# Patient Record
Sex: Male | Born: 1950
Health system: Southern US, Community
[De-identification: ages and names within clinical notes are randomized; demographics above are authoritative.]

## PROBLEM LIST (undated history)

## (undated) DIAGNOSIS — C801 Malignant (primary) neoplasm, unspecified: Secondary | ICD-10-CM

## (undated) DIAGNOSIS — R3915 Urgency of urination: Secondary | ICD-10-CM

## (undated) DIAGNOSIS — Z9189 Other specified personal risk factors, not elsewhere classified: Secondary | ICD-10-CM

## (undated) DIAGNOSIS — Z85528 Personal history of other malignant neoplasm of kidney: Secondary | ICD-10-CM

## (undated) DIAGNOSIS — N329 Bladder disorder, unspecified: Secondary | ICD-10-CM

## (undated) DIAGNOSIS — Z789 Other specified health status: Secondary | ICD-10-CM

## (undated) DIAGNOSIS — R319 Hematuria, unspecified: Secondary | ICD-10-CM

## (undated) HISTORY — PX: TIBIA FRACTURE SURGERY: SHX806

## (undated) MED FILL — Dexamethasone Sodium Phosphate Inj 100 MG/10ML: INTRAMUSCULAR | Qty: 1 | Status: AC

## (undated) MED FILL — Pegfilgrastim-pbbk Soln Prefilled Syringe 6 MG/0.6ML: SUBCUTANEOUS | Qty: 0.6 | Status: AC

---

## 1999-03-29 ENCOUNTER — Encounter: Payer: Self-pay | Admitting: Orthopedic Surgery

## 1999-03-29 ENCOUNTER — Inpatient Hospital Stay (HOSPITAL_COMMUNITY): Admission: EM | Admit: 1999-03-29 | Discharge: 1999-03-31 | Payer: Self-pay

## 1999-03-29 ENCOUNTER — Encounter: Payer: Self-pay | Admitting: Emergency Medicine

## 1999-06-21 ENCOUNTER — Encounter: Payer: Self-pay | Admitting: Nephrology

## 1999-06-21 ENCOUNTER — Encounter: Admission: RE | Admit: 1999-06-21 | Discharge: 1999-06-21 | Payer: Self-pay | Admitting: Nephrology

## 1999-09-06 ENCOUNTER — Inpatient Hospital Stay (HOSPITAL_COMMUNITY): Admission: RE | Admit: 1999-09-06 | Discharge: 1999-09-09 | Payer: Self-pay | Admitting: Urology

## 1999-12-28 ENCOUNTER — Encounter: Payer: Self-pay | Admitting: Urology

## 1999-12-28 ENCOUNTER — Encounter: Admission: RE | Admit: 1999-12-28 | Discharge: 1999-12-28 | Payer: Self-pay | Admitting: Urology

## 2001-07-28 ENCOUNTER — Encounter: Payer: Self-pay | Admitting: Urology

## 2001-07-28 ENCOUNTER — Encounter: Admission: RE | Admit: 2001-07-28 | Discharge: 2001-07-28 | Payer: Self-pay | Admitting: Urology

## 2001-09-02 HISTORY — PX: PARTIAL NEPHRECTOMY: SHX414

## 2002-07-26 ENCOUNTER — Encounter: Payer: Self-pay | Admitting: Urology

## 2002-07-26 ENCOUNTER — Encounter: Admission: RE | Admit: 2002-07-26 | Discharge: 2002-07-26 | Payer: Self-pay | Admitting: Urology

## 2003-05-16 ENCOUNTER — Encounter: Payer: Self-pay | Admitting: General Surgery

## 2003-05-16 ENCOUNTER — Encounter: Admission: RE | Admit: 2003-05-16 | Discharge: 2003-05-16 | Payer: Self-pay | Admitting: General Surgery

## 2012-06-23 ENCOUNTER — Encounter (INDEPENDENT_AMBULATORY_CARE_PROVIDER_SITE_OTHER): Payer: Self-pay | Admitting: Surgery

## 2012-06-23 ENCOUNTER — Encounter (INDEPENDENT_AMBULATORY_CARE_PROVIDER_SITE_OTHER): Payer: Self-pay

## 2012-06-23 ENCOUNTER — Ambulatory Visit (INDEPENDENT_AMBULATORY_CARE_PROVIDER_SITE_OTHER): Payer: 59 | Admitting: Surgery

## 2012-06-23 VITALS — BP 124/80 | HR 78 | Temp 98.0°F | Resp 18 | Ht 72.0 in | Wt 213.2 lb

## 2012-06-23 DIAGNOSIS — K602 Anal fissure, unspecified: Secondary | ICD-10-CM

## 2012-06-23 DIAGNOSIS — K649 Unspecified hemorrhoids: Secondary | ICD-10-CM

## 2012-06-23 DIAGNOSIS — K6289 Other specified diseases of anus and rectum: Secondary | ICD-10-CM

## 2012-06-23 DIAGNOSIS — G8929 Other chronic pain: Secondary | ICD-10-CM

## 2012-06-23 MED ORDER — POLYETHYLENE GLYCOL 3350 17 GM/SCOOP PO POWD: 17.0000 g | Freq: Every day | ORAL | Status: DC

## 2012-06-23 MED ORDER — OXYCODONE-ACETAMINOPHEN 5-325 MG PO TABS: 1.0000 | ORAL_TABLET | ORAL | Status: DC | PRN

## 2012-06-23 NOTE — Progress Notes (Signed)
Patient ID: Danny Wood, male   DOB: 04/25/1951, 60 y.o.   MRN: [id number]  Chief Complaint  Patient presents with  . Follow-up    hems    HPI Danny Wood is a 60 y.o. male.  Patient sent at the request of Dr.Swain do to severe perianal pain for 5 months. Over last 2 weeks the pain in his anal canal is increased and is worse after bowel movements with bleeding after bowel movements. He has had external hemorrhoids in the past and this was thought to be the cause. He had pain starting 5 months ago made worse with bowel movements with associated bleeding which at times is dark and "fills the commode" he has had a colonoscopy in the last 5 years which he states was normal. He has severe pain with wiping. He states the area was black and blue over the weekend. No fever chills or foul-smelling drainage. HPI  No past medical history on file.  Past Surgical History  Procedure Date  . Kidney surgery 2003    benign tumor removed    No family history on file.  Social History History  Substance Use Topics  . Smoking status: Never Smoker   . Smokeless tobacco: Not on file  . Alcohol Use: Yes    No Known Allergies  Current Outpatient Prescriptions  Medication Sig Dispense Refill  . minocycline (MINOCIN,DYNACIN) 100 MG capsule Take 100 mg by mouth 2 (two) times daily.      . oxyCODONE-acetaminophen (ROXICET) 5-325 MG per tablet Take 1 tablet by mouth every 4 (four) hours as needed for pain.  30 tablet  0  . polyethylene glycol powder (GLYCOLAX) powder Take 17 g by mouth daily.  255 g  0    Review of Systems Review of Systems  Constitutional: Negative.   HENT: Negative.   Eyes: Negative.   Respiratory: Negative.   Gastrointestinal: Positive for constipation, blood in stool and anal bleeding.  Genitourinary: Negative.   Musculoskeletal: Negative.   Neurological: Negative.   Hematological: Negative.   Psychiatric/Behavioral: Negative.     Blood pressure 124/80,  pulse 78, temperature 98 F (36.7 C), temperature source Oral, resp. rate 18, height 6' (1.829 m), weight 213 lb 3.2 oz (96.707 kg).  Physical Exam Physical Exam  Constitutional: He is oriented to person, place, and time. He appears well-developed and well-nourished.  HENT:  Head: Normocephalic and atraumatic.  Eyes: EOM are normal. Pupils are equal, round, and reactive to light.  Neck: Normal range of motion. Neck supple.  Cardiovascular: Normal rate and regular rhythm.   Pulmonary/Chest: Effort normal and breath sounds normal.  Abdominal: Soft. Bowel sounds are normal.  Genitourinary:     Neurological: He is alert and oriented to person, place, and time.  Skin: Skin is warm and dry.  Psychiatric: His behavior is normal. Judgment and thought content normal. His mood appears anxious.      Assessment    Anal pain nonthrombosed external hemorrhoids Anal fissure    Plan    EUA Possible hemorrhoidectomy Possible Lateral internal sphincterotomy The procedure has been discussed with the patient.  Alternative therapies have been discussed with the patient.  Operative risks include bleeding,  Infection,  Organ injury,  Nerve injury,  Blood vessel injury,  DVT,  Pulmonary embolism,  Death,  And possible reoperation.  Medical management risks include worsening of present situation.  The success of the procedure is 50 -90 % at treating patients symptoms.  Possible fecal incontinence.  The   patient understands and agrees to proceed.       Liberato Stansbery A. 06/23/2012, 3:29 PM    

## 2012-06-23 NOTE — Patient Instructions (Addendum)
Apply gel as directed.  Soak in Epsom salts and warm water 2 - 3 times a day.  Miralax daily .       Anal Fissure, Adult An anal fissure is a small tear or crack in the skin around the anus. Bleeding from a fissure usually stops on its own within a few minutes. However, bleeding will often reoccur with each bowel movement until the crack heals.  CAUSES   Passing large, hard stools.  Frequent diarrheal stools.  Constipation.  Inflammatory bowel disease (Crohn's disease or ulcerative colitis).  Infections.  Anal sex. SYMPTOMS   Small amounts of blood seen on your stools, on toilet paper, or in the toilet after a bowel movement.  Rectal bleeding.  Painful bowel movements.  Itching or irritation around the anus. DIAGNOSIS Your caregiver will examine the anal area. An anal fissure can usually be seen with careful inspection. A rectal exam may be performed and a short tube (anoscope) may be used to examine the anal canal. TREATMENT   You may be instructed to take fiber supplements. These supplements can soften your stool to help make bowel movements easier.  Sitz baths may be recommended to help heal the tear. Do not use soap in the sitz baths.  A medicated cream or ointment may be prescribed to lessen discomfort. HOME CARE INSTRUCTIONS   Maintain a diet high in fruits, whole grains, and vegetables. Avoid constipating foods like bananas and dairy products.  Take sitz baths as directed by your caregiver.  Drink enough fluids to keep your urine clear or pale yellow.  Only take over-the-counter or prescription medicines for pain, discomfort, or fever as directed by your caregiver. Do not take aspirin as this may increase bleeding.  Do not use ointments containing numbing medications (anesthetics) or hydrocortisone. They could slow healing. SEEK MEDICAL CARE IF:   Your fissure is not completely healed within 3 days.  You have further bleeding.  You have a fever.  You  have diarrhea mixed with blood.  You have pain.  Your problem is getting worse rather than better. MAKE SURE YOU:   Understand these instructions.  Will watch your condition.  Will get help right away if you are not doing well or get worse. Document Released: 08/19/2005 Document Revised: 11/11/2011 Document Reviewed: 02/03/2011 [facility] Patient Information 2013 [place], [place].

## 2012-06-26 ENCOUNTER — Encounter (HOSPITAL_COMMUNITY): Payer: Self-pay | Admitting: Pharmacy Technician

## 2012-07-03 ENCOUNTER — Ambulatory Visit (HOSPITAL_COMMUNITY)
Admission: RE | Admit: 2012-07-03 | Discharge: 2012-07-03 | Disposition: A | Discharge status: Home / Self Care | Payer: 59 | Source: Ambulatory Visit | Attending: Surgery | Admitting: Surgery

## 2012-07-03 ENCOUNTER — Encounter (HOSPITAL_COMMUNITY): Payer: Self-pay

## 2012-07-03 ENCOUNTER — Encounter (HOSPITAL_COMMUNITY)
Admission: RE | Admit: 2012-07-03 | Discharge: 2012-07-03 | Disposition: A | Discharge status: Home / Self Care | Payer: 59 | Source: Ambulatory Visit | Attending: Surgery | Admitting: Surgery

## 2012-07-03 DIAGNOSIS — Z01818 Encounter for other preprocedural examination: Secondary | ICD-10-CM | POA: Insufficient documentation

## 2012-07-03 LAB — CBC WITH DIFFERENTIAL/PLATELET
Basophils Absolute: 0 10*3/uL (ref 0.0–0.1)
Basophils Relative: 0 % (ref 0–1)
Eosinophils Absolute: 0.3 10*3/uL (ref 0.0–0.7)
Eosinophils Relative: 3 % (ref 0–5)
HCT: 44.4 % (ref 39.0–52.0)
Hemoglobin: 16 g/dL (ref 13.0–17.0)
Lymphocytes Relative: 38 % (ref 12–46)
Lymphs Abs: 2.9 10*3/uL (ref 0.7–4.0)
MCH: 30.3 pg (ref 26.0–34.0)
MCHC: 36 g/dL (ref 30.0–36.0)
MCV: 84.1 fL (ref 78.0–100.0)
Monocytes Absolute: 0.7 10*3/uL (ref 0.1–1.0)
Monocytes Relative: 9 % (ref 3–12)
Neutro Abs: 3.7 10*3/uL (ref 1.7–7.7)
Neutrophils Relative %: 49 % (ref 43–77)
Platelets: 186 10*3/uL (ref 150–400)
RBC: 5.28 MIL/uL (ref 4.22–5.81)
RDW: 12.7 % (ref 11.5–15.5)
WBC: 7.5 10*3/uL (ref 4.0–10.5)

## 2012-07-03 LAB — COMPREHENSIVE METABOLIC PANEL
ALT: 45 U/L (ref 0–53)
AST: 35 U/L (ref 0–37)
Albumin: 3.8 g/dL (ref 3.5–5.2)
Alkaline Phosphatase: 86 U/L (ref 39–117)
BUN: 21 mg/dL (ref 6–23)
CO2: 29 mEq/L (ref 19–32)
Calcium: 9.1 mg/dL (ref 8.4–10.5)
Chloride: 102 mEq/L (ref 96–112)
Creatinine, Ser: 0.95 mg/dL (ref 0.50–1.35)
GFR calc Af Amer: >90 mL/min (ref >90)
GFR calc non Af Amer: 89 mL/min — ABNORMAL LOW (ref >90)
Glucose, Bld: 101 mg/dL — ABNORMAL HIGH (ref 70–99)
Potassium: 4.2 mEq/L (ref 3.5–5.1)
Sodium: 138 mEq/L (ref 135–145)
Total Bilirubin: 0.4 mg/dL (ref 0.3–1.2)
Total Protein: 7 g/dL (ref 6.0–8.3)

## 2012-07-03 LAB — SURGICAL PCR SCREEN
MRSA, PCR: NEGATIVE
Staphylococcus aureus: NEGATIVE

## 2012-07-03 NOTE — Pre-Procedure Instructions (Signed)
20 Danny Wood   07/03/2012   Your procedure is scheduled on:  Tuesday, November 5th   Report to [facility] at 2:00 PM  Call this number if you have problems the morning of surgery: [phone]   Remember:   Do not eat food or drink ANY liquids:After Midnight Monday.  Take these medicines the morning of surgery with A SIP OF WATER:  Roxicet   Do not wear jewelry.  Do not wear lotions, powders, or colognes . You may NOT wear deodorant.   Men may shave face and neck.   Do not bring valuables to the hospital.  Contacts, dentures or bridgework may not be worn into surgery.   Leave suitcase in the car. After surgery it may be brought to your room.  For patients admitted to the hospital, checkout time is 11:00 AM the day of discharge.   Patients discharged the day of surgery will not be allowed to drive home.   Name and phone number of your driver:    Special Instructions: Shower using CHG 2 nights before surgery and the night before surgery.  If you shower the day of surgery use CHG.  Use special wash - you have one bottle of CHG for all showers.  You should use approximately 1/3 of the bottle for each shower.   Please read over the following fact sheets that you were given: Pain Booklet, Coughing and Deep Breathing, MRSA Information and Surgical Site Infection Prevention

## 2012-07-06 MED ORDER — DEXTROSE 5 % IV SOLN
3.0000 g | INTRAVENOUS | Status: AC
  Administered 2012-07-07: 3 g via INTRAVENOUS
  Filled 2012-07-06: qty 3000

## 2012-07-07 ENCOUNTER — Encounter (HOSPITAL_COMMUNITY): Payer: Self-pay | Admitting: Anesthesiology

## 2012-07-07 ENCOUNTER — Encounter (HOSPITAL_COMMUNITY): Payer: Self-pay | Admitting: *Deleted

## 2012-07-07 ENCOUNTER — Encounter (HOSPITAL_COMMUNITY)
Admission: RE | Disposition: A | Discharge status: Home / Self Care | Payer: Self-pay | Source: Ambulatory Visit | Attending: Surgery

## 2012-07-07 ENCOUNTER — Ambulatory Visit (HOSPITAL_COMMUNITY): Payer: 59 | Admitting: Anesthesiology

## 2012-07-07 ENCOUNTER — Ambulatory Visit (HOSPITAL_COMMUNITY)
Admission: RE | Admit: 2012-07-07 | Discharge: 2012-07-07 | Disposition: A | Discharge status: Home / Self Care | Payer: 59 | Source: Ambulatory Visit | Attending: Surgery | Admitting: Surgery

## 2012-07-07 DIAGNOSIS — K5900 Constipation, unspecified: Secondary | ICD-10-CM | POA: Insufficient documentation

## 2012-07-07 DIAGNOSIS — Z9889 Other specified postprocedural states: Secondary | ICD-10-CM

## 2012-07-07 DIAGNOSIS — K644 Residual hemorrhoidal skin tags: Secondary | ICD-10-CM

## 2012-07-07 DIAGNOSIS — K648 Other hemorrhoids: Secondary | ICD-10-CM

## 2012-07-07 DIAGNOSIS — K602 Anal fissure, unspecified: Secondary | ICD-10-CM | POA: Insufficient documentation

## 2012-07-07 HISTORY — PX: EVALUATION UNDER ANESTHESIA WITH HEMORRHOIDECTOMY: SHX5624

## 2012-07-07 SURGERY — EXAM UNDER ANESTHESIA WITH HEMORRHOIDECTOMY
Anesthesia: General | Site: Rectum | Wound class: Clean Contaminated

## 2012-07-07 MED ORDER — HEMOSTATIC AGENTS (NO CHARGE) OPTIME
TOPICAL | Status: DC | PRN
  Administered 2012-07-07: 1 via TOPICAL

## 2012-07-07 MED ORDER — OXYCODONE HCL 5 MG PO TABS: 5.0000 mg | ORAL_TABLET | Freq: Once | ORAL | Status: DC | PRN

## 2012-07-07 MED ORDER — ACETAMINOPHEN 650 MG RE SUPP: 650.0000 mg | RECTAL | Status: DC | PRN

## 2012-07-07 MED ORDER — ONDANSETRON HCL 4 MG/2ML IJ SOLN
INTRAMUSCULAR | Status: DC | PRN
  Administered 2012-07-07: 4 mg via INTRAVENOUS

## 2012-07-07 MED ORDER — OXYCODONE-ACETAMINOPHEN 5-325 MG PO TABS: 2.0000 | ORAL_TABLET | ORAL | Status: DC | PRN

## 2012-07-07 MED ORDER — SODIUM CHLORIDE 0.9 % IV SOLN: 250.0000 mL | INTRAVENOUS | Status: DC | PRN

## 2012-07-07 MED ORDER — FENTANYL CITRATE 0.05 MG/ML IJ SOLN
INTRAMUSCULAR | Status: DC | PRN
  Administered 2012-07-07 (×5): 50 ug via INTRAVENOUS

## 2012-07-07 MED ORDER — PROMETHAZINE HCL 25 MG/ML IJ SOLN: 6.2500 mg | INTRAMUSCULAR | Status: DC | PRN

## 2012-07-07 MED ORDER — MIDAZOLAM HCL 5 MG/5ML IJ SOLN
INTRAMUSCULAR | Status: DC | PRN
  Administered 2012-07-07: 2 mg via INTRAVENOUS

## 2012-07-07 MED ORDER — LIDOCAINE HCL 2 % EX GEL
CUTANEOUS | Status: DC | PRN
  Administered 2012-07-07: 1 via TOPICAL

## 2012-07-07 MED ORDER — LIDOCAINE HCL 2 % EX GEL: CUTANEOUS | Status: DC | PRN

## 2012-07-07 MED ORDER — LIDOCAINE HCL 2 % EX GEL
CUTANEOUS | Status: AC
  Filled 2012-07-07: qty 20

## 2012-07-07 MED ORDER — LIDOCAINE HCL (CARDIAC) 20 MG/ML IV SOLN
INTRAVENOUS | Status: DC | PRN
  Administered 2012-07-07: 20 mg via INTRAVENOUS

## 2012-07-07 MED ORDER — SODIUM CHLORIDE 0.9 % IJ SOLN
INTRAMUSCULAR | Status: DC | PRN
  Administered 2012-07-07 (×2): 10 mL

## 2012-07-07 MED ORDER — SODIUM CHLORIDE 0.9 % IJ SOLN: 3.0000 mL | Freq: Two times a day (BID) | INTRAMUSCULAR | Status: DC

## 2012-07-07 MED ORDER — SODIUM CHLORIDE 0.9 % IJ SOLN: 3.0000 mL | INTRAMUSCULAR | Status: DC | PRN

## 2012-07-07 MED ORDER — BUPIVACAINE LIPOSOME 1.3 % IJ SUSP
20.0000 mL | Freq: Once | INTRAMUSCULAR | Status: AC
  Administered 2012-07-07: 40 mL
  Filled 2012-07-07: qty 20

## 2012-07-07 MED ORDER — MIDAZOLAM HCL 2 MG/2ML IJ SOLN: 0.5000 mg | Freq: Once | INTRAMUSCULAR | Status: DC | PRN

## 2012-07-07 MED ORDER — HYDROMORPHONE HCL PF 1 MG/ML IJ SOLN: 0.2500 mg | INTRAMUSCULAR | Status: DC | PRN

## 2012-07-07 MED ORDER — ACETAMINOPHEN 325 MG PO TABS: 650.0000 mg | ORAL_TABLET | ORAL | Status: DC | PRN

## 2012-07-07 MED ORDER — LACTATED RINGERS IV SOLN
INTRAVENOUS | Status: DC
  Administered 2012-07-07: 13:00:00 via INTRAVENOUS

## 2012-07-07 MED ORDER — CHLORHEXIDINE GLUCONATE 4 % EX LIQD: 1.0000 "application " | Freq: Once | CUTANEOUS | Status: DC

## 2012-07-07 MED ORDER — MEPERIDINE HCL 25 MG/ML IJ SOLN: 6.2500 mg | INTRAMUSCULAR | Status: DC | PRN

## 2012-07-07 MED ORDER — ONDANSETRON HCL 4 MG/2ML IJ SOLN: 4.0000 mg | Freq: Four times a day (QID) | INTRAMUSCULAR | Status: DC | PRN

## 2012-07-07 MED ORDER — POLYETHYLENE GLYCOL 3350 17 GM/SCOOP PO POWD: 17.0000 g | Freq: Every day | ORAL | Status: DC

## 2012-07-07 MED ORDER — OXYCODONE HCL 5 MG/5ML PO SOLN: 5.0000 mg | Freq: Once | ORAL | Status: DC | PRN

## 2012-07-07 MED ORDER — 0.9 % SODIUM CHLORIDE (POUR BTL) OPTIME
TOPICAL | Status: DC | PRN
  Administered 2012-07-07: 1000 mL

## 2012-07-07 MED ORDER — PROPOFOL 10 MG/ML IV BOLUS
INTRAVENOUS | Status: DC | PRN
  Administered 2012-07-07: 200 mg via INTRAVENOUS

## 2012-07-07 MED ORDER — ACETAMINOPHEN 10 MG/ML IV SOLN: 1000.0000 mg | Freq: Four times a day (QID) | INTRAVENOUS | Status: DC

## 2012-07-07 MED ORDER — MORPHINE SULFATE 2 MG/ML IJ SOLN: 2.0000 mg | INTRAMUSCULAR | Status: DC | PRN

## 2012-07-07 MED ORDER — OXYCODONE HCL 5 MG PO TABS: 5.0000 mg | ORAL_TABLET | ORAL | Status: DC | PRN

## 2012-07-07 MED ORDER — KETOROLAC TROMETHAMINE 30 MG/ML IJ SOLN: 30.0000 mg | Freq: Four times a day (QID) | INTRAMUSCULAR | Status: DC

## 2012-07-07 MED ORDER — LACTATED RINGERS IV SOLN
INTRAVENOUS | Status: DC | PRN
  Administered 2012-07-07 (×2): via INTRAVENOUS

## 2012-07-07 SURGICAL SUPPLY — 41 items
BLADE SURG 11 STRL SS (BLADE) ×2 IMPLANT
BLADE SURG 15 STRL LF DISP TIS (BLADE) ×1 IMPLANT
BLADE SURG 15 STRL SS (BLADE) ×2
CANISTER SUCTION 2500CC (MISCELLANEOUS) ×2 IMPLANT
CLOTH BEACON ORANGE TIMEOUT ST (SAFETY) ×2 IMPLANT
CONT SPEC 4OZ CLIKSEAL STRL BL (MISCELLANEOUS) ×2 IMPLANT
COVER SURGICAL LIGHT HANDLE (MISCELLANEOUS) ×2 IMPLANT
DRAPE PROXIMA HALF (DRAPES) ×2 IMPLANT
DRAPE UTILITY 15X26 W/TAPE STR (DRAPE) ×6 IMPLANT
DRSG PAD ABDOMINAL 8X10 ST (GAUZE/BANDAGES/DRESSINGS) ×4 IMPLANT
ELECT CAUTERY BLADE 6.4 (BLADE) ×2 IMPLANT
ELECT REM PT RETURN 9FT ADLT (ELECTROSURGICAL) ×2
ELECTRODE REM PT RTRN 9FT ADLT (ELECTROSURGICAL) ×1 IMPLANT
GAUZE SPONGE 4X4 16PLY XRAY LF (GAUZE/BANDAGES/DRESSINGS) ×2 IMPLANT
GLOVE BIO SURGEON STRL SZ8 (GLOVE) ×2 IMPLANT
GLOVE BIOGEL PI IND STRL 8 (GLOVE) ×1 IMPLANT
GLOVE BIOGEL PI INDICATOR 8 (GLOVE) ×1
GOWN STRL NON-REIN LRG LVL3 (GOWN DISPOSABLE) ×4 IMPLANT
KIT BASIN OR (CUSTOM PROCEDURE TRAY) ×2 IMPLANT
KIT ROOM TURNOVER OR (KITS) ×2 IMPLANT
NEEDLE HYPO 25GX1X1/2 BEV (NEEDLE) ×2 IMPLANT
NS IRRIG 1000ML POUR BTL (IV SOLUTION) ×2 IMPLANT
PACK LITHOTOMY IV (CUSTOM PROCEDURE TRAY) ×2 IMPLANT
PAD ARMBOARD 7.5X6 YLW CONV (MISCELLANEOUS) ×2 IMPLANT
PENCIL BUTTON HOLSTER BLD 10FT (ELECTRODE) ×2 IMPLANT
SHEARS HARMONIC 9CM CVD (BLADE) ×2 IMPLANT
SPONGE GAUZE 4X4 12PLY (GAUZE/BANDAGES/DRESSINGS) ×4 IMPLANT
SPONGE HEMORRHOID 8X3CM (HEMOSTASIS) ×2 IMPLANT
SPONGE SURGIFOAM ABS GEL 12-7 (HEMOSTASIS) IMPLANT
SURGILUBE 2OZ TUBE FLIPTOP (MISCELLANEOUS) ×2 IMPLANT
SUT MON AB 3-0 SH 27 (SUTURE) ×1
SUT MON AB 3-0 SH27 (SUTURE) ×1 IMPLANT
SYR BULB 3OZ (MISCELLANEOUS) ×2 IMPLANT
SYR CONTROL 10ML LL (SYRINGE) ×2 IMPLANT
TAPE PAPER 2X10 WHT MICROPORE (GAUZE/BANDAGES/DRESSINGS) ×2 IMPLANT
TOWEL OR 17X24 6PK STRL BLUE (TOWEL DISPOSABLE) ×2 IMPLANT
TOWEL OR 17X26 10 PK STRL BLUE (TOWEL DISPOSABLE) ×2 IMPLANT
TUBE CONNECTING 12X1/4 (SUCTIONS) ×2 IMPLANT
UNDERPAD 30X30 INCONTINENT (UNDERPADS AND DIAPERS) ×2 IMPLANT
WATER STERILE IRR 1000ML POUR (IV SOLUTION) ×2 IMPLANT
YANKAUER SUCT BULB TIP NO VENT (SUCTIONS) ×2 IMPLANT

## 2012-07-07 NOTE — Anesthesia Procedure Notes (Signed)
Procedure Name: LMA Insertion Date/Time: 07/07/2012 1:19 PM Performed by: Romie Minus K Pre-anesthesia Checklist: Patient identified, Emergency Drugs available, Suction available, Patient being monitored and Timeout performed Patient Re-evaluated:Patient Re-evaluated prior to inductionOxygen Delivery Method: Circle system utilized Preoxygenation: Pre-oxygenation with 100% oxygen Intubation Type: IV induction Ventilation: Mask ventilation without difficulty LMA: LMA inserted LMA Size: 5.0 Number of attempts: 1 Placement Confirmation: positive ETCO2 and breath sounds checked- equal and bilateral Tube secured with: Tape Dental Injury: Teeth and Oropharynx as per pre-operative assessment

## 2012-07-07 NOTE — H&P (View-Only) (Signed)
Patient ID: Danny Wood, male   DOB: 12/23/1950, 61 y.o.   MRN: [id number]  Chief Complaint  Patient presents with  . Follow-up    hems    HPI Danny Wood is a 61 y.o. male.  Patient sent at the request of Dr.Swain do to severe perianal pain for 5 months. Over last 2 weeks the pain in his anal canal is increased and is worse after bowel movements with bleeding after bowel movements. He has had external hemorrhoids in the past and this was thought to be the cause. He had pain starting 5 months ago made worse with bowel movements with associated bleeding which at times is dark and "fills the commode" he has had a colonoscopy in the last 5 years which he states was normal. He has severe pain with wiping. He states the area was black and blue over the weekend. No fever chills or foul-smelling drainage. HPI  No past medical history on file.  Past Surgical History  Procedure Date  . Kidney surgery 2003    benign tumor removed    No family history on file.  Social History History  Substance Use Topics  . Smoking status: Never Smoker   . Smokeless tobacco: Not on file  . Alcohol Use: Yes    No Known Allergies  Current Outpatient Prescriptions  Medication Sig Dispense Refill  . minocycline (MINOCIN,DYNACIN) 100 MG capsule Take 100 mg by mouth 2 (two) times daily.      Marland Kitchen oxyCODONE-acetaminophen (ROXICET) 5-325 MG per tablet Take 1 tablet by mouth every 4 (four) hours as needed for pain.  30 tablet  0  . polyethylene glycol powder (GLYCOLAX) powder Take 17 g by mouth daily.  255 g  0    Review of Systems Review of Systems  Constitutional: Negative.   HENT: Negative.   Eyes: Negative.   Respiratory: Negative.   Gastrointestinal: Positive for constipation, blood in stool and anal bleeding.  Genitourinary: Negative.   Musculoskeletal: Negative.   Neurological: Negative.   Hematological: Negative.   Psychiatric/Behavioral: Negative.     Blood pressure 124/80,  pulse 78, temperature 98 F (36.7 C), temperature source Oral, resp. rate 18, height 6' (1.829 m), weight 213 lb 3.2 oz (96.707 kg).  Physical Exam Physical Exam  Constitutional: He is oriented to person, place, and time. He appears well-developed and well-nourished.  HENT:  Head: Normocephalic and atraumatic.  Eyes: EOM are normal. Pupils are equal, round, and reactive to light.  Neck: Normal range of motion. Neck supple.  Cardiovascular: Normal rate and regular rhythm.   Pulmonary/Chest: Effort normal and breath sounds normal.  Abdominal: Soft. Bowel sounds are normal.  Genitourinary:     Neurological: He is alert and oriented to person, place, and time.  Skin: Skin is warm and dry.  Psychiatric: His behavior is normal. Judgment and thought content normal. His mood appears anxious.      Assessment    Anal pain nonthrombosed external hemorrhoids Anal fissure    Plan    EUA Possible hemorrhoidectomy Possible Lateral internal sphincterotomy The procedure has been discussed with the patient.  Alternative therapies have been discussed with the patient.  Operative risks include bleeding,  Infection,  Organ injury,  Nerve injury,  Blood vessel injury,  DVT,  Pulmonary embolism,  Death,  And possible reoperation.  Medical management risks include worsening of present situation.  The success of the procedure is 50 -90 % at treating patients symptoms.  Possible fecal incontinence.  The  patient understands and agrees to proceed.       Danny Wood A. 06/23/2012, 3:29 PM

## 2012-07-07 NOTE — Anesthesia Preprocedure Evaluation (Addendum)
Anesthesia Evaluation  Patient identified by MRN, date of birth, ID band Patient awake    Reviewed: Allergy & Precautions, H&P , NPO status , Patient's Chart, lab work & pertinent test results  History of Anesthesia Complications Negative for: history of anesthetic complications  Airway Mallampati: II TM Distance: >3 FB Neck ROM: Full    Dental No notable dental hx. (+) Teeth Intact and Dental Advisory Given   Pulmonary neg pulmonary ROS, former smoker,  breath sounds clear to auscultation  Pulmonary exam normal       Cardiovascular negative cardio ROS  Rhythm:Regular Rate:Normal     Neuro/Psych negative neurological ROS  negative psych ROS   GI/Hepatic negative GI ROS, Neg liver ROS,   Endo/Other  negative endocrine ROS  Renal/GU negative Renal ROSCyst on right - removed 2003     Musculoskeletal   Abdominal   Peds  Hematology   Anesthesia Other Findings   Reproductive/Obstetrics                          Anesthesia Physical Anesthesia Plan  ASA: I  Anesthesia Plan: General   Post-op Pain Management:    Induction: Intravenous  Airway Management Planned: LMA  Additional Equipment:   Intra-op Plan:   Post-operative Plan:   Informed Consent: I have reviewed the patients History and Physical, chart, labs and discussed the procedure including the risks, benefits and alternatives for the proposed anesthesia with the patient or authorized representative who has indicated his/her understanding and acceptance.   Dental advisory given  Plan Discussed with: CRNA and Surgeon  Anesthesia Plan Comments: (Plan routine monitors, GA- LMA OK)        Anesthesia Quick Evaluation

## 2012-07-07 NOTE — Preoperative (Signed)
Beta Blockers   Reason not to administer Beta Blockers:Not Applicable 

## 2012-07-07 NOTE — Anesthesia Postprocedure Evaluation (Signed)
  Anesthesia Post-op Note  Patient: Danny Wood  Procedure(s) Performed: Procedure(s) (LRB) with comments: EXAM UNDER ANESTHESIA WITH HEMORRHOIDECTOMY (N/A)  Patient Location: PACU  Anesthesia Type:General  Level of Consciousness: awake, alert , oriented and patient cooperative  Airway and Oxygen Therapy: Patient Spontanous Breathing  Post-op Pain: mild  Post-op Assessment: Post-op Vital signs reviewed, Patient's Cardiovascular Status Stable, Respiratory Function Stable, Patent Airway, No signs of Nausea or vomiting and Pain level controlled  Post-op Vital Signs: Reviewed and stable  Complications: No apparent anesthesia complications

## 2012-07-07 NOTE — Interval H&P Note (Signed)
History and Physical Interval Note:  07/07/2012 12:31 PM  Danny Wood  has presented today for surgery, with the diagnosis of hemorrhoids  The various methods of treatment have been discussed with the patient and family. After consideration of risks, benefits and other options for treatment, the patient has consented to  Procedure(s) (LRB) with comments: EXAM UNDER ANESTHESIA WITH HEMORRHOIDECTOMY (N/A) - Exam Under Anesthesia, possible hemorrhoidectomy, possible sphincterotomy as a surgical intervention .  The patient's history has been reviewed, patient examined, no change in status, stable for surgery.  I have reviewed the patient's chart and labs.  Questions were answered to the patient's satisfaction.     Louann Hopson A.

## 2012-07-07 NOTE — Op Note (Signed)
Preoperative diagnosis: Grade 3 prolapsed internal hemorrhoids 2 columns left lateral and right posterior  Postoperative diagnosis: Same  Procedure: 2 column internal and external hemorrhoidectomy  Surgeon: Harriette Bouillon M.D.  Anesthesia: LMA with local anesthesia Exparel  EBL: Less than 50 cc  Specimens: Hemorrhoid tissue  Drains: None  IV fluids:  Indications for procedure: The patient presents with symptomatic hemorrhoid disease. Options of treatment including medical management, office-based procedures and surgical modalities. All have been discussed with the patient. The patient is failed medical management. They wish to proceed with formal hemorrhoid ectomy. Risks, benefits and all alternative therapies with complications of long term expectations discussed. They agree to proceed with surgery.  Description of procedure: The patient was met in the holding area and questions are answered. The patient was taken back to the operating room and placed upon the operating room table. After induction of LMA anesthesia, the patient was placed lithotomy and appropriate the padded. The anal canal and perineum were prepped and draped in a sterile fashion. Timeout was done and the patient received preoperative. Harmonic scalpel was used after digital examination to excise left lateral and right posterior  hemorrhoid tissue. Internal sphincter was preserved. The posterior reapproximated with 3-0 Monocryl. All 2 columns were treated in a similar fashion. Hemostasis achieved. Irrigation used. No significant narrowing of the anal canal noted.  Anal block with Exparel performed. Surgicel wrapped around Gelfoam used packing. Local anesthesia infiltrated for perianal block. All final counts are found to be correct. The patient was taken to lithotomy, extubated and transported to recovery in satisfactory condition.

## 2012-07-07 NOTE — Transfer of Care (Signed)
Immediate Anesthesia Transfer of Care Note  Patient: Danny Wood  Procedure(s) Performed: Procedure(s) (LRB) with comments: EXAM UNDER ANESTHESIA WITH HEMORRHOIDECTOMY (N/A)  Patient Location: PACU  Anesthesia Type:General  Level of Consciousness: awake, alert  and oriented  Airway & Oxygen Therapy: Patient Spontanous Breathing and Patient connected to nasal cannula oxygen  Post-op Assessment: Report given to PACU RN and Post -op Vital signs reviewed and stable  Post vital signs: Reviewed  Complications: No apparent anesthesia complications

## 2012-07-08 ENCOUNTER — Encounter (HOSPITAL_COMMUNITY): Payer: Self-pay | Admitting: Surgery

## 2012-07-27 ENCOUNTER — Ambulatory Visit (INDEPENDENT_AMBULATORY_CARE_PROVIDER_SITE_OTHER): Payer: 59 | Admitting: Surgery

## 2012-07-27 ENCOUNTER — Encounter (INDEPENDENT_AMBULATORY_CARE_PROVIDER_SITE_OTHER): Payer: Self-pay | Admitting: Surgery

## 2012-07-27 VITALS — BP 138/82 | HR 76 | Temp 98.0°F | Resp 18 | Ht 72.0 in | Wt 212.4 lb

## 2012-07-27 DIAGNOSIS — Z9889 Other specified postprocedural states: Secondary | ICD-10-CM

## 2012-07-27 MED ORDER — OXYCODONE-ACETAMINOPHEN 5-325 MG PO TABS: 2.0000 | ORAL_TABLET | ORAL | Status: DC | PRN

## 2012-07-27 NOTE — Progress Notes (Signed)
Patient returns with hemorrhoidectomy and sphincterotomy. He does have some issues with drainage and moisture. He is quite sore but seems to be getting better slowly.  Exam: Anal canal shows some moisture and irritation. No signs of infection.  Impression: Status post hemorrhoidectomy and lateral internal sphincterotomy  Plan: Recommend adding powder to the area to help reduce moisture. Return to clinic 3-4 weeks.

## 2012-07-27 NOTE — Patient Instructions (Signed)
Try powder for moisture.  Avoid prolonged sitting on commode.

## 2012-08-21 ENCOUNTER — Encounter (INDEPENDENT_AMBULATORY_CARE_PROVIDER_SITE_OTHER): Payer: Self-pay | Admitting: Surgery

## 2012-08-21 ENCOUNTER — Ambulatory Visit (INDEPENDENT_AMBULATORY_CARE_PROVIDER_SITE_OTHER): Payer: 59 | Admitting: Surgery

## 2012-08-21 VITALS — BP 132/76 | HR 74 | Temp 98.3°F | Resp 16 | Ht 72.0 in | Wt 214.2 lb

## 2012-08-21 DIAGNOSIS — Z9889 Other specified postprocedural states: Secondary | ICD-10-CM

## 2012-08-21 NOTE — Progress Notes (Signed)
Patient returns with hemorrhoidectomy and sphincterotomy. He does have some issues with drainage and moisture. He is quite sore but seems to be getting better slowly. Right shoulder pain.    Exam: Anal canal shows some moisture and irritation. No signs of infection. Right shoulder crepitence and pain.  Impression: Status post hemorrhoidectomy and lateral internal sphincterotomy  Plan: anorectal issues resolved.  Recommend ortho consult for shoulder.  High fiber diet.  Return prn

## 2012-08-21 NOTE — Patient Instructions (Signed)
Talk to orthopedist about shoulder.  Return as needed.  High fiber diet.     High-Fiber Diet Fiber is found in fruits, vegetables, and grains. A high-fiber diet encourages the addition of more whole grains, legumes, fruits, and vegetables in your diet. The recommended amount of fiber for adult males is 38 g per day. For adult females, it is 25 g per day. Pregnant and lactating women should get 28 g of fiber per day. If you have a digestive or bowel problem, ask your caregiver for advice before adding high-fiber foods to your diet. Eat a variety of high-fiber foods instead of only a select few type of foods.  PURPOSE  To increase stool bulk.  To make bowel movements more regular to prevent constipation.  To lower cholesterol.  To prevent overeating. WHEN IS THIS DIET USED?  It may be used if you have constipation and hemorrhoids.  It may be used if you have uncomplicated diverticulosis (intestine condition) and irritable bowel syndrome.  It may be used if you need help with weight management.  It may be used if you want to add it to your diet as a protective measure against atherosclerosis, diabetes, and cancer. SOURCES OF FIBER  Whole-grain breads and cereals.  Fruits, such as apples, oranges, bananas, berries, prunes, and pears.  Vegetables, such as green peas, carrots, sweet potatoes, beets, broccoli, cabbage, spinach, and artichokes.  Legumes, such split peas, soy, lentils.  Almonds. FIBER CONTENT IN FOODS Starches and Grains / Dietary Fiber (g)  Cheerios, 1 cup / 3 g  Corn Flakes cereal, 1 cup / 0.7 g  Rice crispy treat cereal, 1 cup / 0.3 g  Instant oatmeal (cooked),  cup / 2 g  Frosted wheat cereal, 1 cup / 5.1 g  Brown, long-grain rice (cooked), 1 cup / 3.5 g  White, long-grain rice (cooked), 1 cup / 0.6 g  Enriched macaroni (cooked), 1 cup / 2.5 g Legumes / Dietary Fiber (g)  Baked beans (canned, plain, or vegetarian),  cup / 5.2 g  Kidney beans  (canned),  cup / 6.8 g  Pinto beans (cooked),  cup / 5.5 g Breads and Crackers / Dietary Fiber (g)  Plain or honey graham crackers, 2 squares / 0.7 g  Saltine crackers, 3 squares / 0.3 g  Plain, salted pretzels, 10 pieces / 1.8 g  Whole-wheat bread, 1 slice / 1.9 g  White bread, 1 slice / 0.7 g  Raisin bread, 1 slice / 1.2 g  Plain bagel, 3 oz / 2 g  Flour tortilla, 1 oz / 0.9 g  Corn tortilla, 1 small / 1.5 g  Hamburger or hotdog bun, 1 small / 0.9 g Fruits / Dietary Fiber (g)  Apple with skin, 1 medium / 4.4 g  Sweetened applesauce,  cup / 1.5 g  Banana,  medium / 1.5 g  Grapes, 10 grapes / 0.4 g  Orange, 1 small / 2.3 g  Raisin, 1.5 oz / 1.6 g  Melon, 1 cup / 1.4 g Vegetables / Dietary Fiber (g)  Green beans (canned),  cup / 1.3 g  Carrots (cooked),  cup / 2.3 g  Broccoli (cooked),  cup / 2.8 g  Peas (cooked),  cup / 4.4 g  Mashed potatoes,  cup / 1.6 g  Lettuce, 1 cup / 0.5 g  Corn (canned),  cup / 1.6 g  Tomato,  cup / 1.1 g Document Released: 08/19/2005 Document Revised: 02/18/2012 Document Reviewed: 11/21/2011 [employer] Patient Information 2013 [employer]  

## 2014-05-05 ENCOUNTER — Other Ambulatory Visit: Payer: Self-pay | Admitting: Urology

## 2014-05-11 ENCOUNTER — Encounter (HOSPITAL_BASED_OUTPATIENT_CLINIC_OR_DEPARTMENT_OTHER): Payer: Self-pay | Admitting: *Deleted

## 2014-05-11 HISTORY — PX: LUMBAR MICRODISCECTOMY: SHX99

## 2014-05-16 ENCOUNTER — Encounter (HOSPITAL_BASED_OUTPATIENT_CLINIC_OR_DEPARTMENT_OTHER): Payer: Self-pay | Admitting: Anesthesiology

## 2014-05-16 NOTE — H&P (Signed)
Active Problems Problems  1. Bladder disorder (596.9) 2. 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 Erectile dysfunction due to arterial insufficiency (607.84) 4. Gross hematuria (599.71) 5. H/O renal cell carcinoma (V10.52) 6. Lumbago (724.2) 7. Prostate cancer screening (V76.44)  History of Present Illness Danny Wood returns today in f/u for recent hematuria. He had a 2-3 day history of passage of flecks of clot and terminal hematuria a few weeks ago.  He had dysuria at the time but none since.  He has 11-20 RBC's on UA today. He has increased irritative voiding symptoms over the past year.  He is going to have back surgery next week. His CT showed bladder wall thickening that is asymmetric.   Past Medical History Problems  1. History of Disc degeneration, lumbar (722.52) 2. H/O renal cell carcinoma (V10.52)  Surgical History Problems  1. History of Kidney Surgery  Current Meds 1. Percocet TABS;  Therapy: (Recorded:08May2013) to Recorded  Allergies Medication  1. No Known Drug Allergies  Family History Problems  1. Family history of Family Health Status Number Of Children : Father   1 son, 1 daughter 2. Family history of congestive heart failure (V17.49) : Mother   died age 95 3. Family history of Malignant Lymphoma (V16.7) : Father   died age 61  Social History Problems  1. Alcohol Use   2 beers/month 2. Caffeine Use   coffee 1-2/day 3. Former smoker (V15.82) 4. Marital History - Currently Married 5. Occupation:   Optometrist 6. History of Smoking Cigarettes For ____ Pack-years (V15.82)   smoked 8-10 cig/day for 25 years.  quit 1 year ago  Past and social history reviewed and updated.   Review of Systems  Genitourinary: urinary frequency, feelings of urinary urgency, dysuria and nocturia (4-5 chronic for at least a year).  Gastrointestinal: no abdominal pain.  Constitutional: no recent weight loss.    Vitals Vital Signs [Data Includes: Last 1 Day]   Recorded: 03Sep2015 01:49PM  Blood Pressure: 138 / 76 Temperature: 98.1 F Heart Rate: 55  Physical Exam Constitutional: Well nourished and well developed . No acute distress.  Pulmonary: No respiratory distress and normal respiratory rhythm and effort.  Cardiovascular: Heart rate and rhythm are normal . No peripheral edema.    Results/Data Urine [Data Includes: Last 1 Day]   03Sep2015  COLOR YELLOW   APPEARANCE CLEAR   SPECIFIC GRAVITY 1.025   pH 5.5   GLUCOSE NEG mg/dL  BILIRUBIN NEG   KETONE NEG mg/dL  BLOOD LARGE   PROTEIN TRACE mg/dL  UROBILINOGEN 0.2 mg/dL  NITRITE NEG   LEUKOCYTE ESTERASE NEG   SQUAMOUS EPITHELIAL/HPF FEW   WBC NONE SEEN WBC/hpf  RBC 11-20 RBC/hpf  BACTERIA NONE SEEN   CRYSTALS NONE SEEN   CASTS NONE SEEN   Selected Results  AU CT-HEMATURIA PROTOCOL [id number] 12:00AM Jimmey Ralph   Test Name Result Flag Reference  AU CT-HEMATURIA PROTOCOL (Report)    ** RADIOLOGY REPORT BY [facility] **   CLINICAL DATA: Gross hematuria 10 days ago, lasting for 2 days. History of renal cell carcinoma with right partial nephrectomy in 2001.  EXAM: CT ABDOMEN AND PELVIS WITHOUT AND WITH CONTRAST  TECHNIQUE: Multidetector CT imaging of the abdomen and pelvis was performed following the standard protocol before and following the bolus administration of intravenous contrast.  CONTRAST: 125 cc Isovue  COMPARISON: Report from 07/26/2002  FINDINGS: No renal or ureteral calculus identified. No bladder calculus noted.  3.3 by 2.2 cm medial left mid kidney Bosniak  category 1 cyst.  Right mid kidney scarring may relate to prior operative intervention, with associated calices old diverticulum. No recurrent mass is identified. No abnormal renal enhancement on either side. The renal veins appear unremarkable and there is no pathologic retroperitoneal adenopathy.  Portal venous phase enhancement in the collecting systems and ureters appears  normal. There is mild abnormal wall thickening in the urinary bladder, some of which may be due to nondistention, with a suggestion of potential mild eccentricity of the left wall thickening and possibly slight irregularity along the inferior bladder wall.  Small bilateral inguinal hernias contain adipose tissue. The liver, spleen, pancreas, and adrenal glands appear unremarkable. No specific gallbladder or biliary abnormality identified. Appendix normal. Aortoiliac atherosclerotic vascular disease.  No pathologic pelvic adenopathy is observed.  IMPRESSION: 1. Mild bladder wall thickening and irregularity may partially be due to nondistention, but cystitis is not excluded. There is some minimal asymmetry in wall thickening to the left. If hematuria does not clear, cystoscopy may be warranted. A gross mass is not well seen. 2. No separate cause for hematuria identified. 3. Postoperative findings in the right mid kidney. 4. Small bilateral inguinal hernias contain adipose tissue.   Electronically Signed  By: Sherryl Barters M.D.  On: 04/18/2014 15:45   PSA [id number] 08:46AM Jimmey Ralph  SPECIMEN TYPE: BLOOD   Test Name Result Flag Reference  PSA 0.97 ng/mL  <=4.00  TEST METHODOLOGY: ECLIA PSA (ELECTROCHEMILUMINESCENCE IMMUNOASSAY)   BUN & CREATININE [id number] 08:46AM Jimmey Ralph  SPECIMEN TYPE: BLOOD   Test Name Result Flag Reference  CREATININE 0.91 mg/dL  0.50-1.50  BUN 14 mg/dL  6-23  Est GFR, African American >89 mL/min    Est GFR, NonAfrican American >89 mL/min    THE ESTIMATED GFR IS A CALCULATION VALID FOR ADULTS (>=33 YEARS OLD) THAT USES THE CKD-EPI ALGORITHM TO ADJUST FOR AGE AND SEX. IT IS   NOT TO BE USED FOR CHILDREN, PREGNANT WOMEN, HOSPITALIZED PATIENTS,    PATIENTS ON DIALYSIS, OR WITH RAPIDLY CHANGING KIDNEY FUNCTION. ACCORDING TO THE NKDEP, EGFR >89 IS NORMAL, 60-89 SHOWS MILD IMPAIRMENT, 30-59 SHOWS MODERATE IMPAIRMENT, 15-29 SHOWS  SEVERE IMPAIRMENT AND <15 IS ESRD.   Assessment Assessed  1. Bladder disorder (596.9) 2. Gross hematuria (599.71)  He has persistent hematuria and irritative voiding symptoms with asymmetric bladder thickening on CT.  He has no evidence of recurrent renal cell.  He has a left renal cyst.   Plan  Gross hematuria  1. Follow-up Schedule Surgery Office  Follow-up  Status: Complete  Done: [id number] Health Maintenance  2. UA With REFLEX; [Do Not Release]; Status:Resulted - Requires Verification;   Done:  [id number] 01:13PM  Urine cytology today.  He needs cystoscopy with possible biopsy but doesn't want to do it in the office so I will get it set up as an outpatient.   I have reviewed the risks of bleeding, infection, bladder injury, thrombotic events and anesthetic complications.  I will get this set up a couple of weeks after his back surgery that is scheduled for next week. s     URINE CYTOLOGY; Status:In Progress - Specimen/Data Collected;  Done: [id number]  Perform:[facility]; Due:08Sep2015; Marked Important; Last Updated ZG:YFVCBS, Santiago Glad; 05/05/2014 2:13:47 PM;Ordered; Today;   WHQ:PRFFM hematuria; Ordered BW:GYKZL, Akyla Vavrek;   Discussion/Summary CC: Dr. Sherley Bounds.

## 2014-05-16 NOTE — Progress Notes (Signed)
05/16/14 1114  OBSTRUCTIVE SLEEP APNEA  Have you ever been diagnosed with sleep apnea through a sleep study? No  Do you snore loudly (loud enough to be heard through closed doors)?  1  Do you often feel tired, fatigued, or sleepy during the daytime? 0  Has anyone observed you stop breathing during your sleep? 0  Do you have, or are you being treated for high blood pressure? 0  BMI more than 35 kg/m2? 0  Age over 63 years old? 1  Neck circumference greater than 40 cm/16 inches? 1  Gender: 1  Obstructive Sleep Apnea Score 4  Score 4 or greater  Results sent to PCP

## 2014-05-16 NOTE — Anesthesia Preprocedure Evaluation (Addendum)
Anesthesia Evaluation  Patient identified by MRN, date of birth, ID band Patient awake    Reviewed: Allergy & Precautions, H&P , NPO status , Patient's Chart, lab work & pertinent test results  Airway Mallampati: II TM Distance: >3 FB Neck ROM: Full    Dental no notable dental hx.    Pulmonary former smoker,  breath sounds clear to auscultation  Pulmonary exam normal       Cardiovascular Exercise Tolerance: Good negative cardio ROS  Rhythm:Regular Rate:Normal     Neuro/Psych negative neurological ROS  negative psych ROS   GI/Hepatic negative GI ROS, Neg liver ROS,   Endo/Other  negative endocrine ROS  Renal/GU negative Renal ROS  negative genitourinary   Musculoskeletal negative musculoskeletal ROS (+)   Abdominal   Peds negative pediatric ROS (+)  Hematology negative hematology ROS (+)   Anesthesia Other Findings   Reproductive/Obstetrics negative OB ROS                          Anesthesia Physical Anesthesia Plan  ASA: I  Anesthesia Plan: General   Post-op Pain Management:    Induction: Intravenous  Airway Management Planned: LMA  Additional Equipment:   Intra-op Plan:   Post-operative Plan: Extubation in OR  Informed Consent: I have reviewed the patients History and Physical, chart, labs and discussed the procedure including the risks, benefits and alternatives for the proposed anesthesia with the patient or authorized representative who has indicated his/her understanding and acceptance.   Dental advisory given  Plan Discussed with: CRNA  Anesthesia Plan Comments: (LMA 5 in 2013)       Anesthesia Quick Evaluation

## 2014-05-16 NOTE — Progress Notes (Signed)
NPO AFTER MN. ARRIVE AT 0700. NEEDS HG. MAY TAKE PERCOCET IF NEEDED AM DOS W/ SIPS OF WATER.

## 2014-05-17 ENCOUNTER — Encounter (HOSPITAL_BASED_OUTPATIENT_CLINIC_OR_DEPARTMENT_OTHER): Payer: BC Managed Care – PPO | Admitting: Anesthesiology

## 2014-05-17 ENCOUNTER — Encounter (HOSPITAL_BASED_OUTPATIENT_CLINIC_OR_DEPARTMENT_OTHER): Payer: Self-pay | Admitting: *Deleted

## 2014-05-17 ENCOUNTER — Encounter (HOSPITAL_BASED_OUTPATIENT_CLINIC_OR_DEPARTMENT_OTHER)
Admission: RE | Disposition: A | Discharge status: Home / Self Care | Payer: Self-pay | Source: Ambulatory Visit | Attending: Urology

## 2014-05-17 ENCOUNTER — Ambulatory Visit (HOSPITAL_BASED_OUTPATIENT_CLINIC_OR_DEPARTMENT_OTHER)
Admission: RE | Admit: 2014-05-17 | Discharge: 2014-05-17 | Disposition: A | Discharge status: Home / Self Care | Payer: BC Managed Care – PPO | Source: Ambulatory Visit | Attending: Urology | Admitting: Urology

## 2014-05-17 ENCOUNTER — Ambulatory Visit (HOSPITAL_BASED_OUTPATIENT_CLINIC_OR_DEPARTMENT_OTHER): Payer: BC Managed Care – PPO | Admitting: Anesthesiology

## 2014-05-17 DIAGNOSIS — M5137 Other intervertebral disc degeneration, lumbosacral region: Secondary | ICD-10-CM | POA: Insufficient documentation

## 2014-05-17 DIAGNOSIS — Z87891 Personal history of nicotine dependence: Secondary | ICD-10-CM | POA: Diagnosis not present

## 2014-05-17 DIAGNOSIS — C675 Malignant neoplasm of bladder neck: Secondary | ICD-10-CM | POA: Insufficient documentation

## 2014-05-17 DIAGNOSIS — N342 Other urethritis: Secondary | ICD-10-CM | POA: Insufficient documentation

## 2014-05-17 DIAGNOSIS — Z85528 Personal history of other malignant neoplasm of kidney: Secondary | ICD-10-CM | POA: Diagnosis not present

## 2014-05-17 DIAGNOSIS — R319 Hematuria, unspecified: Secondary | ICD-10-CM | POA: Diagnosis present

## 2014-05-17 DIAGNOSIS — N529 Male erectile dysfunction, unspecified: Secondary | ICD-10-CM | POA: Diagnosis not present

## 2014-05-17 DIAGNOSIS — M51379 Other intervertebral disc degeneration, lumbosacral region without mention of lumbar back pain or lower extremity pain: Secondary | ICD-10-CM | POA: Insufficient documentation

## 2014-05-17 DIAGNOSIS — C670 Malignant neoplasm of trigone of bladder: Secondary | ICD-10-CM | POA: Insufficient documentation

## 2014-05-17 DIAGNOSIS — N329 Bladder disorder, unspecified: Secondary | ICD-10-CM | POA: Diagnosis present

## 2014-05-17 DIAGNOSIS — Q6101 Congenital single renal cyst: Secondary | ICD-10-CM | POA: Insufficient documentation

## 2014-05-17 HISTORY — DX: Hematuria, unspecified: R31.9

## 2014-05-17 HISTORY — DX: Urgency of urination: R39.15

## 2014-05-17 HISTORY — PX: CYSTOSCOPY WITH BIOPSY: SHX5122

## 2014-05-17 HISTORY — DX: Personal history of other malignant neoplasm of kidney: Z85.528

## 2014-05-17 HISTORY — DX: Bladder disorder, unspecified: N32.9

## 2014-05-17 HISTORY — DX: Other specified health status: Z78.9

## 2014-05-17 HISTORY — DX: Other specified personal risk factors, not elsewhere classified: Z91.89

## 2014-05-17 LAB — POCT HEMOGLOBIN-HEMACUE: Hemoglobin: 15.3 g/dL (ref 13.0–17.0)

## 2014-05-17 SURGERY — CYSTOSCOPY, WITH BIOPSY
Anesthesia: General

## 2014-05-17 MED ORDER — MIDAZOLAM HCL 5 MG/5ML IJ SOLN
INTRAMUSCULAR | Status: DC | PRN
  Administered 2014-05-17 (×2): 1 mg via INTRAVENOUS

## 2014-05-17 MED ORDER — SODIUM CHLORIDE 0.9 % IV SOLN
250.0000 mL | INTRAVENOUS | Status: DC | PRN
Start: 2014-05-17 — End: 2014-05-17
  Filled 2014-05-17: qty 250

## 2014-05-17 MED ORDER — PROPOFOL 10 MG/ML IV BOLUS
INTRAVENOUS | Status: DC | PRN
  Administered 2014-05-17: 250 mg via INTRAVENOUS

## 2014-05-17 MED ORDER — CIPROFLOXACIN IN D5W 400 MG/200ML IV SOLN
INTRAVENOUS | Status: AC
  Filled 2014-05-17: qty 200

## 2014-05-17 MED ORDER — OXYCODONE-ACETAMINOPHEN 5-325 MG PO TABS: 1.0000 | ORAL_TABLET | ORAL | Status: DC | PRN

## 2014-05-17 MED ORDER — ONDANSETRON HCL 4 MG/2ML IJ SOLN
INTRAMUSCULAR | Status: DC | PRN
  Administered 2014-05-17: 4 mg via INTRAVENOUS

## 2014-05-17 MED ORDER — DEXAMETHASONE SODIUM PHOSPHATE 4 MG/ML IJ SOLN
INTRAMUSCULAR | Status: DC | PRN
  Administered 2014-05-17: 10 mg via INTRAVENOUS

## 2014-05-17 MED ORDER — FENTANYL CITRATE 0.05 MG/ML IJ SOLN
INTRAMUSCULAR | Status: DC | PRN
  Administered 2014-05-17: 25 ug via INTRAVENOUS
  Administered 2014-05-17: 50 ug via INTRAVENOUS
  Administered 2014-05-17 (×3): 25 ug via INTRAVENOUS
  Administered 2014-05-17: 50 ug via INTRAVENOUS

## 2014-05-17 MED ORDER — MIDAZOLAM HCL 2 MG/2ML IJ SOLN
INTRAMUSCULAR | Status: AC
  Filled 2014-05-17: qty 2

## 2014-05-17 MED ORDER — ACETAMINOPHEN 325 MG PO TABS
650.0000 mg | ORAL_TABLET | ORAL | Status: DC | PRN
  Filled 2014-05-17: qty 2

## 2014-05-17 MED ORDER — OXYCODONE HCL 5 MG PO TABS
5.0000 mg | ORAL_TABLET | ORAL | Status: DC | PRN
  Administered 2014-05-17: 5 mg via ORAL
  Filled 2014-05-17: qty 2

## 2014-05-17 MED ORDER — ACETAMINOPHEN 10 MG/ML IV SOLN
INTRAVENOUS | Status: DC | PRN
  Administered 2014-05-17: 1000 mg via INTRAVENOUS

## 2014-05-17 MED ORDER — SODIUM CHLORIDE 0.9 % IJ SOLN
3.0000 mL | Freq: Two times a day (BID) | INTRAMUSCULAR | Status: DC
  Filled 2014-05-17: qty 3

## 2014-05-17 MED ORDER — PHENAZOPYRIDINE HCL 200 MG PO TABS: 200.0000 mg | ORAL_TABLET | Freq: Three times a day (TID) | ORAL | Status: DC | PRN

## 2014-05-17 MED ORDER — ACETAMINOPHEN 650 MG RE SUPP
650.0000 mg | RECTAL | Status: DC | PRN
  Filled 2014-05-17: qty 1

## 2014-05-17 MED ORDER — FENTANYL CITRATE 0.05 MG/ML IJ SOLN
25.0000 ug | INTRAMUSCULAR | Status: DC | PRN
  Filled 2014-05-17: qty 1

## 2014-05-17 MED ORDER — FENTANYL CITRATE 0.05 MG/ML IJ SOLN
INTRAMUSCULAR | Status: AC
  Filled 2014-05-17: qty 6

## 2014-05-17 MED ORDER — CIPROFLOXACIN IN D5W 400 MG/200ML IV SOLN
400.0000 mg | INTRAVENOUS | Status: AC
  Administered 2014-05-17: 400 mg via INTRAVENOUS
  Filled 2014-05-17: qty 200

## 2014-05-17 MED ORDER — PROMETHAZINE HCL 25 MG/ML IJ SOLN
6.2500 mg | INTRAMUSCULAR | Status: DC | PRN
  Filled 2014-05-17: qty 1

## 2014-05-17 MED ORDER — LACTATED RINGERS IV SOLN
INTRAVENOUS | Status: DC
  Administered 2014-05-17: 08:00:00 via INTRAVENOUS
  Filled 2014-05-17: qty 1000

## 2014-05-17 MED ORDER — FENTANYL CITRATE 0.05 MG/ML IJ SOLN
INTRAMUSCULAR | Status: AC
  Filled 2014-05-17: qty 2

## 2014-05-17 MED ORDER — BELLADONNA ALKALOIDS-OPIUM 16.2-60 MG RE SUPP
RECTAL | Status: DC | PRN
  Administered 2014-05-17: 1 via RECTAL

## 2014-05-17 MED ORDER — SODIUM CHLORIDE 0.9 % IJ SOLN
3.0000 mL | INTRAMUSCULAR | Status: DC | PRN
  Filled 2014-05-17: qty 3

## 2014-05-17 MED ORDER — FENTANYL CITRATE 0.05 MG/ML IJ SOLN
25.0000 ug | INTRAMUSCULAR | Status: DC | PRN
  Administered 2014-05-17 (×2): 25 ug via INTRAVENOUS
  Filled 2014-05-17: qty 1

## 2014-05-17 MED ORDER — OXYCODONE HCL 5 MG PO TABS
ORAL_TABLET | ORAL | Status: AC
  Filled 2014-05-17: qty 1

## 2014-05-17 MED ORDER — BELLADONNA ALKALOIDS-OPIUM 16.2-60 MG RE SUPP
RECTAL | Status: AC
  Filled 2014-05-17: qty 1

## 2014-05-17 MED ORDER — LIDOCAINE HCL (CARDIAC) 20 MG/ML IV SOLN
INTRAVENOUS | Status: DC | PRN
  Administered 2014-05-17: 60 mg via INTRAVENOUS

## 2014-05-17 MED ORDER — LACTATED RINGERS IV SOLN
INTRAVENOUS | Status: DC | PRN
  Administered 2014-05-17: 08:00:00 via INTRAVENOUS

## 2014-05-17 MED ORDER — STERILE WATER FOR IRRIGATION IR SOLN
Status: DC | PRN
  Administered 2014-05-17: 6000 mL

## 2014-05-17 SURGICAL SUPPLY — 22 items
BAG DRAIN URO-CYSTO SKYTR STRL (DRAIN) ×2 IMPLANT
CANISTER SUCT LVC 12 LTR MEDI- (MISCELLANEOUS) ×2 IMPLANT
CATH FOLEY 2WAY SLVR  5CC 16FR (CATHETERS)
CATH FOLEY 2WAY SLVR 5CC 16FR (CATHETERS) IMPLANT
CLOTH BEACON ORANGE TIMEOUT ST (SAFETY) ×2 IMPLANT
DRAPE CAMERA CLOSED 9X96 (DRAPES) ×2 IMPLANT
ELECT REM PT RETURN 9FT ADLT (ELECTROSURGICAL) ×2
ELECTRODE REM PT RTRN 9FT ADLT (ELECTROSURGICAL) ×1 IMPLANT
GLOVE BIO SURGEON STRL SZ 6.5 (GLOVE) ×2 IMPLANT
GLOVE BIOGEL PI IND STRL 6.5 (GLOVE) ×1 IMPLANT
GLOVE BIOGEL PI INDICATOR 6.5 (GLOVE) ×1
GLOVE SURG SS PI 8.0 STRL IVOR (GLOVE) ×2 IMPLANT
GOWN PREVENTION PLUS LG XLONG (DISPOSABLE) ×2 IMPLANT
GOWN STRL REIN XL XLG (GOWN DISPOSABLE) ×2 IMPLANT
GOWN STRL REUS W/TWL LRG LVL3 (GOWN DISPOSABLE) ×2 IMPLANT
NDL SAFETY ECLIPSE 18X1.5 (NEEDLE) IMPLANT
NEEDLE HYPO 18GX1.5 SHARP (NEEDLE)
NEEDLE HYPO 22GX1.5 SAFETY (NEEDLE) IMPLANT
NS IRRIG 500ML POUR BTL (IV SOLUTION) IMPLANT
PACK CYSTOSCOPY (CUSTOM PROCEDURE TRAY) ×2 IMPLANT
SYR 20CC LL (SYRINGE) IMPLANT
WATER STERILE IRR 3000ML UROMA (IV SOLUTION) ×2 IMPLANT

## 2014-05-17 NOTE — Anesthesia Postprocedure Evaluation (Signed)
  Anesthesia Post-op Note  Patient: Danny Wood  Procedure(s) Performed: Procedure(s) (LRB): CYSTO WITH BLADDER BIOPSY (N/A)  Patient Location: PACU  Anesthesia Type: General  Level of Consciousness: awake and alert   Airway and Oxygen Therapy: Patient Spontanous Breathing  Post-op Pain: mild  Post-op Assessment: Post-op Vital signs reviewed, Patient's Cardiovascular Status Stable, Respiratory Function Stable, Patent Airway and No signs of Nausea or vomiting  Last Vitals:  Filed Vitals:   05/17/14 0945  BP: 171/83  Pulse: 78  Temp:   Resp: 16    Post-op Vital Signs: stable   Complications: No apparent anesthesia complications

## 2014-05-17 NOTE — Brief Op Note (Signed)
05/17/2014  9:18 AM  PATIENT:  Danny Wood  63 y.o. male  PRE-OPERATIVE DIAGNOSIS:  HEMATURIA/BLADDER DISORDER  POST-OPERATIVE DIAGNOSIS:  hematuria carcinoma insitu  PROCEDURE:  Procedure(s): CYSTO WITH BLADDER BIOPSY (N/A)  PROSTATIC URETHRAL AND URETHRAL BIOPSIES.  SURGEON:  Surgeon(s) and Role:    * Malka So, MD - Primary  PHYSICIAN ASSISTANT:   ASSISTANTS: none   ANESTHESIA:   general  EBL:  Total I/O In: 200 [I.V.:200] Out: -   BLOOD ADMINISTERED:none  DRAINS: Urinary Catheter (Foley)   LOCAL MEDICATIONS USED:  NONE  SPECIMEN:  Source of Specimen:  bladder biopsies from the trigone, left bladder neck, left bladder wall, dome, prostatic urethra and urethra.   DISPOSITION OF SPECIMEN:  PATHOLOGY  COUNTS:  YES  TOURNIQUET:  * No tourniquets in log *  DICTATION: .Other Dictation: Dictation Number [id number]  PLAN OF CARE: Discharge to home after PACU  PATIENT DISPOSITION:  PACU - hemodynamically stable.   Delay start of Pharmacological VTE agent (>24hrs) due to surgical blood loss or risk of bleeding: not applicable

## 2014-05-17 NOTE — Transfer of Care (Signed)
Immediate Anesthesia Transfer of Care Note  Patient: Danny Wood  Procedure(s) Performed: Procedure(s) (LRB): CYSTO WITH BLADDER BIOPSY (N/A)  Patient Location: PACU  Anesthesia Type: General  Level of Consciousness: awake, sedated, patient cooperative and responds to stimulation  Airway & Oxygen Therapy: Patient Spontanous Breathing and Patient connected to face mask oxygen  Post-op Assessment: Report given to PACU RN, Post -op Vital signs reviewed and stable and Patient moving all extremities  Post vital signs: Reviewed and stable  Complications: No apparent anesthesia complications

## 2014-05-17 NOTE — Discharge Instructions (Addendum)
Foley Catheter Care A Foley catheter is a soft, flexible tube. This tube is placed into your bladder to drain pee (urine). If you go home with this catheter in place, follow the instructions below. TAKING CARE OF THE CATHETER 1. Wash your hands with soap and water. 2. Put soap and water on a clean washcloth.  Clean the skin where the tube goes into your body.  Clean away from the tube site.  Never wipe toward the tube.  Clean the area using a circular motion.  Remove all the soap. Pat the area dry with a clean towel. For males, reposition the skin that covers the end of the penis (foreskin). 3. Attach the tube to your leg with tape or a leg strap. Do not stretch the tube tight. If you are using tape, remove any stickiness left behind by past tape you used. 4. Keep the drainage bag below your hips. Keep it off the floor. 5. Check your tube during the day. Make sure it is working and draining. Make sure the tube does not curl, twist, or bend. 6. Do not pull on the tube or try to take it out. TAKING CARE OF THE DRAINAGE BAGS You will have a large overnight drainage bag and a small leg bag. You may wear the overnight bag any time. Never wear the small bag at night. Follow the directions below. Emptying the Drainage Bag Empty your drainage bag when it is  - full or at least 2-3 times a day. 1. Wash your hands with soap and water. 2. Keep the drainage bag below your hips. 3. Hold the dirty bag over the toilet or clean container. 4. Open the pour spout at the bottom of the bag. Empty the pee into the toilet or container. Do not let the pour spout touch anything. 5. Clean the pour spout with a gauze pad or cotton ball that has rubbing alcohol on it. 6. Close the pour spout. 7. Attach the bag to your leg with tape or a leg strap. 8. Wash your hands well. Changing the Drainage Bag Change your bag once a month or sooner if it starts to smell or look dirty.  1. Wash your hands with soap and  water. 2. Pinch the rubber tube so that pee does not spill out. 3. Disconnect the catheter tube from the drainage tube at the connection valve. Do not let the tubes touch anything. 4. Clean the end of the catheter tube with an alcohol wipe. Clean the end of a the drainage tube with a different alcohol wipe. 5. Connect the catheter tube to the drainage tube of the clean drainage bag. 6. Attach the new bag to the leg with tape or a leg strap. Avoid attaching the new bag too tightly. 7. Wash your hands well. Cleaning the Drainage Bag 1. Wash your hands with soap and water. 2. Wash the bag in warm, soapy water. 3. Rinse the bag with warm water. 4. Fill the bag with a mixture of white vinegar and water (1 cup vinegar to 1 quart warm water [.2 liter vinegar to 1 liter warm water]). Close the bag and soak it for 30 minutes in the solution. 5. Rinse the bag with warm water. 6. Hang the bag to dry with the pour spout open and hanging downward. 7. Store the clean bag (once it is dry) in a clean plastic bag. 8. Wash your hands well. PREVENT INFECTION  Wash your hands before and after touching your tube.  Take showers every day. Wash the skin where the tube enters your body. Do not take baths. Replace wet leg straps with dry ones, if this applies.  Do not use powders, sprays, or lotions on the genital area. Only use creams, lotions, or ointments as told by your doctor.  For females, wipe from front to back after going to the bathroom.  Drink enough fluids to keep your pee clear or pale yellow unless you are told not to have too much fluid (fluid restriction).  Do not let the drainage bag or tubing touch or lie on the floor.  Wear cotton underwear to keep the area dry. GET HELP IF:  Your pee is cloudy or smells unusually bad.  Your tube becomes clogged.  You are not draining pee into the bag or your bladder feels full.  Your tube starts to leak. GET HELP RIGHT AWAY IF:  You have pain,  puffiness (swelling), redness, or yellowish-white fluid (pus) where the tube enters the body.  You have pain in the belly (abdomen), legs, lower back, or bladder.  You have a fever.  You see blood fill the tube, or your pee is pink or red.  You feel sick to your stomach (nauseous), throw up (vomit), or have chills.  Your tube gets pulled out. MAKE SURE YOU:   Understand these instructions.  Will watch your condition.  Will get help right away if you are not doing well or get worse. Document Released: 12/14/2012 Document Revised: 01/03/2014 Document Reviewed: 12/14/2012 [facility] Patient Information 2015 [place], [place]. This information is not intended to replace advice given to you by your health care provider. Make sure you discuss any questions you have with your health care provider. Cystoscopy, Care After Refer to this sheet in the next few weeks. These instructions provide you with information on caring for yourself after your procedure. Your caregiver may also give you more specific instructions. Your treatment has been planned according to current medical practices, but problems sometimes occur. Call your caregiver if you have any problems or questions after your procedure. HOME CARE INSTRUCTIONS  Things you can do to ease any discomfort after your procedure include:  Drinking enough water and fluids to keep your urine clear or pale yellow.  Taking a warm bath to relieve any burning feelings. SEEK IMMEDIATE MEDICAL CARE IF:   You have an increase in blood in your urine.  You notice blood clots in your urine.  You have difficulty passing urine.  You have the chills.  You have abdominal pain.  You have a fever or persistent symptoms for more than 2-3 days.  You have a fever and your symptoms suddenly get worse. MAKE SURE YOU:   Understand these instructions.  Will watch your condition.  Will get help right away if you are not doing well or get worse. Document  Released: 03/08/2005 Document Revised: 04/21/2013 Document Reviewed: 02/10/2012 [facility] Patient Information 2015 [place], [place]. This information is not intended to replace advice given to you by your health care provider. Make sure you discuss any questions you have with your health care provider.   You may remove the catheter at home in the morning but if you don't feel comfortable with that, please call the office to come in to have it done.   Post Anesthesia Home Care Instructions  Activity: Get plenty of rest for the remainder of the day. A responsible adult should stay with you for 24 hours following the procedure.  For the next  24 hours, DO NOT: -Drive a car -Paediatric nurse -Drink alcoholic beverages -Take any medication unless instructed by your physician -Make any legal decisions or sign important papers.  Meals: Start with liquid foods such as gelatin or soup. Progress to regular foods as tolerated. Avoid greasy, spicy, heavy foods. If nausea and/or vomiting occur, drink only clear liquids until the nausea and/or vomiting subsides. Call your physician if vomiting continues.  Special Instructions/Symptoms: Your throat may feel dry or sore from the anesthesia or the breathing tube placed in your throat during surgery. If this causes discomfort, gargle with warm salt water. The discomfort should disappear within 24 hours.

## 2014-05-17 NOTE — Interval H&P Note (Signed)
History and Physical Interval Note:  05/17/2014 7:24 AM  Danny Wood  has presented today for surgery, with the diagnosis of HEMATURIA/BLADDER DISORDER  The various methods of treatment have been discussed with the patient and family. After consideration of risks, benefits and other options for treatment, the patient has consented to  Procedure(s): CYSTO WITH POSSIBLE BLADDER BIOPSY (N/A) as a surgical intervention .  The patient's history has been reviewed, patient examined, no change in status, stable for surgery.  I have reviewed the patient's chart and labs.  Questions were answered to the patient's satisfaction.     Ravinder Hofland J

## 2014-05-17 NOTE — Anesthesia Procedure Notes (Signed)
Procedure Name: Intubation Date/Time: 05/17/2014 8:48 AM Performed by: Justice Rocher Pre-anesthesia Checklist: Patient identified, Emergency Drugs available, Suction available and Patient being monitored Patient Re-evaluated:Patient Re-evaluated prior to inductionOxygen Delivery Method: Circle System Utilized Preoxygenation: Pre-oxygenation with 100% oxygen Intubation Type: IV induction Ventilation: Mask ventilation without difficulty LMA: LMA inserted LMA Size: 5.0 Number of attempts: 1 Placement Confirmation: positive ETCO2 and breath sounds checked- equal and bilateral Tube secured with: Tape Dental Injury: Teeth and Oropharynx as per pre-operative assessment

## 2014-05-17 NOTE — Progress Notes (Signed)
Had to wait for wife to arrive to go over instruction.  Dr.Wrenn in to speak with wife and patient reguarding surgery.

## 2014-05-18 ENCOUNTER — Encounter (HOSPITAL_BASED_OUTPATIENT_CLINIC_OR_DEPARTMENT_OTHER): Payer: Self-pay | Admitting: Urology

## 2014-05-18 NOTE — Op Note (Signed)
NAME:  NORM, WRAY NO.:  [id number]  MEDICAL RECORD NO.:  [id number]  LOCATION:                                 FACILITY:  PHYSICIAN:  Marshall Cork. Jeffie Pollock, M.D.    DATE OF BIRTH:  Mar 17, 1951  DATE OF PROCEDURE:  05/17/2014 DATE OF DISCHARGE:  05/17/2014                              OPERATIVE REPORT   PROCEDURES:  Cystoscopy with biopsy and fulguration of 6 sites.  PREOPERATIVE DIAGNOSIS:  Hematuria with irritative voiding symptoms and asymmetric bladder wall thickening.  POSTOPERATIVE DIAGNOSIS:  Hematuria with irritative voiding symptoms and asymmetric bladder wall thickening with probable carcinoma in situ with the extensive bladder involvement, possible prostatic urethral and urethral involvement.  SURGEON:  Marshall Cork. Jeffie Pollock, M.D.  ANESTHESIA:  General.  SPECIMENS:  Cup biopsies from the trigone, left bladder neck, left lateral wall, and dome along with the prostatic urethra and the bulbar and pendulous urethra.  BLOOD LOSS:  Minimal.  DRAINS:  A 20-French Foley catheter.  COMPLICATIONS:  None.  INDICATIONS:  Danny Wood is a 63 year old white male who recently presented with passage of gross hematuria and irritative voiding symptoms.  A CT hematuria study demonstrated asymmetric bladder wall thickening.  He declined office cystoscopy, but it was felt that cystoscopy in the OR with probable biopsy was indicated.  FINDINGS OF PROCEDURE:  He was given Cipro.  He was taken to the operating room where general anesthetic was induced.  He was placed in the lithotomy position and was fitted with PAS hose.  His perineum and genitalia were prepped with Betadine solution,and he was draped in usual sterile fashion.  Cystoscopy was performed using the 22-French scope and the 12 and 70 degree lenses.  On initial passage, the urethra was unremarkable.  The prostatic urethra was approximately 3 cm in length with bilobar hyperplasia and slight elevation of  bladder neck with some obstruction. There were some erythematous, somewhat cobblestoning changes of the prostatic urethra.  They were worrisome for possible carcinoma in situ. Examination of the bladder demonstrated ureteral orifices in the normal anatomic position.  He had moderate trabeculation with several cellules and small diverticular particularly on the posterior wall.  There was 1 papillary tumor approximately 5 mm in size and a small diverticulum on the posterior wall.  There were extensive areas erythematous heaped up mucosa that scar consistent with carcinoma in situ.  Once the initial cystoscopy had been performed, cup biopsies were obtained from the suspicious areas in the trigone, the left bladder neck, the left lateral wall, and dome as well as the prostatic urethra proximally.  I then used the Bugbee electrode to fulgurate all the biopsy sites as well as the small tumor that was in the diverticulum.  I did not biopsy the tumor because it was in a diverticulum and I was concerned about the thinness of the underlying bladder wall.  Once all the biopsy sites had been fulgurated and hemostasis was achieved, I backed the scope into the urethra where the mucosa had a little bit more of a shaggy appearance than I realized on my initial passage.  Because of this and concern for extensive carcinoma in situ, I obtained 2 cup biopsies 1 from the  right bulbar urethra very superficially and then 1 from the left pendulous urethra also very superficially.  These biopsies were sent together.  These biopsy sites were then gently fulgurated. Reinspection of the bladder revealed a little oozing at the bladder neck from the prostatic biopsies, so this was Re fulgurated and it was felt that a Foley catheter was indicated.  I did not attempt to fulgurate all of the abnormal mucosa in the bladder because it probably involved half to 2/3 of the bladder mucosa and I did not feel that would be  productive.  A 20-French Foley catheter was inserted.  The balloon was filled with 10 mL of sterile fluid.  The catheter was irrigated with very light pink return and placed to straight drainage.  A B and O suppository was placed.  The patient was taken Wood from lithotomy position.  His anesthetic was reversed.  He was moved to recovery room in stable condition.  There were no complications.     Marshall Cork. Jeffie Pollock, M.D.     JJW/MEDQ  D:  05/17/2014  T:  05/17/2014  Job:  [id number]

## 2014-05-18 NOTE — Op Note (Signed)
NAME:  MARQUICE, UDDIN NO.:  [id number]  MEDICAL RECORD NO.:  [id number]  LOCATION:                                 FACILITY:  PHYSICIAN:  Marshall Cork. Jeffie Pollock, M.D.    DATE OF BIRTH:  [birth year]  DATE OF PROCEDURE:  05/17/2014 DATE OF DISCHARGE:  05/17/2014                              OPERATIVE REPORT   PROCEDURE:  Cystoscopy with biopsy and fulguration from the bladder, prostate, prostatic urethra and urethra.  PREOPERATIVE DIAGNOSIS:  History of hematuria with irritative voiding symptoms and asymmetric bladder wall thickening on CT.  POSTOPERATIVE DIAGNOSIS:  Extensive mucosal changes consistent with carcinoma in situ with worrisome lesions on the bladder wall extensively, the prostatic urethra  Dictation ended at this point.     Marshall Cork. Jeffie Pollock, M.D.     JJW/MEDQ  D:  05/17/2014  T:  05/17/2014  Job:  [id number]

## 2014-05-24 ENCOUNTER — Telehealth: Payer: Self-pay | Admitting: Oncology

## 2014-05-24 NOTE — Telephone Encounter (Signed)
Left pt vm to return call in ref to np appt.

## 2014-05-25 ENCOUNTER — Telehealth: Payer: Self-pay | Admitting: Oncology

## 2014-05-25 NOTE — Telephone Encounter (Signed)
C/D 05/25/14 for appt. 06/01/14

## 2014-05-25 NOTE — Telephone Encounter (Signed)
S/W PT IN REF TO NP APPT. 06/01/14@10 :30 REFERRING-DR [facility] DX-NEW STAGE 2 BLADDER CANCER

## 2014-06-01 ENCOUNTER — Telehealth: Payer: Self-pay | Admitting: Oncology

## 2014-06-01 ENCOUNTER — Encounter: Payer: Self-pay | Admitting: Oncology

## 2014-06-01 ENCOUNTER — Encounter: Payer: Self-pay | Admitting: *Deleted

## 2014-06-01 ENCOUNTER — Telehealth: Payer: Self-pay | Admitting: *Deleted

## 2014-06-01 ENCOUNTER — Ambulatory Visit: Payer: BC Managed Care – PPO

## 2014-06-01 ENCOUNTER — Ambulatory Visit (HOSPITAL_BASED_OUTPATIENT_CLINIC_OR_DEPARTMENT_OTHER): Payer: BC Managed Care – PPO | Admitting: Oncology

## 2014-06-01 ENCOUNTER — Other Ambulatory Visit: Payer: BC Managed Care – PPO

## 2014-06-01 VITALS — BP 152/75 | HR 67 | Temp 98.4°F | Resp 18 | Ht 72.0 in | Wt 214.0 lb

## 2014-06-01 DIAGNOSIS — R319 Hematuria, unspecified: Secondary | ICD-10-CM

## 2014-06-01 DIAGNOSIS — C670 Malignant neoplasm of trigone of bladder: Secondary | ICD-10-CM | POA: Insufficient documentation

## 2014-06-01 DIAGNOSIS — C679 Malignant neoplasm of bladder, unspecified: Secondary | ICD-10-CM

## 2014-06-01 MED ORDER — ONDANSETRON HCL 8 MG PO TABS
8.0000 mg | ORAL_TABLET | Freq: Three times a day (TID) | ORAL | Status: DC | PRN
Start: 2014-06-01 — End: 2019-10-12

## 2014-06-01 MED ORDER — LIDOCAINE-PRILOCAINE 2.5-2.5 % EX CREA: 1.0000 "application " | TOPICAL_CREAM | CUTANEOUS | Status: DC | PRN

## 2014-06-01 NOTE — Progress Notes (Signed)
Checked in new patient with no financial issues prior to seeing the dr. He has primary/secondary insurance. He has not been out of the country.

## 2014-06-01 NOTE — Telephone Encounter (Signed)
Per staff phone call and POF I have schedueld appts. Scheduler advised of appts.  JMW  

## 2014-06-01 NOTE — Consult Note (Signed)
Reason for Referral: Bladder cancer.   HPI: Danny Wood is a 63 year old gentleman native of [place] but currently lives in this area for the last 18 years. He has a history of a renal cell carcinoma status post nephrectomy in 2003. He also has a bulging disc and status post surgical repair on 05/11/2014. He was in his usual state of health about 6 weeks ago when he started developing painful hematuria. He was evaluated by Dr. Jeffie Wood and underwent a CT scan on 04/18/2014 showed a mild bladder wall thickening and irregularity with minimal asymmetry in wall thickening to the left. No evidence of any disease outside the bladder. He underwent a cystoscopy on 05/17/2014 which showed extensive bladder wall thickening with carcinoma in situ and possible involvement of the prostatic urethra. Biopsy of the bladder trigone showed a high-grade invasive urothelial carcinoma involving superficial muscle layer extending into the deep edge. The bladder neck showed carcinoma in situ without any definitive stromal invasion. The rest of the biopsy showed carcinoma in situ were benign tissue. Patient is currently evaluated for possible cystectomy and is asked to comment about the role of neoadjuvant chemotherapy.  Clinically, he is to have urinary symptoms including dysuria and hematuria. He does not report any abdominal pain or flank pain. He does not report any constitutional symptoms of fevers, chills, weight loss or decline in his energy. He does not report any headaches, blurred vision, syncope or seizures. His report any chest pain, shortness of breath, palpitation, orthopnea, PND. He does not report any cough or wheezing. He has not reported any nausea, vomiting, constipation, diarrhea or change in his bowel habits. He does not report any arthralgias or myalgias. He is now referred any lymphadenopathy or petechiae. He does report chronic back pain and paresthesias in his left leg after his recent operation. He  continues to have excellent performance status and continued to be active without any decline. Rest of his review of systems unremarkable   Past Medical History  Diagnosis Date  . History of renal cell carcinoma     2003  S/P  PARTIAL RIGHT NEPHRECTOMY  . Hematuria   . Bladder disorder   . Urgency of urination   . Presence of surgical incision     lumbar diskectomy 05-11-2014-  bandage present  . At risk for sleep apnea     STOP-BANG= 4       SENT TO PCP 05-16-2014  :  Past Surgical History  Procedure Laterality Date  . Evaluation under anesthesia with hemorrhoidectomy  07/07/2012    Procedure: EXAM UNDER ANESTHESIA WITH HEMORRHOIDECTOMY;  Surgeon: Danny Faster. Cornett, MD;  Location: [place];  Service: General;  Laterality: N/A;  . Partial nephrectomy Right 2003  . Lumbar microdiscectomy  05-11-2014    L4 -- L5 (partial)  . Cystoscopy with biopsy N/A 05/17/2014    Procedure: CYSTO WITH BLADDER BIOPSY;  Surgeon: Danny So, MD;  Location: [facility];  Service: Urology;  Laterality: N/A;  :   Current Outpatient Prescriptions  Medication Sig Dispense Refill  . oxyCODONE-acetaminophen (PERCOCET) 5-325 MG per tablet Take 1 tablet by mouth every 4 (four) hours as needed for severe pain.  30 tablet  0  . phenazopyridine (PYRIDIUM) 200 MG tablet Take 1 tablet (200 mg total) by mouth 3 (three) times daily as needed for pain.  10 tablet  0  . lidocaine-prilocaine (EMLA) cream Apply 1 application topically as needed. Apply to port before chemotherapy.  30 g  0  .  ondansetron (ZOFRAN) 8 MG tablet Take 1 tablet (8 mg total) by mouth every 8 (eight) hours as needed for nausea or vomiting.  20 tablet  0   No current facility-administered medications for this visit.      No Known Allergies:  Family History: His father had lymphoma and his mother had lung cancer  History   Social History  . Marital Status: Married    Spouse Name: N/A    Number of Children: N/A  . Years of  Education: N/A   Occupational History  . Not on file.   Social History Main Topics  . Smoking status: Former Smoker -- 0.50 packs/day for 25 years    Types: Cigarettes    Quit date: 06/02/2004  . Smokeless tobacco: Never Used  . Alcohol Use: 1.0 oz/week    2 drink(s) per week  . Drug Use: No  . Sexual Activity: Not on file   Other Topics Concern  . Not on file   Social History Narrative  . No narrative on file  :  Pertinent items are noted in HPI.  Exam: ECOG 0 Blood pressure 152/75, pulse 67, temperature 98.4 F (36.9 C), temperature source Oral, resp. rate 18, height 6' (1.829 m), weight 214 lb (97.07 kg), SpO2 100.00%. General appearance: alert and cooperative Head: Normocephalic, without obvious abnormality Throat: lips, mucosa, and tongue normal; teeth and gums normal Neck: no adenopathy Back: negative Resp: clear to auscultation bilaterally Chest wall: no tenderness Cardio: regular rate and rhythm, S1, S2 normal, no murmur, click, rub or gallop GI: soft, non-tender; bowel sounds normal; no masses,  no organomegaly Extremities: extremities normal, atraumatic, no cyanosis or edema Pulses: 2+ and symmetric Skin: Skin color, texture, turgor normal. No rashes or lesions Lymph nodes: Cervical, supraclavicular, and axillary nodes normal.    Assessment and Plan:    63 year old man with the following issues:  1. High-grade, muscle invasive transitional cell carcinoma of the bladder diagnosed in September 2015 after presenting with symptomatic hematuria. His clinical stage is T2 N0 with disease into the superficial muscle of the bladder wall. The natural course of this disease was discussed today in detail as well as treatment options. I concur with Dr. Jeffie Wood that a radical cystectomy is the treatment of choice and we'll offer her the best curative option.  Review of systemic chemotherapy was discussed extensively with the patient. I explained to them that the standard  of care at this point is the utilization of neoadjuvant chemotherapy with a cisplatin-based regimen. The rationale as well as the risks and benefits of this approach were discussed extensively. The logistics of administration of cisplatin chemotherapy was discussed. Complications include nausea, vomiting, myelosuppression, neutropenia, neutropenic sepsis, and renal toxicity, electrolyte imbalance as well as infusion related complications were discussed. I also discussed with them the combination with Gemzar chemotherapy and the complications associated with that. I also discussed with him the possible need for growth factors put and rarely sepsis and the need for hospitalization. Benefit remains incremental with somewhere between 5-7% improvement in his outcome.  After discussing the risks and benefits he is willing to proceed after a chemotherapy education class. I plan to start on 06/17/2014 with Gemzar and cisplatin on day 1 and Gemzar alone on day 8 as a part of a 21 day cycle. A total of 3 cycles will be given in the neoadjuvant setting with anticipating treatment and on around the first week of December.  2. IV access: The risks and benefits of a  Port-A-Cath insertion was discussed. Complications include bleeding, thrombosis, infection were discussed and he is ready to proceed and Emla cream Rx was given to him today.  3. Antiemetics: Prescription for Zofran was given to the patient with instructions how to use it.  All his questions were answered today to his satisfaction.

## 2014-06-01 NOTE — Progress Notes (Signed)
Please see consult note.  

## 2014-06-02 ENCOUNTER — Encounter: Payer: Self-pay | Admitting: Medical Oncology

## 2014-06-02 ENCOUNTER — Telehealth: Payer: Self-pay | Admitting: *Deleted

## 2014-06-02 NOTE — Telephone Encounter (Signed)
Per Dr. Alen Blew ok to cancel the port placement.  Rubin Payor, nurse will cancel.

## 2014-06-16 ENCOUNTER — Other Ambulatory Visit (HOSPITAL_COMMUNITY): Payer: BC Managed Care – PPO

## 2014-06-16 ENCOUNTER — Ambulatory Visit (HOSPITAL_COMMUNITY): Payer: BC Managed Care – PPO

## 2014-06-17 ENCOUNTER — Other Ambulatory Visit (HOSPITAL_BASED_OUTPATIENT_CLINIC_OR_DEPARTMENT_OTHER): Payer: BC Managed Care – PPO

## 2014-06-17 ENCOUNTER — Telehealth: Payer: Self-pay | Admitting: Oncology

## 2014-06-17 ENCOUNTER — Other Ambulatory Visit: Payer: Self-pay | Admitting: Oncology

## 2014-06-17 ENCOUNTER — Ambulatory Visit (HOSPITAL_BASED_OUTPATIENT_CLINIC_OR_DEPARTMENT_OTHER): Payer: BC Managed Care – PPO | Admitting: Oncology

## 2014-06-17 ENCOUNTER — Ambulatory Visit (HOSPITAL_BASED_OUTPATIENT_CLINIC_OR_DEPARTMENT_OTHER): Payer: BC Managed Care – PPO

## 2014-06-17 VITALS — BP 140/64 | HR 61 | Temp 97.9°F | Resp 18 | Ht 72.0 in | Wt 215.1 lb

## 2014-06-17 DIAGNOSIS — C679 Malignant neoplasm of bladder, unspecified: Secondary | ICD-10-CM

## 2014-06-17 DIAGNOSIS — Z5111 Encounter for antineoplastic chemotherapy: Secondary | ICD-10-CM

## 2014-06-17 LAB — CBC WITH DIFFERENTIAL/PLATELET
BASO%: 0.9 % (ref 0.0–2.0)
Basophils Absolute: 0.1 10*3/uL (ref 0.0–0.1)
EOS%: 3.7 % (ref 0.0–7.0)
Eosinophils Absolute: 0.2 10*3/uL (ref 0.0–0.5)
HCT: 44.6 % (ref 38.4–49.9)
HGB: 15.3 g/dL (ref 13.0–17.1)
LYMPH%: 33.9 % (ref 14.0–49.0)
MCH: 29.4 pg (ref 27.2–33.4)
MCHC: 34.2 g/dL (ref 32.0–36.0)
MCV: 86.2 fL (ref 79.3–98.0)
MONO#: 0.6 10*3/uL (ref 0.1–0.9)
MONO%: 9.4 % (ref 0.0–14.0)
NEUT#: 3.3 10*3/uL (ref 1.5–6.5)
NEUT%: 52.1 % (ref 39.0–75.0)
Platelets: 212 10*3/uL (ref 140–400)
RBC: 5.18 10*6/uL (ref 4.20–5.82)
RDW: 13.4 % (ref 11.0–14.6)
WBC: 6.3 10*3/uL (ref 4.0–10.3)
lymph#: 2.1 10*3/uL (ref 0.9–3.3)

## 2014-06-17 LAB — COMPREHENSIVE METABOLIC PANEL (CC13)
ALT: 46 U/L (ref 0–55)
AST: 30 U/L (ref 5–34)
Albumin: 3.6 g/dL (ref 3.5–5.0)
Alkaline Phosphatase: 84 U/L (ref 40–150)
Anion Gap: 8 mEq/L (ref 3–11)
BUN: 11.7 mg/dL (ref 7.0–26.0)
CO2: 23 mEq/L (ref 22–29)
Calcium: 9.2 mg/dL (ref 8.4–10.4)
Chloride: 109 mEq/L (ref 98–109)
Creatinine: 0.9 mg/dL (ref 0.7–1.3)
Glucose: 124 mg/dl (ref 70–140)
Potassium: 4.3 mEq/L (ref 3.5–5.1)
Sodium: 140 mEq/L (ref 136–145)
Total Bilirubin: 0.56 mg/dL (ref 0.20–1.20)
Total Protein: 6.6 g/dL (ref 6.4–8.3)

## 2014-06-17 MED ORDER — DEXAMETHASONE SODIUM PHOSPHATE 20 MG/5ML IJ SOLN
12.0000 mg | Freq: Once | INTRAMUSCULAR | Status: AC
  Administered 2014-06-17: 12 mg via INTRAVENOUS

## 2014-06-17 MED ORDER — SODIUM CHLORIDE 0.9 % IV SOLN
Freq: Once | INTRAVENOUS | Status: AC
  Administered 2014-06-17: 10:00:00 via INTRAVENOUS

## 2014-06-17 MED ORDER — SODIUM CHLORIDE 0.9 % IV SOLN
150.0000 mg | Freq: Once | INTRAVENOUS | Status: AC
  Administered 2014-06-17: 150 mg via INTRAVENOUS
  Filled 2014-06-17: qty 5

## 2014-06-17 MED ORDER — DEXAMETHASONE SODIUM PHOSPHATE 20 MG/5ML IJ SOLN
INTRAMUSCULAR | Status: AC
  Filled 2014-06-17: qty 5

## 2014-06-17 MED ORDER — SODIUM CHLORIDE 0.9 % IV SOLN
1000.0000 mg/m2 | Freq: Once | INTRAVENOUS | Status: AC
  Administered 2014-06-17: 2204 mg via INTRAVENOUS
  Filled 2014-06-17: qty 57.97

## 2014-06-17 MED ORDER — SODIUM CHLORIDE 0.9 % IV SOLN
75.0000 mg/m2 | Freq: Once | INTRAVENOUS | Status: AC
  Administered 2014-06-17: 167 mg via INTRAVENOUS
  Filled 2014-06-17: qty 167

## 2014-06-17 MED ORDER — PALONOSETRON HCL INJECTION 0.25 MG/5ML
0.2500 mg | Freq: Once | INTRAVENOUS | Status: AC
  Administered 2014-06-17: 0.25 mg via INTRAVENOUS

## 2014-06-17 MED ORDER — POTASSIUM CHLORIDE 2 MEQ/ML IV SOLN
Freq: Once | INTRAVENOUS | Status: AC
  Administered 2014-06-17: 10:00:00 via INTRAVENOUS
  Filled 2014-06-17: qty 10

## 2014-06-17 MED ORDER — PROCHLORPERAZINE MALEATE 10 MG PO TABS: 10.0000 mg | ORAL_TABLET | Freq: Four times a day (QID) | ORAL | Status: DC | PRN

## 2014-06-17 MED ORDER — PALONOSETRON HCL INJECTION 0.25 MG/5ML
INTRAVENOUS | Status: AC
  Filled 2014-06-17: qty 5

## 2014-06-17 NOTE — Telephone Encounter (Signed)
gv and printed appt sched and avs for pt for OCT and NOV....sed added tx. °

## 2014-06-17 NOTE — Patient Instructions (Addendum)
[facility] Discharge Instructions for Patients Receiving Chemotherapy  Today you received the following chemotherapy agents Cisplatin and Gemzar  To help prevent nausea and vomiting after your treatment, we encourage you to take your nausea medication Compazine 10mg  every 6 hours as needed for nausea. Hold Zofran for 72 after treatment because you have been given both Aloxi and Emend which lasts 3 days. Then you may start Zofran 8mg  every 8 hours as needed for nausea. This can be alternated with Compazine.   If you develop nausea and vomiting that is not controlled by your nausea medication, call the clinic.   BELOW ARE SYMPTOMS THAT SHOULD BE REPORTED IMMEDIATELY:  *FEVER GREATER THAN 100.5 F  *CHILLS WITH OR WITHOUT FEVER  NAUSEA AND VOMITING THAT IS NOT CONTROLLED WITH YOUR NAUSEA MEDICATION  *UNUSUAL SHORTNESS OF BREATH  *UNUSUAL BRUISING OR BLEEDING  TENDERNESS IN MOUTH AND THROAT WITH OR WITHOUT PRESENCE OF ULCERS  *URINARY PROBLEMS  *BOWEL PROBLEMS  UNUSUAL RASH Items with * indicate a potential emergency and should be followed up as soon as possible.  Feel free to call the clinic you have any questions or concerns. The clinic phone number is [phone].

## 2014-06-17 NOTE — Progress Notes (Signed)
Hematology and Oncology Follow Up Visit  Danny Wood [id number] 09/17/1950 63 y.o. 06/17/2014 8:49 AM Danny Wood, MDSwayne, [name], MD   Principle Diagnosis: 63 year old gentleman high-grade, muscle invasive transitional cell carcinoma of the bladder diagnosed in September 2015 after presenting with symptomatic hematuria. His clinical stage is T2 N0.    Prior Therapy: He underwent a cystoscopy on 05/17/2014 which showed extensive bladder wall thickening with carcinoma in situ and possible involvement of the prostatic urethra. Biopsy of the bladder trigone showed a high-grade invasive urothelial carcinoma involving superficial muscle layer extending into the deep edge.   Current therapy: Neoadjuvant systemic chemotherapy in the form of gemcitabine and cisplatin cycle 1 day 1 is on 06/17/2014. Plan for 3 cycles of neoadjuvant chemotherapy total.  Interim History: Danny Wood presents today for a followup visit. Since the last visit, he does not report any new complaints. He attended chemotherapy education class and refused a Port-A-Cath insertion. He is ready to proceed with his first cycle of chemotherapy. He does not report any abdominal pain or flank pain. He does not report any constitutional symptoms of fevers, chills, weight loss or decline in his energy. He does not report any headaches, blurred vision, syncope or seizures. His report any chest pain, shortness of breath, palpitation, orthopnea, PND. He does not report any cough or wheezing. He has not reported any nausea, vomiting, constipation, diarrhea or change in his bowel habits. He does not report any arthralgias or myalgias. He is now referred any lymphadenopathy or petechiae. He does report chronic back pain and paresthesias in his left leg after his recent operation. He continues to have excellent performance status and continued to be active without any decline. Rest of his review of systems unremarkable.     Medications: I have reviewed the patient's current medications.  Current Outpatient Prescriptions  Medication Sig Dispense Refill  . oxyCODONE-acetaminophen (PERCOCET) 5-325 MG per tablet Take 1 tablet by mouth every 4 (four) hours as needed for severe pain.  30 tablet  0  . lidocaine-prilocaine (EMLA) cream Apply 1 application topically as needed. Apply to port before chemotherapy.  30 g  0  . ondansetron (ZOFRAN) 8 MG tablet Take 1 tablet (8 mg total) by mouth every 8 (eight) hours as needed for nausea or vomiting.  20 tablet  0  . phenazopyridine (PYRIDIUM) 200 MG tablet Take 1 tablet (200 mg total) by mouth 3 (three) times daily as needed for pain.  10 tablet  0  . prochlorperazine (COMPAZINE) 10 MG tablet Take 1 tablet (10 mg total) by mouth every 6 (six) hours as needed for nausea or vomiting.  30 tablet  0   No current facility-administered medications for this visit.     Allergies: No Known Allergies  Past Medical History, Surgical history, Social history, and Family History were reviewed and updated.   Physical Exam: Blood pressure 140/64, pulse 61, temperature 97.9 F (36.6 C), temperature source Oral, resp. rate 18, height 6' (1.829 m), weight 215 lb 1.6 oz (97.569 kg). ECOG: 0 General appearance: alert and cooperative Head: Normocephalic, without obvious abnormality Neck: no adenopathy Lymph nodes: Cervical, supraclavicular, and axillary nodes normal. Heart:regular rate and rhythm, S1, S2 normal, no murmur, click, rub or gallop Lung:chest clear, no wheezing, rales, normal symmetric air entry Abdomin: soft, non-tender, without masses or organomegaly EXT:no erythema, induration, or nodules   Lab Results: Lab Results  Component Value Date   WBC 6.3 06/17/2014   HGB 15.3 06/17/2014   HCT 44.6  06/17/2014   MCV 86.2 06/17/2014   PLT 212 06/17/2014     Chemistry      Component Value Date/Time   NA 138 07/03/2012 1606   K 4.2 07/03/2012 1606   CL 102 07/03/2012  1606   CO2 29 07/03/2012 1606   BUN 21 07/03/2012 1606   CREATININE 0.95 07/03/2012 1606      Component Value Date/Time   CALCIUM 9.1 07/03/2012 1606   ALKPHOS 86 07/03/2012 1606   AST 35 07/03/2012 1606   ALT 45 07/03/2012 1606   BILITOT 0.4 07/03/2012 1606       Impression and Plan:  63 year old man with the following issues:  1. High-grade, muscle invasive transitional cell carcinoma of the bladder diagnosed in September 2015 after presenting with symptomatic hematuria. His clinical stage is T2 N0 with disease into the superficial muscle of the bladder wall. The plan is to proceed with neoadjuvant chemotherapy for cycle 11 today on 06/17/2014. Day 8 will be given on 06/24/2014. Cycles will start on 07/08/2014.  2. IV access: She refused a Port-A-Cath insertion and prefers his peripheral veins for the time being.  3. Antiemetics: Prescription for Compazine was given today in addition to Zofran.   4. Followup: Will be in one week for day 8 of cycle 1.   [facility], MD 10/16/20158:49 AM

## 2014-06-24 ENCOUNTER — Ambulatory Visit (HOSPITAL_BASED_OUTPATIENT_CLINIC_OR_DEPARTMENT_OTHER): Payer: BC Managed Care – PPO

## 2014-06-24 ENCOUNTER — Ambulatory Visit (HOSPITAL_BASED_OUTPATIENT_CLINIC_OR_DEPARTMENT_OTHER): Payer: BC Managed Care – PPO | Admitting: Physician Assistant

## 2014-06-24 ENCOUNTER — Other Ambulatory Visit (HOSPITAL_BASED_OUTPATIENT_CLINIC_OR_DEPARTMENT_OTHER): Payer: BC Managed Care – PPO

## 2014-06-24 ENCOUNTER — Encounter: Payer: Self-pay | Admitting: *Deleted

## 2014-06-24 VITALS — BP 149/76 | HR 77 | Temp 98.1°F | Resp 18 | Ht 72.0 in | Wt 208.3 lb

## 2014-06-24 DIAGNOSIS — Z5111 Encounter for antineoplastic chemotherapy: Secondary | ICD-10-CM

## 2014-06-24 DIAGNOSIS — C679 Malignant neoplasm of bladder, unspecified: Secondary | ICD-10-CM

## 2014-06-24 DIAGNOSIS — C670 Malignant neoplasm of trigone of bladder: Secondary | ICD-10-CM

## 2014-06-24 LAB — COMPREHENSIVE METABOLIC PANEL (CC13)
ALT: 130 U/L — ABNORMAL HIGH (ref 0–55)
AST: 45 U/L — ABNORMAL HIGH (ref 5–34)
Albumin: 3.7 g/dL (ref 3.5–5.0)
Alkaline Phosphatase: 94 U/L (ref 40–150)
Anion Gap: 9 mEq/L (ref 3–11)
BUN: 28.3 mg/dL — ABNORMAL HIGH (ref 7.0–26.0)
CO2: 23 mEq/L (ref 22–29)
Calcium: 9.5 mg/dL (ref 8.4–10.4)
Chloride: 102 mEq/L (ref 98–109)
Creatinine: 1.3 mg/dL (ref 0.7–1.3)
Glucose: 221 mg/dl — ABNORMAL HIGH (ref 70–140)
Potassium: 4.4 mEq/L (ref 3.5–5.1)
Sodium: 134 mEq/L — ABNORMAL LOW (ref 136–145)
Total Bilirubin: 0.6 mg/dL (ref 0.20–1.20)
Total Protein: 6.8 g/dL (ref 6.4–8.3)

## 2014-06-24 LAB — CBC WITH DIFFERENTIAL/PLATELET
BASO%: 1.2 % (ref 0.0–2.0)
Basophils Absolute: 0 10*3/uL (ref 0.0–0.1)
EOS%: 1.4 % (ref 0.0–7.0)
Eosinophils Absolute: 0.1 10*3/uL (ref 0.0–0.5)
HCT: 44.1 % (ref 38.4–49.9)
HGB: 15.2 g/dL (ref 13.0–17.1)
LYMPH%: 42.4 % (ref 14.0–49.0)
MCH: 29.3 pg (ref 27.2–33.4)
MCHC: 34.5 g/dL (ref 32.0–36.0)
MCV: 84.9 fL (ref 79.3–98.0)
MONO#: 0.2 10*3/uL (ref 0.1–0.9)
MONO%: 4.8 % (ref 0.0–14.0)
NEUT#: 1.9 10*3/uL (ref 1.5–6.5)
NEUT%: 50.2 % (ref 39.0–75.0)
Platelets: 156 10*3/uL (ref 140–400)
RBC: 5.19 10*6/uL (ref 4.20–5.82)
RDW: 12.7 % (ref 11.0–14.6)
WBC: 3.7 10*3/uL — ABNORMAL LOW (ref 4.0–10.3)
lymph#: 1.6 10*3/uL (ref 0.9–3.3)

## 2014-06-24 MED ORDER — PROCHLORPERAZINE MALEATE 10 MG PO TABS
10.0000 mg | ORAL_TABLET | Freq: Once | ORAL | Status: AC
  Administered 2014-06-24: 10 mg via ORAL

## 2014-06-24 MED ORDER — GEMCITABINE HCL CHEMO INJECTION 1 GM/26.3ML
1000.0000 mg/m2 | Freq: Once | INTRAVENOUS | Status: AC
  Administered 2014-06-24: 2204 mg via INTRAVENOUS
  Filled 2014-06-24: qty 57.97

## 2014-06-24 MED ORDER — PROCHLORPERAZINE MALEATE 10 MG PO TABS
ORAL_TABLET | ORAL | Status: AC
  Filled 2014-06-24: qty 1

## 2014-06-24 MED ORDER — SODIUM CHLORIDE 0.9 % IV SOLN
Freq: Once | INTRAVENOUS | Status: AC
  Administered 2014-06-24: 10:00:00 via INTRAVENOUS

## 2014-06-24 NOTE — Progress Notes (Signed)
[place] Work  Clinical Social Work was referred by nurse for assistance with ADRs.  Clinical Social Worker contacted patient at home and explained the process to him. He is eager to complete ADRs and requests this to be completed at his next chemo appointment on 07/08/14.   Pt denied other needs currently and CSW will see pt on 07/08/14 in the infusion room. Pt very appreciative of the follow up.  Clinical Social Work interventions: ADR education  Loren Racer, [place] Worker [place]  [place] Phone: [phone] Fax: [phone]

## 2014-06-24 NOTE — Patient Instructions (Signed)
[facility] Discharge Instructions for Patients Receiving Chemotherapy  Today you received the following chemotherapy agents Gemzar.  To help prevent nausea and vomiting after your treatment, we encourage you to take your nausea medication as prescribed.   If you develop nausea and vomiting that is not controlled by your nausea medication, call the clinic.   BELOW ARE SYMPTOMS THAT SHOULD BE REPORTED IMMEDIATELY:  *FEVER GREATER THAN 100.5 F  *CHILLS WITH OR WITHOUT FEVER  NAUSEA AND VOMITING THAT IS NOT CONTROLLED WITH YOUR NAUSEA MEDICATION  *UNUSUAL SHORTNESS OF BREATH  *UNUSUAL BRUISING OR BLEEDING  TENDERNESS IN MOUTH AND THROAT WITH OR WITHOUT PRESENCE OF ULCERS  *URINARY PROBLEMS  *BOWEL PROBLEMS  UNUSUAL RASH Items with * indicate a potential emergency and should be followed up as soon as possible.  Feel free to call the clinic you have any questions or concerns. The clinic phone number is [phone].    

## 2014-06-26 NOTE — Patient Instructions (Signed)
Follow up in 2 weeks with start of your next scheduled cycle of chemotherapy

## 2014-06-26 NOTE — Progress Notes (Signed)
Hematology and Oncology Follow Up Visit  Danny Wood [id number] [birth year] 63 y.o. 06/26/2014 9:35 PM SWAYNE,DAVID W, MDSwayne, [name], MD   Principle Diagnosis: 63 year old gentleman high-grade, muscle invasive transitional cell carcinoma of the bladder diagnosed in September 2015 after presenting with symptomatic hematuria. His clinical stage is T2 N0.    Prior Therapy: He underwent a cystoscopy on 05/17/2014 which showed extensive bladder wall thickening with carcinoma in situ and possible involvement of the prostatic urethra. Biopsy of the bladder trigone showed a high-grade invasive urothelial carcinoma involving superficial muscle layer extending into the deep edge.   Current therapy: Neoadjuvant systemic chemotherapy in the form of gemcitabine and cisplatin cycle 1 day 1 is on 06/17/2014. Plan for 3 cycles of neoadjuvant chemotherapy total.  Interim History: Mr. Krempasky presents today for a followup visit. Since the last visit, he does not report any new complaints. He tolerated day 1 of cycle 1 of his systemic chemotherapy with gemcitabine and cisplatin. He did have some constipation but it is currently well managed with over-the-counter stool softeners.  He does not report any abdominal pain or flank pain. He does not report any constitutional symptoms of fevers, chills, weight loss or decline in his energy. He does not report any headaches, blurred vision, syncope or seizures. His report any chest pain, shortness of breath, palpitation, orthopnea, PND. He does not report any cough or wheezing. He has not reported any nausea, vomiting, diarrhea or change in his bowel habits. He does not report any arthralgias or myalgias. He is reporting any lymphadenopathy or petechiae. He does report chronic back pain and paresthesias in his left leg after his recent operation. He continues to have excellent performance status and continued to be active without any decline. Remainder of his  review of systems unremarkable.    Medications: I have reviewed the patient's current medications.  Current Outpatient Prescriptions  Medication Sig Dispense Refill  . oxyCODONE-acetaminophen (PERCOCET) 5-325 MG per tablet Take 1 tablet by mouth every 4 (four) hours as needed for severe pain.  30 tablet  0  . prochlorperazine (COMPAZINE) 10 MG tablet Take 1 tablet (10 mg total) by mouth every 6 (six) hours as needed for nausea or vomiting.  30 tablet  0  . lidocaine-prilocaine (EMLA) cream Apply 1 application topically as needed. Apply to port before chemotherapy.  30 g  0  . ondansetron (ZOFRAN) 8 MG tablet Take 1 tablet (8 mg total) by mouth every 8 (eight) hours as needed for nausea or vomiting.  20 tablet  0  . phenazopyridine (PYRIDIUM) 200 MG tablet Take 1 tablet (200 mg total) by mouth 3 (three) times daily as needed for pain.  10 tablet  0   No current facility-administered medications for this visit.     Allergies: No Known Allergies  Past Medical History, Surgical history, Social history, and Family History were reviewed and updated.   Physical Exam: Blood pressure 149/76, pulse 77, temperature 98.1 F (36.7 C), temperature source Oral, resp. rate 18, height 6' (1.829 m), weight 208 lb 4.8 oz (94.484 kg). ECOG: 0 General appearance: alert and cooperative Head: Normocephalic, without obvious abnormality Neck: no adenopathy Lymph nodes: Cervical, supraclavicular, and axillary nodes normal. Heart:regular rate and rhythm, S1, S2 normal, no murmur, click, rub or gallop Lung:chest clear, no wheezing, rales, normal symmetric air entry Abdomin: soft, non-tender, without masses or organomegaly EXT:no erythema, induration, or nodules   Lab Results: Lab Results  Component Value Date   WBC 3.7* 06/24/2014  HGB 15.2 06/24/2014   HCT 44.1 06/24/2014   MCV 84.9 06/24/2014   PLT 156 06/24/2014     Chemistry      Component Value Date/Time   NA 134* 06/24/2014 0853   NA 138  07/03/2012 1606   K 4.4 06/24/2014 0853   K 4.2 07/03/2012 1606   CL 102 07/03/2012 1606   CO2 23 06/24/2014 0853   CO2 29 07/03/2012 1606   BUN 28.3* 06/24/2014 0853   BUN 21 07/03/2012 1606   CREATININE 1.3 06/24/2014 0853   CREATININE 0.95 07/03/2012 1606      Component Value Date/Time   CALCIUM 9.5 06/24/2014 0853   CALCIUM 9.1 07/03/2012 1606   ALKPHOS 94 06/24/2014 0853   ALKPHOS 86 07/03/2012 1606   AST 45* 06/24/2014 0853   AST 35 07/03/2012 1606   ALT 130* 06/24/2014 0853   ALT 45 07/03/2012 1606   BILITOT 0.60 06/24/2014 0853   BILITOT 0.4 07/03/2012 1606       Impression and Plan:  63 year old man with the following issues:  1. High-grade, muscle invasive transitional cell carcinoma of the bladder diagnosed in September 2015 after presenting with symptomatic hematuria. His clinical stage is T2 N0 with disease into the superficial muscle of the bladder wall. The plan is to proceed with neoadjuvant chemotherapy. He is status post day 1 of cycle #1. He will proceed with day 8 for cycle #1 today as scheduled. Cycle #2 will start on 07/08/2014.  2. IV access: She refused a Port-A-Cath insertion and prefers his peripheral veins for the time being.  3. Antiemetics: Prescription for Compazine was given today in addition to Zofran.   4. Followup: Will be in 2 weeks for day 1 of cycle 2.   Wynetta Emery, Kendan Cornforth E, PA-C  10/25/20159:35 PM

## 2014-06-29 ENCOUNTER — Other Ambulatory Visit: Payer: Self-pay | Admitting: Urology

## 2014-07-08 ENCOUNTER — Ambulatory Visit (HOSPITAL_BASED_OUTPATIENT_CLINIC_OR_DEPARTMENT_OTHER): Payer: BC Managed Care – PPO

## 2014-07-08 ENCOUNTER — Other Ambulatory Visit (HOSPITAL_BASED_OUTPATIENT_CLINIC_OR_DEPARTMENT_OTHER): Payer: BC Managed Care – PPO

## 2014-07-08 ENCOUNTER — Ambulatory Visit (HOSPITAL_BASED_OUTPATIENT_CLINIC_OR_DEPARTMENT_OTHER): Payer: BC Managed Care – PPO | Admitting: Oncology

## 2014-07-08 ENCOUNTER — Telehealth: Payer: Self-pay | Admitting: Oncology

## 2014-07-08 VITALS — BP 139/74 | HR 60 | Temp 97.9°F | Resp 18 | Ht 72.0 in | Wt 215.1 lb

## 2014-07-08 DIAGNOSIS — C679 Malignant neoplasm of bladder, unspecified: Secondary | ICD-10-CM

## 2014-07-08 DIAGNOSIS — Z5111 Encounter for antineoplastic chemotherapy: Secondary | ICD-10-CM

## 2014-07-08 LAB — CBC WITH DIFFERENTIAL/PLATELET
BASO%: 0.2 % (ref 0.0–2.0)
Basophils Absolute: 0 10*3/uL (ref 0.0–0.1)
EOS%: 1.2 % (ref 0.0–7.0)
Eosinophils Absolute: 0.1 10*3/uL (ref 0.0–0.5)
HCT: 36.5 % — ABNORMAL LOW (ref 38.4–49.9)
HGB: 13.2 g/dL (ref 13.0–17.1)
LYMPH%: 43.9 % (ref 14.0–49.0)
MCH: 29.7 pg (ref 27.2–33.4)
MCHC: 36.2 g/dL — ABNORMAL HIGH (ref 32.0–36.0)
MCV: 82 fL (ref 79.3–98.0)
MONO#: 0.8 10*3/uL (ref 0.1–0.9)
MONO%: 18.6 % — ABNORMAL HIGH (ref 0.0–14.0)
NEUT#: 1.5 10*3/uL (ref 1.5–6.5)
NEUT%: 36.1 % — ABNORMAL LOW (ref 39.0–75.0)
Platelets: 442 10*3/uL — ABNORMAL HIGH (ref 140–400)
RBC: 4.45 10*6/uL (ref 4.20–5.82)
RDW: 12.4 % (ref 11.0–14.6)
WBC: 4 10*3/uL (ref 4.0–10.3)
lymph#: 1.8 10*3/uL (ref 0.9–3.3)

## 2014-07-08 LAB — COMPREHENSIVE METABOLIC PANEL (CC13)
ALT: 56 U/L — ABNORMAL HIGH (ref 0–55)
AST: 33 U/L (ref 5–34)
Albumin: 3.7 g/dL (ref 3.5–5.0)
Alkaline Phosphatase: 94 U/L (ref 40–150)
Anion Gap: 8 mEq/L (ref 3–11)
BUN: 11.9 mg/dL (ref 7.0–26.0)
CO2: 24 mEq/L (ref 22–29)
Calcium: 8.7 mg/dL (ref 8.4–10.4)
Chloride: 106 mEq/L (ref 98–109)
Creatinine: 1 mg/dL (ref 0.7–1.3)
Glucose: 117 mg/dl (ref 70–140)
Potassium: 4.5 mEq/L (ref 3.5–5.1)
Sodium: 137 mEq/L (ref 136–145)
Total Bilirubin: 0.34 mg/dL (ref 0.20–1.20)
Total Protein: 6.4 g/dL (ref 6.4–8.3)

## 2014-07-08 MED ORDER — POTASSIUM CHLORIDE 2 MEQ/ML IV SOLN
Freq: Once | INTRAVENOUS | Status: AC
  Administered 2014-07-08: 10:00:00 via INTRAVENOUS
  Filled 2014-07-08: qty 10

## 2014-07-08 MED ORDER — SODIUM CHLORIDE 0.9 % IV SOLN
150.0000 mg | Freq: Once | INTRAVENOUS | Status: AC
  Administered 2014-07-08: 150 mg via INTRAVENOUS
  Filled 2014-07-08: qty 5

## 2014-07-08 MED ORDER — SODIUM CHLORIDE 0.9 % IV SOLN
1000.0000 mg/m2 | Freq: Once | INTRAVENOUS | Status: AC
  Administered 2014-07-08: 2204 mg via INTRAVENOUS
  Filled 2014-07-08: qty 57.97

## 2014-07-08 MED ORDER — SODIUM CHLORIDE 0.9 % IV SOLN
Freq: Once | INTRAVENOUS | Status: AC
  Administered 2014-07-08: 10:00:00 via INTRAVENOUS

## 2014-07-08 MED ORDER — SODIUM CHLORIDE 0.9 % IV SOLN
75.0000 mg/m2 | Freq: Once | INTRAVENOUS | Status: AC
  Administered 2014-07-08: 167 mg via INTRAVENOUS
  Filled 2014-07-08: qty 167

## 2014-07-08 MED ORDER — PALONOSETRON HCL INJECTION 0.25 MG/5ML
0.2500 mg | Freq: Once | INTRAVENOUS | Status: AC
  Administered 2014-07-08: 0.25 mg via INTRAVENOUS

## 2014-07-08 MED ORDER — DEXAMETHASONE SODIUM PHOSPHATE 20 MG/5ML IJ SOLN
INTRAMUSCULAR | Status: AC
  Filled 2014-07-08: qty 5

## 2014-07-08 MED ORDER — PALONOSETRON HCL INJECTION 0.25 MG/5ML
INTRAVENOUS | Status: AC
  Filled 2014-07-08: qty 5

## 2014-07-08 MED ORDER — DEXAMETHASONE SODIUM PHOSPHATE 20 MG/5ML IJ SOLN
12.0000 mg | Freq: Once | INTRAMUSCULAR | Status: AC
  Administered 2014-07-08: 12 mg via INTRAVENOUS

## 2014-07-08 NOTE — Progress Notes (Signed)
Hematology and Oncology Follow Up Visit  Danny Wood [id number] [birth year] 63 y.o. 07/08/2014 9:15 AM Gara Kroner, MDSwayne, [name], MD   Principle Diagnosis: 63 year old gentleman high-grade, muscle invasive transitional cell carcinoma of the bladder diagnosed in September 2015 after presenting with symptomatic hematuria. His clinical stage is T2 N0.    Prior Therapy: He underwent a cystoscopy on 05/17/2014 which showed extensive bladder wall thickening with carcinoma in situ and possible involvement of the prostatic urethra. Biopsy of the bladder trigone showed a high-grade invasive urothelial carcinoma involving superficial muscle layer extending into the deep edge.   Current therapy: Neoadjuvant systemic chemotherapy in the form of gemcitabine and cisplatin cycle 1 day 1 is on 06/17/2014. Plan for 3 cycles of neoadjuvant chemotherapy total. He is here for cycle 2 day 1.  Interim History: [name] presents today for a followup visit. Since the last visit, he continues to tolerate chemotherapy without any complications. He does report grade 1 fatigue and grade 1 nausea. He has not had to take any antiemetics.He is ready to proceed with the next  cycle of chemotherapy. He does not report any abdominal pain or flank pain. He does not report any constitutional symptoms of fevers, chills, weight loss or decline in his energy. He does not report any headaches, blurred vision, syncope or seizures. His report any chest pain, shortness of breath, palpitation, orthopnea, PND. He does not report any cough or wheezing. He has not reported any nausea, vomiting, constipation, diarrhea or change in his bowel habits. He does not report any arthralgias or myalgias. He is now referred any lymphadenopathy or petechiae. He does report chronic back pain and paresthesias in his left leg after his recent operation. He continues to have excellent performance status and continued to be active without  any decline. Rest of his review of systems unremarkable.    Medications: I have reviewed the patient's current medications.  Current Outpatient Prescriptions  Medication Sig Dispense Refill  . ondansetron (ZOFRAN) 8 MG tablet Take 1 tablet (8 mg total) by mouth every 8 (eight) hours as needed for nausea or vomiting. 20 tablet 0  . oxyCODONE-acetaminophen (PERCOCET) 5-325 MG per tablet Take 1 tablet by mouth every 4 (four) hours as needed for severe pain. 30 tablet 0  . phenazopyridine (PYRIDIUM) 200 MG tablet Take 1 tablet (200 mg total) by mouth 3 (three) times daily as needed for pain. 10 tablet 0  . prochlorperazine (COMPAZINE) 10 MG tablet Take 1 tablet (10 mg total) by mouth every 6 (six) hours as needed for nausea or vomiting. 30 tablet 0   No current facility-administered medications for this visit.     Allergies: No Known Allergies  Past Medical History, Surgical history, Social history, and Family History were reviewed and updated.   Physical Exam: Blood pressure 139/74, pulse 60, temperature 97.9 F (36.6 C), temperature source Oral, resp. rate 18, height 6' (1.829 m), weight 215 lb 1.6 oz (97.569 kg), SpO2 98 %. ECOG: 0 General appearance: alert and cooperative Head: Normocephalic, without obvious abnormality Neck: no adenopathy Lymph nodes: Cervical, supraclavicular, and axillary nodes normal. Heart:regular rate and rhythm, S1, S2 normal, no murmur, click, rub or gallop Lung:chest clear, no wheezing, rales, normal symmetric air entry Abdomin: soft, non-tender, without masses or organomegaly EXT:no erythema, induration, or nodules   Lab Results: Lab Results  Component Value Date   WBC 4.0 07/08/2014   HGB 13.2 07/08/2014   HCT 36.5* 07/08/2014   MCV 82.0 07/08/2014   PLT  442* 07/08/2014     Chemistry      Component Value Date/Time   NA 134* 06/24/2014 0853   NA 138 07/03/2012 1606   K 4.4 06/24/2014 0853   K 4.2 07/03/2012 1606   CL 102 07/03/2012 1606    CO2 23 06/24/2014 0853   CO2 29 07/03/2012 1606   BUN 28.3* 06/24/2014 0853   BUN 21 07/03/2012 1606   CREATININE 1.3 06/24/2014 0853   CREATININE 0.95 07/03/2012 1606      Component Value Date/Time   CALCIUM 9.5 06/24/2014 0853   CALCIUM 9.1 07/03/2012 1606   ALKPHOS 94 06/24/2014 0853   ALKPHOS 86 07/03/2012 1606   AST 45* 06/24/2014 0853   AST 35 07/03/2012 1606   ALT 130* 06/24/2014 0853   ALT 45 07/03/2012 1606   BILITOT 0.60 06/24/2014 0853   BILITOT 0.4 07/03/2012 1606       Impression and Plan:  63 year old man with the following issues:  1. High-grade, muscle invasive transitional cell carcinoma of the bladder diagnosed in September 2015 after presenting with symptomatic hematuria. His clinical stage is T2 N0 with disease into the superficial muscle of the bladder wall. His laboratory data were reviewed today and appears adequate. The plan is to proceed with cycle 2 day 1 today without any delay.  2. IV access: She refused a Port-A-Cath insertion and prefers his peripheral veins for the time being.  3. Antiemetics: Prescription for Compazine was given today in addition to Zofran.   4. Followup: Will be in one week for day 8 of cycle 2.   GQQPYP,PJKDT, MD 11/6/20159:15 AM

## 2014-07-08 NOTE — Patient Instructions (Signed)
[facility] Discharge Instructions for Patients Receiving Chemotherapy  Today you received the following chemotherapy agents Cisplatin/Gemzar.   To help prevent nausea and vomiting after your treatment, we encourage you to take your nausea medication as directed.    If you develop nausea and vomiting that is not controlled by your nausea medication, call the clinic.   BELOW ARE SYMPTOMS THAT SHOULD BE REPORTED IMMEDIATELY:  *FEVER GREATER THAN 100.5 F  *CHILLS WITH OR WITHOUT FEVER  NAUSEA AND VOMITING THAT IS NOT CONTROLLED WITH YOUR NAUSEA MEDICATION  *UNUSUAL SHORTNESS OF BREATH  *UNUSUAL BRUISING OR BLEEDING  TENDERNESS IN MOUTH AND THROAT WITH OR WITHOUT PRESENCE OF ULCERS  *URINARY PROBLEMS  *BOWEL PROBLEMS  UNUSUAL RASH Items with * indicate a potential emergency and should be followed up as soon as possible.  Feel free to call the clinic you have any questions or concerns. The clinic phone number is [phone].

## 2014-07-08 NOTE — Telephone Encounter (Signed)
gv adn printed appt sched adn avs for pt for NOV adn DEC...sed added tx.

## 2014-07-15 ENCOUNTER — Other Ambulatory Visit (HOSPITAL_BASED_OUTPATIENT_CLINIC_OR_DEPARTMENT_OTHER): Payer: BC Managed Care – PPO

## 2014-07-15 ENCOUNTER — Ambulatory Visit (HOSPITAL_BASED_OUTPATIENT_CLINIC_OR_DEPARTMENT_OTHER): Payer: BC Managed Care – PPO | Admitting: Physician Assistant

## 2014-07-15 ENCOUNTER — Ambulatory Visit (HOSPITAL_BASED_OUTPATIENT_CLINIC_OR_DEPARTMENT_OTHER): Payer: BC Managed Care – PPO

## 2014-07-15 VITALS — BP 124/75 | HR 81 | Temp 98.4°F | Resp 18 | Ht 72.0 in | Wt 205.2 lb

## 2014-07-15 DIAGNOSIS — C670 Malignant neoplasm of trigone of bladder: Secondary | ICD-10-CM | POA: Diagnosis not present

## 2014-07-15 DIAGNOSIS — Z5111 Encounter for antineoplastic chemotherapy: Secondary | ICD-10-CM

## 2014-07-15 DIAGNOSIS — C679 Malignant neoplasm of bladder, unspecified: Secondary | ICD-10-CM

## 2014-07-15 LAB — COMPREHENSIVE METABOLIC PANEL (CC13)
ALT: 229 U/L — ABNORMAL HIGH (ref 0–55)
AST: 74 U/L — ABNORMAL HIGH (ref 5–34)
Albumin: 3.9 g/dL (ref 3.5–5.0)
Alkaline Phosphatase: 99 U/L (ref 40–150)
Anion Gap: 10 mEq/L (ref 3–11)
BUN: 37.6 mg/dL — ABNORMAL HIGH (ref 7.0–26.0)
CO2: 23 mEq/L (ref 22–29)
Calcium: 9.5 mg/dL (ref 8.4–10.4)
Chloride: 100 mEq/L (ref 98–109)
Creatinine: 1.7 mg/dL — ABNORMAL HIGH (ref 0.7–1.3)
Glucose: 195 mg/dl — ABNORMAL HIGH (ref 70–140)
Potassium: 4.1 mEq/L (ref 3.5–5.1)
Sodium: 134 mEq/L — ABNORMAL LOW (ref 136–145)
Total Bilirubin: 0.58 mg/dL (ref 0.20–1.20)
Total Protein: 6.8 g/dL (ref 6.4–8.3)

## 2014-07-15 LAB — CBC WITH DIFFERENTIAL/PLATELET
BASO%: 1.3 % (ref 0.0–2.0)
Basophils Absolute: 0 10*3/uL (ref 0.0–0.1)
EOS%: 0.5 % (ref 0.0–7.0)
Eosinophils Absolute: 0 10*3/uL (ref 0.0–0.5)
HCT: 38.5 % (ref 38.4–49.9)
HGB: 13.8 g/dL (ref 13.0–17.1)
LYMPH%: 45.8 % (ref 14.0–49.0)
MCH: 29.7 pg (ref 27.2–33.4)
MCHC: 35.8 g/dL (ref 32.0–36.0)
MCV: 82.8 fL (ref 79.3–98.0)
MONO#: 0.3 10*3/uL (ref 0.1–0.9)
MONO%: 7.8 % (ref 0.0–14.0)
NEUT#: 1.5 10*3/uL (ref 1.5–6.5)
NEUT%: 44.6 % (ref 39.0–75.0)
Platelets: 383 10*3/uL (ref 140–400)
RBC: 4.65 10*6/uL (ref 4.20–5.82)
RDW: 12.9 % (ref 11.0–14.6)
WBC: 3.4 10*3/uL — ABNORMAL LOW (ref 4.0–10.3)
lymph#: 1.5 10*3/uL (ref 0.9–3.3)

## 2014-07-15 MED ORDER — SODIUM CHLORIDE 0.9 % IV SOLN
1000.0000 mg/m2 | Freq: Once | INTRAVENOUS | Status: AC
  Administered 2014-07-15: 2204 mg via INTRAVENOUS
  Filled 2014-07-15: qty 57.97

## 2014-07-15 MED ORDER — SODIUM CHLORIDE 0.9 % IV SOLN
Freq: Once | INTRAVENOUS | Status: AC
  Administered 2014-07-15: 11:00:00 via INTRAVENOUS

## 2014-07-15 MED ORDER — PROCHLORPERAZINE MALEATE 10 MG PO TABS
10.0000 mg | ORAL_TABLET | Freq: Once | ORAL | Status: AC
Start: 2014-07-15 — End: 2014-07-15
  Administered 2014-07-15: 10 mg via ORAL

## 2014-07-15 MED ORDER — PROCHLORPERAZINE MALEATE 10 MG PO TABS
ORAL_TABLET | ORAL | Status: AC
  Filled 2014-07-15: qty 1

## 2014-07-15 NOTE — Patient Instructions (Signed)
[facility] Discharge Instructions for Patients Receiving Chemotherapy  Today you received the following chemotherapy agents Gemzar.  To help prevent nausea and vomiting after your treatment, we encourage you to take your nausea medication.   If you develop nausea and vomiting that is not controlled by your nausea medication, call the clinic.   BELOW ARE SYMPTOMS THAT SHOULD BE REPORTED IMMEDIATELY:  *FEVER GREATER THAN 100.5 F  *CHILLS WITH OR WITHOUT FEVER  NAUSEA AND VOMITING THAT IS NOT CONTROLLED WITH YOUR NAUSEA MEDICATION  *UNUSUAL SHORTNESS OF BREATH  *UNUSUAL BRUISING OR BLEEDING  TENDERNESS IN MOUTH AND THROAT WITH OR WITHOUT PRESENCE OF ULCERS  *URINARY PROBLEMS  *BOWEL PROBLEMS  UNUSUAL RASH Items with * indicate a potential emergency and should be followed up as soon as possible.  Feel free to call the clinic you have any questions or concerns. The clinic phone number is [phone].    

## 2014-07-17 NOTE — Progress Notes (Signed)
Hematology and Oncology Follow Up Visit  Danny Wood [id number] [birth year] 63 y.o. 07/17/2014 12:12 PM SWAYNE,DAVID W, MDSwayne, [name], MD   Principle Diagnosis: 63 year old gentleman high-grade, muscle invasive transitional cell carcinoma of the bladder diagnosed in September 2015 after presenting with symptomatic hematuria. His clinical stage is T2 N0.    Prior Therapy: He underwent a cystoscopy on 05/17/2014 which showed extensive bladder wall thickening with carcinoma in situ and possible involvement of the prostatic urethra. Biopsy of the bladder trigone showed a high-grade invasive urothelial carcinoma involving superficial muscle layer extending into the deep edge.   Current therapy: Neoadjuvant systemic chemotherapy in the form of gemcitabine and cisplatin cycle 1 day 1 is on 06/17/2014. Plan for 3 cycles of neoadjuvant chemotherapy total. He is here for cycle 2 day 1.  Interim History: [name] presents today for a followup visit. He states that after day 1 of cycle 2 of chemotherapy he spent 4 days in bed. He had significant malaise and fatigue. He reports ringing/buzzing in his ears with heightened hearing sensitivity particularly to mechanical sounds. He also reports visual sensitivity to light. He's had some stomach soreness without vomiting. He's had some increased gaseousness. He is also experiencing some constipation. His by mouth intake is decreased by about a third particularly the 4 days that he spend that he really didn't eat very much. He's having some issues with constipation. His also having some hoarseness in his voice since starting chemotherapy. He does not report any headaches, blurred vision, syncope or seizures. His report any chest pain, shortness of breath, palpitation, orthopnea, PND. He does not report any cough or wheezing. He has not reported any nausea, vomiting,  diarrhea or change in his bowel habits. He does not report any arthralgias or  myalgias. He has not observed any lymphadenopathy or petechiae. He does report chronic back pain and paresthesias in his left leg after his recent operation. Remainder of his review of systems unremarkable.    Medications: I have reviewed the patient's current medications.  Current Outpatient Prescriptions  Medication Sig Dispense Refill  . oxyCODONE-acetaminophen (PERCOCET) 5-325 MG per tablet Take 1 tablet by mouth every 4 (four) hours as needed for severe pain. 30 tablet 0  . prochlorperazine (COMPAZINE) 10 MG tablet Take 1 tablet (10 mg total) by mouth every 6 (six) hours as needed for nausea or vomiting. 30 tablet 0  . ondansetron (ZOFRAN) 8 MG tablet Take 1 tablet (8 mg total) by mouth every 8 (eight) hours as needed for nausea or vomiting. 20 tablet 0  . phenazopyridine (PYRIDIUM) 200 MG tablet Take 1 tablet (200 mg total) by mouth 3 (three) times daily as needed for pain. 10 tablet 0   No current facility-administered medications for this visit.     Allergies: No Known Allergies  Past Medical History, Surgical history, Social history, and Family History were reviewed and updated.   Physical Exam: Blood pressure 124/75, pulse 81, temperature 98.4 F (36.9 C), temperature source Oral, resp. rate 18, height 6' (1.829 m), weight 205 lb 3.2 oz (93.078 kg), SpO2 100 %.   ECOG: 0  General appearance: alert and cooperative Head: Normocephalic, without obvious abnormality Neck: no adenopathy Lymph nodes: Cervical, supraclavicular, and axillary nodes normal. Heart:regular rate and rhythm, S1, S2 normal, no murmur, click, rub or gallop Lung:chest clear, no wheezing, rales, normal symmetric air entry Abdomin: soft, non-tender, without masses or organomegaly EXT:no erythema, induration, or nodules Gross hearing test: Patient is able to discern a whisper  and a fine rustlel of fingers in both ears.  Lab Results: Lab Results  Component Value Date   WBC 3.4* 07/15/2014   HGB 13.8  07/15/2014   HCT 38.5 07/15/2014   MCV 82.8 07/15/2014   PLT 383 07/15/2014     Chemistry      Component Value Date/Time   NA 134* 07/15/2014 0911   NA 138 07/03/2012 1606   K 4.1 07/15/2014 0911   K 4.2 07/03/2012 1606   CL 102 07/03/2012 1606   CO2 23 07/15/2014 0911   CO2 29 07/03/2012 1606   BUN 37.6* 07/15/2014 0911   BUN 21 07/03/2012 1606   CREATININE 1.7* 07/15/2014 0911   CREATININE 0.95 07/03/2012 1606      Component Value Date/Time   CALCIUM 9.5 07/15/2014 0911   CALCIUM 9.1 07/03/2012 1606   ALKPHOS 99 07/15/2014 0911   ALKPHOS 86 07/03/2012 1606   AST 74* 07/15/2014 0911   AST 35 07/03/2012 1606   ALT 229* 07/15/2014 0911   ALT 45 07/03/2012 1606   BILITOT 0.58 07/15/2014 0911   BILITOT 0.4 07/03/2012 1606       Impression and Plan:  63 year old man with the following issues:  1. High-grade, muscle invasive transitional cell carcinoma of the bladder diagnosed in September 2015 after presenting with symptomatic hematuria. His clinical stage is T2 N0 with disease into the superficial muscle of the bladder wall. His laboratory data were reviewed. The plan is to proceed with cycle 2 day 8 today without any delay.The patient was discussed with and also seen by Dr. Alen Blew. He will likely stop at 2 complete cycles of neoadjuvant chemotherapy with cisplatin and gemcitabine. He's had an increase in his creatinine as well as the hearing changes.  2. IV access: He refused a Port-A-Cath insertion and prefers his peripheral veins for the time being.  3. Antiemetics: Continue Compazine in addition to Zofran.   4. Followup: Will be with Dr. Alen Blew.on 07/29/2014.  Awilda Metro E, PA-C  11/15/201512:12 PM

## 2014-07-17 NOTE — Patient Instructions (Signed)
Follow-up in 2 weeks

## 2014-07-29 ENCOUNTER — Ambulatory Visit: Payer: BC Managed Care – PPO

## 2014-07-29 ENCOUNTER — Other Ambulatory Visit (HOSPITAL_BASED_OUTPATIENT_CLINIC_OR_DEPARTMENT_OTHER): Payer: BC Managed Care – PPO

## 2014-07-29 ENCOUNTER — Ambulatory Visit (HOSPITAL_BASED_OUTPATIENT_CLINIC_OR_DEPARTMENT_OTHER): Payer: BC Managed Care – PPO | Admitting: Oncology

## 2014-07-29 ENCOUNTER — Telehealth: Payer: Self-pay | Admitting: Oncology

## 2014-07-29 VITALS — BP 148/69 | HR 65 | Temp 98.2°F | Resp 18 | Ht 72.0 in | Wt 207.9 lb

## 2014-07-29 DIAGNOSIS — C670 Malignant neoplasm of trigone of bladder: Secondary | ICD-10-CM

## 2014-07-29 DIAGNOSIS — C679 Malignant neoplasm of bladder, unspecified: Secondary | ICD-10-CM

## 2014-07-29 LAB — CBC WITH DIFFERENTIAL/PLATELET
BASO%: 0.4 % (ref 0.0–2.0)
Basophils Absolute: 0 10*3/uL (ref 0.0–0.1)
EOS%: 1 % (ref 0.0–7.0)
Eosinophils Absolute: 0.1 10*3/uL (ref 0.0–0.5)
HCT: 32.9 % — ABNORMAL LOW (ref 38.4–49.9)
HGB: 11.4 g/dL — ABNORMAL LOW (ref 13.0–17.1)
LYMPH%: 30.2 % (ref 14.0–49.0)
MCH: 29.2 pg (ref 27.2–33.4)
MCHC: 34.6 g/dL (ref 32.0–36.0)
MCV: 84.5 fL (ref 79.3–98.0)
MONO#: 0.9 10*3/uL (ref 0.1–0.9)
MONO%: 18 % — ABNORMAL HIGH (ref 0.0–14.0)
NEUT#: 2.6 10*3/uL (ref 1.5–6.5)
NEUT%: 50.4 % (ref 39.0–75.0)
Platelets: 258 10*3/uL (ref 140–400)
RBC: 3.89 10*6/uL — ABNORMAL LOW (ref 4.20–5.82)
RDW: 13 % (ref 11.0–14.6)
WBC: 5.2 10*3/uL (ref 4.0–10.3)
lymph#: 1.6 10*3/uL (ref 0.9–3.3)

## 2014-07-29 LAB — COMPREHENSIVE METABOLIC PANEL (CC13)
ALT: 68 U/L — ABNORMAL HIGH (ref 0–55)
AST: 43 U/L — ABNORMAL HIGH (ref 5–34)
Albumin: 3.7 g/dL (ref 3.5–5.0)
Alkaline Phosphatase: 101 U/L (ref 40–150)
Anion Gap: 9 mEq/L (ref 3–11)
BUN: 22.6 mg/dL (ref 7.0–26.0)
CO2: 24 mEq/L (ref 22–29)
Calcium: 8.9 mg/dL (ref 8.4–10.4)
Chloride: 105 mEq/L (ref 98–109)
Creatinine: 1.2 mg/dL (ref 0.7–1.3)
Glucose: 128 mg/dl (ref 70–140)
Potassium: 4.5 mEq/L (ref 3.5–5.1)
Sodium: 138 mEq/L (ref 136–145)
Total Bilirubin: 0.45 mg/dL (ref 0.20–1.20)
Total Protein: 6.2 g/dL — ABNORMAL LOW (ref 6.4–8.3)

## 2014-07-29 NOTE — Progress Notes (Signed)
Hematology and Oncology Follow Up Visit  Danny Wood [id number] 1951/05/23 63 y.o. 07/29/2014 9:30 AM Danny Wood, MDSwayne, Danny Green, MD   Principle Diagnosis: 63 year old gentleman high-grade, muscle invasive transitional cell carcinoma of the bladder diagnosed in September 2015 after presenting with symptomatic hematuria. His clinical stage is T2 N0.    Prior Therapy: He underwent a cystoscopy on 05/17/2014 which showed extensive bladder wall thickening with carcinoma in situ and possible involvement of the prostatic urethra. Biopsy of the bladder trigone showed a high-grade invasive urothelial carcinoma involving superficial muscle layer extending into the deep edge.   Current therapy: Neoadjuvant systemic chemotherapy in the form of gemcitabine and cisplatin cycle 1 day 1 is on 06/17/2014. Plan for 3 cycles of neoadjuvant chemotherapy total. He is here for cycle 3 day 1.  Interim History: Danny Wood presents today for a followup visit. Since the last visit, he continues to tolerate chemotherapy poorly. He has reported fatigue and tiredness. He has reported anticipatory nausea and anxiety. He he also reported dyspnea on exertion and palpitation. He did report improvement in his symptoms off chemotherapy. He reports ringing/buzzing in his ears with heightened hearing sensitivity particularly to mechanical sounds. He also reports visual sensitivity to light. He also complains of stomach soreness without vomiting. His by mouth intake is decreased but is improving as of late including gaining 5-6 pounds. His also having some hoarseness in his voice since starting chemotherapy. He does not report any headaches, blurred vision, syncope or seizures. His report any chest pain, shortness of breath, palpitation, orthopnea, PND. He does not report any cough or wheezing. He has not reported any nausea, vomiting,  diarrhea or change in his bowel habits. He does not report any arthralgias or  myalgias. He has not observed any lymphadenopathy or petechiae. He does report chronic back pain and paresthesias in his left leg after his recent operation. Remainder of his review of systems unremarkable.    Medications: I have reviewed the patient's current medications.  Current Outpatient Prescriptions  Medication Sig Dispense Refill  . oxyCODONE-acetaminophen (PERCOCET) 5-325 MG per tablet Take 1 tablet by mouth every 4 (four) hours as needed for severe pain. 30 tablet 0  . ondansetron (ZOFRAN) 8 MG tablet Take 1 tablet (8 mg total) by mouth every 8 (eight) hours as needed for nausea or vomiting. (Patient not taking: Reported on 07/29/2014) 20 tablet 0  . phenazopyridine (PYRIDIUM) 200 MG tablet Take 1 tablet (200 mg total) by mouth 3 (three) times daily as needed for pain. (Patient not taking: Reported on 07/29/2014) 10 tablet 0  . prochlorperazine (COMPAZINE) 10 MG tablet Take 1 tablet (10 mg total) by mouth every 6 (six) hours as needed for nausea or vomiting. (Patient not taking: Reported on 07/29/2014) 30 tablet 0   No current facility-administered medications for this visit.     Allergies: No Known Allergies  Past Medical History, Surgical history, Social history, and Family History were reviewed and updated.   Physical Exam: Blood pressure 148/69, pulse 65, temperature 98.2 F (36.8 C), temperature source Oral, resp. rate 18, height 6' (1.829 m), weight 207 lb 14.4 oz (94.303 kg).   ECOG: 0  General appearance: alert and cooperative Head: Normocephalic, without obvious abnormality Neck: no adenopathy Lymph nodes: Cervical, supraclavicular, and axillary nodes normal. Heart:regular rate and rhythm, S1, S2 normal, no murmur, click, rub or gallop Lung:chest clear, no wheezing, rales, normal symmetric air entry Abdomin: soft, non-tender, without masses or organomegaly EXT:no erythema, induration, or nodules Neurological  examination: No deficits.  Lab Results: Lab Results   Component Value Date   WBC 5.2 07/29/2014   HGB 11.4* 07/29/2014   HCT 32.9* 07/29/2014   MCV 84.5 07/29/2014   PLT 258 07/29/2014     Chemistry      Component Value Date/Time   NA 134* 07/15/2014 0911   NA 138 07/03/2012 1606   K 4.1 07/15/2014 0911   K 4.2 07/03/2012 1606   CL 102 07/03/2012 1606   CO2 23 07/15/2014 0911   CO2 29 07/03/2012 1606   BUN 37.6* 07/15/2014 0911   BUN 21 07/03/2012 1606   CREATININE 1.7* 07/15/2014 0911   CREATININE 0.95 07/03/2012 1606      Component Value Date/Time   CALCIUM 9.5 07/15/2014 0911   CALCIUM 9.1 07/03/2012 1606   ALKPHOS 99 07/15/2014 0911   ALKPHOS 86 07/03/2012 1606   AST 74* 07/15/2014 0911   AST 35 07/03/2012 1606   ALT 229* 07/15/2014 0911   ALT 45 07/03/2012 1606   BILITOT 0.58 07/15/2014 0911   BILITOT 0.4 07/03/2012 1606       Impression and Plan:  63 year old man with the following issues:  1. High-grade, muscle invasive transitional cell carcinoma of the bladder diagnosed in September 2015 after presenting with symptomatic hematuria. His clinical stage is T2 N0 with disease into the superficial muscle of the bladder wall. He is status post 2 cycles of neoadjuvant chemotherapy. He tolerated chemotherapy poorly with a creatinine also pumping to 1.7 after the second cycle. Given his symptoms and laboratory data, I will suspend chemotherapy completely at this time. I will repeat imaging studies to ensure he does not have metastatic disease.if he doesn't, he will proceed with cystectomy as planned on 09/07/2014.  2. IV access: He continues to his peripheral veins.  3. Antiemetics: Continue Compazine in addition to Zofran.   4. Followup: Will be 08/12/2014.  [facility], MD 11/27/20159:30 AM

## 2014-07-29 NOTE — Telephone Encounter (Signed)
LM to confirm appt d/t for Dec. Mailed cal.

## 2014-08-05 ENCOUNTER — Other Ambulatory Visit: Payer: Self-pay | Admitting: Oncology

## 2014-08-05 ENCOUNTER — Other Ambulatory Visit (HOSPITAL_BASED_OUTPATIENT_CLINIC_OR_DEPARTMENT_OTHER): Payer: BC Managed Care – PPO

## 2014-08-05 ENCOUNTER — Ambulatory Visit: Payer: BC Managed Care – PPO | Admitting: Physician Assistant

## 2014-08-05 ENCOUNTER — Encounter (HOSPITAL_COMMUNITY): Payer: Self-pay

## 2014-08-05 ENCOUNTER — Telehealth: Payer: Self-pay | Admitting: Medical Oncology

## 2014-08-05 ENCOUNTER — Ambulatory Visit: Payer: BC Managed Care – PPO

## 2014-08-05 ENCOUNTER — Other Ambulatory Visit: Payer: BC Managed Care – PPO

## 2014-08-05 ENCOUNTER — Ambulatory Visit (HOSPITAL_COMMUNITY)
Admission: RE | Admit: 2014-08-05 | Discharge: 2014-08-05 | Disposition: A | Discharge status: Home / Self Care | Payer: BC Managed Care – PPO | Source: Ambulatory Visit | Attending: Oncology | Admitting: Oncology

## 2014-08-05 DIAGNOSIS — R3915 Urgency of urination: Secondary | ICD-10-CM | POA: Insufficient documentation

## 2014-08-05 DIAGNOSIS — C670 Malignant neoplasm of trigone of bladder: Secondary | ICD-10-CM

## 2014-08-05 DIAGNOSIS — C679 Malignant neoplasm of bladder, unspecified: Secondary | ICD-10-CM

## 2014-08-05 DIAGNOSIS — Z8551 Personal history of malignant neoplasm of bladder: Secondary | ICD-10-CM | POA: Insufficient documentation

## 2014-08-05 LAB — CBC WITH DIFFERENTIAL/PLATELET
BASO%: 0.6 % (ref 0.0–2.0)
Basophils Absolute: 0 10*3/uL (ref 0.0–0.1)
EOS%: 1.1 % (ref 0.0–7.0)
Eosinophils Absolute: 0.1 10*3/uL (ref 0.0–0.5)
HCT: 33.1 % — ABNORMAL LOW (ref 38.4–49.9)
HGB: 11.8 g/dL — ABNORMAL LOW (ref 13.0–17.1)
LYMPH%: 30.9 % (ref 14.0–49.0)
MCH: 29.2 pg (ref 27.2–33.4)
MCHC: 35.6 g/dL (ref 32.0–36.0)
MCV: 81.9 fL (ref 79.3–98.0)
MONO#: 1 10*3/uL — ABNORMAL HIGH (ref 0.1–0.9)
MONO%: 15.4 % — ABNORMAL HIGH (ref 0.0–14.0)
NEUT#: 3.2 10*3/uL (ref 1.5–6.5)
NEUT%: 52 % (ref 39.0–75.0)
Platelets: 305 10*3/uL (ref 140–400)
RBC: 4.04 10*6/uL — ABNORMAL LOW (ref 4.20–5.82)
RDW: 13.7 % (ref 11.0–14.6)
WBC: 6.2 10*3/uL (ref 4.0–10.3)
lymph#: 1.9 10*3/uL (ref 0.9–3.3)

## 2014-08-05 LAB — COMPREHENSIVE METABOLIC PANEL (CC13)
ALT: 73 U/L — ABNORMAL HIGH (ref 0–55)
AST: 45 U/L — ABNORMAL HIGH (ref 5–34)
Albumin: 3.9 g/dL (ref 3.5–5.0)
Alkaline Phosphatase: 98 U/L (ref 40–150)
Anion Gap: 10 mEq/L (ref 3–11)
BUN: 18.2 mg/dL (ref 7.0–26.0)
CO2: 26 mEq/L (ref 22–29)
Calcium: 9.5 mg/dL (ref 8.4–10.4)
Chloride: 102 mEq/L (ref 98–109)
Creatinine: 1.1 mg/dL (ref 0.7–1.3)
EGFR: 68 mL/min/{1.73_m2} — ABNORMAL LOW (ref >90)
Glucose: 97 mg/dl (ref 70–140)
Potassium: 4.3 mEq/L (ref 3.5–5.1)
Sodium: 139 mEq/L (ref 136–145)
Total Bilirubin: 0.47 mg/dL (ref 0.20–1.20)
Total Protein: 6.5 g/dL (ref 6.4–8.3)

## 2014-08-05 MED ORDER — IOHEXOL 300 MG/ML  SOLN
125.0000 mL | Freq: Once | INTRAMUSCULAR | Status: AC | PRN
  Administered 2014-08-05: 125 mL via INTRAVENOUS

## 2014-08-05 NOTE — Telephone Encounter (Signed)
Can chest CT be done without contrast ? Per Dr Alen Blew it can be done without contrast and [place] notified.

## 2014-08-08 ENCOUNTER — Inpatient Hospital Stay: Admit: 2014-08-08 | Payer: Self-pay | Admitting: Urology

## 2014-08-08 SURGERY — ROBOTIC ASSISTED LAPAROSCOPIC RADICAL PROSTATECTOMY LEVEL 2
Anesthesia: General

## 2014-08-10 ENCOUNTER — Other Ambulatory Visit: Payer: BC Managed Care – PPO

## 2014-08-12 ENCOUNTER — Ambulatory Visit (HOSPITAL_BASED_OUTPATIENT_CLINIC_OR_DEPARTMENT_OTHER): Payer: BC Managed Care – PPO | Admitting: Oncology

## 2014-08-12 ENCOUNTER — Telehealth: Payer: Self-pay | Admitting: Oncology

## 2014-08-12 VITALS — BP 137/65 | HR 71 | Temp 98.4°F | Resp 19 | Ht 72.0 in | Wt 208.7 lb

## 2014-08-12 DIAGNOSIS — C670 Malignant neoplasm of trigone of bladder: Secondary | ICD-10-CM

## 2014-08-12 DIAGNOSIS — N289 Disorder of kidney and ureter, unspecified: Secondary | ICD-10-CM

## 2014-08-12 DIAGNOSIS — C679 Malignant neoplasm of bladder, unspecified: Secondary | ICD-10-CM

## 2014-08-12 NOTE — Progress Notes (Signed)
Hematology and Oncology Follow Up Visit  Danny Wood [id number] May 14, 1951 63 y.o. 08/12/2014 4:15 PM SWAYNE,DAVID W, MDSwayne, [name], MD   Principle Diagnosis: 63 year old gentleman high-grade, muscle invasive transitional cell carcinoma of the bladder diagnosed in September 2015 after presenting with symptomatic hematuria. His clinical stage is T2 N0.    Prior Therapy: He underwent a cystoscopy on 05/17/2014 which showed extensive bladder wall thickening with carcinoma in situ and possible involvement of the prostatic urethra. Biopsy of the bladder trigone showed a high-grade invasive urothelial carcinoma involving superficial muscle layer extending into the deep edge. Neoadjuvant systemic chemotherapy in the form of gemcitabine and cisplatin cycle 1 day 1 is on 06/17/2014. He is S/P two cycles completed in 07/2014  Current therapy:  Under evaluation for a radical cystectomy.  Interim History: Danny Wood presents today for a followup visit. Since the last visit, he improved dramatically since the conclusion of chemotherapy. He is no longer reporting any nausea, vomiting and fatigue or tiredness. He is no longer reporting any neurological symptoms. He is not reporting any peripheral neuropathy or tinnitus. He does not report any headaches, blurred vision, syncope or seizures. His report any chest pain, shortness of breath, palpitation, orthopnea, PND. He does not report any cough or wheezing. He has not reported any nausea, vomiting,  diarrhea or change in his bowel habits. He does not report any arthralgias or myalgias. He has not observed any lymphadenopathy or petechiae. He does report chronic back pain and paresthesias in his left leg after his recent operation. Remainder of his review of systems unremarkable.    Medications: I have reviewed the patient's current medications.  Current Outpatient Prescriptions  Medication Sig Dispense Refill  . lidocaine-prilocaine (EMLA)  cream   0  . oxyCODONE (OXY IR/ROXICODONE) 5 MG immediate release tablet   0  . oxyCODONE-acetaminophen (PERCOCET) 5-325 MG per tablet Take 1 tablet by mouth every 4 (four) hours as needed for severe pain. 30 tablet 0  . phenazopyridine (PYRIDIUM) 200 MG tablet Take 1 tablet (200 mg total) by mouth 3 (three) times daily as needed for pain. 10 tablet 0  . ondansetron (ZOFRAN) 8 MG tablet Take 1 tablet (8 mg total) by mouth every 8 (eight) hours as needed for nausea or vomiting. (Patient not taking: Reported on 07/29/2014) 20 tablet 0  . prochlorperazine (COMPAZINE) 10 MG tablet Take 1 tablet (10 mg total) by mouth every 6 (six) hours as needed for nausea or vomiting. (Patient not taking: Reported on 07/29/2014) 30 tablet 0   No current facility-administered medications for this visit.     Allergies: No Known Allergies  Past Medical History, Surgical history, Social history, and Family History were reviewed and updated.   Physical Exam: Blood pressure 137/65, pulse 71, temperature 98.4 F (36.9 C), temperature source Oral, resp. rate 19, height 6' (1.829 m), weight 208 lb 11.2 oz (94.666 kg), SpO2 99 %.   ECOG: 0  General appearance: alert and cooperative Head: Normocephalic, without obvious abnormality Neck: no adenopathy Lymph nodes: Cervical, supraclavicular, and axillary nodes normal. Heart:regular rate and rhythm, S1, S2 normal, no murmur, click, rub or gallop Lung:chest clear, no wheezing, rales, normal symmetric air entry Abdomin: soft, non-tender, without masses or organomegaly EXT:no erythema, induration, or nodules Neurological examination: No deficits.  Lab Results: Lab Results  Component Value Date   WBC 6.2 08/05/2014   HGB 11.8* 08/05/2014   HCT 33.1* 08/05/2014   MCV 81.9 08/05/2014   PLT 305 08/05/2014  Chemistry      Component Value Date/Time   NA 139 08/05/2014 1335   NA 138 07/03/2012 1606   K 4.3 08/05/2014 1335   K 4.2 07/03/2012 1606   CL 102  07/03/2012 1606   CO2 26 08/05/2014 1335   CO2 29 07/03/2012 1606   BUN 18.2 08/05/2014 1335   BUN 21 07/03/2012 1606   CREATININE 1.1 08/05/2014 1335   CREATININE 0.95 07/03/2012 1606      Component Value Date/Time   CALCIUM 9.5 08/05/2014 1335   CALCIUM 9.1 07/03/2012 1606   ALKPHOS 98 08/05/2014 1335   ALKPHOS 86 07/03/2012 1606   AST 45* 08/05/2014 1335   AST 35 07/03/2012 1606   ALT 73* 08/05/2014 1335   ALT 45 07/03/2012 1606   BILITOT 0.47 08/05/2014 1335   BILITOT 0.4 07/03/2012 1606     CLINICAL DATA: Subsequent evaluation of bladder cancer, current history of bladder cancer with ongoing chemotherapy, urgency of urination  EXAM: CT ABDOMEN AND PELVIS WITHOUT AND WITH CONTRAST  TECHNIQUE: Multidetector CT imaging of the abdomen and pelvis was performed following the standard protocol before and following the bolus administration of intravenous contrast.  CONTRAST: 170mL OMNIPAQUE IOHEXOL 300 MG/ML SOLN  COMPARISON: 04/18/2014  FINDINGS: Visualized portions of the lung bases are clear.  Liver, spleen, pancreas, adrenal glands normal. 3 cm left renal cyst stable. 5 mm upper pole left renal cyst stable. Postsurgical change right kidney involving the antral lateral aspect of the upper pole with evidence of prior wedge resection. Surgical clips in right renal fossa noted. These findings are stable.  Gallbladder is normal. Stomach, small bowel, appendix, and large bowel are normal.  Mild diffuse bladder wall thickening and irregularity. The degree of thickening and irregularity is likely stable to mildly improved allowing for different degree of distention. A tiny focus of enhancement involving the posterior central bladder wall seen best on image number 84 is identified and is stable.  No acute musculoskeletal findings.  IMPRESSION: Bladder wall thickening stable to mildly improved when compared to prior study.    Impression and  Plan:  63 year old man with the following issues:  1. High-grade, muscle invasive transitional cell carcinoma of the bladder diagnosed in September 2015 after presenting with symptomatic hematuria. His clinical stage is T2 N0 with disease into the superficial muscle of the bladder wall. He is status post 2 cycles of neoadjuvant chemotherapy. He tolerated chemotherapy poorly with a creatinine also pumping to 1.7 after the second cycle. His laboratory data and CT scan were reviewed. I see no evidence to suggest disease outside of the bladder and he should be cleared to proceed with a radical cystectomy which is scheduled for January 2016. I will schedule him a follow-up a month after his operation to discuss his pathology.  2. Renal insufficiency: His creatinine has normalized upon stopping cisplatin.  3. Antiemetics: Nausea has resolved.  4. Followup: Will be 10/2014  [facility] MD 12/11/20154:15 PM

## 2014-08-12 NOTE — Telephone Encounter (Signed)
Gave avs & cal for Feb. °

## 2014-09-08 ENCOUNTER — Encounter (HOSPITAL_COMMUNITY): Payer: Self-pay

## 2014-09-08 ENCOUNTER — Inpatient Hospital Stay (HOSPITAL_COMMUNITY)
Admission: RE | Admit: 2014-09-08 | Discharge: 2014-09-15 | DRG: 654 | Disposition: A | Discharge status: Home Health | Payer: BLUE CROSS/BLUE SHIELD | Source: Ambulatory Visit | Attending: Urology | Admitting: Urology

## 2014-09-08 DIAGNOSIS — R310 Gross hematuria: Secondary | ICD-10-CM | POA: Diagnosis present

## 2014-09-08 DIAGNOSIS — C679 Malignant neoplasm of bladder, unspecified: Secondary | ICD-10-CM | POA: Diagnosis present

## 2014-09-08 DIAGNOSIS — Z87891 Personal history of nicotine dependence: Secondary | ICD-10-CM | POA: Diagnosis not present

## 2014-09-08 DIAGNOSIS — Z905 Acquired absence of kidney: Secondary | ICD-10-CM | POA: Diagnosis present

## 2014-09-08 DIAGNOSIS — Z79899 Other long term (current) drug therapy: Secondary | ICD-10-CM

## 2014-09-08 DIAGNOSIS — K913 Postprocedural intestinal obstruction: Secondary | ICD-10-CM | POA: Diagnosis not present

## 2014-09-08 DIAGNOSIS — G8929 Other chronic pain: Secondary | ICD-10-CM | POA: Diagnosis present

## 2014-09-08 DIAGNOSIS — K4020 Bilateral inguinal hernia, without obstruction or gangrene, not specified as recurrent: Secondary | ICD-10-CM | POA: Diagnosis present

## 2014-09-08 DIAGNOSIS — Z85528 Personal history of other malignant neoplasm of kidney: Secondary | ICD-10-CM

## 2014-09-08 DIAGNOSIS — M549 Dorsalgia, unspecified: Secondary | ICD-10-CM | POA: Diagnosis present

## 2014-09-08 DIAGNOSIS — N281 Cyst of kidney, acquired: Secondary | ICD-10-CM | POA: Diagnosis present

## 2014-09-08 DIAGNOSIS — C670 Malignant neoplasm of trigone of bladder: Secondary | ICD-10-CM | POA: Diagnosis present

## 2014-09-08 HISTORY — DX: Malignant (primary) neoplasm, unspecified: C80.1

## 2014-09-08 LAB — ABO/RH: ABO/RH(D): O POS

## 2014-09-08 LAB — COMPREHENSIVE METABOLIC PANEL WITH GFR
ALT: 56 U/L — ABNORMAL HIGH (ref 0–53)
AST: 38 U/L — ABNORMAL HIGH (ref 0–37)
Albumin: 4 g/dL (ref 3.5–5.2)
Alkaline Phosphatase: 83 U/L (ref 39–117)
Anion gap: 5 (ref 5–15)
BUN: 16 mg/dL (ref 6–23)
CO2: 28 mmol/L (ref 19–32)
Calcium: 9 mg/dL (ref 8.4–10.5)
Chloride: 105 meq/L (ref 96–112)
Creatinine, Ser: 0.99 mg/dL (ref 0.50–1.35)
GFR calc Af Amer: >90 mL/min
GFR calc non Af Amer: 85 mL/min — ABNORMAL LOW
Glucose, Bld: 114 mg/dL — ABNORMAL HIGH (ref 70–99)
Potassium: 4.4 mmol/L (ref 3.5–5.1)
Sodium: 138 mmol/L (ref 135–145)
Total Bilirubin: 0.8 mg/dL (ref 0.3–1.2)
Total Protein: 6.9 g/dL (ref 6.0–8.3)

## 2014-09-08 LAB — CBC
HCT: 36.9 % — ABNORMAL LOW (ref 39.0–52.0)
Hemoglobin: 12.8 g/dL — ABNORMAL LOW (ref 13.0–17.0)
MCH: 30.5 pg (ref 26.0–34.0)
MCHC: 34.7 g/dL (ref 30.0–36.0)
MCV: 87.9 fL (ref 78.0–100.0)
Platelets: 174 K/uL (ref 150–400)
RBC: 4.2 MIL/uL — ABNORMAL LOW (ref 4.22–5.81)
RDW: 14.1 % (ref 11.5–15.5)
WBC: 6.4 K/uL (ref 4.0–10.5)

## 2014-09-08 LAB — PREPARE RBC (CROSSMATCH)

## 2014-09-08 MED ORDER — KCL IN DEXTROSE-NACL 20-5-0.45 MEQ/L-%-% IV SOLN
INTRAVENOUS | Status: DC
  Administered 2014-09-08: 16:00:00 via INTRAVENOUS
  Filled 2014-09-08 (×2): qty 1000

## 2014-09-08 MED ORDER — PEG 3350-KCL-NA BICARB-NACL 420 G PO SOLR
4000.0000 mL | Freq: Once | ORAL | Status: AC
  Administered 2014-09-08: 4000 mL via ORAL

## 2014-09-08 MED ORDER — SODIUM CHLORIDE 0.9 % IV SOLN: Freq: Once | INTRAVENOUS | Status: DC

## 2014-09-08 MED ORDER — PIPERACILLIN-TAZOBACTAM 3.375 G IVPB 30 MIN
3.3750 g | INTRAVENOUS | Status: AC
  Administered 2014-09-09: 3.375 g via INTRAVENOUS
  Filled 2014-09-08: qty 50

## 2014-09-08 NOTE — H&P (Signed)
Danny Wood is an 64 y.o. male.    Chief Complaint: Pre-OP Cystectomy  HPI:   1 - Muscle Invasive Bladder Cancer - T2G3 bladder cancer by TURBT 05/2014 by Dr Jeffie Pollock on eval of bladder thickening and gross hematuria. Upper tract eval negative. CT w/o advanced or distant disease. Also some CIS at bladder neck but urethral biopsies benign. He has been extensively couselled by Dr. Jeffie Pollock, myself, and Dr. Alen Blew and now s/p neo-adjuvant chemo with plan for cystectomy with ileal conduit tomorrow.   2 - Left Simple Renal Cyst - 3cm left simple cyst by CT 05/2014.  3 - Renal Cell Carcinoma - s/p open right partial 2001. No recurrence with most recent surveillance.  4 -  Bilateral Inguinal Hernias - incidental fat-containing hernias. NO h/o strangulation.  PMH sig for Rt open partial nephrectomy. No CV disease. No strong blood thinners. His PCP is [name] MD.   Today " Danny Wood " is seen as preadmission for labs and bowel prep before cystectomy tomorrow.  Past Medical History  Diagnosis Date  . History of renal cell carcinoma     2003  S/P  PARTIAL RIGHT NEPHRECTOMY  . Hematuria   . Bladder disorder   . Urgency of urination   . Presence of surgical incision     lumbar diskectomy 05-11-2014-  bandage present  . At risk for sleep apnea     STOP-BANG= 4       SENT TO PCP 05-16-2014    Past Surgical History  Procedure Laterality Date  . Evaluation under anesthesia with hemorrhoidectomy  07/07/2012    Procedure: EXAM UNDER ANESTHESIA WITH HEMORRHOIDECTOMY;  Surgeon: Joyice Faster. Cornett, MD;  Location: [facility];  Service: General;  Laterality: N/A;  . Partial nephrectomy Right 2003  . Lumbar microdiscectomy  05-11-2014    L4 -- L5 (partial)  . Cystoscopy with biopsy N/A 05/17/2014    Procedure: CYSTO WITH BLADDER BIOPSY;  Surgeon: Malka So, MD;  Location: [facility];  Service: Urology;  Laterality: N/A;    No family history on file. Social History:  reports that he  quit smoking about 10 years ago. His smoking use included Cigarettes. He has a 12.5 pack-year smoking history. He has never used smokeless tobacco. He reports that he drinks about 1.0 oz of alcohol per week. He reports that he does not use illicit drugs.  Allergies: No Known Allergies  Medications Prior to Admission  Medication Sig Dispense Refill  . lidocaine-prilocaine (EMLA) cream   0  . ondansetron (ZOFRAN) 8 MG tablet Take 1 tablet (8 mg total) by mouth every 8 (eight) hours as needed for nausea or vomiting. (Patient not taking: Reported on 07/29/2014) 20 tablet 0  . oxyCODONE (OXY IR/ROXICODONE) 5 MG immediate release tablet   0  . oxyCODONE-acetaminophen (PERCOCET) 5-325 MG per tablet Take 1 tablet by mouth every 4 (four) hours as needed for severe pain. 30 tablet 0  . phenazopyridine (PYRIDIUM) 200 MG tablet Take 1 tablet (200 mg total) by mouth 3 (three) times daily as needed for pain. 10 tablet 0  . prochlorperazine (COMPAZINE) 10 MG tablet Take 1 tablet (10 mg total) by mouth every 6 (six) hours as needed for nausea or vomiting. (Patient not taking: Reported on 07/29/2014) 30 tablet 0    No results found for this or any previous visit (from the past 48 hour(s)). No results found.  Review of Systems  Constitutional: Negative.   HENT: Negative.   Eyes: Negative.  Respiratory: Negative.   Cardiovascular: Negative.   Gastrointestinal: Negative.   Genitourinary: Negative.   Musculoskeletal: Negative.   Skin: Negative.   Neurological: Negative.   Endo/Heme/Allergies: Negative.   Psychiatric/Behavioral: Negative.     There were no vitals taken for this visit. Physical Exam  Constitutional: He appears well-developed.  HENT:  Head: Normocephalic.  Eyes: Pupils are equal, round, and reactive to light.  Neck: Normal range of motion. Neck supple.  Cardiovascular: Normal rate.   Respiratory: Effort normal.  GI: Soft.  Genitourinary: Penis normal.  No CVAT  Musculoskeletal:  Normal range of motion.  Neurological: He is alert.  Skin: Skin is warm.  Psychiatric: He has a normal mood and affect. His behavior is normal. Judgment and thought content normal.     Assessment/Plan   1 - Muscle Invasive Bladder Cancer - now s/p neoadjuvant chemo.  We rediscussed the role of radical cystectomy + lymph node dissection with concomitant prostatectomy in male and hysterectomy / oophorectomy in male and ileal conduit urinary diversion with the overall goal of complete surgical excision (negative margins) and better staging / diagnosis. We specifically rediscussed alternatives including chemo-radiation, palliative therapies, and the role of neoadjuvant chemotherapy. We then rediscussed surgical approaches including robotic and open techniques with robotic associated with a shorter convalescence. I showed the patient on their abdomen the approximately 4-6 incision (trocar) sites as well as presumed extraction sites with robotic approach as well as possible open incision sites. I also showed them potential sites for the ileal conduit and spent significant time explaining the "plumbing" of this with regards to GI and GU tracts and specific risks of diversion including ureteral stricture. We specifically readdressed that there may be need to alter operative plans according to intraopertive findings including conversion to open procedure. We rediscussed specific peri-operative risks including bleeding, infection, deep vein thrombosis, pulmonary embolism, compartment syndrome, nuropathy / neuropraxia, bowel leak, bowel stricture, heart attack, stroke, death, as well as long-term risks such as non-cure / need for additional therapy and need for imaging and lab based post-op surveillance protocols. We rediscussed typical hospital course of approximately 5-7 day hospitalization, need for peri-operative drains / catheters, and typical post-hospital course with return to most non-strenuous activities  by 4 weeks and ability to return to most jobs and more strenuous activity such as exercise by 8 weeks but with complete return to baseline often taking 33mos plus.  After this lengthy and detail discussion, including answering all of the patient's questions to their satisfaction, they have chosen to proceed with robotic cystectomy with ileal conduit tomorrow as planned.  2 - Left Simple Renal Cyst - no indication for further eval, observe. THis will be inherently monitored with future cancer surveillance.   3 - Renal Cell Carcinoma - NED by imaging.   4 - Prostate Screening - Up to date this year.  5 - Bilateral Inguinal Hernias - would likely repair at time of cystectomy to prevent herniation should he proceed.  CMP, CBC, CXR, EKG, Consent, Bowel Prep, Stomal Marking NPO p MN  Danny Wood 09/08/2014, 1:33 PM

## 2014-09-08 NOTE — Consult Note (Signed)
WOC ostomy consult note Preoperative stoma site selection per Dr. Zettie Pho request.  Patient for OR tomorrow and creation of an ileal conduit.  Abdomen viewed in the sitting and standing positions.  Site selected is below the belt line in the RLQ and within the rectus muscle. Site is marked with a surgical skin marking pen and covered with a thin film dressing.  Site is 6cm to the right of the umbilicus and 2cm below. Patient is aware that he will have an ostomy and ear a pouch to contain urine. He understands the role of the [facility] nurse in the post operative phase.  He is alone today, but states that his wife, Mechele Claude will be with him tomorrow. [place] nursing team will follow, and will remain available to this patient, the nursing, surgical and medical teams.   Thanks, Maudie Flakes, MSN, RN, [place], [place], [place] [phone])

## 2014-09-09 ENCOUNTER — Inpatient Hospital Stay (HOSPITAL_COMMUNITY): Payer: BLUE CROSS/BLUE SHIELD | Admitting: Certified Registered Nurse Anesthetist

## 2014-09-09 ENCOUNTER — Encounter (HOSPITAL_COMMUNITY)
Admission: RE | Disposition: A | Discharge status: Home Health | Payer: Self-pay | Source: Ambulatory Visit | Attending: Urology

## 2014-09-09 ENCOUNTER — Encounter (HOSPITAL_COMMUNITY): Payer: Self-pay | Admitting: Certified Registered Nurse Anesthetist

## 2014-09-09 HISTORY — PX: CYSTOSCOPY: SHX5120

## 2014-09-09 HISTORY — PX: ROBOT ASSISTED LAPAROSCOPIC COMPLETE CYSTECT ILEAL CONDUIT: SHX5139

## 2014-09-09 LAB — HEMOGLOBIN AND HEMATOCRIT, BLOOD
HCT: 33.2 % — ABNORMAL LOW (ref 39.0–52.0)
Hemoglobin: 11.8 g/dL — ABNORMAL LOW (ref 13.0–17.0)

## 2014-09-09 LAB — MRSA PCR SCREENING: MRSA by PCR: NEGATIVE

## 2014-09-09 SURGERY — ROBOTIC ASSISTED LAPAROSCOPIC COMPLETE CYSTECT ILEAL CONDUIT
Anesthesia: General

## 2014-09-09 MED ORDER — PROPOFOL 10 MG/ML IV BOLUS
INTRAVENOUS | Status: AC
  Filled 2014-09-09: qty 20

## 2014-09-09 MED ORDER — ONDANSETRON HCL 4 MG/2ML IJ SOLN: 4.0000 mg | INTRAMUSCULAR | Status: DC | PRN

## 2014-09-09 MED ORDER — LABETALOL HCL 5 MG/ML IV SOLN
5.0000 mg | INTRAVENOUS | Status: DC | PRN
  Administered 2014-09-09 (×2): 5 mg via INTRAVENOUS

## 2014-09-09 MED ORDER — LABETALOL HCL 5 MG/ML IV SOLN
INTRAVENOUS | Status: AC
  Filled 2014-09-09: qty 4

## 2014-09-09 MED ORDER — PROPOFOL 10 MG/ML IV BOLUS
INTRAVENOUS | Status: DC | PRN
  Administered 2014-09-09: 200 mg via INTRAVENOUS

## 2014-09-09 MED ORDER — FENTANYL CITRATE 0.05 MG/ML IJ SOLN
INTRAMUSCULAR | Status: DC | PRN
  Administered 2014-09-09: 50 ug via INTRAVENOUS
  Administered 2014-09-09: 100 ug via INTRAVENOUS
  Administered 2014-09-09 (×4): 50 ug via INTRAVENOUS
  Administered 2014-09-09: 100 ug via INTRAVENOUS

## 2014-09-09 MED ORDER — LIDOCAINE HCL (CARDIAC) 20 MG/ML IV SOLN
INTRAVENOUS | Status: DC | PRN
  Administered 2014-09-09: 80 mg via INTRAVENOUS

## 2014-09-09 MED ORDER — PROMETHAZINE HCL 25 MG/ML IJ SOLN: 6.2500 mg | INTRAMUSCULAR | Status: DC | PRN

## 2014-09-09 MED ORDER — HYDROMORPHONE HCL 1 MG/ML IJ SOLN
INTRAMUSCULAR | Status: DC | PRN
  Administered 2014-09-09 (×2): 0.5 mg via INTRAVENOUS
  Administered 2014-09-09: 1 mg via INTRAVENOUS
  Administered 2014-09-09 (×2): 0.5 mg via INTRAVENOUS
  Administered 2014-09-09 (×2): 1 mg via INTRAVENOUS

## 2014-09-09 MED ORDER — HYDROMORPHONE HCL 2 MG/ML IJ SOLN
INTRAMUSCULAR | Status: AC
  Filled 2014-09-09: qty 1

## 2014-09-09 MED ORDER — HYDROMORPHONE HCL 1 MG/ML IJ SOLN
INTRAMUSCULAR | Status: AC
  Filled 2014-09-09: qty 1

## 2014-09-09 MED ORDER — ROCURONIUM BROMIDE 100 MG/10ML IV SOLN
INTRAVENOUS | Status: AC
Start: 2014-09-09 — End: 2014-09-09
  Filled 2014-09-09: qty 1

## 2014-09-09 MED ORDER — LACTATED RINGERS IV SOLN
INTRAVENOUS | Status: DC | PRN
  Administered 2014-09-09: 07:00:00 via INTRAVENOUS

## 2014-09-09 MED ORDER — LABETALOL HCL 5 MG/ML IV SOLN
INTRAVENOUS | Status: DC | PRN
  Administered 2014-09-09 (×2): 5 mg via INTRAVENOUS

## 2014-09-09 MED ORDER — DEXAMETHASONE SODIUM PHOSPHATE 10 MG/ML IJ SOLN
INTRAMUSCULAR | Status: DC | PRN
  Administered 2014-09-09: 10 mg via INTRAVENOUS

## 2014-09-09 MED ORDER — BUPIVACAINE 0.25 % ON-Q PUMP SINGLE CATH 300ML
300.0000 mL | INJECTION | Status: DC
  Administered 2014-09-09: 300 mL
  Filled 2014-09-09: qty 300

## 2014-09-09 MED ORDER — BUPIVACAINE HCL (PF) 0.25 % IJ SOLN
INTRAMUSCULAR | Status: DC | PRN
  Administered 2014-09-09: 20 mL

## 2014-09-09 MED ORDER — PIPERACILLIN-TAZOBACTAM 3.375 G IVPB
3.3750 g | Freq: Three times a day (TID) | INTRAVENOUS | Status: AC
  Administered 2014-09-09 – 2014-09-11 (×6): 3.375 g via INTRAVENOUS
  Filled 2014-09-09 (×6): qty 50

## 2014-09-09 MED ORDER — HYDROMORPHONE HCL 1 MG/ML IJ SOLN
0.2500 mg | INTRAMUSCULAR | Status: DC | PRN
  Administered 2014-09-09: 0.5 mg via INTRAVENOUS
  Administered 2014-09-09 (×2): 0.25 mg via INTRAVENOUS

## 2014-09-09 MED ORDER — STERILE WATER FOR IRRIGATION IR SOLN
Status: DC | PRN
  Administered 2014-09-09: 4000 mL

## 2014-09-09 MED ORDER — MIDAZOLAM HCL 2 MG/2ML IJ SOLN
INTRAMUSCULAR | Status: AC
Start: 2014-09-09 — End: 2014-09-09
  Filled 2014-09-09: qty 2

## 2014-09-09 MED ORDER — GLYCOPYRROLATE 0.2 MG/ML IJ SOLN
INTRAMUSCULAR | Status: DC | PRN
  Administered 2014-09-09: 0.6 mg via INTRAVENOUS

## 2014-09-09 MED ORDER — MEPERIDINE HCL 50 MG/ML IJ SOLN: 6.2500 mg | INTRAMUSCULAR | Status: DC | PRN

## 2014-09-09 MED ORDER — PIPERACILLIN-TAZOBACTAM 3.375 G IVPB
INTRAVENOUS | Status: AC
  Filled 2014-09-09: qty 50

## 2014-09-09 MED ORDER — SODIUM CHLORIDE 0.9 % IJ SOLN: 9.0000 mL | INTRAMUSCULAR | Status: DC | PRN

## 2014-09-09 MED ORDER — SENNA 8.6 MG PO TABS
1.0000 | ORAL_TABLET | Freq: Two times a day (BID) | ORAL | Status: DC
  Administered 2014-09-10 – 2014-09-15 (×11): 8.6 mg via ORAL
  Filled 2014-09-09 (×12): qty 1

## 2014-09-09 MED ORDER — NEOSTIGMINE METHYLSULFATE 10 MG/10ML IV SOLN
INTRAVENOUS | Status: DC | PRN
  Administered 2014-09-09: 4 mg via INTRAVENOUS

## 2014-09-09 MED ORDER — LIDOCAINE HCL (CARDIAC) 20 MG/ML IV SOLN
INTRAVENOUS | Status: AC
  Filled 2014-09-09: qty 5

## 2014-09-09 MED ORDER — ONDANSETRON HCL 4 MG/2ML IJ SOLN
4.0000 mg | Freq: Four times a day (QID) | INTRAMUSCULAR | Status: DC | PRN
  Filled 2014-09-09: qty 2

## 2014-09-09 MED ORDER — DOCUSATE SODIUM 100 MG PO CAPS
100.0000 mg | ORAL_CAPSULE | Freq: Two times a day (BID) | ORAL | Status: DC
  Administered 2014-09-10 – 2014-09-15 (×11): 100 mg via ORAL
  Filled 2014-09-09 (×13): qty 1

## 2014-09-09 MED ORDER — LACTATED RINGERS IR SOLN
Status: DC | PRN
  Administered 2014-09-09: 1000 mL

## 2014-09-09 MED ORDER — ROCURONIUM BROMIDE 100 MG/10ML IV SOLN
INTRAVENOUS | Status: AC
  Filled 2014-09-09: qty 1

## 2014-09-09 MED ORDER — LACTATED RINGERS IV SOLN
INTRAVENOUS | Status: DC | PRN
  Administered 2014-09-09 (×3): via INTRAVENOUS

## 2014-09-09 MED ORDER — ACETAMINOPHEN 10 MG/ML IV SOLN
1000.0000 mg | Freq: Three times a day (TID) | INTRAVENOUS | Status: AC
  Administered 2014-09-09 – 2014-09-10 (×3): 1000 mg via INTRAVENOUS
  Filled 2014-09-09 (×4): qty 100

## 2014-09-09 MED ORDER — NEOSTIGMINE METHYLSULFATE 10 MG/10ML IV SOLN
INTRAVENOUS | Status: AC
  Filled 2014-09-09: qty 1

## 2014-09-09 MED ORDER — ROCURONIUM BROMIDE 100 MG/10ML IV SOLN
INTRAVENOUS | Status: DC | PRN
  Administered 2014-09-09: 10 mg via INTRAVENOUS
  Administered 2014-09-09: 50 mg via INTRAVENOUS
  Administered 2014-09-09: 20 mg via INTRAVENOUS
  Administered 2014-09-09: 10 mg via INTRAVENOUS
  Administered 2014-09-09: 20 mg via INTRAVENOUS
  Administered 2014-09-09: 10 mg via INTRAVENOUS
  Administered 2014-09-09: 20 mg via INTRAVENOUS

## 2014-09-09 MED ORDER — SODIUM CHLORIDE 0.9 % IR SOLN
Status: DC | PRN
  Administered 2014-09-09: 2000 mL

## 2014-09-09 MED ORDER — GLYCOPYRROLATE 0.2 MG/ML IJ SOLN
INTRAMUSCULAR | Status: AC
  Filled 2014-09-09: qty 3

## 2014-09-09 MED ORDER — BUPIVACAINE HCL (PF) 0.25 % IJ SOLN
INTRAMUSCULAR | Status: AC
  Filled 2014-09-09: qty 30

## 2014-09-09 MED ORDER — DIPHENHYDRAMINE HCL 12.5 MG/5ML PO ELIX
12.5000 mg | ORAL_SOLUTION | Freq: Four times a day (QID) | ORAL | Status: DC | PRN
  Filled 2014-09-09: qty 5

## 2014-09-09 MED ORDER — HYDROMORPHONE 0.3 MG/ML IV SOLN
INTRAVENOUS | Status: DC
  Administered 2014-09-09: 17:00:00 via INTRAVENOUS
  Administered 2014-09-10: 2.1 mg via INTRAVENOUS
  Administered 2014-09-10: 3.3 mg via INTRAVENOUS
  Administered 2014-09-10: 2.7 mg via INTRAVENOUS
  Administered 2014-09-10: 4.8 mg via INTRAVENOUS
  Administered 2014-09-10: 13:00:00 via INTRAVENOUS
  Administered 2014-09-10: 1.3 mg via INTRAVENOUS
  Administered 2014-09-11: 2.7 mg via INTRAVENOUS
  Administered 2014-09-11: 2.1 mg via INTRAVENOUS
  Administered 2014-09-11: 21:00:00 via INTRAVENOUS
  Administered 2014-09-11: 1.2 mg via INTRAVENOUS
  Administered 2014-09-11: 1.8 mg via INTRAVENOUS
  Administered 2014-09-11: 0.822 mg via INTRAVENOUS
  Administered 2014-09-12: 1.2 mg via INTRAVENOUS
  Administered 2014-09-12: 1.8 mg via INTRAVENOUS
  Administered 2014-09-12: 0.3 mg via INTRAVENOUS
  Administered 2014-09-12 (×2): 1.5 mg via INTRAVENOUS
  Administered 2014-09-12: 15:00:00 via INTRAVENOUS
  Administered 2014-09-12: 1.2 mg via INTRAVENOUS
  Administered 2014-09-13: 0.6 mg via INTRAVENOUS
  Administered 2014-09-13: 0.3 mg via INTRAVENOUS
  Administered 2014-09-13: 0 mg via INTRAVENOUS
  Administered 2014-09-13 (×2): 0.9 mg via INTRAVENOUS
  Administered 2014-09-13 – 2014-09-14 (×3): 0.3 mg via INTRAVENOUS
  Administered 2014-09-14: 0.6 mg via INTRAVENOUS
  Administered 2014-09-14: 0.239 mg via INTRAVENOUS
  Administered 2014-09-14: 0.9 mg via INTRAVENOUS
  Filled 2014-09-09 (×6): qty 25

## 2014-09-09 MED ORDER — DIPHENHYDRAMINE HCL 50 MG/ML IJ SOLN: 12.5000 mg | Freq: Four times a day (QID) | INTRAMUSCULAR | Status: DC | PRN

## 2014-09-09 MED ORDER — LACTATED RINGERS IV SOLN: INTRAVENOUS | Status: DC

## 2014-09-09 MED ORDER — ONDANSETRON HCL 4 MG/2ML IJ SOLN
INTRAMUSCULAR | Status: DC | PRN
  Administered 2014-09-09: 4 mg via INTRAVENOUS

## 2014-09-09 MED ORDER — SODIUM CHLORIDE 0.9 % IJ SOLN
INTRAMUSCULAR | Status: AC
  Filled 2014-09-09: qty 30

## 2014-09-09 MED ORDER — NALOXONE HCL 0.4 MG/ML IJ SOLN: 0.4000 mg | INTRAMUSCULAR | Status: DC | PRN

## 2014-09-09 MED ORDER — ONDANSETRON HCL 4 MG/2ML IJ SOLN
INTRAMUSCULAR | Status: AC
  Filled 2014-09-09: qty 2

## 2014-09-09 MED ORDER — INDIGOTINDISULFONATE SODIUM 8 MG/ML IJ SOLN
INTRAMUSCULAR | Status: DC | PRN
  Administered 2014-09-09: 3 mL

## 2014-09-09 MED ORDER — MIDAZOLAM HCL 5 MG/5ML IJ SOLN
INTRAMUSCULAR | Status: DC | PRN
  Administered 2014-09-09: 2 mg via INTRAVENOUS

## 2014-09-09 MED ORDER — SUCCINYLCHOLINE CHLORIDE 20 MG/ML IJ SOLN
INTRAMUSCULAR | Status: DC | PRN
  Administered 2014-09-09: 100 mg via INTRAVENOUS

## 2014-09-09 MED ORDER — FENTANYL CITRATE 0.05 MG/ML IJ SOLN
INTRAMUSCULAR | Status: AC
  Filled 2014-09-09: qty 2

## 2014-09-09 MED ORDER — HYDROMORPHONE 0.3 MG/ML IV SOLN
INTRAVENOUS | Status: AC
  Administered 2014-09-10: 2.7 mg via INTRAVENOUS
  Filled 2014-09-09: qty 25

## 2014-09-09 MED ORDER — FENTANYL CITRATE 0.05 MG/ML IJ SOLN
INTRAMUSCULAR | Status: AC
  Filled 2014-09-09: qty 5

## 2014-09-09 MED ORDER — CETYLPYRIDINIUM CHLORIDE 0.05 % MT LIQD
7.0000 mL | Freq: Two times a day (BID) | OROMUCOSAL | Status: DC
  Administered 2014-09-09 – 2014-09-15 (×10): 7 mL via OROMUCOSAL

## 2014-09-09 MED ORDER — KCL IN DEXTROSE-NACL 20-5-0.45 MEQ/L-%-% IV SOLN
INTRAVENOUS | Status: DC
  Administered 2014-09-09 – 2014-09-15 (×9): via INTRAVENOUS
  Filled 2014-09-09 (×13): qty 1000

## 2014-09-09 MED ORDER — ACETAMINOPHEN 500 MG PO TABS
1000.0000 mg | ORAL_TABLET | Freq: Three times a day (TID) | ORAL | Status: AC
  Filled 2014-09-09: qty 2

## 2014-09-09 SURGICAL SUPPLY — 107 items
APL ESCP 34 STRL LF DISP (HEMOSTASIS)
APL SKNCLS STERI-STRIP NONHPOA (GAUZE/BANDAGES/DRESSINGS) ×1
APPLICATOR SURGIFLO ENDO (HEMOSTASIS) IMPLANT
BAG SPEC RTRVL LRG 6X4 10 (ENDOMECHANICALS)
BAG URINE DRAINAGE (UROLOGICAL SUPPLIES) IMPLANT
BAG URO CATCHER STRL LF (DRAPE) ×2 IMPLANT
BALLN NEPHROSTOMY (BALLOONS) ×2
BALLOON NEPHROSTOMY (BALLOONS) ×1 IMPLANT
BENZOIN TINCTURE PRP APPL 2/3 (GAUZE/BANDAGES/DRESSINGS) ×2 IMPLANT
BLADE SURG SZ10 CARB STEEL (BLADE) IMPLANT
CABLE HIGH FREQUENCY MONO STRZ (ELECTRODE) ×2 IMPLANT
CATH FOLEY 2W COUNCIL 5CC 18FR (CATHETERS) ×2 IMPLANT
CATH FOLEY 2WAY SLVR 18FR 30CC (CATHETERS) ×2 IMPLANT
CATH KIT ON-Q SILVERSOAK 7.5IN (CATHETERS) ×2 IMPLANT
CATH ROBINSON RED A/P 16FR (CATHETERS) ×2 IMPLANT
CATH TIEMANN FOLEY 18FR 5CC (CATHETERS) ×2 IMPLANT
CHLORAPREP W/TINT 26ML (MISCELLANEOUS) ×2 IMPLANT
CLIP LIGATING HEM O LOK PURPLE (MISCELLANEOUS) ×10 IMPLANT
CLIP LIGATING HEMO LOK XL GOLD (MISCELLANEOUS) ×4 IMPLANT
CLIP LIGATING HEMO O LOK GREEN (MISCELLANEOUS) ×2 IMPLANT
CLOTH BEACON ORANGE TIMEOUT ST (SAFETY) ×2 IMPLANT
COVER MAYO STAND STRL (DRAPES) ×2 IMPLANT
COVER SURGICAL LIGHT HANDLE (MISCELLANEOUS) ×2 IMPLANT
COVER TIP SHEARS 8 DVNC (MISCELLANEOUS) ×1 IMPLANT
COVER TIP SHEARS 8MM DA VINCI (MISCELLANEOUS) ×1
DECANTER SPIKE VIAL GLASS SM (MISCELLANEOUS) ×2 IMPLANT
DERMABOND ADVANCED (GAUZE/BANDAGES/DRESSINGS) ×1
DERMABOND ADVANCED .7 DNX12 (GAUZE/BANDAGES/DRESSINGS) ×1 IMPLANT
DRAPE ARM DVNC X/XI (DISPOSABLE) IMPLANT
DRAPE CAMERA CLOSED 9X96 (DRAPES) ×2 IMPLANT
DRAPE COLUMN DVNC XI (DISPOSABLE) IMPLANT
DRAPE DA VINCI XI ARM (DISPOSABLE)
DRAPE DA VINCI XI COLUMN (DISPOSABLE)
DRAPE WARM FLUID 44X44 (DRAPE) ×2 IMPLANT
DRSG TEGADERM 4X4.75 (GAUZE/BANDAGES/DRESSINGS) ×2 IMPLANT
DRSG TEGADERM 6X8 (GAUZE/BANDAGES/DRESSINGS) ×4 IMPLANT
ELECT CAUTERY BLADE 6.4 (BLADE) ×2 IMPLANT
ELECT REM PT RETURN 9FT ADLT (ELECTROSURGICAL) ×2
ELECTRODE REM PT RTRN 9FT ADLT (ELECTROSURGICAL) ×1 IMPLANT
GAUZE SPONGE 4X4 12PLY STRL (GAUZE/BANDAGES/DRESSINGS) ×2 IMPLANT
GLOVE BIO SURGEON STRL SZ 6.5 (GLOVE) ×4 IMPLANT
GLOVE BIOGEL M STRL SZ7.5 (GLOVE) ×6 IMPLANT
GOWN STRL REUS W/TWL LRG LVL3 (GOWN DISPOSABLE) ×10 IMPLANT
GUIDEWIRE ANG ZIPWIRE 038X150 (WIRE) ×2 IMPLANT
GUIDEWIRE STR DUAL SENSOR (WIRE) ×2 IMPLANT
KIT ACCESSORY DA VINCI DISP (KITS) ×1
KIT ACCESSORY DVNC DISP (KITS) ×1 IMPLANT
KIT PROCEDURE DA VINCI SI (MISCELLANEOUS) ×1
KIT PROCEDURE DVNC SI (MISCELLANEOUS) ×1 IMPLANT
LABEL STERILE EO BLANK 1X3 WHT (LABEL) ×2 IMPLANT
LIQUID BAND (GAUZE/BANDAGES/DRESSINGS) ×4 IMPLANT
LOOP MINI RED (MISCELLANEOUS) ×2 IMPLANT
LOOP VESSEL MAXI BLUE (MISCELLANEOUS) ×2 IMPLANT
MANIFOLD NEPTUNE II (INSTRUMENTS) ×2 IMPLANT
NEEDLE INSUFFLATION 14GA 120MM (NEEDLE) ×2 IMPLANT
PACK CYSTO (CUSTOM PROCEDURE TRAY) ×2 IMPLANT
PACK ROBOT UROLOGY CUSTOM (CUSTOM PROCEDURE TRAY) ×2 IMPLANT
PEN SKIN MARKING BROAD (MISCELLANEOUS) ×2 IMPLANT
POSITIONER SURGICAL ARM (MISCELLANEOUS) ×4 IMPLANT
POUCH ENDO CATCH II 15MM (MISCELLANEOUS) ×2 IMPLANT
POUCH SPECIMEN RETRIEVAL 10MM (ENDOMECHANICALS) IMPLANT
PUMP PAIN ON-Q (MISCELLANEOUS) ×2 IMPLANT
RELOAD STAPLER GOLD 60MM (STAPLE) ×1 IMPLANT
RELOAD STAPLER GREEN 60MM (STAPLE) ×5 IMPLANT
RELOAD STAPLER WHITE 60MM (STAPLE) ×8 IMPLANT
RELOAD WHITE ECR60W (STAPLE) ×2 IMPLANT
SEAL CANN UNIV 5-8 DVNC XI (MISCELLANEOUS) IMPLANT
SEAL XI 5MM-8MM UNIVERSAL (MISCELLANEOUS)
SET IRRIG Y TYPE TUR BLADDER L (SET/KITS/TRAYS/PACK) ×2 IMPLANT
SET TUBE IRRIG SUCTION NO TIP (IRRIGATION / IRRIGATOR) ×2 IMPLANT
SOLUTION ELECTROLUBE (MISCELLANEOUS) ×2 IMPLANT
SPONGE LAP 18X18 X RAY DECT (DISPOSABLE) ×6 IMPLANT
SPONGE LAP 4X18 X RAY DECT (DISPOSABLE) ×2 IMPLANT
STAPLE ECHEON FLEX 60 POW ENDO (STAPLE) ×2 IMPLANT
STAPLER RELOAD GOLD 60MM (STAPLE) ×2
STAPLER RELOAD GREEN 60MM (STAPLE) ×10
STAPLER RELOAD WHITE 60MM (STAPLE) ×16
STENT SET URETHERAL LEFT 7FR (STENTS) ×2 IMPLANT
STENT SET URETHERAL RIGHT 7FR (STENTS) ×2 IMPLANT
SURGIFLO W/THROMBIN 8M KIT (HEMOSTASIS) IMPLANT
SUT CHROMIC 4 0 RB 1X27 (SUTURE) ×2 IMPLANT
SUT ETHILON 3 0 PS 1 (SUTURE) ×2 IMPLANT
SUT MNCRL AB 4-0 PS2 18 (SUTURE) ×4 IMPLANT
SUT PDS AB 0 CTX 36 PDP370T (SUTURE) ×8 IMPLANT
SUT SILK 3 0 SH 30 (SUTURE) IMPLANT
SUT SILK 3 0 SH CR/8 (SUTURE) ×2 IMPLANT
SUT VIC AB 2-0 SH 27 (SUTURE) ×2
SUT VIC AB 2-0 SH 27X BRD (SUTURE) ×1 IMPLANT
SUT VIC AB 2-0 UR5 27 (SUTURE) ×8 IMPLANT
SUT VIC AB 3-0 SH 27 (SUTURE) ×5
SUT VIC AB 3-0 SH 27X BRD (SUTURE) ×1 IMPLANT
SUT VIC AB 3-0 SH 27XBRD (SUTURE) ×3 IMPLANT
SUT VIC AB 4-0 PS2 18 (SUTURE) ×4 IMPLANT
SUT VIC AB 4-0 RB1 27 (SUTURE) ×5
SUT VIC AB 4-0 RB1 27XBRD (SUTURE) ×5 IMPLANT
SUT VLOC BARB 180 ABS3/0GR12 (SUTURE) ×2
SUTURE VLOC BRB 180 ABS3/0GR12 (SUTURE) ×1 IMPLANT
SYSTEM UROSTOMY GENTLE TOUCH (WOUND CARE) ×2 IMPLANT
TAPE STRIPS DRAPE STRL (GAUZE/BANDAGES/DRESSINGS) ×2 IMPLANT
TOWEL OR NON WOVEN STRL DISP B (DISPOSABLE) ×2 IMPLANT
TROCAR 12M 150ML BLUNT (TROCAR) ×2 IMPLANT
TROCAR BLADELESS 15MM (ENDOMECHANICALS) ×2 IMPLANT
TUBING CONNECTING 10 (TUBING) ×2 IMPLANT
URINEMETER 200ML W/220 (MISCELLANEOUS) ×2 IMPLANT
WATER STERILE IRR 1000ML UROMA (IV SOLUTION) ×2 IMPLANT
WATER STERILE IRR 1500ML POUR (IV SOLUTION) ×4 IMPLANT
YANKAUER SUCT BULB TIP 10FT TU (MISCELLANEOUS) IMPLANT

## 2014-09-09 NOTE — Anesthesia Postprocedure Evaluation (Signed)
  Anesthesia Post-op Note  Patient: Danny Wood  Procedure(s) Performed: Procedure(s) (LRB): ROBOTIC ASSISTED LAPAROSCOPIC COMPLETE CYSTOPROSTATECTOMY,  ILEAL CONDUIT (N/A) CYSTOSCOPY WITH INDOCYANINE GREEN DYE INJECTION AND URETHRAL DILATION (N/A)  Patient Location: PACU  Anesthesia Type: General  Level of Consciousness: awake and alert   Airway and Oxygen Therapy: Patient Spontanous Breathing  Post-op Pain: mild  Post-op Assessment: Post-op Vital signs reviewed, Patient's Cardiovascular Status Stable, Respiratory Function Stable, Patent Airway and No signs of Nausea or vomiting  Last Vitals:  Filed Vitals:   09/09/14 1733  BP: 165/78  Pulse: 95  Temp: 37.2 C  Resp: 17    Post-op Vital Signs: stable   Complications: No apparent anesthesia complications

## 2014-09-09 NOTE — Anesthesia Preprocedure Evaluation (Addendum)
Anesthesia Evaluation  Patient identified by MRN, date of birth, ID band Patient awake    Reviewed: Allergy & Precautions, NPO status , Patient's Chart, lab work & pertinent test results  Airway Mallampati: II  TM Distance: >3 FB Neck ROM: Full    Dental no notable dental hx.    Pulmonary former smoker,  breath sounds clear to auscultation  Pulmonary exam normal       Cardiovascular negative cardio ROS  Rhythm:Regular Rate:Normal     Neuro/Psych negative neurological ROS  negative psych ROS   GI/Hepatic negative GI ROS, Neg liver ROS,   Endo/Other  negative endocrine ROS  Renal/GU negative Renal ROS  negative genitourinary   Musculoskeletal negative musculoskeletal ROS (+)   Abdominal   Peds negative pediatric ROS (+)  Hematology negative hematology ROS (+)   Anesthesia Other Findings   Reproductive/Obstetrics negative OB ROS                            Anesthesia Physical Anesthesia Plan  ASA: II  Anesthesia Plan: General   Post-op Pain Management:    Induction: Intravenous  Airway Management Planned:   Additional Equipment:   Intra-op Plan:   Post-operative Plan: Extubation in OR  Informed Consent: I have reviewed the patients History and Physical, chart, labs and discussed the procedure including the risks, benefits and alternatives for the proposed anesthesia with the patient or authorized representative who has indicated his/her understanding and acceptance.   Dental advisory given  Plan Discussed with: CRNA  Anesthesia Plan Comments: (No tylenol. LFT's evelated)        Anesthesia Quick Evaluation

## 2014-09-09 NOTE — Progress Notes (Signed)
eLink Physician-Brief Progress Note Patient Name: Danny Wood DOB: Nov 20, 1950 MRN: [id number]   Date of Service  09/09/2014  HPI/Events of Note  64 yo with bladder cancer s/p ROBOTIC ASSISTED LAPAROSCOPIC COMPLETE CYSTOPROSTATECTOMY, ILEAL CONDUIT (N/A) CYSTOSCOPY WITH INDOCYANINE GREEN DYE INJECTION AND URETHRAL DILATION (N/A).  Oxygenation, hemodynamics stable.  eICU Interventions  No elink interventions at this time.        Raimundo Corbit 09/09/2014, 5:54 PM

## 2014-09-09 NOTE — Progress Notes (Signed)
Day of Surgery  Subjective:  1 - Muscle Invasive Bladder Cancer - T2G3 bladder cancer by TURBT 05/2014 by Dr Jeffie Pollock on eval of bladder thickening and gross hematuria. Upper tract eval negative. CT w/o advanced or distant disease. Also some CIS at bladder neck but urethral biopsies benign. He has been extensively couselled by Dr. Jeffie Pollock, myself, and Dr. Alen Blew and now s/p neo-adjuvant chemo with plan for cystectomy with ileal conduit today. Pre op labs acceptable, complete bowel prep.   2 - Left Simple Renal Cyst - 3cm left simple cyst by CT 05/2014.  3 - Renal Cell Carcinoma - s/p open right partial 2001. No recurrence with most recent surveillance.  4 - Bilateral Inguinal Hernias - incidental fat-containing hernias. NO h/o strangulation.  Today " Danny Wood " is seen to proceed with cystectomy.  Objective: Vital signs in last 24 hours: Temp:  [97.7 F (36.5 C)-98 F (36.7 C)] 97.8 F (36.6 C) (01/08 0449) Pulse Rate:  [58-70] 70 (01/08 0449) Resp:  [16-18] 18 (01/08 0449) BP: (132-139)/(67-74) 132/67 mmHg (01/08 0449) SpO2:  [98 %-100 %] 98 % (01/07 2144) Weight:  [93.441 kg (206 lb)] 93.441 kg (206 lb) (01/07 1333) Last BM Date: 09/08/14  Intake/Output from previous day: 01/07 0701 - 01/08 0700 In: 142.5 [I.V.:142.5] Out: -  Intake/Output this shift:    General appearance: alert, cooperative and appears stated age Head: Normocephalic, without obvious abnormality, atraumatic Nose: Nares normal. Septum midline. Mucosa normal. No drainage or sinus tenderness. Throat: lips, mucosa, and tongue normal; teeth and gums normal Neck: supple, symmetrical, trachea midline Back: symmetric, no curvature. ROM normal. No CVA tenderness. Resp: non-labored on room air Cardio: Nl rate GI: soft, non-tender; bowel sounds normal; no masses,  no organomegaly and old rt flank incision Extremities: extremities normal, atraumatic, no cyanosis or edema Pulses: 2+ and symmetric Skin: Skin color, texture,  turgor normal. No rashes or lesions Lymph nodes: Cervical, supraclavicular, and axillary nodes normal. Neurologic: Grossly normal  Lab Results:   Recent Labs  09/08/14 1416  WBC 6.4  HGB 12.8*  HCT 36.9*  PLT 174   BMET  Recent Labs  09/08/14 1416  NA 138  K 4.4  CL 105  CO2 28  GLUCOSE 114*  BUN 16  CREATININE 0.99  CALCIUM 9.0   PT/INR No results for input(s): LABPROT, INR in the last 72 hours. ABG No results for input(s): PHART, HCO3 in the last 72 hours.  Invalid input(s): PCO2, PO2  Studies/Results: No results found.  Anti-infectives: Anti-infectives    Start     Dose/Rate Route Frequency Ordered Stop   09/09/14 0600  piperacillin-tazobactam (ZOSYN) IVPB 3.375 g     3.375 g100 mL/hr over 30 Minutes Intravenous On call to O.R. 09/08/14 1329 09/09/14 0630      Assessment/Plan:  1 - Muscle Invasive Bladder Cancer - Pt has very good understanidng of risks / benefits, proceed as planned.   2 - Left Simple Renal Cyst - simple, observe.  3 - Renal Cell Carcinoma - will be observed on continued surveillance.  4 - Bilateral Inguinal Hernias - May close internally pending anatomy.    LOS: 1 day    [facility], Justiss Gerbino 09/09/2014

## 2014-09-09 NOTE — Transfer of Care (Signed)
Immediate Anesthesia Transfer of Care Note  Patient: Danny Wood  Procedure(s) Performed: Procedure(s): ROBOTIC ASSISTED LAPAROSCOPIC COMPLETE CYSTOPROSTATECTOMY,  ILEAL CONDUIT (N/A) CYSTOSCOPY WITH INDOCYANINE GREEN DYE INJECTION AND URETHRAL DILATION (N/A)  Patient Location: PACU  Anesthesia Type:MAC  Level of Consciousness: awake, alert  and oriented  Airway & Oxygen Therapy: Patient Spontanous Breathing and Patient connected to face mask oxygen  Post-op Assessment: Report given to PACU RN and Post -op Vital signs reviewed and stable  Post vital signs: Reviewed and stable  Complications: No apparent anesthesia complications

## 2014-09-09 NOTE — Brief Op Note (Signed)
09/08/2014 - 09/09/2014  3:43 PM  PATIENT:  Danny Wood  64 y.o. male  PRE-OPERATIVE DIAGNOSIS:  BLADDER CANCER  POST-OPERATIVE DIAGNOSIS:  BLADDER CANCER  PROCEDURE:  Procedure(s): ROBOTIC ASSISTED LAPAROSCOPIC COMPLETE CYSTOPROSTATECTOMY,  ILEAL CONDUIT (N/A) CYSTOSCOPY WITH INDOCYANINE GREEN DYE INJECTION AND URETHRAL DILATION (N/A)  SURGEON:  Surgeon(s) and Role:    * Alexis Frock, MD - Primary  PHYSICIAN ASSISTANT:   ASSISTANTS: Clemetine Marker PA, Charlton Haws MD   ANESTHESIA:   local and general  EBL:  Total I/O In: 2700 [I.V.:2700] Out: 550 [Urine:300; Blood:250]  BLOOD ADMINISTERED:none  DRAINS: 1 - JP to bulb, 2 - Urostomy to drain with Rt red Lt blue Bander stents in situ.    LOCAL MEDICATIONS USED:  MARCAINE     SPECIMEN:  Source of Specimen:  1 -cystoprostatectomy, 2 - pelvic lymph nodes, 3 ureeral margins, 4 - urethral margin  DISPOSITION OF SPECIMEN:  PATHOLOGY  COUNTS:  YES  TOURNIQUET:  * No tourniquets in log *  DICTATION: .Other Dictation: Dictation Number [id number]  PLAN OF CARE: Admit to inpatient   PATIENT DISPOSITION:  PACU - hemodynamically stable.   Delay start of Pharmacological VTE agent (>24hrs) due to surgical blood loss or risk of bleeding: yes

## 2014-09-09 NOTE — Anesthesia Procedure Notes (Signed)
Procedure Name: Intubation Date/Time: 09/09/2014 7:40 AM Performed by: Dimas Millin, Saidee Geremia F Pre-anesthesia Checklist: Patient identified, Emergency Drugs available, Suction available, Patient being monitored and Timeout performed Patient Re-evaluated:Patient Re-evaluated prior to inductionOxygen Delivery Method: Circle system utilized Preoxygenation: Pre-oxygenation with 100% oxygen Intubation Type: IV induction Ventilation: Mask ventilation without difficulty Laryngoscope Size: Mac and 3 Grade View: Grade II Tube type: Oral Tube size: 7.5 mm Number of attempts: 1 Airway Equipment and Method: Stylet Placement Confirmation: positive ETCO2 and breath sounds checked- equal and bilateral Secured at: 22 cm Dental Injury: Teeth and Oropharynx as per pre-operative assessment

## 2014-09-09 NOTE — Progress Notes (Signed)
Pt did not finish [place], but stools are most clear with no solid stool particles, just a light brown coloring.

## 2014-09-10 LAB — BASIC METABOLIC PANEL
Anion gap: 8 (ref 5–15)
BUN: 17 mg/dL (ref 6–23)
CO2: 25 mmol/L (ref 19–32)
Calcium: 8 mg/dL — ABNORMAL LOW (ref 8.4–10.5)
Chloride: 103 mEq/L (ref 96–112)
Creatinine, Ser: 1.23 mg/dL (ref 0.50–1.35)
GFR calc Af Amer: 70 mL/min — ABNORMAL LOW (ref >90)
GFR calc non Af Amer: 61 mL/min — ABNORMAL LOW (ref >90)
Glucose, Bld: 246 mg/dL — ABNORMAL HIGH (ref 70–99)
Potassium: 4.4 mmol/L (ref 3.5–5.1)
Sodium: 136 mmol/L (ref 135–145)

## 2014-09-10 LAB — CBC
HCT: 30.4 % — ABNORMAL LOW (ref 39.0–52.0)
Hemoglobin: 10.7 g/dL — ABNORMAL LOW (ref 13.0–17.0)
MCH: 30.8 pg (ref 26.0–34.0)
MCHC: 35.2 g/dL (ref 30.0–36.0)
MCV: 87.6 fL (ref 78.0–100.0)
Platelets: 158 10*3/uL (ref 150–400)
RBC: 3.47 MIL/uL — ABNORMAL LOW (ref 4.22–5.81)
RDW: 13.9 % (ref 11.5–15.5)
WBC: 9.4 10*3/uL (ref 4.0–10.5)

## 2014-09-10 MED ORDER — MENTHOL 3 MG MT LOZG
1.0000 | LOZENGE | OROMUCOSAL | Status: DC | PRN
  Filled 2014-09-10: qty 9

## 2014-09-10 NOTE — Op Note (Signed)
NAME:  Danny Wood, Danny Wood NO.:  [id number]  MEDICAL RECORD NO.:  [id number]  LOCATION:  6803                         FACILITY:  [facility]  PHYSICIAN:  Alexis Frock, MD     DATE OF BIRTH:  [birth year]  DATE OF PROCEDURE: 09/09/2014  DATE OF DISCHARGE:                              OPERATIVE REPORT   DIAGNOSIS:  High-grade muscle invasive bladder cancer.  PROCEDURE: 1. Robotic-assisted laparoscopic radical cystoprostatectomy. 2. Cystoscopy with injection of indocyanine green dye. 3. Dilation of urethral stricture with balloon.  ASSISTANTS: 1. Clemetine Marker, PA. 2. Charlton Haws, MD.  ESTIMATED BLOOD LOSS:  300 mL.  DRAINS: 1. Terrial Rhodes drain to bulb suction. 2. Urostomy to bag drainage with bander stents in situ, right is red     and left is blue.  SPECIMENS: 1. Radical cystoprostatectomy. 2. Right distal ureteral margin. 3. Left distal ureteral margin. 4. Distal urethral margin. 5. Right external iliac lymph nodes. 6. Right obturator lymph nodes. 7. Right internal iliac lymph nodes, sentinel. 8. Right common iliac lymph nodes. 9. Left external iliac lymph node, sentinel. 10.Left obturator lymph node, sentinel. 11.Left common iliac lymph nodes.  FINDINGS: 1. Very dense high grade urethral stricture at the level of the     membranous urethra.  This would not accommodate cystoscopy or Foley     catheter placement.  This necessitated balloon dilation with 20-     [place]. 2. Multifocal fluorescent sentinel lymph nodes including nodes in the     right internal iliac group, left external iliac and obturator     groups.  INDICATION:  Danny Wood is a pleasant, 64 year old gentleman, who was found on workup for gross hematuria to have muscle invasive high- grade bladder cancer by my colleague, Dr. Jeffie Pollock.  The volume of disease was not large.  He underwent staging, which revealed no locally advanced or distant disease.  He wished to proceed with  maximally aggressive path with neoadjuvant chemotherapy followed by radical cystoprostatectomy. He underwent neoadjuvant and chemotherapy under the care Dr. Alen Blew and did very well with this.  He had interval CAT scan last month, which revealed no additional new worrisome findings after chemotherapy.  He then wished to proceed with cystectomy of ilial conduit as previously discussed.  Informed consent was obtained and placed on medical record.  PROCEDURE IN DETAIL:  The patient being Danny Wood was confirmed, procedure being robotic cystoprostatectomy with injection of indocyanine green dye was confirmed.  Procedure was carried out.  Time-out was performed.  Intravenous antibiotics administered.  General endotracheal anesthesia was introduced.  The patient initially placed into a low lithotomy position.  Sterile field was created prepping and draping the patient's penis, perineum, proximal thighs using iodine x3.  Next, cystourethroscopy was performed using a 24-French injection set, the scope at 0 degree lens.  Inspection of anterior urethra was unremarkable.  Inspection of posterior urethra revealed an incredibly dense membranous urethral stricture and this would not easily pass the cystoscope whatsoever.  As such, a 16-French flexible cystoscope was used to visualize the area and with this angulation, a very high grade stricture was indeed seen.  A Sensor wire was advanced across this and this was a balloon dilation apparatus, which  was inflated to pressure of 20 atmospheres.  It was held for 30 seconds and then removed.  This then allowed a successful navigation to the level of the urinary bladder. The flexible cystoscope was then exchanged once again with a 24-French. Injection scope was then easily passed to the level of the urinary bladder following dilation and 3 mL of indocyanine green dye was instilled in 0.5 mL aliquots and submucosal bleb surround the area of previous  tumor, which was visualized as a scar in the left trigone bladder wall area and bladder neck.  The patient was then placed into a steep Trendelenburg position over 20 degrees and made supine.  His arms were tucked at his sides.  All bony prominences were properly padded. He was additionally fashioned in the operating table using 3-inch tape over foam padding across his chest and a sterile field was created by first prepping and shaving, then prepping and draping the infraxiphoid abdomen using chlorhexidine gluconate.  Next, penis, perineum, and proximal thighs using iodine x3.  A new 18-French Councill catheter was placed using a 16-French flexible cystoscopic guidance over wire, given his high-grade stricture.  Next, a high-flow low pressure pneumoperitoneum was obtained using Veress technique in the supraumbilical midline having passed the aspiration and drop test.  An 8 mm robotic arm camera port was placed in the same location.  Laparoscopic examination of peritoneal cavity revealed no significant adhesions and no additional visceral injury.  Additional ports were then placed as follows; right paramedian 8-mm robotic port, right far lateral 12-mm assist port, right paramedian 15-mm assist port at the previously noted conduit site, left paramedian 8-mm robotic port, left far lateral 8-mm robotic port.  Robot was docked and passed through electronic checks.  Attention was directed at identification of the ureters, first in the left side.  Incision was made lateral to the descending colon, an area over the presumed iliac vessels and the colon was carefully swept medially.  The ureter was encountered.  Vessel loop was applied, this was dissected distally to the ureterovesical junction, was doubly clipped, the proximal clip having a suture marker.  This was transected coldly.  The distal margin was sent, it was negative for carcinoma on frozen section.  It was dissected proximally,  circumferentially for a distance of approximately 4 cm above the iliac crossing.  Great care was taken to avoid skeletonization of the ureter, this did not occur.  Vas deferens was also purposely ligated and used as bucket handle to provide medial traction on the bladder and dissection proceeded towards the endopelvic fascia on the left side and through the left lateral plane.  Mirror image dissection was performed on the right side.  This was in the right ureter, as it coursed across the common iliac vessels to the ureterovesical junction ligating the ureter twice with cold clips with the proximal being a marked suture that was dyed.  The proximal representative section was sent for frozen section and found to be negative for carcinoma.  This was dissected for a distance of approximately 2 cm above the common iliac vessels.  Vas deferens ligated and used as a bucket handle to provide gentle and medial traction to the bladder, which was then carefully swept away from the sidewall towards the endopelvic fascia on the right side.  Posterior peritoneal flap was then developed by connecting the 2 previous peritoneal incisions and creating the flap and placing the flap posteriorly, thus developing the posterior plane towards the area of the prostate  and then towards the apex of the prostate.  This exposed the bladder pedicles, which were then controlled using vascular load stapler x3 on each side.  It also helped control the proximal portion of the prostatic pedicle.  Given the patient's young age and potency, bilateral nerve sparing was performed by sweeping the presumed neurovascular tissue off the posterolateral aspect of the prostate at base to apex orientation.  Also given potency and little concern for prostate cancer in this case, the bilateral seminal vesicles were not dissected out completely and left in situ. The posterior flap was completely developed towards the area of the apex of  the prostate to avoid rectal injury, which did not occur grossly. Next, anterior dissection was performed by entrapping the anterior portion of the bladder between the medial umbilical ligaments and the anterior apical area, the dorsal venous complex was encountered and controlled with vascular stapler.  This exposed the membranous urethra, which was transected coldly, the distal aspect of which represented a margin, was sent for frozen section and found to be negative for carcinoma.  The previous Foley catheter was purposely clipped and ligated and used as a bucket handle and this completely freed up the radical cystoprostatectomy specimen, which was placed to extra large bag for later retrieval.  The membranous urethral stump was oversewn using 3- 0 V-Loc x2.  Lymphadenectomy was then carefully performed.  The entire pelvis was inspected under near infrared fluorescence and multiple sentinel lymph nodes were seen, mostly in the left hemipelvis and some on the right as well.  A template lymphadenectomy was then performed on the right side, all fiber, fatty tissue confined the right external iliac artery, vein, pelvic sidewall, and iliac bifurcation were carefully mobilized.  Hemostasis achieved with cold clips and set aside for right external iliac lymph nodes. Similarly, the right obturator packet was carefully mobilized and components being the right external iliac, vein, obturator nerve, pelvic sidewall.  This tissue was set aside labelled obturator lymph nodes.  The tissue overlying the internal iliac artery was carefully dissected away with hemostasis again achieved with cold clips.  There were several sentinel lymph nodes in this packet, and was labeled as such.  The right common iliac lymph nodes are also carefully developed and there did appear to be sentinel lymph node within this packet and it was set aside as well.  Similarly, a mirror image template was performed on the left  side, again at the level of the left external iliac, left obturator, internal iliac, and common iliac lymph node groups.  There were multiple sentinel lymph nodes.  These were noted as such with the individual packets on the left.  Great care was taken to avoid injury to the obturator nerves bilaterally and this did not occur grossly.  Next, the left ureter was brought underneath the descending colon by developing the retroperitoneum from the right to the left side and bringing in a retroperitoneal tunnel underneath the descending colon towards the right side.  This brought the right and left ureter into close proximity.  They were both marked with tag sutures for later identification.  A digital rectal exam was performed under robotic vision, no rectal violation was noted.  Robot was then undocked. Specimen was retrieved by extending the previous camera port site inferiorly for a total distance of approximately 7 cm and removing the Cystectomy specimen and setting aside for permanent pathology.  Omnitract  retractor was then used.  The bowel was carefully retracted to allow visualization  of the terminal ileum and cecal area and a segment of the terminal ileum at 15 cm in length, 15 cm proximal to the ileocecal junction was taken out of continuity using bowel load stapler.  The mesentery was developed for additional 4 cm proximally and distally using a vascular load stapler, taking great care to avoid devascularization of the conduit segment and this did not occur.  The conduit segment was distal end and was labeled as such.  It was retro- peritonealized and the bowel was brought back in continuity using a side- to-side staple anastomosis with bowel load stapler x2.  The free end was oversewn with a running silk and with a second imbricating layer of running silk, the mesentery defect was reapproximated using interrupted silk in the acute angle.  The anastomotic staple line was  buttressed using interrupted silk.  The bowel anastomosis was visibly viable and palpably patent.  This was allowed to lie back in anatomic position in the abdominal cavity.  Attention was directed to a ureteroileal anastomosis.  First, the proximal staple line of the proximal end of the conduit was oversewn using Vicryl suture, to avoid encrustation and a small area of proximal conduit was incised and this 4 mm section being removed.  The bowel mucosa was everted x4 and the right ureter was anastomosed to this, first with an anchor stitch of 4-0 Vicryl, a right red colored bander stent placed 22 cm to the anastomosis and then 2 running suture lines of 4-0 Vicryl in the anterior and posterior anastomosis bringing them in tension-free apposition to the bowel.  A mirror image on left renal anastomosis was similarly performed, everting the bowel mucosa and placing a blue-colored bander stent on the left 24 cm to the anastomosis.  Both bander stents were anchored using a single chromic suture within the conduit to prevent early removal.  The previously marked conduit site was then circumferentially excised, the diameter approximately a quarter and a column of fat was dissected towards the area of the fascia and a cruciate incision was made and four 2-0 Vicryl sutures were placed at the apices of these.  The distal conduit segment was brought through this such that it lay approximately 4 cm beyond the skin and the previously applied anchor sutures were tucked into the conduit in interrupted fashion to prevent internal hernias.  A rosebud type maturation of the stoma was then performed using interrupted 2-0 Vicryl x4 with intervening 3-0 sutures at the skin, resulted in excellent and visibly viable conduit stoma with the bander stents exiting with vigorous efflux of urine from these.  The bowel and ureter, needle anastomosis were inspected again at the extraction site, the retractor was  removed.  Close suction drain was brought through the previous left lateral most robotic port site to the peritoneal cavity. The extraction site was closed with fascia in figure-of-eight PDS x6. On-Q type pain catheter was applied above the fascia and Scarpa was reapproximated using running Vicryl.  All skin incisions were infiltrated with dilute Marcaine and closed at level of skin using subcuticular Monocryl followed by Dermabond.  Procedure was then terminated.  The patient tolerated the procedure well.  There were no immediate periprocedural complications.  The patient was taken to postanesthesia care unit in stable condition with plan for step-down admission tonight.          ______________________________ Alexis Frock, MD     TM/MEDQ  D:  09/09/2014  T:  09/10/2014  Job:  [id number]

## 2014-09-10 NOTE — Progress Notes (Signed)
1 Day Post-Op Subjective: Patient reports relatively good pain control. He did not sleep well last night due to alarms. He complains of a sore throat. He is yet to be out of bed in a chair or ambulated. Nursing staff reports no concerns or obvious issues.  Objective: Vital signs in last 24 hours: Temp:  [97.4 F (36.3 C)-98.9 F (37.2 C)] 98.5 F (36.9 C) (01/08 2000) Pulse Rate:  [75-98] 75 (01/09 0500) Resp:  [9-18] 11 (01/09 0500) BP: (116-200)/(58-154) 146/58 mmHg (01/09 0500) SpO2:  [94 %-100 %] 99 % (01/09 0500)  Intake/Output from previous day: 01/08 0701 - 01/09 0700 In: 4452.1 [I.V.:4152.1; IV Piggyback:300] Out: 2220 [Urine:400; Drains:20; ZCHYI:5027; Blood:250] Intake/Output this shift:    Physical Exam:  Constitutional: Vital signs reviewed. WD WN in NAD   Eyes: PERRL, No scleral icterus.   Cardiovascular: RRR Pulmonary/Chest: Normal effort Abdominal: Soft. Slight distention. Stoma pink with stents that appear to be well positioned. Incisions all without active oozing Drain site okay Genitourinary: Mild edema Extremities: No cyanosis or edema   Lab Results:  Recent Labs  09/08/14 1416 09/09/14 1625 09/10/14 0350  HGB 12.8* 11.8* 10.7*  HCT 36.9* 33.2* 30.4*   BMET  Recent Labs  09/08/14 1416 09/10/14 0350  NA 138 136  K 4.4 4.4  CL 105 103  CO2 28 25  GLUCOSE 114* 246*  BUN 16 17  CREATININE 0.99 1.23  CALCIUM 9.0 8.0*   No results for input(s): LABPT, INR in the last 72 hours. No results for input(s): LABURIN in the last 72 hours. Results for orders placed or performed during the hospital encounter of 09/08/14  MRSA PCR Screening     Status: None   Collection Time: 09/08/14 10:08 PM  Result Value Ref Range Status   MRSA by PCR NEGATIVE NEGATIVE Final    Comment:        The GeneXpert MRSA Assay (FDA approved for NASAL specimens only), is one component of a comprehensive MRSA colonization surveillance program. It is not intended to  diagnose MRSA infection nor to guide or monitor treatment for MRSA infections.     Studies/Results: No results found.  Assessment/Plan:   Doing well postop day #1 status post cystectomy. We'll ask nursing service to get out of bed and sitting in a chair this morning. Patient will transfer to the floor. We'll continue nothing by mouth status for now.   LOS: 2 days   [place] S 09/10/2014, 8:12 AM

## 2014-09-10 NOTE — Evaluation (Signed)
Physical Therapy Evaluation Patient Details Name: Danny Wood MRN: [id number] DOB: 25-May-1951 Today's Date: 09/10/2014   History of Present Illness  64 yo male adm 09/08/14 and is s/p ROBOTIC ASSISTED LAPAROSCOPIC COMPLETE CYSTOPROSTATECTOMY,  ILEAL CONDUIT; PMHx:  partial nephrectomy, lumbar discectomy  Clinical Impression  Pt will benefit from PT to address deficits below; he is very guarded, with unsteady gait at time of PT eval but was able to amb a good distance, he is painful with transitional movements, will follow    Follow Up Recommendations No PT follow up    Equipment Recommendations  None recommended by PT    Recommendations for Other Services       Precautions / Restrictions Precautions Precautions: Fall      Mobility  Bed Mobility Overal bed mobility: Needs Assistance Bed Mobility: Supine to Sit;Sidelying to Sit;Rolling;Sit to Sidelying Rolling: Min assist Sidelying to sit: Min assist;HOB elevated     Sit to sidelying: Mod assist General bed mobility comments: assist with trunk to come to sit and LEs onto bed to return to supine, incr time d/t pain  Transfers Overall transfer level: Needs assistance Equipment used: None Transfers: Sit to/from Stand Sit to Stand: Min assist         General transfer comment: incr time adn cues to control descent  Ambulation/Gait Ambulation/Gait assistance: Min guard;Supervision Ambulation Distance (Feet): 350 Feet Assistive device: None Gait Pattern/deviations: Decreased stride length;Wide base of support     General Gait Details: decr joint excursion throughout gait cycle, decr trunk rotation and absent arm swing; pt with guarded gait d/t surgical pain  Stairs            Wheelchair Mobility    Modified Rankin (Stroke Patients Only)       Balance Overall balance assessment: No apparent balance deficits (not formally assessed)                                            Pertinent Vitals/Pain Pain Assessment: 0-10 Pain Score: 5  Pain Descriptors / Indicators: Burning Pain Intervention(s): Limited activity within patient's tolerance;Monitored during session;PCA encouraged    Home Living Family/patient expects to be discharged to:: Private residence Living Arrangements: Spouse/significant other   Type of Home: House Home Access: Stairs to enter   Technical brewer of Steps: 3 Home Layout: One level Home Equipment: None      Prior Function Level of Independence: Independent         Comments: 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, travels     Journalist, newspaper        Extremity/Trunk Assessment   Upper Extremity Assessment: Defer to OT evaluation           Lower Extremity Assessment: Overall WFL for tasks assessed         Communication   Communication: No difficulties  Cognition Arousal/Alertness: Awake/alert Behavior During Therapy: Anxious Overall Cognitive Status: Within Functional Limits for tasks assessed                      General Comments      Exercises        Assessment/Plan    PT Assessment Patient needs continued PT services  PT Diagnosis Difficulty walking   PT Problem List Decreased activity tolerance;Decreased mobility;Pain  PT Treatment Interventions DME instruction;Gait training;Functional mobility training;Therapeutic exercise;Therapeutic activities;Patient/family education;Stair training  PT Goals (Current goals can be found in the Care Plan section) Acute Rehab PT Goals Patient Stated Goal: be well PT Goal Formulation: With patient Time For Goal Achievement: 09/17/14 Potential to Achieve Goals: Good    Frequency Min 3X/week   Barriers to discharge        Co-evaluation               End of Session   Activity Tolerance: Patient tolerated treatment well Patient left: in bed;with call bell/phone within reach Nurse Communication: Mobility status          Time: 8338-2505 PT Time Calculation (min) (ACUTE ONLY): 25 min   Charges:   PT Evaluation $Initial PT Evaluation Tier I: 1 Procedure PT Treatments $Gait Training: 23-37 mins   PT G Codes:        Derreck Wiltsey Oct 08, 2014, 6:03 PM

## 2014-09-11 LAB — CBC
HCT: 29 % — ABNORMAL LOW (ref 39.0–52.0)
Hemoglobin: 10.2 g/dL — ABNORMAL LOW (ref 13.0–17.0)
MCH: 30.9 pg (ref 26.0–34.0)
MCHC: 35.2 g/dL (ref 30.0–36.0)
MCV: 87.9 fL (ref 78.0–100.0)
Platelets: 139 10*3/uL — ABNORMAL LOW (ref 150–400)
RBC: 3.3 MIL/uL — ABNORMAL LOW (ref 4.22–5.81)
RDW: 13.7 % (ref 11.5–15.5)
WBC: 8.4 10*3/uL (ref 4.0–10.5)

## 2014-09-11 LAB — BASIC METABOLIC PANEL
Anion gap: 4 — ABNORMAL LOW (ref 5–15)
BUN: 13 mg/dL (ref 6–23)
CO2: 28 mmol/L (ref 19–32)
Calcium: 8.1 mg/dL — ABNORMAL LOW (ref 8.4–10.5)
Chloride: 101 mEq/L (ref 96–112)
Creatinine, Ser: 0.98 mg/dL (ref 0.50–1.35)
GFR calc Af Amer: >90 mL/min (ref >90)
GFR calc non Af Amer: 86 mL/min — ABNORMAL LOW (ref >90)
Glucose, Bld: 168 mg/dL — ABNORMAL HIGH (ref 70–99)
Potassium: 4.1 mmol/L (ref 3.5–5.1)
Sodium: 133 mmol/L — ABNORMAL LOW (ref 135–145)

## 2014-09-11 NOTE — Progress Notes (Signed)
2 Days Post-Op Subjective: Patient reports a relatively good pain control. He has no new complaints or concerns. He continues to have excellent urinary output via his ileal loop. No flatus/bowel movement. No severe nausea or emesis. He has ambulated in the hall. He has not slept well due to alarms going off in his room.  Objective: Vital signs in last 24 hours: Temp:  [97 F (36.1 C)-98.6 F (37 C)] 98.1 F (36.7 C) (01/10 0426) Pulse Rate:  [64-98] 98 (01/10 0426) Resp:  [9-20] 19 (01/10 0426) BP: (114-141)/(44-91) 141/64 mmHg (01/10 0426) SpO2:  [89 %-100 %] 99 % (01/10 0426)  Intake/Output from previous day: 01/09 0701 - 01/10 0700 In: 3012.5 [I.V.:3000; IV Piggyback:12.5] Out: 1370 [Drains:20; LNZVJ:2820] Intake/Output this shift:    Physical Exam:  Constitutional: Vital signs reviewed. WD WN in NAD   Eyes: PERRL, No scleral icterus.   Cardiovascular: RRR Pulmonary/Chest: Normal effort Abdominal: Soft. Mild diffuse tenderness. Slight distention. Stoma pink. Stents well-positioned. Genitourinary: No change Extremities: No cyanosis or edema   Lab Results:  Recent Labs  09/09/14 1625 09/10/14 0350 09/11/14 0525  HGB 11.8* 10.7* 10.2*  HCT 33.2* 30.4* 29.0*   BMET  Recent Labs  09/10/14 0350 09/11/14 0525  NA 136 133*  K 4.4 4.1  CL 103 101  CO2 25 28  GLUCOSE 246* 168*  BUN 17 13  CREATININE 1.23 0.98  CALCIUM 8.0* 8.1*   No results for input(s): LABPT, INR in the last 72 hours. No results for input(s): LABURIN in the last 72 hours. Results for orders placed or performed during the hospital encounter of 09/08/14  MRSA PCR Screening     Status: None   Collection Time: 09/08/14 10:08 PM  Result Value Ref Range Status   MRSA by PCR NEGATIVE NEGATIVE Final    Comment:        The GeneXpert MRSA Assay (FDA approved for NASAL specimens only), is one component of a comprehensive MRSA colonization surveillance program. It is not intended to diagnose  MRSA infection nor to guide or monitor treatment for MRSA infections.     Studies/Results: No results found.  Assessment/Plan:   Doing well postoperative day #2  Will continue with increased ambulation. We'll start clear liquids.  Routine postoperative care.   LOS: 3 days   Kerrick Miler S 09/11/2014, 7:55 AM

## 2014-09-12 ENCOUNTER — Encounter (HOSPITAL_COMMUNITY): Payer: Self-pay | Admitting: Urology

## 2014-09-12 LAB — TYPE AND SCREEN
ABO/RH(D): O POS
Antibody Screen: NEGATIVE
Unit division: 0
Unit division: 0

## 2014-09-12 LAB — CBC
HCT: 28 % — ABNORMAL LOW (ref 39.0–52.0)
Hemoglobin: 9.9 g/dL — ABNORMAL LOW (ref 13.0–17.0)
MCH: 30.7 pg (ref 26.0–34.0)
MCHC: 35.4 g/dL (ref 30.0–36.0)
MCV: 87 fL (ref 78.0–100.0)
Platelets: 147 10*3/uL — ABNORMAL LOW (ref 150–400)
RBC: 3.22 MIL/uL — ABNORMAL LOW (ref 4.22–5.81)
RDW: 13.1 % (ref 11.5–15.5)
WBC: 6.3 10*3/uL (ref 4.0–10.5)

## 2014-09-12 LAB — BASIC METABOLIC PANEL
Anion gap: 7 (ref 5–15)
BUN: 10 mg/dL (ref 6–23)
CO2: 28 mmol/L (ref 19–32)
Calcium: 8.3 mg/dL — ABNORMAL LOW (ref 8.4–10.5)
Chloride: 100 mEq/L (ref 96–112)
Creatinine, Ser: 0.83 mg/dL (ref 0.50–1.35)
GFR calc Af Amer: >90 mL/min (ref >90)
GFR calc non Af Amer: >90 mL/min (ref >90)
Glucose, Bld: 159 mg/dL — ABNORMAL HIGH (ref 70–99)
Potassium: 3.9 mmol/L (ref 3.5–5.1)
Sodium: 135 mmol/L (ref 135–145)

## 2014-09-12 MED ORDER — BISACODYL 10 MG RE SUPP
10.0000 mg | Freq: Every day | RECTAL | Status: DC
  Administered 2014-09-12 – 2014-09-14 (×3): 10 mg via RECTAL
  Filled 2014-09-12 (×3): qty 1

## 2014-09-12 MED ORDER — BOOST / RESOURCE BREEZE PO LIQD
1.0000 | Freq: Three times a day (TID) | ORAL | Status: DC
  Administered 2014-09-12 – 2014-09-14 (×2): 1 via ORAL

## 2014-09-12 MED ORDER — HEPARIN SODIUM (PORCINE) 5000 UNIT/ML IJ SOLN
5000.0000 [IU] | Freq: Three times a day (TID) | INTRAMUSCULAR | Status: DC
  Administered 2014-09-12 – 2014-09-15 (×11): 5000 [IU] via SUBCUTANEOUS
  Filled 2014-09-12 (×13): qty 1

## 2014-09-12 NOTE — Progress Notes (Signed)
3 Days Post-Op  Subjective:  1 - Muscle Invasive Bladder Cancer -  S/p robotic cystoprostatecotmy with ICG dye and bilateral template + sentinal lymphadenectomy and ileal conduit urinary diversion 09/09/2014. Path pending. Transferred to floor 09/10/14. On-Q pain cathter removed 09/12/14.   2 - Post-op Ileus- s/p bowel anastamosis as per above. Clears started 09/11/14.  3 - Disposition / Rehab - PT eval likely home w/o needs. Wound-osomty helping with new urostomy teaching.   Today " Mounir " is stable. Having quite a bit of skin sensitivity / pain, but little deep abdominal / MSK pain. NO emesis. NO flatus / BM.   Objective: Vital signs in last 24 hours: Temp:  [98.2 F (36.8 C)-99.1 F (37.3 C)] 98.2 F (36.8 C) (01/11 0541) Pulse Rate:  [85-110] 85 (01/11 0541) Resp:  [14-19] 18 (01/11 0541) BP: (143-155)/(59-77) 143/66 mmHg (01/11 0541) SpO2:  [97 %-100 %] 99 % (01/11 0541) Last BM Date: 09/08/14  Intake/Output from previous day: 01/10 0701 - 01/11 0700 In: 1722.1 [P.O.:120; I.V.:1602.1] Out: 4310 [Drains:10; EHMCN:4709] Intake/Output this shift:    General appearance: alert, cooperative and appears stated age Eyes: negative Nose: Nares normal. Septum midline. Mucosa normal. No drainage or sinus tenderness., On [place] O2 per PCA req. Throat: lips, mucosa, and tongue normal; teeth and gums normal Neck: supple, symmetrical, trachea midline Back: symmetric, no curvature. ROM normal. No CVA tenderness. Resp: non-labored Cardio: Nl rate GI: soft, non-tender; bowel sounds normal; no masses,  no organomegaly Male genitalia: normal Extremities: extremities normal, atraumatic, no cyanosis or edema Pulses: 2+ and symmetric Skin: Skin color, texture, turgor normal. No rashes or lesions Lymph nodes: Cervical, supraclavicular, and axillary nodes normal. Neurologic: Grossly normal Incision/Wound: RLQ Urostomy pink / patent with bander stents in situ. Left JP with stable serosanguinous flud  that is simple. All incision sites c/d/i. On - Q removed.   Lab Results:   Recent Labs  09/11/14 0525 09/12/14 0428  WBC 8.4 6.3  HGB 10.2* 9.9*  HCT 29.0* 28.0*  PLT 139* 147*   BMET  Recent Labs  09/11/14 0525 09/12/14 0428  NA 133* 135  K 4.1 3.9  CL 101 100  CO2 28 28  GLUCOSE 168* 159*  BUN 13 10  CREATININE 0.98 0.83  CALCIUM 8.1* 8.3*   PT/INR No results for input(s): LABPROT, INR in the last 72 hours. ABG No results for input(s): PHART, HCO3 in the last 72 hours.  Invalid input(s): PCO2, PO2  Studies/Results: No results found.  Anti-infectives: Anti-infectives    Start     Dose/Rate Route Frequency Ordered Stop   09/09/14 1800  piperacillin-tazobactam (ZOSYN) IVPB 3.375 g     3.375 g12.5 mL/hr over 240 Minutes Intravenous Every 8 hours 09/09/14 1748 09/11/14 1410   09/09/14 0600  piperacillin-tazobactam (ZOSYN) IVPB 3.375 g     3.375 g100 mL/hr over 30 Minutes Intravenous On call to O.R. 09/08/14 1329 09/09/14 0630      Assessment/Plan:  1 - Muscle Invasive Bladder Cancer -  Doing well POD 3. Path pending. Strongly encouraged ambulation. Remain PCA until tollerating reg diet.   2 - Post-op Ileus - remain on clears until flatus / BM. Encouraged ambulation, being upright, chewing gum. Daily ducolax SPP until BM.   3 - Disposition / Rehab - appreciate PT and ostomy RN help, final dispo pending. Remain in house.   [facility], Yasseen Salls 09/12/2014

## 2014-09-12 NOTE — Progress Notes (Signed)
INITIAL NUTRITION ASSESSMENT  DOCUMENTATION CODES Per approved criteria  -Not Applicable   INTERVENTION: Resource Breeze po TID, each supplement provides 250 kcal and 9 grams of protein  NUTRITION DIAGNOSIS: Inadequate oral intake related to inability to eat as evidenced by NPO and clear liquid diets.   Goal: Pt to meet >/= 90% of their estimated nutrition needs   Monitor:  Weight trend, po intake, acceptance of supplements, diet advancement, labs  Reason for Assessment: MST  64 y.o. male  Admitting Dx: <principal problem not specified>  ASSESSMENT: Pt with history of bladder cancer.   1/08: S/P ROBOTIC ASSISTED LAPAROSCOPIC COMPLETE CYSTOPROSTATECTOMY, ILEAL CONDUIT (N/A) CYSTOSCOPY WITH INDOCYANINE GREEN DYE INJECTION AND URETHRAL DILATION (N/A)  - Pt's current diet is clear liquid. He said that the cranberry juice is causing him to have reflux. Otherwise, pt is tolerating clears. He says that some of the clear liquids are a little tart for his taste. Agreed to try [employer]. Pt with no significant weight loss recently. He doesn't feel hungry at this time due to pain from surgery. Pt's not will not be upgraded until he has flatus or BM.   Labs: Na and K WNL BUN WNL  Height: Ht Readings from Last 1 Encounters:  09/10/14 6' (1.829 m)    Weight: Wt Readings from Last 1 Encounters:  09/08/14 206 lb (93.441 kg)    Ideal Body Weight: 77.6 kg  % Ideal Body Weight: 120%  Wt Readings from Last 10 Encounters:  09/08/14 206 lb (93.441 kg)  08/12/14 208 lb 11.2 oz (94.666 kg)  07/29/14 207 lb 14.4 oz (94.303 kg)  07/15/14 205 lb 3.2 oz (93.078 kg)  07/08/14 215 lb 1.6 oz (97.569 kg)  06/24/14 208 lb 4.8 oz (94.484 kg)  06/17/14 215 lb 1.6 oz (97.569 kg)  06/01/14 214 lb (97.07 kg)  05/17/14 214 lb (97.07 kg)  08/21/12 214 lb 4 oz (97.183 kg)    Usual Body Weight: 208 lbs  % Usual Body Weight: 99%  BMI:  Body mass index is 27.93 kg/(m^2).  Estimated  Nutritional Needs: Kcal: 2000-2200 Protein: 150-160 g Fluid: 2.0-2.2 L/day  Skin: closed incisions on perineum and abdomen  Diet Order: Diet clear liquid  EDUCATION NEEDS: -Education needs addressed   Intake/Output Summary (Last 24 hours) at 09/12/14 1727 Last data filed at 09/12/14 1501  Gross per 24 hour  Intake 2242.08 ml  Output   4170 ml  Net -1927.92 ml    Last BM: prior to admission   Labs:   Recent Labs Lab 09/10/14 0350 09/11/14 0525 09/12/14 0428  NA 136 133* 135  K 4.4 4.1 3.9  CL 103 101 100  CO2 25 28 28   BUN 17 13 10   CREATININE 1.23 0.98 0.83  CALCIUM 8.0* 8.1* 8.3*  GLUCOSE 246* 168* 159*    CBG (last 3)  No results for input(s): GLUCAP in the last 72 hours.  Scheduled Meds: . [MAR Hold] sodium chloride   Intravenous Once  . antiseptic oral rinse  7 mL Mouth Rinse BID  . bisacodyl  10 mg Rectal Daily  . docusate sodium  100 mg Oral BID  . heparin subcutaneous  5,000 Units Subcutaneous 3 times per day  . HYDROmorphone PCA 0.3 mg/mL   Intravenous 6 times per day  . senna  1 tablet Oral BID    Continuous Infusions: . dextrose 5 % and 0.45 % NaCl with KCl 20 mEq/L 50 mL/hr at 09/12/14 1218    Past Medical  History  Diagnosis Date  . History of renal cell carcinoma     2003  S/P  PARTIAL RIGHT NEPHRECTOMY  . Hematuria   . Bladder disorder   . Urgency of urination   . Presence of surgical incision     lumbar diskectomy 05-11-2014-  bandage present  . At risk for sleep apnea     STOP-BANG= 4       SENT TO PCP 05-16-2014  . Cancer     Bladder    Past Surgical History  Procedure Laterality Date  . Evaluation under anesthesia with hemorrhoidectomy  07/07/2012    Procedure: EXAM UNDER ANESTHESIA WITH HEMORRHOIDECTOMY;  Surgeon: Joyice Faster. Cornett, MD;  Location: [facility];  Service: General;  Laterality: N/A;  . Partial nephrectomy Right 2003  . Lumbar microdiscectomy  05-11-2014    L4 -- L5 (partial)  . Cystoscopy with biopsy N/A  05/17/2014    Procedure: CYSTO WITH BLADDER BIOPSY;  Surgeon: Malka So, MD;  Location: [facility];  Service: Urology;  Laterality: N/A;  . Robot assisted laparoscopic complete cystect ileal conduit N/A 09/09/2014    Procedure: ROBOTIC ASSISTED LAPAROSCOPIC COMPLETE CYSTOPROSTATECTOMY,  ILEAL CONDUIT;  Surgeon: Alexis Frock, MD;  Location: [facility];  Service: Urology;  Laterality: N/A;  . Cystoscopy N/A 09/09/2014    Procedure: CYSTOSCOPY WITH INDOCYANINE GREEN DYE INJECTION AND URETHRAL DILATION;  Surgeon: Alexis Frock, MD;  Location: [facility];  Service: Urology;  Laterality: N/A;    Laurette Schimke MS, RD, LDN

## 2014-09-12 NOTE — Progress Notes (Signed)
3 Days Post-Op Subjective: Patient reports a relatively good pain control. Says the stool softeners taste "nasty".  He continues to have excellent urinary output via his conduit. No flatus/bowel movement. No severe nausea or emesis. Drinking water. Burping some.  He has ambulated in the hall. He has not slept well due to alarms going off in his room. The RNs report that the 900 charted under JP was actually conduit output. The stool output, is actually conduit output.  Objective: Vital signs in last 24 hours: Temp:  [98.2 F (36.8 C)-99.1 F (37.3 C)] 98.2 F (36.8 C) (01/11 0541) Pulse Rate:  [85-110] 85 (01/11 0541) Resp:  [14-19] 18 (01/11 0541) BP: (143-155)/(59-77) 143/66 mmHg (01/11 0541) SpO2:  [97 %-100 %] 99 % (01/11 0541)  Intake/Output from previous day: 01/10 0701 - 01/11 0700 In: 1722.1 [P.O.:120; I.V.:1602.1] Out: 4310 [Drains:910; BDZHG:9924] Intake/Output this shift:    Physical Exam:  Constitutional: Vital signs reviewed. WD WN in NAD   Eyes: PERRL, No scleral icterus.   Cardiovascular: RRR Pulmonary/Chest: Normal effort Abdominal: Soft. Mild diffuse tenderness. Slight distention. Stoma pink. Stents well-positioned. Genitourinary: No change Extremities: No cyanosis or edema   Lab Results:  Recent Labs  09/10/14 0350 09/11/14 0525 09/12/14 0428  HGB 10.7* 10.2* 9.9*  HCT 30.4* 29.0* 28.0*   BMET  Recent Labs  09/11/14 0525 09/12/14 0428  NA 133* 135  K 4.1 3.9  CL 101 100  CO2 28 28  GLUCOSE 168* 159*  BUN 13 10  CREATININE 0.98 0.83  CALCIUM 8.1* 8.3*   No results for input(s): LABPT, INR in the last 72 hours. No results for input(s): LABURIN in the last 72 hours. Results for orders placed or performed during the hospital encounter of 09/08/14  MRSA PCR Screening     Status: None   Collection Time: 09/08/14 10:08 PM  Result Value Ref Range Status   MRSA by PCR NEGATIVE NEGATIVE Final    Comment:        The GeneXpert MRSA Assay  (FDA approved for NASAL specimens only), is one component of a comprehensive MRSA colonization surveillance program. It is not intended to diagnose MRSA infection nor to guide or monitor treatment for MRSA infections.     Studies/Results: No results found.  Assessment/Plan:   Doing well postoperative day #3  Will continue with increased ambulation. Continue clear liquids and dilaudid PCA until ROBF.  Start heparin prophylaxis.  Routine postoperative care.   LOS: 4 days   Margo Aye 09/12/2014, 7:16 AM

## 2014-09-12 NOTE — Progress Notes (Signed)
Physical Therapy Treatment Patient Details Name: Danny Wood MRN: [id number] DOB: 1951-05-28 Today's Date: Oct 07, 2014    History of Present Illness 64 yo male adm 09/08/14 and is s/p ROBOTIC ASSISTED LAPAROSCOPIC COMPLETE CYSTOPROSTATECTOMY,  ILEAL CONDUIT; PMHx:  partial nephrectomy, lumbar discectomy    PT Comments    Pt ambulated in hallway with IV pole and increased flexion posture due to pain however also since after back surgery 3 months ago.  Follow Up Recommendations  No PT follow up     Equipment Recommendations  None recommended by PT    Recommendations for Other Services       Precautions / Restrictions Precautions Precautions: Fall    Mobility  Bed Mobility Overal bed mobility: Needs Assistance Bed Mobility: Supine to Sit Rolling: Supervision Sidelying to sit: Supervision;HOB elevated       General bed mobility comments: increased time due to pain however able to self assist, used rail  Transfers Overall transfer level: Needs assistance Equipment used: None Transfers: Sit to/from Stand Sit to Stand: Min guard         General transfer comment: verbal cues for technique  Ambulation/Gait Ambulation/Gait assistance: Min guard Ambulation Distance (Feet): 400 Feet Assistive device:  (pushed IV pole) Gait Pattern/deviations: Step-through pattern;Wide base of support;Decreased stride length     General Gait Details: increased trunk flexion due to pain as well as pt reports back surgery 3 months ago, encouraged posture as tolerated with 4-5 rest breaks for posture and trunk extension to midline posture   Stairs            Wheelchair Mobility    Modified Rankin (Stroke Patients Only)       Balance                                    Cognition Arousal/Alertness: Awake/alert Behavior During Therapy: WFL for tasks assessed/performed Overall Cognitive Status: Within Functional Limits for tasks assessed                      Exercises      General Comments        Pertinent Vitals/Pain Pain Assessment: 0-10 Pain Score: 5  Pain Location: operative site Pain Descriptors / Indicators: Burning Pain Intervention(s): Repositioned;Limited activity within patient's tolerance;Monitored during session;PCA encouraged    Home Living                      Prior Function            PT Goals (current goals can now be found in the care plan section) Progress towards PT goals: Progressing toward goals    Frequency  Min 3X/week    PT Plan Current plan remains appropriate    Co-evaluation             End of Session   Activity Tolerance: Patient tolerated treatment well Patient left: in bed;with call bell/phone within reach (sitting EOB per pt request)     Time: 1053-1106 PT Time Calculation (min) (ACUTE ONLY): 13 min  Charges:  $Gait Training: 8-22 mins                    G Codes:      Junious Ragone,KATHrine E 2014/10/07, 1:16 PM Carmelia Bake, PT, DPT October 07, 2014 Pager: [phone]

## 2014-09-13 LAB — CBC
HCT: 29.6 % — ABNORMAL LOW (ref 39.0–52.0)
Hemoglobin: 10.7 g/dL — ABNORMAL LOW (ref 13.0–17.0)
MCH: 30.9 pg (ref 26.0–34.0)
MCHC: 36.1 g/dL — ABNORMAL HIGH (ref 30.0–36.0)
MCV: 85.5 fL (ref 78.0–100.0)
Platelets: 183 10*3/uL (ref 150–400)
RBC: 3.46 MIL/uL — ABNORMAL LOW (ref 4.22–5.81)
RDW: 12.9 % (ref 11.5–15.5)
WBC: 6.4 10*3/uL (ref 4.0–10.5)

## 2014-09-13 LAB — BASIC METABOLIC PANEL
Anion gap: 8 (ref 5–15)
BUN: 11 mg/dL (ref 6–23)
CO2: 29 mmol/L (ref 19–32)
Calcium: 8.8 mg/dL (ref 8.4–10.5)
Chloride: 99 mEq/L (ref 96–112)
Creatinine, Ser: 0.81 mg/dL (ref 0.50–1.35)
GFR calc Af Amer: >90 mL/min (ref >90)
GFR calc non Af Amer: >90 mL/min (ref >90)
Glucose, Bld: 160 mg/dL — ABNORMAL HIGH (ref 70–99)
Potassium: 3.9 mmol/L (ref 3.5–5.1)
Sodium: 136 mmol/L (ref 135–145)

## 2014-09-13 MED ORDER — ACETAMINOPHEN 500 MG PO TABS
1000.0000 mg | ORAL_TABLET | Freq: Three times a day (TID) | ORAL | Status: DC
  Administered 2014-09-13 – 2014-09-15 (×7): 1000 mg via ORAL
  Filled 2014-09-13 (×10): qty 2

## 2014-09-13 NOTE — Consult Note (Addendum)
WOC ostomy consult note Stoma type/location: RLQ urostomy (ileal conduit) Stomal assessment/size: 1 and 1/4 inches round, edematous, moist; os at center.  Two stents intact, Red = Right ureter, Blue = Left Peristomal assessment: intact, clear Treatment options for stomal/peristomal skin: none indicated Output tea colored urine Ostomy pouching: 1pc. Convex pouching system, Lawson # [phone]  Education provided: Extended session for initial pouch change and instruction with patient.  He is uncomfortable with pouch removal and reluctant to view stoma.  He has not walked today, but is planning to when wife arrives after work at 5:30pm. Taught basic A&P of GI and GU tract, stoma and pouch characteristics.  Demonstrated pouch removal, stoma sizing, pouch application and provided an educational booklet for his review prior to my next visit tomorrow. Patient states he will not be reading as his pain has increased and his eyes do not yet feel quite right. Purpose of stents reviewed and an explanation that they will fall out on their own and what they will look like provided.  Demonstration of attachment to and disconnection from bedside drainage bag provided.  I will try to find a time that is mutually convenient for his wife to be present for a session prior to discharge. I have also introduced the notion of support from a [facility] for a couple of weeks post surgery for reinforcement of ostomy teaching and support while patient is mastering self care.  He will consider this. Next pouch change will be Thursday, January 14.  Supplies at bedside.   Patient registered with [facility]: Yes [facility] nursing team will follow, and will remain available to this patient, the nursing, surgical and medical teams.  . Thanks, Maudie Flakes, MSN, RN, [place], [place], [place] [phone])

## 2014-09-13 NOTE — Progress Notes (Signed)
4 Days Post-Op  Subjective:  1 - Muscle Invasive Bladder Cancer -  S/p robotic cystoprostatecotmy with ICG dye and bilateral template + sentinal lymphadenectomy and ileal conduit urinary diversion 09/09/2014. Path pending. Transferred to floor 09/10/14. On-Q pain cathter removed 09/12/14.   2 - Post-op Ileus- s/p bowel anastamosis as per above. Clears started 09/11/14.  3 - Disposition / Rehab - PT eval likely home w/o needs. Wound-osomty helping with new urostomy teaching.   Today " Danny Wood " is stable. Still having quite a bit of skin sensitivity / pain, but little deep abdominal / MSK pain. Not hitting his PCA out of fear of ileus. NO emesis despite some clear liquids. NO flatus / BM. Ambulating a few times. He reports that his back pain from his L5 fusion a few months ago is flaring up. He reports that the only thing that helped him at home was lying flat on the cold floor a few mins each day.  Objective: Vital signs in last 24 hours: Temp:  [97.8 F (36.6 C)-98.2 F (36.8 C)] 97.8 F (36.6 C) (01/12 0647) Pulse Rate:  [88-98] 94 (01/12 0647) Resp:  [10-16] 16 (01/12 0647) BP: (117-148)/(69-77) 117/77 mmHg (01/12 0647) SpO2:  [99 %-100 %] 99 % (01/12 0647) Last BM Date: 09/08/14  Intake/Output from previous day: 01/11 0701 - 01/12 0700 In: 1325 [P.O.:240; I.V.:1085] Out: 2900 [Drains:125; Stool:2775] Intake/Output this shift:    General appearance: alert, cooperative and appears stated age Eyes: negative Nose: Nares normal. Septum midline. Mucosa normal. No drainage or sinus tenderness., On [place] O2 per PCA req. Throat: lips, mucosa, and tongue normal; teeth and gums normal Neck: supple, symmetrical, trachea midline Back: symmetric, no curvature. ROM normal. No CVA tenderness. Resp: non-labored Cardio: Nl rate GI: soft, non-tender; bowel sounds normal; no masses,  no organomegaly Male genitalia: normal Extremities: extremities normal, atraumatic, no cyanosis or edema Pulses: 2+  and symmetric Skin: Skin color, texture, turgor normal. No rashes or lesions Lymph nodes: Cervical, supraclavicular, and axillary nodes normal. Neurologic: Grossly normal Incision/Wound: RLQ Urostomy pink / patent with bander stents in situ. Left JP with stable serosanguinous flud that is simple. All incision sites c/d/i.  Lab Results:   Recent Labs  09/12/14 0428 09/13/14 0518  WBC 6.3 6.4  HGB 9.9* 10.7*  HCT 28.0* 29.6*  PLT 147* 183   BMET  Recent Labs  09/12/14 0428 09/13/14 0518  NA 135 136  K 3.9 3.9  CL 100 99  CO2 28 29  GLUCOSE 159* 160*  BUN 10 11  CREATININE 0.83 0.81  CALCIUM 8.3* 8.8   PT/INR No results for input(s): LABPROT, INR in the last 72 hours. ABG No results for input(s): PHART, HCO3 in the last 72 hours.  Invalid input(s): PCO2, PO2  Studies/Results: No results found.  Anti-infectives: Anti-infectives    Start     Dose/Rate Route Frequency Ordered Stop   09/09/14 1800  piperacillin-tazobactam (ZOSYN) IVPB 3.375 g     3.375 g12.5 mL/hr over 240 Minutes Intravenous Every 8 hours 09/09/14 1748 09/11/14 1410   09/09/14 0600  piperacillin-tazobactam (ZOSYN) IVPB 3.375 g     3.375 g100 mL/hr over 30 Minutes Intravenous On call to O.R. 09/08/14 1329 09/09/14 0630      Assessment/Plan:  1 - Muscle Invasive Bladder Cancer -  Doing well POD 4. Path pending. Strongly encouraged ambulation. Remain PCA until tollerating reg diet. Encouraged use.  2 - Post-op Ileus - remain on clears until flatus / BM. Encouraged ambulation,  being upright, chewing gum. Daily ducolax SPP until BM.   3 - Disposition / Rehab - appreciate PT and ostomy RN help, final dispo pending. Remain in house.   Margo Aye 09/13/2014

## 2014-09-13 NOTE — Progress Notes (Signed)
PT Cancellation Note  Patient Details Name: Danny Wood MRN: [id number] DOB: [birth year]   Cancelled Treatment:     C/M ostomy change earlier then when checked back pt was resting and requested he walk later when his wife gets here after work.  Will check back as schedule permits.   Rica Koyanagi  PTA [facility]  Acute  Rehab Pager      [phone]

## 2014-09-13 NOTE — Progress Notes (Signed)
4 Days Post-Op  Subjective:  1 - Muscle Invasive Bladder Cancer -  S/p robotic cystoprostatecotmy with ICG dye and bilateral template + sentinal lymphadenectomy and ileal conduit urinary diversion 09/09/2014. Path pending. Transferred to floor 09/10/14. On-Q pain cathter removed 09/12/14.   2 - Post-op Ileus- s/p bowel anastamosis as per above. Clears started 09/11/14.  3 - Disposition / Rehab - PT eval likely home w/o needs. Wound-osomty helping with new urostomy teaching.   Today " Danny Wood " is stable. Still with skin sensitivity / pain and chronic back pain as primary bother. NO emesis. NO flatus / BM. Ambulating at least TID and tollerating clears.   Objective: Vital signs in last 24 hours: Temp:  [97.8 F (36.6 C)-98.2 F (36.8 C)] 97.8 F (36.6 C) (01/12 0647) Pulse Rate:  [88-98] 94 (01/12 0647) Resp:  [10-16] 16 (01/12 0647) BP: (117-148)/(69-77) 117/77 mmHg (01/12 0647) SpO2:  [99 %-100 %] 99 % (01/12 0647) Last BM Date: 09/08/14  Intake/Output from previous day: 01/11 0701 - 01/12 0700 In: 1325 [P.O.:240; I.V.:1085] Out: 2900 [Drains:125; Stool:2775] Intake/Output this shift:    General appearance: alert, cooperative and appears stated age Eyes: negative Nose: Nares normal. Septum midline. Mucosa normal. No drainage or sinus tenderness. Throat: lips, mucosa, and tongue normal; teeth and gums normal Neck: supple, symmetrical, trachea midline Back: symmetric, no curvature. ROM normal. No CVA tenderness. Resp: Non -labored on room air. [place] O2 / CO2 canula in place per PCA. Cardio: Nl rate GI: soft, non-tender; bowel sounds normal; no masses,  no organomegaly Male genitalia: normal Extremities: extremities normal, atraumatic, no cyanosis or edema Pulses: 2+ and symmetric Skin: Skin color, texture, turgor normal. No rashes or lesions Lymph nodes: Cervical, supraclavicular, and axillary nodes normal. Neurologic: Grossly normal Incision/Wound: Recent incision sites c/d/i. JP  with thin serosanguinous efflux. Urostomy pink / patent with very light pink urine with bilat Bander stents in situ.   Lab Results:   Recent Labs  09/12/14 0428 09/13/14 0518  WBC 6.3 6.4  HGB 9.9* 10.7*  HCT 28.0* 29.6*  PLT 147* 183   BMET  Recent Labs  09/12/14 0428 09/13/14 0518  NA 135 136  K 3.9 3.9  CL 100 99  CO2 28 29  GLUCOSE 159* 160*  BUN 10 11  CREATININE 0.83 0.81  CALCIUM 8.3* 8.8   PT/INR No results for input(s): LABPROT, INR in the last 72 hours. ABG No results for input(s): PHART, HCO3 in the last 72 hours.  Invalid input(s): PCO2, PO2  Studies/Results: No results found.  Anti-infectives: Anti-infectives    Start     Dose/Rate Route Frequency Ordered Stop   09/09/14 1800  piperacillin-tazobactam (ZOSYN) IVPB 3.375 g     3.375 g12.5 mL/hr over 240 Minutes Intravenous Every 8 hours 09/09/14 1748 09/11/14 1410   09/09/14 0600  piperacillin-tazobactam (ZOSYN) IVPB 3.375 g     3.375 g100 mL/hr over 30 Minutes Intravenous On call to O.R. 09/08/14 1329 09/09/14 0630      Assessment/Plan:  1 - Muscle Invasive Bladder Cancer -  Doing well POD 4. Path pending. Continue ambulation. Remain PCA until tollerating reg diet, restart PO tylenol scheduled as adjunct.   2 - Post-op Ileus - remain on clears until flatus / BM. Encouraged ambulation, being upright, chewing gum. Daily ducolax SPP until BM.   3 - Disposition / Rehab - appreciate PT and ostomy RN help, final dispo pending. Remain in house.   [facility], Danny Wood 09/13/2014

## 2014-09-14 LAB — CBC
HCT: 32.2 % — ABNORMAL LOW (ref 39.0–52.0)
Hemoglobin: 11.5 g/dL — ABNORMAL LOW (ref 13.0–17.0)
MCH: 30.7 pg (ref 26.0–34.0)
MCHC: 35.7 g/dL (ref 30.0–36.0)
MCV: 85.9 fL (ref 78.0–100.0)
Platelets: 232 10*3/uL (ref 150–400)
RBC: 3.75 MIL/uL — ABNORMAL LOW (ref 4.22–5.81)
RDW: 12.8 % (ref 11.5–15.5)
WBC: 6.9 10*3/uL (ref 4.0–10.5)

## 2014-09-14 LAB — BASIC METABOLIC PANEL
Anion gap: 10 (ref 5–15)
BUN: 18 mg/dL (ref 6–23)
CO2: 27 mmol/L (ref 19–32)
Calcium: 8.7 mg/dL (ref 8.4–10.5)
Chloride: 98 mEq/L (ref 96–112)
Creatinine, Ser: 0.93 mg/dL (ref 0.50–1.35)
GFR calc Af Amer: >90 mL/min (ref >90)
GFR calc non Af Amer: 88 mL/min — ABNORMAL LOW (ref >90)
Glucose, Bld: 165 mg/dL — ABNORMAL HIGH (ref 70–99)
Potassium: 3.9 mmol/L (ref 3.5–5.1)
Sodium: 135 mmol/L (ref 135–145)

## 2014-09-14 MED ORDER — HYDROMORPHONE HCL 1 MG/ML IJ SOLN
1.0000 mg | INTRAMUSCULAR | Status: DC | PRN
  Administered 2014-09-14 (×2): 1 mg via INTRAVENOUS
  Filled 2014-09-14 (×2): qty 1

## 2014-09-14 MED ORDER — OXYCODONE HCL 5 MG PO TABS
5.0000 mg | ORAL_TABLET | ORAL | Status: DC | PRN
  Administered 2014-09-14 – 2014-09-15 (×3): 5 mg via ORAL
  Filled 2014-09-14 (×3): qty 1

## 2014-09-14 NOTE — Progress Notes (Signed)
PT Cancellation Note  Patient Details Name: Danny Wood MRN: [id number] DOB: 1950-09-24   Cancelled Treatment:    Reason Eval/Treat Not Completed: Other (comment) (pt reports he just ambulated with NT, would like PT to check back again this week) Will check back as schedule permits.   Abie Cheek,KATHrine E 09/14/2014, 1:11 PM Carmelia Bake, PT, DPT 09/14/2014 Pager: [phone]

## 2014-09-14 NOTE — Consult Note (Signed)
WOC ostomy follow up Stoma type/location: RLQ urostomy Stomal assessment/size:  1 and 1/4 inches round, moist.  [place]ou[place] system applied yesterday is intact. Nursing staff instructed to disconnect patient from bedside urinary pouch this morning so that patient can begin to identify the pouch filling sensation.  He is to ambulate a few times today in the hallways and I think being disconnected from the bedside drainage bag will help in his mental recovery and move him closer to acceptance of the pouch.  I will see him again later today as at this time he is asking not to be disturbed until 11am. Enrolled patient in [place] [facility] Discharge program: [facility] nursing team will follow, and will remain available to this patient, the nursing and medical team.  Thanks, Maudie Flakes, MSN, RN, [place], [place], [place] [phone])

## 2014-09-14 NOTE — Progress Notes (Signed)
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 Transferred to floor 09/10/14. On-Q pain cathter removed 09/12/14.   2 - Post-op Ileus- s/p bowel anastamosis as per above. Clears started 09/11/14.  3 - Disposition / Rehab - PT eval likely home w/o needs. Wound-osomty helping with new urostomy teaching.   Today " Danny Wood " is having some abdominal rumbling but still no flatus, one episode thin non-bilious emesis yesterday.   Objective: Vital signs in last 24 hours: Temp:  [98 F (36.7 C)-98.4 F (36.9 C)] 98.4 F (36.9 C) (01/13 0636) Pulse Rate:  [91-101] 98 (01/13 0636) Resp:  [18-20] 20 (01/13 0636) BP: (125-154)/(77-84) 154/84 mmHg (01/13 0636) SpO2:  [96 %-100 %] 97 % (01/13 0636) Last BM Date: 09/08/14  Intake/Output from previous day: 01/12 0701 - 01/13 0700 In: 1794.2 [P.O.:480; I.V.:1314.2] Out: 870 [Drains:70; Stool:800] Intake/Output this shift:    General appearance: alert, cooperative and appears stated age Head: Normocephalic, without obvious abnormality, atraumatic Nose: Nares normal. Septum midline. Mucosa normal. No drainage or sinus tenderness. Throat: lips, mucosa, and tongue normal; teeth and gums normal Neck: supple, symmetrical, trachea midline Back: symmetric, no curvature. ROM normal. No CVA tenderness. Resp: clear to auscultation bilaterally and On nasal O2 monitor as on PCA Cardio: regular rate and rhythm, S1, S2 normal, no murmur, click, rub or gallop GI: soft, non-tender; bowel sounds normal; no masses,  no organomegaly Male genitalia: normal Extremities: extremities normal, atraumatic, no cyanosis or edema Pulses: 2+ and symmetric Skin: Skin color, texture, turgor normal. No rashes or lesions Lymph nodes: Cervical, supraclavicular, and  axillary nodes normal. Neurologic: Grossly normal  RLQ Urostomy pink / patent with bander stents in situ with clear urine. Incision sites c/d/i. No hernias / drainage / erythema / distension.  Lab Results:   Recent Labs  09/13/14 0518 09/14/14 0408  WBC 6.4 6.9  HGB 10.7* 11.5*  HCT 29.6* 32.2*  PLT 183 232   BMET  Recent Labs  09/13/14 0518 09/14/14 0408  NA 136 135  K 3.9 3.9  CL 99 98  CO2 29 27  GLUCOSE 160* 165*  BUN 11 18  CREATININE 0.81 0.93  CALCIUM 8.8 8.7   PT/INR No results for input(s): LABPROT, INR in the last 72 hours. ABG No results for input(s): PHART, HCO3 in the last 72 hours.  Invalid input(s): PCO2, PO2  Studies/Results: No results found.  Anti-infectives: Anti-infectives    Start     Dose/Rate Route Frequency Ordered Stop   09/09/14 1800  piperacillin-tazobactam (ZOSYN) IVPB 3.375 g     3.375 g12.5 mL/hr over 240 Minutes Intravenous Every 8 hours 09/09/14 1748 09/11/14 1410   09/09/14 0600  piperacillin-tazobactam (ZOSYN) IVPB 3.375 g     3.375 g100 mL/hr over 30 Minutes Intravenous On call to O.R. 09/08/14 1329 09/09/14 0630      1 - Muscle Invasive Bladder Cancer -  Doing well POD 5, path discussed in detail including overall good prognosis with margin negative node negative disease. Remain PCA until tollerating reg diet with PO tylenol scheduled as adjunct.   2 - Post-op Ileus - remain on clears with supplement shakes until flatus / BM. Encouraged ambulation, being upright, chewing gum. Daily ducolax SPP until BM. No rush for IV nutrition / PICC as I feel his liq PO intake is safer  approach for now as he is keeping most of it down.   3 - Disposition / Rehab - appreciate PT and ostomy RN help, final dispo pending. Remain in house.   Danny Wood, Danny Wood 09/13/2014   Danny Wood, Danny Wood 09/14/2014

## 2014-09-15 ENCOUNTER — Encounter (HOSPITAL_COMMUNITY): Payer: Self-pay | Admitting: Urology

## 2014-09-15 MED ORDER — OXYCODONE-ACETAMINOPHEN 5-325 MG PO TABS: 1.0000 | ORAL_TABLET | Freq: Four times a day (QID) | ORAL | Status: DC | PRN

## 2014-09-15 NOTE — Discharge Summary (Signed)
Physician Discharge Summary  Patient ID: Danny Wood MRN: [id number] DOB/AGE: 1951/07/31 64 y.o.  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 Transferred to floor 09/10/14. On-Q pain cathter removed 09/12/14.  2 - Post-op Ileus- s/p bowel anastamosis as per above. Clears started 09/11/14. Complete return of bowel function 1/13. By time of discharge tollerating regular diet w/o IV supplementation x 24 hours.   3 - Disposition / Rehab - PT helped with retrun to ambulation in house. Initial wound ostomy teaching in house that will be continued as outpatient with [facility] (arragements pending)   Consults: PT, ostomy RN  Significant Diagnostic Studies: labs: as per abveo.   Treatments: surgery: robotic cystoprostatecotmy with ICG dye and bilateral template + sentinal lymphadenectomy and ileal conduit urinary diversion 09/09/2014   Discharge Exam: Blood pressure 150/74, pulse 82, temperature 98.3 F (36.8 C), temperature source Oral, resp. rate 18, height 6' (1.829 m), weight 93.441 kg (206 lb), SpO2 99 %. General appearance: alert, cooperative, appears stated age and wife at bedside Head: Normocephalic, without obvious abnormality, atraumatic Nose: Nares normal. Septum midline. Mucosa normal. No drainage or sinus tenderness. Throat: lips, mucosa, and tongue normal; teeth and gums normal Neck: supple, symmetrical, trachea midline Back: symmetric, no curvature. ROM normal. No CVA tenderness. Resp: non-labored on room air.  Cardio: Nl rate GI: soft, non-tender; bowel sounds normal; no masses,  no  organomegaly Male genitalia: normal Extremities: extremities normal, atraumatic, no cyanosis or edema Pulses: 2+ and symmetric Skin: Skin color, texture, turgor normal. No rashes or lesions Lymph nodes: Cervical, supraclavicular, and axillary nodes normal. Neurologic: Grossly normal Incision/Wound: Recent incision / port sites c/d/i. New RLQ Urostomy c/d/i with Bander stents in situ.   Disposition: 01-Home or Self Care     Medication List    ASK your doctor about these medications        ondansetron 8 MG tablet  Commonly known as:  ZOFRAN  Take 1 tablet (8 mg total) by mouth every 8 (eight) hours as needed for nausea or vomiting.     oxyCODONE 5 MG immediate release tablet  Commonly known as:  Oxy IR/ROXICODONE  Take 5 mg by mouth 4 (four) times daily as needed for severe pain (pt prefers to just take one a day unless needs more for pain).     oxyCODONE-acetaminophen 5-325 MG per tablet  Commonly known as:  PERCOCET  Take 1 tablet by mouth every 4 (four) hours as needed for severe pain.     phenazopyridine 200 MG tablet  Commonly known as:  PYRIDIUM  Take 1 tablet (200 mg total) by mouth 3 (three) times daily as needed for pain.     prochlorperazine 10 MG tablet  Commonly known as:  COMPAZINE  Take 1 tablet (10 mg total) by mouth every 6 (six) hours as needed for nausea or vomiting.         SignedAlexis Frock 09/15/2014, 4:34 PM

## 2014-09-15 NOTE — Progress Notes (Signed)
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 Transferred to floor 09/10/14. On-Q pain cathter removed 09/12/14.   2 - Post-op Ileus- s/p bowel anastamosis as per above. Clears started 09/11/14. Full liquid 1/12. +BM/flatus on 1/13.  3 - Disposition / Rehab - PT eval likely home w/o needs. Wound-osomty helping with new urostomy teaching.   Today " Stellan " had a BM last night. His pain is well controlled with 2 doses of oxycodone. He tolerated jello and pudding yesterday without nausea. He is ambulating. He is comfortable with his urostomy bag changes.  Objective: Vital signs in last 24 hours: Temp:  [98 F (36.7 C)-98.4 F (36.9 C)] 98.1 F (36.7 C) (01/13 2149) Pulse Rate:  [87-98] 95 (01/13 2149) Resp:  [16-20] 18 (01/13 2149) BP: (136-154)/(78-84) 136/78 mmHg (01/13 2149) SpO2:  [95 %-100 %] 97 % (01/13 2149) Last BM Date: 09/14/14  Intake/Output from previous day: 01/13 0701 - 01/14 0700 In: 120 [P.O.:120] Out: 630 [Drains:130; Stool:500] Intake/Output this shift: Total I/O In: 120 [P.O.:120] Out: 30 [Drains:30]  General appearance: alert, cooperative and appears stated age Head: Normocephalic, without obvious abnormality, atraumatic Nose: Nares normal. Septum midline. Mucosa normal. No drainage or sinus tenderness. Throat: lips, mucosa, and tongue normal; teeth and gums normal Neck: supple, symmetrical, trachea midline Back: symmetric, no curvature. ROM normal. No CVA tenderness. Resp: clear to auscultation bilaterally and On nasal O2 monitor as on PCA Cardio: regular rate and rhythm, S1, S2 normal, no murmur, click, rub or gallop GI: soft, non-tender; bowel sounds normal; no masses,  no organomegaly Male genitalia: normal Extremities: extremities normal,  atraumatic, no cyanosis or edema Pulses: 2+ and symmetric Skin: Skin color, texture, turgor normal. No rashes or lesions Lymph nodes: Cervical, supraclavicular, and axillary nodes normal. Neurologic: Grossly normal  RLQ Urostomy pink / patent with bander stents in situ with clear urine. Incision sites c/d/i. No hernias / drainage / erythema / distension.  Lab Results:   Recent Labs  09/13/14 0518 09/14/14 0408  WBC 6.4 6.9  HGB 10.7* 11.5*  HCT 29.6* 32.2*  PLT 183 232   BMET  Recent Labs  09/13/14 0518 09/14/14 0408  NA 136 135  K 3.9 3.9  CL 99 98  CO2 29 27  GLUCOSE 160* 165*  BUN 11 18  CREATININE 0.81 0.93  CALCIUM 8.8 8.7   PT/INR No results for input(s): LABPROT, INR in the last 72 hours. ABG No results for input(s): PHART, HCO3 in the last 72 hours.  Invalid input(s): PCO2, PO2  Studies/Results: No results found.  Anti-infectives: Anti-infectives    Start     Dose/Rate Route Frequency Ordered Stop   09/09/14 1800  piperacillin-tazobactam (ZOSYN) IVPB 3.375 g     3.375 g12.5 mL/hr over 240 Minutes Intravenous Every 8 hours 09/09/14 1748 09/11/14 1410   09/09/14 0600  piperacillin-tazobactam (ZOSYN) IVPB 3.375 g     3.375 g100 mL/hr over 30 Minutes Intravenous On call to O.R. 09/08/14 1329 09/09/14 0630      1 - Muscle Invasive Bladder Cancer -  Doing well POD 5, path discussed in detail including overall good prognosis with margin negative node negative disease. PO pain meds.  2 - Post-op Ileus - resolved. Reg diet today. Encouraged ambulation, being upright, chewing gum.   3 - Disposition / Rehab -  appreciate PT and ostomy RN help, final dispo pending. Remain in house. If does well if breakfast/lunch, patient desires discharge later today.   Margo Aye 09/15/2014

## 2014-09-15 NOTE — Consult Note (Signed)
WOC ostomy follow up Stoma type/location: RLQ urostomy Stomal assessment/size: 1 and 1/4 inches round, red, edematous, moist with 2 stents intact. Peristomal assessment: intact, clear Treatment options for stomal/peristomal skin: none indicated Output tea colored urine Ostomy pouching: 1pc.convex pouching system  Education provided: Patient is not willing to look at stoma, winces when I remove pouch and also when I touch stoma.  Is very anxious of urostomy and states that wife "will care for it" at home.  I note that I have had no teaching sessions with his wife and discuss home health services as an option as he is learning self care.  Wife enters the room at exactly that moment and patient expresses great relief.  Patient's wife shown how stoma is sized, how pouch is prepared (tracing pattern and cutting out skin barrier), and applied.  Patient is independent in ostomy pouch emptying.  Instructed on pouch wear time and it is recommended that they change pouch twice weekly in the early phases at home until they determine his usual wear time. Wife states she will purchase underpads for their bed in the event that patient leaks and "gets everything wet". While patient is medically stable, I do have concerns that he is not adjusting well to the ostomy and that his wife has underestimated the importance of ostomy instruction.  Dr. Tresa Moore in at the end of our visit and agrees with the recommendation for home care nurses to reinforce instruction.  The patient and his wife select [facility] and the Case Manager has made a referral.  Patient is anxious to return to work and states that he would like to be working again in the next week.  I encourage him to discuss that with Dr. Tresa Moore in his first follow up appointment and his agrees to do that.  Patient states that he did not wish to read the patient education booklet and that he sent it home with his wife the other day.   Enrolled patient in [employer]  Discharge program: Yes Patient is discharging this evening. Thanks, Maudie Flakes, MSN, RN, [place], [place], [place] [phone])

## 2014-09-15 NOTE — Discharge Instructions (Signed)

## 2014-09-15 NOTE — Progress Notes (Signed)
Patient want encouraged to ambulate; patient has not ambulated today

## 2014-10-06 ENCOUNTER — Other Ambulatory Visit: Payer: BC Managed Care – PPO

## 2014-10-06 ENCOUNTER — Ambulatory Visit: Payer: BC Managed Care – PPO | Admitting: Oncology

## 2015-05-12 ENCOUNTER — Ambulatory Visit (HOSPITAL_COMMUNITY)
Admission: RE | Admit: 2015-05-12 | Discharge: 2015-05-12 | Disposition: A | Discharge status: Home / Self Care | Payer: Commercial Managed Care - HMO | Source: Ambulatory Visit | Attending: Urology | Admitting: Urology

## 2015-05-12 ENCOUNTER — Other Ambulatory Visit: Payer: Self-pay | Admitting: Urology

## 2015-05-12 DIAGNOSIS — Z87891 Personal history of nicotine dependence: Secondary | ICD-10-CM | POA: Diagnosis not present

## 2015-05-12 DIAGNOSIS — C679 Malignant neoplasm of bladder, unspecified: Secondary | ICD-10-CM | POA: Insufficient documentation

## 2015-11-09 ENCOUNTER — Other Ambulatory Visit: Payer: Self-pay | Admitting: Pain Medicine

## 2015-11-09 DIAGNOSIS — M545 Low back pain: Secondary | ICD-10-CM

## 2015-11-27 ENCOUNTER — Other Ambulatory Visit: Payer: Self-pay | Admitting: Urology

## 2015-11-27 ENCOUNTER — Ambulatory Visit (HOSPITAL_COMMUNITY)
Admission: RE | Admit: 2015-11-27 | Discharge: 2015-11-27 | Disposition: A | Discharge status: Home / Self Care | Payer: Commercial Managed Care - HMO | Source: Ambulatory Visit | Attending: Urology | Admitting: Urology

## 2015-11-27 DIAGNOSIS — C679 Malignant neoplasm of bladder, unspecified: Secondary | ICD-10-CM | POA: Diagnosis not present

## 2015-12-01 ENCOUNTER — Ambulatory Visit
Admission: RE | Admit: 2015-12-01 | Discharge: 2015-12-01 | Disposition: A | Discharge status: Home / Self Care | Payer: Commercial Managed Care - HMO | Source: Ambulatory Visit | Attending: Pain Medicine | Admitting: Pain Medicine

## 2015-12-01 DIAGNOSIS — M545 Low back pain: Secondary | ICD-10-CM

## 2016-09-21 ENCOUNTER — Other Ambulatory Visit: Payer: Self-pay | Admitting: Nurse Practitioner

## 2017-10-01 DIAGNOSIS — R05 Cough: Secondary | ICD-10-CM | POA: Diagnosis not present

## 2017-10-01 DIAGNOSIS — J029 Acute pharyngitis, unspecified: Secondary | ICD-10-CM | POA: Diagnosis not present

## 2017-12-15 DIAGNOSIS — B356 Tinea cruris: Secondary | ICD-10-CM | POA: Diagnosis not present

## 2017-12-30 DIAGNOSIS — C678 Malignant neoplasm of overlapping sites of bladder: Secondary | ICD-10-CM | POA: Diagnosis not present

## 2018-01-01 ENCOUNTER — Ambulatory Visit (HOSPITAL_COMMUNITY)
Admission: RE | Admit: 2018-01-01 | Discharge: 2018-01-01 | Disposition: A | Discharge status: Home / Self Care | Payer: 59 | Source: Ambulatory Visit | Attending: Urology | Admitting: Urology

## 2018-01-01 ENCOUNTER — Other Ambulatory Visit: Payer: Self-pay | Admitting: Urology

## 2018-01-01 DIAGNOSIS — C679 Malignant neoplasm of bladder, unspecified: Secondary | ICD-10-CM | POA: Diagnosis not present

## 2018-01-01 DIAGNOSIS — C801 Malignant (primary) neoplasm, unspecified: Secondary | ICD-10-CM

## 2018-01-01 DIAGNOSIS — C678 Malignant neoplasm of overlapping sites of bladder: Secondary | ICD-10-CM | POA: Insufficient documentation

## 2018-01-01 DIAGNOSIS — Z85528 Personal history of other malignant neoplasm of kidney: Secondary | ICD-10-CM | POA: Insufficient documentation

## 2018-01-05 ENCOUNTER — Other Ambulatory Visit: Payer: Self-pay | Admitting: Urology

## 2018-01-05 DIAGNOSIS — R591 Generalized enlarged lymph nodes: Secondary | ICD-10-CM

## 2018-01-12 ENCOUNTER — Other Ambulatory Visit: Payer: Self-pay | Admitting: Radiology

## 2018-01-14 ENCOUNTER — Ambulatory Visit (HOSPITAL_COMMUNITY)
Admission: RE | Admit: 2018-01-14 | Discharge: 2018-01-14 | Disposition: A | Discharge status: Home / Self Care | Payer: 59 | Source: Ambulatory Visit | Attending: Urology | Admitting: Urology

## 2018-01-14 ENCOUNTER — Other Ambulatory Visit: Payer: Self-pay | Admitting: Urology

## 2018-01-14 ENCOUNTER — Encounter (HOSPITAL_COMMUNITY): Payer: Self-pay

## 2018-01-14 DIAGNOSIS — C7989 Secondary malignant neoplasm of other specified sites: Secondary | ICD-10-CM | POA: Insufficient documentation

## 2018-01-14 DIAGNOSIS — C649 Malignant neoplasm of unspecified kidney, except renal pelvis: Secondary | ICD-10-CM | POA: Insufficient documentation

## 2018-01-14 DIAGNOSIS — C689 Malignant neoplasm of urinary organ, unspecified: Secondary | ICD-10-CM | POA: Diagnosis not present

## 2018-01-14 DIAGNOSIS — C679 Malignant neoplasm of bladder, unspecified: Secondary | ICD-10-CM | POA: Diagnosis not present

## 2018-01-14 DIAGNOSIS — C774 Secondary and unspecified malignant neoplasm of inguinal and lower limb lymph nodes: Secondary | ICD-10-CM | POA: Diagnosis not present

## 2018-01-14 DIAGNOSIS — R591 Generalized enlarged lymph nodes: Secondary | ICD-10-CM | POA: Diagnosis present

## 2018-01-14 DIAGNOSIS — R590 Localized enlarged lymph nodes: Secondary | ICD-10-CM | POA: Diagnosis not present

## 2018-01-14 LAB — CBC WITH DIFFERENTIAL/PLATELET
Basophils Absolute: 0 10*3/uL (ref 0.0–0.1)
Basophils Relative: 0 %
Eosinophils Absolute: 0.1 10*3/uL (ref 0.0–0.7)
Eosinophils Relative: 1 %
HCT: 44.3 % (ref 39.0–52.0)
Hemoglobin: 15.9 g/dL (ref 13.0–17.0)
Lymphocytes Relative: 24 %
Lymphs Abs: 1.9 10*3/uL (ref 0.7–4.0)
MCH: 29.9 pg (ref 26.0–34.0)
MCHC: 35.9 g/dL (ref 30.0–36.0)
MCV: 83.4 fL (ref 78.0–100.0)
Monocytes Absolute: 0.9 10*3/uL (ref 0.1–1.0)
Monocytes Relative: 11 %
Neutro Abs: 4.9 10*3/uL (ref 1.7–7.7)
Neutrophils Relative %: 64 %
Platelets: 226 10*3/uL (ref 150–400)
RBC: 5.31 MIL/uL (ref 4.22–5.81)
RDW: 12.3 % (ref 11.5–15.5)
WBC: 7.7 10*3/uL (ref 4.0–10.5)

## 2018-01-14 LAB — BASIC METABOLIC PANEL
Anion gap: 11 (ref 5–15)
BUN: 15 mg/dL (ref 6–20)
CO2: 24 mmol/L (ref 22–32)
Calcium: 9.1 mg/dL (ref 8.9–10.3)
Chloride: 104 mmol/L (ref 101–111)
Creatinine, Ser: 1.03 mg/dL (ref 0.61–1.24)
GFR calc Af Amer: >60 mL/min (ref >60)
GFR calc non Af Amer: >60 mL/min (ref >60)
Glucose, Bld: 144 mg/dL — ABNORMAL HIGH (ref 65–99)
Potassium: 4.1 mmol/L (ref 3.5–5.1)
Sodium: 139 mmol/L (ref 135–145)

## 2018-01-14 LAB — PROTIME-INR
INR: 0.95
Prothrombin Time: 12.5 seconds (ref 11.4–15.2)

## 2018-01-14 MED ORDER — MIDAZOLAM HCL 2 MG/2ML IJ SOLN
INTRAMUSCULAR | Status: AC
  Filled 2018-01-14: qty 4

## 2018-01-14 MED ORDER — LIDOCAINE HCL 1 % IJ SOLN
INTRAMUSCULAR | Status: AC
  Filled 2018-01-14: qty 20

## 2018-01-14 MED ORDER — FENTANYL CITRATE (PF) 100 MCG/2ML IJ SOLN
INTRAMUSCULAR | Status: AC | PRN
  Administered 2018-01-14 (×2): 50 ug via INTRAVENOUS

## 2018-01-14 MED ORDER — FENTANYL CITRATE (PF) 100 MCG/2ML IJ SOLN
INTRAMUSCULAR | Status: AC
  Filled 2018-01-14: qty 2

## 2018-01-14 MED ORDER — SODIUM CHLORIDE 0.9 % IV SOLN
INTRAVENOUS | Status: DC
  Administered 2018-01-14: 11:00:00 via INTRAVENOUS

## 2018-01-14 MED ORDER — MIDAZOLAM HCL 2 MG/2ML IJ SOLN
INTRAMUSCULAR | Status: AC | PRN
  Administered 2018-01-14: 2 mg via INTRAVENOUS
  Administered 2018-01-14 (×2): 1 mg via INTRAVENOUS

## 2018-01-14 MED ORDER — LIDOCAINE HCL (PF) 1 % IJ SOLN
INTRAMUSCULAR | Status: AC | PRN
  Administered 2018-01-14: 5 mL

## 2018-01-14 MED ORDER — HYDROCODONE-ACETAMINOPHEN 5-325 MG PO TABS: 1.0000 | ORAL_TABLET | ORAL | Status: DC | PRN

## 2018-01-14 NOTE — H&P (Signed)
Chief Complaint: Patient was seen in consultation today for lymphadenopathy.   Referring Physician(s): Manny,Theodore  Supervising Physician: Arne Cleveland  Patient Status: [facility] - Out-pt  History of Present Illness: Danny Wood is a 67 y.o. male with past medical history of bladder cancer s/p resection and right RCC s/p partial resection who presents with lymphadenopathy.   CT Abdomen Pelvis 01/01/18 showed: Stable post op changes from cystoprostatectomy and partial right nephrectomy, without evidence of recurrent carcinoma these sites.  New mild left paraaortic, bilateral external iliac, and right inguinal lymphadenopathy, consistent with lymph node metastasis. No radiographic evidence of visceral or bone metastasis.  IR consulted for lymph node biopsy at the request of Dr. Tresa Moore.  Case reviewed and approved by Dr. Vernard Gambles.   Patient has been NPO.  He does not take blood thinners.   Past Medical History:  Diagnosis Date  . At risk for sleep apnea    STOP-BANG= 4       SENT TO PCP 05-16-2014  . Bladder disorder   . Cancer [facility])    Bladder  . Hematuria   . History of renal cell carcinoma    2003  S/P  PARTIAL RIGHT NEPHRECTOMY  . Presence of surgical incision    lumbar diskectomy 05-11-2014-  bandage present  . Urgency of urination     Past Surgical History:  Procedure Laterality Date  . CYSTOSCOPY N/A 09/09/2014   Procedure: CYSTOSCOPY WITH INDOCYANINE GREEN DYE INJECTION AND URETHRAL DILATION;  Surgeon: Alexis Frock, MD;  Location: [facility];  Service: Urology;  Laterality: N/A;  . CYSTOSCOPY WITH BIOPSY N/A 05/17/2014   Procedure: CYSTO WITH BLADDER BIOPSY;  Surgeon: Malka So, MD;  Location: [facility]  Service: Urology;  Laterality: N/A;  . EVALUATION UNDER ANESTHESIA WITH HEMORRHOIDECTOMY  07/07/2012   Procedure: EXAM UNDER ANESTHESIA WITH HEMORRHOIDECTOMY;  Surgeon: Joyice Faster. Cornett, MD;  Location: [facility];  Service: General;   Laterality: N/A;  . LUMBAR MICRODISCECTOMY  05-11-2014   L4 -- L5 (partial)  . PARTIAL NEPHRECTOMY Right 2003  . ROBOT ASSISTED LAPAROSCOPIC COMPLETE CYSTECT ILEAL CONDUIT N/A 09/09/2014   Procedure: ROBOTIC ASSISTED LAPAROSCOPIC COMPLETE CYSTOPROSTATECTOMY,  ILEAL CONDUIT;  Surgeon: Alexis Frock, MD;  Location: [facility];  Service: Urology;  Laterality: N/A;    Allergies: Patient has no known allergies.  Medications: Prior to Admission medications   Medication Sig Start Date End Date Taking? Authorizing Provider  oxyCODONE-acetaminophen (PERCOCET) 5-325 MG per tablet Take 1-2 tablets by mouth every 6 (six) hours as needed for moderate pain or severe pain. Post-operatively 09/15/14  Yes Alexis Frock, MD  ondansetron (ZOFRAN) 8 MG tablet Take 1 tablet (8 mg total) by mouth every 8 (eight) hours as needed for nausea or vomiting. Patient not taking: Reported on 07/29/2014 06/01/14   Wyatt Portela, MD  prochlorperazine (COMPAZINE) 10 MG tablet Take 1 tablet (10 mg total) by mouth every 6 (six) hours as needed for nausea or vomiting. Patient not taking: Reported on 07/29/2014 06/17/14   Wyatt Portela, MD     History reviewed. No pertinent family history.  Social History   Socioeconomic History  . Marital status: Married    Spouse name: Not on file  . Number of children: Not on file  . Years of education: Not on file  . Highest education level: Not on file  Occupational History  . Not on file  Social Needs  . Financial resource strain: Not on file  . Food insecurity:  Worry: Not on file    Inability: Not on file  . Transportation needs:    Medical: Not on file    Non-medical: Not on file  Tobacco Use  . Smoking status: Former Smoker    Packs/day: 0.50    Years: 25.00    Pack years: 12.50    Types: Cigarettes    Last attempt to quit: 06/02/2004    Years since quitting: 13.6  . Smokeless tobacco: Never Used  Substance and Sexual Activity  . Alcohol use: Yes     Alcohol/week: 1.0 oz    Types: 2 Standard drinks or equivalent per week  . Drug use: No  . Sexual activity: Not on file  Lifestyle  . Physical activity:    Days per week: Not on file    Minutes per session: Not on file  . Stress: Not on file  Relationships  . Social connections:    Talks on phone: Not on file    Gets together: Not on file    Attends religious service: Not on file    Active member of club or organization: Not on file    Attends meetings of clubs or organizations: Not on file    Relationship status: Not on file  Other Topics Concern  . Not on file  Social History Narrative  . Not on file     Review of Systems: A 12 point ROS discussed and pertinent positives are indicated in the HPI above.  All other systems are negative.  Review of Systems  Constitutional: Negative for fatigue and fever.  Respiratory: Negative for cough and shortness of breath.   Cardiovascular: Negative for chest pain.  Gastrointestinal: Negative for abdominal pain.  Musculoskeletal: Negative for back pain.  Psychiatric/Behavioral: Negative for behavioral problems and confusion.    Vital Signs: BP (!) 162/80 (BP Location: Right Arm)   Pulse 66   Temp 98.2 F (36.8 C) (Oral)   Resp 16   Ht 6' (1.829 m)   Wt 219 lb (99.3 kg)   SpO2 98%   BMI 29.70 kg/m   Physical Exam  Constitutional: He is oriented to person, place, and time. He appears well-developed. No distress.  Cardiovascular: Normal rate, regular rhythm and normal heart sounds.  Pulmonary/Chest: Effort normal and breath sounds normal.  Abdominal: Soft. Bowel sounds are normal. He exhibits no distension.  Neurological: He is alert and oriented to person, place, and time.  Skin: Skin is warm and dry.  Palpable, enlarged inguinal lymph node chain.   Psychiatric: He has a normal mood and affect. His behavior is normal. Judgment and thought content normal.  Nursing note and vitals reviewed.    MD Evaluation Airway:  WNL Heart: WNL Abdomen: WNL Chest/ Lungs: WNL ASA  Classification: 3 Mallampati/Airway Score: One   Imaging: Dg Chest 2 View  Result Date: 01/02/2018 CLINICAL DATA:  Follow-up bladder cancer. EXAM: CHEST - 2 VIEW COMPARISON:  Radiographs of November 27, 2015. FINDINGS: The heart size and mediastinal contours are within normal limits. Both lungs are clear. No pneumothorax or pleural effusion is noted. The visualized skeletal structures are unremarkable. IMPRESSION: No active cardiopulmonary disease. Electronically Signed   By: Marijo Conception, M.D.   On: 01/02/2018 08:45    Labs:  CBC: Recent Labs    01/14/18 1117  WBC 7.7  HGB 15.9  HCT 44.3  PLT 226    COAGS: Recent Labs    01/14/18 1117  INR 0.95    BMP: Recent Labs  01/14/18 1117  NA 139  K 4.1  CL 104  CO2 24  GLUCOSE 144*  BUN 15  CALCIUM 9.1  CREATININE 1.03  GFRNONAA >60  GFRAA >60    LIVER FUNCTION TESTS: No results for input(s): BILITOT, AST, ALT, ALKPHOS, PROT, ALBUMIN in the last 8760 hours.  TUMOR MARKERS: No results for input(s): AFPTM, CEA, CA199, CHROMGRNA in the last 8760 hours.  Assessment and Plan: Patient with past medical history of bladder and renal cell carcinoma presents with complaint of inguinal lymphadenopathy.  IR consulted for lymph node biopsy at the request of Dr. Tresa Moore. Case reviewed by Dr. Vernard Gambles who approves patient for procedure.  Patient presents today in their usual state of health.  He has been NPO and is not currently on blood thinners.   Risks and benefits discussed with the patient including, but not limited to bleeding, infection, damage to adjacent structures or low yield requiring additional tests.  All of the patient's questions were answered, patient is agreeable to proceed. Consent signed and in chart.  Thank you for this interesting consult.  I greatly enjoyed meeting Danny Wood and look forward to participating in their care.  A copy of this  report was sent to the requesting provider on this date.  Electronically Signed: Docia Barrier, PA 01/14/2018, 1:07 PM   I spent a total of  30 Minutes   in face to face in clinical consultation, greater than 50% of which was counseling/coordinating care for lymphadenopathy.

## 2018-01-14 NOTE — Procedures (Signed)
  Procedure: [place] core R inguinal adenopathy 18g x4 EBL:   minimal Complications:  none immediate  See full dictation in [facility].  Dillard Cannon MD Main # [phone] Pager  [phone]

## 2018-01-14 NOTE — Discharge Instructions (Signed)

## 2018-01-20 DIAGNOSIS — Z932 Ileostomy status: Secondary | ICD-10-CM | POA: Diagnosis not present

## 2018-01-20 DIAGNOSIS — R591 Generalized enlarged lymph nodes: Secondary | ICD-10-CM | POA: Diagnosis not present

## 2018-01-20 DIAGNOSIS — C678 Malignant neoplasm of overlapping sites of bladder: Secondary | ICD-10-CM | POA: Diagnosis not present

## 2018-01-22 ENCOUNTER — Telehealth: Payer: Self-pay | Admitting: Oncology

## 2018-01-22 NOTE — Telephone Encounter (Signed)
Scheduled appt per 5/23 sch msg - left vm for pt re appts.

## 2018-01-28 ENCOUNTER — Telehealth: Payer: Self-pay | Admitting: Oncology

## 2018-01-28 ENCOUNTER — Inpatient Hospital Stay: Payer: 59 | Attending: Oncology | Admitting: Oncology

## 2018-01-28 DIAGNOSIS — Z9221 Personal history of antineoplastic chemotherapy: Secondary | ICD-10-CM | POA: Diagnosis not present

## 2018-01-28 DIAGNOSIS — C679 Malignant neoplasm of bladder, unspecified: Secondary | ICD-10-CM | POA: Insufficient documentation

## 2018-01-28 DIAGNOSIS — Z906 Acquired absence of other parts of urinary tract: Secondary | ICD-10-CM | POA: Diagnosis not present

## 2018-01-28 DIAGNOSIS — N289 Disorder of kidney and ureter, unspecified: Secondary | ICD-10-CM | POA: Diagnosis not present

## 2018-01-28 DIAGNOSIS — M25519 Pain in unspecified shoulder: Secondary | ICD-10-CM

## 2018-01-28 DIAGNOSIS — C775 Secondary and unspecified malignant neoplasm of intrapelvic lymph nodes: Secondary | ICD-10-CM | POA: Diagnosis not present

## 2018-01-28 DIAGNOSIS — Z7189 Other specified counseling: Secondary | ICD-10-CM

## 2018-01-28 MED ORDER — OXYCODONE-ACETAMINOPHEN 5-325 MG PO TABS: 1.0000 | ORAL_TABLET | Freq: Four times a day (QID) | ORAL | 0 refills | Status: DC | PRN

## 2018-01-28 MED ORDER — LIDOCAINE-PRILOCAINE 2.5-2.5 % EX CREA: 1.0000 "application " | TOPICAL_CREAM | CUTANEOUS | 0 refills | Status: DC | PRN

## 2018-01-28 MED ORDER — PROCHLORPERAZINE MALEATE 10 MG PO TABS
10.0000 mg | ORAL_TABLET | Freq: Four times a day (QID) | ORAL | 0 refills | Status: DC | PRN
Start: 2018-01-28 — End: 2018-07-22

## 2018-01-28 NOTE — Progress Notes (Signed)
START ON PATHWAY REGIMEN - Bladder     A cycle is every 21 days:     Carboplatin      Gemcitabine   **Always confirm dose/schedule in your pharmacy ordering system**    Patient Characteristics: Metastatic Disease, First Line, Prior Neoadjuvant/Adjuvant Therapy, Relapse > 12 Months Since Prior Therapy or Not a Candidate for PD-1/PD-L1 Inhibitor AJCC M Category: M1b AJCC N Category: NX AJCC T Category: TX Current evidence of distant metastases<= Yes AJCC 8 Stage Grouping: IVB Line of Therapy: First Line Prior Neoadjuvant/Adjuvant Therapy<= Yes Time to Relapse/Platinum Status: Relapse > 12 Months Since Prior Therapy or Not a Candidate for PD-1/PD-L1 Inhibitor Intent of Therapy: Non-Curative / Palliative Intent, Discussed with Patient

## 2018-01-28 NOTE — Telephone Encounter (Signed)
Appointments scheduled AVS/Calendar printed per 5/29 los °

## 2018-01-28 NOTE — Progress Notes (Signed)
Hematology and Oncology Follow Up Visit  Danny Wood [id number] 1950-12-02 67 y.o. 01/28/2018 10:13 AM Danny Wood, Danny Brow, MD   Principle Diagnosis: 67 year old man with T2N0 high-grade, muscle invasive transitional cell carcinoma of the bladder diagnosed in September 2015.  He developed metastatic disease in 2019.    Prior Therapy:  He underwent a cystoscopy and a TURBT on 05/17/2014.   Neoadjuvant systemic chemotherapy in the form of gemcitabine and cisplatin cycle 1 day 1 is on 06/17/2014. He is S/P two cycles completed in 07/2014.  Therapy tolerated poorly with symptoms of nausea and vomiting and worsening renal function.  He is status post robotic cystoprostatectomy and bilateral lymphadenectomy completed on September 09, 2014.  The final pathology revealed T2N0 without any lymph node involvement.  He developed pelvic adenopathy that was biopsy-proven on Jan 14, 2018 to be metastatic urothelial carcinoma.  Current therapy: Under evaluation for salvage systemic therapy.  Interim History: Danny Wood is here for a follow-up visit.  He is a gentleman I saw in consultation back in 2015 and received neoadjuvant chemotherapy for a T2N0 bladder cancer.  He has been on active surveillance since his surgical therapy under the care of Dr. Tammi Klippel.  He developed local recurrence with a pelvic adenopathy based on CT scan which was biopsied on Jan 14, 2018.  The biopsy confirmed the presence of metastatic transitional cell carcinoma.  Clinically, he reports few complaints including sternal pain especially with deep inspiration and palpation.  He still able to eat and maintain his weight.  He is able to drive and attends to activities of daily living.  He denies any flank pain or hematuria.  His performance status remain excellent.   He does not report any headaches, blurred vision, syncope or seizures.  He denies any fevers or chills or sweats.  His report any chest pain,  shortness of breath, palpitation, orthopnea, PND. He does not report any cough or wheezing. He has not reported any nausea, vomiting,  diarrhea or change in his bowel habits. He does not report any arthralgias or myalgias. He has not observed any lymphadenopathy or petechiae.  He denies any skin rashes or lesions.  He denies any anxiety or depression.  Remainder of his review of systems is negative.   Medications: I have reviewed the patient's current medications.  Current Outpatient Medications  Medication Sig Dispense Refill  . ondansetron (ZOFRAN) 8 MG tablet Take 1 tablet (8 mg total) by mouth every 8 (eight) hours as needed for nausea or vomiting. (Patient not taking: Reported on 07/29/2014) 20 tablet 0  . oxyCODONE-acetaminophen (PERCOCET) 5-325 MG per tablet Take 1-2 tablets by mouth every 6 (six) hours as needed for moderate pain or severe pain. Post-operatively 40 tablet 0  . prochlorperazine (COMPAZINE) 10 MG tablet Take 1 tablet (10 mg total) by mouth every 6 (six) hours as needed for nausea or vomiting. (Patient not taking: Reported on 07/29/2014) 30 tablet 0   No current facility-administered medications for this visit.      Allergies: No Known Allergies  Past Medical History, Surgical history, Social history, and Family History were reviewed and updated.   Physical Exam: Blood pressure (!) 143/80, pulse 76, temperature 98.3 F (36.8 C), temperature source Oral, resp. rate 18, height 6' (1.829 m), weight 209 lb 4.8 oz (94.9 kg), SpO2 98 %.   ECOG: 0  General appearance: Alert, awake gentleman without distress. Head: Atraumatic without abnormalities. Oropharynx: Without any thrush or ulcers. Eyes: No scleral icterus. Lymph nodes:  No lymphadenopathy noted in the cervical, supraclavicular, and axillary nodes  Heart:regular rate and rhythm, no murmurs or gallops. Lung:clear, no wheezing, rales, normal symmetric air entry Abdomin: Soft, nontender without any rebound or  guarding. Skeletal skeletal: No joint deformity or effusion.  He has tenderness noted on palpation of his sternum. Neurological examination: No motor or sensory deficits. Skin: No rashes or lesions.  Lab Results: Lab Results  Component Value Date   WBC 7.7 01/14/2018   HGB 15.9 01/14/2018   HCT 44.3 01/14/2018   MCV 83.4 01/14/2018   PLT 226 01/14/2018     Chemistry      Component Value Date/Time   NA 139 01/14/2018 1117   NA 139 08/05/2014 1335   K 4.1 01/14/2018 1117   K 4.3 08/05/2014 1335   CL 104 01/14/2018 1117   CO2 24 01/14/2018 1117   CO2 26 08/05/2014 1335   BUN 15 01/14/2018 1117   BUN 18.2 08/05/2014 1335   CREATININE 1.03 01/14/2018 1117   CREATININE 1.1 08/05/2014 1335      Component Value Date/Time   CALCIUM 9.1 01/14/2018 1117   CALCIUM 9.5 08/05/2014 1335   ALKPHOS 83 09/08/2014 1416   ALKPHOS 98 08/05/2014 1335   AST 38 (H) 09/08/2014 1416   AST 45 (H) 08/05/2014 1335   ALT 56 (H) 09/08/2014 1416   ALT 73 (H) 08/05/2014 1335   BILITOT 0.8 09/08/2014 1416   BILITOT 0.47 08/05/2014 1335         Impression and Plan:  67 year old man wit:     1. High-grade, transitional cell carcinoma of the bladder diagnosed in September 2015.  He had a T2N0 disease and received neoadjuvant chemotherapy with 2 cycles of cisplatin and gemcitabine were tolerated poorly.  He received a radical cystectomy on January 2016.  He developed recurrent disease in May 2019 with pelvic adenopathy.  He has has been biopsy-proven to show metastatic urothelial carcinoma.  The natural course of this disease was discussed today with the patient in detail today.  The first step is to complete staging work-up including a PET scan to determine the extent of his disease and to fully and accurately given the appropriate prognosis.  It appears that his disease is incurable however palliative chemotherapy will offer him disease control long-term.  Options of therapy would include  retreatment with platinum-based regimen versus non-platinum based combination.  Given the fact that his exposure to platinum was over 2-1/2 years ago, retreatment with platinum based chemotherapy would be adequate.  He had poor tolerance to cisplatin and will proceed with carboplatin and gemcitabine instead.  Complication associated with this therapy was reviewed today which include nausea, fatigue, myelosuppression, neutropenia, neutropenic sepsis, bleeding, renal insufficiency among others.  We will obtain PET scan in the immediate future and start chemotherapy as well.  2. Renal insufficiency: His creatinine is normal at baseline.  3. Antiemetics: Compazine was reordered for him at this time.  4.  IV access: Risks and benefits of Port-A-Cath insertion was discussed today.  These complications will include bleeding, infection among others.  He is agreeable to have a Port-A-Cath inserted.  EMLA cream will be called in for him.  5.  Pain: Prescription for Percocet was given to him today.  His pain appears to be in the sternum and highly suspicious for metastatic disease.  6.  Prognosis: His disease is incurable but his performance status is excellent and aggressive therapy is warranted.  7. Followup: Will be in the next few  weeks this to start chemotherapy.  25  minutes was spent with the patient face-to-face today.  More than 50% of time was dedicated to patient counseling, education and coordination of his multifaceted care.   [name], MD 5/29/201910:13 AM

## 2018-02-09 ENCOUNTER — Other Ambulatory Visit: Payer: Self-pay | Admitting: Oncology

## 2018-02-09 DIAGNOSIS — D494 Neoplasm of unspecified behavior of bladder: Secondary | ICD-10-CM

## 2018-02-10 ENCOUNTER — Other Ambulatory Visit: Payer: Self-pay | Admitting: *Deleted

## 2018-02-10 DIAGNOSIS — C670 Malignant neoplasm of trigone of bladder: Secondary | ICD-10-CM

## 2018-02-10 MED ORDER — OXYCODONE-ACETAMINOPHEN 5-325 MG PO TABS: 1.0000 | ORAL_TABLET | Freq: Four times a day (QID) | ORAL | 0 refills | Status: DC | PRN

## 2018-02-11 ENCOUNTER — Encounter (HOSPITAL_COMMUNITY): Payer: 59

## 2018-02-12 ENCOUNTER — Other Ambulatory Visit: Payer: Self-pay | Admitting: Oncology

## 2018-02-12 ENCOUNTER — Other Ambulatory Visit: Payer: Self-pay | Admitting: *Deleted

## 2018-02-12 DIAGNOSIS — C679 Malignant neoplasm of bladder, unspecified: Secondary | ICD-10-CM

## 2018-02-12 DIAGNOSIS — C670 Malignant neoplasm of trigone of bladder: Secondary | ICD-10-CM

## 2018-02-12 MED ORDER — OXYCODONE-ACETAMINOPHEN 5-325 MG PO TABS: 1.0000 | ORAL_TABLET | Freq: Four times a day (QID) | ORAL | 0 refills | Status: DC | PRN

## 2018-02-16 ENCOUNTER — Other Ambulatory Visit: Payer: 59

## 2018-02-16 ENCOUNTER — Ambulatory Visit (HOSPITAL_COMMUNITY): Payer: 59

## 2018-02-17 ENCOUNTER — Other Ambulatory Visit: Payer: 59

## 2018-02-17 ENCOUNTER — Telehealth: Payer: Self-pay | Admitting: Oncology

## 2018-02-17 ENCOUNTER — Inpatient Hospital Stay: Payer: 59

## 2018-02-17 ENCOUNTER — Inpatient Hospital Stay: Payer: 59 | Attending: Oncology

## 2018-02-17 VITALS — BP 150/81 | HR 82 | Temp 97.7°F | Resp 18

## 2018-02-17 DIAGNOSIS — R072 Precordial pain: Secondary | ICD-10-CM | POA: Diagnosis not present

## 2018-02-17 DIAGNOSIS — N289 Disorder of kidney and ureter, unspecified: Secondary | ICD-10-CM | POA: Insufficient documentation

## 2018-02-17 DIAGNOSIS — C670 Malignant neoplasm of trigone of bladder: Secondary | ICD-10-CM | POA: Insufficient documentation

## 2018-02-17 DIAGNOSIS — Z5111 Encounter for antineoplastic chemotherapy: Secondary | ICD-10-CM | POA: Diagnosis not present

## 2018-02-17 DIAGNOSIS — Z79899 Other long term (current) drug therapy: Secondary | ICD-10-CM | POA: Insufficient documentation

## 2018-02-17 DIAGNOSIS — C775 Secondary and unspecified malignant neoplasm of intrapelvic lymph nodes: Secondary | ICD-10-CM | POA: Insufficient documentation

## 2018-02-17 DIAGNOSIS — R1310 Dysphagia, unspecified: Secondary | ICD-10-CM | POA: Diagnosis not present

## 2018-02-17 DIAGNOSIS — C679 Malignant neoplasm of bladder, unspecified: Secondary | ICD-10-CM

## 2018-02-17 LAB — CBC WITH DIFFERENTIAL (CANCER CENTER ONLY)
Basophils Absolute: 0 10*3/uL (ref 0.0–0.1)
Basophils Relative: 0 %
Eosinophils Absolute: 0.1 10*3/uL (ref 0.0–0.5)
Eosinophils Relative: 2 %
HCT: 47.3 % (ref 38.4–49.9)
Hemoglobin: 16 g/dL (ref 13.0–17.1)
Lymphocytes Relative: 28 %
Lymphs Abs: 1.7 10*3/uL (ref 0.9–3.3)
MCH: 29.1 pg (ref 27.2–33.4)
MCHC: 33.9 g/dL (ref 32.0–36.0)
MCV: 85.8 fL (ref 79.3–98.0)
Monocytes Absolute: 0.6 10*3/uL (ref 0.1–0.9)
Monocytes Relative: 10 %
Neutro Abs: 3.6 10*3/uL (ref 1.5–6.5)
Neutrophils Relative %: 60 %
Platelet Count: 214 10*3/uL (ref 140–400)
RBC: 5.51 MIL/uL (ref 4.20–5.82)
RDW: 13.1 % (ref 11.0–14.6)
WBC Count: 6 10*3/uL (ref 4.0–10.3)

## 2018-02-17 LAB — CMP (CANCER CENTER ONLY)
ALT: 41 U/L (ref 0–55)
AST: 31 U/L (ref 5–34)
Albumin: 3.9 g/dL (ref 3.5–5.0)
Alkaline Phosphatase: 118 U/L (ref 40–150)
Anion gap: 10 (ref 3–11)
BUN: 17 mg/dL (ref 7–26)
CO2: 25 mmol/L (ref 22–29)
Calcium: 9.4 mg/dL (ref 8.4–10.4)
Chloride: 103 mmol/L (ref 98–109)
Creatinine: 1.28 mg/dL (ref 0.70–1.30)
GFR, Est AFR Am: >60 mL/min (ref >60)
GFR, Estimated: 57 mL/min — ABNORMAL LOW (ref >60)
Glucose, Bld: 143 mg/dL — ABNORMAL HIGH (ref 70–140)
Potassium: 3.7 mmol/L (ref 3.5–5.1)
Sodium: 138 mmol/L (ref 136–145)
Total Bilirubin: 0.6 mg/dL (ref 0.2–1.2)
Total Protein: 7.9 g/dL (ref 6.4–8.3)

## 2018-02-17 MED ORDER — PALONOSETRON HCL INJECTION 0.25 MG/5ML
INTRAVENOUS | Status: AC
  Filled 2018-02-17: qty 5

## 2018-02-17 MED ORDER — PALONOSETRON HCL INJECTION 0.25 MG/5ML
0.2500 mg | Freq: Once | INTRAVENOUS | Status: AC
  Administered 2018-02-17: 0.25 mg via INTRAVENOUS

## 2018-02-17 MED ORDER — SODIUM CHLORIDE 0.9 % IV SOLN
1000.0000 mg/m2 | Freq: Once | INTRAVENOUS | Status: AC
  Administered 2018-02-17: 2204 mg via INTRAVENOUS
  Filled 2018-02-17: qty 57.97

## 2018-02-17 MED ORDER — DEXAMETHASONE SODIUM PHOSPHATE 10 MG/ML IJ SOLN
10.0000 mg | Freq: Once | INTRAMUSCULAR | Status: AC
  Administered 2018-02-17: 10 mg via INTRAVENOUS

## 2018-02-17 MED ORDER — DEXAMETHASONE SODIUM PHOSPHATE 10 MG/ML IJ SOLN
INTRAMUSCULAR | Status: AC
  Filled 2018-02-17: qty 1

## 2018-02-17 MED ORDER — CARBOPLATIN CHEMO INJECTION 600 MG/60ML
506.0000 mg | Freq: Once | INTRAVENOUS | Status: AC
  Administered 2018-02-17: 510 mg via INTRAVENOUS
  Filled 2018-02-17: qty 51

## 2018-02-17 MED ORDER — SODIUM CHLORIDE 0.9 % IV SOLN
Freq: Once | INTRAVENOUS | Status: AC
  Administered 2018-02-17: 16:00:00 via INTRAVENOUS

## 2018-02-17 NOTE — Patient Instructions (Addendum)
[facility] Discharge Instructions for Patients Receiving Chemotherapy  Today you received the following chemotherapy agents gemcitabine (Gemzar) and carboplatin (Paraplatin)  To help prevent nausea and vomiting after your treatment, we encourage you to take your nausea medication as directed by your doctor.   If you develop nausea and vomiting that is not controlled by your nausea medication, call the clinic.   BELOW ARE SYMPTOMS THAT SHOULD BE REPORTED IMMEDIATELY:  *FEVER GREATER THAN 100.5 F  *CHILLS WITH OR WITHOUT FEVER  NAUSEA AND VOMITING THAT IS NOT CONTROLLED WITH YOUR NAUSEA MEDICATION  *UNUSUAL SHORTNESS OF BREATH  *UNUSUAL BRUISING OR BLEEDING  TENDERNESS IN MOUTH AND THROAT WITH OR WITHOUT PRESENCE OF ULCERS  *URINARY PROBLEMS  *BOWEL PROBLEMS  UNUSUAL RASH Items with * indicate a potential emergency and should be followed up as soon as possible.  Feel free to call the clinic should you have any questions or concerns. The clinic phone number is [phone].  Please show the CHEMO ALERT CARD at check-in to the Emergency Department and triage nurse.  Gemcitabine injection What is this medicine? GEMCITABINE (jem SIT a been) is a chemotherapy drug. This medicine is used to treat many types of cancer like breast cancer, lung cancer, pancreatic cancer, and ovarian cancer. This medicine may be used for other purposes; ask your health care provider or pharmacist if you have questions. COMMON BRAND NAME(S): Gemzar What should I tell my health care provider before I take this medicine? They need to know if you have any of these conditions: -blood disorders -infection -kidney disease -liver disease -recent or ongoing radiation therapy -an unusual or allergic reaction to gemcitabine, other chemotherapy, other medicines, foods, dyes, or preservatives -pregnant or trying to get pregnant -breast-feeding How should I use this medicine? This drug is  given as an infusion into a vein. It is administered in a hospital or clinic by a specially trained health care professional. Talk to your pediatrician regarding the use of this medicine in children. Special care may be needed. Overdosage: If you think you have taken too much of this medicine contact a poison control center or emergency room at once. NOTE: This medicine is only for you. Do not share this medicine with others. What if I miss a dose? It is important not to miss your dose. Call your doctor or health care professional if you are unable to keep an appointment. What may interact with this medicine? -medicines to increase blood counts like filgrastim, pegfilgrastim, sargramostim -some other chemotherapy drugs like cisplatin -vaccines Talk to your doctor or health care professional before taking any of these medicines: -acetaminophen -aspirin -ibuprofen -ketoprofen -naproxen This list may not describe all possible interactions. Give your health care provider a list of all the medicines, herbs, non-prescription drugs, or dietary supplements you use. Also tell them if you smoke, drink alcohol, or use illegal drugs. Some items may interact with your medicine. What should I watch for while using this medicine? Visit your doctor for checks on your progress. This drug may make you feel generally unwell. This is not uncommon, as chemotherapy can affect healthy cells as well as cancer cells. Report any side effects. Continue your course of treatment even though you feel ill unless your doctor tells you to stop. In some cases, you may be given additional medicines to help with side effects. Follow all directions for their use. Call your doctor or health care professional for advice if you get a fever, chills or sore throat,   or other symptoms of a cold or flu. Do not treat yourself. This drug decreases your body's ability to fight infections. Try to avoid being around people who are sick. This  medicine may increase your risk to bruise or bleed. Call your doctor or health care professional if you notice any unusual bleeding. Be careful brushing and flossing your teeth or using a toothpick because you may get an infection or bleed more easily. If you have any dental work done, tell your dentist you are receiving this medicine. Avoid taking products that contain aspirin, acetaminophen, ibuprofen, naproxen, or ketoprofen unless instructed by your doctor. These medicines may hide a fever. Women should inform their doctor if they wish to become pregnant or think they might be pregnant. There is a potential for serious side effects to an unborn child. Talk to your health care professional or pharmacist for more information. Do not breast-feed an infant while taking this medicine. What side effects may I notice from receiving this medicine? Side effects that you should report to your doctor or health care professional as soon as possible: -allergic reactions like skin rash, itching or hives, swelling of the face, lips, or tongue -low blood counts - this medicine may decrease the number of white blood cells, red blood cells and platelets. You may be at increased risk for infections and bleeding. -signs of infection - fever or chills, cough, sore throat, pain or difficulty passing urine -signs of decreased platelets or bleeding - bruising, pinpoint red spots on the skin, black, tarry stools, blood in the urine -signs of decreased red blood cells - unusually weak or tired, fainting spells, lightheadedness -breathing problems -chest pain -mouth sores -nausea and vomiting -pain, swelling, redness at site where injected -pain, tingling, numbness in the hands or feet -stomach pain -swelling of ankles, feet, hands -unusual bleeding Side effects that usually do not require medical attention (report to your doctor or health care professional if they continue or are  bothersome): -constipation -diarrhea -hair loss -loss of appetite -stomach upset This list may not describe all possible side effects. Call your doctor for medical advice about side effects. You may report side effects to FDA at 1-800-FDA-1088. Where should I keep my medicine? This drug is given in a hospital or clinic and will not be stored at home. NOTE: This sheet is a summary. It may not cover all possible information. If you have questions about this medicine, talk to your doctor, pharmacist, or health care provider.  2018 Elsevier/Gold Standard (2007-12-29 18:45:54)  Carboplatin injection What is this medicine? CARBOPLATIN (KAR boe pla tin) is a chemotherapy drug. It targets fast dividing cells, like cancer cells, and causes these cells to die. This medicine is used to treat ovarian cancer and many other cancers. This medicine may be used for other purposes; ask your health care provider or pharmacist if you have questions. COMMON BRAND NAME(S): Paraplatin What should I tell my health care provider before I take this medicine? They need to know if you have any of these conditions: -blood disorders -hearing problems -kidney disease -recent or ongoing radiation therapy -an unusual or allergic reaction to carboplatin, cisplatin, other chemotherapy, other medicines, foods, dyes, or preservatives -pregnant or trying to get pregnant -breast-feeding How should I use this medicine? This drug is usually given as an infusion into a vein. It is administered in a hospital or clinic by a specially trained health care professional. Talk to your pediatrician regarding the use of this medicine in children. Special   care may be needed. Overdosage: If you think you have taken too much of this medicine contact a poison control center or emergency room at once. NOTE: This medicine is only for you. Do not share this medicine with others. What if I miss a dose? It is important not to miss a dose.  Call your doctor or health care professional if you are unable to keep an appointment. What may interact with this medicine? -medicines for seizures -medicines to increase blood counts like filgrastim, pegfilgrastim, sargramostim -some antibiotics like amikacin, gentamicin, neomycin, streptomycin, tobramycin -vaccines Talk to your doctor or health care professional before taking any of these medicines: -acetaminophen -aspirin -ibuprofen -ketoprofen -naproxen This list may not describe all possible interactions. Give your health care provider a list of all the medicines, herbs, non-prescription drugs, or dietary supplements you use. Also tell them if you smoke, drink alcohol, or use illegal drugs. Some items may interact with your medicine. What should I watch for while using this medicine? Your condition will be monitored carefully while you are receiving this medicine. You will need important blood work done while you are taking this medicine. This drug may make you feel generally unwell. This is not uncommon, as chemotherapy can affect healthy cells as well as cancer cells. Report any side effects. Continue your course of treatment even though you feel ill unless your doctor tells you to stop. In some cases, you may be given additional medicines to help with side effects. Follow all directions for their use. Call your doctor or health care professional for advice if you get a fever, chills or sore throat, or other symptoms of a cold or flu. Do not treat yourself. This drug decreases your body's ability to fight infections. Try to avoid being around people who are sick. This medicine may increase your risk to bruise or bleed. Call your doctor or health care professional if you notice any unusual bleeding. Be careful brushing and flossing your teeth or using a toothpick because you may get an infection or bleed more easily. If you have any dental work done, tell your dentist you are receiving this  medicine. Avoid taking products that contain aspirin, acetaminophen, ibuprofen, naproxen, or ketoprofen unless instructed by your doctor. These medicines may hide a fever. Do not become pregnant while taking this medicine. Women should inform their doctor if they wish to become pregnant or think they might be pregnant. There is a potential for serious side effects to an unborn child. Talk to your health care professional or pharmacist for more information. Do not breast-feed an infant while taking this medicine. What side effects may I notice from receiving this medicine? Side effects that you should report to your doctor or health care professional as soon as possible: -allergic reactions like skin rash, itching or hives, swelling of the face, lips, or tongue -signs of infection - fever or chills, cough, sore throat, pain or difficulty passing urine -signs of decreased platelets or bleeding - bruising, pinpoint red spots on the skin, black, tarry stools, nosebleeds -signs of decreased red blood cells - unusually weak or tired, fainting spells, lightheadedness -breathing problems -changes in hearing -changes in vision -chest pain -high blood pressure -low blood counts - This drug may decrease the number of white blood cells, red blood cells and platelets. You may be at increased risk for infections and bleeding. -nausea and vomiting -pain, swelling, redness or irritation at the injection site -pain, tingling, numbness in the hands or feet -problems with   balance, talking, walking -trouble passing urine or change in the amount of urine Side effects that usually do not require medical attention (report to your doctor or health care professional if they continue or are bothersome): -hair loss -loss of appetite -metallic taste in the mouth or changes in taste This list may not describe all possible side effects. Call your doctor for medical advice about side effects. You may report side effects to  FDA at 1-800-FDA-1088. Where should I keep my medicine? This drug is given in a hospital or clinic and will not be stored at home. NOTE: This sheet is a summary. It may not cover all possible information. If you have questions about this medicine, talk to your doctor, pharmacist, or health care provider.  2018 Elsevier/Gold Standard (2007-11-24 14:38:05)    

## 2018-02-17 NOTE — Telephone Encounter (Signed)
Called pt re adding lab appt - left vm for pt re appts.

## 2018-02-18 ENCOUNTER — Telehealth: Payer: Self-pay | Admitting: *Deleted

## 2018-02-18 NOTE — Telephone Encounter (Signed)
Spoke with patient, states he is doing well, eating and hydrating well. No c/o. Encouraged to call desk nusre with any problems

## 2018-02-18 NOTE — Telephone Encounter (Signed)
-----   Message from [place], RN sent at 02/17/2018  6:13 PM EDT ----- Regarding: Dr. Alen Blew chemo follow up call First time [place] and gemzar infusion. Pt tolerated well the infusion.

## 2018-02-19 ENCOUNTER — Other Ambulatory Visit: Payer: Self-pay | Admitting: Radiology

## 2018-02-20 ENCOUNTER — Other Ambulatory Visit: Payer: Self-pay | Admitting: Oncology

## 2018-02-20 ENCOUNTER — Ambulatory Visit (HOSPITAL_COMMUNITY)
Admission: RE | Admit: 2018-02-20 | Discharge: 2018-02-20 | Disposition: A | Discharge status: Home / Self Care | Payer: 59 | Source: Ambulatory Visit | Attending: Oncology | Admitting: Oncology

## 2018-02-20 ENCOUNTER — Encounter (HOSPITAL_COMMUNITY): Payer: Self-pay | Admitting: Interventional Radiology

## 2018-02-20 DIAGNOSIS — R3915 Urgency of urination: Secondary | ICD-10-CM | POA: Insufficient documentation

## 2018-02-20 DIAGNOSIS — Z905 Acquired absence of kidney: Secondary | ICD-10-CM | POA: Insufficient documentation

## 2018-02-20 DIAGNOSIS — Z5111 Encounter for antineoplastic chemotherapy: Secondary | ICD-10-CM | POA: Diagnosis not present

## 2018-02-20 DIAGNOSIS — Z85528 Personal history of other malignant neoplasm of kidney: Secondary | ICD-10-CM | POA: Diagnosis not present

## 2018-02-20 DIAGNOSIS — Z79899 Other long term (current) drug therapy: Secondary | ICD-10-CM | POA: Insufficient documentation

## 2018-02-20 DIAGNOSIS — C679 Malignant neoplasm of bladder, unspecified: Secondary | ICD-10-CM | POA: Insufficient documentation

## 2018-02-20 DIAGNOSIS — Z9889 Other specified postprocedural states: Secondary | ICD-10-CM | POA: Diagnosis not present

## 2018-02-20 HISTORY — PX: IR IMAGING GUIDED PORT INSERTION: IMG5740

## 2018-02-20 LAB — PROTIME-INR
INR: 0.98
Prothrombin Time: 12.9 seconds (ref 11.4–15.2)

## 2018-02-20 LAB — CBC WITH DIFFERENTIAL/PLATELET
Basophils Absolute: 0 10*3/uL (ref 0.0–0.1)
Basophils Relative: 0 %
Eosinophils Absolute: 0.2 10*3/uL (ref 0.0–0.7)
Eosinophils Relative: 3 %
HCT: 45.6 % (ref 39.0–52.0)
Hemoglobin: 16.1 g/dL (ref 13.0–17.0)
Lymphocytes Relative: 22 %
Lymphs Abs: 1.5 10*3/uL (ref 0.7–4.0)
MCH: 29.9 pg (ref 26.0–34.0)
MCHC: 35.3 g/dL (ref 30.0–36.0)
MCV: 84.6 fL (ref 78.0–100.0)
Monocytes Absolute: 0.1 10*3/uL (ref 0.1–1.0)
Monocytes Relative: 2 %
Neutro Abs: 5.2 10*3/uL (ref 1.7–7.7)
Neutrophils Relative %: 73 %
Platelets: 215 10*3/uL (ref 150–400)
RBC: 5.39 MIL/uL (ref 4.22–5.81)
RDW: 12.5 % (ref 11.5–15.5)
WBC: 7.1 10*3/uL (ref 4.0–10.5)

## 2018-02-20 MED ORDER — LIDOCAINE HCL (PF) 1 % IJ SOLN
INTRAMUSCULAR | Status: AC | PRN
  Administered 2018-02-20: 10 mL

## 2018-02-20 MED ORDER — FENTANYL CITRATE (PF) 100 MCG/2ML IJ SOLN
INTRAMUSCULAR | Status: AC
  Filled 2018-02-20: qty 2

## 2018-02-20 MED ORDER — CEFAZOLIN SODIUM-DEXTROSE 2-4 GM/100ML-% IV SOLN
2.0000 g | INTRAVENOUS | Status: AC
  Administered 2018-02-20: 2 g via INTRAVENOUS

## 2018-02-20 MED ORDER — HEPARIN SOD (PORK) LOCK FLUSH 100 UNIT/ML IV SOLN
INTRAVENOUS | Status: AC | PRN
  Administered 2018-02-20: 500 [IU] via INTRAVENOUS

## 2018-02-20 MED ORDER — LIDOCAINE HCL (PF) 1 % IJ SOLN
INTRAMUSCULAR | Status: AC
  Filled 2018-02-20: qty 30

## 2018-02-20 MED ORDER — SODIUM CHLORIDE 0.9 % IV SOLN
INTRAVENOUS | Status: DC
  Administered 2018-02-20: 11:00:00 via INTRAVENOUS

## 2018-02-20 MED ORDER — CEFAZOLIN SODIUM-DEXTROSE 2-4 GM/100ML-% IV SOLN
INTRAVENOUS | Status: AC
  Administered 2018-02-20: 2 g via INTRAVENOUS
  Filled 2018-02-20: qty 100

## 2018-02-20 MED ORDER — FENTANYL CITRATE (PF) 100 MCG/2ML IJ SOLN
INTRAMUSCULAR | Status: AC | PRN
  Administered 2018-02-20 (×2): 50 ug via INTRAVENOUS

## 2018-02-20 MED ORDER — LIDOCAINE-EPINEPHRINE (PF) 2 %-1:200000 IJ SOLN
INTRAMUSCULAR | Status: AC
  Filled 2018-02-20: qty 20

## 2018-02-20 MED ORDER — MIDAZOLAM HCL 2 MG/2ML IJ SOLN
INTRAMUSCULAR | Status: AC
  Filled 2018-02-20: qty 4

## 2018-02-20 MED ORDER — MIDAZOLAM HCL 2 MG/2ML IJ SOLN
INTRAMUSCULAR | Status: AC | PRN
  Administered 2018-02-20 (×3): 1 mg via INTRAVENOUS

## 2018-02-20 MED ORDER — HEPARIN SOD (PORK) LOCK FLUSH 100 UNIT/ML IV SOLN
INTRAVENOUS | Status: AC
  Filled 2018-02-20: qty 5

## 2018-02-20 MED ORDER — LIDOCAINE-EPINEPHRINE (PF) 2 %-1:200000 IJ SOLN
INTRAMUSCULAR | Status: AC | PRN
  Administered 2018-02-20: 10 mL

## 2018-02-20 NOTE — Procedures (Signed)
  Procedure: R IJ Port placement   EBL:   minimal Complications:  none immediate  See full dictation in [facility] P[facility].  D. Monterrio Gerst MD Main # [phone] Pager  [phone]    

## 2018-02-20 NOTE — Discharge Instructions (Signed)
Moderate Conscious Sedation, Adult, Care After °These instructions provide you with information about caring for yourself after your procedure. Your health care provider may also give you more specific instructions. Your treatment has been planned according to current medical practices, but problems sometimes occur. Call your health care provider if you have any problems or questions after your procedure. °What can I expect after the procedure? °After your procedure, it is common: °· To feel sleepy for several hours. °· To feel clumsy and have poor balance for several hours. °· To have poor judgment for several hours. °· To vomit if you eat too soon. ° °Follow these instructions at home: °For at least 24 hours after the procedure: ° °· Do not: °? Participate in activities where you could fall or become injured. °? Drive. °? Use heavy machinery. °? Drink alcohol. °? Take sleeping pills or medicines that cause drowsiness. °? Make important decisions or sign legal documents. °? Take care of children on your own. °· Rest. °Eating and drinking °· Follow the diet recommended by your health care provider. °· If you vomit: °? Drink water, juice, or soup when you can drink without vomiting. °? Make sure you have little or no nausea before eating solid foods. °General instructions °· Have a responsible adult stay with you until you are awake and alert. °· Take over-the-counter and prescription medicines only as told by your health care provider. °· If you smoke, do not smoke without supervision. °· Keep all follow-up visits as told by your health care provider. This is important. °Contact a health care provider if: °· You keep feeling nauseous or you keep vomiting. °· You feel light-headed. °· You develop a rash. °· You have a fever. °Get help right away if: °· You have trouble breathing. °This information is not intended to replace advice given to you by your health care provider. Make sure you discuss any questions you have  with your health care provider. °Document Released: 06/09/2013 Document Revised: 01/22/2016 Document Reviewed: 12/09/2015 °Elsevier Interactive Patient Education © 2018 Elsevier Inc[employer] ° ° °Implanted Port Insertion, Care After °This sheet gives you information about how to care for yourself after your procedure. Your health care provider may also give you more specific instructions. If you have problems or questions, contact your health care provider. °What can I expect after the procedure? °After your procedure, it is common to have: °· Discomfort at the port insertion site. °· Bruising on the skin over the port. This should improve over 3-4 days. ° °Follow these instructions at home: °Port care °· After your port is placed, you will get a manufacturer's information card. The card has information about your port. Keep this card with you at all times. °· Take care of the port as told by your health care provider. Ask your health care provider if you or a family member can get training for taking care of the port at home. A home health care nurse may also take care of the port. °· Make sure to remember what type of port you have. °Incision care °· Follow instructions from your health care provider about how to take care of your port insertion site. Make sure you: °? Wash your hands with soap and water before you change your bandage (dressing). If soap and water are not available, use hand sanitizer. °? Change your dressing as told by your health care provider.  You may remove your dressing tomorrow. °? Leave stitches skin glue in place. These skin   closures may need to stay in place for 2 weeks or longer. If adhesive strip edges start to loosen and curl up, you may trim the loose edges. Do not remove adhesive strips completely unless your health care provider tells you to do that.  DO NOT use EMLA cream for 2 weeks after port placement as this cream will remove surgical glue on your incision. °· Check your port  insertion site every day for signs of infection. Check for: °? More redness, swelling, or pain. °? More fluid or blood. °? Warmth. °? Pus or a bad smell. °General instructions  °· Do not take baths, swim, or use a hot tub until your health care provider approves.  You may shower tomorrow. °· Do not lift anything that is heavier than 10 lb (4.5 kg) for a week, or as told by your health care provider. °· Ask your health care provider when it is okay to: °? Return to work or school. °? Resume usual physical activities or sports. °· Do not drive for 24 hours if you were given a medicine to help you relax (sedative). °· Take over-the-counter and prescription medicines only as told by your health care provider. °· Wear a medical alert bracelet in case of an emergency. This will tell any health care providers that you have a port. °· Keep all follow-up visits as told by your health care provider. This is important. °Contact a health care provider if: °· You have a fever or chills. °· You have more redness, swelling, or pain around your port insertion site. °· You have more fluid or blood coming from your port insertion site. °· Your port insertion site feels warm to the touch. °· You have pus or a bad smell coming from the port insertion site. °Get help right away if: °· You have chest pain or shortness of breath. °· You have bleeding from your port that you cannot control. °Summary °· Take care of the port as told by your health care provider. °· Change your dressing as told by your health care provider. °· Keep all follow-up visits as told by your health care provider. °This information is not intended to replace advice given to you by your health care provider. Make sure you discuss any questions you have with your health care provider. °Document Released: 06/09/2013 Document Revised: 07/10/2016 Document Reviewed: 07/10/2016 °Elsevier Interactive Patient Education © 2017 Elsevier Inc. ° °

## 2018-02-20 NOTE — H&P (Signed)
Referring Physician(s): Wyatt Portela  Supervising Physician: Arne Cleveland  Patient Status:  [facility]  Chief Complaint:  "I'm getting a Port-A-Cath"  Subjective: Patient familiar to IR service from prior right inguinal lymph node biopsy on 01/14/2018.  He has a history of bladder carcinoma initially diagnosed in 2015 with prior TURBT and chemotherapy as well as cystoprostatectomy with bilateral lymphadenectomy in 2016.  He now has evidence of metastatic disease and presents today for Port-A-Cath placement for additional treatment.  Currently denies fever, headache, dyspnea, cough, abdominal pain, nausea, vomiting or bleeding.  He does have intermittent mid chest discomfort and back pain.    Past Medical History:  Diagnosis Date  . At risk for sleep apnea    STOP-BANG= 4       SENT TO PCP 05-16-2014  . Bladder disorder   . Cancer [facility])    Bladder  . Hematuria   . History of renal cell carcinoma    2003  S/P  PARTIAL RIGHT NEPHRECTOMY  . Presence of surgical incision    lumbar diskectomy 05-11-2014-  bandage present  . Urgency of urination    Past Surgical History:  Procedure Laterality Date  . CYSTOSCOPY N/A 09/09/2014   Procedure: CYSTOSCOPY WITH INDOCYANINE GREEN DYE INJECTION AND URETHRAL DILATION;  Surgeon: Alexis Frock, MD;  Location: [facility];  Service: Urology;  Laterality: N/A;  . CYSTOSCOPY WITH BIOPSY N/A 05/17/2014   Procedure: CYSTO WITH BLADDER BIOPSY;  Surgeon: Malka So, MD;  Location: [facility];  Service: Urology;  Laterality: N/A;  . EVALUATION UNDER ANESTHESIA WITH HEMORRHOIDECTOMY  07/07/2012   Procedure: EXAM UNDER ANESTHESIA WITH HEMORRHOIDECTOMY;  Surgeon: Joyice Faster. Cornett, MD;  Location: [place];  Service: General;  Laterality: N/A;  . LUMBAR MICRODISCECTOMY  05-11-2014   L4 -- L5 (partial)  . PARTIAL NEPHRECTOMY Right 2003  . ROBOT ASSISTED LAPAROSCOPIC COMPLETE CYSTECT ILEAL CONDUIT N/A 09/09/2014   Procedure: ROBOTIC ASSISTED  LAPAROSCOPIC COMPLETE CYSTOPROSTATECTOMY,  ILEAL CONDUIT;  Surgeon: Alexis Frock, MD;  Location: [facility];  Service: Urology;  Laterality: N/A;    Allergies: Patient has no known allergies.  Medications: Prior to Admission medications   Medication Sig Start Date End Date Taking? Authorizing Provider  lidocaine-prilocaine (EMLA) cream Apply 1 application topically as needed. 01/28/18   Wyatt Portela, MD  ondansetron (ZOFRAN) 8 MG tablet Take 1 tablet (8 mg total) by mouth every 8 (eight) hours as needed for nausea or vomiting. Patient not taking: Reported on 07/29/2014 06/01/14   Wyatt Portela, MD  oxyCODONE-acetaminophen (PERCOCET) 5-325 MG tablet Take 1-2 tablets by mouth every 6 (six) hours as needed for moderate pain or severe pain. 02/12/18   Wyatt Portela, MD  prochlorperazine (COMPAZINE) 10 MG tablet Take 1 tablet (10 mg total) by mouth every 6 (six) hours as needed for nausea or vomiting. 01/28/18   Wyatt Portela, MD     Vital Signs: Blood pressure 132/80, heart rate 67, temp 98.2, respirations 18, O2 sat 100% room air   Physical Exam awake, alert.  Chest clear to auscultation bilaterally.  Heart with regular rate and rhythm.  Abdomen soft, positive bowel sounds, intact urostomy.  No lower extremity edema.  Imaging: No results found.  Labs:  CBC: Recent Labs    01/14/18 1117 02/17/18 1500  WBC 7.7 6.0  HGB 15.9 16.0  HCT 44.3 47.3  PLT 226 214    COAGS: Recent Labs    01/14/18 1117  INR 0.95  BMP: Recent Labs    01/14/18 1117 02/17/18 1500  NA 139 138  K 4.1 3.7  CL 104 103  CO2 24 25  GLUCOSE 144* 143*  BUN 15 17  CALCIUM 9.1 9.4  CREATININE 1.03 1.28  GFRNONAA >60 57*  GFRAA >60 >60    LIVER FUNCTION TESTS: Recent Labs    02/17/18 1500  BILITOT 0.6  AST 31  ALT 41  ALKPHOS 118  PROT 7.9  ALBUMIN 3.9    Assessment and Plan: Pt with history of bladder carcinoma initially diagnosed in 2015 with prior TURBT and chemotherapy as well  as cystoprostatectomy with bilateral lymphadenectomy in 2016.  He now has evidence of metastatic disease and presents today for Port-A-Cath placement for additional treatment. Risks and benefits of image guided port-a-catheter placement was discussed with the patient including, but not limited to bleeding, infection, pneumothorax, or fibrin sheath development and need for additional procedures.  All of the patient's questions were answered, patient is agreeable to proceed. Consent signed and in chart.  LABS PENDING   Electronically Signed: D. Rowe Robert, PA-C 02/20/2018, 10:02 AM   I spent a total of 25 minutes at the the patient's bedside AND on the patient's hospital floor or unit, greater than 50% of which was counseling/coordinating care for Port-A-Cath placement

## 2018-02-24 ENCOUNTER — Telehealth: Payer: Self-pay

## 2018-02-24 ENCOUNTER — Inpatient Hospital Stay: Payer: 59

## 2018-02-24 ENCOUNTER — Inpatient Hospital Stay (HOSPITAL_BASED_OUTPATIENT_CLINIC_OR_DEPARTMENT_OTHER): Payer: 59 | Admitting: Oncology

## 2018-02-24 VITALS — BP 153/87 | HR 77 | Temp 98.4°F | Resp 17 | Ht 72.0 in | Wt 201.6 lb

## 2018-02-24 DIAGNOSIS — Z79899 Other long term (current) drug therapy: Secondary | ICD-10-CM

## 2018-02-24 DIAGNOSIS — N289 Disorder of kidney and ureter, unspecified: Secondary | ICD-10-CM | POA: Diagnosis not present

## 2018-02-24 DIAGNOSIS — C679 Malignant neoplasm of bladder, unspecified: Secondary | ICD-10-CM

## 2018-02-24 DIAGNOSIS — R1310 Dysphagia, unspecified: Secondary | ICD-10-CM

## 2018-02-24 DIAGNOSIS — C775 Secondary and unspecified malignant neoplasm of intrapelvic lymph nodes: Secondary | ICD-10-CM | POA: Diagnosis not present

## 2018-02-24 DIAGNOSIS — R071 Chest pain on breathing: Secondary | ICD-10-CM

## 2018-02-24 DIAGNOSIS — C670 Malignant neoplasm of trigone of bladder: Secondary | ICD-10-CM

## 2018-02-24 DIAGNOSIS — R072 Precordial pain: Secondary | ICD-10-CM | POA: Diagnosis not present

## 2018-02-24 DIAGNOSIS — Z95828 Presence of other vascular implants and grafts: Secondary | ICD-10-CM | POA: Insufficient documentation

## 2018-02-24 DIAGNOSIS — Z5111 Encounter for antineoplastic chemotherapy: Secondary | ICD-10-CM | POA: Diagnosis not present

## 2018-02-24 LAB — CMP (CANCER CENTER ONLY)
ALT: 90 U/L — ABNORMAL HIGH (ref 0–44)
AST: 50 U/L — ABNORMAL HIGH (ref 15–41)
Albumin: 4 g/dL (ref 3.5–5.0)
Alkaline Phosphatase: 102 U/L (ref 38–126)
Anion gap: 10 (ref 5–15)
BUN: 26 mg/dL — ABNORMAL HIGH (ref 8–23)
CO2: 23 mmol/L (ref 22–32)
Calcium: 9.5 mg/dL (ref 8.9–10.3)
Chloride: 100 mmol/L (ref 98–111)
Creatinine: 1.28 mg/dL — ABNORMAL HIGH (ref 0.61–1.24)
GFR, Est AFR Am: >60 mL/min (ref >60)
GFR, Estimated: 57 mL/min — ABNORMAL LOW (ref >60)
Glucose, Bld: 209 mg/dL — ABNORMAL HIGH (ref 70–99)
Potassium: 4.4 mmol/L (ref 3.5–5.1)
Sodium: 133 mmol/L — ABNORMAL LOW (ref 135–145)
Total Bilirubin: 0.8 mg/dL (ref 0.3–1.2)
Total Protein: 7.7 g/dL (ref 6.5–8.1)

## 2018-02-24 MED ORDER — SODIUM CHLORIDE 0.9% FLUSH
10.0000 mL | Freq: Once | INTRAVENOUS | Status: DC
  Filled 2018-02-24: qty 10

## 2018-02-24 MED ORDER — OXYCODONE-ACETAMINOPHEN 5-325 MG PO TABS: 1.0000 | ORAL_TABLET | Freq: Four times a day (QID) | ORAL | 0 refills | Status: DC | PRN

## 2018-02-24 MED ORDER — PROCHLORPERAZINE MALEATE 10 MG PO TABS
ORAL_TABLET | ORAL | Status: AC
  Filled 2018-02-24: qty 1

## 2018-02-24 MED ORDER — SODIUM CHLORIDE 0.9 % IV SOLN
Freq: Once | INTRAVENOUS | Status: AC
  Administered 2018-02-24: 10:00:00 via INTRAVENOUS

## 2018-02-24 MED ORDER — GEMCITABINE HCL CHEMO INJECTION 1 GM/26.3ML
1000.0000 mg/m2 | Freq: Once | INTRAVENOUS | Status: AC
  Administered 2018-02-24: 2204 mg via INTRAVENOUS
  Filled 2018-02-24: qty 57.97

## 2018-02-24 MED ORDER — PROCHLORPERAZINE MALEATE 10 MG PO TABS
10.0000 mg | ORAL_TABLET | Freq: Once | ORAL | Status: AC
  Administered 2018-02-24: 10 mg via ORAL

## 2018-02-24 NOTE — Telephone Encounter (Signed)
Printed avs and calender of upcoming appointment. Was unable to line up appointment times based on the 9:15 time Shadad asked to schedule patient. due to availibilty  infusion and flush slot would have had patient waiting for hours in between

## 2018-02-24 NOTE — Patient Instructions (Signed)
[facility] Discharge Instructions for Patients Receiving Chemotherapy  Today you received the following chemotherapy agents gemcitabine (Gemzar)  To help prevent nausea and vomiting after your treatment, we encourage you to take your nausea medication as directed by your doctor.   If you develop nausea and vomiting that is not controlled by your nausea medication, call the clinic.   BELOW ARE SYMPTOMS THAT SHOULD BE REPORTED IMMEDIATELY:  *FEVER GREATER THAN 100.5 F  *CHILLS WITH OR WITHOUT FEVER  NAUSEA AND VOMITING THAT IS NOT CONTROLLED WITH YOUR NAUSEA MEDICATION  *UNUSUAL SHORTNESS OF BREATH  *UNUSUAL BRUISING OR BLEEDING  TENDERNESS IN MOUTH AND THROAT WITH OR WITHOUT PRESENCE OF ULCERS  *URINARY PROBLEMS  *BOWEL PROBLEMS  UNUSUAL RASH Items with * indicate a potential emergency and should be followed up as soon as possible.  Feel free to call the clinic should you have any questions or concerns. The clinic phone number is [phone].  Please show the CHEMO ALERT CARD at check-in to the Emergency Department and triage nurse.   

## 2018-02-24 NOTE — Progress Notes (Signed)
Hematology and Oncology Follow Up Visit  Danny Wood [id number] [birth year] 67 y.o. 02/24/2018 8:33 AM [name], [name], MD   Principle Diagnosis: 67 year old man with stage IV bladder cancer documented in May 2019 with pelvic adenopathy.  He was initially diagnosed with T2N0 high-grade, muscle invasive transitional cell carcinoma of the bladder diagnosed in September 2015.    Prior Therapy:  He underwent a cystoscopy and a TURBT on 05/17/2014.   Neoadjuvant systemic chemotherapy in the form of gemcitabine and cisplatin cycle 1 day 1 is on 06/17/2014. He is S/P two cycles completed in 07/2014.  Therapy tolerated poorly with symptoms of nausea and vomiting and worsening renal function.  He is status post robotic cystoprostatectomy and bilateral lymphadenectomy completed on September 09, 2014.  The final pathology revealed T2N0 without any lymph node involvement.  He developed pelvic adenopathy that was biopsy-proven on Jan 14, 2018 to be metastatic urothelial carcinoma.  Current therapy: Carboplatin and gemcitabine cycle 1 started on 02/17/2018.  Is here for day 8 of cycle 1.  Interim History: Danny Wood presents today for a follow-up.  Since the last visit, he had a Port-A-Cath inserted without any complications.  He received day 1 of cycle 1 of chemotherapy utilizing gemcitabine and carboplatin.  He tolerated chemotherapy reasonably well without any complications.  He denies any nausea, vomiting or infusion related complications.  He denies any worsening diarrhea.  Continues to have issues with dysphasia as well as chest wall pain.  He has reported complaints of pain with deep inspiration.  He of lost 20 pounds in the last few months.  He still able to eat soft diet but has issues with more solid food.  He does not report any headaches, blurred vision, syncope or seizures.  He denies any alteration in mental status or confusion.  He denies any fevers or chills or  sweats.    His report any chest pain, shortness of breath, palpitation, orthopnea.  He does not report any cough or wheezing or hemoptysis. He has not reported any nausea, vomiting,  diarrhea or change in his bowel habits.  He denies any hematochezia or melena.  He does not report any arthralgias or myalgias. He has not observed any lymphadenopathy or petechiae.  He denies any skin rashes or lesions.  He denies any changes in his mood.  He denies any bleeding or clotting tendencies.  Remainder of his review of systems is negative.   Medications: I have reviewed the patient's current medications.  Current Outpatient Medications  Medication Sig Dispense Refill  . lidocaine-prilocaine (EMLA) cream Apply 1 application topically as needed. 30 g 0  . ondansetron (ZOFRAN) 8 MG tablet Take 1 tablet (8 mg total) by mouth every 8 (eight) hours as needed for nausea or vomiting. (Patient not taking: Reported on 07/29/2014) 20 tablet 0  . oxyCODONE-acetaminophen (PERCOCET) 5-325 MG tablet Take 1-2 tablets by mouth every 6 (six) hours as needed for moderate pain or severe pain. 30 tablet 0  . prochlorperazine (COMPAZINE) 10 MG tablet Take 1 tablet (10 mg total) by mouth every 6 (six) hours as needed for nausea or vomiting. 30 tablet 0   No current facility-administered medications for this visit.    Facility-Administered Medications Ordered in Other Visits  Medication Dose Route Frequency Provider Last Rate Last Dose  . sodium chloride flush (NS) 0.9 % injection 10 mL  10 mL Intracatheter Once Wyatt Portela, MD         Allergies: No Known Allergies  Past Medical History, Surgical history, Social history, and Family History were reviewed and updated.   Physical Exam: Blood pressure (!) 153/87, pulse 77, temperature 98.4 F (36.9 C), temperature source Oral, resp. rate 17, height 6' (1.829 m), weight 201 lb 9.6 oz (91.4 kg), SpO2 100 %.    ECOG: 0  General appearance: Well-appearing gentleman  appeared comfortable. Head: Normocephalic without any abnormalities. Oropharynx: No oral thrush or ulcers. Eyes: Pupils are equal and round reactive to light. Lymph nodes: No cervical, supraclavicular, inguinal or axillary lymphadenopathy. Heart:regular rate without any murmurs or gallops.  No leg edema. Lung:clear in all lung fields without any dullness to percussion or.  No wheezing or rhonchi. Abdomin: Soft, nondistended with good bowel sounds.  Nontender without any fitting dullness or ascites. Skeletal skeletal: Full range of motion noted all joints. Neurological: No motor or sensory deficits.  Intact deep tendon reflexes. Skin: No ecchymosis or petechiae.  Skin appeared moist. Psychiatric: Appropriate mood and affect.  Lab Results: Lab Results  Component Value Date   WBC 7.1 02/20/2018   HGB 16.1 02/20/2018   HCT 45.6 02/20/2018   MCV 84.6 02/20/2018   PLT 215 02/20/2018     Chemistry      Component Value Date/Time   NA 138 02/17/2018 1500   NA 139 08/05/2014 1335   K 3.7 02/17/2018 1500   K 4.3 08/05/2014 1335   CL 103 02/17/2018 1500   CO2 25 02/17/2018 1500   CO2 26 08/05/2014 1335   BUN 17 02/17/2018 1500   BUN 18.2 08/05/2014 1335   CREATININE 1.28 02/17/2018 1500   CREATININE 1.1 08/05/2014 1335      Component Value Date/Time   CALCIUM 9.4 02/17/2018 1500   CALCIUM 9.5 08/05/2014 1335   ALKPHOS 118 02/17/2018 1500   ALKPHOS 98 08/05/2014 1335   AST 31 02/17/2018 1500   AST 45 (H) 08/05/2014 1335   ALT 41 02/17/2018 1500   ALT 73 (H) 08/05/2014 1335   BILITOT 0.6 02/17/2018 1500   BILITOT 0.47 08/05/2014 1335         Impression and Plan:  67 year old man with:    1.  Stage IV bladder cancer documented in May 2019.  He initially was found to have high-grade, transitional cell carcinoma of the bladder diagnosed in September 2015.    He is currently receiving palliative chemotherapy utilizing carboplatin and gemcitabine and was started on  02/17/2018.  The natural course of this disease was reviewed today with the patient and his wife that was present on the phone.  Imaging studies of his abdomen and pelvis were reviewed with the patient including review of the images.  The role of systemic chemotherapy as well as immunotherapy was reviewed today in detail.  2. Renal insufficiency: His creatinine remains stable at this time and will continue to monitor as he is receiving platinum therapy.  3. Antiemetics: Antiemetics is available to him at this time.  No nausea or vomiting reported at this time.  4.  IV access: Port-A-Cath inserted without any complications.  5.  Pain: Percocet was refilled to him and to use as needed.  6.  Dysphagia and pain in his sternum: Unclear etiology which is likely related to malignancy.  I will obtain imaging studies of the chest to rule out pulmonary metastasis in the immediate future.  He might require an endoscopy if the symptoms continue.  7.  Prognosis: The goal of therapy remains palliative.  His performance status is excellent and aggressive  therapy is warranted.  8. Followup: Will be in 2 weeks for day 1 of cycle 2 of chemotherapy.  25  minutes was spent with the patient face-to-face today.  More than 50% of time was dedicated to patient counseling, education and discussing the natural course of this disease and future plan of care.   [name], MD 6/25/20198:33 AM

## 2018-02-25 ENCOUNTER — Telehealth: Payer: Self-pay | Admitting: Oncology

## 2018-02-25 NOTE — Telephone Encounter (Signed)
Faxed medical records to [facility] @ [phone]. Release ID# [id number]

## 2018-03-10 ENCOUNTER — Inpatient Hospital Stay (HOSPITAL_BASED_OUTPATIENT_CLINIC_OR_DEPARTMENT_OTHER): Payer: 59 | Admitting: Nurse Practitioner

## 2018-03-10 ENCOUNTER — Telehealth: Payer: Self-pay

## 2018-03-10 ENCOUNTER — Encounter: Payer: Self-pay | Admitting: Nurse Practitioner

## 2018-03-10 ENCOUNTER — Inpatient Hospital Stay: Payer: 59 | Attending: Oncology

## 2018-03-10 ENCOUNTER — Inpatient Hospital Stay: Payer: 59

## 2018-03-10 VITALS — BP 125/63 | HR 68 | Temp 98.2°F | Resp 18 | Ht 72.0 in | Wt 202.8 lb

## 2018-03-10 DIAGNOSIS — Z9221 Personal history of antineoplastic chemotherapy: Secondary | ICD-10-CM | POA: Insufficient documentation

## 2018-03-10 DIAGNOSIS — C775 Secondary and unspecified malignant neoplasm of intrapelvic lymph nodes: Secondary | ICD-10-CM | POA: Insufficient documentation

## 2018-03-10 DIAGNOSIS — Z452 Encounter for adjustment and management of vascular access device: Secondary | ICD-10-CM | POA: Diagnosis present

## 2018-03-10 DIAGNOSIS — Z8551 Personal history of malignant neoplasm of bladder: Secondary | ICD-10-CM | POA: Diagnosis not present

## 2018-03-10 DIAGNOSIS — R740 Nonspecific elevation of levels of transaminase and lactic acid dehydrogenase [LDH]: Secondary | ICD-10-CM | POA: Diagnosis not present

## 2018-03-10 DIAGNOSIS — G893 Neoplasm related pain (acute) (chronic): Secondary | ICD-10-CM | POA: Insufficient documentation

## 2018-03-10 DIAGNOSIS — C679 Malignant neoplasm of bladder, unspecified: Secondary | ICD-10-CM | POA: Insufficient documentation

## 2018-03-10 DIAGNOSIS — Z5111 Encounter for antineoplastic chemotherapy: Secondary | ICD-10-CM | POA: Insufficient documentation

## 2018-03-10 DIAGNOSIS — R1310 Dysphagia, unspecified: Secondary | ICD-10-CM | POA: Insufficient documentation

## 2018-03-10 DIAGNOSIS — R072 Precordial pain: Secondary | ICD-10-CM | POA: Diagnosis not present

## 2018-03-10 DIAGNOSIS — Z79899 Other long term (current) drug therapy: Secondary | ICD-10-CM | POA: Diagnosis not present

## 2018-03-10 DIAGNOSIS — N289 Disorder of kidney and ureter, unspecified: Secondary | ICD-10-CM | POA: Diagnosis not present

## 2018-03-10 DIAGNOSIS — Z95828 Presence of other vascular implants and grafts: Secondary | ICD-10-CM

## 2018-03-10 LAB — CMP (CANCER CENTER ONLY)
ALT: 137 U/L — ABNORMAL HIGH (ref 0–44)
AST: 96 U/L — ABNORMAL HIGH (ref 15–41)
Albumin: 3.8 g/dL (ref 3.5–5.0)
Alkaline Phosphatase: 101 U/L (ref 38–126)
Anion gap: 5 (ref 5–15)
BUN: 15 mg/dL (ref 8–23)
CO2: 28 mmol/L (ref 22–32)
Calcium: 8.8 mg/dL — ABNORMAL LOW (ref 8.9–10.3)
Chloride: 104 mmol/L (ref 98–111)
Creatinine: 1.25 mg/dL — ABNORMAL HIGH (ref 0.61–1.24)
GFR, Est AFR Am: >60 mL/min (ref >60)
GFR, Estimated: 58 mL/min — ABNORMAL LOW (ref >60)
Glucose, Bld: 183 mg/dL — ABNORMAL HIGH (ref 70–99)
Potassium: 4.1 mmol/L (ref 3.5–5.1)
Sodium: 137 mmol/L (ref 135–145)
Total Bilirubin: 0.5 mg/dL (ref 0.3–1.2)
Total Protein: 6.9 g/dL (ref 6.5–8.1)

## 2018-03-10 LAB — CBC WITH DIFFERENTIAL (CANCER CENTER ONLY)
Basophils Absolute: 0 10*3/uL (ref 0.0–0.1)
Basophils Relative: 0 %
Eosinophils Absolute: 0.1 10*3/uL (ref 0.0–0.5)
Eosinophils Relative: 2 %
HCT: 36.6 % — ABNORMAL LOW (ref 38.4–49.9)
Hemoglobin: 13.2 g/dL (ref 13.0–17.1)
Lymphocytes Relative: 44 %
Lymphs Abs: 1.5 10*3/uL (ref 0.9–3.3)
MCH: 29.5 pg (ref 27.2–33.4)
MCHC: 36.1 g/dL — ABNORMAL HIGH (ref 32.0–36.0)
MCV: 81.7 fL (ref 79.3–98.0)
Monocytes Absolute: 0.5 10*3/uL (ref 0.1–0.9)
Monocytes Relative: 15 %
Neutro Abs: 1.4 10*3/uL — ABNORMAL LOW (ref 1.5–6.5)
Neutrophils Relative %: 39 %
Platelet Count: 381 10*3/uL (ref 140–400)
RBC: 4.48 MIL/uL (ref 4.20–5.82)
RDW: 12.9 % (ref 11.0–14.6)
WBC Count: 3.5 10*3/uL — ABNORMAL LOW (ref 4.0–10.3)

## 2018-03-10 MED ORDER — PALONOSETRON HCL INJECTION 0.25 MG/5ML
INTRAVENOUS | Status: AC
  Filled 2018-03-10: qty 5

## 2018-03-10 MED ORDER — DEXAMETHASONE SODIUM PHOSPHATE 10 MG/ML IJ SOLN
10.0000 mg | Freq: Once | INTRAMUSCULAR | Status: AC
  Administered 2018-03-10: 10 mg via INTRAVENOUS

## 2018-03-10 MED ORDER — SODIUM CHLORIDE 0.9% FLUSH
10.0000 mL | INTRAVENOUS | Status: DC | PRN
  Administered 2018-03-10: 10 mL
  Filled 2018-03-10: qty 10

## 2018-03-10 MED ORDER — HEPARIN SOD (PORK) LOCK FLUSH 100 UNIT/ML IV SOLN
500.0000 [IU] | Freq: Once | INTRAVENOUS | Status: AC | PRN
  Administered 2018-03-10: 500 [IU]
  Filled 2018-03-10: qty 5

## 2018-03-10 MED ORDER — PALONOSETRON HCL INJECTION 0.25 MG/5ML
0.2500 mg | Freq: Once | INTRAVENOUS | Status: AC
  Administered 2018-03-10: 0.25 mg via INTRAVENOUS

## 2018-03-10 MED ORDER — DEXAMETHASONE SODIUM PHOSPHATE 10 MG/ML IJ SOLN
INTRAMUSCULAR | Status: AC
  Filled 2018-03-10: qty 1

## 2018-03-10 MED ORDER — SODIUM CHLORIDE 0.9 % IV SOLN
506.0000 mg | Freq: Once | INTRAVENOUS | Status: AC
  Administered 2018-03-10: 510 mg via INTRAVENOUS
  Filled 2018-03-10: qty 51

## 2018-03-10 MED ORDER — SODIUM CHLORIDE 0.9 % IV SOLN
Freq: Once | INTRAVENOUS | Status: AC
  Administered 2018-03-10: 16:00:00 via INTRAVENOUS

## 2018-03-10 MED ORDER — SODIUM CHLORIDE 0.9% FLUSH
10.0000 mL | Freq: Once | INTRAVENOUS | Status: AC
  Administered 2018-03-10: 10 mL
  Filled 2018-03-10: qty 10

## 2018-03-10 MED ORDER — SODIUM CHLORIDE 0.9 % IV SOLN
1000.0000 mg/m2 | Freq: Once | INTRAVENOUS | Status: AC
  Administered 2018-03-10: 2204 mg via INTRAVENOUS
  Filled 2018-03-10: qty 57.97

## 2018-03-10 NOTE — Progress Notes (Signed)
Per Santiago Glad, RN for Lattie Haw NP, okay to treat pt with abnormal labs.

## 2018-03-10 NOTE — Telephone Encounter (Signed)
Pt CT scan not yet authorized per Anderson Malta with [facility]. Scan rescheduled for 03/18/18 at 1330. Pt notified of change and to remain NPO 4 hours prior. Per MD, no chest xray at this time. Would like to continue persuing insurance authorization for the CT scan. Left VM with Anderson Malta at [facility] [phone] [phone] to call back and notify of any reason why the scan has not or will not be authorized [phone].

## 2018-03-10 NOTE — Progress Notes (Signed)
[place] OFFICE PROGRESS NOTE   Principle Diagnosis: 67 year old man with stage IV bladder cancer documented in May 2019 with pelvic adenopathy.  He was initially diagnosed with T2N0 high-grade, muscle invasive transitional cell carcinoma of the bladder in September 2015.    Prior Therapy:  He underwent a cystoscopy and a TURBT on 05/17/2014.   Neoadjuvant systemic chemotherapy in the form of gemcitabine and cisplatin cycle 1 day 1 is on 06/17/2014. He is S/P two cycles completed in 07/2014.  Therapy tolerated poorly with symptoms of nausea and vomiting and worsening renal function.  He is status post robotic cystoprostatectomy and bilateral lymphadenectomy completed on September 09, 2014.  The final pathology revealed T2N0 without any lymph node involvement.  He developed pelvic adenopathy that was biopsy-proven on Jan 14, 2018 to be metastatic urothelial carcinoma.  Current therapy: Carboplatin and gemcitabine cycle 1 started on 02/17/2018.      INTERVAL HISTORY:   Mr. Myrie returns as scheduled.  He completed cycle 1 day 1 carboplatin/gemcitabine 02/17/2018.  He completed day 8 gemcitabine 02/24/2018.  He denies significant nausea.  No mouth sores.  He had a single episode of loose stool.  He thinks this was diet related.  No rash.  No fever.  He continues to have pain at the low mid chest/upper abdomen.  He takes Percocet as needed with partial relief.  Appetite diminished.  Objective:  Vital signs in last 24 hours:  Blood pressure 125/63, pulse 68, temperature 98.2 F (36.8 C), temperature source Oral, resp. rate 18, height 6' (1.829 m), weight 202 lb 12.8 oz (92 kg), SpO2 99 %.    HEENT: No thrush or ulcers. Resp: Lungs clear bilaterally. Cardio: Regular rate and rhythm. GI: Abdomen is soft.  Tendern over the mid upper abdomen.  No hepatomegaly. Vascular: No leg edema. Neuro: Alert and oriented. Skin: No rash. Musculoskeletal: Tender over the low  sternum. Port-A-Cath without erythema.   Lab Results:  Lab Results  Component Value Date   WBC 7.1 02/20/2018   HGB 16.1 02/20/2018   HCT 45.6 02/20/2018   MCV 84.6 02/20/2018   PLT 215 02/20/2018   NEUTROABS 5.2 02/20/2018    Imaging:  No results found.  Medications: I have reviewed the patient's current medications.  Assessment/Plan: 1. Stage IV bladder cancer currently on active treatment with carboplatin/gemcitabine status post 1 cycle. 2. Renal insufficiency.   3. IV access.  He has a Port-A-Cath. 4. Pain.  He is taking Percocet as needed for pain at the low sternum/upper abdomen.  He has been referred for a chest CT.  Disposition: Mr. Oberman appears stable.  He has completed 1 cycle of carboplatin/gemcitabine.  Overall he seems to be tolerating well.  Plan to proceed with cycle 2-day 1 today as scheduled.  I reviewed today's labs with Dr. Alen Blew.  The [place] is mildly decreased.  Mr. Kirksey understands to contact the office with fever, chills, other signs of infection.  Transaminases are elevated.  This may be related to the gemcitabine.  Plan to continue to monitor.  He continues to have pain at the low sternum/upper abdomen.  He has been referred for a chest CT.  He will continue Percocet as needed.  He will return in 1 week for the day 8 gemcitabine.  He will return for lab, follow-up and cycle 3 carboplatin/gemcitabine in 3 weeks.  He will contact the office in the interim with any problems.   Ned Card ANP/GNP-BC   03/10/2018  3:00 PM

## 2018-03-10 NOTE — Patient Instructions (Signed)
[facility] Discharge Instructions for Patients Receiving Chemotherapy  Today you received the following chemotherapy agents Gemzar and Carboplain  To help prevent nausea and vomiting after your treatment, we encourage you to take your nausea medication as directed   If you develop nausea and vomiting that is not controlled by your nausea medication, call the clinic.   BELOW ARE SYMPTOMS THAT SHOULD BE REPORTED IMMEDIATELY:  *FEVER GREATER THAN 100.5 F  *CHILLS WITH OR WITHOUT FEVER  NAUSEA AND VOMITING THAT IS NOT CONTROLLED WITH YOUR NAUSEA MEDICATION  *UNUSUAL SHORTNESS OF BREATH  *UNUSUAL BRUISING OR BLEEDING  TENDERNESS IN MOUTH AND THROAT WITH OR WITHOUT PRESENCE OF ULCERS  *URINARY PROBLEMS  *BOWEL PROBLEMS  UNUSUAL RASH Items with * indicate a potential emergency and should be followed up as soon as possible.  Feel free to call the clinic should you have any questions or concerns. The clinic phone number is [phone].  Please show the [place] at check-in to the Emergency Department and triage nurse.

## 2018-03-11 ENCOUNTER — Ambulatory Visit (HOSPITAL_COMMUNITY): Admission: RE | Admit: 2018-03-11 | Payer: 59 | Source: Ambulatory Visit

## 2018-03-17 ENCOUNTER — Other Ambulatory Visit: Payer: Self-pay | Admitting: *Deleted

## 2018-03-17 ENCOUNTER — Other Ambulatory Visit: Payer: Self-pay | Admitting: Oncology

## 2018-03-17 ENCOUNTER — Inpatient Hospital Stay: Payer: 59

## 2018-03-17 VITALS — BP 142/77 | HR 70 | Temp 97.5°F | Resp 18

## 2018-03-17 DIAGNOSIS — C679 Malignant neoplasm of bladder, unspecified: Secondary | ICD-10-CM

## 2018-03-17 DIAGNOSIS — Z95828 Presence of other vascular implants and grafts: Secondary | ICD-10-CM

## 2018-03-17 DIAGNOSIS — Z5111 Encounter for antineoplastic chemotherapy: Secondary | ICD-10-CM | POA: Diagnosis not present

## 2018-03-17 LAB — CBC WITH DIFFERENTIAL (CANCER CENTER ONLY)
Basophils Absolute: 0 10*3/uL (ref 0.0–0.1)
Basophils Relative: 1 %
Eosinophils Absolute: 0 10*3/uL (ref 0.0–0.5)
Eosinophils Relative: 2 %
HCT: 18.3 % — ABNORMAL LOW (ref 38.4–49.9)
Hemoglobin: 6.4 g/dL — CL (ref 13.0–17.1)
Lymphocytes Relative: 63 %
Lymphs Abs: 0.7 10*3/uL — ABNORMAL LOW (ref 0.9–3.3)
MCH: 28.6 pg (ref 27.2–33.4)
MCHC: 35 g/dL (ref 32.0–36.0)
MCV: 81.7 fL (ref 79.3–98.0)
Monocytes Absolute: 0.1 10*3/uL (ref 0.1–0.9)
Monocytes Relative: 8 %
Neutro Abs: 0.3 10*3/uL — CL (ref 1.5–6.5)
Neutrophils Relative %: 26 %
Platelet Count: 107 10*3/uL — ABNORMAL LOW (ref 140–400)
RBC: 2.24 MIL/uL — ABNORMAL LOW (ref 4.20–5.82)
RDW: 12.5 % (ref 11.0–14.6)
WBC Count: 1.1 10*3/uL — ABNORMAL LOW (ref 4.0–10.3)

## 2018-03-17 LAB — CMP (CANCER CENTER ONLY)
ALT: 217 U/L — ABNORMAL HIGH (ref 0–44)
AST: 76 U/L — ABNORMAL HIGH (ref 15–41)
Albumin: 3.9 g/dL (ref 3.5–5.0)
Alkaline Phosphatase: 106 U/L (ref 38–126)
Anion gap: 6 (ref 5–15)
BUN: 28 mg/dL — ABNORMAL HIGH (ref 8–23)
CO2: 28 mmol/L (ref 22–32)
Calcium: 9.3 mg/dL (ref 8.9–10.3)
Chloride: 100 mmol/L (ref 98–111)
Creatinine: 1.36 mg/dL — ABNORMAL HIGH (ref 0.61–1.24)
GFR, Est AFR Am: >60 mL/min (ref >60)
GFR, Estimated: 53 mL/min — ABNORMAL LOW (ref >60)
Glucose, Bld: 228 mg/dL — ABNORMAL HIGH (ref 70–99)
Potassium: 4.2 mmol/L (ref 3.5–5.1)
Sodium: 134 mmol/L — ABNORMAL LOW (ref 135–145)
Total Bilirubin: 0.4 mg/dL (ref 0.3–1.2)
Total Protein: 7.1 g/dL (ref 6.5–8.1)

## 2018-03-17 LAB — ABO/RH: ABO/RH(D): O POS

## 2018-03-17 LAB — PREPARE RBC (CROSSMATCH)

## 2018-03-17 MED ORDER — PROCHLORPERAZINE MALEATE 10 MG PO TABS
ORAL_TABLET | ORAL | Status: AC
  Filled 2018-03-17: qty 1

## 2018-03-17 MED ORDER — HEPARIN SOD (PORK) LOCK FLUSH 100 UNIT/ML IV SOLN
500.0000 [IU] | Freq: Once | INTRAVENOUS | Status: AC
  Administered 2018-03-17: 500 [IU]
  Filled 2018-03-17: qty 5

## 2018-03-17 MED ORDER — SODIUM CHLORIDE 0.9% FLUSH
10.0000 mL | Freq: Once | INTRAVENOUS | Status: AC
  Administered 2018-03-17: 10 mL
  Filled 2018-03-17: qty 10

## 2018-03-17 NOTE — Patient Instructions (Signed)
[facility] Discharge Instructions for Patients Receiving Chemotherapy  Today you received the following chemotherapy agents gemcitabine (Gemzar)  To help prevent nausea and vomiting after your treatment, we encourage you to take your nausea medication as directed   If you develop nausea and vomiting that is not controlled by your nausea medication, call the clinic.   BELOW ARE SYMPTOMS THAT SHOULD BE REPORTED IMMEDIATELY:  *FEVER GREATER THAN 100.5 F  *CHILLS WITH OR WITHOUT FEVER  NAUSEA AND VOMITING THAT IS NOT CONTROLLED WITH YOUR NAUSEA MEDICATION  *UNUSUAL SHORTNESS OF BREATH  *UNUSUAL BRUISING OR BLEEDING  TENDERNESS IN MOUTH AND THROAT WITH OR WITHOUT PRESENCE OF ULCERS  *URINARY PROBLEMS  *BOWEL PROBLEMS  UNUSUAL RASH Items with * indicate a potential emergency and should be followed up as soon as possible.  Feel free to call the clinic should you have any questions or concerns. The clinic phone number is [phone].  Please show the CHEMO ALERT CARD at check-in to the Emergency Department and triage nurse.   

## 2018-03-17 NOTE — Progress Notes (Signed)
Pt Hgb 6.4. No treatment today per Dr. Alen Blew. Pt to get type and screen. Set up for blood later in the week by desk nurse. Pt asymptomatic upon d/c.

## 2018-03-18 ENCOUNTER — Inpatient Hospital Stay: Payer: 59

## 2018-03-18 ENCOUNTER — Ambulatory Visit (HOSPITAL_COMMUNITY): Payer: 59

## 2018-03-18 DIAGNOSIS — C679 Malignant neoplasm of bladder, unspecified: Secondary | ICD-10-CM

## 2018-03-18 DIAGNOSIS — Z5111 Encounter for antineoplastic chemotherapy: Secondary | ICD-10-CM | POA: Diagnosis not present

## 2018-03-18 MED ORDER — ACETAMINOPHEN 325 MG PO TABS
650.0000 mg | ORAL_TABLET | Freq: Once | ORAL | Status: AC
  Administered 2018-03-18: 650 mg via ORAL

## 2018-03-18 MED ORDER — HEPARIN SOD (PORK) LOCK FLUSH 100 UNIT/ML IV SOLN
500.0000 [IU] | Freq: Every day | INTRAVENOUS | Status: AC | PRN
  Administered 2018-03-18: 500 [IU]
  Filled 2018-03-18: qty 5

## 2018-03-18 MED ORDER — DIPHENHYDRAMINE HCL 25 MG PO CAPS
25.0000 mg | ORAL_CAPSULE | Freq: Once | ORAL | Status: AC
  Administered 2018-03-18: 25 mg via ORAL

## 2018-03-18 MED ORDER — SODIUM CHLORIDE 0.9% FLUSH
10.0000 mL | INTRAVENOUS | Status: AC | PRN
  Administered 2018-03-18: 10 mL
  Filled 2018-03-18: qty 10

## 2018-03-18 MED ORDER — ACETAMINOPHEN 325 MG PO TABS
ORAL_TABLET | ORAL | Status: AC
  Filled 2018-03-18: qty 2

## 2018-03-18 MED ORDER — SODIUM CHLORIDE 0.9 % IV SOLN
250.0000 mL | Freq: Once | INTRAVENOUS | Status: AC
  Administered 2018-03-18: 250 mL via INTRAVENOUS

## 2018-03-18 MED ORDER — DIPHENHYDRAMINE HCL 25 MG PO CAPS
ORAL_CAPSULE | ORAL | Status: AC
  Filled 2018-03-18: qty 1

## 2018-03-18 NOTE — Patient Instructions (Signed)

## 2018-03-18 NOTE — Progress Notes (Signed)
Pt rescheduled for CT scan tomorrow at 1200. Flush appt at [place]. Pt aware of appts and to remain NPO except for clear liquids for 4 hours prior to scan. Power port to be used and noted in appt notes for flush.

## 2018-03-19 ENCOUNTER — Ambulatory Visit (HOSPITAL_COMMUNITY)
Admission: RE | Admit: 2018-03-19 | Discharge: 2018-03-19 | Disposition: A | Discharge status: Home / Self Care | Payer: 59 | Source: Ambulatory Visit | Attending: Oncology | Admitting: Oncology

## 2018-03-19 ENCOUNTER — Encounter (HOSPITAL_COMMUNITY): Payer: Self-pay

## 2018-03-19 DIAGNOSIS — R937 Abnormal findings on diagnostic imaging of other parts of musculoskeletal system: Secondary | ICD-10-CM | POA: Diagnosis not present

## 2018-03-19 DIAGNOSIS — C679 Malignant neoplasm of bladder, unspecified: Secondary | ICD-10-CM | POA: Diagnosis not present

## 2018-03-19 DIAGNOSIS — I700 Atherosclerosis of aorta: Secondary | ICD-10-CM | POA: Diagnosis not present

## 2018-03-19 DIAGNOSIS — R918 Other nonspecific abnormal finding of lung field: Secondary | ICD-10-CM | POA: Diagnosis not present

## 2018-03-19 DIAGNOSIS — R071 Chest pain on breathing: Secondary | ICD-10-CM

## 2018-03-19 LAB — TYPE AND SCREEN
ABO/RH(D): O POS
Antibody Screen: NEGATIVE
Unit division: 0
Unit division: 0

## 2018-03-19 LAB — BPAM RBC
Blood Product Expiration Date: 201908122359
Blood Product Expiration Date: 201908142359
ISSUE DATE / TIME: 201907171109
ISSUE DATE / TIME: 201907171109
Unit Type and Rh: 5100
Unit Type and Rh: 5100

## 2018-03-19 MED ORDER — HEPARIN SOD (PORK) LOCK FLUSH 100 UNIT/ML IV SOLN
500.0000 [IU] | Freq: Once | INTRAVENOUS | Status: AC
  Administered 2018-03-19: 500 [IU] via INTRAVENOUS

## 2018-03-19 MED ORDER — IOHEXOL 300 MG/ML  SOLN
75.0000 mL | Freq: Once | INTRAMUSCULAR | Status: AC | PRN
  Administered 2018-03-19: 75 mL via INTRAVENOUS

## 2018-03-31 ENCOUNTER — Inpatient Hospital Stay: Payer: 59

## 2018-03-31 ENCOUNTER — Other Ambulatory Visit: Payer: Self-pay | Admitting: *Deleted

## 2018-03-31 ENCOUNTER — Other Ambulatory Visit: Payer: 59

## 2018-03-31 ENCOUNTER — Telehealth: Payer: Self-pay | Admitting: Oncology

## 2018-03-31 ENCOUNTER — Inpatient Hospital Stay (HOSPITAL_BASED_OUTPATIENT_CLINIC_OR_DEPARTMENT_OTHER): Payer: 59 | Admitting: Oncology

## 2018-03-31 ENCOUNTER — Ambulatory Visit: Payer: 59 | Admitting: Oncology

## 2018-03-31 ENCOUNTER — Ambulatory Visit: Payer: 59

## 2018-03-31 VITALS — BP 146/87 | HR 62 | Temp 98.1°F | Resp 18 | Ht 72.0 in | Wt 205.6 lb

## 2018-03-31 DIAGNOSIS — Z79899 Other long term (current) drug therapy: Secondary | ICD-10-CM

## 2018-03-31 DIAGNOSIS — Z5111 Encounter for antineoplastic chemotherapy: Secondary | ICD-10-CM | POA: Diagnosis not present

## 2018-03-31 DIAGNOSIS — C775 Secondary and unspecified malignant neoplasm of intrapelvic lymph nodes: Secondary | ICD-10-CM

## 2018-03-31 DIAGNOSIS — N289 Disorder of kidney and ureter, unspecified: Secondary | ICD-10-CM

## 2018-03-31 DIAGNOSIS — C679 Malignant neoplasm of bladder, unspecified: Secondary | ICD-10-CM

## 2018-03-31 DIAGNOSIS — R1310 Dysphagia, unspecified: Secondary | ICD-10-CM

## 2018-03-31 DIAGNOSIS — Z8551 Personal history of malignant neoplasm of bladder: Secondary | ICD-10-CM

## 2018-03-31 DIAGNOSIS — Z95828 Presence of other vascular implants and grafts: Secondary | ICD-10-CM

## 2018-03-31 DIAGNOSIS — R072 Precordial pain: Secondary | ICD-10-CM | POA: Diagnosis not present

## 2018-03-31 DIAGNOSIS — R740 Nonspecific elevation of levels of transaminase and lactic acid dehydrogenase [LDH]: Secondary | ICD-10-CM

## 2018-03-31 DIAGNOSIS — G893 Neoplasm related pain (acute) (chronic): Secondary | ICD-10-CM

## 2018-03-31 DIAGNOSIS — Z9221 Personal history of antineoplastic chemotherapy: Secondary | ICD-10-CM

## 2018-03-31 DIAGNOSIS — C670 Malignant neoplasm of trigone of bladder: Secondary | ICD-10-CM

## 2018-03-31 LAB — CBC WITH DIFFERENTIAL/PLATELET
Basophils Absolute: 0 10*3/uL (ref 0.0–0.1)
Basophils Relative: 0 %
Eosinophils Absolute: 0.1 10*3/uL (ref 0.0–0.5)
Eosinophils Relative: 1 %
HCT: 40.8 % (ref 38.4–49.9)
Hemoglobin: 14.3 g/dL (ref 13.0–17.1)
Lymphocytes Relative: 28 %
Lymphs Abs: 1.3 10*3/uL (ref 0.9–3.3)
MCH: 29.4 pg (ref 27.2–33.4)
MCHC: 35 g/dL (ref 32.0–36.0)
MCV: 84 fL (ref 79.3–98.0)
Monocytes Absolute: 1 10*3/uL — ABNORMAL HIGH (ref 0.1–0.9)
Monocytes Relative: 22 %
Neutro Abs: 2.3 10*3/uL (ref 1.5–6.5)
Neutrophils Relative %: 49 %
Platelets: 176 10*3/uL (ref 140–400)
RBC: 4.86 MIL/uL (ref 4.20–5.82)
RDW: 14.3 % (ref 11.0–14.6)
WBC: 4.7 10*3/uL (ref 4.0–10.3)

## 2018-03-31 LAB — CMP (CANCER CENTER ONLY)
ALT: 75 U/L — ABNORMAL HIGH (ref 0–44)
AST: 46 U/L — ABNORMAL HIGH (ref 15–41)
Albumin: 3.8 g/dL (ref 3.5–5.0)
Alkaline Phosphatase: 113 U/L (ref 38–126)
Anion gap: 7 (ref 5–15)
BUN: 18 mg/dL (ref 8–23)
CO2: 28 mmol/L (ref 22–32)
Calcium: 8.8 mg/dL — ABNORMAL LOW (ref 8.9–10.3)
Chloride: 104 mmol/L (ref 98–111)
Creatinine: 1.05 mg/dL (ref 0.61–1.24)
GFR, Est AFR Am: >60 mL/min (ref >60)
GFR, Estimated: >60 mL/min (ref >60)
Glucose, Bld: 103 mg/dL — ABNORMAL HIGH (ref 70–99)
Potassium: 4.5 mmol/L (ref 3.5–5.1)
Sodium: 139 mmol/L (ref 135–145)
Total Bilirubin: 0.4 mg/dL (ref 0.3–1.2)
Total Protein: 6.9 g/dL (ref 6.5–8.1)

## 2018-03-31 MED ORDER — OXYCODONE-ACETAMINOPHEN 5-325 MG PO TABS: 1.0000 | ORAL_TABLET | Freq: Four times a day (QID) | ORAL | 0 refills | Status: DC | PRN

## 2018-03-31 MED ORDER — LIDOCAINE-PRILOCAINE 2.5-2.5 % EX CREA: 1.0000 "application " | TOPICAL_CREAM | CUTANEOUS | 0 refills | Status: DC | PRN

## 2018-03-31 MED ORDER — SODIUM CHLORIDE 0.9 % IV SOLN
Freq: Once | INTRAVENOUS | Status: AC
  Administered 2018-03-31: 15:00:00 via INTRAVENOUS
  Filled 2018-03-31: qty 250

## 2018-03-31 MED ORDER — PALONOSETRON HCL INJECTION 0.25 MG/5ML
0.2500 mg | Freq: Once | INTRAVENOUS | Status: AC
  Administered 2018-03-31: 0.25 mg via INTRAVENOUS

## 2018-03-31 MED ORDER — DEXAMETHASONE SODIUM PHOSPHATE 10 MG/ML IJ SOLN
10.0000 mg | Freq: Once | INTRAMUSCULAR | Status: AC
  Administered 2018-03-31: 10 mg via INTRAVENOUS

## 2018-03-31 MED ORDER — SODIUM CHLORIDE 0.9 % IV SOLN
1000.0000 mg/m2 | Freq: Once | INTRAVENOUS | Status: AC
  Administered 2018-03-31: 2204 mg via INTRAVENOUS
  Filled 2018-03-31: qty 57.97

## 2018-03-31 MED ORDER — PALONOSETRON HCL INJECTION 0.25 MG/5ML
INTRAVENOUS | Status: AC
  Filled 2018-03-31: qty 5

## 2018-03-31 MED ORDER — SODIUM CHLORIDE 0.9 % IV SOLN
590.0000 mg | Freq: Once | INTRAVENOUS | Status: AC
  Administered 2018-03-31: 590 mg via INTRAVENOUS
  Filled 2018-03-31: qty 59

## 2018-03-31 MED ORDER — DEXAMETHASONE SODIUM PHOSPHATE 10 MG/ML IJ SOLN
INTRAMUSCULAR | Status: AC
  Filled 2018-03-31: qty 1

## 2018-03-31 MED ORDER — HEPARIN SOD (PORK) LOCK FLUSH 100 UNIT/ML IV SOLN
500.0000 [IU] | Freq: Once | INTRAVENOUS | Status: AC | PRN
  Administered 2018-03-31: 500 [IU]
  Filled 2018-03-31: qty 5

## 2018-03-31 MED ORDER — SODIUM CHLORIDE 0.9% FLUSH
10.0000 mL | INTRAVENOUS | Status: DC | PRN
  Administered 2018-03-31: 10 mL
  Filled 2018-03-31: qty 10

## 2018-03-31 MED ORDER — SODIUM CHLORIDE 0.9% FLUSH
10.0000 mL | Freq: Once | INTRAVENOUS | Status: AC
  Administered 2018-03-31: 10 mL
  Filled 2018-03-31: qty 10

## 2018-03-31 NOTE — Progress Notes (Signed)
Hematology and Oncology Follow Up Visit  Danny Wood [id number] October 06, 1950 67 y.o. 03/31/2018 1:26 PM Danny Wood, Danny Brow, MD   Principle Diagnosis: 67 year old man with transitional cell carcinoma of the bladder diagnosed in September 2015 with stage T2N0.  He developed stage IV disease and May 2019 with pelvic adenopathy..    Prior Therapy:  He underwent a cystoscopy and a TURBT on 05/17/2014.   Neoadjuvant systemic chemotherapy in the form of gemcitabine and cisplatin cycle 1 day 1 is on 06/17/2014. He is S/P two cycles completed in 07/2014.  Therapy tolerated poorly with symptoms of nausea and vomiting and worsening renal function.  He is status post robotic cystoprostatectomy and bilateral lymphadenectomy completed on September 09, 2014.  The final pathology revealed T2N0 without any lymph node involvement.  He developed pelvic adenopathy that was biopsy-proven on Jan 14, 2018 to be metastatic urothelial carcinoma.  Current therapy: Carboplatin and gemcitabine cycle 1 started on 02/17/2018.  He completed 2 cycles of therapy and here for cycle 3.  Interim History: [name] is here for a follow-up visit.  Since the last visit, he completed the last cycle of chemotherapy without any major complications.  He did develop pancytopenia that required 2 units of packed red cell transfusion.  Since the transfusion, he has felt very well without any other complaints.  He denies any excessive fatigue, tiredness or shortness of breath.  He continues to have chest wall pain and pain with deep inspiration.  He still able to eat although he does report occasional dysphasia.  His appetite and performance status remains excellent and have gained weight since the last visit.  He does not report any headaches, blurred vision, syncope or seizures.  He denies any dizziness or change in his mood.  He denies any fevers or chills or sweats.  He denies any changes in his appetite and weight.   His report any chest pain, shortness of breath, palpitation, orthopnea.  He does not report any cough or wheezing or hemoptysis. He has not reported any nausea, vomiting.  He denies any early satiety or abdominal distention.  He denies any change in his bowel habits.  He does not report any bone pain or pathological fractures.  He has not observed any lymphadenopathy or petechiae.  He denies any skin irritations or lesions.  He denies any heat or cold intolerance.  Remainder of his review of systems is negative.   Medications: I have reviewed the patient's current medications.  Current Outpatient Medications  Medication Sig Dispense Refill  . lidocaine-prilocaine (EMLA) cream Apply 1 application topically as needed. 30 g 0  . ondansetron (ZOFRAN) 8 MG tablet Take 1 tablet (8 mg total) by mouth every 8 (eight) hours as needed for nausea or vomiting. (Patient not taking: Reported on 03/10/2018) 20 tablet 0  . oxyCODONE-acetaminophen (PERCOCET) 5-325 MG tablet Take 1-2 tablets by mouth every 6 (six) hours as needed for moderate pain or severe pain. 90 tablet 0  . prochlorperazine (COMPAZINE) 10 MG tablet Take 1 tablet (10 mg total) by mouth every 6 (six) hours as needed for nausea or vomiting. (Patient not taking: Reported on 03/10/2018) 30 tablet 0   No current facility-administered medications for this visit.      Allergies: No Known Allergies  Past Medical History, Surgical history, Social history, and Family History were reviewed and updated.   Physical Exam:  Blood pressure (!) 146/87, pulse 62, temperature 98.1 F (36.7 C), temperature source Oral, resp. rate 18, height  6' (1.829 m), weight 205 lb 9.6 oz (93.3 kg), SpO2 98 %.    ECOG: 0  General appearance: Alert, awake gentleman without distress. Head: Normocephalic without normalities. Oropharynx: Mucous membranes are moist and pink. Eyes: Sclera anicteric. Lymph nodes: No lymphadenopathy palpated in the cervical, supraclavicular,  inguinal or axillary regions. Heart:regular rate and rhythm.  S1 and S2 without leg edema. Lung:clear without any rhonchi, wheezes or dullness to percussion.. Abdomin: Soft, without any rebound or guarding.  No shifting dullness or ascites. Skeletal skeletal: No joint deformity or effusion. Neurological: No deficits motor or sensory on exam today. Skin: No skin rashes or lesions. Psychiatric: Changes reported in his mood.  Lab Results: Lab Results  Component Value Date   WBC 1.1 (L) 03/17/2018   HGB 6.4 (LL) 03/17/2018   HCT 18.3 (L) 03/17/2018   MCV 81.7 03/17/2018   PLT 107 (L) 03/17/2018     Chemistry      Component Value Date/Time   NA 134 (L) 03/17/2018 1409   NA 139 08/05/2014 1335   K 4.2 03/17/2018 1409   K 4.3 08/05/2014 1335   CL 100 03/17/2018 1409   CO2 28 03/17/2018 1409   CO2 26 08/05/2014 1335   BUN 28 (H) 03/17/2018 1409   BUN 18.2 08/05/2014 1335   CREATININE 1.36 (H) 03/17/2018 1409   CREATININE 1.1 08/05/2014 1335      Component Value Date/Time   CALCIUM 9.3 03/17/2018 1409   CALCIUM 9.5 08/05/2014 1335   ALKPHOS 106 03/17/2018 1409   ALKPHOS 98 08/05/2014 1335   AST 76 (H) 03/17/2018 1409   AST 45 (H) 08/05/2014 1335   ALT 217 (H) 03/17/2018 1409   ALT 73 (H) 08/05/2014 1335   BILITOT 0.4 03/17/2018 1409   BILITOT 0.47 08/05/2014 1335         Impression and Plan:  67 year old man with:    1.  Bladder cancer diagnosed in September 2015 with early-stage disease and subsequently developed stage IV metastasis in May 2019.  He completed 2 cycles of chemotherapy utilizing carboplatin and gemcitabine with cytopenias that resulted in eliminating of day 8 of cycle 2 of therapy.  CT scan of the chest obtained on 03/19/2018 was personally reviewed and did not show any evidence of obvious thoracic metastasis.  Risks and benefits of continuing chemotherapy was reviewed today and is agreeable to continue.  We will eliminate day 8 of each cycle for  better tolerance and given his recent cytopenias.  After completing 4 cycles of therapy we will reimage his abdomen and pelvis and consider on the duration of therapy at the time.  2. Renal insufficiency: His creatinine slightly elevated I will continue to monitor for future therapy and adjust carboplatin dosing accordingly.  3. Antiemetics: No nausea or vomiting reported.  4.  IV access: Port-A-Cath remains in place without any issues.  5.  Pain: Is manageable with Percocet at this time and this will be refilled.  His pain is predominantly in the pelvic area related to his cancer as well as chest wall pain.  6.  Dysphagia and pain in his sternum: CT scan of the chest obtained on 03/19/2018 showed no acute abnormalities in the chest.  We will consider endoscopy if the symptoms continues to be an issue.  7.  Prognosis: His performance status remains excellent although treatment is palliative in nature.  8. Followup: Will be in 3 weeks for the next cycle of chemotherapy.  25  minutes was spent with the  patient face-to-face today.  More than 50% of time was dedicated to patient education, discussing the natural course of this disease, reviewing imaging studies as well as planning future care plan as well as treatment option discussion.   [name], MD 7/30/20191:26 PM

## 2018-03-31 NOTE — Patient Instructions (Signed)
[facility] Discharge Instructions for Patients Receiving Chemotherapy  Today you received the following chemotherapy agents :  Gemcitabine, Carboplatin.  To help prevent nausea and vomiting after your treatment, we encourage you to take your nausea medication as prescribed.   If you develop nausea and vomiting that is not controlled by your nausea medication, call the clinic.   BELOW ARE SYMPTOMS THAT SHOULD BE REPORTED IMMEDIATELY:  *FEVER GREATER THAN 100.5 F  *CHILLS WITH OR WITHOUT FEVER  NAUSEA AND VOMITING THAT IS NOT CONTROLLED WITH YOUR NAUSEA MEDICATION  *UNUSUAL SHORTNESS OF BREATH  *UNUSUAL BRUISING OR BLEEDING  TENDERNESS IN MOUTH AND THROAT WITH OR WITHOUT PRESENCE OF ULCERS  *URINARY PROBLEMS  *BOWEL PROBLEMS  UNUSUAL RASH Items with * indicate a potential emergency and should be followed up as soon as possible.  Feel free to call the clinic should you have any questions or concerns. The clinic phone number is [phone].  Please show the CHEMO ALERT CARD at check-in to the Emergency Department and triage nurse.   

## 2018-03-31 NOTE — Telephone Encounter (Signed)
Appointments scheduled AVS declined/calendar printed / contrast w/ instructions provided per 7/30 los

## 2018-04-07 ENCOUNTER — Other Ambulatory Visit: Payer: 59

## 2018-04-07 ENCOUNTER — Ambulatory Visit: Payer: 59

## 2018-04-20 ENCOUNTER — Inpatient Hospital Stay: Payer: 59 | Attending: Oncology

## 2018-04-20 ENCOUNTER — Inpatient Hospital Stay (HOSPITAL_BASED_OUTPATIENT_CLINIC_OR_DEPARTMENT_OTHER): Payer: 59 | Admitting: Oncology

## 2018-04-20 ENCOUNTER — Inpatient Hospital Stay: Payer: 59

## 2018-04-20 ENCOUNTER — Other Ambulatory Visit: Payer: Self-pay

## 2018-04-20 ENCOUNTER — Encounter: Payer: Self-pay | Admitting: Oncology

## 2018-04-20 VITALS — BP 130/58 | HR 64 | Temp 98.1°F | Resp 20 | Ht 72.0 in | Wt 202.6 lb

## 2018-04-20 DIAGNOSIS — Z5111 Encounter for antineoplastic chemotherapy: Secondary | ICD-10-CM | POA: Diagnosis not present

## 2018-04-20 DIAGNOSIS — R1310 Dysphagia, unspecified: Secondary | ICD-10-CM | POA: Diagnosis not present

## 2018-04-20 DIAGNOSIS — C775 Secondary and unspecified malignant neoplasm of intrapelvic lymph nodes: Secondary | ICD-10-CM

## 2018-04-20 DIAGNOSIS — Z95828 Presence of other vascular implants and grafts: Secondary | ICD-10-CM

## 2018-04-20 DIAGNOSIS — G893 Neoplasm related pain (acute) (chronic): Secondary | ICD-10-CM

## 2018-04-20 DIAGNOSIS — C679 Malignant neoplasm of bladder, unspecified: Secondary | ICD-10-CM | POA: Diagnosis not present

## 2018-04-20 DIAGNOSIS — R072 Precordial pain: Secondary | ICD-10-CM

## 2018-04-20 LAB — CBC WITH DIFFERENTIAL/PLATELET
Basophils Absolute: 0 10*3/uL (ref 0.0–0.1)
Basophils Relative: 0 %
Eosinophils Absolute: 0.1 10*3/uL (ref 0.0–0.5)
Eosinophils Relative: 2 %
HCT: 35.7 % — ABNORMAL LOW (ref 38.4–49.9)
Hemoglobin: 12.5 g/dL — ABNORMAL LOW (ref 13.0–17.1)
Lymphocytes Relative: 34 %
Lymphs Abs: 1.1 10*3/uL (ref 0.9–3.3)
MCH: 29.8 pg (ref 27.2–33.4)
MCHC: 35 g/dL (ref 32.0–36.0)
MCV: 85.2 fL (ref 79.3–98.0)
Monocytes Absolute: 0.5 10*3/uL (ref 0.1–0.9)
Monocytes Relative: 17 %
Neutro Abs: 1.5 10*3/uL (ref 1.5–6.5)
Neutrophils Relative %: 47 %
Platelets: 196 10*3/uL (ref 140–400)
RBC: 4.19 MIL/uL — ABNORMAL LOW (ref 4.20–5.82)
RDW: 15.4 % — ABNORMAL HIGH (ref 11.0–14.6)
WBC: 3.3 10*3/uL — ABNORMAL LOW (ref 4.0–10.3)

## 2018-04-20 LAB — CMP (CANCER CENTER ONLY)
ALT: 86 U/L — ABNORMAL HIGH (ref 0–44)
AST: 46 U/L — ABNORMAL HIGH (ref 15–41)
Albumin: 3.6 g/dL (ref 3.5–5.0)
Alkaline Phosphatase: 111 U/L (ref 38–126)
Anion gap: 8 (ref 5–15)
BUN: 19 mg/dL (ref 8–23)
CO2: 27 mmol/L (ref 22–32)
Calcium: 8.7 mg/dL — ABNORMAL LOW (ref 8.9–10.3)
Chloride: 103 mmol/L (ref 98–111)
Creatinine: 1.16 mg/dL (ref 0.61–1.24)
GFR, Est AFR Am: >60 mL/min (ref >60)
GFR, Estimated: >60 mL/min (ref >60)
Glucose, Bld: 220 mg/dL — ABNORMAL HIGH (ref 70–99)
Potassium: 4.2 mmol/L (ref 3.5–5.1)
Sodium: 138 mmol/L (ref 135–145)
Total Bilirubin: 0.5 mg/dL (ref 0.3–1.2)
Total Protein: 6.7 g/dL (ref 6.5–8.1)

## 2018-04-20 MED ORDER — SODIUM CHLORIDE 0.9% FLUSH
10.0000 mL | INTRAVENOUS | Status: DC | PRN
  Administered 2018-04-20: 10 mL
  Filled 2018-04-20: qty 10

## 2018-04-20 MED ORDER — SODIUM CHLORIDE 0.9 % IV SOLN
545.5000 mg | Freq: Once | INTRAVENOUS | Status: AC
  Administered 2018-04-20: 550 mg via INTRAVENOUS
  Filled 2018-04-20: qty 55

## 2018-04-20 MED ORDER — HEPARIN SOD (PORK) LOCK FLUSH 100 UNIT/ML IV SOLN
500.0000 [IU] | Freq: Once | INTRAVENOUS | Status: AC | PRN
  Administered 2018-04-20: 500 [IU]
  Filled 2018-04-20: qty 5

## 2018-04-20 MED ORDER — SODIUM CHLORIDE 0.9 % IV SOLN
Freq: Once | INTRAVENOUS | Status: AC
  Administered 2018-04-20: 11:00:00 via INTRAVENOUS
  Filled 2018-04-20: qty 250

## 2018-04-20 MED ORDER — SODIUM CHLORIDE 0.9% FLUSH
10.0000 mL | Freq: Once | INTRAVENOUS | Status: AC
  Administered 2018-04-20: 10 mL
  Filled 2018-04-20: qty 10

## 2018-04-20 MED ORDER — PALONOSETRON HCL INJECTION 0.25 MG/5ML
0.2500 mg | Freq: Once | INTRAVENOUS | Status: AC
  Administered 2018-04-20: 0.25 mg via INTRAVENOUS

## 2018-04-20 MED ORDER — SODIUM CHLORIDE 0.9 % IV SOLN
1000.0000 mg/m2 | Freq: Once | INTRAVENOUS | Status: AC
  Administered 2018-04-20: 2204 mg via INTRAVENOUS
  Filled 2018-04-20: qty 57.97

## 2018-04-20 MED ORDER — DEXAMETHASONE SODIUM PHOSPHATE 10 MG/ML IJ SOLN
10.0000 mg | Freq: Once | INTRAMUSCULAR | Status: AC
  Administered 2018-04-20: 10 mg via INTRAVENOUS

## 2018-04-20 MED ORDER — PALONOSETRON HCL INJECTION 0.25 MG/5ML
INTRAVENOUS | Status: AC
  Filled 2018-04-20: qty 5

## 2018-04-20 MED ORDER — DEXAMETHASONE SODIUM PHOSPHATE 10 MG/ML IJ SOLN
INTRAMUSCULAR | Status: AC
  Filled 2018-04-20: qty 1

## 2018-04-20 NOTE — Patient Instructions (Signed)
[facility] Discharge Instructions for Patients Receiving Chemotherapy  Today you received the following chemotherapy agents Gemzar and Carboplatin   To help prevent nausea and vomiting after your treatment, we encourage you to take your nausea medication as directed.    If you develop nausea and vomiting that is not controlled by your nausea medication, call the clinic.   BELOW ARE SYMPTOMS THAT SHOULD BE REPORTED IMMEDIATELY:  *FEVER GREATER THAN 100.5 F  *CHILLS WITH OR WITHOUT FEVER  NAUSEA AND VOMITING THAT IS NOT CONTROLLED WITH YOUR NAUSEA MEDICATION  *UNUSUAL SHORTNESS OF BREATH  *UNUSUAL BRUISING OR BLEEDING  TENDERNESS IN MOUTH AND THROAT WITH OR WITHOUT PRESENCE OF ULCERS  *URINARY PROBLEMS  *BOWEL PROBLEMS  UNUSUAL RASH Items with * indicate a potential emergency and should be followed up as soon as possible.  Feel free to call the clinic should you have any questions or concerns. The clinic phone number is [phone].  Please show the CHEMO ALERT CARD at check-in to the Emergency Department and triage nurse.   

## 2018-04-20 NOTE — Patient Instructions (Signed)
Implanted Port Home Guide An implanted port is a type of central line that is placed under the skin. Central lines are used to provide IV access when treatment or nutrition needs to be given through a person's veins. Implanted ports are used for long-term IV access. An implanted port may be placed because:  You need IV medicine that would be irritating to the small veins in your hands or arms.  You need long-term IV medicines, such as antibiotics.  You need IV nutrition for a long period.  You need frequent blood draws for lab tests.  You need dialysis.  Implanted ports are usually placed in the chest area, but they can also be placed in the upper arm, the abdomen, or the leg. An implanted port has two main parts:  Reservoir. The reservoir is round and will appear as a small, raised area under your skin. The reservoir is the part where a needle is inserted to give medicines or draw blood.  Catheter. The catheter is a thin, flexible tube that extends from the reservoir. The catheter is placed into a large vein. Medicine that is inserted into the reservoir goes into the catheter and then into the vein.  How will I care for my incision site? Do not get the incision site wet. Bathe or shower as directed by your health care provider. How is my port accessed? Special steps must be taken to access the port:  Before the port is accessed, a numbing cream can be placed on the skin. This helps numb the skin over the port site.  Your health care provider uses a sterile technique to access the port. ? Your health care provider must put on a mask and sterile gloves. ? The skin over your port is cleaned carefully with an antiseptic and allowed to dry. ? The port is gently pinched between sterile gloves, and a needle is inserted into the port.  Only "non-coring" port needles should be used to access the port. Once the port is accessed, a blood return should be checked. This helps ensure that the port  is in the vein and is not clogged.  If your port needs to remain accessed for a constant infusion, a clear (transparent) bandage will be placed over the needle site. The bandage and needle will need to be changed every week, or as directed by your health care provider.  Keep the bandage covering the needle clean and dry. Do not get it wet. Follow your health care provider's instructions on how to take a shower or bath while the port is accessed.  If your port does not need to stay accessed, no bandage is needed over the port.  What is flushing? Flushing helps keep the port from getting clogged. Follow your health care provider's instructions on how and when to flush the port. Ports are usually flushed with saline solution or a medicine called heparin. The need for flushing will depend on how the port is used.  If the port is used for intermittent medicines or blood draws, the port will need to be flushed: ? After medicines have been given. ? After blood has been drawn. ? As part of routine maintenance.  If a constant infusion is running, the port may not need to be flushed.  How long will my port stay implanted? The port can stay in for as long as your health care provider thinks it is needed. When it is time for the port to come out, surgery will be   done to remove it. The procedure is similar to the one performed when the port was put in. When should I seek immediate medical care? When you have an implanted port, you should seek immediate medical care if:  You notice a bad smell coming from the incision site.  You have swelling, redness, or drainage at the incision site.  You have more swelling or pain at the port site or the surrounding area.  You have a fever that is not controlled with medicine.  This information is not intended to replace advice given to you by your health care provider. Make sure you discuss any questions you have with your health care provider. Document  Released: 08/19/2005 Document Revised: 01/25/2016 Document Reviewed: 04/26/2013 Elsevier Interactive Patient Education  2017 [employer] Inc[employer]  

## 2018-04-20 NOTE — Progress Notes (Signed)
Hematology and Oncology Follow Up Visit  Danny Wood [id number] Dec 11, 1950 67 y.o. 04/20/2018 10:52 AM Danny Wood, Danny Brow, MD   Principle Diagnosis: 67 year old man with transitional cell carcinoma of the bladder diagnosed in September 2015 with stage T2N0.  He developed stage IV disease and May 2019 with pelvic adenopathy..   Prior Therapy:  He underwent a cystoscopy and a TURBT on 05/17/2014.   Neoadjuvant systemic chemotherapy in the form of gemcitabine and cisplatin cycle 1 day 1 is on 06/17/2014. He is S/P two cycles completed in 07/2014.  Therapy tolerated poorly with symptoms of nausea and vomiting and worsening renal function.  He is status post robotic cystoprostatectomy and bilateral lymphadenectomy completed on September 09, 2014.  The final pathology revealed T2N0 without any lymph node involvement.  He developed pelvic adenopathy that was biopsy-proven on Jan 14, 2018 to be metastatic urothelial carcinoma.  Current therapy: Carboplatin and gemcitabine cycle 1 started on 02/17/2018.  He completed 3 cycles of therapy and here for cycle 4.  Interim History: [name] is here for a follow-up visit.  Since the last visit, he completed the last cycle of chemotherapy without any major complications. He denies any excessive fatigue, tiredness or shortness of breath.  He continues to have chest wall pain and pain with deep inspiration but reports that it is better now that he takes Percocet.  He is still able to eat although he does report occasional dysphagia.  He is trying to eat smaller portions and control what he eats better.  His appetite and performance status remains excellent and have gained weight since the last visit.  He reported that he had what looked like "black flakes" in his urine for 1 to 2 days after his last cycle of chemotherapy.  This is completely resolved at this time.  He does not report any headaches, blurred vision, syncope or seizures.  He  denies any dizziness or change in his mood.  He denies any fevers or chills or sweats.  He denies any changes in his appetite and weight.  His report any chest pain, shortness of breath, palpitation, orthopnea.  He does not report any cough or wheezing or hemoptysis. He has not reported any nausea, vomiting.  He denies any early satiety or abdominal distention.  He denies any change in his bowel habits.  He does not report any bone pain or pathological fractures.  He has not observed any lymphadenopathy or petechiae.  He denies any skin irritations or lesions.  He denies any heat or cold intolerance.  Remainder of his review of systems is negative.   Medications: I have reviewed the patient's current medications.  Current Outpatient Medications  Medication Sig Dispense Refill  . lidocaine-prilocaine (EMLA) cream Apply 1 application topically as needed. 30 g 0  . oxyCODONE-acetaminophen (PERCOCET) 5-325 MG tablet Take 1-2 tablets by mouth every 6 (six) hours as needed for moderate pain or severe pain. 90 tablet 0  . ondansetron (ZOFRAN) 8 MG tablet Take 1 tablet (8 mg total) by mouth every 8 (eight) hours as needed for nausea or vomiting. (Patient not taking: Reported on 03/31/2018) 20 tablet 0  . prochlorperazine (COMPAZINE) 10 MG tablet Take 1 tablet (10 mg total) by mouth every 6 (six) hours as needed for nausea or vomiting. (Patient not taking: Reported on 03/10/2018) 30 tablet 0   No current facility-administered medications for this visit.      Allergies: No Known Allergies  Past Medical History, Surgical history, Social history,  and Family History were reviewed and updated.   Physical Exam:  Blood pressure (!) 130/58, pulse 64, temperature 98.1 F (36.7 C), temperature source Oral, resp. rate 20, height 6' (1.829 m), weight 202 lb 9.6 oz (91.9 kg), SpO2 98 %.    ECOG: 0  General appearance: Alert, awake gentleman without distress. Head: Normocephalic without normalities. Oropharynx:  Mucous membranes are moist and pink. Eyes: Sclera anicteric. Lymph nodes: No lymphadenopathy palpated in the cervical, supraclavicular, inguinal or axillary regions. Heart:regular rate and rhythm.  S1 and S2 without leg edema. Lung:clear without any rhonchi, wheezes or dullness to percussion.. Abdomen: Soft, without any rebound or guarding.  No shifting dullness or ascites. Skeletal skeletal: No joint deformity or effusion. Neurological: No deficits motor or sensory on exam today. Skin: No skin rashes or lesions. Psychiatric: Changes reported in his mood.  Lab Results: Lab Results  Component Value Date   WBC 3.3 (L) 04/20/2018   HGB 12.5 (L) 04/20/2018   HCT 35.7 (L) 04/20/2018   MCV 85.2 04/20/2018   PLT 196 04/20/2018     Chemistry      Component Value Date/Time   NA 138 04/20/2018 0906   NA 139 08/05/2014 1335   K 4.2 04/20/2018 0906   K 4.3 08/05/2014 1335   CL 103 04/20/2018 0906   CO2 27 04/20/2018 0906   CO2 26 08/05/2014 1335   BUN 19 04/20/2018 0906   BUN 18.2 08/05/2014 1335   CREATININE 1.16 04/20/2018 0906   CREATININE 1.1 08/05/2014 1335      Component Value Date/Time   CALCIUM 8.7 (L) 04/20/2018 0906   CALCIUM 9.5 08/05/2014 1335   ALKPHOS 111 04/20/2018 0906   ALKPHOS 98 08/05/2014 1335   AST 46 (H) 04/20/2018 0906   AST 45 (H) 08/05/2014 1335   ALT 86 (H) 04/20/2018 0906   ALT 73 (H) 08/05/2014 1335   BILITOT 0.5 04/20/2018 0906   BILITOT 0.47 08/05/2014 1335         Impression and Plan:  67 year old man with:    1.  Bladder cancer diagnosed in September 2015 with early-stage disease and subsequently developed stage IV metastasis in May 2019.  He completed 3 cycles of chemotherapy utilizing carboplatin and gemcitabine with cytopenias that resulted in eliminating of day 8 of cycle 2 of therapy.  CT scan of the chest obtained on 03/19/2018 did not show any evidence of obvious thoracic metastasis.  Risks and benefits of continuing chemotherapy  was reviewed today and is agreeable to continue.  We will eliminate day 8 of each cycle for better tolerance and given his recent cytopenias.  After completing 4 cycles of therapy we will reimage his abdomen and pelvis and consider on the duration of therapy at the time.  2. Renal insufficiency: Creatinine is normal today.  We will continue to monitor and adjust carboplatin dosing accordingly.  3. Antiemetics: No nausea or vomiting reported.  4.  IV access: Port-A-Cath remains in place without any issues.  5.  Pain: Is manageable with Percocet at this time.  His pain is predominantly in the pelvic area related to his cancer as well as chest wall pain.  6.  Dysphagia and pain in his sternum: CT scan of the chest obtained on 03/19/2018 showed no acute abnormalities in the chest.  We will consider endoscopy if the symptoms continues to be an issue.  7.  Prognosis: His performance status remains excellent although treatment is palliative in nature.  8. Followup: Will be in  3 weeks for the next cycle of chemotherapy and to review his restaging CT scan results.  Mikey Bussing, DNP, AGPCNP-BC, AOCNP 8/19/201910:52 AM

## 2018-04-23 ENCOUNTER — Other Ambulatory Visit: Payer: Self-pay | Admitting: Medical

## 2018-04-23 ENCOUNTER — Inpatient Hospital Stay: Payer: 59

## 2018-04-23 ENCOUNTER — Other Ambulatory Visit: Payer: Self-pay | Admitting: *Deleted

## 2018-04-23 ENCOUNTER — Telehealth: Payer: Self-pay | Admitting: *Deleted

## 2018-04-23 DIAGNOSIS — C679 Malignant neoplasm of bladder, unspecified: Secondary | ICD-10-CM

## 2018-04-23 DIAGNOSIS — Z5111 Encounter for antineoplastic chemotherapy: Secondary | ICD-10-CM | POA: Diagnosis not present

## 2018-04-23 LAB — URINALYSIS, COMPLETE (UACMP) WITH MICROSCOPIC
Bilirubin Urine: NEGATIVE
Glucose, UA: 50 mg/dL — AB
Ketones, ur: NEGATIVE mg/dL
Nitrite: POSITIVE — AB
Protein, ur: 100 mg/dL — AB
Specific Gravity, Urine: 1.018 (ref 1.005–1.030)
pH: 6 (ref 5.0–8.0)

## 2018-04-23 MED ORDER — SULFAMETHOXAZOLE-TRIMETHOPRIM 800-160 MG PO TABS: 1.0000 | ORAL_TABLET | Freq: Two times a day (BID) | ORAL | 0 refills | Status: DC

## 2018-04-23 NOTE — Telephone Encounter (Signed)
This nurse was called to lobby to see patient as a walk in. Patient stated,"after I have my chemo treatment, I get these huge, black flakes in my urine. I've emptied my urostomy and put it in this [employer] bag. I want it analyzed to see what it is." I went and talked to Alfredia Client, due to Dr. Alen Blew not being available, and he ordered a UA. Appointment made and lab order entered. Informed patient that he should give a fresh, urine sample from the urostomy and not the [employer] bag due to its two hours old and been sitting outside. He does not need to wait for lab results. I will call him with results. He verbalized understanding.

## 2018-05-12 ENCOUNTER — Ambulatory Visit (HOSPITAL_COMMUNITY)
Admission: RE | Admit: 2018-05-12 | Discharge: 2018-05-12 | Disposition: A | Discharge status: Home / Self Care | Payer: 59 | Source: Ambulatory Visit | Attending: Oncology | Admitting: Oncology

## 2018-05-12 DIAGNOSIS — R590 Localized enlarged lymph nodes: Secondary | ICD-10-CM | POA: Diagnosis not present

## 2018-05-12 DIAGNOSIS — Z905 Acquired absence of kidney: Secondary | ICD-10-CM | POA: Diagnosis not present

## 2018-05-12 DIAGNOSIS — C679 Malignant neoplasm of bladder, unspecified: Secondary | ICD-10-CM | POA: Diagnosis not present

## 2018-05-12 MED ORDER — IOHEXOL 300 MG/ML  SOLN
100.0000 mL | Freq: Once | INTRAMUSCULAR | Status: AC | PRN
  Administered 2018-05-12: 100 mL via INTRAVENOUS

## 2018-05-12 MED ORDER — SODIUM CHLORIDE 0.9 % IV SOLN
INTRAVENOUS | Status: AC
  Filled 2018-05-12: qty 250

## 2018-05-12 MED ORDER — HEPARIN SOD (PORK) LOCK FLUSH 100 UNIT/ML IV SOLN
INTRAVENOUS | Status: AC
  Filled 2018-05-12: qty 5

## 2018-05-12 MED ORDER — HEPARIN SOD (PORK) LOCK FLUSH 100 UNIT/ML IV SOLN
500.0000 [IU] | Freq: Once | INTRAVENOUS | Status: AC
  Administered 2018-05-12: 500 [IU] via INTRAVENOUS

## 2018-05-13 ENCOUNTER — Inpatient Hospital Stay: Payer: 59

## 2018-05-13 ENCOUNTER — Other Ambulatory Visit: Payer: Self-pay | Admitting: *Deleted

## 2018-05-13 ENCOUNTER — Inpatient Hospital Stay: Payer: 59 | Attending: Oncology

## 2018-05-13 ENCOUNTER — Inpatient Hospital Stay (HOSPITAL_BASED_OUTPATIENT_CLINIC_OR_DEPARTMENT_OTHER): Payer: 59 | Admitting: Oncology

## 2018-05-13 VITALS — BP 150/81 | HR 87 | Temp 98.2°F | Resp 16 | Ht 72.0 in

## 2018-05-13 DIAGNOSIS — Z9079 Acquired absence of other genital organ(s): Secondary | ICD-10-CM | POA: Diagnosis not present

## 2018-05-13 DIAGNOSIS — C775 Secondary and unspecified malignant neoplasm of intrapelvic lymph nodes: Secondary | ICD-10-CM

## 2018-05-13 DIAGNOSIS — Z5111 Encounter for antineoplastic chemotherapy: Secondary | ICD-10-CM | POA: Diagnosis not present

## 2018-05-13 DIAGNOSIS — R072 Precordial pain: Secondary | ICD-10-CM | POA: Insufficient documentation

## 2018-05-13 DIAGNOSIS — N289 Disorder of kidney and ureter, unspecified: Secondary | ICD-10-CM | POA: Insufficient documentation

## 2018-05-13 DIAGNOSIS — C679 Malignant neoplasm of bladder, unspecified: Secondary | ICD-10-CM

## 2018-05-13 DIAGNOSIS — Z905 Acquired absence of kidney: Secondary | ICD-10-CM | POA: Diagnosis not present

## 2018-05-13 DIAGNOSIS — R1310 Dysphagia, unspecified: Secondary | ICD-10-CM

## 2018-05-13 DIAGNOSIS — Z95828 Presence of other vascular implants and grafts: Secondary | ICD-10-CM

## 2018-05-13 DIAGNOSIS — Z9221 Personal history of antineoplastic chemotherapy: Secondary | ICD-10-CM

## 2018-05-13 DIAGNOSIS — C670 Malignant neoplasm of trigone of bladder: Secondary | ICD-10-CM

## 2018-05-13 LAB — CBC WITH DIFFERENTIAL/PLATELET
Basophils Absolute: 0 10*3/uL (ref 0.0–0.1)
Basophils Relative: 1 %
Eosinophils Absolute: 0 10*3/uL (ref 0.0–0.5)
Eosinophils Relative: 1 %
HCT: 33.6 % — ABNORMAL LOW (ref 38.4–49.9)
Hemoglobin: 12 g/dL — ABNORMAL LOW (ref 13.0–17.1)
Lymphocytes Relative: 37 %
Lymphs Abs: 1.3 10*3/uL (ref 0.9–3.3)
MCH: 31.1 pg (ref 27.2–33.4)
MCHC: 35.7 g/dL (ref 32.0–36.0)
MCV: 87 fL (ref 79.3–98.0)
Monocytes Absolute: 0.7 10*3/uL (ref 0.1–0.9)
Monocytes Relative: 20 %
Neutro Abs: 1.4 10*3/uL — ABNORMAL LOW (ref 1.5–6.5)
Neutrophils Relative %: 41 %
Platelets: 144 10*3/uL (ref 140–400)
RBC: 3.86 MIL/uL — ABNORMAL LOW (ref 4.20–5.82)
RDW: 16.8 % — ABNORMAL HIGH (ref 11.0–14.6)
WBC: 3.4 10*3/uL — ABNORMAL LOW (ref 4.0–10.3)

## 2018-05-13 LAB — CMP (CANCER CENTER ONLY)
ALT: 70 U/L — ABNORMAL HIGH (ref 0–44)
AST: 47 U/L — ABNORMAL HIGH (ref 15–41)
Albumin: 3.6 g/dL (ref 3.5–5.0)
Alkaline Phosphatase: 115 U/L (ref 38–126)
Anion gap: 9 (ref 5–15)
BUN: 19 mg/dL (ref 8–23)
CO2: 25 mmol/L (ref 22–32)
Calcium: 9 mg/dL (ref 8.9–10.3)
Chloride: 104 mmol/L (ref 98–111)
Creatinine: 1.23 mg/dL (ref 0.61–1.24)
GFR, Est AFR Am: >60 mL/min (ref >60)
GFR, Estimated: 59 mL/min — ABNORMAL LOW (ref >60)
Glucose, Bld: 166 mg/dL — ABNORMAL HIGH (ref 70–99)
Potassium: 4.2 mmol/L (ref 3.5–5.1)
Sodium: 138 mmol/L (ref 135–145)
Total Bilirubin: 0.7 mg/dL (ref 0.3–1.2)
Total Protein: 7.1 g/dL (ref 6.5–8.1)

## 2018-05-13 MED ORDER — SODIUM CHLORIDE 0.9 % IV SOLN
Freq: Once | INTRAVENOUS | Status: AC
  Administered 2018-05-13: 15:00:00 via INTRAVENOUS
  Filled 2018-05-13: qty 250

## 2018-05-13 MED ORDER — SODIUM CHLORIDE 0.9% FLUSH
10.0000 mL | INTRAVENOUS | Status: DC | PRN
  Administered 2018-05-13: 10 mL
  Filled 2018-05-13: qty 10

## 2018-05-13 MED ORDER — DEXAMETHASONE SODIUM PHOSPHATE 10 MG/ML IJ SOLN
10.0000 mg | Freq: Once | INTRAMUSCULAR | Status: AC
  Administered 2018-05-13: 10 mg via INTRAVENOUS

## 2018-05-13 MED ORDER — LIDOCAINE-PRILOCAINE 2.5-2.5 % EX CREA: 1.0000 "application " | TOPICAL_CREAM | CUTANEOUS | 0 refills | Status: DC | PRN

## 2018-05-13 MED ORDER — OXYCODONE-ACETAMINOPHEN 5-325 MG PO TABS: 1.0000 | ORAL_TABLET | Freq: Four times a day (QID) | ORAL | 0 refills | Status: DC | PRN

## 2018-05-13 MED ORDER — SODIUM CHLORIDE 0.9 % IV SOLN
550.0000 mg | Freq: Once | INTRAVENOUS | Status: AC
  Administered 2018-05-13: 550 mg via INTRAVENOUS
  Filled 2018-05-13: qty 55

## 2018-05-13 MED ORDER — PALONOSETRON HCL INJECTION 0.25 MG/5ML
0.2500 mg | Freq: Once | INTRAVENOUS | Status: AC
  Administered 2018-05-13: 0.25 mg via INTRAVENOUS

## 2018-05-13 MED ORDER — HEPARIN SOD (PORK) LOCK FLUSH 100 UNIT/ML IV SOLN
500.0000 [IU] | Freq: Once | INTRAVENOUS | Status: AC | PRN
  Administered 2018-05-13: 500 [IU]
  Filled 2018-05-13: qty 5

## 2018-05-13 MED ORDER — SODIUM CHLORIDE 0.9 % IV SOLN
1000.0000 mg/m2 | Freq: Once | INTRAVENOUS | Status: AC
  Administered 2018-05-13: 2204 mg via INTRAVENOUS
  Filled 2018-05-13: qty 57.97

## 2018-05-13 MED ORDER — DEXAMETHASONE SODIUM PHOSPHATE 10 MG/ML IJ SOLN
INTRAMUSCULAR | Status: AC
  Filled 2018-05-13: qty 1

## 2018-05-13 MED ORDER — PALONOSETRON HCL INJECTION 0.25 MG/5ML
INTRAVENOUS | Status: AC
  Filled 2018-05-13: qty 5

## 2018-05-13 MED ORDER — SODIUM CHLORIDE 0.9% FLUSH
10.0000 mL | Freq: Once | INTRAVENOUS | Status: AC
  Administered 2018-05-13: 10 mL
  Filled 2018-05-13: qty 10

## 2018-05-13 NOTE — Progress Notes (Signed)
Hematology and Oncology Follow Up Visit  Danny Wood [id number] 1951-08-15 67 y.o. 05/13/2018 1:02 PM [name], [name], MD   Principle Diagnosis: 67 year old man with stage IVtransitional cell carcinoma of the bladder diagnosed in May 2019 apathy..  He was initially diagnosed in September 2015 with stage T2N0.    Prior Therapy:  He underwent a cystoscopy and a TURBT on 05/17/2014.   Neoadjuvant systemic chemotherapy in the form of gemcitabine and cisplatin cycle 1 day 1 is on 06/17/2014. He is S/P two cycles completed in 07/2014.  Therapy tolerated poorly with symptoms of nausea and vomiting and worsening renal function.  He is status post robotic cystoprostatectomy and bilateral lymphadenectomy completed on September 09, 2014.  The final pathology revealed T2N0 without any lymph node involvement.  He developed pelvic adenopathy that was biopsy-proven on Jan 14, 2018 to be metastatic urothelial carcinoma.  Current therapy: Carboplatin and gemcitabine cycle 1 started on 02/17/2018.  He completed 4 cycles of therapy.  Interim History: [name] presents today for a follow-up.  Since her last visit, he reports no major complications related to chemotherapy.  He denies any nausea, fatigue or infusion related complications.  He continues to have chest wall discomfort and occasional dysphasia.  His dysphagia at times solid and liquid.  He does have the sensation of food and liquid being stuck.  No fevers or chills or any recent infections.  He denies any hospitalizations.  He does not report any headaches, blurred vision, syncope or seizures.  He denies any alteration in mental status or confusion.   He denies any fevers or chills or sweats.  He denies any chest pain, shortness of breath, palpitation, orthopnea.  He does not report any cough or wheezing or hemoptysis. He has not reported any nausea, vomiting.  He denies any constipation or diarrhea.  He denies any  arthralgias or myalgias.  He has not observed any lymphadenopathy or petechiae.  He denies any skin skin rashes or lesions.  He denies any heat or cold intolerance.  He denies any mood changes or depression.  Remainder of his review of systems is negative.   Medications: I have reviewed the patient's current medications.  Current Outpatient Medications  Medication Sig Dispense Refill  . lidocaine-prilocaine (EMLA) cream Apply 1 application topically as needed. 30 g 0  . ondansetron (ZOFRAN) 8 MG tablet Take 1 tablet (8 mg total) by mouth every 8 (eight) hours as needed for nausea or vomiting. (Patient not taking: Reported on 03/31/2018) 20 tablet 0  . oxyCODONE-acetaminophen (PERCOCET) 5-325 MG tablet Take 1-2 tablets by mouth every 6 (six) hours as needed for moderate pain or severe pain. 90 tablet 0  . prochlorperazine (COMPAZINE) 10 MG tablet Take 1 tablet (10 mg total) by mouth every 6 (six) hours as needed for nausea or vomiting. (Patient not taking: Reported on 03/10/2018) 30 tablet 0  . sulfamethoxazole-trimethoprim (BACTRIM DS,SEPTRA DS) 800-160 MG tablet Take 1 tablet by mouth 2 (two) times daily. 14 tablet 0   No current facility-administered medications for this visit.      Allergies: No Known Allergies  Past Medical History, Surgical history, Social history, and Family History were reviewed and updated.   Physical Exam:   ECOG: 0   General appearance: Comfortable appearing without any discomfort Head: Normocephalic without any trauma Oropharynx: Mucous membranes are moist and pink without any thrush or ulcers. Eyes: Pupils are equal and round reactive to light. Lymph nodes: No cervical, supraclavicular, inguinal or axillary lymphadenopathy.  Heart:regular rate and rhythm.  S1 and S2 without leg edema. Lung: Clear without any rhonchi or wheezes.  No dullness to percussion. Abdomin: Soft, nontender, nondistended with good bowel sounds.  No  hepatosplenomegaly. Musculoskeletal: No joint deformity or effusion.  Full range of motion noted. Neurological: No deficits noted on motor, sensory and deep tendon reflex exam. Skin: No petechial rash or dryness.  Appeared moist.    Lab Results: Lab Results  Component Value Date   WBC 3.3 (L) 04/20/2018   HGB 12.5 (L) 04/20/2018   HCT 35.7 (L) 04/20/2018   MCV 85.2 04/20/2018   PLT 196 04/20/2018     Chemistry      Component Value Date/Time   NA 138 04/20/2018 0906   NA 139 08/05/2014 1335   K 4.2 04/20/2018 0906   K 4.3 08/05/2014 1335   CL 103 04/20/2018 0906   CO2 27 04/20/2018 0906   CO2 26 08/05/2014 1335   BUN 19 04/20/2018 0906   BUN 18.2 08/05/2014 1335   CREATININE 1.16 04/20/2018 0906   CREATININE 1.1 08/05/2014 1335      Component Value Date/Time   CALCIUM 8.7 (L) 04/20/2018 0906   CALCIUM 9.5 08/05/2014 1335   ALKPHOS 111 04/20/2018 0906   ALKPHOS 98 08/05/2014 1335   AST 46 (H) 04/20/2018 0906   AST 45 (H) 08/05/2014 1335   ALT 86 (H) 04/20/2018 0906   ALT 73 (H) 08/05/2014 1335   BILITOT 0.5 04/20/2018 0906   BILITOT 0.47 08/05/2014 1335      EXAM: CT ABDOMEN AND PELVIS WITH CONTRAST  TECHNIQUE: Multidetector CT imaging of the abdomen and pelvis was performed using the standard protocol following bolus administration of intravenous contrast.  CONTRAST:  168mL OMNIPAQUE IOHEXOL 300 MG/ML  SOLN  COMPARISON:  01/01/2018  FINDINGS: Lower chest: Lung bases are clear.  Hepatobiliary: Liver is within normal limits.  Gallbladder is unremarkable. No intrahepatic or extrahepatic duct dilatation.  Pancreas: Within normal limits.  Spleen: Within normal limits.  Adrenals/Urinary Tract: Adrenal glands within normal limits.  3.8 cm cyst in the medial interpolar left kidney (series 2/image 35). No enhancing renal lesions. Mild right renal cortical scarring related to partial right nephrectomy.  On delayed imaging, there are no  filling defects in the right renal collecting systems or ureters.  Status post cystectomy with right mid abdominal ileal conduit.  Stomach/Bowel: Stomach is within normal limits.  No evidence of bowel obstruction.  Normal appendix (series 2/image 53).  Vascular/Lymphatic: No evidence of abdominal aortic aneurysm.  Atherosclerotic calcifications of the abdominal aorta and branch vessels.  Improving retroperitoneal/pelvic lymphadenopathy, including:  --13 mm short axis left para-aortic node (series 2/image 42), previously 18 mm  --12 mm short axis left external iliac node (series 2/image 75), previously 22 mm  --13 mm short axis right inguinal node (series 2/image 83), previously 20 mm  Reproductive: Status post prostatectomy.  Other: No abdominopelvic ascites.  Fat within the bilateral inguinal canals.  Musculoskeletal: Mild degenerative changes of the visualized thoracolumbar spine.  IMPRESSION: Status post cystoprostatectomy with right mid abdominal ileal conduit.  Status post partial right nephrectomy.  Improving retroperitoneal/pelvic lymphadenopathy.    Impression and Plan:  67 year old man with:    1.  Stage IV urothelial carcinoma of the bladder documented in May 2019.  His initial cancer diagnosed in September 2015.    He is currently receiving carboplatin and gemcitabine chemotherapy with excellent tolerance and response.  CT scan obtained on 05/12/2018 was personally reviewed and showed  regression of his lymphadenopathy.  Risks and benefits of continuing this therapy was reviewed today and long-term complications were discussed.  After discussion today, the plan is to proceed with at least 6 cycles of therapy and potentially total of 8 depending on his tolerance.  2. Renal insufficiency: His creatinine stabilized at this time with normal baseline function.  3. Antiemetics: Mattix is available to him at this time.  4.  IV access:  Port-A-Cath is in use without any complications.  5.  Pain: Manageable with Percocet and topical creams.  His pain is predominantly in the chest wall.  6.  Dysphagia and pain in his sternum: I will refer him to gastroenterology for evaluation regarding this finding.  He might need an endoscopy to evaluate he is solid and liquid dysphasia.  7.  Prognosis: Treatment is palliative at this time but his disease is very limited and he had an excellent response at this time.  Aggressive therapy is warranted.  8. Followup: Will be in 3 weeks for the next cycle of chemotherapy.  25  minutes was spent with the patient face-to-face today.  More than 50% of time was dedicated to reviewing the natural course of this disease, reviewing imaging studies and coordinating plan of care.   [name], MD 9/11/20191:02 PM

## 2018-05-13 NOTE — Patient Instructions (Signed)
[facility] Discharge Instructions for Patients Receiving Chemotherapy  Today you received the following chemotherapy agents Gemzar and Carboplatin   To help prevent nausea and vomiting after your treatment, we encourage you to take your nausea medication as directed.    If you develop nausea and vomiting that is not controlled by your nausea medication, call the clinic.   BELOW ARE SYMPTOMS THAT SHOULD BE REPORTED IMMEDIATELY:  *FEVER GREATER THAN 100.5 F  *CHILLS WITH OR WITHOUT FEVER  NAUSEA AND VOMITING THAT IS NOT CONTROLLED WITH YOUR NAUSEA MEDICATION  *UNUSUAL SHORTNESS OF BREATH  *UNUSUAL BRUISING OR BLEEDING  TENDERNESS IN MOUTH AND THROAT WITH OR WITHOUT PRESENCE OF ULCERS  *URINARY PROBLEMS  *BOWEL PROBLEMS  UNUSUAL RASH Items with * indicate a potential emergency and should be followed up as soon as possible.  Feel free to call the clinic should you have any questions or concerns. The clinic phone number is [phone].  Please show the CHEMO ALERT CARD at check-in to the Emergency Department and triage nurse.   

## 2018-05-13 NOTE — Progress Notes (Signed)
Ok to treat with abnormal labs from today per MD [facility]

## 2018-06-03 ENCOUNTER — Inpatient Hospital Stay: Payer: 59

## 2018-06-03 ENCOUNTER — Inpatient Hospital Stay (HOSPITAL_BASED_OUTPATIENT_CLINIC_OR_DEPARTMENT_OTHER): Payer: 59 | Admitting: Oncology

## 2018-06-03 ENCOUNTER — Inpatient Hospital Stay: Payer: 59 | Attending: Oncology

## 2018-06-03 ENCOUNTER — Telehealth: Payer: Self-pay

## 2018-06-03 VITALS — BP 141/67 | HR 85 | Temp 98.3°F | Resp 18 | Ht 72.0 in | Wt 200.5 lb

## 2018-06-03 DIAGNOSIS — N289 Disorder of kidney and ureter, unspecified: Secondary | ICD-10-CM | POA: Diagnosis not present

## 2018-06-03 DIAGNOSIS — R072 Precordial pain: Secondary | ICD-10-CM | POA: Insufficient documentation

## 2018-06-03 DIAGNOSIS — Z5111 Encounter for antineoplastic chemotherapy: Secondary | ICD-10-CM | POA: Insufficient documentation

## 2018-06-03 DIAGNOSIS — C679 Malignant neoplasm of bladder, unspecified: Secondary | ICD-10-CM

## 2018-06-03 DIAGNOSIS — Z5189 Encounter for other specified aftercare: Secondary | ICD-10-CM | POA: Diagnosis not present

## 2018-06-03 DIAGNOSIS — C775 Secondary and unspecified malignant neoplasm of intrapelvic lymph nodes: Secondary | ICD-10-CM | POA: Insufficient documentation

## 2018-06-03 DIAGNOSIS — Z95828 Presence of other vascular implants and grafts: Secondary | ICD-10-CM

## 2018-06-03 DIAGNOSIS — R1310 Dysphagia, unspecified: Secondary | ICD-10-CM | POA: Diagnosis not present

## 2018-06-03 DIAGNOSIS — Z79899 Other long term (current) drug therapy: Secondary | ICD-10-CM

## 2018-06-03 LAB — CMP (CANCER CENTER ONLY)
ALT: 78 U/L — ABNORMAL HIGH (ref 0–44)
AST: 52 U/L — ABNORMAL HIGH (ref 15–41)
Albumin: 3.3 g/dL — ABNORMAL LOW (ref 3.5–5.0)
Alkaline Phosphatase: 111 U/L (ref 38–126)
Anion gap: 9 (ref 5–15)
BUN: 16 mg/dL (ref 8–23)
CO2: 27 mmol/L (ref 22–32)
Calcium: 8.6 mg/dL — ABNORMAL LOW (ref 8.9–10.3)
Chloride: 102 mmol/L (ref 98–111)
Creatinine: 1.03 mg/dL (ref 0.61–1.24)
GFR, Est AFR Am: >60 mL/min (ref >60)
GFR, Estimated: >60 mL/min (ref >60)
Glucose, Bld: 226 mg/dL — ABNORMAL HIGH (ref 70–99)
Potassium: 4.2 mmol/L (ref 3.5–5.1)
Sodium: 138 mmol/L (ref 135–145)
Total Bilirubin: 0.6 mg/dL (ref 0.3–1.2)
Total Protein: 6.6 g/dL (ref 6.5–8.1)

## 2018-06-03 LAB — CBC WITH DIFFERENTIAL/PLATELET
Basophils Absolute: 0 10*3/uL (ref 0.0–0.1)
Basophils Relative: 0 %
Eosinophils Absolute: 0 10*3/uL (ref 0.0–0.5)
Eosinophils Relative: 1 %
HCT: 30.8 % — ABNORMAL LOW (ref 38.4–49.9)
Hemoglobin: 10.9 g/dL — ABNORMAL LOW (ref 13.0–17.1)
Lymphocytes Relative: 22 %
Lymphs Abs: 0.8 10*3/uL — ABNORMAL LOW (ref 0.9–3.3)
MCH: 33.2 pg (ref 27.2–33.4)
MCHC: 35.5 g/dL (ref 32.0–36.0)
MCV: 93.5 fL (ref 79.3–98.0)
Monocytes Absolute: 0.8 10*3/uL (ref 0.1–0.9)
Monocytes Relative: 22 %
Neutro Abs: 1.9 10*3/uL (ref 1.5–6.5)
Neutrophils Relative %: 55 %
Platelets: 132 10*3/uL — ABNORMAL LOW (ref 140–400)
RBC: 3.29 MIL/uL — ABNORMAL LOW (ref 4.20–5.82)
RDW: 18.5 % — ABNORMAL HIGH (ref 11.0–14.6)
WBC: 3.5 10*3/uL — ABNORMAL LOW (ref 4.0–10.3)

## 2018-06-03 MED ORDER — SODIUM CHLORIDE 0.9 % IV SOLN
550.0000 mg | Freq: Once | INTRAVENOUS | Status: AC
  Administered 2018-06-03: 550 mg via INTRAVENOUS
  Filled 2018-06-03: qty 55

## 2018-06-03 MED ORDER — PALONOSETRON HCL INJECTION 0.25 MG/5ML
INTRAVENOUS | Status: AC
  Filled 2018-06-03: qty 5

## 2018-06-03 MED ORDER — PALONOSETRON HCL INJECTION 0.25 MG/5ML
0.2500 mg | Freq: Once | INTRAVENOUS | Status: AC
  Administered 2018-06-03: 0.25 mg via INTRAVENOUS

## 2018-06-03 MED ORDER — DEXAMETHASONE SODIUM PHOSPHATE 10 MG/ML IJ SOLN
10.0000 mg | Freq: Once | INTRAMUSCULAR | Status: AC
  Administered 2018-06-03: 10 mg via INTRAVENOUS

## 2018-06-03 MED ORDER — DEXAMETHASONE SODIUM PHOSPHATE 10 MG/ML IJ SOLN
INTRAMUSCULAR | Status: AC
  Filled 2018-06-03: qty 1

## 2018-06-03 MED ORDER — SODIUM CHLORIDE 0.9% FLUSH
10.0000 mL | Freq: Once | INTRAVENOUS | Status: AC
  Administered 2018-06-03: 10 mL
  Filled 2018-06-03: qty 10

## 2018-06-03 MED ORDER — SODIUM CHLORIDE 0.9 % IV SOLN
Freq: Once | INTRAVENOUS | Status: AC
  Administered 2018-06-03: 10:00:00 via INTRAVENOUS
  Filled 2018-06-03: qty 250

## 2018-06-03 MED ORDER — SODIUM CHLORIDE 0.9 % IV SOLN
1000.0000 mg/m2 | Freq: Once | INTRAVENOUS | Status: AC
  Administered 2018-06-03: 2204 mg via INTRAVENOUS
  Filled 2018-06-03: qty 58

## 2018-06-03 MED ORDER — SODIUM CHLORIDE 0.9% FLUSH
10.0000 mL | INTRAVENOUS | Status: DC | PRN
  Administered 2018-06-03: 10 mL
  Filled 2018-06-03: qty 10

## 2018-06-03 MED ORDER — HEPARIN SOD (PORK) LOCK FLUSH 100 UNIT/ML IV SOLN
500.0000 [IU] | Freq: Once | INTRAVENOUS | Status: AC | PRN
  Administered 2018-06-03: 500 [IU]
  Filled 2018-06-03: qty 5

## 2018-06-03 NOTE — Patient Instructions (Signed)
[facility] Discharge Instructions for Patients Receiving Chemotherapy  Today you received the following chemotherapy agents gemcitabine (Gemzar) and carboplatin (Paraplatin).  To help prevent nausea and vomiting after your treatment, we encourage you to take your nausea medication as directed.   If you develop nausea and vomiting that is not controlled by your nausea medication, call the clinic.   BELOW ARE SYMPTOMS THAT SHOULD BE REPORTED IMMEDIATELY:  *FEVER GREATER THAN 100.5 F  *CHILLS WITH OR WITHOUT FEVER  NAUSEA AND VOMITING THAT IS NOT CONTROLLED WITH YOUR NAUSEA MEDICATION  *UNUSUAL SHORTNESS OF BREATH  *UNUSUAL BRUISING OR BLEEDING  TENDERNESS IN MOUTH AND THROAT WITH OR WITHOUT PRESENCE OF ULCERS  *URINARY PROBLEMS  *BOWEL PROBLEMS  UNUSUAL RASH Items with * indicate a potential emergency and should be followed up as soon as possible.  Feel free to call the clinic should you have any questions or concerns. The clinic phone number is [phone].  Please show the [place] at check-in to the Emergency Department and triage nurse.

## 2018-06-03 NOTE — Telephone Encounter (Signed)
Per patient request to change date, because he had made pre plans to be out of town that week. Shadad aproved for his return visit to be delayed by 1 week. Per 10/2 verbal respond to patient request based on the los. Printed avs and calender of the upcoming appointment.

## 2018-06-03 NOTE — Progress Notes (Signed)
Hematology and Oncology Follow Up Visit  Danny Wood [id number] 19-Feb-1951 67 y.o. 06/03/2018 9:03 AM Danny Wood, Danny Brow, MD   Principle Diagnosis: 67 year old man with T2N0 transitional cell carcinoma of the bladder diagnosed in May 2015. He developed stage IV disease and May 2019 with pelvic adenopathy.  Prior Therapy:  He underwent a cystoscopy and a TURBT on 05/17/2014.   Neoadjuvant systemic chemotherapy in the form of gemcitabine and cisplatin cycle 1 day 1 is on 06/17/2014. He is S/P two cycles completed in 07/2014.  Therapy tolerated poorly with symptoms of nausea and vomiting and worsening renal function.  He is status post robotic cystoprostatectomy and bilateral lymphadenectomy completed on September 09, 2014.  The final pathology revealed T2N0 without any lymph node involvement.  He developed pelvic adenopathy that was biopsy-proven on Jan 14, 2018 to be metastatic urothelial carcinoma.  Current therapy: Carboplatin and gemcitabine cycle 1 started on 02/17/2018.  He is here for cycle 6 of therapy.  Interim History: Danny Wood is here for a follow-up.  Since last visit, he completed the last cycle of chemotherapy without any complications.  He denies any nausea, fatigue or weight loss.  He denies any infusion related complications.  His performance status and activity level remain unchanged.  His quality of life remain excellent.  He continues to have some occasional dysphasia and chest wall discomfort which has been stable overall.  He does not report any headaches, blurred vision, syncope or seizures.  He denies any dizziness or lethargy.   He denies any fevers or chills or sweats.  He denies any chest pain, shortness of breath, palpitation, orthopnea.  He does not report any cough or wheezing or hemoptysis. He has not reported any nausea, vomiting.  He denies any changes in bowel habits.  He denies any joint pain or deformity..  He has not observed any  lymphadenopathy or petechiae.  He denies any skin skin rashes or lesions.  He denies any anxiety or depression.  Denies any bleeding or clotting tendency.  Remainder of his review of systems is negative.   Medications: I have reviewed the patient's current medications.  Current Outpatient Medications  Medication Sig Dispense Refill  . lidocaine-prilocaine (EMLA) cream Apply 1 application topically as needed. 30 g 0  . ondansetron (ZOFRAN) 8 MG tablet Take 1 tablet (8 mg total) by mouth every 8 (eight) hours as needed for nausea or vomiting. 20 tablet 0  . oxyCODONE-acetaminophen (PERCOCET) 5-325 MG tablet Take 1-2 tablets by mouth every 6 (six) hours as needed for moderate pain or severe pain. 90 tablet 0  . prochlorperazine (COMPAZINE) 10 MG tablet Take 1 tablet (10 mg total) by mouth every 6 (six) hours as needed for nausea or vomiting. 30 tablet 0  . sulfamethoxazole-trimethoprim (BACTRIM DS,SEPTRA DS) 800-160 MG tablet Take 1 tablet by mouth 2 (two) times daily. 14 tablet 0   No current facility-administered medications for this visit.      Allergies: No Known Allergies  Past Medical History, Surgical history, Social history, and Family History were reviewed and updated.   Physical Exam:  Blood pressure (!) 141/67, pulse 85, temperature 98.3 F (36.8 C), temperature source Oral, resp. rate 18, height 6' (1.829 m), weight 200 lb 8 oz (90.9 kg), SpO2 100 %.   ECOG: 0   General appearance: Alert, awake without any distress. Head: Atraumatic without abnormalities Oropharynx: Without any thrush or ulcers. Eyes: No scleral icterus. Lymph nodes: No lymphadenopathy noted in the cervical, supraclavicular, or  axillary nodes Heart:regular rate and rhythm, without any murmurs or gallops.   Lung: Clear to auscultation without any rhonchi, wheezes or dullness to percussion. Chest wall palpation: No masses or protrusions noted. Abdomin: Soft, nontender without any shifting dullness or  ascites. Musculoskeletal: No clubbing or cyanosis. Neurological: No motor or sensory deficits. Skin: No rashes or lesions. Psychiatric: Mood and affect appeared normal.     Lab Results: Lab Results  Component Value Date   WBC 3.4 (L) 05/13/2018   HGB 12.0 (L) 05/13/2018   HCT 33.6 (L) 05/13/2018   MCV 87.0 05/13/2018   PLT 144 05/13/2018     Chemistry      Component Value Date/Time   NA 138 05/13/2018 1237   NA 139 08/05/2014 1335   K 4.2 05/13/2018 1237   K 4.3 08/05/2014 1335   CL 104 05/13/2018 1237   CO2 25 05/13/2018 1237   CO2 26 08/05/2014 1335   BUN 19 05/13/2018 1237   BUN 18.2 08/05/2014 1335   CREATININE 1.23 05/13/2018 1237   CREATININE 1.1 08/05/2014 1335      Component Value Date/Time   CALCIUM 9.0 05/13/2018 1237   CALCIUM 9.5 08/05/2014 1335   ALKPHOS 115 05/13/2018 1237   ALKPHOS 98 08/05/2014 1335   AST 47 (H) 05/13/2018 1237   AST 45 (H) 08/05/2014 1335   ALT 70 (H) 05/13/2018 1237   ALT 73 (H) 08/05/2014 1335   BILITOT 0.7 05/13/2018 1237   BILITOT 0.47 08/05/2014 1335       Impression and Plan:  67 year old man with:    1.  High-grade urothelial carcinoma of the bladder metastatic disease involving pelvic adenopathy.   He continues to tolerate chemotherapy very well utilizing carboplatin and gemcitabine.  Risks and benefits of continuing therapy was reviewed today.  Long-term complication associated with this therapy was reviewed.  The plan is to tentatively complete 8 cycles of therapy and repeat imaging studies at that time.  He has no objections with this plan at this time.  2. Renal insufficiency: His kidney function remains at baseline.  3. Antiemetics: No issues reported with nausea and vomiting at this time.  Compazine is available to him.  4.  IV access: Port-A-Cath continues to be in use and without any complication.  5.  Pain: His pain is predominantly in the chest wall with unclear etiology.  He does take Percocet  periodically.  6.  Dysphagia and pain in his sternum: Work-up by GI remains pending at this time.  7.  Prognosis: Treatment goal is palliative although his performance status is excellent and aggressive therapy is warranted.  8. Followup: Will be in 3 weeks for the next cycle of chemotherapy.  25  minutes was spent with the patient face-to-face today.  More than 50% of time was dedicated to discussing his disease status, treatment options and dealing with complications related to therapy.   [name], MD 10/2/20199:03 AM

## 2018-06-24 ENCOUNTER — Inpatient Hospital Stay: Payer: 59

## 2018-06-24 ENCOUNTER — Inpatient Hospital Stay (HOSPITAL_BASED_OUTPATIENT_CLINIC_OR_DEPARTMENT_OTHER): Payer: 59 | Admitting: Oncology

## 2018-06-24 ENCOUNTER — Telehealth: Payer: Self-pay

## 2018-06-24 VITALS — BP 139/72 | HR 62 | Temp 97.8°F | Resp 17 | Ht 72.0 in | Wt 204.0 lb

## 2018-06-24 DIAGNOSIS — R072 Precordial pain: Secondary | ICD-10-CM

## 2018-06-24 DIAGNOSIS — R1310 Dysphagia, unspecified: Secondary | ICD-10-CM | POA: Diagnosis not present

## 2018-06-24 DIAGNOSIS — C679 Malignant neoplasm of bladder, unspecified: Secondary | ICD-10-CM

## 2018-06-24 DIAGNOSIS — Z79899 Other long term (current) drug therapy: Secondary | ICD-10-CM

## 2018-06-24 DIAGNOSIS — N289 Disorder of kidney and ureter, unspecified: Secondary | ICD-10-CM

## 2018-06-24 DIAGNOSIS — C775 Secondary and unspecified malignant neoplasm of intrapelvic lymph nodes: Secondary | ICD-10-CM | POA: Diagnosis not present

## 2018-06-24 DIAGNOSIS — Z5189 Encounter for other specified aftercare: Secondary | ICD-10-CM | POA: Diagnosis not present

## 2018-06-24 LAB — CBC WITH DIFFERENTIAL/PLATELET
Abs Immature Granulocytes: 0 10*3/uL (ref 0.00–0.07)
Basophils Absolute: 0 10*3/uL (ref 0.0–0.1)
Basophils Relative: 0 %
Eosinophils Absolute: 0 10*3/uL (ref 0.0–0.5)
Eosinophils Relative: 1 %
HCT: 29 % — ABNORMAL LOW (ref 39.0–52.0)
Hemoglobin: 10.4 g/dL — ABNORMAL LOW (ref 13.0–17.0)
Immature Granulocytes: 0 %
Lymphocytes Relative: 35 %
Lymphs Abs: 0.8 10*3/uL (ref 0.7–4.0)
MCH: 33.9 pg (ref 26.0–34.0)
MCHC: 35.9 g/dL (ref 30.0–36.0)
MCV: 94.5 fL (ref 80.0–100.0)
Monocytes Absolute: 0.6 10*3/uL (ref 0.1–1.0)
Monocytes Relative: 24 %
Neutro Abs: 0.9 10*3/uL — ABNORMAL LOW (ref 1.7–7.7)
Neutrophils Relative %: 40 %
Platelets: 132 10*3/uL — ABNORMAL LOW (ref 150–400)
RBC: 3.07 MIL/uL — ABNORMAL LOW (ref 4.22–5.81)
RDW: 15.7 % — ABNORMAL HIGH (ref 11.5–15.5)
WBC: 2.3 10*3/uL — ABNORMAL LOW (ref 4.0–10.5)
nRBC: 0 % (ref 0.0–0.2)

## 2018-06-24 LAB — CMP (CANCER CENTER ONLY)
ALT: 106 U/L — ABNORMAL HIGH (ref 0–44)
AST: 58 U/L — ABNORMAL HIGH (ref 15–41)
Albumin: 3.3 g/dL — ABNORMAL LOW (ref 3.5–5.0)
Alkaline Phosphatase: 103 U/L (ref 38–126)
Anion gap: 9 (ref 5–15)
BUN: 12 mg/dL (ref 8–23)
CO2: 26 mmol/L (ref 22–32)
Calcium: 8.6 mg/dL — ABNORMAL LOW (ref 8.9–10.3)
Chloride: 104 mmol/L (ref 98–111)
Creatinine: 0.98 mg/dL (ref 0.61–1.24)
GFR, Est AFR Am: >60 mL/min (ref >60)
GFR, Estimated: >60 mL/min (ref >60)
Glucose, Bld: 159 mg/dL — ABNORMAL HIGH (ref 70–99)
Potassium: 4.2 mmol/L (ref 3.5–5.1)
Sodium: 139 mmol/L (ref 135–145)
Total Bilirubin: 0.6 mg/dL (ref 0.3–1.2)
Total Protein: 6.7 g/dL (ref 6.5–8.1)

## 2018-06-24 MED ORDER — DIPHENHYDRAMINE HCL 25 MG PO CAPS
ORAL_CAPSULE | ORAL | Status: AC
  Filled 2018-06-24: qty 1

## 2018-06-24 MED ORDER — SODIUM CHLORIDE 0.9% FLUSH
10.0000 mL | INTRAVENOUS | Status: DC | PRN
  Administered 2018-06-24: 10 mL
  Filled 2018-06-24: qty 10

## 2018-06-24 MED ORDER — DEXAMETHASONE SODIUM PHOSPHATE 10 MG/ML IJ SOLN
10.0000 mg | Freq: Once | INTRAMUSCULAR | Status: AC
  Administered 2018-06-24: 10 mg via INTRAVENOUS

## 2018-06-24 MED ORDER — DIPHENHYDRAMINE HCL 25 MG PO TABS
25.0000 mg | ORAL_TABLET | Freq: Once | ORAL | Status: AC
  Administered 2018-06-24: 25 mg via ORAL
  Filled 2018-06-24: qty 1

## 2018-06-24 MED ORDER — FAMOTIDINE IN NACL 20-0.9 MG/50ML-% IV SOLN
20.0000 mg | Freq: Once | INTRAVENOUS | Status: AC
  Administered 2018-06-24: 20 mg via INTRAVENOUS

## 2018-06-24 MED ORDER — DEXAMETHASONE SODIUM PHOSPHATE 10 MG/ML IJ SOLN
INTRAMUSCULAR | Status: AC
  Filled 2018-06-24: qty 1

## 2018-06-24 MED ORDER — FAMOTIDINE IN NACL 20-0.9 MG/50ML-% IV SOLN
INTRAVENOUS | Status: AC
  Filled 2018-06-24: qty 50

## 2018-06-24 MED ORDER — HEPARIN SOD (PORK) LOCK FLUSH 100 UNIT/ML IV SOLN
500.0000 [IU] | Freq: Once | INTRAVENOUS | Status: AC | PRN
  Administered 2018-06-24: 500 [IU]
  Filled 2018-06-24: qty 5

## 2018-06-24 MED ORDER — PALONOSETRON HCL INJECTION 0.25 MG/5ML
0.2500 mg | Freq: Once | INTRAVENOUS | Status: AC
  Administered 2018-06-24: 0.25 mg via INTRAVENOUS

## 2018-06-24 MED ORDER — PALONOSETRON HCL INJECTION 0.25 MG/5ML
INTRAVENOUS | Status: AC
  Filled 2018-06-24: qty 5

## 2018-06-24 MED ORDER — SODIUM CHLORIDE 0.9 % IV SOLN
550.0000 mg | Freq: Once | INTRAVENOUS | Status: AC
  Administered 2018-06-24: 550 mg via INTRAVENOUS
  Filled 2018-06-24: qty 55

## 2018-06-24 MED ORDER — SODIUM CHLORIDE 0.9 % IV SOLN
1000.0000 mg/m2 | Freq: Once | INTRAVENOUS | Status: AC
  Administered 2018-06-24: 2204 mg via INTRAVENOUS
  Filled 2018-06-24: qty 57.97

## 2018-06-24 MED ORDER — SODIUM CHLORIDE 0.9 % IV SOLN
Freq: Once | INTRAVENOUS | Status: AC
  Administered 2018-06-24: 10:00:00 via INTRAVENOUS
  Filled 2018-06-24: qty 250

## 2018-06-24 NOTE — Patient Instructions (Signed)
[facility] Discharge Instructions for Patients Receiving Chemotherapy  Today you received the following chemotherapy agents Gemzar and Carboplatin   To help prevent nausea and vomiting after your treatment, we encourage you to take your nausea medication as directed.    If you develop nausea and vomiting that is not controlled by your nausea medication, call the clinic.   BELOW ARE SYMPTOMS THAT SHOULD BE REPORTED IMMEDIATELY:  *FEVER GREATER THAN 100.5 F  *CHILLS WITH OR WITHOUT FEVER  NAUSEA AND VOMITING THAT IS NOT CONTROLLED WITH YOUR NAUSEA MEDICATION  *UNUSUAL SHORTNESS OF BREATH  *UNUSUAL BRUISING OR BLEEDING  TENDERNESS IN MOUTH AND THROAT WITH OR WITHOUT PRESENCE OF ULCERS  *URINARY PROBLEMS  *BOWEL PROBLEMS  UNUSUAL RASH Items with * indicate a potential emergency and should be followed up as soon as possible.  Feel free to call the clinic should you have any questions or concerns. The clinic phone number is [phone].  Please show the CHEMO ALERT CARD at check-in to the Emergency Department and triage nurse.   

## 2018-06-24 NOTE — Progress Notes (Signed)
Dr. Alen Blew aware of [place] and ALT. Advised ok to treat with no new orders. Patient declined influenza vaccine. Patient educated on the importance of being vaccinated and the heightened risks while immunocompromised. Patient verbalizes understanding.

## 2018-06-24 NOTE — Progress Notes (Signed)
Hematology and Oncology Follow Up Visit  Danny Wood [id number] [birth year] 67 y.o. 06/24/2018 8:53 AM [name] [name], [name], MD   Principle Diagnosis: 67 year old man with stage IV bladder cancer confirmed in 2019 with pelvic adenopathy.  He initially presented with T2N0 transitional cell carcinoma of the bladder diagnosed in May 2015.   Prior Therapy:  He underwent a cystoscopy and a TURBT on 05/17/2014.   Neoadjuvant systemic chemotherapy in the form of gemcitabine and cisplatin cycle 1 day 1 is on 06/17/2014. He is S/P two cycles completed in 07/2014.  Therapy tolerated poorly with symptoms of nausea and vomiting and worsening renal function.  He is status post robotic cystoprostatectomy and bilateral lymphadenectomy completed on September 09, 2014.  The final pathology revealed T2N0 without any lymph node involvement.  He developed pelvic adenopathy that was biopsy-proven on Jan 14, 2018 to be metastatic urothelial carcinoma.  Current therapy: Carboplatin and gemcitabine cycle 1 started on 02/17/2018.  He is here for cycle 7 of therapy.  Interim History: Danny Wood returns today for repeat evaluation.  Since her last visit, he reports no major changes in his health.  He continues to tolerate chemotherapy without any major complaints.  He denies any nausea, vomiting or infusion related complications.  He denies any peripheral neuropathy or weakness.  His appetite remain excellent and his quality of life is unchanged.  He does report some occasional sternal pain that is manageable with the current medication.  He does not report any headaches, blurred vision, syncope or seizures.  He denies any alteration in mental status or confusion.   He denies any fevers or chills or sweats.  He denies any chest pain, shortness of breath, palpitation, orthopnea.  He does not report any cough or wheezing or hemoptysis. He has not reported any nausea, vomiting.  He denies any  constipation or diarrhea. He denies any arthralgias or myalgias.  He has not reported any bleeding or clotting tendency.  He denies any skin skin rashes or lesions.  He denies any mood changes.  Denies any anxiety or depression.  Remainder of his review of systems is negative.   Medications: I have reviewed the patient's current medications.  Current Outpatient Medications  Medication Sig Dispense Refill  . lidocaine-prilocaine (EMLA) cream Apply 1 application topically as needed. 30 g 0  . ondansetron (ZOFRAN) 8 MG tablet Take 1 tablet (8 mg total) by mouth every 8 (eight) hours as needed for nausea or vomiting. 20 tablet 0  . oxyCODONE-acetaminophen (PERCOCET) 5-325 MG tablet Take 1-2 tablets by mouth every 6 (six) hours as needed for moderate pain or severe pain. 90 tablet 0  . prochlorperazine (COMPAZINE) 10 MG tablet Take 1 tablet (10 mg total) by mouth every 6 (six) hours as needed for nausea or vomiting. 30 tablet 0  . sulfamethoxazole-trimethoprim (BACTRIM DS,SEPTRA DS) 800-160 MG tablet Take 1 tablet by mouth 2 (two) times daily. 14 tablet 0   No current facility-administered medications for this visit.      Allergies: No Known Allergies  Past Medical History, Surgical history, Social history, and Family History were reviewed and updated.   Physical Exam:  Blood pressure 139/72, pulse 62, temperature 97.8 F (36.6 C), temperature source Oral, resp. rate 17, height 6' (1.829 m), weight 204 lb (92.5 kg), SpO2 100 %.    ECOG: 0   General appearance: Comfortable appearing without any discomfort Head: Normocephalic without any trauma Oropharynx: Mucous membranes are moist and pink without any thrush or ulcers.  Eyes: Pupils are equal and round reactive to light. Lymph nodes: No cervical, supraclavicular, inguinal or axillary lymphadenopathy.   Heart:regular rate and rhythm.  S1 and S2 without leg edema. Lung: Clear without any rhonchi or wheezes.  No dullness to  percussion. Abdomin: Soft, nontender, nondistended with good bowel sounds.  No hepatosplenomegaly. Musculoskeletal: No joint deformity or effusion.  Full range of motion noted. Neurological: No deficits noted on motor, sensory and deep tendon reflex exam. Skin: No petechial rash or dryness.  Appeared moist.       Lab Results: Lab Results  Component Value Date   WBC 3.5 (L) 06/03/2018   HGB 10.9 (L) 06/03/2018   HCT 30.8 (L) 06/03/2018   MCV 93.5 06/03/2018   PLT 132 (L) 06/03/2018     Chemistry      Component Value Date/Time   NA 138 06/03/2018 0847   NA 139 08/05/2014 1335   K 4.2 06/03/2018 0847   K 4.3 08/05/2014 1335   CL 102 06/03/2018 0847   CO2 27 06/03/2018 0847   CO2 26 08/05/2014 1335   BUN 16 06/03/2018 0847   BUN 18.2 08/05/2014 1335   CREATININE 1.03 06/03/2018 0847   CREATININE 1.1 08/05/2014 1335      Component Value Date/Time   CALCIUM 8.6 (L) 06/03/2018 0847   CALCIUM 9.5 08/05/2014 1335   ALKPHOS 111 06/03/2018 0847   ALKPHOS 98 08/05/2014 1335   AST 52 (H) 06/03/2018 0847   AST 45 (H) 08/05/2014 1335   ALT 78 (H) 06/03/2018 0847   ALT 73 (H) 08/05/2014 1335   BILITOT 0.6 06/03/2018 0847   BILITOT 0.47 08/05/2014 1335       Impression and Plan:  67 year old man with:    1.  Stage IV bladder cancer diagnosed in May 2019.  He was found to have high-grade urothelial carcinoma with pelvic adenopathy.   He is currently on chemotherapy utilizing carboplatin and gemcitabine with excellent tolerance.  Imaging studies in September 2019 showed excellent response to therapy.  Risks and benefits of continuing this treatment was reviewed today.  Long-term complication associated with that include bleeding, infections and renal toxicity among others were reviewed.  He is agreeable to continue the plan is to complete 8 cycles of therapy and repeat imaging studies after that.  He is agreeable with this plan.  2. Renal insufficiency: His kidney function  has normalized at this time with normal creatinine clearance.  3. Antiemetics: Compazine is available to him no issues with nausea and vomiting.  4.  IV access: Port-A-Cath remains in place and in use without any issues.  5.  Pain: Manageable at this time with Percocet and topical lidocaine.  6.  Dysphagia: Improved at this time and is able to maintain his nutrition.  His weight remains stable.  7.  Prognosis: Goal of treatment is palliative but his performance status is excellent and aggressive therapy is warranted.  8. Followup: Will be in 3 weeks for the next cycle of chemotherapy.  25  minutes was spent with the patient face-to-face today.  More than 50% of time was dedicated to discussing the natural course of his disease, treatment options and coordinating plan of care.  [name], MD 10/23/20198:53 AM

## 2018-06-24 NOTE — Telephone Encounter (Signed)
Printed avs and calender of upcoming appointment. Per 10/23 los 

## 2018-06-26 ENCOUNTER — Inpatient Hospital Stay: Payer: 59

## 2018-06-26 DIAGNOSIS — C679 Malignant neoplasm of bladder, unspecified: Secondary | ICD-10-CM

## 2018-06-26 DIAGNOSIS — Z5189 Encounter for other specified aftercare: Secondary | ICD-10-CM | POA: Diagnosis not present

## 2018-06-26 MED ORDER — PEGFILGRASTIM-CBQV 6 MG/0.6ML ~~LOC~~ SOSY
PREFILLED_SYRINGE | SUBCUTANEOUS | Status: AC
  Filled 2018-06-26: qty 0.6

## 2018-06-26 MED ORDER — PEGFILGRASTIM-CBQV 6 MG/0.6ML ~~LOC~~ SOSY
6.0000 mg | PREFILLED_SYRINGE | Freq: Once | SUBCUTANEOUS | Status: AC
  Administered 2018-06-26: 6 mg via SUBCUTANEOUS

## 2018-06-26 NOTE — Patient Instructions (Signed)
Pegfilgrastim injection What is this medicine? PEGFILGRASTIM (PEG fil gra stim) is a long-acting granulocyte colony-stimulating factor that stimulates the growth of neutrophils, a type of white blood cell important in the body's fight against infection. It is used to reduce the incidence of fever and infection in patients with certain types of cancer who are receiving chemotherapy that affects the bone marrow, and to increase survival after being exposed to high doses of radiation. This medicine may be used for other purposes; ask your health care provider or pharmacist if you have questions. COMMON BRAND NAME(S): Neulasta What should I tell my health care provider before I take this medicine? They need to know if you have any of these conditions: -kidney disease -latex allergy -ongoing radiation therapy -sickle cell disease -skin reactions to acrylic adhesives (On-Body Injector only) -an unusual or allergic reaction to pegfilgrastim, filgrastim, other medicines, foods, dyes, or preservatives -pregnant or trying to get pregnant -breast-feeding How should I use this medicine? This medicine is for injection under the skin. If you get this medicine at home, you will be taught how to prepare and give the pre-filled syringe or how to use the On-body Injector. Refer to the patient Instructions for Use for detailed instructions. Use exactly as directed. Tell your healthcare provider immediately if you suspect that the On-body Injector may not have performed as intended or if you suspect the use of the On-body Injector resulted in a missed or partial dose. It is important that you put your used needles and syringes in a special sharps container. Do not put them in a trash can. If you do not have a sharps container, call your pharmacist or healthcare provider to get one. Talk to your pediatrician regarding the use of this medicine in children. While this drug may be prescribed for selected conditions,  precautions do apply. Overdosage: If you think you have taken too much of this medicine contact a poison control center or emergency room at once. NOTE: This medicine is only for you. Do not share this medicine with others. What if I miss a dose? It is important not to miss your dose. Call your doctor or health care professional if you miss your dose. If you miss a dose due to an On-body Injector failure or leakage, a new dose should be administered as soon as possible using a single prefilled syringe for manual use. What may interact with this medicine? Interactions have not been studied. Give your health care provider a list of all the medicines, herbs, non-prescription drugs, or dietary supplements you use. Also tell them if you smoke, drink alcohol, or use illegal drugs. Some items may interact with your medicine. This list may not describe all possible interactions. Give your health care provider a list of all the medicines, herbs, non-prescription drugs, or dietary supplements you use. Also tell them if you smoke, drink alcohol, or use illegal drugs. Some items may interact with your medicine. What should I watch for while using this medicine? You may need blood work done while you are taking this medicine. If you are going to need a MRI, CT scan, or other procedure, tell your doctor that you are using this medicine (On-Body Injector only). What side effects may I notice from receiving this medicine? Side effects that you should report to your doctor or health care professional as soon as possible: -allergic reactions like skin rash, itching or hives, swelling of the face, lips, or tongue -dizziness -fever -pain, redness, or irritation at site   where injected -pinpoint red spots on the skin -red or dark-brown urine -shortness of breath or breathing problems -stomach or side pain, or pain at the shoulder -swelling -tiredness -trouble passing urine or change in the amount of urine Side  effects that usually do not require medical attention (report to your doctor or health care professional if they continue or are bothersome): -bone pain -muscle pain This list may not describe all possible side effects. Call your doctor for medical advice about side effects. You may report side effects to FDA at 1-800-FDA-1088. Where should I keep my medicine? Keep out of the reach of children. Store pre-filled syringes in a refrigerator between 2 and 8 degrees C (36 and 46 degrees F). Do not freeze. Keep in carton to protect from light. Throw away this medicine if it is left out of the refrigerator for more than 48 hours. Throw away any unused medicine after the expiration date. NOTE: This sheet is a summary. It may not cover all possible information. If you have questions about this medicine, talk to your doctor, pharmacist, or health care provider.  2018 Elsevier/Gold Standard (2016-08-15 12:58:03)  

## 2018-06-29 ENCOUNTER — Other Ambulatory Visit: Payer: Self-pay | Admitting: Oncology

## 2018-07-01 ENCOUNTER — Encounter: Payer: Self-pay | Admitting: Family Medicine

## 2018-07-22 ENCOUNTER — Inpatient Hospital Stay (HOSPITAL_BASED_OUTPATIENT_CLINIC_OR_DEPARTMENT_OTHER): Payer: 59 | Admitting: Oncology

## 2018-07-22 ENCOUNTER — Telehealth: Payer: Self-pay

## 2018-07-22 ENCOUNTER — Inpatient Hospital Stay: Payer: 59 | Attending: Oncology

## 2018-07-22 ENCOUNTER — Inpatient Hospital Stay: Payer: 59

## 2018-07-22 VITALS — BP 146/75 | HR 79 | Temp 98.2°F | Resp 17 | Ht 72.0 in | Wt 203.4 lb

## 2018-07-22 DIAGNOSIS — C679 Malignant neoplasm of bladder, unspecified: Secondary | ICD-10-CM | POA: Insufficient documentation

## 2018-07-22 DIAGNOSIS — N289 Disorder of kidney and ureter, unspecified: Secondary | ICD-10-CM | POA: Diagnosis not present

## 2018-07-22 DIAGNOSIS — Z79899 Other long term (current) drug therapy: Secondary | ICD-10-CM | POA: Diagnosis not present

## 2018-07-22 DIAGNOSIS — R1310 Dysphagia, unspecified: Secondary | ICD-10-CM | POA: Insufficient documentation

## 2018-07-22 DIAGNOSIS — Z5111 Encounter for antineoplastic chemotherapy: Secondary | ICD-10-CM | POA: Diagnosis not present

## 2018-07-22 DIAGNOSIS — R530 Neoplastic (malignant) related fatigue: Secondary | ICD-10-CM | POA: Insufficient documentation

## 2018-07-22 DIAGNOSIS — R0789 Other chest pain: Secondary | ICD-10-CM

## 2018-07-22 DIAGNOSIS — Z95828 Presence of other vascular implants and grafts: Secondary | ICD-10-CM

## 2018-07-22 DIAGNOSIS — Z5189 Encounter for other specified aftercare: Secondary | ICD-10-CM | POA: Insufficient documentation

## 2018-07-22 LAB — CMP (CANCER CENTER ONLY)
ALT: 116 U/L — ABNORMAL HIGH (ref 0–44)
AST: 69 U/L — ABNORMAL HIGH (ref 15–41)
Albumin: 3.3 g/dL — ABNORMAL LOW (ref 3.5–5.0)
Alkaline Phosphatase: 147 U/L — ABNORMAL HIGH (ref 38–126)
Anion gap: 8 (ref 5–15)
BUN: 12 mg/dL (ref 8–23)
CO2: 28 mmol/L (ref 22–32)
Calcium: 8.6 mg/dL — ABNORMAL LOW (ref 8.9–10.3)
Chloride: 100 mmol/L (ref 98–111)
Creatinine: 1.08 mg/dL (ref 0.61–1.24)
GFR, Est AFR Am: >60 mL/min (ref >60)
GFR, Estimated: >60 mL/min (ref >60)
Glucose, Bld: 246 mg/dL — ABNORMAL HIGH (ref 70–99)
Potassium: 4.1 mmol/L (ref 3.5–5.1)
Sodium: 136 mmol/L (ref 135–145)
Total Bilirubin: 0.4 mg/dL (ref 0.3–1.2)
Total Protein: 6.7 g/dL (ref 6.5–8.1)

## 2018-07-22 LAB — CBC WITH DIFFERENTIAL/PLATELET
Abs Immature Granulocytes: 0.02 10*3/uL (ref 0.00–0.07)
Basophils Absolute: 0 10*3/uL (ref 0.0–0.1)
Basophils Relative: 0 %
Eosinophils Absolute: 0 10*3/uL (ref 0.0–0.5)
Eosinophils Relative: 1 %
HCT: 31.8 % — ABNORMAL LOW (ref 39.0–52.0)
Hemoglobin: 11.2 g/dL — ABNORMAL LOW (ref 13.0–17.0)
Immature Granulocytes: 0 %
Lymphocytes Relative: 20 %
Lymphs Abs: 1.3 10*3/uL (ref 0.7–4.0)
MCH: 33.5 pg (ref 26.0–34.0)
MCHC: 35.2 g/dL (ref 30.0–36.0)
MCV: 95.2 fL (ref 80.0–100.0)
Monocytes Absolute: 0.9 10*3/uL (ref 0.1–1.0)
Monocytes Relative: 14 %
Neutro Abs: 4.1 10*3/uL (ref 1.7–7.7)
Neutrophils Relative %: 65 %
Platelets: 168 10*3/uL (ref 150–400)
RBC: 3.34 MIL/uL — ABNORMAL LOW (ref 4.22–5.81)
RDW: 14.7 % (ref 11.5–15.5)
WBC: 6.3 10*3/uL (ref 4.0–10.5)
nRBC: 0 % (ref 0.0–0.2)

## 2018-07-22 MED ORDER — SODIUM CHLORIDE 0.9 % IV SOLN
Freq: Once | INTRAVENOUS | Status: AC
  Administered 2018-07-22: 14:00:00 via INTRAVENOUS
  Filled 2018-07-22: qty 250

## 2018-07-22 MED ORDER — DEXAMETHASONE SODIUM PHOSPHATE 10 MG/ML IJ SOLN
10.0000 mg | Freq: Once | INTRAMUSCULAR | Status: AC
  Administered 2018-07-22: 10 mg via INTRAVENOUS

## 2018-07-22 MED ORDER — DEXAMETHASONE SODIUM PHOSPHATE 10 MG/ML IJ SOLN
INTRAMUSCULAR | Status: AC
  Filled 2018-07-22: qty 1

## 2018-07-22 MED ORDER — SODIUM CHLORIDE 0.9% FLUSH
10.0000 mL | INTRAVENOUS | Status: DC | PRN
  Administered 2018-07-22: 10 mL
  Filled 2018-07-22: qty 10

## 2018-07-22 MED ORDER — SODIUM CHLORIDE 0.9% FLUSH
10.0000 mL | Freq: Once | INTRAVENOUS | Status: AC
  Administered 2018-07-22: 10 mL
  Filled 2018-07-22: qty 10

## 2018-07-22 MED ORDER — PALONOSETRON HCL INJECTION 0.25 MG/5ML
0.2500 mg | Freq: Once | INTRAVENOUS | Status: AC
  Administered 2018-07-22: 0.25 mg via INTRAVENOUS

## 2018-07-22 MED ORDER — PALONOSETRON HCL INJECTION 0.25 MG/5ML
INTRAVENOUS | Status: AC
  Filled 2018-07-22: qty 5

## 2018-07-22 MED ORDER — FAMOTIDINE IN NACL 20-0.9 MG/50ML-% IV SOLN
20.0000 mg | Freq: Once | INTRAVENOUS | Status: AC
  Administered 2018-07-22: 20 mg via INTRAVENOUS

## 2018-07-22 MED ORDER — SODIUM CHLORIDE 0.9 % IV SOLN
1000.0000 mg/m2 | Freq: Once | INTRAVENOUS | Status: AC
  Administered 2018-07-22: 2204 mg via INTRAVENOUS
  Filled 2018-07-22: qty 57.97

## 2018-07-22 MED ORDER — DIPHENHYDRAMINE HCL 25 MG PO TABS
25.0000 mg | ORAL_TABLET | Freq: Once | ORAL | Status: AC
  Administered 2018-07-22: 25 mg via ORAL
  Filled 2018-07-22: qty 1

## 2018-07-22 MED ORDER — HEPARIN SOD (PORK) LOCK FLUSH 100 UNIT/ML IV SOLN
500.0000 [IU] | Freq: Once | INTRAVENOUS | Status: AC | PRN
  Administered 2018-07-22: 500 [IU]
  Filled 2018-07-22: qty 5

## 2018-07-22 MED ORDER — SODIUM CHLORIDE 0.9 % IV SOLN
550.0000 mg | Freq: Once | INTRAVENOUS | Status: AC
  Administered 2018-07-22: 550 mg via INTRAVENOUS
  Filled 2018-07-22: qty 55

## 2018-07-22 MED ORDER — DIPHENHYDRAMINE HCL 25 MG PO CAPS
ORAL_CAPSULE | ORAL | Status: AC
  Filled 2018-07-22: qty 1

## 2018-07-22 MED ORDER — FAMOTIDINE IN NACL 20-0.9 MG/50ML-% IV SOLN
INTRAVENOUS | Status: AC
  Filled 2018-07-22: qty 50

## 2018-07-22 NOTE — Progress Notes (Signed)
Hematology and Oncology Follow Up Visit  Danny Wood [id number] August 04, 1951 67 y.o. 07/22/2018 12:52 PM Danny Wood, [name], MD   Principle Diagnosis: 67 year old man with T2N0 bladder cancer diagnosed in May 2015.  He developed stage IV with a pelvic adenopathy in 2019 with pelvic adenopathy.    Prior Therapy:  He underwent a cystoscopy and a TURBT on 05/17/2014.   Neoadjuvant systemic chemotherapy in the form of gemcitabine and cisplatin cycle 1 day 1 is on 06/17/2014. He is S/P two cycles completed in 07/2014.  Therapy tolerated poorly with symptoms of nausea and vomiting and worsening renal function.  He is status post robotic cystoprostatectomy and bilateral lymphadenectomy completed on September 09, 2014.  The final pathology revealed T2N0 without any lymph node involvement.  He developed pelvic adenopathy that was biopsy-proven on Jan 14, 2018 to be metastatic urothelial carcinoma.  Current therapy: Carboplatin and gemcitabine cycle 1 started on 02/17/2018.  He is here for cycle 8 of therapy.  Interim History: [name] presents today for a follow-up.  Since the last visit, he reports no major changes in his health.  He has tolerated last cycle of chemotherapy without any major complications.  He has reported some mild fatigue and decrease in his exercise tolerance.  He does report dysphagia which has not changed dramatically although he is maintaining his weight eating soft food.  Referral to gastroenterology is currently pending.  His quality of life and performance status remains unchanged.  He does not report any headaches, blurred vision, syncope or seizures.  He denies any dizziness or lethargy.   He denies any fevers or chills or sweats.  He denies any chest pain, shortness of breath, palpitation, orthopnea.  He does not report any cough or wheezing or hemoptysis. He has not reported any nausea, vomiting.  Denies any early satiety or abdominal distention.  He  denies any change in bowel habits. He denies any bone pain or pathological fractures.  He has not reported any ecchymosis or petechiae.  He denies any bleeding or clotting tendency.  He denies any mood changes.  Denies any heat or cold intolerance.  Remainder of his review of systems is negative.   Medications: I have reviewed the patient's current medications.  Current Outpatient Medications  Medication Sig Dispense Refill  . lidocaine-prilocaine (EMLA) cream Apply 1 application topically as needed. 30 g 0  . ondansetron (ZOFRAN) 8 MG tablet Take 1 tablet (8 mg total) by mouth every 8 (eight) hours as needed for nausea or vomiting. 20 tablet 0  . oxyCODONE-acetaminophen (PERCOCET) 5-325 MG tablet Take 1-2 tablets by mouth every 6 (six) hours as needed for moderate pain or severe pain. 90 tablet 0  . prochlorperazine (COMPAZINE) 10 MG tablet Take 1 tablet (10 mg total) by mouth every 6 (six) hours as needed for nausea or vomiting. 30 tablet 0   No current facility-administered medications for this visit.      Allergies: No Known Allergies  Past Medical History, Surgical history, Social history, and Family History were reviewed and updated.   Physical Exam:  Vitals personally reviewed today which showed blood pressure 141/75, pulse 98, respiration 20 with 203.4 pounds       ECOG: 0   General appearance: Alert, awake without any distress. Head: Atraumatic without abnormalities Oropharynx: Without any thrush or ulcers. Eyes: No scleral icterus. Lymph nodes: No lymphadenopathy noted in the cervical, supraclavicular, or axillary nodes Heart:regular rate and rhythm, without any murmurs or gallops.   Lung:  Clear to auscultation without any rhonchi, wheezes or dullness to percussion. Abdomin: Soft, nontender without any shifting dullness or ascites. Musculoskeletal: No clubbing or cyanosis. Neurological: No motor or sensory deficits. Skin: No rashes or lesions. Psychiatric: Mood and  affect appeared normal.        Lab Results: Lab Results  Component Value Date   WBC 2.3 (L) 06/24/2018   HGB 10.4 (L) 06/24/2018   HCT 29.0 (L) 06/24/2018   MCV 94.5 06/24/2018   PLT 132 (L) 06/24/2018     Chemistry      Component Value Date/Time   NA 139 06/24/2018 0838   NA 139 08/05/2014 1335   K 4.2 06/24/2018 0838   K 4.3 08/05/2014 1335   CL 104 06/24/2018 0838   CO2 26 06/24/2018 0838   CO2 26 08/05/2014 1335   BUN 12 06/24/2018 0838   BUN 18.2 08/05/2014 1335   CREATININE 0.98 06/24/2018 0838   CREATININE 1.1 08/05/2014 1335      Component Value Date/Time   CALCIUM 8.6 (L) 06/24/2018 0838   CALCIUM 9.5 08/05/2014 1335   ALKPHOS 103 06/24/2018 0838   ALKPHOS 98 08/05/2014 1335   AST 58 (H) 06/24/2018 0838   AST 45 (H) 08/05/2014 1335   ALT 106 (H) 06/24/2018 0838   ALT 73 (H) 08/05/2014 1335   BILITOT 0.6 06/24/2018 0838   BILITOT 0.47 08/05/2014 1335       Impression and Plan:  67 year old man with:    1.  High-grade urothelial carcinoma of the bladder with metastatic disease with pelvic adenopathy in May 2019.    He continues to tolerate chemotherapy utilizing gemcitabine and carboplatin without any new toxicities.  Risks and benefits of continuing this therapy was discussed today and I recommended completing 8 cycles of therapy and repeat imaging studies in the near future.  He is agreeable to proceed with this plan.  2. Renal insufficiency: No issues reported with his kidney function on carboplatin.  3. Antiemetics: No nausea or vomiting reported at this time Compazine is available to him.  4.  IV access: Port-A-Cath remains in use without any complications.  5.  Pain: Chest wall pain has been manageable with lidocaine and Percocet.  6.  Dysphagia: I recommended follow-up with gastroenterology for an endoscopy in the future.  7.  Prognosis: He continues to have excellent performance status and aggressive therapy is warranted.  Treatment  however remains palliative.  8. Followup: Will be in 4 weeks after repeat imaging studies.  25  minutes was spent with the patient face-to-face today.  More than 50% of time was dedicated to reviewing his disease status, treatment options, long-term complications with therapy and answering questions regarding future plan of care.   [name], MD 11/20/201912:52 PM

## 2018-07-22 NOTE — Telephone Encounter (Signed)
Printed avs and calender of upcoming appointment. Per 11/20 los 

## 2018-07-22 NOTE — Patient Instructions (Signed)
[facility] Discharge Instructions for Patients Receiving Chemotherapy  Today you received the following chemotherapy agents Gemzar and Carboplatin   To help prevent nausea and vomiting after your treatment, we encourage you to take your nausea medication as directed.    If you develop nausea and vomiting that is not controlled by your nausea medication, call the clinic.   BELOW ARE SYMPTOMS THAT SHOULD BE REPORTED IMMEDIATELY:  *FEVER GREATER THAN 100.5 F  *CHILLS WITH OR WITHOUT FEVER  NAUSEA AND VOMITING THAT IS NOT CONTROLLED WITH YOUR NAUSEA MEDICATION  *UNUSUAL SHORTNESS OF BREATH  *UNUSUAL BRUISING OR BLEEDING  TENDERNESS IN MOUTH AND THROAT WITH OR WITHOUT PRESENCE OF ULCERS  *URINARY PROBLEMS  *BOWEL PROBLEMS  UNUSUAL RASH Items with * indicate a potential emergency and should be followed up as soon as possible.  Feel free to call the clinic should you have any questions or concerns. The clinic phone number is [phone].  Please show the CHEMO ALERT CARD at check-in to the Emergency Department and triage nurse.   

## 2018-07-24 ENCOUNTER — Inpatient Hospital Stay: Payer: 59

## 2018-07-24 VITALS — BP 144/78 | HR 80 | Temp 98.1°F | Resp 18

## 2018-07-24 DIAGNOSIS — Z5189 Encounter for other specified aftercare: Secondary | ICD-10-CM | POA: Diagnosis not present

## 2018-07-24 DIAGNOSIS — C679 Malignant neoplasm of bladder, unspecified: Secondary | ICD-10-CM

## 2018-07-24 MED ORDER — PEGFILGRASTIM INJECTION 6 MG/0.6ML ~~LOC~~
6.0000 mg | PREFILLED_SYRINGE | Freq: Once | SUBCUTANEOUS | Status: AC
  Administered 2018-07-24: 6 mg via SUBCUTANEOUS

## 2018-07-24 MED ORDER — PEGFILGRASTIM INJECTION 6 MG/0.6ML ~~LOC~~
PREFILLED_SYRINGE | SUBCUTANEOUS | Status: AC
  Filled 2018-07-24: qty 0.6

## 2018-07-28 DIAGNOSIS — C778 Secondary and unspecified malignant neoplasm of lymph nodes of multiple regions: Secondary | ICD-10-CM | POA: Diagnosis not present

## 2018-07-28 DIAGNOSIS — C678 Malignant neoplasm of overlapping sites of bladder: Secondary | ICD-10-CM | POA: Diagnosis not present

## 2018-08-21 ENCOUNTER — Inpatient Hospital Stay: Payer: 59 | Attending: Oncology

## 2018-08-21 ENCOUNTER — Ambulatory Visit (HOSPITAL_COMMUNITY)
Admission: RE | Admit: 2018-08-21 | Discharge: 2018-08-21 | Disposition: A | Discharge status: Home / Self Care | Payer: 59 | Source: Ambulatory Visit | Attending: Oncology | Admitting: Oncology

## 2018-08-21 ENCOUNTER — Inpatient Hospital Stay: Payer: 59

## 2018-08-21 DIAGNOSIS — Z95828 Presence of other vascular implants and grafts: Secondary | ICD-10-CM

## 2018-08-21 DIAGNOSIS — C679 Malignant neoplasm of bladder, unspecified: Secondary | ICD-10-CM | POA: Insufficient documentation

## 2018-08-21 DIAGNOSIS — D6481 Anemia due to antineoplastic chemotherapy: Secondary | ICD-10-CM | POA: Insufficient documentation

## 2018-08-21 DIAGNOSIS — C7802 Secondary malignant neoplasm of left lung: Secondary | ICD-10-CM | POA: Diagnosis not present

## 2018-08-21 DIAGNOSIS — Z79899 Other long term (current) drug therapy: Secondary | ICD-10-CM | POA: Insufficient documentation

## 2018-08-21 DIAGNOSIS — Z9221 Personal history of antineoplastic chemotherapy: Secondary | ICD-10-CM | POA: Insufficient documentation

## 2018-08-21 DIAGNOSIS — R1310 Dysphagia, unspecified: Secondary | ICD-10-CM | POA: Insufficient documentation

## 2018-08-21 LAB — CMP (CANCER CENTER ONLY)
ALT: 100 U/L — ABNORMAL HIGH (ref 0–44)
AST: 59 U/L — ABNORMAL HIGH (ref 15–41)
Albumin: 2.9 g/dL — ABNORMAL LOW (ref 3.5–5.0)
Alkaline Phosphatase: 166 U/L — ABNORMAL HIGH (ref 38–126)
Anion gap: 9 (ref 5–15)
BUN: 12 mg/dL (ref 8–23)
CO2: 27 mmol/L (ref 22–32)
Calcium: 8.2 mg/dL — ABNORMAL LOW (ref 8.9–10.3)
Chloride: 99 mmol/L (ref 98–111)
Creatinine: 1.02 mg/dL (ref 0.61–1.24)
GFR, Est AFR Am: >60 mL/min (ref >60)
GFR, Estimated: >60 mL/min (ref >60)
Glucose, Bld: 325 mg/dL — ABNORMAL HIGH (ref 70–99)
Potassium: 4 mmol/L (ref 3.5–5.1)
Sodium: 135 mmol/L (ref 135–145)
Total Bilirubin: 0.4 mg/dL (ref 0.3–1.2)
Total Protein: 6.7 g/dL (ref 6.5–8.1)

## 2018-08-21 LAB — CBC WITH DIFFERENTIAL (CANCER CENTER ONLY)
Abs Immature Granulocytes: 0.02 10*3/uL (ref 0.00–0.07)
Basophils Absolute: 0 10*3/uL (ref 0.0–0.1)
Basophils Relative: 0 %
Eosinophils Absolute: 0 10*3/uL (ref 0.0–0.5)
Eosinophils Relative: 1 %
HCT: 29.8 % — ABNORMAL LOW (ref 39.0–52.0)
Hemoglobin: 10.5 g/dL — ABNORMAL LOW (ref 13.0–17.0)
Immature Granulocytes: 0 %
Lymphocytes Relative: 19 %
Lymphs Abs: 1 10*3/uL (ref 0.7–4.0)
MCH: 33.1 pg (ref 26.0–34.0)
MCHC: 35.2 g/dL (ref 30.0–36.0)
MCV: 94 fL (ref 80.0–100.0)
Monocytes Absolute: 0.5 10*3/uL (ref 0.1–1.0)
Monocytes Relative: 10 %
Neutro Abs: 3.6 10*3/uL (ref 1.7–7.7)
Neutrophils Relative %: 70 %
Platelet Count: 163 10*3/uL (ref 150–400)
RBC: 3.17 MIL/uL — ABNORMAL LOW (ref 4.22–5.81)
RDW: 12.6 % (ref 11.5–15.5)
WBC Count: 5.2 10*3/uL (ref 4.0–10.5)
nRBC: 0 % (ref 0.0–0.2)

## 2018-08-21 MED ORDER — SODIUM CHLORIDE 0.9% FLUSH
10.0000 mL | Freq: Once | INTRAVENOUS | Status: AC
  Administered 2018-08-21: 10 mL
  Filled 2018-08-21: qty 10

## 2018-08-21 MED ORDER — HEPARIN SOD (PORK) LOCK FLUSH 100 UNIT/ML IV SOLN
500.0000 [IU] | Freq: Once | INTRAVENOUS | Status: AC
  Administered 2018-08-21: 500 [IU] via INTRAVENOUS

## 2018-08-21 MED ORDER — IOHEXOL 300 MG/ML  SOLN
100.0000 mL | Freq: Once | INTRAMUSCULAR | Status: AC | PRN
  Administered 2018-08-21: 100 mL via INTRAVENOUS

## 2018-08-21 MED ORDER — HEPARIN SOD (PORK) LOCK FLUSH 100 UNIT/ML IV SOLN
500.0000 [IU] | Freq: Once | INTRAVENOUS | Status: AC
  Administered 2018-08-21: 500 [IU]
  Filled 2018-08-21: qty 5

## 2018-08-21 MED ORDER — SODIUM CHLORIDE (PF) 0.9 % IJ SOLN
INTRAMUSCULAR | Status: AC
  Filled 2018-08-21: qty 50

## 2018-08-21 MED ORDER — HEPARIN SOD (PORK) LOCK FLUSH 100 UNIT/ML IV SOLN
INTRAVENOUS | Status: AC
  Filled 2018-08-21: qty 5

## 2018-08-21 NOTE — Patient Instructions (Signed)

## 2018-08-25 ENCOUNTER — Inpatient Hospital Stay (HOSPITAL_BASED_OUTPATIENT_CLINIC_OR_DEPARTMENT_OTHER): Payer: 59 | Admitting: Oncology

## 2018-08-25 ENCOUNTER — Telehealth: Payer: Self-pay | Admitting: Oncology

## 2018-08-25 VITALS — BP 143/78 | HR 93 | Temp 98.2°F | Resp 16 | Ht 73.0 in | Wt 196.8 lb

## 2018-08-25 DIAGNOSIS — Z9221 Personal history of antineoplastic chemotherapy: Secondary | ICD-10-CM

## 2018-08-25 DIAGNOSIS — R1310 Dysphagia, unspecified: Secondary | ICD-10-CM

## 2018-08-25 DIAGNOSIS — C7802 Secondary malignant neoplasm of left lung: Secondary | ICD-10-CM | POA: Diagnosis not present

## 2018-08-25 DIAGNOSIS — D6481 Anemia due to antineoplastic chemotherapy: Secondary | ICD-10-CM

## 2018-08-25 DIAGNOSIS — Z79899 Other long term (current) drug therapy: Secondary | ICD-10-CM

## 2018-08-25 DIAGNOSIS — C679 Malignant neoplasm of bladder, unspecified: Secondary | ICD-10-CM

## 2018-08-25 NOTE — Progress Notes (Signed)
Hematology and Oncology Follow Up Visit  Zyshawn Bohnenkamp Mcelwain [id number] 05/15/1951 67 y.o. 08/25/2018 2:54 PM [name], [name], MD   Principle Diagnosis: 67 year old man with stage IV bladder cancer with pelvic adenopathy documented in 2019.  He initially presented with T2N0 bladder cancer diagnosed in May 2015.  Marland Kitchen    Prior Therapy:  He underwent a cystoscopy and a TURBT on 05/17/2014.   Neoadjuvant systemic chemotherapy in the form of gemcitabine and cisplatin cycle 1 day 1 is on 06/17/2014. He is S/P two cycles completed in 07/2014.  Therapy tolerated poorly with symptoms of nausea and vomiting and worsening renal function.  He is status post robotic cystoprostatectomy and bilateral lymphadenectomy completed on September 09, 2014.  The final pathology revealed T2N0 without any lymph node involvement.  He developed pelvic adenopathy that was biopsy-proven on Jan 14, 2018 to be metastatic urothelial carcinoma.  Carboplatin and gemcitabine cycle 1 started on 02/17/2018.  He completed 8 cycles of therapy in December 2019.   Current therapy: Active surveillance.  Interim History: Mr. Cieslinski returns today for a repeat evaluation.  Since the last visit, he reports no major complaints.  He is recovering well from his last cycle of chemotherapy although his appetite still low.  He has lost few pounds since last visit without any dysphasia or dyspepsia.  He reports his dysphagia has improved at this time.  His performance status and quality of life is improving and his energy is also improving slowly.  He denies any active bleeding including no hematuria or hematochezia.  He does not report any headaches, blurred vision, syncope or seizures.  He denies any alteration of mental status or confusion.   He denies any fevers or chills or sweats.  He denies any chest pain, shortness of breath, palpitation, orthopnea.  He does not report any cough or wheezing or hemoptysis. He has not  reported any nausea, vomiting.  No constipation or diarrhea.  He denies any arthralgias or myalgias.  He has not reported any skin rashes or lesions.  He denies any chemosis or petechiae.  He denies any Zaidi or depression.  Remainder of his review of systems is negative.   Medications: I have reviewed the patient's current medications.  Current Outpatient Medications  Medication Sig Dispense Refill  . lidocaine-prilocaine (EMLA) cream Apply 1 application topically as needed. 30 g 0  . ondansetron (ZOFRAN) 8 MG tablet Take 1 tablet (8 mg total) by mouth every 8 (eight) hours as needed for nausea or vomiting. 20 tablet 0  . oxyCODONE-acetaminophen (PERCOCET) 5-325 MG tablet Take 1-2 tablets by mouth every 6 (six) hours as needed for moderate pain or severe pain. 90 tablet 0   No current facility-administered medications for this visit.      Allergies: No Known Allergies  Past Medical History, Surgical history, Social history, and Family History were reviewed and updated.   Physical Exam:  Blood pressure (!) 143/78, pulse 93, temperature 98.2 F (36.8 C), temperature source Oral, resp. rate 16, height 6\' 1"  (1.854 m), weight 196 lb 12.8 oz (89.3 kg), SpO2 100 %.         ECOG: 0  . General appearance: Comfortable appearing without any discomfort Head: Normocephalic without any trauma Oropharynx: Mucous membranes are moist and pink without any thrush or ulcers. Eyes: Pupils are equal and round reactive to light. Lymph nodes: No cervical, supraclavicular, inguinal or axillary lymphadenopathy.   Heart:regular rate and rhythm.  S1 and S2 without leg edema. Lung: Clear  without any rhonchi or wheezes.  No dullness to percussion. Abdomin: Soft, nontender, nondistended with good bowel sounds.  No hepatosplenomegaly. Musculoskeletal: No joint deformity or effusion.  Full range of motion noted. Neurological: No deficits noted on motor, sensory and deep tendon reflex exam. Skin: No  petechial rash or dryness.  Appeared moist.          Lab Results: Lab Results  Component Value Date   WBC 5.2 08/21/2018   HGB 10.5 (L) 08/21/2018   HCT 29.8 (L) 08/21/2018   MCV 94.0 08/21/2018   PLT 163 08/21/2018     Chemistry      Component Value Date/Time   NA 135 08/21/2018 1313   NA 139 08/05/2014 1335   K 4.0 08/21/2018 1313   K 4.3 08/05/2014 1335   CL 99 08/21/2018 1313   CO2 27 08/21/2018 1313   CO2 26 08/05/2014 1335   BUN 12 08/21/2018 1313   BUN 18.2 08/05/2014 1335   CREATININE 1.02 08/21/2018 1313   CREATININE 1.1 08/05/2014 1335      Component Value Date/Time   CALCIUM 8.2 (L) 08/21/2018 1313   CALCIUM 9.5 08/05/2014 1335   ALKPHOS 166 (H) 08/21/2018 1313   ALKPHOS 98 08/05/2014 1335   AST 59 (H) 08/21/2018 1313   AST 45 (H) 08/05/2014 1335   ALT 100 (H) 08/21/2018 1313   ALT 73 (H) 08/05/2014 1335   BILITOT 0.4 08/21/2018 1313   BILITOT 0.47 08/05/2014 1335     EXAM: CT CHEST, ABDOMEN, AND PELVIS WITH CONTRAST  TECHNIQUE: Multidetector CT imaging of the chest, abdomen and pelvis was performed following the standard protocol during bolus administration of intravenous contrast.  CONTRAST:  162mL OMNIPAQUE IOHEXOL 300 MG/ML  SOLN  COMPARISON:  AP CT on 05/12/2018, and chest CT on 03/09/2018  FINDINGS: CT CHEST FINDINGS  Cardiovascular: No acute findings. Aortic and coronary artery atherosclerosis.  Mediastinum/Lymph Nodes: No masses or pathologically enlarged lymph nodes identified.  Lungs/Pleura: Decreased size of pulmonary nodule in left lung apex, currently measuring 4 mm on image 28/6 compared to 7 mm previously. 3 mm anterior right upper lobe pulmonary nodule on image 67/6 remains stable. No new or enlarging pulmonary nodules or masses identified. No evidence of pulmonary infiltrate or pleural effusion.  Musculoskeletal: Stable small sclerotic bone lesion in left anterior 2nd rib, likely a bone island. No other bone  lesions identified.  CT ABDOMEN AND PELVIS FINDINGS  Hepatobiliary: No masses identified. Gallbladder is unremarkable.  Pancreas:  No mass or inflammatory changes.  Spleen:  Within normal limits in size and appearance.  Adrenals/Urinary tract: Normal adrenal glands. Stable left renal cyst. Several ill-defined areas decreased parenchymal enhancement are seen in the upper and lower poles of the right kidney which are new and suspicious for pyelonephritis. No evidence of renal abscess or perinephric fluid. No evidence of hydronephrosis. Stable postop changes from cystoprostatectomy and ileal conduit.  Stomach/Bowel: No evidence of obstruction, inflammatory process, or abnormal fluid collections. Normal appendix visualized.  Vascular/Lymphatic: Mild retroperitoneal lymphadenopathy in left paraaortic region is stable, with largest lymph node measuring 1.3 cm on image 87/2. No new or increased areas of lymphadenopathy identified. Aortic atherosclerosis. No abdominal aortic aneurysm.  Reproductive: Stable postop changes from cystoprostatectomy. No evidence recurrent mass within the surgical bed.  Other:  None.  Musculoskeletal:   No suspicious bone lesions identified.  IMPRESSION: Stable mild retroperitoneal lymphadenopathy in left paraaortic region. No new or progressive metastatic disease identified.  Mild decrease in size of sub-cm pulmonary  nodule in left lung apex. Other 3 mm left upper lobe pulmonary nodule remains stable. Recommend continued attention on follow-up imaging.  New ill-defined areas of decreased parenchymal enhancement in right kidney, highly suspicious for pyelonephritis. No evidence of hydronephrosis.   Impression and Plan:  67 year old man with:    1.  Stage IV bladder cancer with pelvic adenopathy diagnosed in May 2019.    He completed 8 cycles of chemotherapy utilizing carboplatin and gemcitabine with excellent response to  therapy.  CT scan obtained on 08/21/2018 was personally reviewed today and showed very little adenopathy in the left periaortic region and regression of his pulmonary metastasis.  The natural course of his disease was reviewed today and options of care were discussed.  At this time I recommended active surveillance and restart chemotherapy if he develops progression of disease.  Anticipate Pete imaging studies in 6 months  2.  IV access: Port-A-Cath will be flushed periodically off chemotherapy.  3.  Anemia: Related to chemotherapy and improving at this time.  4.  Dysphagia: Unclear etiology and at this time no evidence of any malignancy.  Endoscopy may be needed in the future if his symptoms recur.   5.  Prognosis: Therapy remains palliative although his performance status is excellent and aggressive therapy is warranted.  6. Followup: Will be in 3 months to follow his progress.  25  minutes was spent with the patient face-to-face today.  More than 50% of time was dedicated to reviewing imaging studies, laboratory data and options of therapy.  Also that time was spent answering questions regarding future plan of care.  [name], MD 12/24/20192:54 PM

## 2018-08-25 NOTE — Telephone Encounter (Signed)
Printed calendar and avs. °

## 2018-10-06 ENCOUNTER — Inpatient Hospital Stay: Payer: 59 | Attending: Oncology

## 2018-10-06 DIAGNOSIS — Z452 Encounter for adjustment and management of vascular access device: Secondary | ICD-10-CM | POA: Diagnosis not present

## 2018-10-06 DIAGNOSIS — C679 Malignant neoplasm of bladder, unspecified: Secondary | ICD-10-CM | POA: Diagnosis not present

## 2018-10-06 DIAGNOSIS — Z95828 Presence of other vascular implants and grafts: Secondary | ICD-10-CM

## 2018-10-06 MED ORDER — HEPARIN SOD (PORK) LOCK FLUSH 100 UNIT/ML IV SOLN
500.0000 [IU] | Freq: Once | INTRAVENOUS | Status: AC
  Administered 2018-10-06: 500 [IU]
  Filled 2018-10-06: qty 5

## 2018-10-06 MED ORDER — SODIUM CHLORIDE 0.9% FLUSH
10.0000 mL | Freq: Once | INTRAVENOUS | Status: AC
  Administered 2018-10-06: 10 mL
  Filled 2018-10-06: qty 10

## 2018-11-27 ENCOUNTER — Other Ambulatory Visit: Payer: Self-pay

## 2018-11-27 ENCOUNTER — Inpatient Hospital Stay: Payer: 59 | Attending: Oncology

## 2018-11-27 ENCOUNTER — Inpatient Hospital Stay (HOSPITAL_BASED_OUTPATIENT_CLINIC_OR_DEPARTMENT_OTHER): Payer: 59 | Admitting: Oncology

## 2018-11-27 ENCOUNTER — Inpatient Hospital Stay: Payer: 59

## 2018-11-27 VITALS — HR 77 | Temp 98.2°F | Resp 16 | Wt 196.6 lb

## 2018-11-27 DIAGNOSIS — C775 Secondary and unspecified malignant neoplasm of intrapelvic lymph nodes: Secondary | ICD-10-CM | POA: Diagnosis not present

## 2018-11-27 DIAGNOSIS — C679 Malignant neoplasm of bladder, unspecified: Secondary | ICD-10-CM | POA: Insufficient documentation

## 2018-11-27 DIAGNOSIS — D649 Anemia, unspecified: Secondary | ICD-10-CM

## 2018-11-27 DIAGNOSIS — Z9221 Personal history of antineoplastic chemotherapy: Secondary | ICD-10-CM | POA: Diagnosis not present

## 2018-11-27 DIAGNOSIS — C7951 Secondary malignant neoplasm of bone: Secondary | ICD-10-CM

## 2018-11-27 LAB — CBC WITH DIFFERENTIAL (CANCER CENTER ONLY)
Abs Immature Granulocytes: 0.02 10*3/uL (ref 0.00–0.07)
Basophils Absolute: 0 10*3/uL (ref 0.0–0.1)
Basophils Relative: 0 %
Eosinophils Absolute: 0.1 10*3/uL (ref 0.0–0.5)
Eosinophils Relative: 1 %
HCT: 39.5 % (ref 39.0–52.0)
Hemoglobin: 13.7 g/dL (ref 13.0–17.0)
Immature Granulocytes: 0 %
Lymphocytes Relative: 32 %
Lymphs Abs: 1.7 10*3/uL (ref 0.7–4.0)
MCH: 29.8 pg (ref 26.0–34.0)
MCHC: 34.7 g/dL (ref 30.0–36.0)
MCV: 86.1 fL (ref 80.0–100.0)
Monocytes Absolute: 0.6 10*3/uL (ref 0.1–1.0)
Monocytes Relative: 12 %
Neutro Abs: 2.9 10*3/uL (ref 1.7–7.7)
Neutrophils Relative %: 55 %
Platelet Count: 174 10*3/uL (ref 150–400)
RBC: 4.59 MIL/uL (ref 4.22–5.81)
RDW: 11.6 % (ref 11.5–15.5)
WBC Count: 5.3 10*3/uL (ref 4.0–10.5)
nRBC: 0 % (ref 0.0–0.2)

## 2018-11-27 LAB — CMP (CANCER CENTER ONLY)
ALT: 78 U/L — ABNORMAL HIGH (ref 0–44)
AST: 44 U/L — ABNORMAL HIGH (ref 15–41)
Albumin: 3.3 g/dL — ABNORMAL LOW (ref 3.5–5.0)
Alkaline Phosphatase: 118 U/L (ref 38–126)
Anion gap: 10 (ref 5–15)
BUN: 19 mg/dL (ref 8–23)
CO2: 26 mmol/L (ref 22–32)
Calcium: 8.7 mg/dL — ABNORMAL LOW (ref 8.9–10.3)
Chloride: 98 mmol/L (ref 98–111)
Creatinine: 1.34 mg/dL — ABNORMAL HIGH (ref 0.61–1.24)
GFR, Est AFR Am: >60 mL/min (ref >60)
GFR, Estimated: 54 mL/min — ABNORMAL LOW (ref >60)
Glucose, Bld: 392 mg/dL — ABNORMAL HIGH (ref 70–99)
Potassium: 4.4 mmol/L (ref 3.5–5.1)
Sodium: 134 mmol/L — ABNORMAL LOW (ref 135–145)
Total Bilirubin: 0.6 mg/dL (ref 0.3–1.2)
Total Protein: 7.2 g/dL (ref 6.5–8.1)
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
## 2018-11-30 ENCOUNTER — Telehealth: Payer: Self-pay | Admitting: Oncology

## 2018-11-30 NOTE — Telephone Encounter (Signed)
Scheduled appointments per 03/27 los Patient does not have vm set up, updated calender will be mailed.

## 2019-01-15 DIAGNOSIS — C679 Malignant neoplasm of bladder, unspecified: Secondary | ICD-10-CM | POA: Diagnosis not present

## 2019-01-15 DIAGNOSIS — R030 Elevated blood-pressure reading, without diagnosis of hypertension: Secondary | ICD-10-CM | POA: Diagnosis not present

## 2019-01-15 DIAGNOSIS — M542 Cervicalgia: Secondary | ICD-10-CM | POA: Diagnosis not present

## 2019-01-19 DIAGNOSIS — M542 Cervicalgia: Secondary | ICD-10-CM | POA: Diagnosis not present

## 2019-01-19 DIAGNOSIS — S134XXA Sprain of ligaments of cervical spine, initial encounter: Secondary | ICD-10-CM | POA: Diagnosis not present

## 2019-02-25 ENCOUNTER — Other Ambulatory Visit: Payer: Self-pay

## 2019-02-25 DIAGNOSIS — C679 Malignant neoplasm of bladder, unspecified: Secondary | ICD-10-CM

## 2019-02-26 ENCOUNTER — Inpatient Hospital Stay: Payer: Managed Care, Other (non HMO) | Attending: Oncology

## 2019-02-26 ENCOUNTER — Ambulatory Visit (HOSPITAL_COMMUNITY): Admission: RE | Admit: 2019-02-26 | Payer: Self-pay | Source: Ambulatory Visit

## 2019-02-26 ENCOUNTER — Inpatient Hospital Stay: Payer: Managed Care, Other (non HMO)

## 2019-03-01 ENCOUNTER — Telehealth: Payer: Self-pay

## 2019-03-01 NOTE — Telephone Encounter (Signed)
Attempted to reach patient about pre-screening questions was unable to leave a voicemail

## 2019-03-02 ENCOUNTER — Telehealth: Payer: Self-pay

## 2019-03-02 ENCOUNTER — Ambulatory Visit: Payer: 59 | Admitting: Oncology

## 2019-03-02 NOTE — Telephone Encounter (Signed)
Called pt regarding today's missed appt. Pt states he was not aware he had appt. Msg sent to schedulers to get him rescheduled.   Contact numbers have been updated per pt request.   Pt encouraged to sign up for MyChart.

## 2019-03-02 NOTE — Telephone Encounter (Signed)
done

## 2019-03-02 NOTE — Telephone Encounter (Signed)
Pt has been scheduled for labs and CT on 7/6. Pt is aware of appt date, time

## 2019-03-02 NOTE — Telephone Encounter (Signed)
He needs CT and labs scheduled before MD as well.

## 2019-03-08 ENCOUNTER — Inpatient Hospital Stay: Payer: Managed Care, Other (non HMO) | Attending: Oncology

## 2019-03-08 ENCOUNTER — Other Ambulatory Visit: Payer: Self-pay

## 2019-03-08 ENCOUNTER — Ambulatory Visit (HOSPITAL_COMMUNITY)
Admission: RE | Admit: 2019-03-08 | Discharge: 2019-03-08 | Disposition: A | Discharge status: Home / Self Care | Payer: Managed Care, Other (non HMO) | Source: Ambulatory Visit | Attending: Oncology | Admitting: Oncology

## 2019-03-08 ENCOUNTER — Inpatient Hospital Stay: Payer: Managed Care, Other (non HMO)

## 2019-03-08 DIAGNOSIS — Z95828 Presence of other vascular implants and grafts: Secondary | ICD-10-CM

## 2019-03-08 DIAGNOSIS — C679 Malignant neoplasm of bladder, unspecified: Secondary | ICD-10-CM

## 2019-03-08 DIAGNOSIS — Z79899 Other long term (current) drug therapy: Secondary | ICD-10-CM | POA: Insufficient documentation

## 2019-03-08 DIAGNOSIS — C670 Malignant neoplasm of trigone of bladder: Secondary | ICD-10-CM | POA: Insufficient documentation

## 2019-03-08 DIAGNOSIS — M25561 Pain in right knee: Secondary | ICD-10-CM | POA: Insufficient documentation

## 2019-03-08 DIAGNOSIS — G8929 Other chronic pain: Secondary | ICD-10-CM | POA: Insufficient documentation

## 2019-03-08 DIAGNOSIS — Z5112 Encounter for antineoplastic immunotherapy: Secondary | ICD-10-CM | POA: Insufficient documentation

## 2019-03-08 DIAGNOSIS — R590 Localized enlarged lymph nodes: Secondary | ICD-10-CM | POA: Insufficient documentation

## 2019-03-08 LAB — CBC WITH DIFFERENTIAL (CANCER CENTER ONLY)
Abs Immature Granulocytes: 0.01 10*3/uL (ref 0.00–0.07)
Basophils Absolute: 0 10*3/uL (ref 0.0–0.1)
Basophils Relative: 0 %
Eosinophils Absolute: 0.1 10*3/uL (ref 0.0–0.5)
Eosinophils Relative: 1 %
HCT: 39.3 % (ref 39.0–52.0)
Hemoglobin: 14 g/dL (ref 13.0–17.0)
Immature Granulocytes: 0 %
Lymphocytes Relative: 29 %
Lymphs Abs: 1.7 10*3/uL (ref 0.7–4.0)
MCH: 30.4 pg (ref 26.0–34.0)
MCHC: 35.6 g/dL (ref 30.0–36.0)
MCV: 85.4 fL (ref 80.0–100.0)
Monocytes Absolute: 0.6 10*3/uL (ref 0.1–1.0)
Monocytes Relative: 11 %
Neutro Abs: 3.4 10*3/uL (ref 1.7–7.7)
Neutrophils Relative %: 59 %
Platelet Count: 184 10*3/uL (ref 150–400)
RBC: 4.6 MIL/uL (ref 4.22–5.81)
RDW: 12.2 % (ref 11.5–15.5)
WBC Count: 5.8 10*3/uL (ref 4.0–10.5)
nRBC: 0 % (ref 0.0–0.2)

## 2019-03-08 LAB — CMP (CANCER CENTER ONLY)
ALT: 53 U/L — ABNORMAL HIGH (ref 0–44)
AST: 44 U/L — ABNORMAL HIGH (ref 15–41)
Albumin: 3.7 g/dL (ref 3.5–5.0)
Alkaline Phosphatase: 86 U/L (ref 38–126)
Anion gap: 9 (ref 5–15)
BUN: 30 mg/dL — ABNORMAL HIGH (ref 8–23)
CO2: 27 mmol/L (ref 22–32)
Calcium: 9 mg/dL (ref 8.9–10.3)
Chloride: 99 mmol/L (ref 98–111)
Creatinine: 1.29 mg/dL — ABNORMAL HIGH (ref 0.61–1.24)
GFR, Est AFR Am: >60 mL/min (ref >60)
GFR, Estimated: 57 mL/min — ABNORMAL LOW (ref >60)
Glucose, Bld: 330 mg/dL — ABNORMAL HIGH (ref 70–99)
Potassium: 4.5 mmol/L (ref 3.5–5.1)
Sodium: 135 mmol/L (ref 135–145)
Total Bilirubin: 0.4 mg/dL (ref 0.3–1.2)
Total Protein: 7.4 g/dL (ref 6.5–8.1)

## 2019-03-08 MED ORDER — SODIUM CHLORIDE 0.9% FLUSH
10.0000 mL | Freq: Once | INTRAVENOUS | Status: AC
  Administered 2019-03-08: 10 mL
  Filled 2019-03-08: qty 10

## 2019-03-08 MED ORDER — SODIUM CHLORIDE (PF) 0.9 % IJ SOLN
INTRAMUSCULAR | Status: AC
  Filled 2019-03-08: qty 50

## 2019-03-08 MED ORDER — IOHEXOL 300 MG/ML  SOLN
100.0000 mL | Freq: Once | INTRAMUSCULAR | Status: AC | PRN
  Administered 2019-03-08: 16:00:00 100 mL via INTRAVENOUS

## 2019-03-08 MED ORDER — SODIUM CHLORIDE (PF) 0.9 % IJ SOLN
INTRAMUSCULAR | Status: AC
  Filled 2019-03-08: qty 150

## 2019-03-08 MED ORDER — HEPARIN SOD (PORK) LOCK FLUSH 100 UNIT/ML IV SOLN
INTRAVENOUS | Status: AC
  Filled 2019-03-08: qty 5

## 2019-03-22 ENCOUNTER — Telehealth: Payer: Self-pay | Admitting: Oncology

## 2019-03-22 ENCOUNTER — Other Ambulatory Visit: Payer: Self-pay

## 2019-03-22 ENCOUNTER — Inpatient Hospital Stay (HOSPITAL_BASED_OUTPATIENT_CLINIC_OR_DEPARTMENT_OTHER): Payer: Managed Care, Other (non HMO) | Admitting: Oncology

## 2019-03-22 VITALS — BP 141/73 | HR 73 | Temp 97.8°F | Resp 16 | Wt 198.1 lb

## 2019-03-22 DIAGNOSIS — G8929 Other chronic pain: Secondary | ICD-10-CM

## 2019-03-22 DIAGNOSIS — Z79899 Other long term (current) drug therapy: Secondary | ICD-10-CM

## 2019-03-22 DIAGNOSIS — M25551 Pain in right hip: Secondary | ICD-10-CM | POA: Diagnosis not present

## 2019-03-22 DIAGNOSIS — C670 Malignant neoplasm of trigone of bladder: Secondary | ICD-10-CM | POA: Diagnosis present

## 2019-03-22 DIAGNOSIS — R590 Localized enlarged lymph nodes: Secondary | ICD-10-CM | POA: Diagnosis not present

## 2019-03-22 DIAGNOSIS — C679 Malignant neoplasm of bladder, unspecified: Secondary | ICD-10-CM

## 2019-03-22 DIAGNOSIS — Z95828 Presence of other vascular implants and grafts: Secondary | ICD-10-CM

## 2019-03-22 DIAGNOSIS — M25561 Pain in right knee: Secondary | ICD-10-CM | POA: Diagnosis not present

## 2019-03-22 DIAGNOSIS — Z5112 Encounter for antineoplastic immunotherapy: Secondary | ICD-10-CM | POA: Diagnosis present

## 2019-03-22 MED ORDER — OXYCODONE-ACETAMINOPHEN 5-325 MG PO TABS: 1.0000 | ORAL_TABLET | Freq: Four times a day (QID) | ORAL | 0 refills | Status: DC | PRN

## 2019-03-22 MED ORDER — LIDOCAINE-PRILOCAINE 2.5-2.5 % EX CREA: 1.0000 "application " | TOPICAL_CREAM | CUTANEOUS | 0 refills | Status: DC | PRN

## 2019-03-22 NOTE — Progress Notes (Signed)
Hematology and Oncology Follow Up Visit  Danny Wood [id number] 1951/03/16 68 y.o. 03/22/2019 11:15 AM Danny Wood, Danny Brow, MD   Principle Diagnosis: 68 year old man with stage IV bladder cancer with pelvic adenopathy cancer documented in 2019 after initial diagnosis in 2015.     Prior Therapy:  He underwent a cystoscopy and a TURBT on 05/17/2014.   Neoadjuvant systemic chemotherapy in the form of gemcitabine and cisplatin cycle 1 day 1 is on 06/17/2014. He is S/P two cycles completed in 07/2014.  Therapy tolerated poorly with symptoms of nausea and vomiting and worsening renal function.  He is status post robotic cystoprostatectomy and bilateral lymphadenectomy completed on September 09, 2014.  The final pathology revealed T2N0 without any lymph node involvement.  He developed pelvic adenopathy that was biopsy-proven on Jan 14, 2018 to be metastatic urothelial carcinoma.  Carboplatin and gemcitabine cycle 1 started on 02/17/2018.  He completed 8 cycles of therapy in December 2019.   Current therapy: And consideration for salvage therapy.  Interim History: Danny Wood presents today for a repeat evaluation.  Since the last visit, he reports increase in his right knee pain associated with erythema and swelling.  He has been using Percocet with improvement in his pain.  He denies any trauma, falls or any specific twisting of his knee.  He denies any recent hospitalization or illnesses.  He is noticing also a left groin lymph node.  His appetite and respiratory status remains the same.  He denies any constitutional symptoms or weakness.  He denies any recent falls.  He denied headaches, blurry vision, syncope or seizures.  Denies any fevers, chills or sweats.  Denied chest pain, palpitation, orthopnea or leg edema.  Denied cough, wheezing or hemoptysis.  Denied nausea, vomiting or abdominal pain.  Denies any constipation or diarrhea.  Denies any frequency urgency or  hesitancy.  Denies any arthralgias or myalgias.  Denies any skin rashes or lesions.  Denies any bleeding or clotting tendency.  Denies any easy bruising.  Denies any hair or nail changes.  Denies any anxiety or depression.  Remaining review of system is negative.         Medications: No changes in medication noted. Current Outpatient Medications  Medication Sig Dispense Refill  . lidocaine-prilocaine (EMLA) cream Apply 1 application topically as needed. 30 g 0  . ondansetron (ZOFRAN) 8 MG tablet Take 1 tablet (8 mg total) by mouth every 8 (eight) hours as needed for nausea or vomiting. 20 tablet 0  . oxyCODONE-acetaminophen (PERCOCET) 5-325 MG tablet Take 1-2 tablets by mouth every 6 (six) hours as needed for moderate pain or severe pain. 90 tablet 0   No current facility-administered medications for this visit.      Allergies: No Known Allergies  Past Medical History, Surgical history, Social history, and Family History remains without change on update.   Physical Exam:    Blood pressure (!) 141/73, pulse 73, temperature 97.8 F (36.6 C), temperature source Tympanic, resp. rate 16, weight 198 lb 1 oz (89.8 kg), SpO2 99 %.       ECOG: 0    General appearance: Comfortable appearing without any discomfort Head: Normocephalic without any trauma Oropharynx: Mucous membranes are moist and pink without any thrush or ulcers. Eyes: Pupils are equal and round reactive to light. Lymph nodes:  Palpable left inguinal lymph node noted. Heart:regular rate and rhythm.  S1 and S2 without leg edema. Lung: Clear without any rhonchi or wheezes.  No dullness to percussion. Abdomin:  Soft, nontender, nondistended with good bowel sounds.  No hepatosplenomegaly. Musculoskeletal: Mild erythema and effusion noted in his right knee.  Tender to touch limited range of motion. Neurological: No deficits noted on motor, sensory and deep tendon reflex exam. Skin: No petechial rash or dryness.   Appeared moist.  Psychiatric: Mood and affect appeared appropriate.   .          Lab Results: Lab Results  Component Value Date   WBC 5.8 03/08/2019   HGB 14.0 03/08/2019   HCT 39.3 03/08/2019   MCV 85.4 03/08/2019   PLT 184 03/08/2019     Chemistry      Component Value Date/Time   NA 135 03/08/2019 1500   NA 139 08/05/2014 1335   K 4.5 03/08/2019 1500   K 4.3 08/05/2014 1335   CL 99 03/08/2019 1500   CO2 27 03/08/2019 1500   CO2 26 08/05/2014 1335   BUN 30 (H) 03/08/2019 1500   BUN 18.2 08/05/2014 1335   CREATININE 1.29 (H) 03/08/2019 1500   CREATININE 1.1 08/05/2014 1335      Component Value Date/Time   CALCIUM 9.0 03/08/2019 1500   CALCIUM 9.5 08/05/2014 1335   ALKPHOS 86 03/08/2019 1500   ALKPHOS 98 08/05/2014 1335   AST 44 (H) 03/08/2019 1500   AST 45 (H) 08/05/2014 1335   ALT 53 (H) 03/08/2019 1500   ALT 73 (H) 08/05/2014 1335   BILITOT 0.4 03/08/2019 1500   BILITOT 0.47 08/05/2014 1335     EXAM: CT CHEST, ABDOMEN, AND PELVIS WITH CONTRAST  TECHNIQUE: Multidetector CT imaging of the chest, abdomen and pelvis was performed following the standard protocol during bolus administration of intravenous contrast.  CONTRAST:  176mL OMNIPAQUE IOHEXOL 300 MG/ML  SOLN  COMPARISON:  08/22/2018  FINDINGS: CT CHEST FINDINGS  Cardiovascular: The heart is normal in size. No pericardial effusion. The aorta is normal in caliber. Stable atherosclerotic calcifications. The branch vessels are patent. Stable age advanced coronary artery calcifications. Stable calcifications at the aortic arch. The Port-A-Cath is stable. Pulmonary arteries appear normal.  Mediastinum/Nodes: No mediastinal or hilar mass or lymphadenopathy. The esophagus is grossly normal.  Lungs/Pleura: New ground-glass opacities in the right upper lobe. The more anterior lesion on image number 27 measures 16 mm and the more posterior lesion on image number 33 measures 15.5 mm. Is also  a small more solid appearing nodule in the right upper lobe on image number 28 which measures 5 mm. These are all new and may be inflammatory. Tiny nodule at the left lung apex is stable. No other significant pulmonary findings. No pleural effusions.  Musculoskeletal: No chest wall mass, supraclavicular or axillary adenopathy. The bony thorax is intact. No worrisome bone lesions.  CT ABDOMEN PELVIS FINDINGS  Hepatobiliary: No focal hepatic lesions or intrahepatic biliary dilatation. The gallbladder is normal. No common bile duct dilatation.  Pancreas: No mass, inflammation or ductal dilatation.  Spleen: Normal size.  No focal lesions.  Adrenals/Urinary Tract: The adrenal glands are unremarkable and stable.  Stable cortical scarring changes involving the right kidney anteriorly. The left kidney demonstrates a stable medial cyst. No worrisome renal lesions or hydronephrosis. The ureters are unremarkable.  The patient has had a cysto rostatectomy and urinary diversion. No complicating features are demonstrated.  Stomach/Bowel: The stomach, duodenum, small bowel and colon are grossly normal. The terminal ileum and appendix are normal. Stable surgical changes from a ileal conduit.  Vascular/Lymphatic: Progressive retroperitoneal, pelvic and inguinal lymphadenopathy.  Retroperitoneal lymph node near  the left renal vein on image number 77 measures 11.5 mm and previously measured 7 mm.  Left-sided retroperitoneal nodal mass on image number 81 measures a total of 24 x 14 mm and previously measured 18.5 x 11 mm.  8.5 mm left retroperitoneal lymph node on image number 73 previously measured 4 mm.  Left external iliac lymph node on image number 120 measures 10.5 mm and previously measured 10 mm.  The largest left inguinal lymph node on image number 134 measures 19.5 mm and previously measured 12.5 mm.  Largest right inguinal node on image number 127 measures  15.5 mm and previously measured 9 mm.  Soft tissue thickening along the right side of the sigmoid colon on image number 120 is new or progressive measures 12 mm.  Reproductive: Status post cysto prostatectomy.  Other: No free pelvic fluid collections or pelvic abscess.  Musculoskeletal: No significant bony findings.  IMPRESSION: 1. Three new lesions in the right apex, likely inflammatory change. Attention on future scans is suggested. Stable small left apical nodule. 2. No mediastinal or hilar mass or adenopathy. 3. Progressive retroperitoneal, pelvic and inguinal lymphadenopathy as detailed above. There also appears to be right-sided pelvic tumor adjacent to the sigmoid colon which is new or progressive. 4. Stable surgical changes from cysto prostatectomy and urinary diversion.   Impression and Plan:  68 year old man with:    1.  Stage IV bladder cancer documented in 2019.   He is status post therapy outlined above currently on treatment break.  CT scan obtained on 03/08/2019 was personally reviewed and discussed with the patient.  He has disease progression noted by pelvic adenopathy and mildly symptomatic with the left inguinal lymph node enlargement.  The natural course of this disease was reviewed at this time.  Treatment options were also outlined.  He would be a reasonable candidate for salvage therapy which is required at this time.  Pembrolizumab is a reasonable option to treat with as a second line therapy.  Complication associated with this therapy include nausea, fatigue, immune mediated complications.  These include pneumonitis, colitis and thyroid disease.  After discussion is agreeable to proceed.  He will receive it once every 3 weeks at 200 mg last dose.  CT scan will be repeated in 2 to 3 months.  2.  IV access: Port-A-Cath is in use and will be accessed periodically.  EMLA cream will be made available to him.  3.  Right knee pain: Could be related to  minor trauma but given the effusion and his risk of cancer, will obtain MRI of the knee to rule out bone metastasis.  4.  Chronic pain: Prescription for Percocet was made available to him.   5.  Prognosis: His disease remains incurable although aggressive measures are warranted given his excellent performance status.  6. Followup: We will be in the immediate future to restart salvage therapy.  25  minutes was spent with the patient face-to-face today.  More than 50% of time was spent on reviewing his disease status, reviewing laboratory data, imaging studies and outlining future plan of care.  [name], MD 7/20/202011:15 AM

## 2019-03-22 NOTE — Progress Notes (Signed)
DISCONTINUE ON PATHWAY REGIMEN - Bladder     A cycle is every 21 days:     Carboplatin      Gemcitabine   **Always confirm dose/schedule in your pharmacy ordering system**  REASON: Disease Progression PRIOR TREATMENT: BLAOS64: Carboplatin AUC=5 D1 + Gemcitabine 1,000 mg/m2 D1, 8 q21 Days for a Maximum of 6 Cycles TREATMENT RESPONSE: Partial Response (PR)  START ON PATHWAY REGIMEN - Bladder     A cycle is 21 days:     Pembrolizumab   **Always confirm dose/schedule in your pharmacy ordering system**  Patient Characteristics: Metastatic Disease, Second Line, FGFR2/FGFR3 Mutation Negative or Unknown, Prior Platinum-Based Therapy and No Prior PD-1/PD-L1 Inhibitor Therapeutic Status: Metastatic Disease Line of Therapy: Second Line FGFR2/FGFR3 Mutation Status: Did Not Order Test Intent of Therapy: Non-Curative / Palliative Intent, Discussed with Patient

## 2019-03-22 NOTE — Telephone Encounter (Signed)
Gave avs and calendar ° °

## 2019-03-26 ENCOUNTER — Inpatient Hospital Stay: Payer: Managed Care, Other (non HMO)

## 2019-03-31 ENCOUNTER — Other Ambulatory Visit: Payer: Self-pay

## 2019-03-31 DIAGNOSIS — M25551 Pain in right hip: Secondary | ICD-10-CM

## 2019-04-01 ENCOUNTER — Inpatient Hospital Stay: Payer: Managed Care, Other (non HMO)

## 2019-04-01 ENCOUNTER — Other Ambulatory Visit: Payer: Self-pay

## 2019-04-01 VITALS — BP 129/68 | HR 65 | Temp 97.3°F | Resp 18

## 2019-04-01 DIAGNOSIS — C679 Malignant neoplasm of bladder, unspecified: Secondary | ICD-10-CM

## 2019-04-01 DIAGNOSIS — Z5112 Encounter for antineoplastic immunotherapy: Secondary | ICD-10-CM | POA: Diagnosis not present

## 2019-04-01 DIAGNOSIS — Z95828 Presence of other vascular implants and grafts: Secondary | ICD-10-CM

## 2019-04-01 LAB — CMP (CANCER CENTER ONLY)
ALT: 41 U/L (ref 0–44)
AST: 31 U/L (ref 15–41)
Albumin: 3.2 g/dL — ABNORMAL LOW (ref 3.5–5.0)
Alkaline Phosphatase: 105 U/L (ref 38–126)
Anion gap: 6 (ref 5–15)
BUN: 18 mg/dL (ref 8–23)
CO2: 27 mmol/L (ref 22–32)
Calcium: 8.7 mg/dL — ABNORMAL LOW (ref 8.9–10.3)
Chloride: 100 mmol/L (ref 98–111)
Creatinine: 1.19 mg/dL (ref 0.61–1.24)
GFR, Est AFR Am: >60 mL/min (ref >60)
GFR, Estimated: >60 mL/min (ref >60)
Glucose, Bld: 323 mg/dL — ABNORMAL HIGH (ref 70–99)
Potassium: 4.4 mmol/L (ref 3.5–5.1)
Sodium: 133 mmol/L — ABNORMAL LOW (ref 135–145)
Total Bilirubin: 0.4 mg/dL (ref 0.3–1.2)
Total Protein: 6.9 g/dL (ref 6.5–8.1)

## 2019-04-01 LAB — CBC WITH DIFFERENTIAL (CANCER CENTER ONLY)
Abs Immature Granulocytes: 0.01 10*3/uL (ref 0.00–0.07)
Basophils Absolute: 0 10*3/uL (ref 0.0–0.1)
Basophils Relative: 0 %
Eosinophils Absolute: 0.1 10*3/uL (ref 0.0–0.5)
Eosinophils Relative: 1 %
HCT: 38.4 % — ABNORMAL LOW (ref 39.0–52.0)
Hemoglobin: 13.5 g/dL (ref 13.0–17.0)
Immature Granulocytes: 0 %
Lymphocytes Relative: 29 %
Lymphs Abs: 1.6 10*3/uL (ref 0.7–4.0)
MCH: 30 pg (ref 26.0–34.0)
MCHC: 35.2 g/dL (ref 30.0–36.0)
MCV: 85.3 fL (ref 80.0–100.0)
Monocytes Absolute: 0.6 10*3/uL (ref 0.1–1.0)
Monocytes Relative: 11 %
Neutro Abs: 3.2 10*3/uL (ref 1.7–7.7)
Neutrophils Relative %: 59 %
Platelet Count: 174 10*3/uL (ref 150–400)
RBC: 4.5 MIL/uL (ref 4.22–5.81)
RDW: 12.1 % (ref 11.5–15.5)
WBC Count: 5.4 10*3/uL (ref 4.0–10.5)
nRBC: 0 % (ref 0.0–0.2)

## 2019-04-01 LAB — TSH: TSH: 1.401 u[IU]/mL (ref 0.320–4.118)

## 2019-04-01 MED ORDER — SODIUM CHLORIDE 0.9% FLUSH
10.0000 mL | INTRAVENOUS | Status: DC | PRN
  Administered 2019-04-01: 10 mL
  Filled 2019-04-01: qty 10

## 2019-04-01 MED ORDER — HEPARIN SOD (PORK) LOCK FLUSH 100 UNIT/ML IV SOLN
500.0000 [IU] | Freq: Once | INTRAVENOUS | Status: AC | PRN
  Administered 2019-04-01: 16:00:00 500 [IU]
  Filled 2019-04-01: qty 5

## 2019-04-01 MED ORDER — SODIUM CHLORIDE 0.9 % IV SOLN
200.0000 mg | Freq: Once | INTRAVENOUS | Status: AC
  Administered 2019-04-01: 200 mg via INTRAVENOUS
  Filled 2019-04-01: qty 8

## 2019-04-01 MED ORDER — SODIUM CHLORIDE 0.9% FLUSH
10.0000 mL | Freq: Once | INTRAVENOUS | Status: AC
  Administered 2019-04-01: 10 mL
  Filled 2019-04-01: qty 10

## 2019-04-01 MED ORDER — SODIUM CHLORIDE 0.9 % IV SOLN
Freq: Once | INTRAVENOUS | Status: AC
  Administered 2019-04-01: 15:00:00 via INTRAVENOUS
  Filled 2019-04-01: qty 250

## 2019-04-01 NOTE — Patient Instructions (Addendum)
[facility] Discharge Instructions for Patients Receiving Chemotherapy  Today you received the following chemotherapy agents: Keytruda.  To help prevent nausea and vomiting after your treatment, we encourage you to take your nausea medication as directed.   If you develop nausea and vomiting that is not controlled by your nausea medication, call the clinic.   BELOW ARE SYMPTOMS THAT SHOULD BE REPORTED IMMEDIATELY:  *FEVER GREATER THAN 100.5 F  *CHILLS WITH OR WITHOUT FEVER  NAUSEA AND VOMITING THAT IS NOT CONTROLLED WITH YOUR NAUSEA MEDICATION  *UNUSUAL SHORTNESS OF BREATH  *UNUSUAL BRUISING OR BLEEDING  TENDERNESS IN MOUTH AND THROAT WITH OR WITHOUT PRESENCE OF ULCERS  *URINARY PROBLEMS  *BOWEL PROBLEMS  UNUSUAL RASH Items with * indicate a potential emergency and should be followed up as soon as possible.  Feel free to call the clinic should you have any questions or concerns. The clinic phone number is [phone].  Please show the CHEMO ALERT CARD at check-in to the Emergency Department and triage nurse.  Pembrolizumab injection What is this medicine? PEMBROLIZUMAB (pem broe liz ue mab) is a monoclonal antibody. It is used to treat bladder cancer, cervical cancer, endometrial cancer, esophageal cancer, head and neck cancer, hepatocellular cancer, Hodgkin lymphoma, kidney cancer, lymphoma, melanoma, Merkel cell carcinoma, lung cancer, stomach cancer, urothelial cancer, and cancers that have a certain genetic condition. This medicine may be used for other purposes; ask your health care provider or pharmacist if you have questions. COMMON BRAND NAME(S): Keytruda What should I tell my health care provider before I take this medicine? They need to know if you have any of these conditions:  diabetes  immune system problems  inflammatory bowel disease  liver disease  lung or breathing disease  lupus  received or scheduled to receive an organ  transplant or a stem-cell transplant that uses donor stem cells  an unusual or allergic reaction to pembrolizumab, other medicines, foods, dyes, or preservatives  pregnant or trying to get pregnant  breast-feeding How should I use this medicine? This medicine is for infusion into a vein. It is given by a health care professional in a hospital or clinic setting. A special MedGuide will be given to you before each treatment. Be sure to read this information carefully each time. Talk to your pediatrician regarding the use of this medicine in children. While this drug may be prescribed for selected conditions, precautions do apply. Overdosage: If you think you have taken too much of this medicine contact a poison control center or emergency room at once. NOTE: This medicine is only for you. Do not share this medicine with others. What if I miss a dose? It is important not to miss your dose. Call your doctor or health care professional if you are unable to keep an appointment. What may interact with this medicine? Interactions have not been studied. Give your health care provider a list of all the medicines, herbs, non-prescription drugs, or dietary supplements you use. Also tell them if you smoke, drink alcohol, or use illegal drugs. Some items may interact with your medicine. This list may not describe all possible interactions. Give your health care provider a list of all the medicines, herbs, non-prescription drugs, or dietary supplements you use. Also tell them if you smoke, drink alcohol, or use illegal drugs. Some items may interact with your medicine. What should I watch for while using this medicine? Your condition will be monitored carefully while you are receiving this medicine. You may need   blood work done while you are taking this medicine. Do not become pregnant while taking this medicine or for 4 months after stopping it. Women should inform their doctor if they wish to become  pregnant or think they might be pregnant. There is a potential for serious side effects to an unborn child. Talk to your health care professional or pharmacist for more information. Do not breast-feed an infant while taking this medicine or for 4 months after the last dose. What side effects may I notice from receiving this medicine? Side effects that you should report to your doctor or health care professional as soon as possible:  allergic reactions like skin rash, itching or hives, swelling of the face, lips, or tongue  bloody or black, tarry  breathing problems  changes in vision  chest pain  chills  confusion  constipation  cough  diarrhea  dizziness or feeling faint or lightheaded  fast or irregular heartbeat  fever  flushing  hair loss  joint pain  low blood counts - this medicine may decrease the number of white blood cells, red blood cells and platelets. You may be at increased risk for infections and bleeding.  muscle pain  muscle weakness  persistent headache  redness, blistering, peeling or loosening of the skin, including inside the mouth  signs and symptoms of high blood sugar such as dizziness; dry mouth; dry skin; fruity breath; nausea; stomach pain; increased hunger or thirst; increased urination  signs and symptoms of kidney injury like trouble passing urine or change in the amount of urine  signs and symptoms of liver injury like dark urine, light-colored stools, loss of appetite, nausea, right upper belly pain, yellowing of the eyes or skin  sweating  swollen lymph nodes  weight loss Side effects that usually do not require medical attention (report to your doctor or health care professional if they continue or are bothersome):  decreased appetite  muscle pain  tiredness This list may not describe all possible side effects. Call your doctor for medical advice about side effects. You may report side effects to FDA at  1-800-FDA-1088. Where should I keep my medicine? This drug is given in a hospital or clinic and will not be stored at home. NOTE: This sheet is a summary. It may not cover all possible information. If you have questions about this medicine, talk to your doctor, pharmacist, or health care provider.  2020 Elsevier/Gold Standard (2018-09-15 13:46:58)  

## 2019-04-02 ENCOUNTER — Ambulatory Visit (HOSPITAL_COMMUNITY): Admission: RE | Admit: 2019-04-02 | Payer: Managed Care, Other (non HMO) | Source: Ambulatory Visit

## 2019-04-02 ENCOUNTER — Ambulatory Visit (HOSPITAL_COMMUNITY): Payer: Managed Care, Other (non HMO)

## 2019-04-02 ENCOUNTER — Encounter (HOSPITAL_COMMUNITY): Payer: Self-pay

## 2019-04-05 ENCOUNTER — Telehealth: Payer: Self-pay

## 2019-04-05 NOTE — Telephone Encounter (Signed)
Contacted patient and he stated that he has been doing fine since 1st time [place]. No questions or concerns at this time and patient is aware to call back if any arise.

## 2019-04-23 ENCOUNTER — Inpatient Hospital Stay: Payer: Managed Care, Other (non HMO)

## 2019-04-23 ENCOUNTER — Inpatient Hospital Stay: Payer: Managed Care, Other (non HMO) | Attending: Oncology | Admitting: Oncology

## 2019-04-23 ENCOUNTER — Other Ambulatory Visit: Payer: Self-pay

## 2019-04-23 VITALS — BP 146/70 | HR 63 | Temp 97.8°F | Resp 18 | Wt 192.2 lb

## 2019-04-23 DIAGNOSIS — M79604 Pain in right leg: Secondary | ICD-10-CM | POA: Diagnosis not present

## 2019-04-23 DIAGNOSIS — G893 Neoplasm related pain (acute) (chronic): Secondary | ICD-10-CM | POA: Insufficient documentation

## 2019-04-23 DIAGNOSIS — C679 Malignant neoplasm of bladder, unspecified: Secondary | ICD-10-CM

## 2019-04-23 DIAGNOSIS — Z95828 Presence of other vascular implants and grafts: Secondary | ICD-10-CM

## 2019-04-23 DIAGNOSIS — R102 Pelvic and perineal pain: Secondary | ICD-10-CM | POA: Diagnosis not present

## 2019-04-23 DIAGNOSIS — Z79899 Other long term (current) drug therapy: Secondary | ICD-10-CM | POA: Insufficient documentation

## 2019-04-23 DIAGNOSIS — J189 Pneumonia, unspecified organism: Secondary | ICD-10-CM | POA: Diagnosis not present

## 2019-04-23 DIAGNOSIS — R590 Localized enlarged lymph nodes: Secondary | ICD-10-CM | POA: Diagnosis not present

## 2019-04-23 DIAGNOSIS — E079 Disorder of thyroid, unspecified: Secondary | ICD-10-CM | POA: Diagnosis not present

## 2019-04-23 DIAGNOSIS — R1031 Right lower quadrant pain: Secondary | ICD-10-CM | POA: Insufficient documentation

## 2019-04-23 DIAGNOSIS — Z5112 Encounter for antineoplastic immunotherapy: Secondary | ICD-10-CM | POA: Insufficient documentation

## 2019-04-23 DIAGNOSIS — M1990 Unspecified osteoarthritis, unspecified site: Secondary | ICD-10-CM | POA: Insufficient documentation

## 2019-04-23 DIAGNOSIS — K529 Noninfective gastroenteritis and colitis, unspecified: Secondary | ICD-10-CM | POA: Diagnosis not present

## 2019-04-23 LAB — CMP (CANCER CENTER ONLY)
ALT: 46 U/L — ABNORMAL HIGH (ref 0–44)
AST: 35 U/L (ref 15–41)
Albumin: 3.5 g/dL (ref 3.5–5.0)
Alkaline Phosphatase: 91 U/L (ref 38–126)
Anion gap: 8 (ref 5–15)
BUN: 18 mg/dL (ref 8–23)
CO2: 25 mmol/L (ref 22–32)
Calcium: 8.9 mg/dL (ref 8.9–10.3)
Chloride: 102 mmol/L (ref 98–111)
Creatinine: 1.09 mg/dL (ref 0.61–1.24)
GFR, Est AFR Am: >60 mL/min (ref >60)
GFR, Estimated: >60 mL/min (ref >60)
Glucose, Bld: 214 mg/dL — ABNORMAL HIGH (ref 70–99)
Potassium: 4.3 mmol/L (ref 3.5–5.1)
Sodium: 135 mmol/L (ref 135–145)
Total Bilirubin: 0.6 mg/dL (ref 0.3–1.2)
Total Protein: 7.3 g/dL (ref 6.5–8.1)

## 2019-04-23 LAB — CBC WITH DIFFERENTIAL (CANCER CENTER ONLY)
Abs Immature Granulocytes: 0.01 10*3/uL (ref 0.00–0.07)
Basophils Absolute: 0 10*3/uL (ref 0.0–0.1)
Basophils Relative: 0 %
Eosinophils Absolute: 0.1 10*3/uL (ref 0.0–0.5)
Eosinophils Relative: 1 %
HCT: 37.9 % — ABNORMAL LOW (ref 39.0–52.0)
Hemoglobin: 13.4 g/dL (ref 13.0–17.0)
Immature Granulocytes: 0 %
Lymphocytes Relative: 27 %
Lymphs Abs: 1.6 10*3/uL (ref 0.7–4.0)
MCH: 29.9 pg (ref 26.0–34.0)
MCHC: 35.4 g/dL (ref 30.0–36.0)
MCV: 84.6 fL (ref 80.0–100.0)
Monocytes Absolute: 0.6 10*3/uL (ref 0.1–1.0)
Monocytes Relative: 10 %
Neutro Abs: 3.7 10*3/uL (ref 1.7–7.7)
Neutrophils Relative %: 62 %
Platelet Count: 163 10*3/uL (ref 150–400)
RBC: 4.48 MIL/uL (ref 4.22–5.81)
RDW: 12.2 % (ref 11.5–15.5)
WBC Count: 6 10*3/uL (ref 4.0–10.5)
nRBC: 0 % (ref 0.0–0.2)

## 2019-04-23 MED ORDER — SODIUM CHLORIDE 0.9 % IV SOLN
Freq: Once | INTRAVENOUS | Status: AC
  Administered 2019-04-23: 13:00:00 via INTRAVENOUS
  Filled 2019-04-23: qty 250

## 2019-04-23 MED ORDER — HEPARIN SOD (PORK) LOCK FLUSH 100 UNIT/ML IV SOLN
500.0000 [IU] | Freq: Once | INTRAVENOUS | Status: AC | PRN
  Administered 2019-04-23: 14:00:00 500 [IU]
  Filled 2019-04-23: qty 5

## 2019-04-23 MED ORDER — SODIUM CHLORIDE 0.9 % IV SOLN
200.0000 mg | Freq: Once | INTRAVENOUS | Status: AC
  Administered 2019-04-23: 14:00:00 200 mg via INTRAVENOUS
  Filled 2019-04-23: qty 8

## 2019-04-23 MED ORDER — SODIUM CHLORIDE 0.9% FLUSH
10.0000 mL | Freq: Once | INTRAVENOUS | Status: AC
  Administered 2019-04-23: 10 mL
  Filled 2019-04-23: qty 10

## 2019-04-23 MED ORDER — SODIUM CHLORIDE 0.9% FLUSH
10.0000 mL | INTRAVENOUS | Status: DC | PRN
  Administered 2019-04-23: 10 mL
  Filled 2019-04-23: qty 10

## 2019-04-23 NOTE — Patient Instructions (Addendum)

## 2019-04-23 NOTE — Progress Notes (Signed)
Hematology and Oncology Follow Up Visit  Danny Wood Route [id number] 1951/08/09 68 y.o. 04/23/2019 12:27 PM [name], [name], MD   Principle Diagnosis: 68 year old man with bladder cancer diagnosed in 2015.  He developed stage IV disease with adenopathy in 2019.  Prior Therapy:  He underwent a cystoscopy and a TURBT on 05/17/2014.   Neoadjuvant systemic chemotherapy in the form of gemcitabine and cisplatin cycle 1 day 1 is on 06/17/2014. He is S/P two cycles completed in 07/2014.  Therapy tolerated poorly with symptoms of nausea and vomiting and worsening renal function.  He is status post robotic cystoprostatectomy and bilateral lymphadenectomy completed on September 09, 2014.  The final pathology revealed T2N0 without any lymph node involvement.  He developed pelvic adenopathy that was biopsy-proven on Jan 14, 2018 to be metastatic urothelial carcinoma.  Carboplatin and gemcitabine cycle 1 started on 02/17/2018.  He completed 8 cycles of therapy in December 2019.   Current therapy: Pembrolizumab 200 mg every 3 weeks started on April 01, 2019.  Interim History: Danny Wood is here for a follow-up.  Since the last visit, he reports no major changes in his health.  He has tolerated Pembrolizumab without any major complications.  He denies any nausea, fatigue or diarrhea.  He denies any respiratory complaints.  Continues to have pain in his right groin and leg.  His pain has been manageable and able to ambulate without any difficulties.  Patient denied any alteration mental status, neuropathy, confusion or dizziness.  Denies any headaches or lethargy.  Denies any night sweats, weight loss or changes in appetite.  Denied orthopnea, dyspnea on exertion or chest discomfort.  Denies shortness of breath, difficulty breathing hemoptysis or cough.  Denies any abdominal distention, nausea, early satiety or dyspepsia.  Denies any hematuria, frequency, dysuria or nocturia.  Denies any  skin irritation, dryness or rash.  Denies any ecchymosis or petechiae.  Denies any lymphadenopathy or clotting.  Denies any heat or cold intolerance.  Denies any anxiety or depression.  Remaining review of system is negative.              Medications: Reviewed without any changes. Current Outpatient Medications  Medication Sig Dispense Refill  . lidocaine-prilocaine (EMLA) cream Apply 1 application topically as needed. 30 g 0  . ondansetron (ZOFRAN) 8 MG tablet Take 1 tablet (8 mg total) by mouth every 8 (eight) hours as needed for nausea or vomiting. 20 tablet 0  . oxyCODONE-acetaminophen (PERCOCET) 5-325 MG tablet Take 1-2 tablets by mouth every 6 (six) hours as needed for moderate pain or severe pain. 90 tablet 0   No current facility-administered medications for this visit.      Allergies: No Known Allergies  Past Medical History, Surgical history, Social history, and Family History remains without any change on review.   Physical Exam:  Blood pressure (!) 146/70, pulse 63, temperature 97.8 F (36.6 C), temperature source Temporal, resp. rate 18, weight 192 lb 3.2 oz (87.2 kg), SpO2 100 %.    ECOG: 0    General appearance: Alert, awake without any distress. Head: Atraumatic without abnormalities Oropharynx: Without any thrush or ulcers. Eyes: No scleral icterus. Lymph nodes: No lymphadenopathy noted in the cervical, supraclavicular, or axillary nodes Heart:regular rate and rhythm, without any murmurs or gallops.   Lung: Clear to auscultation without any rhonchi, wheezes or dullness to percussion. Abdomin: Soft, nontender without any shifting dullness or ascites. Musculoskeletal: Limited range of motion in his right hip due to pain.  No  erythema or induration noted in any joints. Neurological: No motor or sensory deficits. Skin: No rashes or lesions.     .          Lab Results: Lab Results  Component Value Date   WBC 5.4 04/01/2019   HGB 13.5  04/01/2019   HCT 38.4 (L) 04/01/2019   MCV 85.3 04/01/2019   PLT 174 04/01/2019     Chemistry      Component Value Date/Time   NA 133 (L) 04/01/2019 1322   NA 139 08/05/2014 1335   K 4.4 04/01/2019 1322   K 4.3 08/05/2014 1335   CL 100 04/01/2019 1322   CO2 27 04/01/2019 1322   CO2 26 08/05/2014 1335   BUN 18 04/01/2019 1322   BUN 18.2 08/05/2014 1335   CREATININE 1.19 04/01/2019 1322   CREATININE 1.1 08/05/2014 1335      Component Value Date/Time   CALCIUM 8.7 (L) 04/01/2019 1322   CALCIUM 9.5 08/05/2014 1335   ALKPHOS 105 04/01/2019 1322   ALKPHOS 98 08/05/2014 1335   AST 31 04/01/2019 1322   AST 45 (H) 08/05/2014 1335   ALT 41 04/01/2019 1322   ALT 73 (H) 08/05/2014 1335   BILITOT 0.4 04/01/2019 1322   BILITOT 0.47 08/05/2014 1335      Impression and Plan:  68 year old man with:    1.  Bladder cancer diagnosed in 2015 and subsequently developed stage IV disease with pelvic adenopathy in 2019.   He is currently receiving salvage therapy with Pembrolizumab and received the first cycle without any major complications.  Risks and benefits of continuing this approach versus different salvage options were reviewed at this time.  He is agreeable to proceed at this time.  Plan is to repeat imaging studies in October 2020.  2.  IV access: Port-A-Cath currently in use for his treatment and will continue to do so.  3.  Immune therapy considerations: I continue to educate him about potential complications to immunotherapy at this time.  These include colitis, pneumonitis, arthritis and thyroid disease.  4.  Abdominal and pelvic pain: Related to his malignancy and disease progression.  He is currently using Percocet.  He is experiencing more leg pain I have recommended repeat imaging studies specifically including MRI of the pelvis and knee for full evaluation.  Possible palliative radiation therapy can be also helpful.  At this time he would like to defer that option given  his pain is manageable and would likely not to want to proceed with radiation therapy at this time.  We will continue to monitor him clinically and use Percocet as needed.   5.  Prognosis: Therapy remains palliative although aggressive measures are warranted given his reasonable performance status.   6. Followup: In 3 weeks for repeat evaluation and next cycle of Pembrolizumab.  25  minutes was spent with the patient face-to-face today.  More than 50% of time was dedicated to updating his disease status, treatment options and complications related to therapy.  [name], MD 8/21/202012:27 PM

## 2019-04-27 ENCOUNTER — Telehealth: Payer: Self-pay | Admitting: Oncology

## 2019-04-27 NOTE — Telephone Encounter (Signed)
Called and spoke with patient. Confirmed 10/2 appt

## 2019-05-11 ENCOUNTER — Other Ambulatory Visit: Payer: Self-pay | Admitting: Oncology

## 2019-05-11 DIAGNOSIS — C670 Malignant neoplasm of trigone of bladder: Secondary | ICD-10-CM

## 2019-05-11 MED ORDER — OXYCODONE-ACETAMINOPHEN 5-325 MG PO TABS: 1.0000 | ORAL_TABLET | Freq: Four times a day (QID) | ORAL | 0 refills | Status: DC | PRN

## 2019-05-14 ENCOUNTER — Inpatient Hospital Stay: Payer: Managed Care, Other (non HMO)

## 2019-05-14 ENCOUNTER — Other Ambulatory Visit: Payer: Self-pay

## 2019-05-14 ENCOUNTER — Inpatient Hospital Stay: Payer: Managed Care, Other (non HMO) | Attending: Oncology | Admitting: Oncology

## 2019-05-14 VITALS — BP 128/70 | HR 76 | Temp 98.3°F | Resp 17 | Ht 73.0 in | Wt 192.7 lb

## 2019-05-14 DIAGNOSIS — Z95828 Presence of other vascular implants and grafts: Secondary | ICD-10-CM

## 2019-05-14 DIAGNOSIS — C679 Malignant neoplasm of bladder, unspecified: Secondary | ICD-10-CM | POA: Diagnosis present

## 2019-05-14 DIAGNOSIS — G8929 Other chronic pain: Secondary | ICD-10-CM | POA: Diagnosis not present

## 2019-05-14 DIAGNOSIS — M25561 Pain in right knee: Secondary | ICD-10-CM | POA: Diagnosis not present

## 2019-05-14 DIAGNOSIS — R112 Nausea with vomiting, unspecified: Secondary | ICD-10-CM | POA: Insufficient documentation

## 2019-05-14 DIAGNOSIS — Z5112 Encounter for antineoplastic immunotherapy: Secondary | ICD-10-CM | POA: Insufficient documentation

## 2019-05-14 DIAGNOSIS — R590 Localized enlarged lymph nodes: Secondary | ICD-10-CM | POA: Insufficient documentation

## 2019-05-14 DIAGNOSIS — C670 Malignant neoplasm of trigone of bladder: Secondary | ICD-10-CM

## 2019-05-14 DIAGNOSIS — Z79899 Other long term (current) drug therapy: Secondary | ICD-10-CM | POA: Diagnosis not present

## 2019-05-14 LAB — CMP (CANCER CENTER ONLY)
ALT: 38 U/L (ref 0–44)
AST: 29 U/L (ref 15–41)
Albumin: 3.5 g/dL (ref 3.5–5.0)
Alkaline Phosphatase: 101 U/L (ref 38–126)
Anion gap: 7 (ref 5–15)
BUN: 22 mg/dL (ref 8–23)
CO2: 28 mmol/L (ref 22–32)
Calcium: 9.1 mg/dL (ref 8.9–10.3)
Chloride: 100 mmol/L (ref 98–111)
Creatinine: 1.2 mg/dL (ref 0.61–1.24)
GFR, Est AFR Am: >60 mL/min (ref >60)
GFR, Estimated: >60 mL/min (ref >60)
Glucose, Bld: 289 mg/dL — ABNORMAL HIGH (ref 70–99)
Potassium: 4.2 mmol/L (ref 3.5–5.1)
Sodium: 135 mmol/L (ref 135–145)
Total Bilirubin: 0.4 mg/dL (ref 0.3–1.2)
Total Protein: 7.1 g/dL (ref 6.5–8.1)

## 2019-05-14 LAB — CBC WITH DIFFERENTIAL (CANCER CENTER ONLY)
Abs Immature Granulocytes: 0.01 10*3/uL (ref 0.00–0.07)
Basophils Absolute: 0 10*3/uL (ref 0.0–0.1)
Basophils Relative: 0 %
Eosinophils Absolute: 0.1 10*3/uL (ref 0.0–0.5)
Eosinophils Relative: 1 %
HCT: 37.3 % — ABNORMAL LOW (ref 39.0–52.0)
Hemoglobin: 12.9 g/dL — ABNORMAL LOW (ref 13.0–17.0)
Immature Granulocytes: 0 %
Lymphocytes Relative: 24 %
Lymphs Abs: 1.5 10*3/uL (ref 0.7–4.0)
MCH: 29.8 pg (ref 26.0–34.0)
MCHC: 34.6 g/dL (ref 30.0–36.0)
MCV: 86.1 fL (ref 80.0–100.0)
Monocytes Absolute: 0.6 10*3/uL (ref 0.1–1.0)
Monocytes Relative: 9 %
Neutro Abs: 4.2 10*3/uL (ref 1.7–7.7)
Neutrophils Relative %: 66 %
Platelet Count: 193 10*3/uL (ref 150–400)
RBC: 4.33 MIL/uL (ref 4.22–5.81)
RDW: 12.3 % (ref 11.5–15.5)
WBC Count: 6.4 10*3/uL (ref 4.0–10.5)
nRBC: 0 % (ref 0.0–0.2)

## 2019-05-14 MED ORDER — SODIUM CHLORIDE 0.9 % IV SOLN
Freq: Once | INTRAVENOUS | Status: AC
  Administered 2019-05-14: 13:00:00 via INTRAVENOUS
  Filled 2019-05-14: qty 250

## 2019-05-14 MED ORDER — SODIUM CHLORIDE 0.9% FLUSH
10.0000 mL | Freq: Once | INTRAVENOUS | Status: AC
  Administered 2019-05-14: 10 mL
  Filled 2019-05-14: qty 10

## 2019-05-14 MED ORDER — SODIUM CHLORIDE 0.9 % IV SOLN
200.0000 mg | Freq: Once | INTRAVENOUS | Status: AC
  Administered 2019-05-14: 200 mg via INTRAVENOUS
  Filled 2019-05-14: qty 8

## 2019-05-14 MED ORDER — HEPARIN SOD (PORK) LOCK FLUSH 100 UNIT/ML IV SOLN
500.0000 [IU] | Freq: Once | INTRAVENOUS | Status: AC | PRN
  Administered 2019-05-14: 500 [IU]
  Filled 2019-05-14: qty 5

## 2019-05-14 MED ORDER — SODIUM CHLORIDE 0.9% FLUSH
10.0000 mL | INTRAVENOUS | Status: DC | PRN
  Administered 2019-05-14: 10 mL
  Filled 2019-05-14: qty 10

## 2019-05-14 NOTE — Progress Notes (Signed)
Hematology and Oncology Follow Up Visit  Danny Wood [id number]:[id number] 1951-06-17 68 y.o. 05/14/2019 12:20 PM [name], [name], MD   Principle Diagnosis: 68 year old man with stage IV bladder cancer with pelvic adenopathy diagnosed in 2019 after initial diagnosis of localized bladder cancer in 2015.  Prior Therapy:  He underwent a cystoscopy and a TURBT on 05/17/2014.   Neoadjuvant systemic chemotherapy in the form of gemcitabine and cisplatin cycle 1 day 1 is on 06/17/2014. He is S/P two cycles completed in 07/2014.  Therapy tolerated poorly with symptoms of nausea and vomiting and worsening renal function.  He is status post robotic cystoprostatectomy and bilateral lymphadenectomy completed on September 09, 2014.  The final pathology revealed T2N0 without any lymph node involvement.  He developed pelvic adenopathy that was biopsy-proven on Jan 14, 2018 to be metastatic urothelial carcinoma.  Carboplatin and gemcitabine cycle 1 started on 02/17/2018.  He completed 8 cycles of therapy in December 2019.   Current therapy: Pembrolizumab 200 mg every 3 weeks started on April 01, 2019.  He is here for cycle 3 of therapy.  Interim History: Danny Wood is here for return evaluation.  Since last visit, he reports no major changes in his health.  He continues to tolerate Pembrolizumab without any complaints.  He denies any nausea, fatigue or rash.  He denies any changes in bowel habits or respiratory complaints.  Continues to have chronic knee pain without any recent exacerbation or worsening thigh pain or pelvic pain.  He ambulates without any major difficulties.  He denied headaches, blurry vision, syncope or seizures.  Denies any fevers, chills or sweats.  Denied chest pain, palpitation, orthopnea or leg edema.  Denied cough, wheezing or hemoptysis.  Denied nausea, vomiting or abdominal pain.  Denies any constipation or diarrhea.  Denies any frequency urgency or hesitancy.  Denies  any arthralgias or myalgias.  Denies any skin rashes or lesions.  Denies any bleeding or clotting tendency.  Denies any easy bruising.  Denies any hair or nail changes.  Denies any anxiety or depression.  Remaining review of system is negative.                Medications: Reviewed without changes. Current Outpatient Medications  Medication Sig Dispense Refill  . lidocaine-prilocaine (EMLA) cream Apply 1 application topically as needed. 30 g 0  . ondansetron (ZOFRAN) 8 MG tablet Take 1 tablet (8 mg total) by mouth every 8 (eight) hours as needed for nausea or vomiting. 20 tablet 0  . oxyCODONE-acetaminophen (PERCOCET) 5-325 MG tablet Take 1-2 tablets by mouth every 6 (six) hours as needed for moderate pain or severe pain. 90 tablet 0   No current facility-administered medications for this visit.      Allergies: No Known Allergies  Past Medical History, Surgical history, Social history, and Family History unchanged on review.   Physical Exam:   Blood pressure 128/70, pulse 76, temperature 98.3 F (36.8 C), temperature source Oral, resp. rate 17, height 6\' 1"  (1.854 m), weight 192 lb 11.2 oz (87.4 kg), SpO2 99 %.    ECOG: 0   General appearance: Comfortable appearing without any discomfort Head: Normocephalic without any trauma Oropharynx: Mucous membranes are moist and pink without any thrush or ulcers. Eyes: Pupils are equal and round reactive to light. Lymph nodes: No cervical, supraclavicular, inguinal or axillary lymphadenopathy.   Heart:regular rate and rhythm.  S1 and S2 without leg edema. Lung: Clear without any rhonchi or wheezes.  No dullness to percussion. Abdomin:  Soft, nontender, nondistended with good bowel sounds.  No hepatosplenomegaly. Musculoskeletal: No joint deformity or effusion.  Full range of motion noted. Neurological: No deficits noted on motor, sensory and deep tendon reflex exam. Skin: No petechial rash or dryness.  Appeared moist.       .          Lab Results: Lab Results  Component Value Date   WBC 6.0 04/23/2019   HGB 13.4 04/23/2019   HCT 37.9 (L) 04/23/2019   MCV 84.6 04/23/2019   PLT 163 04/23/2019     Chemistry      Component Value Date/Time   NA 135 04/23/2019 1227   NA 139 08/05/2014 1335   K 4.3 04/23/2019 1227   K 4.3 08/05/2014 1335   CL 102 04/23/2019 1227   CO2 25 04/23/2019 1227   CO2 26 08/05/2014 1335   BUN 18 04/23/2019 1227   BUN 18.2 08/05/2014 1335   CREATININE 1.09 04/23/2019 1227   CREATININE 1.1 08/05/2014 1335      Component Value Date/Time   CALCIUM 8.9 04/23/2019 1227   CALCIUM 9.5 08/05/2014 1335   ALKPHOS 91 04/23/2019 1227   ALKPHOS 98 08/05/2014 1335   AST 35 04/23/2019 1227   AST 45 (H) 08/05/2014 1335   ALT 46 (H) 04/23/2019 1227   ALT 73 (H) 08/05/2014 1335   BILITOT 0.6 04/23/2019 1227   BILITOT 0.47 08/05/2014 1335      Impression and Plan:  68 year old man with:    1.  Stage IV bladder cancer diagnosed in 2019 with pelvic adenopathy.     He has tolerated Pembrolizumab without any major complications.  The natural course of his disease and long-term complication associated with this therapy as well as alternative options were reviewed.  At this time he is agreeable to continue with the current therapy and the plan is to repeat imaging studies after cycle 4.  2.  IV access: Port-A-Cath remains in use without any issues at this time.  3.  Immune therapy considerations: Complication associated with this therapy include pneumonitis, colitis among others were reviewed.  4.  Right knee pain: Manageable at this time with physical therapy and conservative measures.  He has been evaluated by orthopedics at this time.   5.  Prognosis: His disease is incurable and aggressive measures are warranted to palliate his disease given his excellent performance status.   6. Followup: He will follow-up in 3 weeks for the next cycle of therapy.  25   minutes was spent with the patient face-to-face today.  More than 50% of time was spent on updating his disease status, treatment options and long-term complications letter to therapy.  [name], MD 9/11/202012:20 PM

## 2019-05-14 NOTE — Patient Instructions (Signed)
[facility] Discharge Instructions for Patients Receiving Chemotherapy  Today you received the following chemotherapy agents: Keytruda.  To help prevent nausea and vomiting after your treatment, we encourage you to take your nausea medication as directed.   If you develop nausea and vomiting that is not controlled by your nausea medication, call the clinic.   BELOW ARE SYMPTOMS THAT SHOULD BE REPORTED IMMEDIATELY:  *FEVER GREATER THAN 100.5 F  *CHILLS WITH OR WITHOUT FEVER  NAUSEA AND VOMITING THAT IS NOT CONTROLLED WITH YOUR NAUSEA MEDICATION  *UNUSUAL SHORTNESS OF BREATH  *UNUSUAL BRUISING OR BLEEDING  TENDERNESS IN MOUTH AND THROAT WITH OR WITHOUT PRESENCE OF ULCERS  *URINARY PROBLEMS  *BOWEL PROBLEMS  UNUSUAL RASH Items with * indicate a potential emergency and should be followed up as soon as possible.  Feel free to call the clinic should you have any questions or concerns. The clinic phone number is [phone].  Please show the CHEMO ALERT CARD at check-in to the Emergency Department and triage nurse.  Pembrolizumab injection What is this medicine? PEMBROLIZUMAB (pem broe liz ue mab) is a monoclonal antibody. It is used to treat bladder cancer, cervical cancer, endometrial cancer, esophageal cancer, head and neck cancer, hepatocellular cancer, Hodgkin lymphoma, kidney cancer, lymphoma, melanoma, Merkel cell carcinoma, lung cancer, stomach cancer, urothelial cancer, and cancers that have a certain genetic condition. This medicine may be used for other purposes; ask your health care provider or pharmacist if you have questions. COMMON BRAND NAME(S): Keytruda What should I tell my health care provider before I take this medicine? They need to know if you have any of these conditions:  diabetes  immune system problems  inflammatory bowel disease  liver disease  lung or breathing disease  lupus  received or scheduled to receive an organ  transplant or a stem-cell transplant that uses donor stem cells  an unusual or allergic reaction to pembrolizumab, other medicines, foods, dyes, or preservatives  pregnant or trying to get pregnant  breast-feeding How should I use this medicine? This medicine is for infusion into a vein. It is given by a health care professional in a hospital or clinic setting. A special MedGuide will be given to you before each treatment. Be sure to read this information carefully each time. Talk to your pediatrician regarding the use of this medicine in children. While this drug may be prescribed for selected conditions, precautions do apply. Overdosage: If you think you have taken too much of this medicine contact a poison control center or emergency room at once. NOTE: This medicine is only for you. Do not share this medicine with others. What if I miss a dose? It is important not to miss your dose. Call your doctor or health care professional if you are unable to keep an appointment. What may interact with this medicine? Interactions have not been studied. Give your health care provider a list of all the medicines, herbs, non-prescription drugs, or dietary supplements you use. Also tell them if you smoke, drink alcohol, or use illegal drugs. Some items may interact with your medicine. This list may not describe all possible interactions. Give your health care provider a list of all the medicines, herbs, non-prescription drugs, or dietary supplements you use. Also tell them if you smoke, drink alcohol, or use illegal drugs. Some items may interact with your medicine. What should I watch for while using this medicine? Your condition will be monitored carefully while you are receiving this medicine. You may need   blood work done while you are taking this medicine. Do not become pregnant while taking this medicine or for 4 months after stopping it. Women should inform their doctor if they wish to become  pregnant or think they might be pregnant. There is a potential for serious side effects to an unborn child. Talk to your health care professional or pharmacist for more information. Do not breast-feed an infant while taking this medicine or for 4 months after the last dose. What side effects may I notice from receiving this medicine? Side effects that you should report to your doctor or health care professional as soon as possible:  allergic reactions like skin rash, itching or hives, swelling of the face, lips, or tongue  bloody or black, tarry  breathing problems  changes in vision  chest pain  chills  confusion  constipation  cough  diarrhea  dizziness or feeling faint or lightheaded  fast or irregular heartbeat  fever  flushing  hair loss  joint pain  low blood counts - this medicine may decrease the number of white blood cells, red blood cells and platelets. You may be at increased risk for infections and bleeding.  muscle pain  muscle weakness  persistent headache  redness, blistering, peeling or loosening of the skin, including inside the mouth  signs and symptoms of high blood sugar such as dizziness; dry mouth; dry skin; fruity breath; nausea; stomach pain; increased hunger or thirst; increased urination  signs and symptoms of kidney injury like trouble passing urine or change in the amount of urine  signs and symptoms of liver injury like dark urine, light-colored stools, loss of appetite, nausea, right upper belly pain, yellowing of the eyes or skin  sweating  swollen lymph nodes  weight loss Side effects that usually do not require medical attention (report to your doctor or health care professional if they continue or are bothersome):  decreased appetite  muscle pain  tiredness This list may not describe all possible side effects. Call your doctor for medical advice about side effects. You may report side effects to FDA at  1-800-FDA-1088. Where should I keep my medicine? This drug is given in a hospital or clinic and will not be stored at home. NOTE: This sheet is a summary. It may not cover all possible information. If you have questions about this medicine, talk to your doctor, pharmacist, or health care provider.  2020 Elsevier/Gold Standard (2018-09-15 13:46:58)  

## 2019-05-14 NOTE — Patient Instructions (Signed)

## 2019-05-18 ENCOUNTER — Telehealth: Payer: Self-pay | Admitting: Oncology

## 2019-05-18 NOTE — Telephone Encounter (Signed)
Scheduled appt per 9/11 sch message - pt to get an updated schedule next visit.

## 2019-06-04 ENCOUNTER — Inpatient Hospital Stay: Payer: Managed Care, Other (non HMO)

## 2019-06-04 ENCOUNTER — Other Ambulatory Visit: Payer: Self-pay

## 2019-06-04 ENCOUNTER — Inpatient Hospital Stay: Payer: Managed Care, Other (non HMO) | Attending: Oncology

## 2019-06-04 ENCOUNTER — Inpatient Hospital Stay (HOSPITAL_BASED_OUTPATIENT_CLINIC_OR_DEPARTMENT_OTHER): Payer: Managed Care, Other (non HMO) | Admitting: Oncology

## 2019-06-04 VITALS — BP 135/69 | HR 71 | Temp 98.3°F | Resp 17 | Ht 73.0 in | Wt 194.5 lb

## 2019-06-04 DIAGNOSIS — Z79899 Other long term (current) drug therapy: Secondary | ICD-10-CM | POA: Insufficient documentation

## 2019-06-04 DIAGNOSIS — Z5112 Encounter for antineoplastic immunotherapy: Secondary | ICD-10-CM | POA: Insufficient documentation

## 2019-06-04 DIAGNOSIS — R590 Localized enlarged lymph nodes: Secondary | ICD-10-CM | POA: Insufficient documentation

## 2019-06-04 DIAGNOSIS — C787 Secondary malignant neoplasm of liver and intrahepatic bile duct: Secondary | ICD-10-CM | POA: Insufficient documentation

## 2019-06-04 DIAGNOSIS — G8929 Other chronic pain: Secondary | ICD-10-CM | POA: Diagnosis not present

## 2019-06-04 DIAGNOSIS — M79604 Pain in right leg: Secondary | ICD-10-CM | POA: Diagnosis not present

## 2019-06-04 DIAGNOSIS — R262 Difficulty in walking, not elsewhere classified: Secondary | ICD-10-CM | POA: Insufficient documentation

## 2019-06-04 DIAGNOSIS — K5900 Constipation, unspecified: Secondary | ICD-10-CM | POA: Insufficient documentation

## 2019-06-04 DIAGNOSIS — R1030 Lower abdominal pain, unspecified: Secondary | ICD-10-CM | POA: Insufficient documentation

## 2019-06-04 DIAGNOSIS — L309 Dermatitis, unspecified: Secondary | ICD-10-CM | POA: Insufficient documentation

## 2019-06-04 DIAGNOSIS — I700 Atherosclerosis of aorta: Secondary | ICD-10-CM | POA: Diagnosis not present

## 2019-06-04 DIAGNOSIS — C679 Malignant neoplasm of bladder, unspecified: Secondary | ICD-10-CM

## 2019-06-04 DIAGNOSIS — Z95828 Presence of other vascular implants and grafts: Secondary | ICD-10-CM

## 2019-06-04 DIAGNOSIS — M25561 Pain in right knee: Secondary | ICD-10-CM | POA: Diagnosis not present

## 2019-06-04 LAB — CBC WITH DIFFERENTIAL (CANCER CENTER ONLY)
Abs Immature Granulocytes: 0.01 10*3/uL (ref 0.00–0.07)
Basophils Absolute: 0 10*3/uL (ref 0.0–0.1)
Basophils Relative: 1 %
Eosinophils Absolute: 0.1 10*3/uL (ref 0.0–0.5)
Eosinophils Relative: 1 %
HCT: 38.2 % — ABNORMAL LOW (ref 39.0–52.0)
Hemoglobin: 13.3 g/dL (ref 13.0–17.0)
Immature Granulocytes: 0 %
Lymphocytes Relative: 25 %
Lymphs Abs: 1.4 10*3/uL (ref 0.7–4.0)
MCH: 30.1 pg (ref 26.0–34.0)
MCHC: 34.8 g/dL (ref 30.0–36.0)
MCV: 86.4 fL (ref 80.0–100.0)
Monocytes Absolute: 0.5 10*3/uL (ref 0.1–1.0)
Monocytes Relative: 9 %
Neutro Abs: 3.6 10*3/uL (ref 1.7–7.7)
Neutrophils Relative %: 64 %
Platelet Count: 172 10*3/uL (ref 150–400)
RBC: 4.42 MIL/uL (ref 4.22–5.81)
RDW: 12.7 % (ref 11.5–15.5)
WBC Count: 5.7 10*3/uL (ref 4.0–10.5)
nRBC: 0 % (ref 0.0–0.2)

## 2019-06-04 LAB — CMP (CANCER CENTER ONLY)
ALT: 40 U/L (ref 0–44)
AST: 30 U/L (ref 15–41)
Albumin: 3.4 g/dL — ABNORMAL LOW (ref 3.5–5.0)
Alkaline Phosphatase: 100 U/L (ref 38–126)
Anion gap: 8 (ref 5–15)
BUN: 17 mg/dL (ref 8–23)
CO2: 27 mmol/L (ref 22–32)
Calcium: 8.6 mg/dL — ABNORMAL LOW (ref 8.9–10.3)
Chloride: 101 mmol/L (ref 98–111)
Creatinine: 1.09 mg/dL (ref 0.61–1.24)
GFR, Est AFR Am: >60 mL/min (ref >60)
GFR, Estimated: >60 mL/min (ref >60)
Glucose, Bld: 296 mg/dL — ABNORMAL HIGH (ref 70–99)
Potassium: 4.3 mmol/L (ref 3.5–5.1)
Sodium: 136 mmol/L (ref 135–145)
Total Bilirubin: 0.4 mg/dL (ref 0.3–1.2)
Total Protein: 6.8 g/dL (ref 6.5–8.1)

## 2019-06-04 MED ORDER — SODIUM CHLORIDE 0.9 % IV SOLN
Freq: Once | INTRAVENOUS | Status: AC
  Administered 2019-06-04: 13:00:00 via INTRAVENOUS
  Filled 2019-06-04: qty 250

## 2019-06-04 MED ORDER — SODIUM CHLORIDE 0.9 % IV SOLN
200.0000 mg | Freq: Once | INTRAVENOUS | Status: AC
  Administered 2019-06-04: 200 mg via INTRAVENOUS
  Filled 2019-06-04: qty 8

## 2019-06-04 MED ORDER — HEPARIN SOD (PORK) LOCK FLUSH 100 UNIT/ML IV SOLN
500.0000 [IU] | Freq: Once | INTRAVENOUS | Status: AC | PRN
  Administered 2019-06-04: 500 [IU]
  Filled 2019-06-04: qty 5

## 2019-06-04 MED ORDER — SODIUM CHLORIDE 0.9% FLUSH
10.0000 mL | INTRAVENOUS | Status: DC | PRN
  Administered 2019-06-04: 10 mL
  Filled 2019-06-04: qty 10

## 2019-06-04 MED ORDER — SODIUM CHLORIDE 0.9% FLUSH
10.0000 mL | Freq: Once | INTRAVENOUS | Status: AC
  Administered 2019-06-04: 10 mL
  Filled 2019-06-04: qty 10

## 2019-06-04 NOTE — Progress Notes (Signed)
Hematology and Oncology Follow Up Visit  Danny Wood [id number]:[id number] 20-Jun-1951 68 y.o. 06/04/2019 12:20 PM Danny Wood, Danny Brow, MD   Principle Diagnosis: 68 year old man with bladder cancer diagnosed in 2015.  He subsequently developed stage IV disease with adenopathy and hepatic metastasis in 2019.  Prior Therapy:  He underwent a cystoscopy and a TURBT on 05/17/2014.   Neoadjuvant systemic chemotherapy in the form of gemcitabine and cisplatin cycle 1 day 1 is on 06/17/2014. He is S/P two cycles completed in 07/2014.  Therapy tolerated poorly with symptoms of nausea and vomiting and worsening renal function.  He is status post robotic cystoprostatectomy and bilateral lymphadenectomy completed on September 09, 2014.  The final pathology revealed T2N0 without any lymph node involvement.  He developed pelvic adenopathy that was biopsy-proven on Jan 14, 2018 to be metastatic urothelial carcinoma.  Carboplatin and gemcitabine cycle 1 started on 02/17/2018.  He completed 8 cycles of therapy in December 2019.   Current therapy: Pembrolizumab 200 mg every 3 weeks started on April 01, 2019.  He is status post 3 cycles of therapy and currently here for cycle 4.  Interim History: Danny Wood returns today for a follow-up evaluation.  Since the last visit, he completed 3 cycles of Pembrolizumab without any major complications.  He denies any skin rashes or lesions.  He denies any diarrhea or changes in his bowels.  He does report occasional constipation.  He denies any respiratory complaints including shortness of breath or dyspnea on exertion.  His performance status and quality of life remain excellent.  Continues to have chronic knee pain which is unchanged.   Patient denied any alteration mental status, neuropathy, confusion or dizziness.  Denies any headaches or lethargy.  Denies any night sweats, weight loss or changes in appetite.  Denied orthopnea, dyspnea on exertion or chest  discomfort.  Denies shortness of breath, difficulty breathing hemoptysis or cough.  Denies any abdominal distention, nausea, early satiety or dyspepsia.  Denies any hematuria, frequency, dysuria or nocturia.  Denies any skin irritation, dryness or rash.  Denies any ecchymosis or petechiae.  Denies any lymphadenopathy or clotting.  Denies any heat or cold intolerance.  Denies any anxiety or depression.  Remaining review of system is negative.      Medications: Updated on review today Current Outpatient Medications  Medication Sig Dispense Refill  . lidocaine-prilocaine (EMLA) cream Apply 1 application topically as needed. 30 g 0  . ondansetron (ZOFRAN) 8 MG tablet Take 1 tablet (8 mg total) by mouth every 8 (eight) hours as needed for nausea or vomiting. 20 tablet 0  . oxyCODONE-acetaminophen (PERCOCET) 5-325 MG tablet Take 1-2 tablets by mouth every 6 (six) hours as needed for moderate pain or severe pain. 90 tablet 0   No current facility-administered medications for this visit.      Allergies: No Known Allergies  Past Medical History, Surgical history, Social history, and Family History reviewed without any changes.   Physical Exam:   Blood pressure 135/69, pulse 71, temperature 98.3 F (36.8 C), temperature source Oral, resp. rate 17, height 6\' 1"  (1.854 m), weight 194 lb 8 oz (88.2 kg), SpO2 99 %.    ECOG: 0    General appearance: Alert, awake without any distress. Head: Atraumatic without abnormalities Oropharynx: Without any thrush or ulcers. Eyes: No scleral icterus. Lymph nodes: No lymphadenopathy noted in the cervical, supraclavicular, or axillary nodes Heart:regular rate and rhythm, without any murmurs or gallops.   Lung: Clear to auscultation without any  rhonchi, wheezes or dullness to percussion. Abdomin: Soft, nontender without any shifting dullness or ascites. Musculoskeletal: No clubbing or cyanosis. Neurological: No motor or sensory deficits. Skin: No  rashes or lesions.      .          Lab Results: Lab Results  Component Value Date   WBC 5.7 06/04/2019   HGB 13.3 06/04/2019   HCT 38.2 (L) 06/04/2019   MCV 86.4 06/04/2019   PLT 172 06/04/2019     Chemistry      Component Value Date/Time   NA 135 05/14/2019 1218   NA 139 08/05/2014 1335   K 4.2 05/14/2019 1218   K 4.3 08/05/2014 1335   CL 100 05/14/2019 1218   CO2 28 05/14/2019 1218   CO2 26 08/05/2014 1335   BUN 22 05/14/2019 1218   BUN 18.2 08/05/2014 1335   CREATININE 1.20 05/14/2019 1218   CREATININE 1.1 08/05/2014 1335      Component Value Date/Time   CALCIUM 9.1 05/14/2019 1218   CALCIUM 9.5 08/05/2014 1335   ALKPHOS 101 05/14/2019 1218   ALKPHOS 98 08/05/2014 1335   AST 29 05/14/2019 1218   AST 45 (H) 08/05/2014 1335   ALT 38 05/14/2019 1218   ALT 73 (H) 08/05/2014 1335   BILITOT 0.4 05/14/2019 1218   BILITOT 0.47 08/05/2014 1335      Impression and Plan:  68 year old man with:    1.  Bladder cancer diagnosed in 2019 with pelvic adenopathy indicating stage IV at this time.  He continues to tolerate Enbrel without any major complications.  Risks and benefits of proceeding with this therapy to complete 4 cycles today was reviewed.  Potential complications were reiterated and he is agreeable to proceed.  He will have repeat imaging studies before the next cycle.  Alternative options including returning back to systemic chemotherapy or potentially Padcev.   2.  IV access: Port-A-Cath remains in place and accessed without any difficulties.  3.  Immune therapy considerations: Pneumonitis, colitis and thyroid disease were reiterated as a potential complications.  He is not experiencing any.  4.  Right knee pain: Appears to be chronic in nature and unrelated to malignancy.  Manageable at this time.   5.  Prognosis: Therapy remains palliative and his disease is incurable.  Aggressive measures are warranted at this time.   6. Followup: In 3  weeks for the next cycle of therapy after repeat imaging studies.  25  minutes was spent with the patient face-to-face today.  More than 50% of time was dedicated to reviewing his laboratory data, disease status update, treatment options and answering questions regarding future plan of care.  [name], MD 10/2/202012:20 PM

## 2019-06-04 NOTE — Patient Instructions (Signed)
[facility] Discharge Instructions for Patients Receiving Chemotherapy  Today you received the following chemotherapy agents: Keytruda.  To help prevent nausea and vomiting after your treatment, we encourage you to take your nausea medication as directed.   If you develop nausea and vomiting that is not controlled by your nausea medication, call the clinic.   BELOW ARE SYMPTOMS THAT SHOULD BE REPORTED IMMEDIATELY:  *FEVER GREATER THAN 100.5 F  *CHILLS WITH OR WITHOUT FEVER  NAUSEA AND VOMITING THAT IS NOT CONTROLLED WITH YOUR NAUSEA MEDICATION  *UNUSUAL SHORTNESS OF BREATH  *UNUSUAL BRUISING OR BLEEDING  TENDERNESS IN MOUTH AND THROAT WITH OR WITHOUT PRESENCE OF ULCERS  *URINARY PROBLEMS  *BOWEL PROBLEMS  UNUSUAL RASH Items with * indicate a potential emergency and should be followed up as soon as possible.  Feel free to call the clinic should you have any questions or concerns. The clinic phone number is [phone].  Please show the CHEMO ALERT CARD at check-in to the Emergency Department and triage nurse.  Pembrolizumab injection What is this medicine? PEMBROLIZUMAB (pem broe liz ue mab) is a monoclonal antibody. It is used to treat bladder cancer, cervical cancer, endometrial cancer, esophageal cancer, head and neck cancer, hepatocellular cancer, Hodgkin lymphoma, kidney cancer, lymphoma, melanoma, Merkel cell carcinoma, lung cancer, stomach cancer, urothelial cancer, and cancers that have a certain genetic condition. This medicine may be used for other purposes; ask your health care provider or pharmacist if you have questions. COMMON BRAND NAME(S): Keytruda What should I tell my health care provider before I take this medicine? They need to know if you have any of these conditions:  diabetes  immune system problems  inflammatory bowel disease  liver disease  lung or breathing disease  lupus  received or scheduled to receive an organ  transplant or a stem-cell transplant that uses donor stem cells  an unusual or allergic reaction to pembrolizumab, other medicines, foods, dyes, or preservatives  pregnant or trying to get pregnant  breast-feeding How should I use this medicine? This medicine is for infusion into a vein. It is given by a health care professional in a hospital or clinic setting. A special MedGuide will be given to you before each treatment. Be sure to read this information carefully each time. Talk to your pediatrician regarding the use of this medicine in children. While this drug may be prescribed for selected conditions, precautions do apply. Overdosage: If you think you have taken too much of this medicine contact a poison control center or emergency room at once. NOTE: This medicine is only for you. Do not share this medicine with others. What if I miss a dose? It is important not to miss your dose. Call your doctor or health care professional if you are unable to keep an appointment. What may interact with this medicine? Interactions have not been studied. Give your health care provider a list of all the medicines, herbs, non-prescription drugs, or dietary supplements you use. Also tell them if you smoke, drink alcohol, or use illegal drugs. Some items may interact with your medicine. This list may not describe all possible interactions. Give your health care provider a list of all the medicines, herbs, non-prescription drugs, or dietary supplements you use. Also tell them if you smoke, drink alcohol, or use illegal drugs. Some items may interact with your medicine. What should I watch for while using this medicine? Your condition will be monitored carefully while you are receiving this medicine. You may need   blood work done while you are taking this medicine. Do not become pregnant while taking this medicine or for 4 months after stopping it. Women should inform their doctor if they wish to become  pregnant or think they might be pregnant. There is a potential for serious side effects to an unborn child. Talk to your health care professional or pharmacist for more information. Do not breast-feed an infant while taking this medicine or for 4 months after the last dose. What side effects may I notice from receiving this medicine? Side effects that you should report to your doctor or health care professional as soon as possible:  allergic reactions like skin rash, itching or hives, swelling of the face, lips, or tongue  bloody or black, tarry  breathing problems  changes in vision  chest pain  chills  confusion  constipation  cough  diarrhea  dizziness or feeling faint or lightheaded  fast or irregular heartbeat  fever  flushing  hair loss  joint pain  low blood counts - this medicine may decrease the number of white blood cells, red blood cells and platelets. You may be at increased risk for infections and bleeding.  muscle pain  muscle weakness  persistent headache  redness, blistering, peeling or loosening of the skin, including inside the mouth  signs and symptoms of high blood sugar such as dizziness; dry mouth; dry skin; fruity breath; nausea; stomach pain; increased hunger or thirst; increased urination  signs and symptoms of kidney injury like trouble passing urine or change in the amount of urine  signs and symptoms of liver injury like dark urine, light-colored stools, loss of appetite, nausea, right upper belly pain, yellowing of the eyes or skin  sweating  swollen lymph nodes  weight loss Side effects that usually do not require medical attention (report to your doctor or health care professional if they continue or are bothersome):  decreased appetite  muscle pain  tiredness This list may not describe all possible side effects. Call your doctor for medical advice about side effects. You may report side effects to FDA at  1-800-FDA-1088. Where should I keep my medicine? This drug is given in a hospital or clinic and will not be stored at home. NOTE: This sheet is a summary. It may not cover all possible information. If you have questions about this medicine, talk to your doctor, pharmacist, or health care provider.  2020 Elsevier/Gold Standard (2018-09-15 13:46:58)  

## 2019-06-18 ENCOUNTER — Other Ambulatory Visit: Payer: Self-pay

## 2019-06-18 ENCOUNTER — Ambulatory Visit (HOSPITAL_COMMUNITY)
Admission: RE | Admit: 2019-06-18 | Discharge: 2019-06-18 | Disposition: A | Discharge status: Home / Self Care | Payer: Managed Care, Other (non HMO) | Source: Ambulatory Visit | Attending: Oncology | Admitting: Oncology

## 2019-06-18 DIAGNOSIS — C679 Malignant neoplasm of bladder, unspecified: Secondary | ICD-10-CM | POA: Diagnosis present

## 2019-06-18 MED ORDER — HEPARIN SOD (PORK) LOCK FLUSH 100 UNIT/ML IV SOLN
500.0000 [IU] | Freq: Once | INTRAVENOUS | Status: AC
  Administered 2019-06-18: 500 [IU] via INTRAVENOUS

## 2019-06-18 MED ORDER — HEPARIN SOD (PORK) LOCK FLUSH 100 UNIT/ML IV SOLN
INTRAVENOUS | Status: AC
  Filled 2019-06-18: qty 5

## 2019-06-18 MED ORDER — IOHEXOL 300 MG/ML  SOLN
100.0000 mL | Freq: Once | INTRAMUSCULAR | Status: AC | PRN
  Administered 2019-06-18: 100 mL via INTRAVENOUS

## 2019-06-18 MED ORDER — SODIUM CHLORIDE (PF) 0.9 % IJ SOLN
INTRAMUSCULAR | Status: AC
  Filled 2019-06-18: qty 50

## 2019-06-25 ENCOUNTER — Inpatient Hospital Stay: Payer: Managed Care, Other (non HMO) | Admitting: Oncology

## 2019-06-25 ENCOUNTER — Inpatient Hospital Stay: Payer: Managed Care, Other (non HMO)

## 2019-06-25 ENCOUNTER — Other Ambulatory Visit: Payer: Managed Care, Other (non HMO)

## 2019-06-25 ENCOUNTER — Telehealth: Payer: Self-pay | Admitting: Oncology

## 2019-06-25 ENCOUNTER — Inpatient Hospital Stay (HOSPITAL_BASED_OUTPATIENT_CLINIC_OR_DEPARTMENT_OTHER): Payer: Managed Care, Other (non HMO) | Admitting: Oncology

## 2019-06-25 ENCOUNTER — Other Ambulatory Visit: Payer: Self-pay

## 2019-06-25 ENCOUNTER — Telehealth: Payer: Self-pay | Admitting: Emergency Medicine

## 2019-06-25 VITALS — BP 138/73 | HR 82 | Temp 97.8°F | Resp 18 | Ht 73.0 in | Wt 196.7 lb

## 2019-06-25 DIAGNOSIS — C679 Malignant neoplasm of bladder, unspecified: Secondary | ICD-10-CM

## 2019-06-25 DIAGNOSIS — Z5112 Encounter for antineoplastic immunotherapy: Secondary | ICD-10-CM | POA: Diagnosis not present

## 2019-06-25 DIAGNOSIS — Z95828 Presence of other vascular implants and grafts: Secondary | ICD-10-CM

## 2019-06-25 LAB — CBC WITH DIFFERENTIAL (CANCER CENTER ONLY)
Abs Immature Granulocytes: 0.02 10*3/uL (ref 0.00–0.07)
Basophils Absolute: 0 10*3/uL (ref 0.0–0.1)
Basophils Relative: 0 %
Eosinophils Absolute: 0.1 10*3/uL (ref 0.0–0.5)
Eosinophils Relative: 1 %
HCT: 38.6 % — ABNORMAL LOW (ref 39.0–52.0)
Hemoglobin: 13.9 g/dL (ref 13.0–17.0)
Immature Granulocytes: 0 %
Lymphocytes Relative: 28 %
Lymphs Abs: 1.7 10*3/uL (ref 0.7–4.0)
MCH: 30.4 pg (ref 26.0–34.0)
MCHC: 36 g/dL (ref 30.0–36.0)
MCV: 84.5 fL (ref 80.0–100.0)
Monocytes Absolute: 0.5 10*3/uL (ref 0.1–1.0)
Monocytes Relative: 8 %
Neutro Abs: 3.7 10*3/uL (ref 1.7–7.7)
Neutrophils Relative %: 63 %
Platelet Count: 206 10*3/uL (ref 150–400)
RBC: 4.57 MIL/uL (ref 4.22–5.81)
RDW: 12.5 % (ref 11.5–15.5)
WBC Count: 6 10*3/uL (ref 4.0–10.5)
nRBC: 0 % (ref 0.0–0.2)

## 2019-06-25 LAB — CMP (CANCER CENTER ONLY)
ALT: 37 U/L (ref 0–44)
AST: 27 U/L (ref 15–41)
Albumin: 3.4 g/dL — ABNORMAL LOW (ref 3.5–5.0)
Alkaline Phosphatase: 108 U/L (ref 38–126)
Anion gap: 8 (ref 5–15)
BUN: 18 mg/dL (ref 8–23)
CO2: 28 mmol/L (ref 22–32)
Calcium: 9 mg/dL (ref 8.9–10.3)
Chloride: 100 mmol/L (ref 98–111)
Creatinine: 1.15 mg/dL (ref 0.61–1.24)
GFR, Est AFR Am: >60 mL/min (ref >60)
GFR, Estimated: >60 mL/min (ref >60)
Glucose, Bld: 300 mg/dL — ABNORMAL HIGH (ref 70–99)
Potassium: 4 mmol/L (ref 3.5–5.1)
Sodium: 136 mmol/L (ref 135–145)
Total Bilirubin: 0.4 mg/dL (ref 0.3–1.2)
Total Protein: 6.9 g/dL (ref 6.5–8.1)

## 2019-06-25 MED ORDER — SODIUM CHLORIDE 0.9% FLUSH
10.0000 mL | INTRAVENOUS | Status: DC | PRN
  Administered 2019-06-25: 10 mL
  Filled 2019-06-25: qty 10

## 2019-06-25 MED ORDER — SODIUM CHLORIDE 0.9 % IV SOLN
Freq: Once | INTRAVENOUS | Status: AC
  Administered 2019-06-25: 14:00:00 via INTRAVENOUS
  Filled 2019-06-25: qty 250

## 2019-06-25 MED ORDER — SODIUM CHLORIDE 0.9 % IV SOLN
200.0000 mg | Freq: Once | INTRAVENOUS | Status: AC
  Administered 2019-06-25: 200 mg via INTRAVENOUS
  Filled 2019-06-25: qty 8

## 2019-06-25 MED ORDER — HEPARIN SOD (PORK) LOCK FLUSH 100 UNIT/ML IV SOLN
500.0000 [IU] | Freq: Once | INTRAVENOUS | Status: AC | PRN
  Administered 2019-06-25: 500 [IU]
  Filled 2019-06-25: qty 5

## 2019-06-25 MED ORDER — SODIUM CHLORIDE 0.9% FLUSH
10.0000 mL | Freq: Once | INTRAVENOUS | Status: DC
  Filled 2019-06-25: qty 10

## 2019-06-25 NOTE — Progress Notes (Signed)
Hematology and Oncology Follow Up Visit  Danny Wood [id number] Jul 08, 1951 68 y.o. 06/25/2019 1:28 PM [name], [name], MD   Principle Diagnosis: 69 year old man with stage IV bladder cancer with adenopathy and hepatic involvement diagnosed in 2019.  He was found to have localized disease in 2015.    Prior Therapy:  He underwent a cystoscopy and a TURBT on 05/17/2014.   Neoadjuvant systemic chemotherapy in the form of gemcitabine and cisplatin cycle 1 day 1 is on 06/17/2014. He is S/P two cycles completed in 07/2014.  Therapy tolerated poorly with symptoms of nausea and vomiting and worsening renal function.  He is status post robotic cystoprostatectomy and bilateral lymphadenectomy completed on September 09, 2014.  The final pathology revealed T2N0 without any lymph node involvement.  He developed pelvic adenopathy that was biopsy-proven on Jan 14, 2018 to be metastatic urothelial carcinoma.  Carboplatin and gemcitabine cycle 1 started on 02/17/2018.  He completed 8 cycles of therapy in December 2019.   Current therapy: Pembrolizumab 200 mg every 3 weeks started on April 01, 2019.  He is status post 3 cycles of therapy and currently here for cycle 5.  Interim History: [name] is here for a follow-up visit.  Since the last visit, he has tolerated Pembrolizumab without any major complications.  He denies any nausea, vomiting or respiratory complaints.  He denies any cough or shortness of breath.  He denies any diarrhea or abdominal distention.  He continues to have leg pain predominantly on the inner thigh and radiating across his quadriceps muscle on the right side.  He is still ambulating although is unable to walk up stairs.  He denies any recent falls or syncope.  He denied headaches, blurry vision, syncope or seizures.  Denies any fevers, chills or sweats.  Denied chest pain, palpitation, orthopnea or leg edema.  Denied cough, wheezing or hemoptysis.  Denied  nausea, vomiting or abdominal pain.  Denies any constipation or diarrhea.  Denies any frequency urgency or hesitancy.  Denies any arthralgias or myalgias.  Denies any skin rashes or lesions.  Denies any bleeding or clotting tendency.  Denies any easy bruising.  Denies any hair or nail changes.  Denies any anxiety or depression.  Remaining review of system is negative.        Medications: Updated today and discussed without any changes. Current Outpatient Medications  Medication Sig Dispense Refill  . lidocaine-prilocaine (EMLA) cream Apply 1 application topically as needed. 30 g 0  . ondansetron (ZOFRAN) 8 MG tablet Take 1 tablet (8 mg total) by mouth every 8 (eight) hours as needed for nausea or vomiting. 20 tablet 0  . oxyCODONE-acetaminophen (PERCOCET) 5-325 MG tablet Take 1-2 tablets by mouth every 6 (six) hours as needed for moderate pain or severe pain. 90 tablet 0   No current facility-administered medications for this visit.      Allergies: No Known Allergies  Past Medical History, Surgical history, Social history, and Family History unchanged on review.   Physical Exam:   Blood pressure 138/73, pulse 82, temperature 97.8 F (36.6 C), temperature source Oral, resp. rate 18, height 6\' 1"  (1.854 m), weight 196 lb 11.2 oz (89.2 kg), SpO2 99 %.     ECOG: 0   General appearance: Comfortable appearing without any discomfort Head: Normocephalic without any trauma Oropharynx: Mucous membranes are moist and pink without any thrush or ulcers. Eyes: Pupils are equal and round reactive to light. Lymph nodes: No cervical, supraclavicular, inguinal or axillary lymphadenopathy.  Heart:regular rate and rhythm.  S1 and S2 without leg edema. Lung: Clear without any rhonchi or wheezes.  No dullness to percussion. Abdomin: Soft, nontender, nondistended with good bowel sounds.  No hepatosplenomegaly. Musculoskeletal: Painful range of motion in his right hip including internal and  external rotation.  Normal hip flexion and extension.  No effusion noted on his right knee or hip. Neurological: No deficits noted on motor, sensory and deep tendon reflex exam. Skin: No petechial rash or dryness.  Appeared moist.        .   Lab Results: Lab Results  Component Value Date   WBC 5.7 06/04/2019   HGB 13.3 06/04/2019   HCT 38.2 (L) 06/04/2019   MCV 86.4 06/04/2019   PLT 172 06/04/2019     Chemistry      Component Value Date/Time   NA 136 06/04/2019 1202   NA 139 08/05/2014 1335   K 4.3 06/04/2019 1202   K 4.3 08/05/2014 1335   CL 101 06/04/2019 1202   CO2 27 06/04/2019 1202   CO2 26 08/05/2014 1335   BUN 17 06/04/2019 1202   BUN 18.2 08/05/2014 1335   CREATININE 1.09 06/04/2019 1202   CREATININE 1.1 08/05/2014 1335      Component Value Date/Time   CALCIUM 8.6 (L) 06/04/2019 1202   CALCIUM 9.5 08/05/2014 1335   ALKPHOS 100 06/04/2019 1202   ALKPHOS 98 08/05/2014 1335   AST 30 06/04/2019 1202   AST 45 (H) 08/05/2014 1335   ALT 40 06/04/2019 1202   ALT 73 (H) 08/05/2014 1335   BILITOT 0.4 06/04/2019 1202   BILITOT 0.47 08/05/2014 1335      EXAM: CT CHEST, ABDOMEN, AND PELVIS WITH CONTRAST  TECHNIQUE: Multidetector CT imaging of the chest, abdomen and pelvis was performed following the standard protocol during bolus administration of intravenous contrast.  CONTRAST:  141mL OMNIPAQUE IOHEXOL 300 MG/ML  SOLN  COMPARISON:  03/08/2019  FINDINGS: CT CHEST FINDINGS  Cardiovascular: 1 The heart size is normal. No substantial pericardial effusion. Coronary artery calcification is evident. Atherosclerotic calcification is noted in the wall of the thoracic aorta. Right Port-A-Cath tip is positioned at the SVC/RA junction.  Mediastinum/Nodes: No mediastinal lymphadenopathy. There is no hilar lymphadenopathy. The esophagus has normal imaging features. There is no axillary lymphadenopathy.  Lungs/Pleura: Interval development of a 3.0 x 1.9  cm cavitary lesion in the right lung apex (34/4). Other scattered ground-glass opacities are seen in the adjacent lung parenchyma of the right apex. New patchy ground-glass opacities noted in the left lung apex. No pleural effusion.  Musculoskeletal: No worrisome lytic or sclerotic osseous abnormality.  CT ABDOMEN PELVIS FINDINGS  Hepatobiliary: No suspicious focal abnormality within the liver parenchyma. There is no evidence for gallstones, gallbladder wall thickening, or pericholecystic fluid. No intrahepatic or extrahepatic biliary dilation.  Pancreas: No focal mass lesion. No dilatation of the main duct. No intraparenchymal cyst. No peripancreatic edema.  Spleen: No splenomegaly. No focal mass lesion.  Adrenals/Urinary Tract: No adrenal nodule or mass. Cortical scarring noted right kidney. Stable 3.9 cm exophytic cyst interpolar left kidney. No wall thickening or intraluminal soft tissue defect in either intrarenal collecting system or proximal ureter. No hydroureter. Status post cysto prostatectomy with right lower quadrant ileal diversion.  Stomach/Bowel: Stomach is unremarkable. No gastric wall thickening. No evidence of outlet obstruction. Duodenum is normally positioned as is the ligament of Treitz. No small bowel wall thickening. No small bowel dilatation. The terminal ileum is normal. The appendix is normal. No  gross colonic mass. No colonic wall thickening.  Vascular/Lymphatic: There is abdominal aortic atherosclerosis without aneurysm. There is no gastrohepatic or hepatoduodenal ligament lymphadenopathy. The retroperitoneal lymphadenopathy seen previously has resolved.  *Index retroperitoneal node near the left renal vein measured previously at 11.5 mm short axis is now 6 mm short axis (79/2). *The left para-aortic nodal conglomeration measured at 1.5 cm short axis previously is now 0.7 cm. *A dominant 1.5 cm short axis low left para-aortic node  measured previously is now 0.5 cm (86/2). *Previously described left external iliac node measured at 1.2 cm short axis is now 0.5 cm short axis on 122/2. *2.2 cm short axis left groin node measured on the previous study has decreased to 1.4 cm short axis today (136/2). *High left groin node measured previously at 1.9 cm short axis is now 1.1 cm. *1.8 cm short axis right groin node measured previously has decreased to 1.2 cm today (127/2).  Reproductive: Status post cysto prostatectomy.  Other: No substantial intraperitoneal free fluid.  Musculoskeletal: No worrisome lytic or sclerotic osseous abnormality.  IMPRESSION: 1. Interval development of a 3.0 x 1.9 cm cavitary lesion in the right lung apex with nodular ground-glass attenuation in both lung apices. This is likely infectious/inflammatory and drug toxicity is not excluded. Neoplasm could have this appearance. Close attention on follow-up recommended. 2. Marked interval decrease in size of retroperitoneal and bilateral groin lymphadenopathy. The abnormal soft tissue seen in the right pelvis adjacent to the sigmoid colon on the previous study has resolved. 3. Status post cysto prostatectomy with right lower quadrant ileal diversion. 4. Aortic Atherosclerosis (ICD10-I70.0).    Impression and Plan:  68 year old man with:    1.  Stage IV bladder cancer diagnosed in 2019 with hepatic involvement and lymphadenopathy  He has completed 4 cycles of Pembrolizumab without any major complications.  CT scan obtained on 06/18/2019 was personally reviewed and showed positive response to therapy including his retroperitoneal and inguinal adenopathy.  He did have a 3.0 x 1.9 cavitary lesion in the right lung of unknown etiology.  Malignancy versus inflammatory reaction are possibility.  Risks and benefits of continuing Pembrolizumab at this time was reviewed and he is agreeable to continue he started developing pulmonary symptoms  then we will hold his therapy at this time and presume that the lung lesion is a manifestation of pneumonitis.  2.  IV access: Port-A-Cath currently in use and will be accessed and used for treatment today.  3.  Immune therapy considerations: It is unclear if the lung lesion is this manifestation of pneumonitis.  I continue to educate him about developing respiratory symptoms including cough, wheezing or shortness of breath.  Other complications occluding colitis, dermatitis and thyroid disease were also reiterated.  4.  Right leg and groin pain: Unclear etiology.  Imaging studies were personally reviewed and did not show any abnormalities in his right hip.  This could be musculoskeletal in nature including muscle pull or any usual site of metastasis in his thigh muscles or bones.  He has been seen by orthopedics in the past although no clear answer is available at this time.  Recommend continued conservative management at this time and consider imaging studies if it worsens.   5.  Prognosis: His disease is incurable although aggressive measures are warranted given his excellent performance status.   6. Followup: He will return in 3 months for the next cycle of therapy.  25  minutes was spent with the patient face-to-face today.  More than  50% of time was spent on reviewing his disease status, reviewing imaging studies and management options for the future.  [name], MD 10/23/20201:28 PM

## 2019-06-25 NOTE — Progress Notes (Signed)
Okay to treat without CMP today per Dr. Alen Blew.

## 2019-06-25 NOTE — Telephone Encounter (Signed)
Scheduled per los. Called and spoke with patient. Confirmed appts  

## 2019-06-25 NOTE — Patient Instructions (Signed)
Pembrolizumab injection What is this medicine? PEMBROLIZUMAB (pem broe liz ue mab) is a monoclonal antibody. It is used to treat bladder cancer, cervical cancer, endometrial cancer, esophageal cancer, head and neck cancer, hepatocellular cancer, Hodgkin lymphoma, kidney cancer, lymphoma, melanoma, Merkel cell carcinoma, lung cancer, stomach cancer, urothelial cancer, and cancers that have a certain genetic condition. This medicine may be used for other purposes; ask your health care provider or pharmacist if you have questions. COMMON BRAND NAME(S): Keytruda What should I tell my health care provider before I take this medicine? They need to know if you have any of these conditions:  diabetes  immune system problems  inflammatory bowel disease  liver disease  lung or breathing disease  lupus  received or scheduled to receive an organ transplant or a stem-cell transplant that uses donor stem cells  an unusual or allergic reaction to pembrolizumab, other medicines, foods, dyes, or preservatives  pregnant or trying to get pregnant  breast-feeding How should I use this medicine? This medicine is for infusion into a vein. It is given by a health care professional in a hospital or clinic setting. A special MedGuide will be given to you before each treatment. Be sure to read this information carefully each time. Talk to your pediatrician regarding the use of this medicine in children. While this drug may be prescribed for selected conditions, precautions do apply. Overdosage: If you think you have taken too much of this medicine contact a poison control center or emergency room at once. NOTE: This medicine is only for you. Do not share this medicine with others. What if I miss a dose? It is important not to miss your dose. Call your doctor or health care professional if you are unable to keep an appointment. What may interact with this medicine? Interactions have not been studied. Give  your health care provider a list of all the medicines, herbs, non-prescription drugs, or dietary supplements you use. Also tell them if you smoke, drink alcohol, or use illegal drugs. Some items may interact with your medicine. This list may not describe all possible interactions. Give your health care provider a list of all the medicines, herbs, non-prescription drugs, or dietary supplements you use. Also tell them if you smoke, drink alcohol, or use illegal drugs. Some items may interact with your medicine. What should I watch for while using this medicine? Your condition will be monitored carefully while you are receiving this medicine. You may need blood work done while you are taking this medicine. Do not become pregnant while taking this medicine or for 4 months after stopping it. Women should inform their doctor if they wish to become pregnant or think they might be pregnant. There is a potential for serious side effects to an unborn child. Talk to your health care professional or pharmacist for more information. Do not breast-feed an infant while taking this medicine or for 4 months after the last dose. What side effects may I notice from receiving this medicine? Side effects that you should report to your doctor or health care professional as soon as possible:  allergic reactions like skin rash, itching or hives, swelling of the face, lips, or tongue  bloody or black, tarry  breathing problems  changes in vision  chest pain  chills  confusion  constipation  cough  diarrhea  dizziness or feeling faint or lightheaded  fast or irregular heartbeat  fever  flushing  hair loss  joint pain  low blood counts - this   medicine may decrease the number of white blood cells, red blood cells and platelets. You may be at increased risk for infections and bleeding.  muscle pain  muscle weakness  persistent headache  redness, blistering, peeling or loosening of the skin,  including inside the mouth  signs and symptoms of high blood sugar such as dizziness; dry mouth; dry skin; fruity breath; nausea; stomach pain; increased hunger or thirst; increased urination  signs and symptoms of kidney injury like trouble passing urine or change in the amount of urine  signs and symptoms of liver injury like dark urine, light-colored stools, loss of appetite, nausea, right upper belly pain, yellowing of the eyes or skin  sweating  swollen lymph nodes  weight loss Side effects that usually do not require medical attention (report to your doctor or health care professional if they continue or are bothersome):  decreased appetite  muscle pain  tiredness This list may not describe all possible side effects. Call your doctor for medical advice about side effects. You may report side effects to FDA at 1-800-FDA-1088. Where should I keep my medicine? This drug is given in a hospital or clinic and will not be stored at home. NOTE: This sheet is a summary. It may not cover all possible information. If you have questions about this medicine, talk to your doctor, pharmacist, or health care provider.  2020 Elsevier/Gold Standard (2018-09-15 13:46:58)  Coronavirus (COVID-19) Are you at risk?  Are you at risk for the Coronavirus (COVID-19)?  To be considered HIGH RISK for Coronavirus (COVID-19), you have to meet the following criteria:  . Traveled to [place], [place], [place], [place] or [place]; or in the [place] to [place], [place], [place], or [place]; and have fever, cough, and shortness of breath within the last 2 weeks of travel OR . Been in close contact with a person diagnosed with COVID-19 within the last 2 weeks and have fever, cough, and shortness of breath . IF YOU DO NOT MEET THESE CRITERIA, YOU ARE CONSIDERED LOW RISK FOR COVID-19.  What to do if you are HIGH RISK for COVID-19?  . If you are having a medical emergency, call 911. . Seek medical  care right away. Before you go to a doctor's office, urgent care or emergency department, call ahead and tell them about your recent travel, contact with someone diagnosed with COVID-19, and your symptoms. You should receive instructions from your physician's office regarding next steps of care.  . When you arrive at healthcare provider, tell the healthcare staff immediately you have returned from visiting [place], [place], [place], [place] or [place]; or traveled in the [place] to [place], [place], [place], or [place]; in the last two weeks or you have been in close contact with a person diagnosed with COVID-19 in the last 2 weeks.   . Tell the health care staff about your symptoms: fever, cough and shortness of breath. . After you have been seen by a medical provider, you will be either: o Tested for (COVID-19) and discharged home on quarantine except to seek medical care if symptoms worsen, and asked to  - Stay home and avoid contact with others until you get your results (4-5 days)  - Avoid travel on public transportation if possible (such as bus, train, or airplane) or o Sent to the Emergency Department by EMS for evaluation, COVID-19 testing, and possible admission depending on your condition and test results.  What to do if you are   LOW RISK for COVID-19?  Reduce your risk of any infection by using the same precautions used for avoiding the common cold or flu:  . Wash your hands often with soap and warm water for at least 20 seconds.  If soap and water are not readily available, use an alcohol-based hand sanitizer with at least 60% alcohol.  . If coughing or sneezing, cover your mouth and nose by coughing or sneezing into the elbow areas of your shirt or coat, into a tissue or into your sleeve (not your hands). . Avoid shaking hands with others and consider head nods or verbal greetings only. . Avoid touching your eyes, nose, or mouth with unwashed hands.  . Avoid close contact with  people who are sick. . Avoid places or events with large numbers of people in one location, like concerts or sporting events. . Carefully consider travel plans you have or are making. . If you are planning any travel outside or inside the [place], visit the CDC's Travelers' Health webpage for the latest health notices. . If you have some symptoms but not all symptoms, continue to monitor at home and seek medical attention if your symptoms worsen. . If you are having a medical emergency, call 911.   ADDITIONAL HEALTHCARE OPTIONS FOR PATIENTS  [facility] / e-Visit: [email or web address]         [facility]: [phone]  [facility]: [phone]                   [facility]: [phone]   

## 2019-06-25 NOTE — Telephone Encounter (Signed)
Called pt regarding appts for today, no answer.  Alerted Seth Bake RN for MD Alen Blew.

## 2019-07-06 ENCOUNTER — Other Ambulatory Visit: Payer: Self-pay | Admitting: Oncology

## 2019-07-06 DIAGNOSIS — C670 Malignant neoplasm of trigone of bladder: Secondary | ICD-10-CM

## 2019-07-06 MED ORDER — OXYCODONE-ACETAMINOPHEN 5-325 MG PO TABS: 1.0000 | ORAL_TABLET | Freq: Four times a day (QID) | ORAL | 0 refills | Status: DC | PRN

## 2019-07-07 ENCOUNTER — Telehealth: Payer: Self-pay

## 2019-07-07 NOTE — Telephone Encounter (Signed)
Received message for the patient following up on the new prescription for pain medication. Explained that a new prescription was sent to [facility] pharmacy yesterday. Patient verbalized understanding.

## 2019-07-16 ENCOUNTER — Inpatient Hospital Stay: Payer: Managed Care, Other (non HMO)

## 2019-07-16 ENCOUNTER — Other Ambulatory Visit: Payer: Self-pay

## 2019-07-16 ENCOUNTER — Inpatient Hospital Stay: Payer: Managed Care, Other (non HMO) | Attending: Oncology | Admitting: Oncology

## 2019-07-16 VITALS — BP 149/75 | HR 75 | Temp 98.2°F | Resp 17 | Ht 73.0 in | Wt 196.9 lb

## 2019-07-16 DIAGNOSIS — C775 Secondary and unspecified malignant neoplasm of intrapelvic lymph nodes: Secondary | ICD-10-CM | POA: Diagnosis not present

## 2019-07-16 DIAGNOSIS — I700 Atherosclerosis of aorta: Secondary | ICD-10-CM | POA: Diagnosis not present

## 2019-07-16 DIAGNOSIS — C679 Malignant neoplasm of bladder, unspecified: Secondary | ICD-10-CM

## 2019-07-16 DIAGNOSIS — R112 Nausea with vomiting, unspecified: Secondary | ICD-10-CM | POA: Diagnosis not present

## 2019-07-16 DIAGNOSIS — Z79899 Other long term (current) drug therapy: Secondary | ICD-10-CM | POA: Diagnosis not present

## 2019-07-16 DIAGNOSIS — Z5112 Encounter for antineoplastic immunotherapy: Secondary | ICD-10-CM | POA: Insufficient documentation

## 2019-07-16 DIAGNOSIS — Z95828 Presence of other vascular implants and grafts: Secondary | ICD-10-CM

## 2019-07-16 DIAGNOSIS — R1030 Lower abdominal pain, unspecified: Secondary | ICD-10-CM | POA: Insufficient documentation

## 2019-07-16 LAB — CBC WITH DIFFERENTIAL (CANCER CENTER ONLY)
Abs Immature Granulocytes: 0.01 10*3/uL (ref 0.00–0.07)
Basophils Absolute: 0 10*3/uL (ref 0.0–0.1)
Basophils Relative: 0 %
Eosinophils Absolute: 0.1 10*3/uL (ref 0.0–0.5)
Eosinophils Relative: 1 %
HCT: 41.7 % (ref 39.0–52.0)
Hemoglobin: 14.7 g/dL (ref 13.0–17.0)
Immature Granulocytes: 0 %
Lymphocytes Relative: 27 %
Lymphs Abs: 1.6 10*3/uL (ref 0.7–4.0)
MCH: 29.7 pg (ref 26.0–34.0)
MCHC: 35.3 g/dL (ref 30.0–36.0)
MCV: 84.2 fL (ref 80.0–100.0)
Monocytes Absolute: 0.6 10*3/uL (ref 0.1–1.0)
Monocytes Relative: 10 %
Neutro Abs: 3.6 10*3/uL (ref 1.7–7.7)
Neutrophils Relative %: 62 %
Platelet Count: 165 10*3/uL (ref 150–400)
RBC: 4.95 MIL/uL (ref 4.22–5.81)
RDW: 12.5 % (ref 11.5–15.5)
WBC Count: 5.8 10*3/uL (ref 4.0–10.5)
nRBC: 0 % (ref 0.0–0.2)

## 2019-07-16 LAB — CMP (CANCER CENTER ONLY)
ALT: 41 U/L (ref 0–44)
AST: 26 U/L (ref 15–41)
Albumin: 3.7 g/dL (ref 3.5–5.0)
Alkaline Phosphatase: 117 U/L (ref 38–126)
Anion gap: 9 (ref 5–15)
BUN: 14 mg/dL (ref 8–23)
CO2: 28 mmol/L (ref 22–32)
Calcium: 8.9 mg/dL (ref 8.9–10.3)
Chloride: 98 mmol/L (ref 98–111)
Creatinine: 1.24 mg/dL (ref 0.61–1.24)
GFR, Est AFR Am: >60 mL/min (ref >60)
GFR, Estimated: 60 mL/min — ABNORMAL LOW (ref >60)
Glucose, Bld: 449 mg/dL — ABNORMAL HIGH (ref 70–99)
Potassium: 4.5 mmol/L (ref 3.5–5.1)
Sodium: 135 mmol/L (ref 135–145)
Total Bilirubin: 0.3 mg/dL (ref 0.3–1.2)
Total Protein: 7.2 g/dL (ref 6.5–8.1)

## 2019-07-16 MED ORDER — SODIUM CHLORIDE 0.9% FLUSH
10.0000 mL | INTRAVENOUS | Status: DC | PRN
  Administered 2019-07-16: 10 mL
  Filled 2019-07-16: qty 10

## 2019-07-16 MED ORDER — HEPARIN SOD (PORK) LOCK FLUSH 100 UNIT/ML IV SOLN
500.0000 [IU] | Freq: Once | INTRAVENOUS | Status: AC | PRN
  Administered 2019-07-16: 500 [IU]
  Filled 2019-07-16: qty 5

## 2019-07-16 MED ORDER — SODIUM CHLORIDE 0.9 % IV SOLN
Freq: Once | INTRAVENOUS | Status: AC
  Administered 2019-07-16: 14:00:00 via INTRAVENOUS
  Filled 2019-07-16: qty 250

## 2019-07-16 MED ORDER — HEPARIN SOD (PORK) LOCK FLUSH 100 UNIT/ML IV SOLN
500.0000 [IU] | Freq: Once | INTRAVENOUS | Status: DC
  Filled 2019-07-16: qty 5

## 2019-07-16 MED ORDER — SODIUM CHLORIDE 0.9% FLUSH
10.0000 mL | Freq: Once | INTRAVENOUS | Status: AC
  Administered 2019-07-16: 10 mL
  Filled 2019-07-16: qty 10

## 2019-07-16 MED ORDER — SODIUM CHLORIDE 0.9 % IV SOLN
200.0000 mg | Freq: Once | INTRAVENOUS | Status: AC
  Administered 2019-07-16: 200 mg via INTRAVENOUS
  Filled 2019-07-16: qty 8

## 2019-07-16 NOTE — Patient Instructions (Signed)
[facility] Discharge Instructions for Patients Receiving Chemotherapy  Today you received the following chemotherapy agents: Keytruda.  To help prevent nausea and vomiting after your treatment, we encourage you to take your nausea medication as directed.   If you develop nausea and vomiting that is not controlled by your nausea medication, call the clinic.   BELOW ARE SYMPTOMS THAT SHOULD BE REPORTED IMMEDIATELY:  *FEVER GREATER THAN 100.5 F  *CHILLS WITH OR WITHOUT FEVER  NAUSEA AND VOMITING THAT IS NOT CONTROLLED WITH YOUR NAUSEA MEDICATION  *UNUSUAL SHORTNESS OF BREATH  *UNUSUAL BRUISING OR BLEEDING  TENDERNESS IN MOUTH AND THROAT WITH OR WITHOUT PRESENCE OF ULCERS  *URINARY PROBLEMS  *BOWEL PROBLEMS  UNUSUAL RASH Items with * indicate a potential emergency and should be followed up as soon as possible.  Feel free to call the clinic should you have any questions or concerns. The clinic phone number is [phone].  Please show the CHEMO ALERT CARD at check-in to the Emergency Department and triage nurse.  Pembrolizumab injection What is this medicine? PEMBROLIZUMAB (pem broe liz ue mab) is a monoclonal antibody. It is used to treat bladder cancer, cervical cancer, endometrial cancer, esophageal cancer, head and neck cancer, hepatocellular cancer, Hodgkin lymphoma, kidney cancer, lymphoma, melanoma, Merkel cell carcinoma, lung cancer, stomach cancer, urothelial cancer, and cancers that have a certain genetic condition. This medicine may be used for other purposes; ask your health care provider or pharmacist if you have questions. COMMON BRAND NAME(S): Keytruda What should I tell my health care provider before I take this medicine? They need to know if you have any of these conditions:  diabetes  immune system problems  inflammatory bowel disease  liver disease  lung or breathing disease  lupus  received or scheduled to receive an organ  transplant or a stem-cell transplant that uses donor stem cells  an unusual or allergic reaction to pembrolizumab, other medicines, foods, dyes, or preservatives  pregnant or trying to get pregnant  breast-feeding How should I use this medicine? This medicine is for infusion into a vein. It is given by a health care professional in a hospital or clinic setting. A special MedGuide will be given to you before each treatment. Be sure to read this information carefully each time. Talk to your pediatrician regarding the use of this medicine in children. While this drug may be prescribed for selected conditions, precautions do apply. Overdosage: If you think you have taken too much of this medicine contact a poison control center or emergency room at once. NOTE: This medicine is only for you. Do not share this medicine with others. What if I miss a dose? It is important not to miss your dose. Call your doctor or health care professional if you are unable to keep an appointment. What may interact with this medicine? Interactions have not been studied. Give your health care provider a list of all the medicines, herbs, non-prescription drugs, or dietary supplements you use. Also tell them if you smoke, drink alcohol, or use illegal drugs. Some items may interact with your medicine. This list may not describe all possible interactions. Give your health care provider a list of all the medicines, herbs, non-prescription drugs, or dietary supplements you use. Also tell them if you smoke, drink alcohol, or use illegal drugs. Some items may interact with your medicine. What should I watch for while using this medicine? Your condition will be monitored carefully while you are receiving this medicine. You may need   blood work done while you are taking this medicine. Do not become pregnant while taking this medicine or for 4 months after stopping it. Women should inform their doctor if they wish to become  pregnant or think they might be pregnant. There is a potential for serious side effects to an unborn child. Talk to your health care professional or pharmacist for more information. Do not breast-feed an infant while taking this medicine or for 4 months after the last dose. What side effects may I notice from receiving this medicine? Side effects that you should report to your doctor or health care professional as soon as possible:  allergic reactions like skin rash, itching or hives, swelling of the face, lips, or tongue  bloody or black, tarry  breathing problems  changes in vision  chest pain  chills  confusion  constipation  cough  diarrhea  dizziness or feeling faint or lightheaded  fast or irregular heartbeat  fever  flushing  hair loss  joint pain  low blood counts - this medicine may decrease the number of white blood cells, red blood cells and platelets. You may be at increased risk for infections and bleeding.  muscle pain  muscle weakness  persistent headache  redness, blistering, peeling or loosening of the skin, including inside the mouth  signs and symptoms of high blood sugar such as dizziness; dry mouth; dry skin; fruity breath; nausea; stomach pain; increased hunger or thirst; increased urination  signs and symptoms of kidney injury like trouble passing urine or change in the amount of urine  signs and symptoms of liver injury like dark urine, light-colored stools, loss of appetite, nausea, right upper belly pain, yellowing of the eyes or skin  sweating  swollen lymph nodes  weight loss Side effects that usually do not require medical attention (report to your doctor or health care professional if they continue or are bothersome):  decreased appetite  muscle pain  tiredness This list may not describe all possible side effects. Call your doctor for medical advice about side effects. You may report side effects to FDA at  1-800-FDA-1088. Where should I keep my medicine? This drug is given in a hospital or clinic and will not be stored at home. NOTE: This sheet is a summary. It may not cover all possible information. If you have questions about this medicine, talk to your doctor, pharmacist, or health care provider.  2020 Elsevier/Gold Standard (2018-09-15 13:46:58)  

## 2019-07-16 NOTE — Progress Notes (Signed)
Hematology and Oncology Follow Up Visit  Danny Wood [id number]:[id number] 10/13/1950 68 y.o. 07/16/2019 12:42 PM [name], [name], MD   Principle Diagnosis: 68 year old man with bladder cancer diagnosed in 2015.  He subsequently developed stage IV in 2019 with hepatic involvement.  5.    Prior Therapy:  He underwent a cystoscopy and a TURBT on 05/17/2014.   Neoadjuvant systemic chemotherapy in the form of gemcitabine and cisplatin cycle 1 day 1 is on 06/17/2014. He is S/P two cycles completed in 07/2014.  Therapy tolerated poorly with symptoms of nausea and vomiting and worsening renal function.  He is status post robotic cystoprostatectomy and bilateral lymphadenectomy completed on September 09, 2014.  The final pathology revealed T2N0 without any lymph node involvement.  He developed pelvic adenopathy that was biopsy-proven on Jan 14, 2018 to be metastatic urothelial carcinoma.  Carboplatin and gemcitabine cycle 1 started on 02/17/2018.  He completed 8 cycles of therapy in December 2019.   Current therapy: Pembrolizumab 200 mg every 3 weeks started on April 01, 2019.  He is here for cycle 6 of therapy.  Interim History: [name] returns today for a repeat evaluation.  Since the last visit, he reports no major changes in his health.  He continues to be active and attends to activities of daily living.  He denies any recent hospitalization or illnesses.  He denies any complications related to Pembrolizumab including does fatigue or tiredness.  He denies any skin rashes or lesions.  He denies any cough or shortness of breath.  He denies any changes in his bowel habits.   Patient denied any alteration mental status, neuropathy, confusion or dizziness.  Denies any headaches or lethargy.  Denies any night sweats, weight loss or changes in appetite.  Denied orthopnea, dyspnea on exertion or chest discomfort.  Denies shortness of breath, difficulty breathing hemoptysis or cough.   Denies any abdominal distention, nausea, early satiety or dyspepsia.  Denies any hematuria, frequency, dysuria or nocturia.  Denies any skin irritation, dryness or rash.  Denies any ecchymosis or petechiae.  Denies any lymphadenopathy or clotting.  Denies any heat or cold intolerance.  Denies any anxiety or depression.  Remaining review of system is negative.         Medications: Without any changes on review. Current Outpatient Medications  Medication Sig Dispense Refill  . lidocaine-prilocaine (EMLA) cream Apply 1 application topically as needed. 30 g 0  . ondansetron (ZOFRAN) 8 MG tablet Take 1 tablet (8 mg total) by mouth every 8 (eight) hours as needed for nausea or vomiting. 20 tablet 0  . oxyCODONE-acetaminophen (PERCOCET) 5-325 MG tablet Take 1-2 tablets by mouth every 6 (six) hours as needed for moderate pain or severe pain. 90 tablet 0   No current facility-administered medications for this visit.    Facility-Administered Medications Ordered in Other Visits  Medication Dose Route Frequency Provider Last Rate Last Dose  . heparin lock flush 100 unit/mL  500 Units Intracatheter Once Tate Zagal, Mathis Dad, MD         Allergies: No Known Allergies  Past Medical History, Surgical history, Social history, and Family History without any changes on update.Marland Kitchen   Physical Exam:    Blood pressure (!) 149/75, pulse 75, temperature 98.2 F (36.8 C), temperature source Temporal, resp. rate 17, height 6\' 1"  (1.854 m), weight 196 lb 14.4 oz (89.3 kg), SpO2 98 %.     ECOG: 0     General appearance: Alert, awake without any distress. Head:  Atraumatic without abnormalities Oropharynx: Without any thrush or ulcers. Eyes: No scleral icterus. Lymph nodes: No lymphadenopathy noted in the cervical, supraclavicular, or axillary nodes Heart:regular rate and rhythm, without any murmurs or gallops.   Lung: Clear to auscultation without any rhonchi, wheezes or dullness to percussion. Abdomin:  Soft, nontender without any shifting dullness or ascites. Musculoskeletal: No clubbing or cyanosis. Neurological: No motor or sensory deficits. Skin: No rashes or lesions. Psychiatric: Mood and affect appeared normal.       .   Lab Results: Lab Results  Component Value Date   WBC 6.0 06/25/2019   HGB 13.9 06/25/2019   HCT 38.6 (L) 06/25/2019   MCV 84.5 06/25/2019   PLT 206 06/25/2019     Chemistry      Component Value Date/Time   NA 136 06/25/2019 1355   NA 139 08/05/2014 1335   K 4.0 06/25/2019 1355   K 4.3 08/05/2014 1335   CL 100 06/25/2019 1355   CO2 28 06/25/2019 1355   CO2 26 08/05/2014 1335   BUN 18 06/25/2019 1355   BUN 18.2 08/05/2014 1335   CREATININE 1.15 06/25/2019 1355   CREATININE 1.1 08/05/2014 1335      Component Value Date/Time   CALCIUM 9.0 06/25/2019 1355   CALCIUM 9.5 08/05/2014 1335   ALKPHOS 108 06/25/2019 1355   ALKPHOS 98 08/05/2014 1335   AST 27 06/25/2019 1355   AST 45 (H) 08/05/2014 1335   ALT 37 06/25/2019 1355   ALT 73 (H) 08/05/2014 1335   BILITOT 0.4 06/25/2019 1355   BILITOT 0.47 08/05/2014 1335       IMPRESSION: 1. Interval development of a 3.0 x 1.9 cm cavitary lesion in the right lung apex with nodular ground-glass attenuation in both lung apices. This is likely infectious/inflammatory and drug toxicity is not excluded. Neoplasm could have this appearance. Close attention on follow-up recommended. 2. Marked interval decrease in size of retroperitoneal and bilateral groin lymphadenopathy. The abnormal soft tissue seen in the right pelvis adjacent to the sigmoid colon on the previous study has resolved. 3. Status post cysto prostatectomy with right lower quadrant ileal diversion. 4. Aortic Atherosclerosis (ICD10-I70.0).    Impression and Plan:  68 year old man with:    1.  Bladder cancer diagnosed in 2015 and subsequently developed stage IV bladder in 2019.   He continues to tolerate pembrolizumab without  any major complications at this time.  Risks and benefits of continuing this therapy was outlined.  Potential autoimmune complications among others were reiterated.  Alternative treatment options including systemic chemotherapy versus active surveillance given his robust response.  At this time he is agreeable to continue with the same dose and schedule.  2.  IV access: Port-A-Cath has been used for chemotherapy without any complications.  3.  Immune therapy considerations: I continue to educate him about potential complications including pneumonitis, colitis thyroid disease.  4.  Right leg and groin pain: Unchanged and appears to be musculoskeletal in nature.  Metastatic disease to the leg or pelvis are always a possibility and we will continue to monitor closely.   5.  Prognosis: Therapy remains palliative at this time although aggressive measures are warranted given his excellent performance status.   6. Followup: In 3 weeks for the next cycle of therapy.  25  minutes was spent with the patient face-to-face today.  More than 50% of time was dedicated to discussing treatment options, complications related to therapy and addressing future plan of care.  [name], MD  11/13/202012:42 PM

## 2019-07-19 ENCOUNTER — Telehealth: Payer: Self-pay | Admitting: Oncology

## 2019-07-19 NOTE — Telephone Encounter (Signed)
Scheduled appt per 11/13 los.  Spoke with pt and he is aware of his appt dates and time.

## 2019-08-06 ENCOUNTER — Inpatient Hospital Stay: Payer: Managed Care, Other (non HMO)

## 2019-08-06 ENCOUNTER — Inpatient Hospital Stay (HOSPITAL_BASED_OUTPATIENT_CLINIC_OR_DEPARTMENT_OTHER): Payer: Managed Care, Other (non HMO) | Admitting: Oncology

## 2019-08-06 ENCOUNTER — Inpatient Hospital Stay: Payer: Managed Care, Other (non HMO) | Attending: Oncology

## 2019-08-06 ENCOUNTER — Other Ambulatory Visit: Payer: Self-pay

## 2019-08-06 VITALS — BP 144/79 | HR 86 | Temp 98.2°F | Resp 18 | Ht 73.0 in | Wt 199.2 lb

## 2019-08-06 DIAGNOSIS — E1165 Type 2 diabetes mellitus with hyperglycemia: Secondary | ICD-10-CM | POA: Insufficient documentation

## 2019-08-06 DIAGNOSIS — M79604 Pain in right leg: Secondary | ICD-10-CM | POA: Diagnosis not present

## 2019-08-06 DIAGNOSIS — C679 Malignant neoplasm of bladder, unspecified: Secondary | ICD-10-CM

## 2019-08-06 DIAGNOSIS — R591 Generalized enlarged lymph nodes: Secondary | ICD-10-CM | POA: Insufficient documentation

## 2019-08-06 DIAGNOSIS — M79651 Pain in right thigh: Secondary | ICD-10-CM | POA: Insufficient documentation

## 2019-08-06 DIAGNOSIS — M25551 Pain in right hip: Secondary | ICD-10-CM | POA: Diagnosis not present

## 2019-08-06 DIAGNOSIS — Z79899 Other long term (current) drug therapy: Secondary | ICD-10-CM | POA: Diagnosis not present

## 2019-08-06 DIAGNOSIS — R112 Nausea with vomiting, unspecified: Secondary | ICD-10-CM | POA: Diagnosis not present

## 2019-08-06 DIAGNOSIS — R1030 Lower abdominal pain, unspecified: Secondary | ICD-10-CM | POA: Diagnosis not present

## 2019-08-06 DIAGNOSIS — Z794 Long term (current) use of insulin: Secondary | ICD-10-CM | POA: Diagnosis not present

## 2019-08-06 DIAGNOSIS — R202 Paresthesia of skin: Secondary | ICD-10-CM | POA: Diagnosis not present

## 2019-08-06 DIAGNOSIS — Z95828 Presence of other vascular implants and grafts: Secondary | ICD-10-CM

## 2019-08-06 DIAGNOSIS — Z5112 Encounter for antineoplastic immunotherapy: Secondary | ICD-10-CM | POA: Insufficient documentation

## 2019-08-06 LAB — CBC WITH DIFFERENTIAL (CANCER CENTER ONLY)
Abs Immature Granulocytes: 0.01 10*3/uL (ref 0.00–0.07)
Basophils Absolute: 0 10*3/uL (ref 0.0–0.1)
Basophils Relative: 1 %
Eosinophils Absolute: 0.1 10*3/uL (ref 0.0–0.5)
Eosinophils Relative: 1 %
HCT: 42.6 % (ref 39.0–52.0)
Hemoglobin: 14.7 g/dL (ref 13.0–17.0)
Immature Granulocytes: 0 %
Lymphocytes Relative: 27 %
Lymphs Abs: 1.7 10*3/uL (ref 0.7–4.0)
MCH: 29.3 pg (ref 26.0–34.0)
MCHC: 34.5 g/dL (ref 30.0–36.0)
MCV: 85 fL (ref 80.0–100.0)
Monocytes Absolute: 0.5 10*3/uL (ref 0.1–1.0)
Monocytes Relative: 8 %
Neutro Abs: 3.8 10*3/uL (ref 1.7–7.7)
Neutrophils Relative %: 63 %
Platelet Count: 182 10*3/uL (ref 150–400)
RBC: 5.01 MIL/uL (ref 4.22–5.81)
RDW: 12.7 % (ref 11.5–15.5)
WBC Count: 6.1 10*3/uL (ref 4.0–10.5)
nRBC: 0 % (ref 0.0–0.2)

## 2019-08-06 LAB — CMP (CANCER CENTER ONLY)
ALT: 51 U/L — ABNORMAL HIGH (ref 0–44)
AST: 32 U/L (ref 15–41)
Albumin: 3.6 g/dL (ref 3.5–5.0)
Alkaline Phosphatase: 113 U/L (ref 38–126)
Anion gap: 7 (ref 5–15)
BUN: 20 mg/dL (ref 8–23)
CO2: 29 mmol/L (ref 22–32)
Calcium: 9 mg/dL (ref 8.9–10.3)
Chloride: 98 mmol/L (ref 98–111)
Creatinine: 1.36 mg/dL — ABNORMAL HIGH (ref 0.61–1.24)
GFR, Est AFR Am: >60 mL/min (ref >60)
GFR, Estimated: 53 mL/min — ABNORMAL LOW (ref >60)
Glucose, Bld: 473 mg/dL — ABNORMAL HIGH (ref 70–99)
Potassium: 4.3 mmol/L (ref 3.5–5.1)
Sodium: 134 mmol/L — ABNORMAL LOW (ref 135–145)
Total Bilirubin: 0.4 mg/dL (ref 0.3–1.2)
Total Protein: 7 g/dL (ref 6.5–8.1)

## 2019-08-06 MED ORDER — SODIUM CHLORIDE 0.9 % IV SOLN
Freq: Once | INTRAVENOUS | Status: AC
  Administered 2019-08-06: 14:00:00 via INTRAVENOUS
  Filled 2019-08-06: qty 250

## 2019-08-06 MED ORDER — SODIUM CHLORIDE 0.9 % IV SOLN
200.0000 mg | Freq: Once | INTRAVENOUS | Status: AC
  Administered 2019-08-06: 200 mg via INTRAVENOUS
  Filled 2019-08-06: qty 8

## 2019-08-06 MED ORDER — SODIUM CHLORIDE 0.9% FLUSH
10.0000 mL | Freq: Once | INTRAVENOUS | Status: AC
  Administered 2019-08-06: 10 mL
  Filled 2019-08-06: qty 10

## 2019-08-06 MED ORDER — SODIUM CHLORIDE 0.9% FLUSH
10.0000 mL | INTRAVENOUS | Status: DC | PRN
  Administered 2019-08-06: 10 mL
  Filled 2019-08-06: qty 10

## 2019-08-06 MED ORDER — HEPARIN SOD (PORK) LOCK FLUSH 100 UNIT/ML IV SOLN
500.0000 [IU] | Freq: Once | INTRAVENOUS | Status: AC | PRN
  Administered 2019-08-06: 500 [IU]
  Filled 2019-08-06: qty 5

## 2019-08-06 NOTE — Progress Notes (Signed)
Pt glucose is 473 and a symptomatic, per pt "drank a rootbear on the way over here" Dr Alen Blew aware no new orders and ok to tx today   Pt provided with  past several [place] visits lab results and pt is planning on contacting PCP re: blood sugar elevation. Educated pt on s/s of hyperglycemia and pt verbalized understanding. Pt advised to seek help if s/s of hyperglycemia occur, pt verbalized understanding.

## 2019-08-06 NOTE — Progress Notes (Signed)
Hematology and Oncology Follow Up Visit  Danny Wood [id number] [birth year] 68 y.o. 08/06/2019 1:12 PM [name], [name], MD   Principle Diagnosis: 68 year old man with stage IV bladder cancer with lymphadenopathy documented in 2019.  He was initially diagnosed in 2015 with localized disease.  Prior Therapy:  He underwent a cystoscopy and a TURBT on 05/17/2014.   Neoadjuvant systemic chemotherapy in the form of gemcitabine and cisplatin cycle 1 day 1 is on 06/17/2014. He is S/P two cycles completed in 07/2014.  Therapy tolerated poorly with symptoms of nausea and vomiting and worsening renal function.  He is status post robotic cystoprostatectomy and bilateral lymphadenectomy completed on September 09, 2014.  The final pathology revealed T2N0 without any lymph node involvement.  He developed pelvic adenopathy that was biopsy-proven on Jan 14, 2018 to be metastatic urothelial carcinoma.  Carboplatin and gemcitabine cycle 1 started on 02/17/2018.  He completed 8 cycles of therapy in December 2019.   Current therapy: Pembrolizumab 200 mg every 3 weeks started on April 01, 2019.  He is here for cycle 7 of therapy.  Interim History: Mr. Pierpoint presents today for a follow-up.  Since the last visit, he reports no major changes in his health.  He continues to have issues related to his right groin and thigh of unclear etiology.  He does not report any penile discharge or abdominal pain.  He denies any changes in his bowel habits.  He denies any complications related to Pembrolizumab.  His performance status and quality remained unchanged.  He denied headaches, blurry vision, syncope or seizures.  Denies any fevers, chills or sweats.  Denied chest pain, palpitation, orthopnea or leg edema.  Denied cough, wheezing or hemoptysis.  Denied nausea, vomiting or abdominal pain.  Denies any constipation or diarrhea.  Denies any frequency urgency or hesitancy.  Denies any arthralgias or  myalgias.  Denies any skin rashes or lesions.  Denies any bleeding or clotting tendency.  Denies any easy bruising.  Denies any hair or nail changes.  Denies any anxiety or depression.  Remaining review of system is negative.            Medications: Without any changes on review. Current Outpatient Medications  Medication Sig Dispense Refill  . lidocaine-prilocaine (EMLA) cream Apply 1 application topically as needed. 30 g 0  . ondansetron (ZOFRAN) 8 MG tablet Take 1 tablet (8 mg total) by mouth every 8 (eight) hours as needed for nausea or vomiting. 20 tablet 0  . oxyCODONE-acetaminophen (PERCOCET) 5-325 MG tablet Take 1-2 tablets by mouth every 6 (six) hours as needed for moderate pain or severe pain. 90 tablet 0   No current facility-administered medications for this visit.      Allergies: No Known Allergies  Past Medical History, Surgical history, Social history, and Family History unchanged on review.   Physical Exam:    Blood pressure (!) 144/79, pulse 86, temperature 98.2 F (36.8 C), temperature source Temporal, resp. rate 18, height 6\' 1"  (1.854 m), weight 199 lb 3.2 oz (90.4 kg), SpO2 97 %.      ECOG: 0   General appearance: Comfortable appearing without any discomfort Head: Normocephalic without any trauma Oropharynx: Mucous membranes are moist and pink without any thrush or ulcers. Eyes: Pupils are equal and round reactive to light. Lymph nodes: No cervical, supraclavicular, inguinal or axillary lymphadenopathy.   Heart:regular rate and rhythm.  S1 and S2 without leg edema. Lung: Clear without any rhonchi or wheezes.  No dullness to  percussion. Abdomin: Soft, nontender, nondistended with good bowel sounds.  No hepatosplenomegaly. GU exam: No penile lesions.  Normal testicular exam.  Some fullness related in his right groin. Musculoskeletal: Swelling noted in his right thigh tender to touch.  No erythema or induration noted on the skin. Neurological: No  deficits noted on motor, sensory and deep tendon reflex exam. Skin: No petechial rash or dryness.  Appeared moist.         .   Lab Results: Lab Results  Component Value Date   WBC 5.8 07/16/2019   HGB 14.7 07/16/2019   HCT 41.7 07/16/2019   MCV 84.2 07/16/2019   PLT 165 07/16/2019     Chemistry      Component Value Date/Time   NA 135 07/16/2019 1238   NA 139 08/05/2014 1335   K 4.5 07/16/2019 1238   K 4.3 08/05/2014 1335   CL 98 07/16/2019 1238   CO2 28 07/16/2019 1238   CO2 26 08/05/2014 1335   BUN 14 07/16/2019 1238   BUN 18.2 08/05/2014 1335   CREATININE 1.24 07/16/2019 1238   CREATININE 1.1 08/05/2014 1335      Component Value Date/Time   CALCIUM 8.9 07/16/2019 1238   CALCIUM 9.5 08/05/2014 1335   ALKPHOS 117 07/16/2019 1238   ALKPHOS 98 08/05/2014 1335   AST 26 07/16/2019 1238   AST 45 (H) 08/05/2014 1335   ALT 41 07/16/2019 1238   ALT 73 (H) 08/05/2014 1335   BILITOT 0.3 07/16/2019 1238   BILITOT 0.47 08/05/2014 1335       Impression and Plan:  68 year old man with:    1.  Stage IV bladder cancer diagnosed in 2019 with pelvic adenopathy and hepatic metastasis after initially diagnosed with localized disease in 2015.    He has tolerated Pembrolizumab without any major complications with reasonable radiographic response based on imaging studies completed in October 2020.  Risks and benefits of continuing this therapy versus switching to different options were reviewed.  Given his reasonable tolerance plan is to continue with the same dose and schedule and repeat imaging studies in January 2021.   2.  IV access: Infusion via Port-A-Cath continues without any issues.  3.  Immune therapy considerations: He has not reported any specific complications related to that.  Issues such as pneumonitis, colitis, thyroid disease among others were reiterated.  4.  Right leg and groin pain: Remains of unclear etiology.  His CT scan in October did include the  lower pelvic area and no abnormalities noted.  I will obtain imaging studies of his right thigh to fully evaluate the etiology.   5.  Prognosis: His disease remains palliative although aggressive measures are warranted given his reasonable performance status.  His disease remains incurable.   6. Followup: He will return in 3 weeks for the subsequent cycles of therapy.  25  minutes was spent with the patient face-to-face today.  More than 50% of time was spent on updating his disease status, reviewing laboratory data, discussing treatment options and future plan of care.  [name], MD 12/4/20201:12 PM

## 2019-08-06 NOTE — Patient Instructions (Addendum)
[facility] Discharge Instructions for Patients Receiving Chemotherapy  Today you received the following chemotherapy agents Keytruda To help prevent nausea and vomiting after your treatment, we encourage you to take your nausea medication as directed.  If you develop nausea and vomiting that is not controlled by your nausea medication, call the clinic.   BELOW ARE SYMPTOMS THAT SHOULD BE REPORTED IMMEDIATELY:  *FEVER GREATER THAN 100.5 F  *CHILLS WITH OR WITHOUT FEVER  NAUSEA AND VOMITING THAT IS NOT CONTROLLED WITH YOUR NAUSEA MEDICATION  *UNUSUAL SHORTNESS OF BREATH  *UNUSUAL BRUISING OR BLEEDING  TENDERNESS IN MOUTH AND THROAT WITH OR WITHOUT PRESENCE OF ULCERS  *URINARY PROBLEMS  *BOWEL PROBLEMS  UNUSUAL RASH Items with * indicate a potential emergency and should be followed up as soon as possible.  Feel free to call the clinic should you have any questions or concerns. The clinic phone number is [phone].  Please show the [facility] at check-in to the Emergency Department and triage nurse.   Pembrolizumab injection What is this medicine? PEMBROLIZUMAB (pem broe liz ue mab) is a monoclonal antibody. It is used to treat bladder cancer, cervical cancer, endometrial cancer, esophageal cancer, head and neck cancer, hepatocellular cancer, Hodgkin lymphoma, kidney cancer, lymphoma, melanoma, Merkel cell carcinoma, lung cancer, stomach cancer, urothelial cancer, and cancers that have a certain genetic condition. This medicine may be used for other purposes; ask your health care provider or pharmacist if you have questions. COMMON BRAND NAME(S): Keytruda What should I tell my health care provider before I take this medicine? They need to know if you have any of these conditions:  diabetes  immune system problems  inflammatory bowel disease  liver disease  lung or breathing disease  lupus  received or scheduled to receive an organ transplant  or a stem-cell transplant that uses donor stem cells  an unusual or allergic reaction to pembrolizumab, other medicines, foods, dyes, or preservatives  pregnant or trying to get pregnant  breast-feeding How should I use this medicine? This medicine is for infusion into a vein. It is given by a health care professional in a hospital or clinic setting. A special MedGuide will be given to you before each treatment. Be sure to read this information carefully each time. Talk to your pediatrician regarding the use of this medicine in children. While this drug may be prescribed for selected conditions, precautions do apply. Overdosage: If you think you have taken too much of this medicine contact a poison control center or emergency room at once. NOTE: This medicine is only for you. Do not share this medicine with others. What if I miss a dose? It is important not to miss your dose. Call your doctor or health care professional if you are unable to keep an appointment. What may interact with this medicine? Interactions have not been studied. Give your health care provider a list of all the medicines, herbs, non-prescription drugs, or dietary supplements you use. Also tell them if you smoke, drink alcohol, or use illegal drugs. Some items may interact with your medicine. This list may not describe all possible interactions. Give your health care provider a list of all the medicines, herbs, non-prescription drugs, or dietary supplements you use. Also tell them if you smoke, drink alcohol, or use illegal drugs. Some items may interact with your medicine. What should I watch for while using this medicine? Your condition will be monitored carefully while you are receiving this medicine. You may need blood  work done while you are taking this medicine. Do not become pregnant while taking this medicine or for 4 months after stopping it. Women should inform their doctor if they wish to become pregnant or think  they might be pregnant. There is a potential for serious side effects to an unborn child. Talk to your health care professional or pharmacist for more information. Do not breast-feed an infant while taking this medicine or for 4 months after the last dose. What side effects may I notice from receiving this medicine? Side effects that you should report to your doctor or health care professional as soon as possible:  allergic reactions like skin rash, itching or hives, swelling of the face, lips, or tongue  bloody or black, tarry  breathing problems  changes in vision  chest pain  chills  confusion  constipation  cough  diarrhea  dizziness or feeling faint or lightheaded  fast or irregular heartbeat  fever  flushing  hair loss  joint pain  low blood counts - this medicine may decrease the number of white blood cells, red blood cells and platelets. You may be at increased risk for infections and bleeding.  muscle pain  muscle weakness  persistent headache  redness, blistering, peeling or loosening of the skin, including inside the mouth  signs and symptoms of high blood sugar such as dizziness; dry mouth; dry skin; fruity breath; nausea; stomach pain; increased hunger or thirst; increased urination  signs and symptoms of kidney injury like trouble passing urine or change in the amount of urine  signs and symptoms of liver injury like dark urine, light-colored stools, loss of appetite, nausea, right upper belly pain, yellowing of the eyes or skin  sweating  swollen lymph nodes  weight loss Side effects that usually do not require medical attention (report to your doctor or health care professional if they continue or are bothersome):  decreased appetite  muscle pain  tiredness This list may not describe all possible side effects. Call your doctor for medical advice about side effects. You may report side effects to FDA at 1-800-FDA-1088. Where should I  keep my medicine? This drug is given in a hospital or clinic and will not be stored at home. NOTE: This sheet is a summary. It may not cover all possible information. If you have questions about this medicine, talk to your doctor, pharmacist, or health care provider.  2020 Elsevier/Gold Standard (2018-09-15 13:46:58)    Hyperglycemia Hyperglycemia is when the sugar (glucose) level in your blood is too high. It may not cause symptoms. If you do have symptoms, they may include warning signs, such as:  Feeling more thirsty than normal.  Hunger.  Feeling tired.  Needing to pee (urinate) more than normal.  Blurry eyesight (vision). You may get other symptoms as it gets worse, such as:  Dry mouth.  Not being hungry (loss of appetite).  Fruity-smelling breath.  Weakness.  Weight gain or loss that is not planned. Weight loss may be fast.  A tingling or numb feeling in your hands or feet.  Headache.  Skin that does not bounce back quickly when it is lightly pinched and released (poor skin turgor).  Pain in your belly (abdomen).  Cuts or bruises that heal slowly. High blood sugar can happen to people who do or do not have diabetes. High blood sugar can happen slowly or quickly, and it can be an emergency. Follow these instructions at home: General instructions  Take over-the-counter and prescription medicines  only as told by your doctor.  Do not use products that contain nicotine or tobacco, such as cigarettes and e-cigarettes. If you need help quitting, ask your doctor.  Limit alcohol intake to no more than 1 drink per day for nonpregnant women and 2 drinks per day for men. One drink equals 12 oz of beer, 5 oz of wine, or 1 oz of hard liquor.  Manage stress. If you need help with this, ask your doctor.  Keep all follow-up visits as told by your doctor. This is important. Eating and drinking   Stay at a healthy weight.  Exercise regularly, as told by your  doctor.  Drink enough fluid, especially when you: ? Exercise. ? Get sick. ? Are in hot temperatures.  Eat healthy foods, such as: ? Low-fat (lean) proteins. ? Complex carbs (complex carbohydrates), such as whole wheat bread or brown rice. ? Fresh fruits and vegetables. ? Low-fat dairy products. ? Healthy fats.  Drink enough fluid to keep your pee (urine) clear or pale yellow. If you have diabetes:   Make sure you know the symptoms of hyperglycemia.  Follow your diabetes management plan, as told by your doctor. Make sure you: ? Take insulin and medicines as told. ? Follow your exercise plan. ? Follow your meal plan. Eat on time. Do not skip meals. ? Check your blood sugar as often as told. Make sure to check before and after exercise. If you exercise longer or in a different way than you normally do, check your blood sugar more often. ? Follow your sick day plan whenever you cannot eat or drink normally. Make this plan ahead of time with your doctor.  Share your diabetes management plan with people in your workplace, school, and household.  Check your urine for ketones when you are ill and as told by your doctor.  Carry a card or wear jewelry that says that you have diabetes. Contact a doctor if:  Your blood sugar level is higher than 240 mg/dL (13.3 mmol/L) for 2 days in a row.  You have problems keeping your blood sugar in your target range.  High blood sugar happens often for you. Get help right away if:  You have trouble breathing.  You have a change in how you think, feel, or act (mental status).  You feel sick to your stomach (nauseous), and that feeling does not go away.  You cannot stop throwing up (vomiting). These symptoms may be an emergency. Do not wait to see if the symptoms will go away. Get medical help right away. Call your local emergency services (911 in the [place] Do not drive yourself to the hospital. Summary  Hyperglycemia is when the sugar  (glucose) level in your blood is too high.  High blood sugar can happen to people who do or do not have diabetes.  Make sure you drink enough fluids, eat healthy foods, and exercise regularly.  Contact your doctor if you have problems keeping your blood sugar in your target range. This information is not intended to replace advice given to you by your health care provider. Make sure you discuss any questions you have with your health care provider. Document Released: 06/16/2009 Document Revised: 05/06/2016 Document Reviewed: 05/06/2016 Elsevier Patient Education  2020 [employer].

## 2019-08-09 ENCOUNTER — Telehealth: Payer: Self-pay | Admitting: Oncology

## 2019-08-09 LAB — TSH: TSH: 0.967 u[IU]/mL (ref 0.320–4.118)

## 2019-08-09 NOTE — Telephone Encounter (Signed)
Scheduled appt per 12/4 los.

## 2019-08-11 ENCOUNTER — Other Ambulatory Visit: Payer: Self-pay | Admitting: Oncology

## 2019-08-11 DIAGNOSIS — M79604 Pain in right leg: Secondary | ICD-10-CM

## 2019-08-11 DIAGNOSIS — C679 Malignant neoplasm of bladder, unspecified: Secondary | ICD-10-CM

## 2019-08-13 LAB — HEMOGLOBIN A1C: Hemoglobin A1C: 10.8

## 2019-08-16 ENCOUNTER — Telehealth: Payer: Self-pay

## 2019-08-16 ENCOUNTER — Ambulatory Visit (HOSPITAL_COMMUNITY): Admission: RE | Admit: 2019-08-16 | Payer: Managed Care, Other (non HMO) | Source: Ambulatory Visit

## 2019-08-16 NOTE — Telephone Encounter (Signed)
-----   Message from Wyatt Portela, MD sent at 08/16/2019 11:07 AM EST ----- Contact: [phone] He needs to continue to take insulin as prescribed by his primary care physician.  I have no objections of doing so.  I will discuss whether we should continue with Keytruda further in the next visit.  Thanks. ----- Message ----- From: Tami Lin, RN Sent: 08/16/2019  10:37 AM EST To: Wyatt Portela, MD  Patient called and stated he saw Dr. [name] on Friday. Per patient Dr. [name] stated he thinks patient's increased blood sugars are being caused by [facility]. Patient's next visit with you is 08/31/19. Patient stated he has enough insulin prescribed by Dr. [name] to last until 12/29. He is taking 10 units of regular insulin daily and is supposed to increase to 13 units daily starting tonight. Patient wants to know if he should wait to discuss when he follows up with on 12/29 or if you need to call him to discuss. Patient said Dr. [name] told him to reach out to you so he is not sure if Dr. [name] is going to contact you.  Lanelle Bal

## 2019-08-16 NOTE — Telephone Encounter (Signed)
Called patient and made him aware of Dr. Hazeline Junker instructions. Patient verbalized understanding.

## 2019-08-19 ENCOUNTER — Other Ambulatory Visit: Payer: Self-pay | Admitting: Oncology

## 2019-08-19 DIAGNOSIS — C670 Malignant neoplasm of trigone of bladder: Secondary | ICD-10-CM

## 2019-08-19 MED ORDER — OXYCODONE-ACETAMINOPHEN 5-325 MG PO TABS: 1.0000 | ORAL_TABLET | Freq: Four times a day (QID) | ORAL | 0 refills | Status: DC | PRN

## 2019-08-20 ENCOUNTER — Ambulatory Visit (HOSPITAL_COMMUNITY)
Admission: RE | Admit: 2019-08-20 | Discharge: 2019-08-20 | Disposition: A | Discharge status: Home / Self Care | Payer: Managed Care, Other (non HMO) | Source: Ambulatory Visit | Attending: Oncology | Admitting: Oncology

## 2019-08-20 ENCOUNTER — Other Ambulatory Visit (HOSPITAL_COMMUNITY): Payer: Managed Care, Other (non HMO)

## 2019-08-20 ENCOUNTER — Other Ambulatory Visit: Payer: Self-pay

## 2019-08-20 DIAGNOSIS — M25551 Pain in right hip: Secondary | ICD-10-CM

## 2019-08-20 DIAGNOSIS — C679 Malignant neoplasm of bladder, unspecified: Secondary | ICD-10-CM

## 2019-08-20 MED ORDER — IOHEXOL 300 MG/ML  SOLN
100.0000 mL | Freq: Once | INTRAMUSCULAR | Status: AC | PRN
  Administered 2019-08-20: 100 mL via INTRAVENOUS

## 2019-08-20 MED ORDER — SODIUM CHLORIDE (PF) 0.9 % IJ SOLN
INTRAMUSCULAR | Status: AC
  Filled 2019-08-20: qty 50

## 2019-08-20 MED ORDER — HEPARIN SOD (PORK) LOCK FLUSH 100 UNIT/ML IV SOLN: 500.0000 [IU] | Freq: Once | INTRAVENOUS | Status: DC

## 2019-08-20 MED ORDER — HEPARIN SOD (PORK) LOCK FLUSH 100 UNIT/ML IV SOLN
INTRAVENOUS | Status: AC
  Administered 2019-08-20: 500 [IU]
  Filled 2019-08-20: qty 5

## 2019-08-31 ENCOUNTER — Inpatient Hospital Stay: Payer: Managed Care, Other (non HMO)

## 2019-08-31 ENCOUNTER — Other Ambulatory Visit: Payer: Self-pay

## 2019-08-31 ENCOUNTER — Inpatient Hospital Stay (HOSPITAL_BASED_OUTPATIENT_CLINIC_OR_DEPARTMENT_OTHER): Payer: Managed Care, Other (non HMO) | Admitting: Oncology

## 2019-08-31 VITALS — BP 162/82 | HR 70 | Temp 97.8°F | Resp 18 | Ht 73.0 in | Wt 199.4 lb

## 2019-08-31 DIAGNOSIS — Z5112 Encounter for antineoplastic immunotherapy: Secondary | ICD-10-CM | POA: Diagnosis not present

## 2019-08-31 DIAGNOSIS — C679 Malignant neoplasm of bladder, unspecified: Secondary | ICD-10-CM

## 2019-08-31 DIAGNOSIS — Z95828 Presence of other vascular implants and grafts: Secondary | ICD-10-CM | POA: Diagnosis not present

## 2019-08-31 LAB — CBC WITH DIFFERENTIAL (CANCER CENTER ONLY)
Abs Immature Granulocytes: 0.01 10*3/uL (ref 0.00–0.07)
Basophils Absolute: 0 10*3/uL (ref 0.0–0.1)
Basophils Relative: 0 %
Eosinophils Absolute: 0.1 10*3/uL (ref 0.0–0.5)
Eosinophils Relative: 1 %
HCT: 45.4 % (ref 39.0–52.0)
Hemoglobin: 15.9 g/dL (ref 13.0–17.0)
Immature Granulocytes: 0 %
Lymphocytes Relative: 25 %
Lymphs Abs: 1.5 10*3/uL (ref 0.7–4.0)
MCH: 29.9 pg (ref 26.0–34.0)
MCHC: 35 g/dL (ref 30.0–36.0)
MCV: 85.5 fL (ref 80.0–100.0)
Monocytes Absolute: 0.6 10*3/uL (ref 0.1–1.0)
Monocytes Relative: 9 %
Neutro Abs: 3.7 10*3/uL (ref 1.7–7.7)
Neutrophils Relative %: 65 %
Platelet Count: 162 10*3/uL (ref 150–400)
RBC: 5.31 MIL/uL (ref 4.22–5.81)
RDW: 12.6 % (ref 11.5–15.5)
WBC Count: 5.8 10*3/uL (ref 4.0–10.5)
nRBC: 0 % (ref 0.0–0.2)

## 2019-08-31 LAB — CMP (CANCER CENTER ONLY)
ALT: 45 U/L — ABNORMAL HIGH (ref 0–44)
AST: 31 U/L (ref 15–41)
Albumin: 3.8 g/dL (ref 3.5–5.0)
Alkaline Phosphatase: 85 U/L (ref 38–126)
Anion gap: 9 (ref 5–15)
BUN: 24 mg/dL — ABNORMAL HIGH (ref 8–23)
CO2: 26 mmol/L (ref 22–32)
Calcium: 8.6 mg/dL — ABNORMAL LOW (ref 8.9–10.3)
Chloride: 101 mmol/L (ref 98–111)
Creatinine: 1.33 mg/dL — ABNORMAL HIGH (ref 0.61–1.24)
GFR, Est AFR Am: >60 mL/min (ref >60)
GFR, Estimated: 55 mL/min — ABNORMAL LOW (ref >60)
Glucose, Bld: 235 mg/dL — ABNORMAL HIGH (ref 70–99)
Potassium: 4.2 mmol/L (ref 3.5–5.1)
Sodium: 136 mmol/L (ref 135–145)
Total Bilirubin: 0.6 mg/dL (ref 0.3–1.2)
Total Protein: 7.3 g/dL (ref 6.5–8.1)

## 2019-08-31 MED ORDER — SODIUM CHLORIDE 0.9% FLUSH
10.0000 mL | Freq: Once | INTRAVENOUS | Status: AC
  Administered 2019-08-31: 10 mL
  Filled 2019-08-31: qty 10

## 2019-08-31 MED ORDER — HEPARIN SOD (PORK) LOCK FLUSH 100 UNIT/ML IV SOLN
500.0000 [IU] | Freq: Once | INTRAVENOUS | Status: AC
  Administered 2019-08-31: 500 [IU]
  Filled 2019-08-31: qty 5

## 2019-08-31 NOTE — Progress Notes (Signed)
Hematology and Oncology Follow Up Visit  Danny Wood [id number] 12-11-1950 68 y.o. 08/31/2019 9:32 AM Danny Wood Danny Wood, Danny Brow, MD   Principle Diagnosis: 68 year old man with with bladder cancer diagnosed in 2015 with localized disease.  He subsequently developed stage IV with lymphadenopathy.    Prior Therapy:  He underwent a cystoscopy and a TURBT on 05/17/2014.   Neoadjuvant systemic chemotherapy in the form of gemcitabine and cisplatin cycle 1 day 1 is on 06/17/2014. He is S/P two cycles completed in 07/2014.  Therapy tolerated poorly with symptoms of nausea and vomiting and worsening renal function.  He is status post robotic cystoprostatectomy and bilateral lymphadenectomy completed on September 09, 2014.  The final pathology revealed T2N0 without any lymph node involvement.  He developed pelvic adenopathy that was biopsy-proven on Jan 14, 2018 to be metastatic urothelial carcinoma.  Carboplatin and gemcitabine cycle 1 started on 02/17/2018.  He completed 8 cycles of therapy in December 2019.   Current therapy: Pembrolizumab 200 mg every 3 weeks started on April 01, 2019.  He is status post 7 cycles of therapy.  Interim History: Danny Wood returns today for a repeat evaluation.  Since the last visit, he continues to have issues with hyperglycemia and was evaluated by his primary care physician and was started on insulin.  He is taking insulin on a monthly basis with a blood sugar around the 200 range at this time.  He denies any other complications related to Pembrolizumab at this time.  He denies any nausea, vomiting or abdominal pain.  He denies any frequency urgency or hesitancy.  He denies any alteration mental status.  Continues to have issues with right thigh pain.   Patient denied any alteration mental status, neuropathy, confusion or dizziness.  Denies any headaches or lethargy.  Denies any night sweats, weight loss or changes in appetite.  Denied orthopnea,  dyspnea on exertion or chest discomfort.  Denies shortness of breath, difficulty breathing hemoptysis or cough.  Denies any abdominal distention, nausea, early satiety or dyspepsia.  Denies any hematuria, frequency, dysuria or nocturia.  Denies any skin irritation, dryness or rash.  Denies any ecchymosis or petechiae.  Denies any lymphadenopathy or clotting.  Denies any heat or cold intolerance.  Denies any anxiety or depression.  Remaining review of system is negative.                 Medications: Updated on review. Current Outpatient Medications  Medication Sig Dispense Refill  . lidocaine-prilocaine (EMLA) cream Apply 1 application topically as needed. 30 g 0  . ondansetron (ZOFRAN) 8 MG tablet Take 1 tablet (8 mg total) by mouth every 8 (eight) hours as needed for nausea or vomiting. 20 tablet 0  . oxyCODONE-acetaminophen (PERCOCET) 5-325 MG tablet Take 1-2 tablets by mouth every 6 (six) hours as needed for moderate pain or severe pain. 90 tablet 0   No current facility-administered medications for this visit.     Allergies: No Known Allergies  Past Medical History, Surgical history, Social history, and Family History reviewed without any changes.   Physical Exam:    Blood pressure (!) 162/82, pulse 70, temperature 97.8 F (36.6 C), temperature source Temporal, resp. rate 18, height 6\' 1"  (1.854 m), weight 199 lb 6.4 oz (90.4 kg), SpO2 100 %.      ECOG: 0    General appearance: Alert, awake without any distress. Head: Atraumatic without abnormalities Oropharynx: Without any thrush or ulcers. Eyes: No scleral icterus. Lymph nodes: No lymphadenopathy noted  in the cervical, supraclavicular, or axillary nodes Heart:regular rate and rhythm, without any murmurs or gallops.   Lung: Clear to auscultation without any rhonchi, wheezes or dullness to percussion. Abdomin: Soft, nontender without any shifting dullness or ascites. Musculoskeletal: No clubbing or  cyanosis. Neurological: No motor or sensory deficits. Skin: No rashes or lesions.        .   Lab Results: Lab Results  Component Value Date   WBC 5.8 08/31/2019   HGB 15.9 08/31/2019   HCT 45.4 08/31/2019   MCV 85.5 08/31/2019   PLT 162 08/31/2019     Chemistry      Component Value Date/Time   NA 136 08/31/2019 0852   NA 139 08/05/2014 1335   K 4.2 08/31/2019 0852   K 4.3 08/05/2014 1335   CL 101 08/31/2019 0852   CO2 26 08/31/2019 0852   CO2 26 08/05/2014 1335   BUN 24 (H) 08/31/2019 0852   BUN 18.2 08/05/2014 1335   CREATININE 1.33 (H) 08/31/2019 0852   CREATININE 1.1 08/05/2014 1335      Component Value Date/Time   CALCIUM 8.6 (L) 08/31/2019 0852   CALCIUM 9.5 08/05/2014 1335   ALKPHOS 85 08/31/2019 0852   ALKPHOS 98 08/05/2014 1335   AST 31 08/31/2019 0852   AST 45 (H) 08/05/2014 1335   ALT 45 (H) 08/31/2019 0852   ALT 73 (H) 08/05/2014 1335   BILITOT 0.6 08/31/2019 0852   BILITOT 0.47 08/05/2014 1335     Study Result  CLINICAL DATA:  Right inner thigh groin pain for 2-3 months with a constant tingling. No acute injury. History malignant neoplasm of the bladder.  EXAM: CT OF THE LOWER RIGHT EXTREMITY WITH CONTRAST  TECHNIQUE: Multidetector CT imaging of the right thigh was performed according to the standard protocol following intravenous contrast administration.  COMPARISON:  Pelvic CT 06/18/2019.  CONTRAST:  125mL OMNIPAQUE IOHEXOL 300 MG/ML  SOLN  FINDINGS: Bones/Joint/Cartilage  No evidence of acute fracture, dislocation or suspicious osseous lesions. There are stable small bone islands in the right femoral head. There are mild degenerative changes at the right sacroiliac joint and in the medial compartment of the knee. Medial meniscal chondrocalcinosis and a tibial intramedullary nail are noted. There is no significant knee or hip joint effusion.  Ligaments  Suboptimally assessed by CT. The ACL at the knee is not  well visualized.  Muscles and Tendons  The right thigh and visualized pelvic musculature appears normal. The extensor mechanism is intact at the knee.  Soft tissues  Mild subcutaneous edema anteriorly in the lower abdominal wall, similar to previous CT. Mildly prominent right inguinal nodes measuring up to 11 mm short axis on image 77/6 are stable. There are additional small right inguinal and right pelvic lymph nodes. A small right inguinal hernia containing only fat is stable. There is diffuse iliofemoral atherosclerosis without evidence of large vessel occlusion. Grossly stable postsurgical changes in the pelvis post cysto prostatectomy. Mild subcutaneous edema extends inferiorly into the medial aspect of the right thigh, similar to that seen in the visualized opposite thigh. No focal fluid collections or soft tissue masses are identified.  IMPRESSION: 1. No acute findings or explanation for the patient's symptoms. 2. Stable mildly prominent right external iliac and right inguinal lymph nodes. 3. Stable subcutaneous edema in the low anterior abdominal wall, extending into the medial aspect of the right thigh. No focal fluid collection. 4. Diffuse iliofemoral atherosclerosis     Impression and Plan:  68 year old man  with:    1.  Bladder cancer diagnosed in 21.  With documented stage IV disease including lymphadenopathy and hepatic involvement.   He has completed 7 cycles of Pembrolizumab without any major complications and excellent radiographic response to therapy.  He is developing worsening diabetes at this time with hyperglycemia that could be attributed to immunotherapy.  Risks and benefits of continuing this therapy was discussed.  At this time, given his excellent radiographic response I have recommended a treatment break until his blood sugar under reasonable control and then we will consider retreatment in the future.  I will repeat imaging studies in  January.  2.  IV access: Port-A-Cath remains in place without any issues.  3.  Immune therapy considerations: It is possible that he is developing hyperglycemia and diabetes related to immunotherapy.  Therapy will be on hold for the time being.  4.  Right leg and groin pain: CT scan obtained on 08/20/2019 was personally reviewed and discussed with the patient.  No clear-cut etiology noted at this time.  I recommended follow-up with orthopedics regarding this issue.   5.  Prognosis: Therapy remains palliative at this time although aggressive measures are warranted given his excellent performance status.  6.  Diabetes: He has been started on insulin with his primary care physician.  I recommended continue to follow-up with him regarding further management of his diabetes.   7. Followup: He will return in 3 weeks for a follow-up visit.  25  minutes was spent with the patient face-to-face today.  More than 50% of time was dedicated to reviewing his disease status, reviewing imaging studies and managing complications related to his current therapy.   [name], MD 12/29/20209:32 AM

## 2019-09-01 ENCOUNTER — Telehealth: Payer: Self-pay | Admitting: Oncology

## 2019-09-01 NOTE — Telephone Encounter (Signed)
No los per 12/29.

## 2019-09-14 ENCOUNTER — Ambulatory Visit (HOSPITAL_COMMUNITY): Admission: RE | Admit: 2019-09-14 | Payer: Managed Care, Other (non HMO) | Source: Ambulatory Visit

## 2019-09-17 ENCOUNTER — Encounter (HOSPITAL_COMMUNITY): Payer: Self-pay

## 2019-09-17 ENCOUNTER — Other Ambulatory Visit: Payer: Self-pay

## 2019-09-17 ENCOUNTER — Ambulatory Visit (HOSPITAL_COMMUNITY)
Admission: RE | Admit: 2019-09-17 | Discharge: 2019-09-17 | Disposition: A | Discharge status: Home / Self Care | Payer: Managed Care, Other (non HMO) | Source: Ambulatory Visit | Attending: Oncology | Admitting: Oncology

## 2019-09-17 DIAGNOSIS — C679 Malignant neoplasm of bladder, unspecified: Secondary | ICD-10-CM | POA: Diagnosis present

## 2019-09-17 MED ORDER — SODIUM CHLORIDE (PF) 0.9 % IJ SOLN
INTRAMUSCULAR | Status: AC
  Filled 2019-09-17: qty 50

## 2019-09-17 MED ORDER — SODIUM CHLORIDE 0.9 % IV SOLN
INTRAVENOUS | Status: AC
  Filled 2019-09-17: qty 250

## 2019-09-17 MED ORDER — IOHEXOL 300 MG/ML  SOLN
100.0000 mL | Freq: Once | INTRAMUSCULAR | Status: AC | PRN
  Administered 2019-09-17: 13:00:00 100 mL via INTRAVENOUS

## 2019-09-17 MED ORDER — HEPARIN SOD (PORK) LOCK FLUSH 100 UNIT/ML IV SOLN: 500.0000 [IU] | Freq: Once | INTRAVENOUS | Status: AC

## 2019-09-17 MED ORDER — HEPARIN SOD (PORK) LOCK FLUSH 100 UNIT/ML IV SOLN
INTRAVENOUS | Status: AC
  Administered 2019-09-17: 500 [IU] via INTRAVENOUS
  Filled 2019-09-17: qty 5

## 2019-09-21 ENCOUNTER — Telehealth: Payer: Self-pay | Admitting: Oncology

## 2019-09-21 NOTE — Telephone Encounter (Signed)
Returned patient's phone call regarding rescheduling an appointment, left a voicemail. 

## 2019-09-21 NOTE — Telephone Encounter (Signed)
Returned patient's phone call regarding cancelled 01/21 appointments, per patient's request appointment has been cancelled due to patient being out of town.

## 2019-09-23 ENCOUNTER — Inpatient Hospital Stay: Payer: Managed Care, Other (non HMO)

## 2019-09-23 ENCOUNTER — Inpatient Hospital Stay: Payer: Managed Care, Other (non HMO) | Admitting: Oncology

## 2019-10-08 ENCOUNTER — Other Ambulatory Visit: Payer: Self-pay

## 2019-10-12 ENCOUNTER — Other Ambulatory Visit: Payer: Self-pay

## 2019-10-12 ENCOUNTER — Ambulatory Visit: Payer: Managed Care, Other (non HMO) | Admitting: Endocrinology

## 2019-10-12 ENCOUNTER — Encounter: Payer: Self-pay | Admitting: Endocrinology

## 2019-10-12 VITALS — BP 150/80 | HR 83 | Ht 73.0 in | Wt 206.8 lb

## 2019-10-12 DIAGNOSIS — E119 Type 2 diabetes mellitus without complications: Secondary | ICD-10-CM

## 2019-10-12 DIAGNOSIS — E1121 Type 2 diabetes mellitus with diabetic nephropathy: Secondary | ICD-10-CM

## 2019-10-12 DIAGNOSIS — N1831 Chronic kidney disease, stage 3a: Secondary | ICD-10-CM | POA: Diagnosis not present

## 2019-10-12 DIAGNOSIS — E1021 Type 1 diabetes mellitus with diabetic nephropathy: Secondary | ICD-10-CM

## 2019-10-12 DIAGNOSIS — Z794 Long term (current) use of insulin: Secondary | ICD-10-CM

## 2019-10-12 DIAGNOSIS — E1122 Type 2 diabetes mellitus with diabetic chronic kidney disease: Secondary | ICD-10-CM

## 2019-10-12 LAB — POCT GLYCOSYLATED HEMOGLOBIN (HGB A1C): Hemoglobin A1C: 8.9 % — AB (ref 4.0–5.6)

## 2019-10-12 MED ORDER — INSULIN LISPRO (1 UNIT DIAL) 100 UNIT/ML (KWIKPEN): 2.0000 [IU] | PEN_INJECTOR | Freq: Two times a day (BID) | SUBCUTANEOUS | 11 refills | Status: DC

## 2019-10-12 MED ORDER — LANTUS SOLOSTAR 100 UNIT/ML ~~LOC~~ SOPN: 16.0000 [IU] | PEN_INJECTOR | SUBCUTANEOUS | 99 refills | Status: DC

## 2019-10-12 NOTE — Patient Instructions (Addendum)
Your blood pressure is high today.  Please see your primary care provider soon, to have it rechecked good diet and exercise significantly improve the control of your diabetes.  please let me know if you wish to be referred to a dietician.  high blood sugar is very risky to your health.  you should see an eye doctor and dentist every year.  It is very important to get all recommended vaccinations.  Controlling your blood pressure and cholesterol drastically reduces the damage diabetes does to your body.  Those who smoke should quit.  Please discuss these with your doctor.  check your blood sugar twice a day.  vary the time of day when you check, between before the 3 meals, and at bedtime.  also check if you have symptoms of your blood sugar being too high or too low.  please keep a record of the readings and bring it to your next appointment here (or you can bring the meter itself).  You can write it on any piece of paper.  please call us sooner if your blood sugar goes below 70, or if you have a lot of readings over 200.  Tomorrow morning, start Lantus 16 units each morning, and:  Take Humalog, 2 units twice a day (just before breakfast and supper).   Please come back for a follow-up appointment in 2 weeks, to consider an insulin pump.

## 2019-10-12 NOTE — Progress Notes (Signed)
Subjective:    Patient ID: Danny Wood, male    DOB: 10/13/1950, 69 y.o.   MRN: [id number]  HPI pt is referred by Dr [name], for diabetes.  Pt states DM was dx'ed in late 2020;  It is complicated by renal failure; he has been on insulin since dx; pt says his diet and exercise are good; he has never had pancreatic surgery, severe hypoglycemia or DKA.  He was noted at chemo to have glucose over 500.  He has been on [place] x 2 mos.  He now takes 16 units qd. I reviewed continuous glucose monitor data.  Glucose varies from 140-340.  It is in general lowest fasting and in the afternoon, but there is little trend throughout the day. He is out of tresiba (last dose last night), but he has plenty of Lantus Past Medical History:  Diagnosis Date  . At risk for sleep apnea    STOP-BANG= 4       SENT TO PCP 05-16-2014  . Bladder disorder   . Cancer [facility])    Bladder  . Hematuria   . History of renal cell carcinoma    2003  S/P  PARTIAL RIGHT NEPHRECTOMY  . Presence of surgical incision    lumbar diskectomy 05-11-2014-  bandage present  . Urgency of urination     Past Surgical History:  Procedure Laterality Date  . CYSTOSCOPY N/A 09/09/2014   Procedure: CYSTOSCOPY WITH INDOCYANINE GREEN DYE INJECTION AND URETHRAL DILATION;  Surgeon: Alexis Frock, MD;  Location: [facility];  Service: Urology;  Laterality: N/A;  . CYSTOSCOPY WITH BIOPSY N/A 05/17/2014   Procedure: CYSTO WITH BLADDER BIOPSY;  Surgeon: Malka So, MD;  Location: [place];  Service: Urology;  Laterality: N/A;  . EVALUATION UNDER ANESTHESIA WITH HEMORRHOIDECTOMY  07/07/2012   Procedure: EXAM UNDER ANESTHESIA WITH HEMORRHOIDECTOMY;  Surgeon: Joyice Faster. Cornett, MD;  Location: [place];  Service: General;  Laterality: N/A;  . IR IMAGING GUIDED PORT INSERTION  02/20/2018  . LUMBAR MICRODISCECTOMY  05-11-2014   L4 -- L5 (partial)  . PARTIAL NEPHRECTOMY Right 2003  . ROBOT ASSISTED LAPAROSCOPIC COMPLETE CYSTECT ILEAL  CONDUIT N/A 09/09/2014   Procedure: ROBOTIC ASSISTED LAPAROSCOPIC COMPLETE CYSTOPROSTATECTOMY,  ILEAL CONDUIT;  Surgeon: Alexis Frock, MD;  Location: [facility];  Service: Urology;  Laterality: N/A;    Social History   Socioeconomic History  . Marital status: Married    Spouse name: Not on file  . Number of children: Not on file  . Years of education: Not on file  . Highest education level: Not on file  Occupational History  . Not on file  Tobacco Use  . Smoking status: Former Smoker    Packs/day: 0.50    Years: 25.00    Pack years: 12.50    Types: Cigarettes    Quit date: 06/02/2004    Years since quitting: 15.3  . Smokeless tobacco: Never Used  Substance and Sexual Activity  . Alcohol use: Yes    Alcohol/week: 2.0 standard drinks    Types: 2 Standard drinks or equivalent per week  . Drug use: No  . Sexual activity: Not on file  Other Topics Concern  . Not on file  Social History Narrative  . Not on file   Social Determinants of Health   Financial Resource Strain:   . Difficulty of Paying Living Expenses: Not on file  Food Insecurity:   . Worried About Charity fundraiser in the Last Year: Not  on file  . Ran Out of Food in the Last Year: Not on file  Transportation Needs:   . Lack of Transportation (Medical): Not on file  . Lack of Transportation (Non-Medical): Not on file  Physical Activity:   . Days of Exercise per Week: Not on file  . Minutes of Exercise per Session: Not on file  Stress:   . Feeling of Stress : Not on file  Social Connections:   . Frequency of Communication with Friends and Family: Not on file  . Frequency of Social Gatherings with Friends and Family: Not on file  . Attends Religious Services: Not on file  . Active Member of Clubs or Organizations: Not on file  . Attends Archivist Meetings: Not on file  . Marital Status: Not on file  Intimate Partner Violence:   . Fear of Current or Ex-Partner: Not on file  . Emotionally Abused: Not  on file  . Physically Abused: Not on file  . Sexually Abused: Not on file    Current Outpatient Medications on File Prior to Visit  Medication Sig Dispense Refill  . oxyCODONE-acetaminophen (PERCOCET) 5-325 MG tablet Take 1-2 tablets by mouth every 6 (six) hours as needed for moderate pain or severe pain. 90 tablet 0   No current facility-administered medications on file prior to visit.    No Known Allergies  Family History  Problem Relation Age of Onset  . Diabetes Neg Hx     BP (!) 150/80 (BP Location: Left Arm, Patient Position: Sitting, Cuff Size: Normal)   Pulse 83   Ht 6\' 1"  (1.854 m)   Wt 206 lb 12.8 oz (93.8 kg)   SpO2 95%   BMI 27.28 kg/m    Review of Systems denies blurry vision, chest pain, sob, n/v, urinary frequency, excessive diaphoresis, memory loss, depression.  In 2020, he lost 5-10 lbs.  Since on insulin, he has regained that.  He has frequent urination.       Objective:   Physical Exam VS: see vs page GEN: no distress HEAD: head: no deformity eyes: no periorbital swelling, no proptosis external nose and ears are normal NECK: supple, thyroid is not enlarged CHEST WALL: no deformity LUNGS: clear to auscultation CV: reg rate and rhythm, no murmur ABD: urostomy is noted MUSCULOSKELETAL: muscle bulk and strength are grossly normal.  no obvious joint swelling.  gait is normal and steady EXTEMITIES: no deformity.  no ulcer on the feet.  feet are of normal color and temp.  no edema PULSES: dorsalis pedis intact bilat.  no carotid bruit NEURO:  cn 2-12 grossly intact.   readily moves all 4's.  sensation is intact to touch on the feet SKIN:  Normal texture and temperature.  No rash or suspicious lesion is visible.   NODES:  None palpable at the neck PSYCH: alert, well-oriented.  Does not appear anxious nor depressed.  Lab Results  Component Value Date   CREATININE 1.33 (H) 08/31/2019   BUN 24 (H) 08/31/2019   NA 136 08/31/2019   K 4.2 08/31/2019   CL  101 08/31/2019   CO2 26 08/31/2019   Lab Results  Component Value Date   HGBA1C 8.9 (A) 10/12/2019   I have reviewed outside records, and summarized: Pt was noted to have elevated a1c, and referred here.  It was noted that he had stage 4 bladder cancer      Assessment & Plan:  Type 1 DM, with renal failure, new to me Stage 4  bladder cancer: he is not a candidate for aggressive glycemic control HTN: is noted today  Patient Instructions  Your blood pressure is high today.  Please see your primary care provider soon, to have it rechecked good diet and exercise significantly improve the control of your diabetes.  please let me know if you wish to be referred to a dietician.  high blood sugar is very risky to your health.  you should see an eye doctor and dentist every year.  It is very important to get all recommended vaccinations.  Controlling your blood pressure and cholesterol drastically reduces the damage diabetes does to your body.  Those who smoke should quit.  Please discuss these with your doctor.  check your blood sugar twice a day.  vary the time of day when you check, between before the 3 meals, and at bedtime.  also check if you have symptoms of your blood sugar being too high or too low.  please keep a record of the readings and bring it to your next appointment here (or you can bring the meter itself).  You can write it on any piece of paper.  please call us sooner if your blood sugar goes below 70, or if you have a lot of readings over 200.  Tomorrow morning, start Lantus 16 units each morning, and:  Take Humalog, 2 units twice a day (just before breakfast and supper).   Please come back for a follow-up appointment in 2 weeks, to consider an insulin pump.

## 2019-10-13 DIAGNOSIS — E119 Type 2 diabetes mellitus without complications: Secondary | ICD-10-CM | POA: Insufficient documentation

## 2019-10-14 ENCOUNTER — Other Ambulatory Visit: Payer: Self-pay | Admitting: Oncology

## 2019-10-14 DIAGNOSIS — C670 Malignant neoplasm of trigone of bladder: Secondary | ICD-10-CM

## 2019-10-14 MED ORDER — OXYCODONE-ACETAMINOPHEN 5-325 MG PO TABS: 1.0000 | ORAL_TABLET | Freq: Four times a day (QID) | ORAL | 0 refills | Status: DC | PRN

## 2019-10-15 ENCOUNTER — Other Ambulatory Visit: Payer: Self-pay

## 2019-10-15 DIAGNOSIS — N1831 Chronic kidney disease, stage 3a: Secondary | ICD-10-CM

## 2019-10-15 DIAGNOSIS — Z794 Long term (current) use of insulin: Secondary | ICD-10-CM

## 2019-10-15 DIAGNOSIS — E1122 Type 2 diabetes mellitus with diabetic chronic kidney disease: Secondary | ICD-10-CM

## 2019-10-15 MED ORDER — INSULIN LISPRO (1 UNIT DIAL) 100 UNIT/ML (KWIKPEN): 2.0000 [IU] | PEN_INJECTOR | Freq: Two times a day (BID) | SUBCUTANEOUS | 2 refills | Status: DC

## 2019-10-18 ENCOUNTER — Other Ambulatory Visit: Payer: Self-pay

## 2019-10-18 MED ORDER — BASAGLAR KWIKPEN 100 UNIT/ML ~~LOC~~ SOPN: PEN_INJECTOR | SUBCUTANEOUS | 0 refills | Status: DC

## 2019-10-26 ENCOUNTER — Telehealth: Payer: Self-pay | Admitting: Endocrinology

## 2019-10-26 ENCOUNTER — Ambulatory Visit: Payer: Managed Care, Other (non HMO) | Admitting: Endocrinology

## 2019-10-26 ENCOUNTER — Ambulatory Visit: Payer: Managed Care, Other (non HMO) | Admitting: Nutrition

## 2019-10-27 NOTE — Telephone Encounter (Signed)
NA

## 2019-12-21 ENCOUNTER — Telehealth: Payer: Self-pay

## 2019-12-21 ENCOUNTER — Other Ambulatory Visit: Payer: Self-pay | Admitting: Oncology

## 2019-12-21 DIAGNOSIS — C670 Malignant neoplasm of trigone of bladder: Secondary | ICD-10-CM

## 2019-12-21 MED ORDER — OXYCODONE-ACETAMINOPHEN 5-325 MG PO TABS: 1.0000 | ORAL_TABLET | Freq: Four times a day (QID) | ORAL | 0 refills | Status: DC | PRN

## 2019-12-21 NOTE — Telephone Encounter (Signed)
Patient called requesting to reschedule missed appointments from January 2021 and requesting a refill on Oxycodone. Dr. Alen Blew made aware. Refill sent and scheduling message sent to schedule patient on 01/04/20 to see Dr. Alen Blew at 3:00 with lab and flush prior at 2:30. Patient verbalized understanding.

## 2019-12-22 ENCOUNTER — Telehealth: Payer: Self-pay | Admitting: Oncology

## 2019-12-22 NOTE — Telephone Encounter (Signed)
Scheduled appt per 4/20 sch message - pt is aware of appt

## 2020-01-04 ENCOUNTER — Inpatient Hospital Stay: Payer: Managed Care, Other (non HMO)

## 2020-01-04 ENCOUNTER — Other Ambulatory Visit: Payer: Self-pay

## 2020-01-04 ENCOUNTER — Inpatient Hospital Stay: Payer: Managed Care, Other (non HMO) | Attending: Oncology | Admitting: Oncology

## 2020-01-04 VITALS — BP 136/76 | HR 94 | Temp 98.3°F | Resp 17 | Ht 73.0 in | Wt 199.9 lb

## 2020-01-04 DIAGNOSIS — R1030 Lower abdominal pain, unspecified: Secondary | ICD-10-CM | POA: Insufficient documentation

## 2020-01-04 DIAGNOSIS — R112 Nausea with vomiting, unspecified: Secondary | ICD-10-CM | POA: Diagnosis not present

## 2020-01-04 DIAGNOSIS — M7989 Other specified soft tissue disorders: Secondary | ICD-10-CM | POA: Insufficient documentation

## 2020-01-04 DIAGNOSIS — R590 Localized enlarged lymph nodes: Secondary | ICD-10-CM | POA: Insufficient documentation

## 2020-01-04 DIAGNOSIS — C679 Malignant neoplasm of bladder, unspecified: Secondary | ICD-10-CM | POA: Insufficient documentation

## 2020-01-04 DIAGNOSIS — Z79899 Other long term (current) drug therapy: Secondary | ICD-10-CM | POA: Insufficient documentation

## 2020-01-04 DIAGNOSIS — R911 Solitary pulmonary nodule: Secondary | ICD-10-CM | POA: Insufficient documentation

## 2020-01-04 DIAGNOSIS — M79652 Pain in left thigh: Secondary | ICD-10-CM | POA: Diagnosis not present

## 2020-01-04 DIAGNOSIS — Z95828 Presence of other vascular implants and grafts: Secondary | ICD-10-CM

## 2020-01-04 DIAGNOSIS — I700 Atherosclerosis of aorta: Secondary | ICD-10-CM | POA: Diagnosis not present

## 2020-01-04 LAB — CBC WITH DIFFERENTIAL (CANCER CENTER ONLY)
Abs Immature Granulocytes: 0.01 10*3/uL (ref 0.00–0.07)
Basophils Absolute: 0 10*3/uL (ref 0.0–0.1)
Basophils Relative: 0 %
Eosinophils Absolute: 0.1 10*3/uL (ref 0.0–0.5)
Eosinophils Relative: 1 %
HCT: 47 % (ref 39.0–52.0)
Hemoglobin: 16.2 g/dL (ref 13.0–17.0)
Immature Granulocytes: 0 %
Lymphocytes Relative: 25 %
Lymphs Abs: 1.8 10*3/uL (ref 0.7–4.0)
MCH: 29.6 pg (ref 26.0–34.0)
MCHC: 34.5 g/dL (ref 30.0–36.0)
MCV: 85.8 fL (ref 80.0–100.0)
Monocytes Absolute: 0.8 10*3/uL (ref 0.1–1.0)
Monocytes Relative: 11 %
Neutro Abs: 4.3 10*3/uL (ref 1.7–7.7)
Neutrophils Relative %: 63 %
Platelet Count: 196 10*3/uL (ref 150–400)
RBC: 5.48 MIL/uL (ref 4.22–5.81)
RDW: 12.2 % (ref 11.5–15.5)
WBC Count: 7 10*3/uL (ref 4.0–10.5)
nRBC: 0 % (ref 0.0–0.2)

## 2020-01-04 LAB — CMP (CANCER CENTER ONLY)
ALT: 36 U/L (ref 0–44)
AST: 26 U/L (ref 15–41)
Albumin: 3.5 g/dL (ref 3.5–5.0)
Alkaline Phosphatase: 70 U/L (ref 38–126)
Anion gap: 8 (ref 5–15)
BUN: 19 mg/dL (ref 8–23)
CO2: 28 mmol/L (ref 22–32)
Calcium: 9 mg/dL (ref 8.9–10.3)
Chloride: 100 mmol/L (ref 98–111)
Creatinine: 1.3 mg/dL — ABNORMAL HIGH (ref 0.61–1.24)
GFR, Est AFR Am: >60 mL/min (ref >60)
GFR, Estimated: 56 mL/min — ABNORMAL LOW (ref >60)
Glucose, Bld: 262 mg/dL — ABNORMAL HIGH (ref 70–99)
Potassium: 4 mmol/L (ref 3.5–5.1)
Sodium: 136 mmol/L (ref 135–145)
Total Bilirubin: 0.6 mg/dL (ref 0.3–1.2)
Total Protein: 7 g/dL (ref 6.5–8.1)

## 2020-01-04 MED ORDER — HEPARIN SOD (PORK) LOCK FLUSH 100 UNIT/ML IV SOLN
500.0000 [IU] | Freq: Once | INTRAVENOUS | Status: AC
  Administered 2020-01-04: 500 [IU]
  Filled 2020-01-04: qty 5

## 2020-01-04 MED ORDER — SODIUM CHLORIDE 0.9% FLUSH
10.0000 mL | Freq: Once | INTRAVENOUS | Status: AC
  Administered 2020-01-04: 10 mL
  Filled 2020-01-04: qty 10

## 2020-01-04 NOTE — Progress Notes (Signed)
Hematology and Oncology Follow Up Visit  Danny Wood [id number] 10-Oct-1950 69 y.o. 01/04/2020 2:57 PM Danny Wood, Danny Brow, MD   Principle Diagnosis: 69 year old man with stage IV high-grade urothelial carcinoma of the bladder with pelvic adenopathy noted in 2019.  He presented with localized disease in 2015.    Prior Therapy:  He underwent a cystoscopy and a TURBT on 05/17/2014.   Neoadjuvant systemic chemotherapy in the form of gemcitabine and cisplatin cycle 1 day 1 is on 06/17/2014. He is S/P two cycles completed in 07/2014.  Therapy tolerated poorly with symptoms of nausea and vomiting and worsening renal function.  He is status post robotic cystoprostatectomy and bilateral lymphadenectomy completed on September 09, 2014.  The final pathology revealed T2N0 without any lymph node involvement.  He developed pelvic adenopathy that was biopsy-proven on Jan 14, 2018 to be metastatic urothelial carcinoma.  Carboplatin and gemcitabine cycle 1 started on 02/17/2018.  He completed 8 cycles of therapy in December 2019.  Pembrolizumab 200 mg every 3 weeks started on April 01, 2019.  He is status post 7 cycles of therapy in December 2020.  Therapy stopped because of of patient preference, excellent clinical response and treatment holiday.   Current therapy: Active surveillance  Interim History: Danny Wood presents today for a follow-up visit.  Since the last visit, he reports no major changes in his health.  He is no longer taking insulin for the time being with improvement in his blood sugar.  He continues to have issues with his left thigh swelling and pain although not dramatically different from previously.  As performance status and quality of life remains unchanged.                 Medications: Unchanged on review. Current Outpatient Medications  Medication Sig Dispense Refill  . Insulin Glargine (BASAGLAR KWIKPEN) 100 UNIT/ML SOPN Inject 16 Units into  the skin every morning. **Replaces Lantus.** 15 mL 0  . insulin lispro (HUMALOG KWIKPEN) 100 UNIT/ML KwikPen Inject 0.02 mLs (2 Units total) into the skin 2 (two) times daily with a meal. 1.2 mL 2  . oxyCODONE-acetaminophen (PERCOCET) 5-325 MG tablet Take 1-2 tablets by mouth every 6 (six) hours as needed for moderate pain or severe pain. 90 tablet 0   No current facility-administered medications for this visit.     Allergies: No Known Allergies    Physical Exam:     Blood pressure 136/76, pulse 94, temperature 98.3 F (36.8 C), temperature source Temporal, resp. rate 17, height 6\' 1"  (1.854 m), weight 199 lb 14.4 oz (90.7 kg), SpO2 99 %.      ECOG: 0   General appearance: Comfortable appearing without any discomfort Head: Normocephalic without any trauma Oropharynx: Mucous membranes are moist and pink without any thrush or ulcers. Eyes: Pupils are equal and round reactive to light. Lymph nodes: No cervical, supraclavicular, inguinal or axillary lymphadenopathy.   Heart:regular rate and rhythm.  S1 and S2 without leg edema. Lung: Clear without any rhonchi or wheezes.  No dullness to percussion. Abdomin: Soft, nontender, nondistended with good bowel sounds.  No hepatosplenomegaly. Musculoskeletal: Mild edema noted in his upper thigh with full range of motion in his hip flexors. Neurological: No deficits noted on motor, sensory and deep tendon reflex exam. Skin: No petechial rash or dryness.  Appeared moist.          .   Lab Results: Lab Results  Component Value Date   WBC 5.8 08/31/2019   HGB 15.9  08/31/2019   HCT 45.4 08/31/2019   MCV 85.5 08/31/2019   PLT 162 08/31/2019     Chemistry     IMPRESSION: 1. Interval decrease in size of right upper lobe cavitary lung lesion and surrounding ground-glass attenuating nodules. Favor inflammatory or infectious process. 2. New ground-glass nodule within the lateral right upper lobe measures 9 mm. Attention on  follow-up imaging is advised. 3. Status post Cysto prostatectomy with right lower quadrant ileal diversion. No complications identified. 4. Small retroperitoneal lymph nodes are similar to previous exam. No findings to suggest recurrent adenopathy within the abdomen or pelvis. 5. Borderline bilateral inguinal adenopathy is again noted, unchanged.  Aortic Atherosclerosis (ICD10-I70.0).  Impression and Plan:  69 year old man with:    1.  Stage IV bladder cancer with pelvic adenopathy diagnosed in 2019.   He status post therapy outlined above including 7 cycles of Pembrolizumab.  Last treatment given in December 2020 and a follow-up CT scan in January 2021 continues to show positive response to therapy.  Imaging studies were reviewed and discussed with the patient today.  Showed no evidence of malignancy and resolution of his pelvic adenopathy as well as chronic inflammatory nodules in his lung.  Management options moving forward were reviewed at this time.  This would include continued active surveillance versus restarting therapy with immunotherapy or different salvage options.  The plan is to repeat imaging studies in 2 months and repeat evaluation at that time.  2.  IV access: Port-A-Cath will continue to be flushed periodically.  3.  Immune therapy considerations: Delayed complications such as pneumonitis, colitis and thyroid disease were reviewed.  4.  Right leg and groin pain: Unchanged at this time.   5.  Prognosis: His disease is incurable although he is experiencing excellent benefit and aggressive measures are warranted.  6.  Diabetes: Blood sugar improved and is off insulin at this time.   7. Followup: In 2 months for repeat imaging studies.  30  minutes were dedicated to this visit. The time was spent on reviewing laboratory data, imaging studies, discussing treatment options,  and answering questions regarding future plan. Danny Button, MD 5/4/20212:57  PM

## 2020-01-04 NOTE — Addendum Note (Signed)
Addended by: Wyatt Portela on: 01/04/2020 03:41 PM   Modules accepted: Orders

## 2020-01-05 ENCOUNTER — Telehealth: Payer: Self-pay | Admitting: Oncology

## 2020-01-05 NOTE — Telephone Encounter (Signed)
Scheduled appt per 5/4 los.  Left a vm of the appt date and time. 

## 2020-01-24 ENCOUNTER — Telehealth: Payer: Self-pay

## 2020-01-24 NOTE — Telephone Encounter (Signed)
TC from Pt stating he has pain from his chest to his stomach, He stated he can't sleep because of the pain, and is doubling up on his pain medication. Pt asked why he didn't go to ER for chest pain, he stated he thinks its from his tumors. Dr. Alen Blew informed, Per Dr. Alen Blew Pt. scan should be moved up and visit after. Pt. CT scan scheduled for this Friday 01/28/20 and visit with Dr. Alen Blew on same day at 1pm. Pt. Verbalized understanding.

## 2020-01-28 ENCOUNTER — Other Ambulatory Visit: Payer: Self-pay

## 2020-01-28 ENCOUNTER — Encounter: Payer: Self-pay | Admitting: General Practice

## 2020-01-28 ENCOUNTER — Telehealth: Payer: Self-pay

## 2020-01-28 ENCOUNTER — Ambulatory Visit (HOSPITAL_COMMUNITY): Payer: Self-pay

## 2020-01-28 ENCOUNTER — Inpatient Hospital Stay (HOSPITAL_BASED_OUTPATIENT_CLINIC_OR_DEPARTMENT_OTHER): Payer: Managed Care, Other (non HMO) | Admitting: Oncology

## 2020-01-28 DIAGNOSIS — C679 Malignant neoplasm of bladder, unspecified: Secondary | ICD-10-CM | POA: Diagnosis not present

## 2020-01-28 DIAGNOSIS — C670 Malignant neoplasm of trigone of bladder: Secondary | ICD-10-CM

## 2020-01-28 MED ORDER — MORPHINE SULFATE ER 30 MG PO TBCR: 30.0000 mg | EXTENDED_RELEASE_TABLET | Freq: Two times a day (BID) | ORAL | 0 refills | Status: DC

## 2020-01-28 MED ORDER — OXYCODONE-ACETAMINOPHEN 5-325 MG PO TABS: 1.0000 | ORAL_TABLET | Freq: Four times a day (QID) | ORAL | 0 refills | Status: DC | PRN

## 2020-01-28 NOTE — Progress Notes (Signed)
DISCONTINUE ON PATHWAY REGIMEN - Bladder     A cycle is 21 days:     Pembrolizumab   **Always confirm dose/schedule in your pharmacy ordering system**  REASON: Disease Progression PRIOR TREATMENT: [id number]: Pembrolizumab 200 mg q21 Days Until Progression,  Unacceptable Toxicity, or up to 24 Months TREATMENT RESPONSE: Partial Response (PR)  START ON PATHWAY REGIMEN - Bladder     A cycle is every 28 days:     Enfortumab vedotin-ejfv   **Always confirm dose/schedule in your pharmacy ordering system**  Patient Characteristics: Advanced/Metastatic Disease, Third Line and Beyond Therapeutic Status: Advanced/Metastatic Disease Line of Therapy: Third Line and Beyond  Intent of Therapy: Non-Curative / Palliative Intent, Discussed with Patient

## 2020-01-28 NOTE — Progress Notes (Signed)
Hematology and Oncology Follow Up Visit  Danny Wood [id number] May 12, 1951 69 y.o. 01/28/2020 1:01 PM Danny Wood, Danny Brow, MD   Principle Diagnosis: 69 year old man with urothelial carcinoma of the renal pelvis and bladder diagnosed in 2015.  He has stage IV high-grade urothelial carcinoma and pelvic adenopathy.  Prior Therapy:  He underwent a cystoscopy and a TURBT on 05/17/2014.   Neoadjuvant systemic chemotherapy in the form of gemcitabine and cisplatin cycle 1 day 1 is on 06/17/2014. He is S/P two cycles completed in 07/2014.  Therapy tolerated poorly with symptoms of nausea and vomiting and worsening renal function.  He is status post robotic cystoprostatectomy and bilateral lymphadenectomy completed on September 09, 2014.  The final pathology revealed T2N0 without any lymph node involvement.  He developed pelvic adenopathy that was biopsy-proven on Jan 14, 2018 to be metastatic urothelial carcinoma.  Carboplatin and gemcitabine cycle 1 started on 02/17/2018.  He completed 8 cycles of therapy in December 2019.  Pembrolizumab 200 mg every 3 weeks started on April 01, 2019.  He is status post 7 cycles of therapy in December 2020.  Therapy stopped because of of patient preference, excellent clinical response and treatment holiday.   Current therapy: Active surveillance  Interim History: Danny Wood returns today for a repeat evaluation.  Since the last visit, he reports in the last few weeks increase in his chest wall pain and dysphagia.  His pain is around the lower part of his sternum which is similar pain that he had at the time of his cancer diagnosis.  This has resulted in him taking multiple Percocets a day without any relief.  Have also lost close to 10 pounds he is he is not eating as well.  He has not been able to sleep properly.  He denies any shortness of breath or cough.  He denies any fevers or hemoptysis.                 Medications:  Updated on review. Current Outpatient Medications  Medication Sig Dispense Refill  . Insulin Glargine (BASAGLAR KWIKPEN) 100 UNIT/ML SOPN Inject 16 Units into the skin every morning. **Replaces Lantus.** 15 mL 0  . insulin lispro (HUMALOG KWIKPEN) 100 UNIT/ML KwikPen Inject 0.02 mLs (2 Units total) into the skin 2 (two) times daily with a meal. 1.2 mL 2  . oxyCODONE-acetaminophen (PERCOCET) 5-325 MG tablet Take 1-2 tablets by mouth every 6 (six) hours as needed for moderate pain or severe pain. 90 tablet 0   No current facility-administered medications for this visit.     Allergies: No Known Allergies    Physical Exam:          ECOG: 1   General appearance: Alert, awake appeared in mild distress. Head: Atraumatic without abnormalities Oropharynx: Without any thrush or ulcers. Eyes: No scleral icterus. Lymph nodes: No lymphadenopathy noted in the cervical, supraclavicular, or axillary nodes Heart:regular rate and rhythm, without any murmurs or gallops.   Lung: Clear to auscultation without any rhonchi, wheezes or dullness to percussion. Chest wall inspection did not show any masses or lesions.  Tender to touch around the lower third of his sternum. Abdomin: Soft, nontender without any shifting dullness or ascites. Musculoskeletal: No clubbing or cyanosis. Neurological: No motor or sensory deficits. Skin: No rashes or lesions.          .   Lab Results: Lab Results  Component Value Date   WBC 7.0 01/04/2020   HGB 16.2 01/04/2020   HCT 47.0  01/04/2020   MCV 85.8 01/04/2020   PLT 196 01/04/2020     Chemistry       Impression and Plan:  69 year old man with:    1.  Bladder cancer diagnosed in 2015 and subsequently developed stage IV disease and pelvic involvement.   He is currently on a treatment break after achieving excellent response to Pembrolizumab.  Treatment options moving forward were reviewed which include continued active surveillance  versus restarting Pembrolizumab or switching to different salvage therapy such as Padcev.  He has experienced clinical decline that is likely consistent with disease progression.  Based on these findings I recommended proceeding with salvage chemotherapy utilizing Padcev.  Complication associated with the treatments including nausea, vomiting, myelosuppression, neuropathy and hyperglycemia.  2.  IV access: Port-A-Cath remains in place and will be flushed periodically.  3.  Immune therapy considerations: He has not experienced any immune mediated issues at this time.  I continue to educate him about potential complications including pneumonitis, colitis among others.  4.  Pain: Predominantly in his chest wall and presumably related to cancer progression.  I recommended proceeding with long acting pain medication given the severity of his pain and the frequent use of short acting pain medication.  He will start on morphine 30 mg twice a day uses Percocet as needed.   5.  Prognosis: Therapy remains palliative although aggressive measures are warranted despite his incurable disease.  He continues to have reasonable performance status.    6. Followup: Will be in the immediate future to start salvage chemotherapy after repeat imaging studies which is scheduled soon.  30  minutes were spent on this encounter.  The time was dedicated to updating his disease status, addressing complication related to his cancer status therapy and future plan of care discussion.    Danny Button, MD 5/28/20211:01 PM

## 2020-01-28 NOTE — Telephone Encounter (Signed)
Pt came in for visit today with Dr.Shadad during routine questioning Pt. stated that he had thoughts of harming himself. Webb Silversmith form Social work called to come evaluate the pt.

## 2020-01-28 NOTE — Progress Notes (Signed)
DISCONTINUE ON PATHWAY REGIMEN - Bladder     A cycle is every 28 days:     Enfortumab vedotin-ejfv   **Always confirm dose/schedule in your pharmacy ordering system**  REASON: Disease Progression PRIOR TREATMENT: [id number]: Enfortumab vedotin 1.25 mg/kg D1, 8, 15 q28 Days Until Progression or Unacceptable Toxicity TREATMENT RESPONSE: Partial Response (PR)  START ON PATHWAY REGIMEN - Bladder     A cycle is every 28 days:     Enfortumab vedotin-ejfv   **Always confirm dose/schedule in your pharmacy ordering system**  Patient Characteristics: Advanced/Metastatic Disease, Third Line and Beyond Therapeutic Status: Advanced/Metastatic Disease Line of Therapy: Third Line and Beyond  Intent of Therapy: Non-Curative / Palliative Intent, Discussed with Patient

## 2020-01-28 NOTE — Progress Notes (Addendum)
[place] Suicide Risk Assessment Clinical Social Work  Clinical Social Work was referred by Therapist, sports for suicidal ideation. CSW met w patient in exam room after oncologist visit.   Patient confirmed that he expressed passive suicidal ideatio to RNn, feeling that he might shoot himself, per RN patient was tearful when expressing this feeling. Patient now  does not have intention to take his life, nor does he have a plan to do so.  He reports that his verbalization related to current significant increase in pain in past two weeks.   Patient confirms that he did tell RN he had thought about shooting himself due to pain, but would not shoot himself as it would be "messy."   He confirmed that he would not consider shooting himself or taking his life by any means due to the impact on his family.  He stated that he was "joking, made an offhand statement" about this.  Denied any current or recent intent or plan to take his life.  He denies any mental health treatment in the past or prior suicide attempts.  States that he definitely did not want to take his life.  He fears the impact on his children and grandchildren.  He is aware that if he takes his life, this would have a very negative impact on future generations.  He also is aware that if he takes his life, this would have a negative financial impact on his family and providing for them is very important to him.  His daughter and one granddaughter live with him, his son and another granddaughter live 2 doors away.  He spends a lot of time w his children and grandchildren.  Rates his children as primary supports for him.    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  He enjoys playing with them and spends a lot of time w them.  He rates his children as his primary emotional support.  When asked who he could speak to if he did feel  suicidal, he states he would talk w his wife or children - all of whom are readily available to him.  He has recently acquired an RV and is fixing it up, installing a new engine.  Has plans to [place] a [place] and use his metal detector to find objects on the beach.  He also hopes to go to [place] in the RV.    He reports that for the past two weeks, both eating and sleeping have been very difficult due to new and severe pain.  After visiting w his oncologist, he is encouraged by the prescription of a new pain medication that would be more effective in controlling pain.  He would like to be able to sleep throughout the night - currently he is awakened by pain every two hours.  Currently he is unable to eat much other than baby food consistency foods.  Overall, he reports that his physical pain was what he was trying to communicate by voicing passive suicidal ideation.    CSW and patient reviewed what to do if pain of any kind becomes overwhelming.  He discussed getting in the shower, using strong smells/tastes to distract.   He verbalized speaking w his wife or daughter/son.  He also watches YouTube videos of RV conversions.  He was provided with a suicide prevention information  sheet, highlighting the National Suicide Prevention Hotline.  He is also aware that he can drive to the [place] ED for help at any time.  He verbalized agreement with all these preventative options.  However, overall his major concern is physical pain and he is appreciative of the oncologist's attention to pain control and new medication prescribed today.  He is hopeful that this will address his needs.    Clinical Education officer, museum and patient discussed plan and completed Surveyor, mining. The patient identified the following plan of action which patient agrees to utilize should suicidal ideation increase occur: 1) Contact wife/son/daughter or 2) Call 911 or  or 3) Go to [facility] Emergency Department for psychiatric  evaluation or 4) Call National Suicide Prevention Hotline.    MD notified of safety contract.  Safety contract has been sent to medical records department to be scanned in to patients medical record.  Edwyna Shell, LCSW Clinical Social Worker Phone:  [phone]

## 2020-02-01 ENCOUNTER — Telehealth: Payer: Self-pay | Admitting: Oncology

## 2020-02-01 NOTE — Telephone Encounter (Signed)
Scheduled appt per 5/28 los.  Spoke with pt and they are aware of their appt date and time.  

## 2020-02-03 ENCOUNTER — Telehealth: Payer: Self-pay | Admitting: *Deleted

## 2020-02-03 NOTE — Telephone Encounter (Signed)
[place] Work  Clinical Social Work attempted to call patient to follow up from previous medical oncology visit.  CSW left voicemail requesting patient return call.  Gwinda Maine, LCSW  Clinical Social Worker [facility]

## 2020-02-07 ENCOUNTER — Other Ambulatory Visit: Payer: Self-pay

## 2020-02-07 ENCOUNTER — Inpatient Hospital Stay: Payer: Medicare Other | Attending: Oncology

## 2020-02-07 ENCOUNTER — Inpatient Hospital Stay: Payer: Medicare Other

## 2020-02-07 DIAGNOSIS — R112 Nausea with vomiting, unspecified: Secondary | ICD-10-CM | POA: Diagnosis not present

## 2020-02-07 DIAGNOSIS — R188 Other ascites: Secondary | ICD-10-CM | POA: Insufficient documentation

## 2020-02-07 DIAGNOSIS — R1012 Left upper quadrant pain: Secondary | ICD-10-CM | POA: Insufficient documentation

## 2020-02-07 DIAGNOSIS — Z85528 Personal history of other malignant neoplasm of kidney: Secondary | ICD-10-CM | POA: Insufficient documentation

## 2020-02-07 DIAGNOSIS — Z5112 Encounter for antineoplastic immunotherapy: Secondary | ICD-10-CM | POA: Diagnosis present

## 2020-02-07 DIAGNOSIS — G893 Neoplasm related pain (acute) (chronic): Secondary | ICD-10-CM | POA: Insufficient documentation

## 2020-02-07 DIAGNOSIS — Z87891 Personal history of nicotine dependence: Secondary | ICD-10-CM | POA: Diagnosis not present

## 2020-02-07 DIAGNOSIS — Z79899 Other long term (current) drug therapy: Secondary | ICD-10-CM | POA: Insufficient documentation

## 2020-02-07 DIAGNOSIS — E860 Dehydration: Secondary | ICD-10-CM | POA: Diagnosis not present

## 2020-02-07 DIAGNOSIS — M5136 Other intervertebral disc degeneration, lumbar region: Secondary | ICD-10-CM | POA: Insufficient documentation

## 2020-02-07 DIAGNOSIS — R590 Localized enlarged lymph nodes: Secondary | ICD-10-CM | POA: Insufficient documentation

## 2020-02-07 DIAGNOSIS — R000 Tachycardia, unspecified: Secondary | ICD-10-CM | POA: Insufficient documentation

## 2020-02-07 DIAGNOSIS — R634 Abnormal weight loss: Secondary | ICD-10-CM | POA: Insufficient documentation

## 2020-02-07 DIAGNOSIS — R531 Weakness: Secondary | ICD-10-CM | POA: Diagnosis not present

## 2020-02-07 DIAGNOSIS — R1013 Epigastric pain: Secondary | ICD-10-CM | POA: Diagnosis not present

## 2020-02-07 DIAGNOSIS — C679 Malignant neoplasm of bladder, unspecified: Secondary | ICD-10-CM | POA: Diagnosis present

## 2020-02-07 DIAGNOSIS — R509 Fever, unspecified: Secondary | ICD-10-CM | POA: Diagnosis not present

## 2020-02-07 DIAGNOSIS — Z905 Acquired absence of kidney: Secondary | ICD-10-CM | POA: Insufficient documentation

## 2020-02-07 DIAGNOSIS — I700 Atherosclerosis of aorta: Secondary | ICD-10-CM | POA: Diagnosis not present

## 2020-02-07 DIAGNOSIS — R0609 Other forms of dyspnea: Secondary | ICD-10-CM | POA: Diagnosis not present

## 2020-02-07 DIAGNOSIS — Z6836 Body mass index (BMI) 36.0-36.9, adult: Secondary | ICD-10-CM | POA: Insufficient documentation

## 2020-02-07 DIAGNOSIS — R0602 Shortness of breath: Secondary | ICD-10-CM | POA: Insufficient documentation

## 2020-02-07 DIAGNOSIS — C670 Malignant neoplasm of trigone of bladder: Secondary | ICD-10-CM

## 2020-02-07 DIAGNOSIS — M47816 Spondylosis without myelopathy or radiculopathy, lumbar region: Secondary | ICD-10-CM | POA: Diagnosis not present

## 2020-02-07 DIAGNOSIS — R197 Diarrhea, unspecified: Secondary | ICD-10-CM | POA: Insufficient documentation

## 2020-02-07 DIAGNOSIS — Z95828 Presence of other vascular implants and grafts: Secondary | ICD-10-CM

## 2020-02-07 DIAGNOSIS — Z9079 Acquired absence of other genital organ(s): Secondary | ICD-10-CM | POA: Insufficient documentation

## 2020-02-07 DIAGNOSIS — R918 Other nonspecific abnormal finding of lung field: Secondary | ICD-10-CM | POA: Diagnosis not present

## 2020-02-07 LAB — CMP (CANCER CENTER ONLY)
ALT: 33 U/L (ref 0–44)
AST: 23 U/L (ref 15–41)
Albumin: 3.2 g/dL — ABNORMAL LOW (ref 3.5–5.0)
Alkaline Phosphatase: 63 U/L (ref 38–126)
Anion gap: 10 (ref 5–15)
BUN: 15 mg/dL (ref 8–23)
CO2: 26 mmol/L (ref 22–32)
Calcium: 9.6 mg/dL (ref 8.9–10.3)
Chloride: 100 mmol/L (ref 98–111)
Creatinine: 1.12 mg/dL (ref 0.61–1.24)
GFR, Est AFR Am: >60 mL/min (ref >60)
GFR, Estimated: >60 mL/min (ref >60)
Glucose, Bld: 231 mg/dL — ABNORMAL HIGH (ref 70–99)
Potassium: 4.6 mmol/L (ref 3.5–5.1)
Sodium: 136 mmol/L (ref 135–145)
Total Bilirubin: 0.5 mg/dL (ref 0.3–1.2)
Total Protein: 7.2 g/dL (ref 6.5–8.1)

## 2020-02-07 LAB — CBC WITH DIFFERENTIAL (CANCER CENTER ONLY)
Abs Immature Granulocytes: 0.02 10*3/uL (ref 0.00–0.07)
Basophils Absolute: 0 10*3/uL (ref 0.0–0.1)
Basophils Relative: 0 %
Eosinophils Absolute: 0.1 10*3/uL (ref 0.0–0.5)
Eosinophils Relative: 1 %
HCT: 45.7 % (ref 39.0–52.0)
Hemoglobin: 15.7 g/dL (ref 13.0–17.0)
Immature Granulocytes: 0 %
Lymphocytes Relative: 20 %
Lymphs Abs: 1.5 10*3/uL (ref 0.7–4.0)
MCH: 29.2 pg (ref 26.0–34.0)
MCHC: 34.4 g/dL (ref 30.0–36.0)
MCV: 84.9 fL (ref 80.0–100.0)
Monocytes Absolute: 0.6 10*3/uL (ref 0.1–1.0)
Monocytes Relative: 8 %
Neutro Abs: 5.3 10*3/uL (ref 1.7–7.7)
Neutrophils Relative %: 71 %
Platelet Count: 249 10*3/uL (ref 150–400)
RBC: 5.38 MIL/uL (ref 4.22–5.81)
RDW: 11.9 % (ref 11.5–15.5)
WBC Count: 7.5 10*3/uL (ref 4.0–10.5)
nRBC: 0 % (ref 0.0–0.2)

## 2020-02-07 MED ORDER — SODIUM CHLORIDE 0.9% FLUSH
10.0000 mL | Freq: Once | INTRAVENOUS | Status: AC
  Administered 2020-02-07: 10 mL
  Filled 2020-02-07: qty 10

## 2020-02-07 MED ORDER — HEPARIN SOD (PORK) LOCK FLUSH 100 UNIT/ML IV SOLN
500.0000 [IU] | Freq: Once | INTRAVENOUS | Status: AC
  Administered 2020-02-07: 500 [IU]
  Filled 2020-02-07: qty 5

## 2020-02-08 ENCOUNTER — Encounter (HOSPITAL_COMMUNITY): Payer: Self-pay

## 2020-02-08 ENCOUNTER — Ambulatory Visit (HOSPITAL_COMMUNITY)
Admission: RE | Admit: 2020-02-08 | Discharge: 2020-02-08 | Disposition: A | Discharge status: Home / Self Care | Payer: Managed Care, Other (non HMO) | Source: Ambulatory Visit | Attending: Oncology | Admitting: Oncology

## 2020-02-08 ENCOUNTER — Telehealth: Payer: Self-pay

## 2020-02-08 DIAGNOSIS — C679 Malignant neoplasm of bladder, unspecified: Secondary | ICD-10-CM | POA: Diagnosis present

## 2020-02-08 MED ORDER — SODIUM CHLORIDE 0.9 % IV SOLN
INTRAVENOUS | Status: AC
  Filled 2020-02-08: qty 250

## 2020-02-08 MED ORDER — HEPARIN SOD (PORK) LOCK FLUSH 100 UNIT/ML IV SOLN
500.0000 [IU] | Freq: Once | INTRAVENOUS | Status: AC
  Administered 2020-02-08: 500 [IU] via INTRAVENOUS

## 2020-02-08 MED ORDER — SODIUM CHLORIDE (PF) 0.9 % IJ SOLN
INTRAMUSCULAR | Status: AC
  Filled 2020-02-08: qty 50

## 2020-02-08 MED ORDER — HEPARIN SOD (PORK) LOCK FLUSH 100 UNIT/ML IV SOLN
INTRAVENOUS | Status: AC
  Filled 2020-02-08: qty 5

## 2020-02-08 MED ORDER — IOHEXOL 300 MG/ML  SOLN
100.0000 mL | Freq: Once | INTRAMUSCULAR | Status: AC | PRN
  Administered 2020-02-08: 100 mL via INTRAVENOUS

## 2020-02-08 NOTE — Telephone Encounter (Signed)
Received a call from [place] at [facility] Radiology requesting a call back for CT report. Called back and Dr. Alen Blew made aware of report. No further instructions received.

## 2020-02-11 ENCOUNTER — Inpatient Hospital Stay: Payer: Medicare Other

## 2020-02-11 ENCOUNTER — Other Ambulatory Visit: Payer: Self-pay

## 2020-02-11 VITALS — BP 134/76 | HR 98 | Temp 97.6°F | Resp 16 | Ht 73.0 in | Wt 179.2 lb

## 2020-02-11 DIAGNOSIS — Z95828 Presence of other vascular implants and grafts: Secondary | ICD-10-CM

## 2020-02-11 DIAGNOSIS — C679 Malignant neoplasm of bladder, unspecified: Secondary | ICD-10-CM

## 2020-02-11 DIAGNOSIS — Z5112 Encounter for antineoplastic immunotherapy: Secondary | ICD-10-CM | POA: Diagnosis not present

## 2020-02-11 LAB — CBC WITH DIFFERENTIAL (CANCER CENTER ONLY)
Abs Immature Granulocytes: 0.06 10*3/uL (ref 0.00–0.07)
Basophils Absolute: 0 10*3/uL (ref 0.0–0.1)
Basophils Relative: 0 %
Eosinophils Absolute: 0 10*3/uL (ref 0.0–0.5)
Eosinophils Relative: 0 %
HCT: 44.5 % (ref 39.0–52.0)
Hemoglobin: 15.3 g/dL (ref 13.0–17.0)
Immature Granulocytes: 1 %
Lymphocytes Relative: 9 %
Lymphs Abs: 1.1 10*3/uL (ref 0.7–4.0)
MCH: 29.2 pg (ref 26.0–34.0)
MCHC: 34.4 g/dL (ref 30.0–36.0)
MCV: 84.9 fL (ref 80.0–100.0)
Monocytes Absolute: 1.1 10*3/uL — ABNORMAL HIGH (ref 0.1–1.0)
Monocytes Relative: 9 %
Neutro Abs: 9.7 10*3/uL — ABNORMAL HIGH (ref 1.7–7.7)
Neutrophils Relative %: 81 %
Platelet Count: 233 10*3/uL (ref 150–400)
RBC: 5.24 MIL/uL (ref 4.22–5.81)
RDW: 12 % (ref 11.5–15.5)
WBC Count: 12 10*3/uL — ABNORMAL HIGH (ref 4.0–10.5)
nRBC: 0 % (ref 0.0–0.2)

## 2020-02-11 LAB — CMP (CANCER CENTER ONLY)
ALT: 25 U/L (ref 0–44)
AST: 17 U/L (ref 15–41)
Albumin: 3.2 g/dL — ABNORMAL LOW (ref 3.5–5.0)
Alkaline Phosphatase: 62 U/L (ref 38–126)
Anion gap: 9 (ref 5–15)
BUN: 19 mg/dL (ref 8–23)
CO2: 28 mmol/L (ref 22–32)
Calcium: 9.3 mg/dL (ref 8.9–10.3)
Chloride: 97 mmol/L — ABNORMAL LOW (ref 98–111)
Creatinine: 1.17 mg/dL (ref 0.61–1.24)
GFR, Est AFR Am: >60 mL/min (ref >60)
GFR, Estimated: >60 mL/min (ref >60)
Glucose, Bld: 256 mg/dL — ABNORMAL HIGH (ref 70–99)
Potassium: 4.5 mmol/L (ref 3.5–5.1)
Sodium: 134 mmol/L — ABNORMAL LOW (ref 135–145)
Total Bilirubin: 0.9 mg/dL (ref 0.3–1.2)
Total Protein: 7 g/dL (ref 6.5–8.1)

## 2020-02-11 MED ORDER — SODIUM CHLORIDE 0.9 % IV SOLN
10.0000 mg | Freq: Once | INTRAVENOUS | Status: AC
  Administered 2020-02-11: 10 mg via INTRAVENOUS
  Filled 2020-02-11: qty 10

## 2020-02-11 MED ORDER — PALONOSETRON HCL INJECTION 0.25 MG/5ML
0.2500 mg | Freq: Once | INTRAVENOUS | Status: AC
  Administered 2020-02-11: 0.25 mg via INTRAVENOUS

## 2020-02-11 MED ORDER — SODIUM CHLORIDE 0.9 % IV SOLN
150.0000 mg | Freq: Once | INTRAVENOUS | Status: AC
  Administered 2020-02-11: 150 mg via INTRAVENOUS
  Filled 2020-02-11: qty 150

## 2020-02-11 MED ORDER — SODIUM CHLORIDE 0.9 % IV SOLN
Freq: Once | INTRAVENOUS | Status: AC
  Filled 2020-02-11: qty 250

## 2020-02-11 MED ORDER — OXYCODONE HCL 5 MG PO TABS: 5.0000 mg | ORAL_TABLET | Freq: Once | ORAL | Status: DC

## 2020-02-11 MED ORDER — ACETAMINOPHEN 325 MG PO TABS: 325.0000 mg | ORAL_TABLET | Freq: Once | ORAL | Status: DC

## 2020-02-11 MED ORDER — SODIUM CHLORIDE 0.9 % IV SOLN
100.0000 mg | Freq: Once | INTRAVENOUS | Status: AC
  Administered 2020-02-11: 100 mg via INTRAVENOUS
  Filled 2020-02-11: qty 6

## 2020-02-11 MED ORDER — OXYCODONE HCL 5 MG PO TABS
10.0000 mg | ORAL_TABLET | Freq: Once | ORAL | Status: AC
  Administered 2020-02-11: 10 mg via ORAL

## 2020-02-11 MED ORDER — OXYCODONE HCL 5 MG PO TABS
ORAL_TABLET | ORAL | Status: AC
  Filled 2020-02-11: qty 2

## 2020-02-11 MED ORDER — ACETAMINOPHEN 325 MG PO TABS
650.0000 mg | ORAL_TABLET | Freq: Once | ORAL | Status: AC
  Administered 2020-02-11: 650 mg via ORAL

## 2020-02-11 MED ORDER — PALONOSETRON HCL INJECTION 0.25 MG/5ML
INTRAVENOUS | Status: AC
  Filled 2020-02-11: qty 5

## 2020-02-11 MED ORDER — HEPARIN SOD (PORK) LOCK FLUSH 100 UNIT/ML IV SOLN
500.0000 [IU] | Freq: Once | INTRAVENOUS | Status: AC | PRN
  Administered 2020-02-11: 500 [IU]
  Filled 2020-02-11: qty 5

## 2020-02-11 MED ORDER — ACETAMINOPHEN 325 MG PO TABS
ORAL_TABLET | ORAL | Status: AC
  Filled 2020-02-11: qty 2

## 2020-02-11 MED ORDER — PROCHLORPERAZINE MALEATE 10 MG PO TABS
10.0000 mg | ORAL_TABLET | Freq: Four times a day (QID) | ORAL | 0 refills | Status: DC | PRN
Start: 2020-02-11 — End: 2020-02-15

## 2020-02-11 MED ORDER — SODIUM CHLORIDE 0.9% FLUSH
10.0000 mL | INTRAVENOUS | Status: DC | PRN
  Administered 2020-02-11: 10 mL
  Filled 2020-02-11: qty 10

## 2020-02-11 MED ORDER — SODIUM CHLORIDE 0.9% FLUSH
10.0000 mL | Freq: Once | INTRAVENOUS | Status: AC
  Administered 2020-02-11: 10 mL
  Filled 2020-02-11: qty 10

## 2020-02-11 NOTE — Progress Notes (Signed)
02/11/20  Patient arrives with nausea and significant weight loss.  MD contacted and approved to add Emend 150 mg IV to treatment plan.  PA team notified - ok to proceed should have determination by end of day 02/11/20 NCCN category 1   T.Jenetta Downer Dr Creta Levin, PharmD

## 2020-02-11 NOTE — Progress Notes (Signed)
Pt. states he has been having nausea and not eating or drinking well. Per pt. weight has a 11 lb weight loss since 01/28/20. Pt. also states he is dizzy at times. Gait unsteady. Sandi Mealy PA notified and new orders given. Pt. received Normal Saline 1000 ml's per order prior to chemo treatment. Per Dr. Alen Blew, ok for treatment today with Glucose = 256. Pt. complained of chest/tumor pain anterior chest radiates to lower back. Did not bring his pain medication today. Sandi Mealy PA notified and new orders received. Medicated with Oxycodone 10 mg and Tylenol 650 mg po.

## 2020-02-11 NOTE — Patient Instructions (Signed)
[facility] Discharge Instructions for Patients Receiving Chemotherapy  Today you received the following chemotherapy agent: Enfortumab-vedotin-ejfv (Padcev)  To help prevent nausea and vomiting after your treatment, we encourage you to take your nausea medication as directed by your MD.   If you develop nausea and vomiting that is not controlled by your nausea medication, call the clinic.   BELOW ARE SYMPTOMS THAT SHOULD BE REPORTED IMMEDIATELY:  *FEVER GREATER THAN 100.5 F  *CHILLS WITH OR WITHOUT FEVER  NAUSEA AND VOMITING THAT IS NOT CONTROLLED WITH YOUR NAUSEA MEDICATION  *UNUSUAL SHORTNESS OF BREATH  *UNUSUAL BRUISING OR BLEEDING  TENDERNESS IN MOUTH AND THROAT WITH OR WITHOUT PRESENCE OF ULCERS  *URINARY PROBLEMS  *BOWEL PROBLEMS  UNUSUAL RASH Items with * indicate a potential emergency and should be followed up as soon as possible.  Feel free to call the clinic should you have any questions or concerns. The clinic phone number is [phone].  Please show the [place] at check-in to the Emergency Department and triage nurse.

## 2020-02-14 ENCOUNTER — Telehealth: Payer: Self-pay | Admitting: Emergency Medicine

## 2020-02-14 NOTE — Telephone Encounter (Signed)
Chemo f/u call, spoke with wife Mechele Claude (ROI signed).  Per wife pt has been slightly fatigued but is eating/drinking better this weekend than he has over the last few weeks, states he appears to be feeling better overall and improving.  Joanna/pt aware to call CC to f/u as needed with any further questions/concerns.

## 2020-02-14 NOTE — Telephone Encounter (Signed)
-----   Message from Lester Perry, RN sent at 02/11/2020  3:32 PM EDT ----- Regarding: Dr. Alen Blew First Time PADCEV. Pt tolerated well. Having difficulty eating with weight loss 11 lbs. Gave him Normal Saline 1000 ml before treatment.

## 2020-02-15 ENCOUNTER — Other Ambulatory Visit: Payer: Self-pay | Admitting: Oncology

## 2020-02-15 NOTE — Progress Notes (Signed)
02/15/20  Patient insurance would not cover Aloxi or Emend.  Received orders to DC both and alter premedications to current dexamethasone 10 mg IVPB and ADD prochlorperazine 10 mg po x 1 prior to chemotherapy.  Orders have been updated.  T.O. Dr Creta Levin, PharmD

## 2020-02-16 ENCOUNTER — Other Ambulatory Visit: Payer: Self-pay

## 2020-02-16 ENCOUNTER — Inpatient Hospital Stay (HOSPITAL_BASED_OUTPATIENT_CLINIC_OR_DEPARTMENT_OTHER): Payer: Medicare Other | Admitting: Medical

## 2020-02-16 ENCOUNTER — Inpatient Hospital Stay: Payer: Medicare Other

## 2020-02-16 ENCOUNTER — Encounter: Payer: Self-pay | Admitting: *Deleted

## 2020-02-16 ENCOUNTER — Telehealth: Payer: Self-pay

## 2020-02-16 VITALS — BP 136/81 | HR 89 | Temp 98.0°F | Resp 18 | Ht 73.0 in | Wt 175.8 lb

## 2020-02-16 DIAGNOSIS — Z5112 Encounter for antineoplastic immunotherapy: Secondary | ICD-10-CM | POA: Diagnosis not present

## 2020-02-16 DIAGNOSIS — R531 Weakness: Secondary | ICD-10-CM

## 2020-02-16 DIAGNOSIS — G893 Neoplasm related pain (acute) (chronic): Secondary | ICD-10-CM

## 2020-02-16 DIAGNOSIS — C679 Malignant neoplasm of bladder, unspecified: Secondary | ICD-10-CM

## 2020-02-16 DIAGNOSIS — Z95828 Presence of other vascular implants and grafts: Secondary | ICD-10-CM | POA: Diagnosis not present

## 2020-02-16 LAB — CBC (CANCER CENTER ONLY)
HCT: 49.3 % (ref 39.0–52.0)
Hemoglobin: 17.1 g/dL — ABNORMAL HIGH (ref 13.0–17.0)
MCH: 29.3 pg (ref 26.0–34.0)
MCHC: 34.7 g/dL (ref 30.0–36.0)
MCV: 84.4 fL (ref 80.0–100.0)
Platelet Count: 226 10*3/uL (ref 150–400)
RBC: 5.84 MIL/uL — ABNORMAL HIGH (ref 4.22–5.81)
RDW: 12.2 % (ref 11.5–15.5)
WBC Count: 9.9 10*3/uL (ref 4.0–10.5)
nRBC: 0 % (ref 0.0–0.2)

## 2020-02-16 LAB — CMP (CANCER CENTER ONLY)
ALT: 42 U/L (ref 0–44)
AST: 31 U/L (ref 15–41)
Albumin: 3.5 g/dL (ref 3.5–5.0)
Alkaline Phosphatase: 69 U/L (ref 38–126)
Anion gap: 10 (ref 5–15)
BUN: 21 mg/dL (ref 8–23)
CO2: 28 mmol/L (ref 22–32)
Calcium: 9.6 mg/dL (ref 8.9–10.3)
Chloride: 97 mmol/L — ABNORMAL LOW (ref 98–111)
Creatinine: 1.13 mg/dL (ref 0.61–1.24)
GFR, Est AFR Am: >60 mL/min (ref >60)
GFR, Estimated: >60 mL/min (ref >60)
Glucose, Bld: 191 mg/dL — ABNORMAL HIGH (ref 70–99)
Potassium: 4.5 mmol/L (ref 3.5–5.1)
Sodium: 135 mmol/L (ref 135–145)
Total Bilirubin: 1 mg/dL (ref 0.3–1.2)
Total Protein: 7.4 g/dL (ref 6.5–8.1)

## 2020-02-16 LAB — MAGNESIUM: Magnesium: 1.3 mg/dL — CL (ref 1.7–2.4)

## 2020-02-16 MED ORDER — SODIUM CHLORIDE 0.9 % IV SOLN
Freq: Once | INTRAVENOUS | Status: AC
  Filled 2020-02-16: qty 1000

## 2020-02-16 MED ORDER — MORPHINE SULFATE (PF) 2 MG/ML IV SOLN
1.0000 mg | Freq: Once | INTRAVENOUS | Status: AC
  Administered 2020-02-16: 1 mg via INTRAVENOUS

## 2020-02-16 MED ORDER — FENTANYL 25 MCG/HR TD PT72: 1.0000 | MEDICATED_PATCH | TRANSDERMAL | 0 refills | Status: DC

## 2020-02-16 MED ORDER — MORPHINE SULFATE (PF) 2 MG/ML IV SOLN
INTRAVENOUS | Status: AC
  Filled 2020-02-16: qty 1

## 2020-02-16 MED ORDER — MAGNESIUM SULFATE 4 GM/100ML IV SOLN: 4.0000 g | Freq: Once | INTRAVENOUS | Status: DC

## 2020-02-16 MED ORDER — HEPARIN SOD (PORK) LOCK FLUSH 100 UNIT/ML IV SOLN
500.0000 [IU] | Freq: Once | INTRAVENOUS | Status: AC
  Administered 2020-02-16: 500 [IU]
  Filled 2020-02-16: qty 5

## 2020-02-16 MED ORDER — SODIUM CHLORIDE 0.9 % IV SOLN
INTRAVENOUS | Status: DC
  Filled 2020-02-16 (×2): qty 250

## 2020-02-16 MED ORDER — SODIUM CHLORIDE 0.9% FLUSH
10.0000 mL | Freq: Once | INTRAVENOUS | Status: AC
  Administered 2020-02-16: 10 mL
  Filled 2020-02-16: qty 10

## 2020-02-16 MED ORDER — OXYCODONE HCL 5 MG PO TABS: ORAL_TABLET | ORAL | 0 refills | Status: DC

## 2020-02-16 MED ORDER — DULOXETINE HCL 20 MG PO CPEP: 20.0000 mg | ORAL_CAPSULE | Freq: Two times a day (BID) | ORAL | 2 refills | Status: DC

## 2020-02-16 NOTE — Progress Notes (Signed)
Patient completed IVF with magnesium. Patient complained of 8/10 pain in his sternum with radiation to his back. Patient reported this pain earlier in the day with relief from pain medication. Called Sandi Mealy, PA-C regarding onset of pain. Received verbal orders for 1 mg of morphine. Tanner felt comfortable giving smaller dose to patient since patient had driven himself.  Morphine given to patient and fluids completed.  Vitals retaken and were stable.  Patient reported that he did not feel sleepy and was comfortable driving home.

## 2020-02-16 NOTE — Progress Notes (Signed)
CRITICAL VALUE ALERT  Critical Value:  Mag 1.3  Date & Time Notied:  02/16/20 at 1450  Provider Notified: Sandi Mealy, PA  Orders Received/Actions taken: Verbalized understanding.

## 2020-02-16 NOTE — Patient Instructions (Signed)
Hypomagnesemia Hypomagnesemia is a condition in which the level of magnesium in the blood is low. Magnesium is a mineral that is found in many foods. It is used in many different processes in the body. Hypomagnesemia can affect every organ in the body. In severe cases, it can cause life-threatening problems. What are the causes? This condition may be caused by:  Not getting enough magnesium in your diet.  Malnutrition.  Problems with absorbing magnesium from the intestines.  Dehydration.  Alcohol abuse.  Vomiting.  Severe or chronic diarrhea.  Some medicines, including medicines that make you urinate more (diuretics).  Certain diseases, such as kidney disease, diabetes, celiac disease, and overactive thyroid. What are the signs or symptoms? Symptoms of this condition include:  Loss of appetite.  Nausea and vomiting.  Involuntary shaking or trembling of a body part (tremor).  Muscle weakness.  Tingling in the arms and legs.  Sudden tightening of muscles (muscle spasms).  Confusion.  Psychiatric issues, such as depression, irritability, or psychosis.  A feeling of fluttering of the heart.  Seizures. These symptoms are more severe if magnesium levels drop suddenly. How is this diagnosed? This condition may be diagnosed based on:  Your symptoms and medical history.  A physical exam.  Blood and urine tests. How is this treated? Treatment depends on the cause and the severity of the condition. It may be treated with:  A magnesium supplement. This can be taken in pill form. If the condition is severe, magnesium is usually given through an IV.  Changes to your diet. You may be directed to eat foods that have a lot of magnesium, such as green leafy vegetables, peas, beans, and nuts.  Stopping any intake of alcohol. Follow these instructions at home:      Make sure that your diet includes foods with magnesium. Foods that have a lot of magnesium in them  include: ? Green leafy vegetables, such as spinach and broccoli. ? Beans and peas. ? Nuts and seeds, such as almonds and sunflower seeds. ? Whole grains, such as whole grain bread and fortified cereals.  Take magnesium supplements if your health care provider tells you to do that. Take them as directed.  Take over-the-counter and prescription medicines only as told by your health care provider.  Have your magnesium levels monitored as told by your health care provider.  When you are active, drink fluids that contain electrolytes.  Avoid drinking alcohol.  Keep all follow-up visits as told by your health care provider. This is important. Contact a health care provider if:  You get worse instead of better.  Your symptoms return. Get help right away if you:  Develop severe muscle weakness.  Have trouble breathing.  Feel that your heart is racing. Summary  Hypomagnesemia is a condition in which the level of magnesium in the blood is low.  Hypomagnesemia can affect every organ in the body.  Treatment may include eating more foods that contain magnesium, taking magnesium supplements, and not drinking alcohol.  Have your magnesium levels monitored as told by your health care provider. This information is not intended to replace advice given to you by your health care provider. Make sure you discuss any questions you have with your health care provider. Document Revised: 08/01/2017 Document Reviewed: 07/21/2017 Elsevier Patient Education  2020 Elsevier Inc.  

## 2020-02-16 NOTE — Telephone Encounter (Signed)
TC from Pt stating that he feels like he needs to come in today for fluids. Pt. Stated that he is losing weight everyday because  He's not eating. Asked Pt if he was vomiting, Pt. Stated no he's not eating because he is scared to eat because of the pain. Pt. Scheduled to see Lucianne Lei in symptom management today.

## 2020-02-16 NOTE — Progress Notes (Signed)
EKG performed by RN Learta Codding, record transmitted and printed for PA Lucianne Lei to review.  Mag 1.3, critical value received.  PA Lucianne Lei aware.

## 2020-02-17 NOTE — Progress Notes (Signed)
Symptoms Management Clinic Progress Note   Danny Wood [id number] Jan 20, 1951 69 y.o.  Danny Wood is managed by Dr. Alen Blew  Actively treated with chemotherapy/immunotherapy/hormonal therapy: yes  Current therapy: Padcev  Last treated: 02/11/20 (cycle 1, day 1)  Next scheduled appointment with provider: 03/02/2020  Assessment: Plan:    Weakness - Plan: 0.9 %  sodium chloride infusion  Neoplasm related pain - Plan: morphine 2 MG/ML injection 1 mg, DULoxetine (CYMBALTA) 20 MG capsule, morphine 2 MG/ML injection 1 mg  Hypomagnesemia - Plan: sodium chloride 0.9 % 1,000 mL with magnesium sulfate 4 g infusion, DISCONTINUED: magnesium sulfate IVPB 4 g 100 mL  Port-A-Cath in place - Plan: heparin lock flush 100 unit/mL, sodium chloride flush (NS) 0.9 % injection 10 mL  Malignant neoplasm of urinary bladder, unspecified site (HCC) - Plan: heparin lock flush 100 unit/mL, sodium chloride flush (NS) 0.9 % injection 10 mL  Please see After Visit Summary for patient specific instructions.  Future Appointments  Date Time Provider [place]  02/18/2020 11:00 AM [facility]FLUSH [facility] None  02/18/2020 11:15 AM [facility] [place] FLUSH [facility] None  02/18/2020 12:00 PM [facility] INFUSION [facility] None  02/24/2020  1:00 PM [facility] 2 [facility] [place]  02/25/2020  9:30 AM [facility]/FLUSH [facility] None  02/25/2020  9:45 AM [facility] FLUSH [facility] None  02/25/2020 10:15 AM [facility] INFUSION [facility] None  03/02/2020  1:00 PM [facility] LAB 5 [facility] None  03/02/2020  1:30 PM Shadad, Mathis Dad, MD [facility] None  03/10/2020  9:45 AM [facility]/FLUSH [facility] None  03/10/2020 10:00 AM [facility] [place] FLUSH [facility] None  03/10/2020 10:30 AM [facility] INFUSION [facility] None  03/17/2020 11:00 AM [facility]/FLUSH [facility] None  03/17/2020 11:15 AM [facility] [place] FLUSH [facility] None  03/17/2020 12:30 PM [facility] INFUSION [facility] None   03/24/2020 11:15 AM [facility]/FLUSH [facility] None  03/24/2020 11:30 AM [facility] [place] FLUSH [facility] None  03/24/2020 12:30 PM [facility] INFUSION [facility] None    No orders of the defined types were placed in this encounter.      Subjective:   Patient ID:  Danny Wood is a 69 y.o. (DOB 06-Dec-1950) male.  Chief Complaint:  Chief Complaint  Patient presents with  . Abdominal Pain  . Constipation  . Anorexia    HPI Danny Wood is a 69 year old male with a diagnosis of a metastatic bladder cancer who is managed by Dr. Alen Blew is status post cycle 1, day 1 of Padcev which was dosed on 02/11/2020.  He presents to the clinic today stating that he has not eaten in 2 weeks due to abdominal pain.  He continues to have midsternal pain, right upper quadrant, and left upper quadrant pain.  He reports having constipation and has had a recent bowel movement but which was composed of blood and yellow mucus.  He reports fevers and chills at home.  He reports sweats today, nausea, and dyspnea on exertion.  He has been drinking boost and Ensure as a time but has been vomiting.  He has a prescription for MSIR 30 mg twice daily but has not been taking these due to difficulty in swallowing this.  He also has a prescription for Percocet which he has taken intermittently but reports he has difficulty swallowing these.  We discussed with the patient today the possibility of adding fentanyl patches in place of MSIR along with Cymbalta twice daily.  We also discussed discontinuing his Percocet and placing him on oxycodone IR which she is  agreeable to try.  Medications: I have reviewed the patient's current medications.  Allergies: No Known Allergies  Past Medical History:  Diagnosis Date  . At risk for sleep apnea    STOP-BANG= 4       SENT TO PCP 05-16-2014  . bladder ca dx'd 05/17/14   met dz 12/2017  . Bladder disorder   . Hematuria   . History of renal cell carcinoma    2003   S/P  PARTIAL RIGHT NEPHRECTOMY  . Presence of surgical incision    lumbar diskectomy 05-11-2014-  bandage present  . Urgency of urination     Past Surgical History:  Procedure Laterality Date  . CYSTOSCOPY N/A 09/09/2014   Procedure: CYSTOSCOPY WITH INDOCYANINE GREEN DYE INJECTION AND URETHRAL DILATION;  Surgeon: Alexis Frock, MD;  Location[facility];  Service: Urology;  Laterality: N/A;  . CYSTOSCOPY WITH BIOPSY N/A 05/17/2014   Procedure: CYSTO WITH BLADDER BIOPSY;  Surgeon: Malka So, MD;  Location: [facility];  Service: Urology;  Laterality: N/A;  . EVALUATION UNDER ANESTHESIA WITH HEMORRHOIDECTOMY  07/07/2012   Procedure: EXAM UNDER ANESTHESIA WITH HEMORRHOIDECTOMY;  Surgeon: Joyice Faster. Cornett, MD;  Location: [facility];  Service: General;  Laterality: N/A;  . IR IMAGING GUIDED PORT INSERTION  02/20/2018  . LUMBAR MICRODISCECTOMY  05-11-2014   L4 -- L5 (partial)  . PARTIAL NEPHRECTOMY Right 2003  . ROBOT ASSISTED LAPAROSCOPIC COMPLETE CYSTECT ILEAL CONDUIT N/A 09/09/2014   Procedure: ROBOTIC ASSISTED LAPAROSCOPIC COMPLETE CYSTOPROSTATECTOMY,  ILEAL CONDUIT;  Surgeon: Alexis Frock, MD;  Location: [facility];  Service: Urology;  Laterality: N/A;    Family History  Problem Relation Age of Onset  . Diabetes Neg Hx     Social History   Socioeconomic History  . Marital status: Married    Spouse name: Not on file  . Number of children: Not on file  . Years of education: Not on file  . Highest education level: Not on file  Occupational History  . Not on file  Tobacco Use  . Smoking status: Former Smoker    Packs/day: 0.50    Years: 25.00    Pack years: 12.50    Types: Cigarettes    Quit date: 06/02/2004    Years since quitting: 15.7  . Smokeless tobacco: Never Used  Substance and Sexual Activity  . Alcohol use: Yes    Alcohol/week: 2.0 standard drinks    Types: 2 Standard drinks or equivalent per week  . Drug use: No  . Sexual activity: Not on file  Other Topics  Concern  . Not on file  Social History Narrative  . Not on file   Social Determinants of Health   Financial Resource Strain:   . Difficulty of Paying Living Expenses:   Food Insecurity:   . Worried About Charity fundraiser in the Last Year:   . Arboriculturist in the Last Year:   Transportation Needs:   . Film/video editor (Medical):   Marland Kitchen Lack of Transportation (Non-Medical):   Physical Activity:   . Days of Exercise per Week:   . Minutes of Exercise per Session:   Stress:   . Feeling of Stress :   Social Connections:   . Frequency of Communication with Friends and Family:   . Frequency of Social Gatherings with Friends and Family:   . Attends Religious Services:   . Active Member of Clubs or Organizations:   . Attends Archivist Meetings:   .  Marital Status:   Intimate Partner Violence:   . Fear of Current or Ex-Partner:   . Emotionally Abused:   Marland Kitchen Physically Abused:   . Sexually Abused:     Past Medical History, Surgical history, Social history, and Family history were reviewed and updated as appropriate.   Please see review of systems for further details on the patient's review from today.   Review of Systems:  Review of Systems  Constitutional: Positive for appetite change, chills, diaphoresis and fever. Negative for activity change.  HENT: Positive for trouble swallowing.   Respiratory: Positive for shortness of breath. Negative for cough and chest tightness.   Cardiovascular: Negative for chest pain, palpitations and leg swelling.  Gastrointestinal: Positive for abdominal pain. Negative for abdominal distention, constipation, diarrhea, nausea and vomiting.    Objective:   Physical Exam:  BP 136/81 (BP Location: Left Arm, Patient Position: Sitting)   Pulse 89   Temp 98 F (36.7 C) (Oral)   Resp 18   Ht _0  (1.854 m)   Wt 175 lb 12.8 oz (79.7 kg)   SpO2 100%   BMI 23.19 kg/m  ECOG: 1  Physical Exam Constitutional:      General: He  is not in acute distress.    Appearance: He is not diaphoretic.  HENT:     Head: Normocephalic and atraumatic.  Cardiovascular:     Rate and Rhythm: Normal rate and regular rhythm.     Heart sounds: Normal heart sounds. No murmur heard.  No friction rub. No gallop.   Pulmonary:     Effort: Pulmonary effort is normal. No respiratory distress.     Breath sounds: Normal breath sounds. No wheezing or rales.  Abdominal:     General: Bowel sounds are normal. There is no distension.     Palpations: Abdomen is soft. There is no mass.     Tenderness: There is abdominal tenderness in the right upper quadrant, right lower quadrant and epigastric area. There is no guarding or rebound.  Skin:    General: Skin is warm and dry.  Neurological:     Mental Status: He is alert.  Psychiatric:        Mood and Affect: Mood is depressed.     Lab Review:     Component Value Date/Time   NA 135 02/16/2020 1358   NA 139 08/05/2014 1335   K 4.5 02/16/2020 1358   K 4.3 08/05/2014 1335   CL 97 (L) 02/16/2020 1358   CO2 28 02/16/2020 1358   CO2 26 08/05/2014 1335   GLUCOSE 191 (H) 02/16/2020 1358   GLUCOSE 97 08/05/2014 1335   BUN 21 02/16/2020 1358   BUN 18.2 08/05/2014 1335   CREATININE 1.13 02/16/2020 1358   CREATININE 1.1 08/05/2014 1335   CALCIUM 9.6 02/16/2020 1358   CALCIUM 9.5 08/05/2014 1335   PROT 7.4 02/16/2020 1358   PROT 6.5 08/05/2014 1335   ALBUMIN 3.5 02/16/2020 1358   ALBUMIN 3.9 08/05/2014 1335   AST 31 02/16/2020 1358   AST 45 (H) 08/05/2014 1335   ALT 42 02/16/2020 1358   ALT 73 (H) 08/05/2014 1335   ALKPHOS 69 02/16/2020 1358   ALKPHOS 98 08/05/2014 1335   BILITOT 1.0 02/16/2020 1358   BILITOT 0.47 08/05/2014 1335   GFRNONAA >60 02/16/2020 1358   GFRAA >60 02/16/2020 1358       Component Value Date/Time   WBC 9.9 02/16/2020 1358   WBC 6.3 07/22/2018 1256   RBC 5.84 (H) 02/16/2020  1358   HGB 17.1 (H) 02/16/2020 1358   HGB 11.8 (L) 08/05/2014 1335   HCT 49.3  02/16/2020 1358   HCT 33.1 (L) 08/05/2014 1335   PLT 226 02/16/2020 1358   PLT 305 08/05/2014 1335   MCV 84.4 02/16/2020 1358   MCV 81.9 08/05/2014 1335   MCH 29.3 02/16/2020 1358   MCHC 34.7 02/16/2020 1358   RDW 12.2 02/16/2020 1358   RDW 13.7 08/05/2014 1335   LYMPHSABS 1.1 02/11/2020 1109   LYMPHSABS 1.9 08/05/2014 1335   MONOABS 1.1 (H) 02/11/2020 1109   MONOABS 1.0 (H) 08/05/2014 1335   EOSABS 0.0 02/11/2020 1109   EOSABS 0.1 08/05/2014 1335   BASOSABS 0.0 02/11/2020 1109   BASOSABS 0.0 08/05/2014 1335   -------------------------------  Imaging from last 24 hours (if applicable):  Radiology interpretation: CT Abdomen Pelvis W Contrast  Result Date: 02/08/2020 CLINICAL DATA:  Invasive bladder cancer restaging, prior cysto prostatectomy. Ongoing chemotherapy. Umbilical and epigastric pain for 3 weeks with reduced appetite. Weight loss. EXAM: CT ABDOMEN AND PELVIS WITH CONTRAST TECHNIQUE: Multidetector CT imaging of the abdomen and pelvis was performed using the standard protocol following bolus administration of intravenous contrast. CONTRAST:  170m OMNIPAQUE IOHEXOL 300 MG/ML  SOLN COMPARISON:  CT scan 09/17/2019 FINDINGS: Lower chest: Descending thoracic aortic atherosclerotic calcification. Hepatobiliary: Unremarkable Pancreas: Unremarkable Spleen: Unremarkable Adrenals/Urinary Tract: Both adrenal glands appear normal. Scarring in the right mid kidney, stable. Left mid kidney cyst posteriorly, benign appearance. Faint abnormal hypoenhancement in the right kidney lower pole on portal venous and delayed phase images, for example image 46/2, infiltrative process such as pyelonephritis cannot be excluded. Cystoprostatectomy with ileal conduit. No hydronephrosis. No hydroureter. No appreciable filling defect along the urothelium. Stomach/Bowel: Unremarkable Vascular/Lymphatic: Progressive adenopathy compatible with malignancy. In particular, there is indistinctly marginated tumor  extending from the porta hepatis along the celiac trunk and common hepatic artery region, measuring approximately 4.5 by 2.6 by 2.8 cm on image 26/2. Along the distal in the SMA in the root of the mesentery, a 3.3 by 2.4 by 4.0 cm tumor mass has substantially increased from prior. Progressive and pathologic retroperitoneal and pelvic adenopathy noted. Index left periaortic lymph node just below the renal vessels measures 1.4 cm in short axis on image 39/2, previously 0.8 cm by my measurements. Index right inguinal node 1.8 cm in short axis on image 84/2, formerly 1.3 cm. Left inguinal lymph nodes are also prominent. Reproductive: Cystoprostatectomy. Other: Small but increased amount of ascites including along the right paracolic gutter and in the lower anatomic pelvis. Musculoskeletal: Lumbar spondylosis and degenerative disc disease causing generally mild degrees of impingement at L2-3 and L3-4. IMPRESSION: 1. Worsening retroperitoneal and pelvic adenopathy compatible with progressive malignancy. Progressive mesenteric lesions adjacent to the celiac trunk and along the distal SMA compatible with malignancy. 2. Mild but abnormal hypoenhancement in the right kidney lower pole on portal venous and delayed phase images, possibly reflecting pyelonephritis. 3. Small but increased amount of ascites. 4. Other imaging findings of potential clinical significance: Lumbar spondylosis and degenerative disc disease causing generally mild degrees of impingement at L2-3 and L3-4. Aortic atherosclerosis. Aortic Atherosclerosis (ICD10-I70.0). Electronically Signed   By: WVan ClinesM.D.   On: 02/08/2020 13:03

## 2020-02-18 ENCOUNTER — Inpatient Hospital Stay: Payer: Medicare Other

## 2020-02-18 ENCOUNTER — Other Ambulatory Visit: Payer: Self-pay

## 2020-02-18 ENCOUNTER — Other Ambulatory Visit: Payer: Self-pay | Admitting: *Deleted

## 2020-02-18 VITALS — BP 131/84 | HR 93 | Temp 97.5°F | Resp 16 | Wt 176.5 lb

## 2020-02-18 DIAGNOSIS — C679 Malignant neoplasm of bladder, unspecified: Secondary | ICD-10-CM

## 2020-02-18 DIAGNOSIS — Z5112 Encounter for antineoplastic immunotherapy: Secondary | ICD-10-CM | POA: Diagnosis not present

## 2020-02-18 DIAGNOSIS — Z95828 Presence of other vascular implants and grafts: Secondary | ICD-10-CM

## 2020-02-18 LAB — CBC WITH DIFFERENTIAL (CANCER CENTER ONLY)
Abs Immature Granulocytes: 0.04 10*3/uL (ref 0.00–0.07)
Basophils Absolute: 0 10*3/uL (ref 0.0–0.1)
Basophils Relative: 0 %
Eosinophils Absolute: 0.2 10*3/uL (ref 0.0–0.5)
Eosinophils Relative: 2 %
HCT: 46.4 % (ref 39.0–52.0)
Hemoglobin: 16 g/dL (ref 13.0–17.0)
Immature Granulocytes: 0 %
Lymphocytes Relative: 13 %
Lymphs Abs: 1.5 10*3/uL (ref 0.7–4.0)
MCH: 29.5 pg (ref 26.0–34.0)
MCHC: 34.5 g/dL (ref 30.0–36.0)
MCV: 85.5 fL (ref 80.0–100.0)
Monocytes Absolute: 0.9 10*3/uL (ref 0.1–1.0)
Monocytes Relative: 8 %
Neutro Abs: 8.8 10*3/uL — ABNORMAL HIGH (ref 1.7–7.7)
Neutrophils Relative %: 77 %
Platelet Count: 218 10*3/uL (ref 150–400)
RBC: 5.43 MIL/uL (ref 4.22–5.81)
RDW: 12.3 % (ref 11.5–15.5)
WBC Count: 11.4 10*3/uL — ABNORMAL HIGH (ref 4.0–10.5)
nRBC: 0 % (ref 0.0–0.2)

## 2020-02-18 LAB — CMP (CANCER CENTER ONLY)
ALT: 40 U/L (ref 0–44)
AST: 25 U/L (ref 15–41)
Albumin: 3.3 g/dL — ABNORMAL LOW (ref 3.5–5.0)
Alkaline Phosphatase: 73 U/L (ref 38–126)
Anion gap: 8 (ref 5–15)
BUN: 22 mg/dL (ref 8–23)
CO2: 26 mmol/L (ref 22–32)
Calcium: 9 mg/dL (ref 8.9–10.3)
Chloride: 98 mmol/L (ref 98–111)
Creatinine: 1.1 mg/dL (ref 0.61–1.24)
GFR, Est AFR Am: >60 mL/min (ref >60)
GFR, Estimated: >60 mL/min (ref >60)
Glucose, Bld: 266 mg/dL — ABNORMAL HIGH (ref 70–99)
Potassium: 4.6 mmol/L (ref 3.5–5.1)
Sodium: 132 mmol/L — ABNORMAL LOW (ref 135–145)
Total Bilirubin: 1 mg/dL (ref 0.3–1.2)
Total Protein: 6.9 g/dL (ref 6.5–8.1)

## 2020-02-18 MED ORDER — SODIUM CHLORIDE 0.9 % IV SOLN
100.0000 mg | Freq: Once | INTRAVENOUS | Status: AC
  Administered 2020-02-18: 100 mg via INTRAVENOUS
  Filled 2020-02-18: qty 6

## 2020-02-18 MED ORDER — PROCHLORPERAZINE MALEATE 10 MG PO TABS
10.0000 mg | ORAL_TABLET | Freq: Once | ORAL | Status: AC
  Administered 2020-02-18: 10 mg via ORAL

## 2020-02-18 MED ORDER — SODIUM CHLORIDE 0.9% FLUSH
10.0000 mL | Freq: Once | INTRAVENOUS | Status: AC
  Administered 2020-02-18: 10 mL
  Filled 2020-02-18: qty 10

## 2020-02-18 MED ORDER — OXYCODONE HCL 5 MG PO TABS
ORAL_TABLET | ORAL | Status: AC
  Filled 2020-02-18: qty 1

## 2020-02-18 MED ORDER — SODIUM CHLORIDE 0.9 % IV SOLN
Freq: Once | INTRAVENOUS | Status: AC
  Filled 2020-02-18: qty 250

## 2020-02-18 MED ORDER — OXYCODONE HCL 5 MG PO TABS
5.0000 mg | ORAL_TABLET | Freq: Once | ORAL | Status: AC
  Administered 2020-02-18: 5 mg via ORAL

## 2020-02-18 MED ORDER — SODIUM CHLORIDE 0.9 % IV SOLN
10.0000 mg | Freq: Once | INTRAVENOUS | Status: AC
  Administered 2020-02-18: 10 mg via INTRAVENOUS
  Filled 2020-02-18: qty 10

## 2020-02-18 MED ORDER — PROCHLORPERAZINE MALEATE 10 MG PO TABS
ORAL_TABLET | ORAL | Status: AC
  Filled 2020-02-18: qty 1

## 2020-02-18 MED ORDER — HEPARIN SOD (PORK) LOCK FLUSH 100 UNIT/ML IV SOLN
500.0000 [IU] | Freq: Once | INTRAVENOUS | Status: AC | PRN
  Administered 2020-02-18: 500 [IU]
  Filled 2020-02-18: qty 5

## 2020-02-18 MED ORDER — OXYCODONE HCL 5 MG PO TABS: 5.0000 mg | ORAL_TABLET | Freq: Once | ORAL | Status: DC

## 2020-02-18 MED ORDER — SODIUM CHLORIDE 0.9% FLUSH
10.0000 mL | INTRAVENOUS | Status: DC | PRN
  Administered 2020-02-18: 10 mL
  Filled 2020-02-18: qty 10

## 2020-02-18 NOTE — Progress Notes (Signed)
Received OK to treat orders from Dr Alen Blew despite elevated glucose & order for oxycodone for pain.

## 2020-02-18 NOTE — Patient Instructions (Signed)
[facility] Discharge Instructions for Patients Receiving Chemotherapy  Today you received the following chemotherapy agent: Enfortumab-vedotin-ejfv (Padcev)  To help prevent nausea and vomiting after your treatment, we encourage you to take your nausea medication as directed by your MD.   If you develop nausea and vomiting that is not controlled by your nausea medication, call the clinic.   BELOW ARE SYMPTOMS THAT SHOULD BE REPORTED IMMEDIATELY:  *FEVER GREATER THAN 100.5 F  *CHILLS WITH OR WITHOUT FEVER  NAUSEA AND VOMITING THAT IS NOT CONTROLLED WITH YOUR NAUSEA MEDICATION  *UNUSUAL SHORTNESS OF BREATH  *UNUSUAL BRUISING OR BLEEDING  TENDERNESS IN MOUTH AND THROAT WITH OR WITHOUT PRESENCE OF ULCERS  *URINARY PROBLEMS  *BOWEL PROBLEMS  UNUSUAL RASH Items with * indicate a potential emergency and should be followed up as soon as possible.  Feel free to call the clinic should you have any questions or concerns. The clinic phone number is [phone].  Please show the [facility] at check-in to the Emergency Department and triage nurse.

## 2020-02-21 NOTE — Progress Notes (Signed)
Pharmacist Chemotherapy Monitoring - Follow Up Assessment    I verify that I have reviewed each item in the below checklist:  . Regimen for the patient is scheduled for the appropriate day and plan matches scheduled date. Marland Kitchen Appropriate non-routine labs are ordered dependent on drug ordered. . If applicable, additional medications reviewed and ordered per protocol based on lifetime cumulative doses and/or treatment regimen.   Plan for follow-up and/or issues identified: Yes . I-vent associated with next due treatment: Yes . MD and/or nursing notified: No   Kennith Center, Pharm.D., CPP 02/21/2020@3 :37 PM

## 2020-02-22 ENCOUNTER — Other Ambulatory Visit: Payer: Self-pay | Admitting: Oncology

## 2020-02-24 ENCOUNTER — Other Ambulatory Visit: Payer: PRIVATE HEALTH INSURANCE

## 2020-02-24 ENCOUNTER — Telehealth: Payer: Self-pay

## 2020-02-24 ENCOUNTER — Ambulatory Visit (HOSPITAL_COMMUNITY)
Admission: RE | Admit: 2020-02-24 | Discharge: 2020-02-24 | Disposition: A | Discharge status: Home / Self Care | Payer: Medicare Other | Source: Ambulatory Visit | Attending: Oncology | Admitting: Oncology

## 2020-02-24 ENCOUNTER — Other Ambulatory Visit: Payer: Self-pay

## 2020-02-24 DIAGNOSIS — C679 Malignant neoplasm of bladder, unspecified: Secondary | ICD-10-CM | POA: Insufficient documentation

## 2020-02-24 MED ORDER — HEPARIN SOD (PORK) LOCK FLUSH 100 UNIT/ML IV SOLN
500.0000 [IU] | Freq: Once | INTRAVENOUS | Status: AC
  Administered 2020-02-24: 500 [IU] via INTRAVENOUS

## 2020-02-24 MED ORDER — HEPARIN SOD (PORK) LOCK FLUSH 100 UNIT/ML IV SOLN
INTRAVENOUS | Status: AC
  Filled 2020-02-24: qty 5

## 2020-02-24 MED ORDER — IOHEXOL 300 MG/ML  SOLN
75.0000 mL | Freq: Once | INTRAMUSCULAR | Status: AC | PRN
  Administered 2020-02-24: 75 mL via INTRAVENOUS

## 2020-02-24 MED ORDER — SODIUM CHLORIDE (PF) 0.9 % IJ SOLN
INTRAMUSCULAR | Status: AC
  Filled 2020-02-24: qty 50

## 2020-02-24 NOTE — Telephone Encounter (Signed)
-----   Message from Wyatt Portela, MD sent at 02/24/2020  3:35 PM EDT ----- Yes. Thanks ----- Message ----- From: Tami Lin, RN Sent: 02/24/2020   3:26 PM EDT To: Wyatt Portela, MD  Patient's wife called and wanted to make you aware that patient has not eaten in 3 weeks and is experiencing severe diarrhea. She said everything patient eats "goes right through him" so he is afraid to consume anything. Patient is drinking water- about 20 ounces today and tried a protein shake last night but it "came right out". Per wife they have not tried any medication to help with the diarrhea but will try Imodium.  Patient is scheduled for lab, flush, Padcev tomorrow. Do you want me to have Lucianne Lei see him tomorrow? Lanelle Bal

## 2020-02-24 NOTE — Telephone Encounter (Signed)
Called and made patient's wife aware that Dr. Alen Blew has been made aware and patient will be evaluated by Physician Assistant  in Symptom Management tomorrow. She verbalized understanding.

## 2020-02-25 ENCOUNTER — Inpatient Hospital Stay: Payer: Medicare Other

## 2020-02-25 ENCOUNTER — Other Ambulatory Visit: Payer: Self-pay | Admitting: Oncology

## 2020-02-25 ENCOUNTER — Other Ambulatory Visit: Payer: Self-pay

## 2020-02-25 ENCOUNTER — Inpatient Hospital Stay (HOSPITAL_BASED_OUTPATIENT_CLINIC_OR_DEPARTMENT_OTHER): Payer: Medicare Other | Admitting: Medical

## 2020-02-25 VITALS — BP 138/90 | HR 112 | Temp 98.1°F | Resp 18 | Wt 165.0 lb

## 2020-02-25 DIAGNOSIS — Z95828 Presence of other vascular implants and grafts: Secondary | ICD-10-CM

## 2020-02-25 DIAGNOSIS — C679 Malignant neoplasm of bladder, unspecified: Secondary | ICD-10-CM

## 2020-02-25 DIAGNOSIS — Z5112 Encounter for antineoplastic immunotherapy: Secondary | ICD-10-CM | POA: Diagnosis not present

## 2020-02-25 DIAGNOSIS — E860 Dehydration: Secondary | ICD-10-CM | POA: Diagnosis not present

## 2020-02-25 DIAGNOSIS — R197 Diarrhea, unspecified: Secondary | ICD-10-CM

## 2020-02-25 LAB — CBC WITH DIFFERENTIAL (CANCER CENTER ONLY)
Abs Immature Granulocytes: 0 10*3/uL (ref 0.00–0.07)
Band Neutrophils: 3 %
Basophils Absolute: 0 10*3/uL (ref 0.0–0.1)
Basophils Relative: 1 %
Eosinophils Absolute: 0.1 10*3/uL (ref 0.0–0.5)
Eosinophils Relative: 2 %
HCT: 42 % (ref 39.0–52.0)
Hemoglobin: 14.8 g/dL (ref 13.0–17.0)
Lymphocytes Relative: 21 %
Lymphs Abs: 0.5 10*3/uL — ABNORMAL LOW (ref 0.7–4.0)
MCH: 29.4 pg (ref 26.0–34.0)
MCHC: 35.2 g/dL (ref 30.0–36.0)
MCV: 83.3 fL (ref 80.0–100.0)
Monocytes Absolute: 0.5 10*3/uL (ref 0.1–1.0)
Monocytes Relative: 20 %
Neutro Abs: 1.5 10*3/uL — ABNORMAL LOW (ref 1.7–7.7)
Neutrophils Relative %: 53 %
Platelet Count: 254 10*3/uL (ref 150–400)
RBC: 5.04 MIL/uL (ref 4.22–5.81)
RDW: 12 % (ref 11.5–15.5)
WBC Count: 2.6 10*3/uL — ABNORMAL LOW (ref 4.0–10.5)
nRBC: 0 % (ref 0.0–0.2)

## 2020-02-25 LAB — CMP (CANCER CENTER ONLY)
ALT: 47 U/L — ABNORMAL HIGH (ref 0–44)
AST: 26 U/L (ref 15–41)
Albumin: 3.2 g/dL — ABNORMAL LOW (ref 3.5–5.0)
Alkaline Phosphatase: 68 U/L (ref 38–126)
Anion gap: 14 (ref 5–15)
BUN: 39 mg/dL — ABNORMAL HIGH (ref 8–23)
CO2: 24 mmol/L (ref 22–32)
Calcium: 8.8 mg/dL — ABNORMAL LOW (ref 8.9–10.3)
Chloride: 95 mmol/L — ABNORMAL LOW (ref 98–111)
Creatinine: 1.18 mg/dL (ref 0.61–1.24)
GFR, Est AFR Am: >60 mL/min (ref >60)
GFR, Estimated: >60 mL/min (ref >60)
Glucose, Bld: 331 mg/dL — ABNORMAL HIGH (ref 70–99)
Potassium: 4.1 mmol/L (ref 3.5–5.1)
Sodium: 133 mmol/L — ABNORMAL LOW (ref 135–145)
Total Bilirubin: 2.4 mg/dL — ABNORMAL HIGH (ref 0.3–1.2)
Total Protein: 6.6 g/dL (ref 6.5–8.1)

## 2020-02-25 MED ORDER — SODIUM CHLORIDE 0.9 % IV SOLN: 1.2500 mg/kg | Freq: Once | INTRAVENOUS | Status: DC

## 2020-02-25 MED ORDER — DIPHENOXYLATE-ATROPINE 2.5-0.025 MG PO TABS
2.0000 | ORAL_TABLET | Freq: Once | ORAL | Status: DC
  Filled 2020-02-25: qty 2

## 2020-02-25 MED ORDER — SODIUM CHLORIDE 0.9 % IV SOLN
INTRAVENOUS | Status: DC
  Filled 2020-02-25: qty 250

## 2020-02-25 MED ORDER — PROCHLORPERAZINE MALEATE 10 MG PO TABS
ORAL_TABLET | ORAL | Status: AC
  Filled 2020-02-25: qty 1

## 2020-02-25 MED ORDER — SODIUM CHLORIDE 0.9 % IV SOLN
Freq: Once | INTRAVENOUS | Status: DC
  Filled 2020-02-25: qty 250

## 2020-02-25 MED ORDER — SODIUM CHLORIDE 0.9 % IV SOLN
78.0000 mg | Freq: Once | INTRAVENOUS | Status: AC
  Administered 2020-02-25: 78 mg via INTRAVENOUS
  Filled 2020-02-25: qty 7.8

## 2020-02-25 MED ORDER — SODIUM CHLORIDE 0.9 % IV SOLN
10.0000 mg | Freq: Once | INTRAVENOUS | Status: AC
  Administered 2020-02-25: 10 mg via INTRAVENOUS
  Filled 2020-02-25: qty 10

## 2020-02-25 MED ORDER — HEPARIN SOD (PORK) LOCK FLUSH 100 UNIT/ML IV SOLN
500.0000 [IU] | Freq: Once | INTRAVENOUS | Status: AC | PRN
  Administered 2020-02-25: 500 [IU]
  Filled 2020-02-25: qty 5

## 2020-02-25 MED ORDER — DIPHENOXYLATE-ATROPINE 2.5-0.025 MG PO TABS: ORAL_TABLET | ORAL | 2 refills | Status: DC

## 2020-02-25 MED ORDER — SODIUM CHLORIDE 0.9% FLUSH
10.0000 mL | INTRAVENOUS | Status: DC | PRN
  Administered 2020-02-25: 10 mL
  Filled 2020-02-25: qty 10

## 2020-02-25 MED ORDER — SODIUM CHLORIDE 0.9 % IV SOLN
90.0000 mg | Freq: Once | INTRAVENOUS | Status: DC
  Filled 2020-02-25: qty 9

## 2020-02-25 MED ORDER — SODIUM CHLORIDE 0.9 % IV SOLN
Freq: Once | INTRAVENOUS | Status: AC
  Filled 2020-02-25: qty 250

## 2020-02-25 MED ORDER — PROCHLORPERAZINE MALEATE 10 MG PO TABS
10.0000 mg | ORAL_TABLET | Freq: Once | ORAL | Status: AC
  Administered 2020-02-25: 10 mg via ORAL

## 2020-02-25 MED ORDER — DIPHENOXYLATE-ATROPINE 2.5-0.025 MG PO TABS
2.0000 | ORAL_TABLET | Freq: Once | ORAL | Status: AC
  Administered 2020-02-25: 2 via ORAL
  Filled 2020-02-25: qty 2

## 2020-02-25 MED ORDER — SODIUM CHLORIDE 0.9% FLUSH
10.0000 mL | Freq: Once | INTRAVENOUS | Status: AC
  Administered 2020-02-25: 10 mL
  Filled 2020-02-25: qty 10

## 2020-02-25 NOTE — Progress Notes (Signed)
Gluc = 331; likely drug-induced (Padcev) Diarrhea, dehydration. I have d/w Sandi Mealy, PA & Dr. Alen Blew- ok to proceed w/ treatment despite glucose but will reduce Padcev dose to 1 mg/kg.  Kennith Center, Pharm.D., CPP 02/25/2020@11 :40 AM

## 2020-02-25 NOTE — Patient Instructions (Signed)
[place] Discharge Instructions for Patients Receiving Chemotherapy  Today you received the following chemotherapy agent: Enfortumab-vedotin-ejfv (Padcev)  To help prevent nausea and vomiting after your treatment, we encourage you to take your nausea medication as directed by your MD.   If you develop nausea and vomiting that is not controlled by your nausea medication, call the clinic.   BELOW ARE SYMPTOMS THAT SHOULD BE REPORTED IMMEDIATELY:  *FEVER GREATER THAN 100.5 F  *CHILLS WITH OR WITHOUT FEVER  NAUSEA AND VOMITING THAT IS NOT CONTROLLED WITH YOUR NAUSEA MEDICATION  *UNUSUAL SHORTNESS OF BREATH  *UNUSUAL BRUISING OR BLEEDING  TENDERNESS IN MOUTH AND THROAT WITH OR WITHOUT PRESENCE OF ULCERS  *URINARY PROBLEMS  *BOWEL PROBLEMS  UNUSUAL RASH Items with * indicate a potential emergency and should be followed up as soon as possible.  Feel free to call the clinic should you have any questions or concerns. The clinic phone number is [phone].  Please show the [place] [facility] at check-in to the Emergency Department and triage nurse.   Diabetes Mellitus and Nutrition, Adult When you have diabetes (diabetes mellitus), it is very important to have healthy eating habits because your blood sugar (glucose) levels are greatly affected by what you eat and drink. Eating healthy foods in the appropriate amounts, at about the same times every day, can help you:  Control your blood glucose.  Lower your risk of heart disease.  Improve your blood pressure.  Reach or maintain a healthy weight. Every person with diabetes is different, and each person has different needs for a meal plan. Your health care provider may recommend that you work with a diet and nutrition specialist (dietitian) to make a meal plan that is best for you. Your meal plan may vary depending on factors such as:  The calories you need.  The medicines you take.  Your weight.  Your blood  glucose, blood pressure, and cholesterol levels.  Your activity level.  Other health conditions you have, such as heart or kidney disease. How do carbohydrates affect me? Carbohydrates, also called carbs, affect your blood glucose level more than any other type of food. Eating carbs naturally raises the amount of glucose in your blood. Carb counting is a method for keeping track of how many carbs you eat. Counting carbs is important to keep your blood glucose at a healthy level, especially if you use insulin or take certain oral diabetes medicines. It is important to know how many carbs you can safely have in each meal. This is different for every person. Your dietitian can help you calculate how many carbs you should have at each meal and for each snack. Foods that contain carbs include:  Bread, cereal, rice, pasta, and crackers.  Potatoes and corn.  Peas, beans, and lentils.  Milk and yogurt.  Fruit and juice.  Desserts, such as cakes, cookies, ice cream, and candy. How does alcohol affect me? Alcohol can cause a sudden decrease in blood glucose (hypoglycemia), especially if you use insulin or take certain oral diabetes medicines. Hypoglycemia can be a life-threatening condition. Symptoms of hypoglycemia (sleepiness, dizziness, and confusion) are similar to symptoms of having too much alcohol. If your health care provider says that alcohol is safe for you, follow these guidelines:  Limit alcohol intake to no more than 1 drink per day for nonpregnant women and 2 drinks per day for men. One drink equals 12 oz of beer, 5 oz of wine, or 1 oz of hard liquor.  Do not drink on an empty stomach.  Keep yourself hydrated with water, diet soda, or unsweetened iced tea.  Keep in mind that regular soda, juice, and other mixers may contain a lot of sugar and must be counted as carbs. What are tips for following this plan?  Reading food labels  Start by checking the serving size on the  "Nutrition Facts" label of packaged foods and drinks. The amount of calories, carbs, fats, and other nutrients listed on the label is based on one serving of the item. Many items contain more than one serving per package.  Check the total grams (g) of carbs in one serving. You can calculate the number of servings of carbs in one serving by dividing the total carbs by 15. For example, if a food has 30 g of total carbs, it would be equal to 2 servings of carbs.  Check the number of grams (g) of saturated and trans fats in one serving. Choose foods that have low or no amount of these fats.  Check the number of milligrams (mg) of salt (sodium) in one serving. Most people should limit total sodium intake to less than 2,300 mg per day.  Always check the nutrition information of foods labeled as "low-fat" or "nonfat". These foods may be higher in added sugar or refined carbs and should be avoided.  Talk to your dietitian to identify your daily goals for nutrients listed on the label. Shopping  Avoid buying canned, premade, or processed foods. These foods tend to be high in fat, sodium, and added sugar.  Shop around the outside edge of the grocery store. This includes fresh fruits and vegetables, bulk grains, fresh meats, and fresh dairy. Cooking  Use low-heat cooking methods, such as baking, instead of high-heat cooking methods like deep frying.  Cook using healthy oils, such as olive, canola, or sunflower oil.  Avoid cooking with butter, cream, or high-fat meats. Meal planning  Eat meals and snacks regularly, preferably at the same times every day. Avoid going long periods of time without eating.  Eat foods high in fiber, such as fresh fruits, vegetables, beans, and whole grains. Talk to your dietitian about how many servings of carbs you can eat at each meal.  Eat 4-6 ounces (oz) of lean protein each day, such as lean meat, chicken, fish, eggs, or tofu. One oz of lean protein is equal  to: ? 1 oz of meat, chicken, or fish. ? 1 egg. ?  cup of tofu.  Eat some foods each day that contain healthy fats, such as avocado, nuts, seeds, and fish. Lifestyle  Check your blood glucose regularly.  Exercise regularly as told by your health care provider. This may include: ? 150 minutes of moderate-intensity or vigorous-intensity exercise each week. This could be brisk walking, biking, or water aerobics. ? Stretching and doing strength exercises, such as yoga or weightlifting, at least 2 times a week.  Take medicines as told by your health care provider.  Do not use any products that contain nicotine or tobacco, such as cigarettes and e-cigarettes. If you need help quitting, ask your health care provider.  Work with a Social worker or diabetes educator to identify strategies to manage stress and any emotional and social challenges. Questions to ask a health care provider  Do I need to meet with a diabetes educator?  Do I need to meet with a dietitian?  What number can I call if I have questions?  When are the best times to  check my blood glucose? Where to find more information:  American Diabetes Association: diabetes.org  Academy of Nutrition and Dietetics: www.eatright.[employer] of Diabetes and Digestive and Kidney Diseases (NIH): [employer] Summary  A healthy meal plan will help you control your blood glucose and maintain a healthy lifestyle.  Working with a diet and nutrition specialist (dietitian) can help you make a meal plan that is best for you.  Keep in mind that carbohydrates (carbs) and alcohol have immediate effects on your blood glucose levels. It is important to count carbs and to use alcohol carefully. This information is not intended to replace advice given to you by your health care provider. Make sure you discuss any questions you have with your health care provider. Document Revised: 08/01/2017 Document Reviewed: 09/23/2016 Elsevier  Patient Education  [place].   Protein Content in Foods  Generally, most healthy people need around 50 grams of protein each day. Depending on your overall health, you may need more or less protein in your diet. Talk to your health care provider or dietitian about how much protein you need. See the following list for the protein content of some common foods. High-protein foods High-protein foods contain 4 grams (4 g) or more of protein per serving. They include:  Beef, ground sirloin (cooked) -- 3 oz have 24 g of protein.  Cheese (hard) -- 1 oz has 7 g of protein.  Chicken breast, boneless and skinless (cooked) -- 3 oz have 13.4 g of protein.  Cottage cheese -- 1/2 cup has 13.4 g of protein.  Egg -- 1 egg has 6 g of protein.  Fish, filet (cooked) -- 1 oz has 6-7 g of protein.  Garbanzo beans (canned or cooked) -- 1/2 cup has 6-7 g of protein.  Kidney beans (canned or cooked) -- 1/2 cup has 6-7 g of protein.  Lamb (cooked) -- 3 oz has 24 g of protein.  Milk -- 1 cup (8 oz) has 8 g of protein.  Nuts (peanuts, pistachios, almonds) -- 1 oz has 6 g of protein.  Peanut butter -- 1 oz has 7-8 g of protein.  Pork tenderloin (cooked) -- 3 oz has 18.4 g of protein.  Pumpkin seeds -- 1 oz has 8.5 g of protein.  Soybeans (roasted) -- 1 oz has 8 g of protein.  Soybeans (cooked) -- 1/2 cup has 11 g of protein.  Soy milk -- 1 cup (8 oz) has 5-10 g of protein.  Soy or vegetable patty -- 1 patty has 11 g of protein.  Sunflower seeds -- 1 oz has 5.5 g of protein.  Tofu (firm) -- 1/2 cup has 20 g of protein.  Tuna (canned in water) -- 3 oz has 20 g of protein.  Yogurt -- 6 oz has 8 g of protein. Low-protein foods Low-protein foods contain 3 grams (3 g) or less of protein per serving. They include:  Beets (raw or cooked) -- 1/2 cup has 1.5 g of protein.  Bran cereal -- 1/2 cup has 2-3 g of protein.  Bread -- 1 slice has 2.5 g of protein.  Broccoli (raw or cooked)  -- 1/2 cup has 2 g of protein.  Collard greens (raw or cooked) -- 1/2 cup has 2 g of protein.  Corn (fresh or cooked) -- 1/2 cup has 2 g of protein.  Cream cheese -- 1 oz has 2 g of protein.  Creamer (half-and-half) -- 1 oz has 1 g of protein.  Flour tortilla --  1 tortilla has 2.5 g of protein  Frozen yogurt -- 1/2 cup has 3 g of protein.  Fruit or vegetable juice -- 1/2 cup has 1 g of protein.  Green beans (raw or cooked) -- 1/2 cup has 1 g of protein.  Green peas (canned) -- 1/2 cup has 3.5 g of protein.  Muffins -- 1 small muffin (2 oz) has 3 g of protein.  Oatmeal (cooked) -- 1/2 cup has 3 g of protein.  Potato (baked with skin) -- 1 medium potato has 3 g of protein.  Rice (cooked) -- 1/2 cup has 2.5-3.5 g of protein.  Sour cream -- 1/2 cup has 2.5 g of protein.  Spinach (cooked) -- 1/2 cup has 3 g of protein.  Squash (cooked) -- 1/2 cup has 1.5 g of protein. Actual amounts of protein may be different depending on processing. Talk with your health care provider or dietitian about what foods are recommended for you. This information is not intended to replace advice given to you by your health care provider. Make sure you discuss any questions you have with your health care provider. Document Revised: 04/29/2016 Document Reviewed: 04/29/2016 Elsevier Patient Education  2020 [employer].

## 2020-02-25 NOTE — Progress Notes (Signed)
[facility] Progress Note   Danny Wood [id number] October 21, 1950 69 y.o.  Danny Wood is managed by Dr. Alen Blew  Actively treated with chemotherapy/immunotherapy/hormonal therapy: yes  Current therapy: Padcev  Last treated: 02/11/20 (cycle 1, day 1)  Next scheduled appointment with provider: 03/02/2020  Assessment: Plan:    Diarrhea, unspecified type - Plan: diphenoxylate-atropine (LOMOTIL) 2.5-0.025 MG tablet, 0.9 %  sodium chloride infusion, DISCONTINUED: diphenoxylate-atropine (LOMOTIL) 2.5-0.025 MG per tablet 2 tablet  Dehydration - Plan: 0.9 %  sodium chloride infusion  Malignant neoplasm of urinary bladder, unspecified site (HCC)   Diarrhea: The patient was given Lomotil while here and was also given a prescription for Lomotil.  Dehydration: The patient will return on tomorrow and Monday for 1 liter of normal saline.  Metastatic urinary bladder cancer: We will proceed with cycle 1, day 15 of  Padcev today.  He will see Dr. Alen Blew next on 03/02/2020.   Please see After Visit Summary for patient specific instructions.  Future Appointments  Date Time Provider [place]  03/02/2020  1:00 PM [facility] 5 [facility] None  03/02/2020  1:30 PM Wyatt Portela, MD [facility] None  03/10/2020  9:45 AM [facility]FLUSH [facility] None  03/10/2020 10:00 AM [facility] [place] FLUSH [facility] None  03/10/2020 10:30 AM [facility] INFUSION [facility] None  03/10/2020 10:30 AM Neff, Barbara L, RD [facility] None  03/17/2020 11:00 AM [facility]FLUSH [facility] None  03/17/2020 11:15 AM [facility] [place] FLUSH [facility] None  03/17/2020 12:30 PM [facility] INFUSION [facility] None  03/24/2020 11:15 AM [facility]FLUSH [facility] None  03/24/2020 11:30 AM [facility] [place] FLUSH [facility] None  03/24/2020 12:30 PM [facility] INFUSION [facility] None    No orders of the defined types were placed in this encounter.      Subjective:   Patient ID:   Danny Wood is a 69 y.o. (DOB 1951-02-18) male.  Chief Complaint:  No chief complaint on file.   HPI Danny Wood is a 69 year old male with a diagnosis of a metastatic bladder cancer who is managed by Dr. Alen Blew is status post cycle 1, day 1 of Padcev which was dosed on 02/11/2020.  The patient's wife called to report that the patient has not eaten in 3 weeks and is experiencing severe diarrhea. He had not tried anything to control his diarrhea until yesterday when his wife bought Imodium. His pain is well controlled since beginning a Duragesic patch and Cymbalta.  Medications: I have reviewed the patient's current medications.  Allergies: No Known Allergies  Past Medical History:  Diagnosis Date  . At risk for sleep apnea    STOP-BANG= 4       SENT TO PCP 05-16-2014  . bladder ca dx'd 05/17/14   met dz 12/2017  . Bladder disorder   . Hematuria   . History of renal cell carcinoma    2003  S/P  PARTIAL RIGHT NEPHRECTOMY  . Presence of surgical incision    lumbar diskectomy 05-11-2014-  bandage present  . Urgency of urination     Past Surgical History:  Procedure Laterality Date  . CYSTOSCOPY N/A 09/09/2014   Procedure: CYSTOSCOPY WITH INDOCYANINE GREEN DYE INJECTION AND URETHRAL DILATION;  Surgeon: Alexis Frock, MD;  Location: [facility];  Service: Urology;  Laterality: N/A;  . CYSTOSCOPY WITH BIOPSY N/A 05/17/2014   Procedure: CYSTO WITH BLADDER BIOPSY;  Surgeon: Malka So, MD;  Location: [facility]  Service: Urology;  Laterality: N/A;  . EVALUATION UNDER ANESTHESIA WITH  HEMORRHOIDECTOMY  07/07/2012   Procedure: EXAM UNDER ANESTHESIA WITH HEMORRHOIDECTOMY;  Surgeon: Joyice Faster. Cornett, MD;  Location: [facility];  Service: General;  Laterality: N/A;  . IR IMAGING GUIDED PORT INSERTION  02/20/2018  . LUMBAR MICRODISCECTOMY  05-11-2014   L4 -- L5 (partial)  . PARTIAL NEPHRECTOMY Right 2003  . ROBOT ASSISTED LAPAROSCOPIC COMPLETE CYSTECT ILEAL CONDUIT N/A  09/09/2014   Procedure: ROBOTIC ASSISTED LAPAROSCOPIC COMPLETE CYSTOPROSTATECTOMY,  ILEAL CONDUIT;  Surgeon: Alexis Frock, MD;  Location: [facility];  Service: Urology;  Laterality: N/A;    Family History  Problem Relation Age of Onset  . Diabetes Neg Hx     Social History   Socioeconomic History  . Marital status: Married    Spouse name: Not on file  . Number of children: Not on file  . Years of education: Not on file  . Highest education level: Not on file  Occupational History  . Not on file  Tobacco Use  . Smoking status: Former Smoker    Packs/day: 0.50    Years: 25.00    Pack years: 12.50    Types: Cigarettes    Quit date: 06/02/2004    Years since quitting: 15.7  . Smokeless tobacco: Never Used  Substance and Sexual Activity  . Alcohol use: Yes    Alcohol/week: 2.0 standard drinks    Types: 2 Standard drinks or equivalent per week  . Drug use: No  . Sexual activity: Not on file  Other Topics Concern  . Not on file  Social History Narrative  . Not on file   Social Determinants of Health   Financial Resource Strain:   . Difficulty of Paying Living Expenses:   Food Insecurity:   . Worried About Charity fundraiser in the Last Year:   . Arboriculturist in the Last Year:   Transportation Needs:   . Film/video editor (Medical):   Marland Kitchen Lack of Transportation (Non-Medical):   Physical Activity:   . Days of Exercise per Week:   . Minutes of Exercise per Session:   Stress:   . Feeling of Stress :   Social Connections:   . Frequency of Communication with Friends and Family:   . Frequency of Social Gatherings with Friends and Family:   . Attends Religious Services:   . Active Member of Clubs or Organizations:   . Attends Archivist Meetings:   Marland Kitchen Marital Status:   Intimate Partner Violence:   . Fear of Current or Ex-Partner:   . Emotionally Abused:   Marland Kitchen Physically Abused:   . Sexually Abused:     Past Medical History, Surgical history, Social  history, and Family history were reviewed and updated as appropriate.   Please see review of systems for further details on the patient's review from today.   Review of Systems:  Review of Systems  Constitutional: Positive for appetite change. Negative for activity change, chills, diaphoresis and fever.  HENT: Negative for trouble swallowing.   Respiratory: Negative for cough, chest tightness and shortness of breath.   Cardiovascular: Negative for chest pain, palpitations and leg swelling.  Gastrointestinal: Positive for diarrhea. Negative for abdominal distention, abdominal pain, constipation, nausea and vomiting.    Objective:   Physical Exam:  There were no vitals taken for this visit. ECOG: 1  Physical Exam Constitutional:      General: He is not in acute distress.    Appearance: Normal appearance. He is not ill-appearing, toxic-appearing or diaphoretic.  HENT:  Head: Normocephalic and atraumatic.  Eyes:     General: No scleral icterus.       Right eye: No discharge.        Left eye: No discharge.     Conjunctiva/sclera: Conjunctivae normal.  Cardiovascular:     Rate and Rhythm: Normal rate and regular rhythm.     Heart sounds: No murmur heard.  No friction rub. No gallop.   Pulmonary:     Effort: Pulmonary effort is normal. No respiratory distress.     Breath sounds: Normal breath sounds. No wheezing, rhonchi or rales.  Abdominal:     General: Bowel sounds are normal. There is no distension.     Tenderness: There is no abdominal tenderness. There is no guarding.  Neurological:     Mental Status: He is alert.     Lab Review:     Component Value Date/Time   NA 133 (L) 02/25/2020 0932   NA 139 08/05/2014 1335   K 4.1 02/25/2020 0932   K 4.3 08/05/2014 1335   CL 95 (L) 02/25/2020 0932   CO2 24 02/25/2020 0932   CO2 26 08/05/2014 1335   GLUCOSE 331 (H) 02/25/2020 0932   GLUCOSE 97 08/05/2014 1335   BUN 39 (H) 02/25/2020 0932   BUN 18.2 08/05/2014 1335    CREATININE 1.18 02/25/2020 0932   CREATININE 1.1 08/05/2014 1335   CALCIUM 8.8 (L) 02/25/2020 0932   CALCIUM 9.5 08/05/2014 1335   PROT 6.6 02/25/2020 0932   PROT 6.5 08/05/2014 1335   ALBUMIN 3.2 (L) 02/25/2020 0932   ALBUMIN 3.9 08/05/2014 1335   AST 26 02/25/2020 0932   AST 45 (H) 08/05/2014 1335   ALT 47 (H) 02/25/2020 0932   ALT 73 (H) 08/05/2014 1335   ALKPHOS 68 02/25/2020 0932   ALKPHOS 98 08/05/2014 1335   BILITOT 2.4 (H) 02/25/2020 0932   BILITOT 0.47 08/05/2014 1335   GFRNONAA >60 02/25/2020 0932   GFRAA >60 02/25/2020 0932       Component Value Date/Time   WBC 2.6 (L) 02/25/2020 0932   WBC 6.3 07/22/2018 1256   RBC 5.04 02/25/2020 0932   HGB 14.8 02/25/2020 0932   HGB 11.8 (L) 08/05/2014 1335   HCT 42.0 02/25/2020 0932   HCT 33.1 (L) 08/05/2014 1335   PLT 254 02/25/2020 0932   PLT 305 08/05/2014 1335   MCV 83.3 02/25/2020 0932   MCV 81.9 08/05/2014 1335   MCH 29.4 02/25/2020 0932   MCHC 35.2 02/25/2020 0932   RDW 12.0 02/25/2020 0932   RDW 13.7 08/05/2014 1335   LYMPHSABS 0.5 (L) 02/25/2020 0932   LYMPHSABS 1.9 08/05/2014 1335   MONOABS 0.5 02/25/2020 0932   MONOABS 1.0 (H) 08/05/2014 1335   EOSABS 0.1 02/25/2020 0932   EOSABS 0.1 08/05/2014 1335   BASOSABS 0.0 02/25/2020 0932   BASOSABS 0.0 08/05/2014 1335   -------------------------------  Imaging from last 24 hours (if applicable):  Radiology interpretation: CT Chest W Contrast  Result Date: 02/24/2020 CLINICAL DATA:  Follow-up urothelial carcinoma. Status post Cysto prostatectomy and nodal dissection. EXAM: CT CHEST WITH CONTRAST TECHNIQUE: Multidetector CT imaging of the chest was performed during intravenous contrast administration. CONTRAST:  31m OMNIPAQUE IOHEXOL 300 MG/ML  SOLN COMPARISON:  CT AP 02/08/20 CT chest 09/17/2019 FINDINGS: Cardiovascular: The heart size appears normal. Aortic atherosclerosis. Lad and left circumflex coronary artery calcifications. Mediastinum/Nodes: Normal  appearance of the thyroid gland. The trachea appears patent and is midline. Normal appearance of the esophagus. No enlarged axillary, supraclavicular,  mediastinal, or hilar adenopathy. Lungs/Pleura: No pleural effusion. Previously noted cavitating nodule within the right lung apex has decreased in size in the interval. On today's study this measures 1.2 x 0.5 cm, image 26/5. New thin walled cyst within the medial right apex likely reflects a post inflammatory/infectious pneumatocele. Surrounding ground-glass attenuation compatible with residual changes due to inflammation/infection. Tiny nodule within the left apex measures 3 mm and is unchanged, image 20/5. Calcified perifissural nodule is identified in the superior segment of right lower lobe. Tiny nonspecific left upper lobe lung nodule is unchanged measuring 2 mm. Upper Abdomen: No acute findings identified within the upper abdomen. Upper abdominal adenopathy is identified. Again seen is tumor metastasis within the upper abdomen extending from the porta hepatis along the celiac trunk and common hepatic artery origin. Small volume of perihepatic ascites is new from previous exam. Musculoskeletal: No chest wall abnormality. No acute or significant osseous findings. IMPRESSION: 1. Interval decrease in size of right apical cavitary nodule with surrounding inflammatory changes. Favor inflammatory/infectious process 2. No specific findings identified to suggest metastatic disease within the chest. Scattered tiny lung nodules measuring 3 mm or less are unchanged in the interval. 3. Similar appearance of nodal metastasis within the upper abdomen. Electronically Signed   By: Kerby Moors M.D.   On: 02/24/2020 15:06   CT Abdomen Pelvis W Contrast  Result Date: 02/08/2020 CLINICAL DATA:  Invasive bladder cancer restaging, prior cysto prostatectomy. Ongoing chemotherapy. Umbilical and epigastric pain for 3 weeks with reduced appetite. Weight loss. EXAM: CT ABDOMEN AND  PELVIS WITH CONTRAST TECHNIQUE: Multidetector CT imaging of the abdomen and pelvis was performed using the standard protocol following bolus administration of intravenous contrast. CONTRAST:  132m OMNIPAQUE IOHEXOL 300 MG/ML  SOLN COMPARISON:  CT scan 09/17/2019 FINDINGS: Lower chest: Descending thoracic aortic atherosclerotic calcification. Hepatobiliary: Unremarkable Pancreas: Unremarkable Spleen: Unremarkable Adrenals/Urinary Tract: Both adrenal glands appear normal. Scarring in the right mid kidney, stable. Left mid kidney cyst posteriorly, benign appearance. Faint abnormal hypoenhancement in the right kidney lower pole on portal venous and delayed phase images, for example image 46/2, infiltrative process such as pyelonephritis cannot be excluded. Cystoprostatectomy with ileal conduit. No hydronephrosis. No hydroureter. No appreciable filling defect along the urothelium. Stomach/Bowel: Unremarkable Vascular/Lymphatic: Progressive adenopathy compatible with malignancy. In particular, there is indistinctly marginated tumor extending from the porta hepatis along the celiac trunk and common hepatic artery region, measuring approximately 4.5 by 2.6 by 2.8 cm on image 26/2. Along the distal in the SMA in the root of the mesentery, a 3.3 by 2.4 by 4.0 cm tumor mass has substantially increased from prior. Progressive and pathologic retroperitoneal and pelvic adenopathy noted. Index left periaortic lymph node just below the renal vessels measures 1.4 cm in short axis on image 39/2, previously 0.8 cm by my measurements. Index right inguinal node 1.8 cm in short axis on image 84/2, formerly 1.3 cm. Left inguinal lymph nodes are also prominent. Reproductive: Cystoprostatectomy. Other: Small but increased amount of ascites including along the right paracolic gutter and in the lower anatomic pelvis. Musculoskeletal: Lumbar spondylosis and degenerative disc disease causing generally mild degrees of impingement at L2-3 and  L3-4. IMPRESSION: 1. Worsening retroperitoneal and pelvic adenopathy compatible with progressive malignancy. Progressive mesenteric lesions adjacent to the celiac trunk and along the distal SMA compatible with malignancy. 2. Mild but abnormal hypoenhancement in the right kidney lower pole on portal venous and delayed phase images, possibly reflecting pyelonephritis. 3. Small but increased amount of ascites. 4. Other imaging  findings of potential clinical significance: Lumbar spondylosis and degenerative disc disease causing generally mild degrees of impingement at L2-3 and L3-4. Aortic atherosclerosis. Aortic Atherosclerosis (ICD10-I70.0). Electronically Signed   By: Van Clines M.D.   On: 02/08/2020 13:03        This case was discussed Dr. Alen Blew. He expressed agreement with my management of this patient.  Oncology addendum:  Patient was seen and examined personally.  69 year old man with advanced urothelial carcinoma of the bladder presented with worsening disease progression on imaging studies.  He reports show worsening diarrhea and overall fatigue and dehydration.  On exam he was chronically ill-appearing although without distress.  Skin was dry slightly tachycardic.  Laboratory data reviewed which showed increase his BUN and creatinine consistent with prerenal azotemia.  I discussed with the patient importance of continuing his chemotherapy as his only option to decrease the cancer growth and improve his quality of life.  He is agreeable to proceed and will continue with aggressive hydration periodically to support him so the effect of chemotherapy on his cancer is appreciable.  He is agreeable with this plan we will continue to monitor closely.

## 2020-02-25 NOTE — Progress Notes (Signed)
Pt seen by V Tanner. All labs and Vital signs reviewed.OK to treat per Sandi Mealy PA.

## 2020-02-25 NOTE — Progress Notes (Signed)
Dose clarification: Padcev dose today will be 78 mg.  Wt used = 74.8 kg (today's wt). Was not able to round to 80 mg (within 10%). Will monitor wt closely and round to vial size if appropriate w/ subsequent doses.  Kennith Center, Pharm.D., CPP 02/25/2020@1 :08 PM

## 2020-02-26 ENCOUNTER — Other Ambulatory Visit: Payer: Self-pay

## 2020-02-26 ENCOUNTER — Inpatient Hospital Stay: Payer: Medicare Other

## 2020-02-26 VITALS — BP 159/90 | HR 106 | Temp 98.2°F | Resp 20

## 2020-02-26 DIAGNOSIS — C679 Malignant neoplasm of bladder, unspecified: Secondary | ICD-10-CM

## 2020-02-26 DIAGNOSIS — Z95828 Presence of other vascular implants and grafts: Secondary | ICD-10-CM

## 2020-02-26 DIAGNOSIS — Z5112 Encounter for antineoplastic immunotherapy: Secondary | ICD-10-CM | POA: Diagnosis not present

## 2020-02-26 DIAGNOSIS — E860 Dehydration: Secondary | ICD-10-CM

## 2020-02-26 MED ORDER — HEPARIN SOD (PORK) LOCK FLUSH 100 UNIT/ML IV SOLN
500.0000 [IU] | Freq: Once | INTRAVENOUS | Status: AC
  Administered 2020-02-26: 500 [IU]
  Filled 2020-02-26: qty 5

## 2020-02-26 MED ORDER — SODIUM CHLORIDE 0.9 % IV SOLN
Freq: Once | INTRAVENOUS | Status: AC
  Filled 2020-02-26: qty 250

## 2020-02-26 MED ORDER — SODIUM CHLORIDE 0.9% FLUSH
10.0000 mL | Freq: Once | INTRAVENOUS | Status: AC
  Administered 2020-02-26: 10 mL
  Filled 2020-02-26: qty 10

## 2020-02-26 NOTE — Patient Instructions (Signed)

## 2020-02-28 ENCOUNTER — Inpatient Hospital Stay: Payer: Medicare Other

## 2020-02-28 ENCOUNTER — Ambulatory Visit: Payer: PRIVATE HEALTH INSURANCE | Admitting: Nutrition

## 2020-02-28 ENCOUNTER — Other Ambulatory Visit: Payer: Self-pay

## 2020-02-28 VITALS — BP 139/86 | HR 104 | Temp 97.9°F | Resp 18

## 2020-02-28 DIAGNOSIS — Z5112 Encounter for antineoplastic immunotherapy: Secondary | ICD-10-CM | POA: Diagnosis not present

## 2020-02-28 DIAGNOSIS — C679 Malignant neoplasm of bladder, unspecified: Secondary | ICD-10-CM

## 2020-02-28 DIAGNOSIS — E860 Dehydration: Secondary | ICD-10-CM

## 2020-02-28 DIAGNOSIS — Z95828 Presence of other vascular implants and grafts: Secondary | ICD-10-CM

## 2020-02-28 MED ORDER — HEPARIN SOD (PORK) LOCK FLUSH 10 UNIT/ML IV SOLN: 10.0000 [IU] | Freq: Once | INTRAVENOUS | Status: DC

## 2020-02-28 MED ORDER — SODIUM CHLORIDE 0.9% FLUSH
10.0000 mL | INTRAVENOUS | Status: DC | PRN
  Administered 2020-02-28: 10 mL via INTRAVENOUS
  Filled 2020-02-28: qty 10

## 2020-02-28 MED ORDER — SODIUM CHLORIDE 0.9% FLUSH
10.0000 mL | Freq: Once | INTRAVENOUS | Status: AC
  Administered 2020-02-28: 10 mL
  Filled 2020-02-28: qty 10

## 2020-02-28 MED ORDER — HEPARIN SOD (PORK) LOCK FLUSH 100 UNIT/ML IV SOLN
500.0000 [IU] | Freq: Once | INTRAVENOUS | Status: AC
  Administered 2020-02-28: 500 [IU]
  Filled 2020-02-28: qty 5

## 2020-02-28 MED ORDER — ONDANSETRON HCL 4 MG/2ML IJ SOLN
INTRAMUSCULAR | Status: AC
  Filled 2020-02-28: qty 4

## 2020-02-28 MED ORDER — SODIUM CHLORIDE 0.9 % IV SOLN
Freq: Once | INTRAVENOUS | Status: AC
  Filled 2020-02-28: qty 250

## 2020-02-28 MED ORDER — ONDANSETRON HCL 4 MG/2ML IJ SOLN
8.0000 mg | Freq: Once | INTRAMUSCULAR | Status: AC
  Administered 2020-02-28: 8 mg via INTRAVENOUS

## 2020-02-28 NOTE — Patient Instructions (Signed)
Full Liquid Diet A full liquid diet refers to fluids and foods that are liquid or will become liquid at room temperature. This diet should only be used for a short period of time to help you recover from illness or surgery. Your health care provider or dietitian will help you determine when it is safe to eat regular foods. What are tips for following this plan?     Reading food labels  Check food labels of nutrition shakes for the amount of protein. Look for nutrition shakes that have at least 8-10 grams of protein in each serving.  Look for drinks, such as milks and juices, that are "fortified" or "enriched." This means that vitamins and minerals have been added. Shopping  Buy premade nutritional shakes to keep on hand.  To vary your choices, buy different flavors of milks and shakes. Meal planning  Choose flavors and foods that you enjoy.  To make sure you get enough energy from food (calories): ? Eat 3 full liquid meals each day. Have a liquid snack between each meal. ? Drink 6-8 ounces (177-237 ml) of a nutrition supplement shake with meals or as snacks. ? Add protein powder, powdered milk, milk, or yogurt to shakes to increase the amount of protein.  Drink at least one serving a day of citrus fruit juice or fruit juice that has vitamin C added. General guidelines  Before starting the full-liquid diet, check with your health care provider to know what foods you should avoid. These may include full-fat or high-fiber liquids.  You may have any liquid or food that becomes a liquid at room temperature. The food is considered a liquid if it can be poured off a spoon at room temperature.  Do not drink alcohol unless approved by your health care provider.  This diet gives you most of the nutrients that you need for energy, but you may not get enough of certain vitamins, minerals, and fiber. Make sure to talk to your health care provider or dietitian about: ? How many calories you need  to eat get day. ? How much fluid you should have each day. ? Taking a multivitamin or a nutritional supplement. What foods are allowed? The items listed may not be a complete list. Talk with your dietitian about what dietary choices are best for you. Grains Thin hot cereal, such as cream of wheat. Soft-cooked pasta or rice pured in soup. Vegetables Pulp-free tomato or vegetable juice. Vegetables pured in soup. Fruits Fruit juice without pulp. Strained fruit pures (seeds and skins removed). Meats and other protein foods Beef, chicken, and fish broths. Powdered protein supplements. Dairy Milk and milk-based beverages, including milk shakes and instant breakfast mixes. Smooth yogurt. Pured cottage cheese. Beverages Water. Coffee and tea (caffeinated or decaffeinated). Cocoa. Liquid nutritional supplements. Soft drinks. Nondairy milks, such as almond, coconut, rice, or soy milk. Fats and oils Melted margarine and butter. Cream. Canola, almond, avocado, corn, grapeseed, sunflower, and sesame oils. Gravy. Sweets and desserts Custard. Pudding. Flavored gelatin. Smooth ice cream (without nuts or candy pieces). Sherbet. Popsicles. Italian ice. Pudding pops. Seasoning and other foods Salt and pepper. Spices. Cocoa powder. Vinegar. Ketchup. Yellow mustard. Smooth sauces, such as [place]aise, cheese sauce, or white sauce. Soy sauce. Cream soups. Strained soups. Syrup. Honey. Jelly (without fruit pieces). What foods are not allowed? The items listed may not be a complete list. Talk with your dietitian about what dietary choices are best for you. Grains Whole grains. Pasta. Rice. Cold cereal. Bread. Crackers.   Vegetables All whole fresh, frozen, or canned vegetables. Fruits All whole fresh, frozen, or canned fruits. Meats and other protein foods All cuts of meat, poultry, and fish. Eggs. Tofu and soy protein. Nuts and nut butters. Lunch meat. Sausage. Dairy Hard cheese. Yogurt with fruit  chunks. Fats and oils Coconut oil. Palm oil. Lard. Cold butter. Sweets and desserts Ice cream or other frozen desserts that have any solids in them or on top, such as nuts, chocolate chips, and pieces of cookies. Cakes. Cookies. Candy. Seasoning and other foods Stone-ground mustards. Soups with chunks or pieces. Summary  A full liquid diet refers to fluids and foods that are liquid or will become liquid at room temperature.  This diet should only be used for a short period of time to help you recover from illness or surgery. Ask your health care provider or dietitian when it is safe for you to eat regular foods.  To make sure you get enough calories and nutrients, eat 3 meals each day with snacks between. Drink premade nutrition supplement shakes or add protein powder to homemade shakes. Take a vitamin and mineral supplement as told by your health care provider. This information is not intended to replace advice given to you by your health care provider. Make sure you discuss any questions you have with your health care provider. Document Revised: 11/15/2017 Document Reviewed: 10/02/2016 Elsevier Patient Education  [place]. Nausea, Adult Nausea is the feeling that you have an upset stomach or that you are about to vomit. Nausea on its own is not usually a serious concern, but it may be an early sign of a more serious medical problem. As nausea gets worse, it can lead to vomiting. If vomiting develops, or if you are not able to drink enough fluids, you are at risk of becoming dehydrated. Dehydration can make you tired and thirsty, cause you to have a dry mouth, and decrease how often you urinate. Older adults and people with other diseases or a weak disease-fighting system (immune system) are at higher risk for dehydration. The main goals of treating your nausea are:  To relieve your nausea.  To limit repeated nausea episodes.  To prevent vomiting and dehydration. Follow these  instructions at home: Watch your symptoms for any changes. Tell your health care provider about them. Follow these instructions as told by your health care provider. Eating and drinking      Take an oral rehydration solution (ORS). This is a drink that is sold at pharmacies and retail stores.  Drink clear fluids slowly and in small amounts as you are able. Clear fluids include water, ice chips, low-calorie sports drinks, and fruit juice that has water added (diluted fruit juice).  Eat bland, easy-to-digest foods in small amounts as you are able. These foods include bananas, applesauce, rice, lean meats, toast, and crackers.  Avoid drinking fluids that contain a lot of sugar or caffeine, such as energy drinks, sports drinks, and soda.  Avoid alcohol.  Avoid spicy or fatty foods. General instructions  Take over-the-counter and prescription medicines only as told by your health care provider.  Rest at home while you recover.  Drink enough fluid to keep your urine pale yellow.  Breathe slowly and deeply when you feel nauseous.  Avoid smelling things that have strong odors.  Wash your hands often using soap and water. If soap and water are not available, use hand sanitizer.  Make sure that all people in your household wash their hands well  and often.  Keep all follow-up visits as told by your health care provider. This is important. Contact a health care provider if:  Your nausea gets worse.  Your nausea does not go away after two days.  You vomit.  You cannot drink fluids without vomiting.  You have any of the following: ? New symptoms. ? A fever. ? A headache. ? Muscle cramps. ? A rash. ? Pain while urinating.  You feel light-headed or dizzy. Get help right away if:  You have pain in your chest, neck, arm, or jaw.  You feel extremely weak or you faint.  You have vomit that is bright red or looks like coffee grounds.  You have bloody or black stools or stools  that look like tar.  You have a severe headache, a stiff neck, or both.  You have severe pain, cramping, or bloating in your abdomen.  You have difficulty breathing or are breathing very quickly.  Your heart is beating very quickly.  Your skin feels cold and clammy.  You feel confused.  You have signs of dehydration, such as: ? Dark urine, very little urine, or no urine. ? Cracked lips. ? Dry mouth. ? Sunken eyes. ? Sleepiness. ? Weakness. These symptoms may represent a serious problem that is an emergency. Do not wait to see if the symptoms will go away. Get medical help right away. Call your local emergency services (911 in the [place] Do not drive yourself to the hospital. Summary  Nausea is the feeling that you have an upset stomach or that you are about to vomit. Nausea on its own is not usually a serious concern, but it may be an early sign of a more serious medical problem.  If vomiting develops, or if you are not able to drink enough fluids, you are at risk of becoming dehydrated.  Follow recommendations for eating and drinking and take over-the-counter and prescription medicines only as told by your health care provider.  Contact a health care provider right away if your symptoms worsen or you have new symptoms.  Keep all follow-up visits as told by your health care provider. This is important. This information is not intended to replace advice given to you by your health care provider. Make sure you discuss any questions you have with your health care provider. Document Revised: 01/27/2018 Document Reviewed: 01/27/2018 Elsevier Patient Education  [place]. Dehydration, Adult Dehydration is a condition in which there is not enough water or other fluids in the body. This happens when a person loses more fluids than he or she takes in. Important organs, such as the kidneys, brain, and heart, cannot function without a proper amount of fluids. Any loss of fluids  from the body can lead to dehydration. Dehydration can be mild, moderate, or severe. It should be treated right away to prevent it from becoming severe. What are the causes? Dehydration may be caused by:  Conditions that cause loss of water or other fluids, such as diarrhea, vomiting, or sweating or urinating a lot.  Not drinking enough fluids, especially when you are ill or doing activities that require a lot of energy.  Other illnesses and conditions, such as fever or infection.  Certain medicines, such as medicines that remove excess fluid from the body (diuretics).  Lack of safe drinking water.  Not being able to get enough water and food. What increases the risk? The following factors may make you more likely to develop this condition:  Having a  long-term (chronic) illness that has not been treated properly, such as diabetes, heart disease, or kidney disease.  Being 44 years of age or older.  Having a disability.  Living in a place that is high in altitude, where thinner, drier air causes more fluid loss.  Doing exercises that put stress on your body for a long time (endurance sports). What are the signs or symptoms? Symptoms of dehydration depend on how severe it is. Mild or moderate dehydration  Thirst.  Dry lips or dry mouth.  Dizziness or light-headedness, especially when standing up from a seated position.  Muscle cramps.  Dark urine. Urine may be the color of tea.  Less urine or tears produced than usual.  Headache. Severe dehydration  Changes in skin. Your skin may be cold and clammy, blotchy, or pale. Your skin also may not return to normal after being lightly pinched and released.  Little or no tears, urine, or sweat.  Changes in vital signs, such as rapid breathing and low blood pressure. Your pulse may be weak or may be faster than 100 beats a minute when you are sitting still.  Other changes, such as: ? Feeling very thirsty. ? Sunken  eyes. ? Cold hands and feet. ? Confusion. ? Being very tired (lethargic) or having trouble waking from sleep. ? Short-term weight loss. ? Loss of consciousness. How is this diagnosed? This condition is diagnosed based on your symptoms and a physical exam. You may have blood and urine tests to help confirm the diagnosis. How is this treated? Treatment for this condition depends on how severe it is. Treatment should be started right away. Do not wait until dehydration becomes severe. Severe dehydration is an emergency and needs to be treated in a hospital.  Mild or moderate dehydration can be treated at home. You may be asked to: ? Drink more fluids. ? Drink an oral rehydration solution (ORS). This drink helps restore proper amounts of fluids and salts and minerals in the blood (electrolytes).  Severe dehydration can be treated: ? With IV fluids. ? By correcting abnormal levels of electrolytes. This is often done by giving electrolytes through a tube that is passed through your nose and into your stomach (nasogastric tube, or NG tube). ? By treating the underlying cause of dehydration. Follow these instructions at home: Oral rehydration solution If told by your health care provider, drink an ORS:  Make an ORS by following instructions on the package.  Start by drinking small amounts, about  cup (120 mL) every 5-10 minutes.  Slowly increase how much you drink until you have taken the amount recommended by your health care provider. Eating and drinking         Drink enough clear fluid to keep your urine pale yellow. If you were told to drink an ORS, finish the ORS first and then start slowly drinking other clear fluids. Drink fluids such as: ? Water. Do not drink only water. Doing that can lead to hyponatremia, which is having too little salt (sodium) in the body. ? Water from ice chips you suck on. ? Fruit juice that you have added water to (diluted fruit juice). ? Low-calorie  sports drinks.  Eat foods that contain a healthy balance of electrolytes, such as bananas, oranges, potatoes, tomatoes, and spinach.  Do not drink alcohol.  Avoid the following: ? Drinks that contain a lot of sugar. These include high-calorie sports drinks, fruit juice that is not diluted, and soda. ? Caffeine. ? Foods that  are greasy or contain a lot of fat or sugar. General instructions  Take over-the-counter and prescription medicines only as told by your health care provider.  Do not take sodium tablets. Doing that can lead to having too much sodium in the body (hypernatremia).  Return to your normal activities as told by your health care provider. Ask your health care provider what activities are safe for you.  Keep all follow-up visits as told by your health care provider. This is important. Contact a health care provider if:  You have muscle cramps, pain, or discomfort, such as: ? Pain in your abdomen and the pain gets worse or stays in one area (localizes). ? Stiff neck.  You have a rash.  You are more irritable than usual.  You are sleepier or have a harder time waking than usual.  You feel weak or dizzy.  You feel very thirsty. Get help right away if you have:  Any symptoms of severe dehydration.  Symptoms of vomiting, such as: ? You cannot eat or drink without vomiting. ? Vomiting gets worse or does not go away. ? Vomit includes blood or green matter (bile).  Symptoms that get worse with treatment.  A fever.  A severe headache.  Problems with urination or bowel movements, such as: ? Diarrhea that gets worse or does not go away. ? Blood in your stool (feces). This may cause stool to look black and tarry. ? Not urinating, or urinating only a small amount of very dark urine, within 6-8 hours.  Trouble breathing. These symptoms may represent a serious problem that is an emergency. Do not wait to see if the symptoms will go away. Get medical help right  away. Call your local emergency services (911 in the [place] Do not drive yourself to the hospital. Summary  Dehydration is a condition in which there is not enough water or other fluids in the body. This happens when a person loses more fluids than he or she takes in.  Treatment for this condition depends on how severe it is. Treatment should be started right away. Do not wait until dehydration becomes severe.  Drink enough clear fluid to keep your urine pale yellow. If you were told to drink an oral rehydration solution (ORS), finish the ORS first and then start slowly drinking other clear fluids.  Take over-the-counter and prescription medicines only as told by your health care provider.  Get help right away if you have any symptoms of severe dehydration. This information is not intended to replace advice given to you by your health care provider. Make sure you discuss any questions you have with your health care provider. Document Revised: 04/01/2019 Document Reviewed: 04/01/2019 Elsevier Patient Education  [place].

## 2020-02-28 NOTE — Progress Notes (Signed)
RN requested nutrition consult.  Patient is a 69 yo male diagnosed with metastatic Bladder cancer receiving Padcev and followed by Dr. Alen Blew.  PHM is not significant.  Medications include Lomotil, Cymbalta, Oxycontin, and Compazine.  Labs include Na 133, Glucose 331, and BUN 39, Albumin 3.2 on June 25.  Height: 6'1". Weight:165 pounds. UBW: 206 pounds February 2021. BMI: 21.77.  Estimated nutrition needs: 2250-2450 cal, 95-110 g protein, greater than 2.3 L fluid.  Patient reports Diarrhea is now resolved with Immodium and has been normal for 1 week. He has early morning nausea and has difficulty getting his compazine down in the am. Reports this is due to thick saliva.  He complains he is "losing his vision". He has vomiting with some liquids. Patient reports that he eats some yogurt, Jell-O, baby food fruits and vegetables, and some protein shakes. Patient has 20% wt loss over 4 months which is significant.  Nutrition diagnosis: Unintended weight loss related to metastatic bladder cancer and associated treatments as evidenced by 20% weight loss over 4 months. Patient meets criteria for severe malnutrition secondary to 20% weight loss over 4 months and less than 75% energy intake for greater than 1 month.  Patient also noted to have depletion of both fat and muscle mass on physical exam.  Intervention: Educated patient on the importance of rinsing mouth out with baking soda and salt water to thin down secretions. Recommended increased hydration. Encourage patient to take nausea medication as prescribed. RN has informed MD about vision. Reviewed soft, moist, high-protein, high-calorie foods and liquids.  Encouraged him to consume small frequent meals and snacks throughout the day. Provided patient with samples of oral nutrition supplements and encouraged he consume up to 3 daily. Provided patient with fact sheets.  Questions were answered.  Teach back method used.  Contact  information given.  Monitoring, evaluation, goals: Patient will tolerate increased calories and protein to minimize further weight loss.  Next visit: Friday, July 9 during infusion.  **Disclaimer: This note was dictated with voice recognition software. Similar sounding words can inadvertently be transcribed and this note may contain transcription errors which may not have been corrected upon publication of note.**

## 2020-02-28 NOTE — Progress Notes (Signed)
Nutrition came to see patient at bedside. Patient complaining of nausea at this time. Left patient accessed and received orders for Zofran 8 mg IV.  Patient deaccessed and flushed.

## 2020-02-28 NOTE — Addendum Note (Signed)
Addended by: Jolaine Click on: 02/28/2020 11:10 AM   Modules accepted: Orders

## 2020-03-02 ENCOUNTER — Telehealth: Payer: Self-pay | Admitting: Oncology

## 2020-03-02 ENCOUNTER — Inpatient Hospital Stay: Payer: Medicare Other

## 2020-03-02 ENCOUNTER — Inpatient Hospital Stay: Payer: Medicare Other | Attending: Oncology | Admitting: Oncology

## 2020-03-02 DIAGNOSIS — K219 Gastro-esophageal reflux disease without esophagitis: Secondary | ICD-10-CM | POA: Insufficient documentation

## 2020-03-02 DIAGNOSIS — M47816 Spondylosis without myelopathy or radiculopathy, lumbar region: Secondary | ICD-10-CM | POA: Insufficient documentation

## 2020-03-02 DIAGNOSIS — R739 Hyperglycemia, unspecified: Secondary | ICD-10-CM | POA: Insufficient documentation

## 2020-03-02 DIAGNOSIS — F329 Major depressive disorder, single episode, unspecified: Secondary | ICD-10-CM | POA: Insufficient documentation

## 2020-03-02 DIAGNOSIS — R269 Unspecified abnormalities of gait and mobility: Secondary | ICD-10-CM | POA: Insufficient documentation

## 2020-03-02 DIAGNOSIS — R918 Other nonspecific abnormal finding of lung field: Secondary | ICD-10-CM | POA: Insufficient documentation

## 2020-03-02 DIAGNOSIS — G8929 Other chronic pain: Secondary | ICD-10-CM | POA: Insufficient documentation

## 2020-03-02 DIAGNOSIS — Z905 Acquired absence of kidney: Secondary | ICD-10-CM | POA: Insufficient documentation

## 2020-03-02 DIAGNOSIS — R05 Cough: Secondary | ICD-10-CM | POA: Insufficient documentation

## 2020-03-02 DIAGNOSIS — C679 Malignant neoplasm of bladder, unspecified: Secondary | ICD-10-CM | POA: Insufficient documentation

## 2020-03-02 DIAGNOSIS — Z9079 Acquired absence of other genital organ(s): Secondary | ICD-10-CM | POA: Insufficient documentation

## 2020-03-02 DIAGNOSIS — M5136 Other intervertebral disc degeneration, lumbar region: Secondary | ICD-10-CM | POA: Insufficient documentation

## 2020-03-02 DIAGNOSIS — Z79899 Other long term (current) drug therapy: Secondary | ICD-10-CM | POA: Insufficient documentation

## 2020-03-02 DIAGNOSIS — D72819 Decreased white blood cell count, unspecified: Secondary | ICD-10-CM | POA: Insufficient documentation

## 2020-03-02 DIAGNOSIS — R197 Diarrhea, unspecified: Secondary | ICD-10-CM | POA: Insufficient documentation

## 2020-03-02 DIAGNOSIS — Z85528 Personal history of other malignant neoplasm of kidney: Secondary | ICD-10-CM | POA: Insufficient documentation

## 2020-03-02 DIAGNOSIS — R112 Nausea with vomiting, unspecified: Secondary | ICD-10-CM | POA: Insufficient documentation

## 2020-03-02 DIAGNOSIS — E860 Dehydration: Secondary | ICD-10-CM | POA: Insufficient documentation

## 2020-03-02 DIAGNOSIS — Z87891 Personal history of nicotine dependence: Secondary | ICD-10-CM | POA: Insufficient documentation

## 2020-03-02 DIAGNOSIS — N390 Urinary tract infection, site not specified: Secondary | ICD-10-CM | POA: Insufficient documentation

## 2020-03-02 DIAGNOSIS — R1310 Dysphagia, unspecified: Secondary | ICD-10-CM | POA: Insufficient documentation

## 2020-03-02 DIAGNOSIS — I700 Atherosclerosis of aorta: Secondary | ICD-10-CM | POA: Insufficient documentation

## 2020-03-02 DIAGNOSIS — R531 Weakness: Secondary | ICD-10-CM | POA: Insufficient documentation

## 2020-03-02 DIAGNOSIS — R443 Hallucinations, unspecified: Secondary | ICD-10-CM | POA: Insufficient documentation

## 2020-03-02 DIAGNOSIS — E876 Hypokalemia: Secondary | ICD-10-CM | POA: Insufficient documentation

## 2020-03-02 DIAGNOSIS — R590 Localized enlarged lymph nodes: Secondary | ICD-10-CM | POA: Insufficient documentation

## 2020-03-02 DIAGNOSIS — I951 Orthostatic hypotension: Secondary | ICD-10-CM | POA: Insufficient documentation

## 2020-03-02 DIAGNOSIS — R188 Other ascites: Secondary | ICD-10-CM | POA: Insufficient documentation

## 2020-03-02 DIAGNOSIS — Z5112 Encounter for antineoplastic immunotherapy: Secondary | ICD-10-CM | POA: Insufficient documentation

## 2020-03-02 NOTE — Telephone Encounter (Signed)
Scheduled appt per 7/1 sch message - left message for patient with appt date and time/.

## 2020-03-03 ENCOUNTER — Encounter (HOSPITAL_COMMUNITY): Payer: Self-pay

## 2020-03-03 ENCOUNTER — Inpatient Hospital Stay (HOSPITAL_COMMUNITY)
Admission: EM | Admit: 2020-03-03 | Discharge: 2020-03-05 | DRG: 641 | Disposition: A | Discharge status: Home / Self Care | Payer: Medicare Other | Source: Ambulatory Visit | Attending: Family Medicine | Admitting: Family Medicine

## 2020-03-03 ENCOUNTER — Inpatient Hospital Stay (HOSPITAL_BASED_OUTPATIENT_CLINIC_OR_DEPARTMENT_OTHER): Payer: Medicare Other | Admitting: Medical

## 2020-03-03 ENCOUNTER — Other Ambulatory Visit: Payer: Self-pay

## 2020-03-03 DIAGNOSIS — R1310 Dysphagia, unspecified: Secondary | ICD-10-CM | POA: Diagnosis not present

## 2020-03-03 DIAGNOSIS — Z79891 Long term (current) use of opiate analgesic: Secondary | ICD-10-CM | POA: Diagnosis not present

## 2020-03-03 DIAGNOSIS — B370 Candidal stomatitis: Secondary | ICD-10-CM | POA: Diagnosis not present

## 2020-03-03 DIAGNOSIS — R112 Nausea with vomiting, unspecified: Secondary | ICD-10-CM

## 2020-03-03 DIAGNOSIS — Z20822 Contact with and (suspected) exposure to covid-19: Secondary | ICD-10-CM | POA: Diagnosis present

## 2020-03-03 DIAGNOSIS — Z905 Acquired absence of kidney: Secondary | ICD-10-CM

## 2020-03-03 DIAGNOSIS — E876 Hypokalemia: Secondary | ICD-10-CM | POA: Diagnosis not present

## 2020-03-03 DIAGNOSIS — E119 Type 2 diabetes mellitus without complications: Secondary | ICD-10-CM

## 2020-03-03 DIAGNOSIS — C679 Malignant neoplasm of bladder, unspecified: Secondary | ICD-10-CM

## 2020-03-03 DIAGNOSIS — Z79899 Other long term (current) drug therapy: Secondary | ICD-10-CM

## 2020-03-03 DIAGNOSIS — N390 Urinary tract infection, site not specified: Secondary | ICD-10-CM | POA: Diagnosis present

## 2020-03-03 DIAGNOSIS — Z794 Long term (current) use of insulin: Secondary | ICD-10-CM | POA: Diagnosis not present

## 2020-03-03 DIAGNOSIS — E860 Dehydration: Principal | ICD-10-CM | POA: Diagnosis present

## 2020-03-03 DIAGNOSIS — D701 Agranulocytosis secondary to cancer chemotherapy: Secondary | ICD-10-CM | POA: Diagnosis present

## 2020-03-03 DIAGNOSIS — Z681 Body mass index (BMI) 19 or less, adult: Secondary | ICD-10-CM

## 2020-03-03 DIAGNOSIS — K219 Gastro-esophageal reflux disease without esophagitis: Secondary | ICD-10-CM | POA: Diagnosis not present

## 2020-03-03 DIAGNOSIS — C670 Malignant neoplasm of trigone of bladder: Secondary | ICD-10-CM | POA: Diagnosis not present

## 2020-03-03 DIAGNOSIS — K521 Toxic gastroenteritis and colitis: Secondary | ICD-10-CM | POA: Diagnosis present

## 2020-03-03 DIAGNOSIS — E1165 Type 2 diabetes mellitus with hyperglycemia: Secondary | ICD-10-CM | POA: Diagnosis present

## 2020-03-03 DIAGNOSIS — R627 Adult failure to thrive: Secondary | ICD-10-CM | POA: Diagnosis present

## 2020-03-03 DIAGNOSIS — R634 Abnormal weight loss: Secondary | ICD-10-CM | POA: Diagnosis present

## 2020-03-03 DIAGNOSIS — Z85528 Personal history of other malignant neoplasm of kidney: Secondary | ICD-10-CM

## 2020-03-03 DIAGNOSIS — R443 Hallucinations, unspecified: Secondary | ICD-10-CM | POA: Diagnosis not present

## 2020-03-03 DIAGNOSIS — Z87891 Personal history of nicotine dependence: Secondary | ICD-10-CM

## 2020-03-03 DIAGNOSIS — Z906 Acquired absence of other parts of urinary tract: Secondary | ICD-10-CM

## 2020-03-03 DIAGNOSIS — G894 Chronic pain syndrome: Secondary | ICD-10-CM | POA: Diagnosis present

## 2020-03-03 DIAGNOSIS — Z95828 Presence of other vascular implants and grafts: Secondary | ICD-10-CM

## 2020-03-03 DIAGNOSIS — B3781 Candidal esophagitis: Secondary | ICD-10-CM | POA: Diagnosis not present

## 2020-03-03 LAB — COMPREHENSIVE METABOLIC PANEL
ALT: 69 U/L — ABNORMAL HIGH (ref 0–44)
AST: 27 U/L (ref 15–41)
Albumin: 3.1 g/dL — ABNORMAL LOW (ref 3.5–5.0)
Alkaline Phosphatase: 48 U/L (ref 38–126)
Anion gap: 11 (ref 5–15)
BUN: 27 mg/dL — ABNORMAL HIGH (ref 8–23)
CO2: 22 mmol/L (ref 22–32)
Calcium: 8.3 mg/dL — ABNORMAL LOW (ref 8.9–10.3)
Chloride: 100 mmol/L (ref 98–111)
Creatinine, Ser: 1.07 mg/dL (ref 0.61–1.24)
GFR calc Af Amer: >60 mL/min (ref >60)
GFR calc non Af Amer: >60 mL/min (ref >60)
Glucose, Bld: 255 mg/dL — ABNORMAL HIGH (ref 70–99)
Potassium: 3.1 mmol/L — ABNORMAL LOW (ref 3.5–5.1)
Sodium: 133 mmol/L — ABNORMAL LOW (ref 135–145)
Total Bilirubin: 1.7 mg/dL — ABNORMAL HIGH (ref 0.3–1.2)
Total Protein: 6.2 g/dL — ABNORMAL LOW (ref 6.5–8.1)

## 2020-03-03 LAB — CBC WITH DIFFERENTIAL/PLATELET
Abs Immature Granulocytes: 0 10*3/uL (ref 0.00–0.07)
Band Neutrophils: 11 %
Basophils Absolute: 0 10*3/uL (ref 0.0–0.1)
Basophils Relative: 1 %
Blasts: 0 %
Eosinophils Absolute: 0 10*3/uL (ref 0.0–0.5)
Eosinophils Relative: 0 %
HCT: 36.2 % — ABNORMAL LOW (ref 39.0–52.0)
Hemoglobin: 12.7 g/dL — ABNORMAL LOW (ref 13.0–17.0)
Lymphocytes Relative: 24 %
Lymphs Abs: 0.7 10*3/uL (ref 0.7–4.0)
MCH: 28.4 pg (ref 26.0–34.0)
MCHC: 35.1 g/dL (ref 30.0–36.0)
MCV: 81 fL (ref 80.0–100.0)
Metamyelocytes Relative: 0 %
Monocytes Absolute: 0.5 10*3/uL (ref 0.1–1.0)
Monocytes Relative: 15 %
Myelocytes: 0 %
Neutro Abs: 1.8 10*3/uL (ref 1.7–7.7)
Neutrophils Relative %: 49 %
Other: 0 %
Platelets: 328 10*3/uL (ref 150–400)
Promyelocytes Relative: 0 %
RBC: 4.47 MIL/uL (ref 4.22–5.81)
RDW: 12.2 % (ref 11.5–15.5)
WBC: 3 10*3/uL — ABNORMAL LOW (ref 4.0–10.5)
nRBC: 0 % (ref 0.0–0.2)
nRBC: 0 /100 WBC

## 2020-03-03 LAB — HEMOGLOBIN A1C
Hgb A1c MFr Bld: 9.1 % — ABNORMAL HIGH (ref 4.8–5.6)
Mean Plasma Glucose: 214.47 mg/dL

## 2020-03-03 LAB — GLUCOSE, CAPILLARY
Glucose-Capillary: 195 mg/dL — ABNORMAL HIGH (ref 70–99)
Glucose-Capillary: 142 mg/dL — ABNORMAL HIGH (ref 70–99)

## 2020-03-03 LAB — URINALYSIS, ROUTINE W REFLEX MICROSCOPIC
Bilirubin Urine: NEGATIVE
Glucose, UA: 50 mg/dL — AB
Ketones, ur: 5 mg/dL — AB
Nitrite: NEGATIVE
Protein, ur: 100 mg/dL — AB
Specific Gravity, Urine: 1.012 (ref 1.005–1.030)
pH: 8 (ref 5.0–8.0)

## 2020-03-03 LAB — HIV ANTIBODY (ROUTINE TESTING W REFLEX): HIV Screen 4th Generation wRfx: NONREACTIVE

## 2020-03-03 LAB — SARS CORONAVIRUS 2 BY RT PCR (HOSPITAL ORDER, PERFORMED IN ~~LOC~~ HOSPITAL LAB): SARS Coronavirus 2: NEGATIVE

## 2020-03-03 MED ORDER — SENNOSIDES-DOCUSATE SODIUM 8.6-50 MG PO TABS: 1.0000 | ORAL_TABLET | Freq: Every evening | ORAL | Status: DC | PRN

## 2020-03-03 MED ORDER — ENOXAPARIN SODIUM 40 MG/0.4ML ~~LOC~~ SOLN
40.0000 mg | SUBCUTANEOUS | Status: DC
  Administered 2020-03-03 – 2020-03-04 (×2): 40 mg via SUBCUTANEOUS
  Filled 2020-03-03 (×3): qty 0.4

## 2020-03-03 MED ORDER — POTASSIUM CHLORIDE 20 MEQ PO PACK
40.0000 meq | PACK | Freq: Once | ORAL | Status: DC
  Filled 2020-03-03: qty 2

## 2020-03-03 MED ORDER — POTASSIUM CHLORIDE 20 MEQ PO PACK
40.0000 meq | PACK | Freq: Once | ORAL | Status: AC
  Administered 2020-03-03: 40 meq via ORAL
  Filled 2020-03-03: qty 2

## 2020-03-03 MED ORDER — SODIUM CHLORIDE 0.9 % IV SOLN: INTRAVENOUS | Status: DC

## 2020-03-03 MED ORDER — SODIUM CHLORIDE 0.9 % IV SOLN
1.0000 g | Freq: Every day | INTRAVENOUS | Status: DC
  Administered 2020-03-03 – 2020-03-05 (×3): 1 g via INTRAVENOUS
  Filled 2020-03-03: qty 1
  Filled 2020-03-03: qty 10
  Filled 2020-03-03: qty 1

## 2020-03-03 MED ORDER — SODIUM CHLORIDE 0.9 % IV SOLN: Freq: Once | INTRAVENOUS | Status: AC

## 2020-03-03 MED ORDER — DULOXETINE HCL 20 MG PO CPEP
20.0000 mg | ORAL_CAPSULE | Freq: Two times a day (BID) | ORAL | Status: DC
  Filled 2020-03-03 (×5): qty 1

## 2020-03-03 MED ORDER — INSULIN ASPART 100 UNIT/ML ~~LOC~~ SOLN
0.0000 [IU] | Freq: Three times a day (TID) | SUBCUTANEOUS | Status: DC
  Administered 2020-03-03: 2 [IU] via SUBCUTANEOUS
  Administered 2020-03-04: 1 [IU] via SUBCUTANEOUS
  Administered 2020-03-04: 3 [IU] via SUBCUTANEOUS
  Administered 2020-03-04 – 2020-03-05 (×3): 2 [IU] via SUBCUTANEOUS

## 2020-03-03 MED ORDER — ACETAMINOPHEN 650 MG RE SUPP: 650.0000 mg | Freq: Four times a day (QID) | RECTAL | Status: DC | PRN

## 2020-03-03 MED ORDER — POTASSIUM CHLORIDE 10 MEQ/100ML IV SOLN
10.0000 meq | INTRAVENOUS | Status: AC
  Administered 2020-03-03 (×4): 10 meq via INTRAVENOUS
  Filled 2020-03-03 (×4): qty 100

## 2020-03-03 MED ORDER — SODIUM CHLORIDE 0.9 % IV BOLUS
1000.0000 mL | Freq: Once | INTRAVENOUS | Status: AC
  Administered 2020-03-03: 1000 mL via INTRAVENOUS

## 2020-03-03 MED ORDER — PROCHLORPERAZINE MALEATE 10 MG PO TABS
20.0000 mg | ORAL_TABLET | Freq: Every morning | ORAL | Status: DC
  Administered 2020-03-04 – 2020-03-05 (×2): 20 mg via ORAL
  Filled 2020-03-03 (×3): qty 2

## 2020-03-03 MED ORDER — DIPHENOXYLATE-ATROPINE 2.5-0.025 MG PO TABS
1.0000 | ORAL_TABLET | Freq: Four times a day (QID) | ORAL | Status: DC | PRN
  Administered 2020-03-04: 2 via ORAL
  Administered 2020-03-04: 1 via ORAL
  Filled 2020-03-03: qty 2
  Filled 2020-03-03: qty 1

## 2020-03-03 MED ORDER — PROMETHAZINE HCL 25 MG PO TABS: 12.5000 mg | ORAL_TABLET | Freq: Four times a day (QID) | ORAL | Status: DC | PRN

## 2020-03-03 MED ORDER — ACETAMINOPHEN 325 MG PO TABS
650.0000 mg | ORAL_TABLET | Freq: Four times a day (QID) | ORAL | Status: DC | PRN
  Administered 2020-03-05: 650 mg via ORAL
  Filled 2020-03-03: qty 2

## 2020-03-03 MED ORDER — DOCUSATE SODIUM 100 MG PO CAPS
100.0000 mg | ORAL_CAPSULE | Freq: Two times a day (BID) | ORAL | Status: DC
  Filled 2020-03-03 (×2): qty 1

## 2020-03-03 MED ORDER — LIDOCAINE VISCOUS HCL 2 % MT SOLN
15.0000 mL | Freq: Once | OROMUCOSAL | Status: AC
  Administered 2020-03-03: 15 mL via OROMUCOSAL
  Filled 2020-03-03: qty 15

## 2020-03-03 MED ORDER — FENTANYL 25 MCG/HR TD PT72
1.0000 | MEDICATED_PATCH | TRANSDERMAL | Status: DC
  Administered 2020-03-03: 1 via TRANSDERMAL
  Filled 2020-03-03: qty 1

## 2020-03-03 NOTE — ED Provider Notes (Signed)
[facility] DEPT Provider Note   CSN: [id number] Arrival date & time: 03/03/20  1221     History Chief Complaint  Patient presents with  . Weakness  . Nausea    Riki Gehring Tourigny is a 69 y.o. male.  HPI Patient with history of relapsed, metastatic bladder cancer is s/p cystectomy with ileal conduit from 2016 has recently had a relapse and restarted chemo therapy about 3 weeks ago.He has not been doing well with treatments, persistent nausea, difficulty swallowing, general weakness and weight loss. He has had additional IVF at [facility] in the last week but continues to deteriorate at home with weight loss.     Past Medical History:  Diagnosis Date  . At risk for sleep apnea    STOP-BANG= 4       SENT TO PCP 05-16-2014  . bladder ca dx'd 05/17/14   met dz 12/2017  . Bladder disorder   . Hematuria   . History of renal cell carcinoma    2003  S/P  PARTIAL RIGHT NEPHRECTOMY  . Presence of surgical incision    lumbar diskectomy 05-11-2014-  bandage present  . Urgency of urination     Patient Active Problem List   Diagnosis Date Noted  . Dysphagia 03/03/2020  . Diabetes ([place]) 10/13/2019  . Port-A-Cath in place 02/24/2018  . Goals of care, counseling/discussion 01/28/2018  . Bladder cancer ([place]) 06/01/2014  . Hematuria 05/17/2014  . Lesion of bladder 05/17/2014    Past Surgical History:  Procedure Laterality Date  . CYSTOSCOPY N/A 09/09/2014   Procedure: CYSTOSCOPY WITH INDOCYANINE GREEN DYE INJECTION AND URETHRAL DILATION;  Surgeon: Alexis Frock, MD;  Location: [facility];  Service: Urology;  Laterality: N/A;  . CYSTOSCOPY WITH BIOPSY N/A 05/17/2014   Procedure: CYSTO WITH BLADDER BIOPSY;  Surgeon: Malka So, MD;  Location: [facility];  Service: Urology;  Laterality: N/A;  . EVALUATION UNDER ANESTHESIA WITH HEMORRHOIDECTOMY  07/07/2012   Procedure: EXAM UNDER ANESTHESIA WITH HEMORRHOIDECTOMY;  Surgeon: Joyice Faster. Cornett,  MD;  Location: [facility];  Service: General;  Laterality: N/A;  . IR IMAGING GUIDED PORT INSERTION  02/20/2018  . LUMBAR MICRODISCECTOMY  05-11-2014   L4 -- L5 (partial)  . PARTIAL NEPHRECTOMY Right 2003  . ROBOT ASSISTED LAPAROSCOPIC COMPLETE CYSTECT ILEAL CONDUIT N/A 09/09/2014   Procedure: ROBOTIC ASSISTED LAPAROSCOPIC COMPLETE CYSTOPROSTATECTOMY,  ILEAL CONDUIT;  Surgeon: Alexis Frock, MD;  Location: [facility];  Service: Urology;  Laterality: N/A;       Family History  Problem Relation Age of Onset  . Diabetes Neg Hx     Social History   Tobacco Use  . Smoking status: Former Smoker    Packs/day: 0.50    Years: 25.00    Pack years: 12.50    Types: Cigarettes    Quit date: 06/02/2004    Years since quitting: 15.7  . Smokeless tobacco: Never Used  Substance Use Topics  . Alcohol use: Yes    Alcohol/week: 2.0 standard drinks    Types: 2 Standard drinks or equivalent per week  . Drug use: No    Home Medications Prior to Admission medications   Medication Sig Start Date End Date Taking? Authorizing Provider  diphenoxylate-atropine (LOMOTIL) 2.5-0.025 MG tablet 1 to 2 PO QID prn diarrhea Patient taking differently: Take 1-2 tablets by mouth 4 (four) times daily as needed for diarrhea or loose stools.  02/25/20  Yes Tanner, Lyndon Code., PA-C  fentaNYL (DURAGESIC) 25 MCG/HR Place 1  patch onto the skin every 3 (three) days. 02/16/20  Yes Tanner, Lyndon Code., PA-C  prochlorperazine (COMPAZINE) 10 MG tablet TAKE 1 TABLET(10 MG) BY MOUTH EVERY 6 HOURS AS NEEDED FOR NAUSEA OR VOMITING Patient taking differently: Take 20 mg by mouth in the morning.  02/25/20  Yes Shadad, Mathis Dad, MD  DULoxetine (CYMBALTA) 20 MG capsule Take 1 capsule (20 mg total) by mouth 2 (two) times daily. Patient not taking: Reported on 03/03/2020 02/16/20 03/17/20  Sandi Mealy E., PA-C  Insulin Glargine [facility] KWIKPEN) 100 UNIT/ML SOPN Inject 16 Units into the skin every morning. **Replaces Lantus.** Patient not taking: Reported on  01/28/2020 10/18/19   Renato Shin, MD  insulin lispro (HUMALOG KWIKPEN) 100 UNIT/ML KwikPen Inject 0.02 mLs (2 Units total) into the skin 2 (two) times daily with a meal. Patient not taking: Reported on 01/28/2020 10/15/19   Renato Shin, MD  morphine (MS CONTIN) 30 MG 12 hr tablet Take 1 tablet (30 mg total) by mouth every 12 (twelve) hours. Patient not taking: Reported on 03/03/2020 01/28/20   Wyatt Portela, MD  oxyCODONE (OXY IR/ROXICODONE) 5 MG immediate release tablet 1-2 q 4 h prn pain Patient not taking: Reported on 03/03/2020 02/16/20   Harle Stanford., PA-C    Allergies    Patient has no known allergies.  Review of Systems   Review of Systems A comprehensive review of systems was completed and negative except as noted in HPI.   Physical Exam Updated Vital Signs BP 120/75   Pulse (!) 101   Temp 98 F (36.7 C) (Oral)   Resp 17   Ht 6' 2" (1.88 m)   Wt 70.3 kg   SpO2 95%   BMI 19.90 kg/m   Physical Exam Vitals and nursing note reviewed.  Constitutional:      Appearance: Normal appearance.  HENT:     Head: Normocephalic and atraumatic.     Nose: Nose normal.     Mouth/Throat:     Mouth: Mucous membranes are dry.     Comments: Thrush on soft palate and posterior pharynx Eyes:     Extraocular Movements: Extraocular movements intact.     Conjunctiva/sclera: Conjunctivae normal.  Cardiovascular:     Rate and Rhythm: Normal rate.  Pulmonary:     Effort: Pulmonary effort is normal.     Breath sounds: Normal breath sounds.     Comments: Port in R upper chest Abdominal:     General: Abdomen is flat.     Palpations: Abdomen is soft.     Tenderness: There is no abdominal tenderness.  Musculoskeletal:        General: No swelling. Normal range of motion.     Cervical back: Neck supple.  Skin:    General: Skin is warm and dry.  Neurological:     General: No focal deficit present.     Mental Status: He is alert.  Psychiatric:        Mood and Affect: Mood normal.      ED Results / Procedures / Treatments   Labs (all labs ordered are listed, but only abnormal results are displayed) Labs Reviewed  COMPREHENSIVE METABOLIC PANEL - Abnormal; Notable for the following components:      Result Value   Sodium 133 (*)    Potassium 3.1 (*)    Glucose, Bld 255 (*)    BUN 27 (*)    Calcium 8.3 (*)    Total Protein 6.2 (*)    Albumin 3.1 (*)  ALT 69 (*)    Total Bilirubin 1.7 (*)    All other components within normal limits  CBC WITH DIFFERENTIAL/PLATELET - Abnormal; Notable for the following components:   WBC 3.0 (*)    Hemoglobin 12.7 (*)    HCT 36.2 (*)    All other components within normal limits  URINALYSIS, ROUTINE W REFLEX MICROSCOPIC - Abnormal; Notable for the following components:   APPearance CLOUDY (*)    Glucose, UA 50 (*)    Hgb urine dipstick SMALL (*)    Ketones, ur 5 (*)    Protein, ur 100 (*)    Leukocytes,Ua SMALL (*)    Bacteria, UA MANY (*)    All other components within normal limits    EKG None  Radiology No results found.  Procedures Procedures (including critical care time)  Medications Ordered in ED Medications  potassium chloride 10 mEq in 100 mL IVPB (10 mEq Intravenous New Bag/Given 03/03/20 1451)  sodium chloride 0.9 % bolus 1,000 mL (0 mLs Intravenous Stopped 03/03/20 1451)  lidocaine (XYLOCAINE) 2 % viscous mouth solution 15 mL (15 mLs Mouth/Throat Given 03/03/20 1322)  0.9 %  sodium chloride infusion ( Intravenous New Bag/Given 03/03/20 1451)    ED Course  I have reviewed the triage vital signs and the nursing notes.  Pertinent labs & imaging results that were available during my care of the patient were reviewed by me and considered in my medical decision making (see chart for details).  Clinical Course as of Mar 03 1456  Fri Mar 03, 2020  1420 CBC shows mild leukopenia, not significantly changed. CMP shows hyperglycemia and mild hypokalemia. No AKI.    [CS]  2409 Patient reports some improvement in  dysphagia with lidocaine, but tried to drink some water and vomited. Will discuss admission with hospitalists.    [CS]  1451 Spoke with Dr. Madaline Brilliant, Hospitalist who will evaluate for admission.    [CS]    Clinical Course User Index [CS] Truddie Hidden, MD   MDM Rules/Calculators/A&P                          Patient with worsening functional status and hydration at home while getting chemo. He has been getting decadron with his weekly infusions and wife is concerned about developing diabetes. Will check labs, give IVF, viscous lidocaine for thrush discomfort and reassess.  Final Clinical Impression(s) / ED Diagnoses Final diagnoses:  Failure to thrive in adult  Hypokalemia  Dysphagia, unspecified type  Thrush of mouth and esophagus [facility]    Rx / DC Orders ED Discharge Orders    None       Truddie Hidden, MD 03/03/20 1457

## 2020-03-03 NOTE — Progress Notes (Signed)
[facility] Progress Note   Danny Wood [id number] 27-Jun-1951 69 y.o.  Danny Wood is managed by Dr. Alen Blew  Actively treated with chemotherapy/immunotherapy/hormonal therapy: yes  Current therapy: Padcev  Last treated: 02/25/20 (cycle 1, day 15)  Next scheduled appointment with provider: 03/10/2020  Assessment: Plan:    Malignant neoplasm of urinary bladder, unspecified site (HCC)  Hallucinations  Intractable vomiting with nausea, unspecified vomiting type  Gastroesophageal reflux disease, unspecified whether esophagitis present   Metastatic malignant neoplasm of the urinary bladder: The patient continues to be managed by Dr. Alen Blew and is status post cycle 1, day 15 of Padcev which was dosed on 02/25/2020.  He is scheduled to be seen in follow-up on 03/10/2020.  Hallucinations, intractable nausea and vomiting, GERD, changes in gait: The patient was taken to the emergency room for evaluation and management.  Please see After Visit Summary for patient specific instructions.  Future Appointments  Date Time Provider [place]  03/10/2020  8:45 AM [facility]FLUSH [facility] None  03/10/2020  9:00 AM [facility] [place] FLUSH [facility] None  03/10/2020  9:30 AM Heilingoetter, Cassandra L, PA-C [facility] None  03/10/2020 10:30 AM [facility] INFUSION [facility] None  03/10/2020 10:30 AM Neff, Barbara L, RD [facility] None  03/17/2020 11:00 AM [facility]FLUSH [facility] None  03/17/2020 11:15 AM [facility] FLUSH [facility] None  03/17/2020 12:30 PM [facility] INFUSION [facility] None  03/24/2020 11:15 AM [facility]/FLUSH [facility] None  03/24/2020 11:30 AM [facility] [place] FLUSH [facility] None  03/24/2020 12:30 PM [facility] INFUSION [facility] None  03/28/2020  2:45 PM [facility] LAB 2 [facility] None  03/28/2020  3:00 PM [facility] INFUSION [facility] None  03/28/2020  3:30 PM Shadad, Mathis Dad, MD [facility] None    No orders of  the defined types were placed in this encounter.      Subjective:   Patient ID:  Danny Wood is a 69 y.o. (DOB 16-Sep-1950) male.  Chief Complaint:  No chief complaint on file.   HPI Danny Wood is a 69 year old male with a diagnosis of a metastatic malignant neoplasm of the urinary bladder: He continues to be managed by Dr. Alen Blew and is status post cycle 1, day 15 of Padcev which was dosed on 02/25/2020.  He was scheduled to be seen yesterday but he missed his appointment.  He presents as a walk-in patient today with his wife.  He reports that he is not eating, is having hallucinations, is nauseated, has GERD, and has changes in his gait.  He reports that he "needs nutrition desperately."  He request that we put a tube down his throat and feed him either that way or feed him through his Port-A-Cath.  He would like this or would like to have a PEG tube.  He request a bag of nutrients today.  He was told that the fluids that we have been giving him have been a salt water solution.  His wife reports that he can taste the salt after he is given an IV of normal saline.  He reports that he has been coughing up small whitish granules from the back of his throat that he believes are pieces of salt.  Medications: I have reviewed the patient's current medications.  Allergies: No Known Allergies  Past Medical History:  Diagnosis Date  . At risk for sleep apnea    STOP-BANG= 4       SENT TO PCP 05-16-2014  . bladder ca dx'd 05/17/14   met dz 12/2017  .  Bladder disorder   . Hematuria   . History of renal cell carcinoma    2003  S/P  PARTIAL RIGHT NEPHRECTOMY  . Presence of surgical incision    lumbar diskectomy 05-11-2014-  bandage present  . Urgency of urination     Past Surgical History:  Procedure Laterality Date  . CYSTOSCOPY N/A 09/09/2014   Procedure: CYSTOSCOPY WITH INDOCYANINE GREEN DYE INJECTION AND URETHRAL DILATION;  Surgeon: Alexis Frock, MD;  Location: [facility];   Service: Urology;  Laterality: N/A;  . CYSTOSCOPY WITH BIOPSY N/A 05/17/2014   Procedure: CYSTO WITH BLADDER BIOPSY;  Surgeon: Malka So, MD;  Location: [facility];  Service: Urology;  Laterality: N/A;  . EVALUATION UNDER ANESTHESIA WITH HEMORRHOIDECTOMY  07/07/2012   Procedure: EXAM UNDER ANESTHESIA WITH HEMORRHOIDECTOMY;  Surgeon: Joyice Faster. Cornett, MD;  Location: [facility];  Service: General;  Laterality: N/A;  . IR IMAGING GUIDED PORT INSERTION  02/20/2018  . LUMBAR MICRODISCECTOMY  05-11-2014   L4 -- L5 (partial)  . PARTIAL NEPHRECTOMY Right 2003  . ROBOT ASSISTED LAPAROSCOPIC COMPLETE CYSTECT ILEAL CONDUIT N/A 09/09/2014   Procedure: ROBOTIC ASSISTED LAPAROSCOPIC COMPLETE CYSTOPROSTATECTOMY,  ILEAL CONDUIT;  Surgeon: Alexis Frock, MD;  Location: [facility];  Service: Urology;  Laterality: N/A;    Family History  Problem Relation Age of Onset  . Diabetes Neg Hx     Social History   Socioeconomic History  . Marital status: Married    Spouse name: Not on file  . Number of children: Not on file  . Years of education: Not on file  . Highest education level: Not on file  Occupational History  . Not on file  Tobacco Use  . Smoking status: Former Smoker    Packs/day: 0.50    Years: 25.00    Pack years: 12.50    Types: Cigarettes    Quit date: 06/02/2004    Years since quitting: 15.7  . Smokeless tobacco: Never Used  Substance and Sexual Activity  . Alcohol use: Yes    Alcohol/week: 2.0 standard drinks    Types: 2 Standard drinks or equivalent per week  . Drug use: No  . Sexual activity: Not on file  Other Topics Concern  . Not on file  Social History Narrative  . Not on file   Social Determinants of Health   Financial Resource Strain:   . Difficulty of Paying Living Expenses:   Food Insecurity:   . Worried About Charity fundraiser in the Last Year:   . Arboriculturist in the Last Year:   Transportation Needs:   . Film/video editor (Medical):   Marland Kitchen  Lack of Transportation (Non-Medical):   Physical Activity:   . Days of Exercise per Week:   . Minutes of Exercise per Session:   Stress:   . Feeling of Stress :   Social Connections:   . Frequency of Communication with Friends and Family:   . Frequency of Social Gatherings with Friends and Family:   . Attends Religious Services:   . Active Member of Clubs or Organizations:   . Attends Archivist Meetings:   Marland Kitchen Marital Status:   Intimate Partner Violence:   . Fear of Current or Ex-Partner:   . Emotionally Abused:   Marland Kitchen Physically Abused:   . Sexually Abused:     Past Medical History, Surgical history, Social history, and Family history were reviewed and updated as appropriate.   Please see review of systems for  further details on the patient's review from today.   Review of Systems:  Review of Systems  Constitutional: Positive for appetite change. Negative for activity change, chills, diaphoresis and fever.  HENT: Positive for trouble swallowing.   Respiratory: Positive for shortness of breath. Negative for cough and chest tightness.   Cardiovascular: Negative for chest pain, palpitations and leg swelling.  Gastrointestinal: Positive for nausea. Negative for abdominal distention, abdominal pain, constipation, diarrhea and vomiting.  Musculoskeletal: Positive for gait problem.    Objective:   Physical Exam:  There were no vitals taken for this visit. ECOG: 1  Physical Exam Constitutional:      General: He is not in acute distress.    Appearance: He is not diaphoretic.  HENT:     Head: Normocephalic and atraumatic.  Skin:    General: Skin is warm and dry.  Neurological:     Mental Status: He is alert.     Gait: Gait abnormal (The patient is ambulating with the use of a wheelchair.).  Psychiatric:        Mood and Affect: Mood is depressed.     Lab Review:     Component Value Date/Time   NA 133 (L) 03/03/2020 1249   NA 139 08/05/2014 1335   K 3.1 (L)  03/03/2020 1249   K 4.3 08/05/2014 1335   CL 100 03/03/2020 1249   CO2 22 03/03/2020 1249   CO2 26 08/05/2014 1335   GLUCOSE 255 (H) 03/03/2020 1249   GLUCOSE 97 08/05/2014 1335   BUN 27 (H) 03/03/2020 1249   BUN 18.2 08/05/2014 1335   CREATININE 1.07 03/03/2020 1249   CREATININE 1.18 02/25/2020 0932   CREATININE 1.1 08/05/2014 1335   CALCIUM 8.3 (L) 03/03/2020 1249   CALCIUM 9.5 08/05/2014 1335   PROT 6.2 (L) 03/03/2020 1249   PROT 6.5 08/05/2014 1335   ALBUMIN 3.1 (L) 03/03/2020 1249   ALBUMIN 3.9 08/05/2014 1335   AST 27 03/03/2020 1249   AST 26 02/25/2020 0932   AST 45 (H) 08/05/2014 1335   ALT 69 (H) 03/03/2020 1249   ALT 47 (H) 02/25/2020 0932   ALT 73 (H) 08/05/2014 1335   ALKPHOS 48 03/03/2020 1249   ALKPHOS 98 08/05/2014 1335   BILITOT 1.7 (H) 03/03/2020 1249   BILITOT 2.4 (H) 02/25/2020 0932   BILITOT 0.47 08/05/2014 1335   GFRNONAA >60 03/03/2020 1249   GFRNONAA >60 02/25/2020 0932   GFRAA >60 03/03/2020 1249   GFRAA >60 02/25/2020 0932       Component Value Date/Time   WBC 3.0 (L) 03/03/2020 1249   RBC 4.47 03/03/2020 1249   HGB 12.7 (L) 03/03/2020 1249   HGB 14.8 02/25/2020 0932   HGB 11.8 (L) 08/05/2014 1335   HCT 36.2 (L) 03/03/2020 1249   HCT 33.1 (L) 08/05/2014 1335   PLT 328 03/03/2020 1249   PLT 254 02/25/2020 0932   PLT 305 08/05/2014 1335   MCV 81.0 03/03/2020 1249   MCV 81.9 08/05/2014 1335   MCH 28.4 03/03/2020 1249   MCHC 35.1 03/03/2020 1249   RDW 12.2 03/03/2020 1249   RDW 13.7 08/05/2014 1335   LYMPHSABS 0.7 03/03/2020 1249   LYMPHSABS 1.9 08/05/2014 1335   MONOABS 0.5 03/03/2020 1249   MONOABS 1.0 (H) 08/05/2014 1335   EOSABS 0.0 03/03/2020 1249   EOSABS 0.1 08/05/2014 1335   BASOSABS 0.0 03/03/2020 1249   BASOSABS 0.0 08/05/2014 1335   -------------------------------  Imaging from last 24 hours (if applicable):  Radiology interpretation:  CT Chest W Contrast  Result Date: 02/24/2020 CLINICAL DATA:  Follow-up urothelial  carcinoma. Status post Cysto prostatectomy and nodal dissection. EXAM: CT CHEST WITH CONTRAST TECHNIQUE: Multidetector CT imaging of the chest was performed during intravenous contrast administration. CONTRAST:  56m OMNIPAQUE IOHEXOL 300 MG/ML  SOLN COMPARISON:  CT AP 02/08/20 CT chest 09/17/2019 FINDINGS: Cardiovascular: The heart size appears normal. Aortic atherosclerosis. Lad and left circumflex coronary artery calcifications. Mediastinum/Nodes: Normal appearance of the thyroid gland. The trachea appears patent and is midline. Normal appearance of the esophagus. No enlarged axillary, supraclavicular, mediastinal, or hilar adenopathy. Lungs/Pleura: No pleural effusion. Previously noted cavitating nodule within the right lung apex has decreased in size in the interval. On today's study this measures 1.2 x 0.5 cm, image 26/5. New thin walled cyst within the medial right apex likely reflects a post inflammatory/infectious pneumatocele. Surrounding ground-glass attenuation compatible with residual changes due to inflammation/infection. Tiny nodule within the left apex measures 3 mm and is unchanged, image 20/5. Calcified perifissural nodule is identified in the superior segment of right lower lobe. Tiny nonspecific left upper lobe lung nodule is unchanged measuring 2 mm. Upper Abdomen: No acute findings identified within the upper abdomen. Upper abdominal adenopathy is identified. Again seen is tumor metastasis within the upper abdomen extending from the porta hepatis along the celiac trunk and common hepatic artery origin. Small volume of perihepatic ascites is new from previous exam. Musculoskeletal: No chest wall abnormality. No acute or significant osseous findings. IMPRESSION: 1. Interval decrease in size of right apical cavitary nodule with surrounding inflammatory changes. Favor inflammatory/infectious process 2. No specific findings identified to suggest metastatic disease within the chest. Scattered tiny lung  nodules measuring 3 mm or less are unchanged in the interval. 3. Similar appearance of nodal metastasis within the upper abdomen. Electronically Signed   By: TKerby MoorsM.D.   On: 02/24/2020 15:06   CT Abdomen Pelvis W Contrast  Result Date: 02/08/2020 CLINICAL DATA:  Invasive bladder cancer restaging, prior cysto prostatectomy. Ongoing chemotherapy. Umbilical and epigastric pain for 3 weeks with reduced appetite. Weight loss. EXAM: CT ABDOMEN AND PELVIS WITH CONTRAST TECHNIQUE: Multidetector CT imaging of the abdomen and pelvis was performed using the standard protocol following bolus administration of intravenous contrast. CONTRAST:  1039mOMNIPAQUE IOHEXOL 300 MG/ML  SOLN COMPARISON:  CT scan 09/17/2019 FINDINGS: Lower chest: Descending thoracic aortic atherosclerotic calcification. Hepatobiliary: Unremarkable Pancreas: Unremarkable Spleen: Unremarkable Adrenals/Urinary Tract: Both adrenal glands appear normal. Scarring in the right mid kidney, stable. Left mid kidney cyst posteriorly, benign appearance. Faint abnormal hypoenhancement in the right kidney lower pole on portal venous and delayed phase images, for example image 46/2, infiltrative process such as pyelonephritis cannot be excluded. Cystoprostatectomy with ileal conduit. No hydronephrosis. No hydroureter. No appreciable filling defect along the urothelium. Stomach/Bowel: Unremarkable Vascular/Lymphatic: Progressive adenopathy compatible with malignancy. In particular, there is indistinctly marginated tumor extending from the porta hepatis along the celiac trunk and common hepatic artery region, measuring approximately 4.5 by 2.6 by 2.8 cm on image 26/2. Along the distal in the SMA in the root of the mesentery, a 3.3 by 2.4 by 4.0 cm tumor mass has substantially increased from prior. Progressive and pathologic retroperitoneal and pelvic adenopathy noted. Index left periaortic lymph node just below the renal vessels measures 1.4 cm in short axis  on image 39/2, previously 0.8 cm by my measurements. Index right inguinal node 1.8 cm in short axis on image 84/2, formerly 1.3 cm. Left inguinal lymph nodes are also prominent.  Reproductive: Cystoprostatectomy. Other: Small but increased amount of ascites including along the right paracolic gutter and in the lower anatomic pelvis. Musculoskeletal: Lumbar spondylosis and degenerative disc disease causing generally mild degrees of impingement at L2-3 and L3-4. IMPRESSION: 1. Worsening retroperitoneal and pelvic adenopathy compatible with progressive malignancy. Progressive mesenteric lesions adjacent to the celiac trunk and along the distal SMA compatible with malignancy. 2. Mild but abnormal hypoenhancement in the right kidney lower pole on portal venous and delayed phase images, possibly reflecting pyelonephritis. 3. Small but increased amount of ascites. 4. Other imaging findings of potential clinical significance: Lumbar spondylosis and degenerative disc disease causing generally mild degrees of impingement at L2-3 and L3-4. Aortic atherosclerosis. Aortic Atherosclerosis (ICD10-I70.0). Electronically Signed   By: Demetris Meinhardt Clines M.D.   On: 02/08/2020 13:03        This case was discussed Dr. Alen Blew. He expressed agreement with my management of this patient.

## 2020-03-03 NOTE — ED Triage Notes (Signed)
Pt brought over from cancer center. Pt has received 3 chemo treatments. Pt reports hallucinations, abnormal gait, loss of appetite, nausea. Mental status at baseline per PA. Hx of metastatic bladder cancer.

## 2020-03-03 NOTE — Progress Notes (Signed)
Events noted.  Patient known to me with advanced bladder cancer currently receiving palliative chemotherapy in the form of Padcev.  He has completed the first cycle of therapy.  He presented with symptoms of nausea and vomiting and poor p.o. intake.  Laboratory data reviewed which showed mild hyperglycemia and increased BUN although improved compared to last week.  I agree with the current management with continued supportive care occluding antiemetics and diarrhea medication.  I would recommend GI evaluation for his dysphagia with a possible endoscopy.    No additional oncology input at this time.

## 2020-03-03 NOTE — ED Notes (Signed)
Patient's wife reports she is going home and taking all patient belongings with her at this time.

## 2020-03-03 NOTE — H&P (Addendum)
History and Physical    Danny Wood KGU:[id number] DOB: Apr 28, 1951 DOA: 03/03/2020  PCP: [name], MD   Patient coming from: Home  I have personally briefly reviewed patient's old medical records in [place]  Chief Complaint: Nausea and vomiting associated with difficulty swallowing  HPI: Danny Wood is a 69 y.o. male with medical history significant of relapsed, metastatic bladder cancer  s/p cystectomy with ileal conduit from 2016, partial right nephrectomy who recently had a relapse of bladder cancer and restarted chemo therapy about 3 weeks ago. He has not been doing well with treatments, has developed persistent nausea, difficulty swallowing, generalized weakness and weight loss.  Wife was at bedside reports patient has been throwing up, has developed diarrhea after he started chemotherapy.  He has decreased appetite and has lost some weight. He went to see his oncologist today and was sent for evaluation. He regularly follows up with Dr. Osker Mason   ED Course: He was hypertensive and tachycardic, Vitals:  HR 109, RR 21, BP 152/81, O2 sat 94% on RA, Temp 29F Labs: CMP: Sodium 133 potassium 3.1 chloride 100 bicarb 22 glucose 255 BUN 27 creatinine 1.07 calcium 8.3 anion gap 11, alkaline phosphatase 48 AST 27 ALT 69 total protein 6.2 total bilirubin 1.7 WBC 3.0 hemoglobin 12.7, hematocrit 36.2 MCV 81.0 platelet 328,  UA: Cloudy hemoglobin small glucose 50 ketones 5 leukocyte small bacteria many.  Review of Systems: As per HPI otherwise 10 point review of systems negative.  Review of Systems  Constitutional: Positive for weight loss.  HENT: Negative.   Eyes: Negative.   Respiratory: Negative.   Cardiovascular: Negative.   Gastrointestinal: Positive for diarrhea, nausea and vomiting.  Genitourinary: Negative.   Musculoskeletal: Negative.   Skin: Negative.   Neurological: Positive for weakness.  Endo/Heme/Allergies: Negative.   Psychiatric/Behavioral:  Negative.     Past Medical History:  Diagnosis Date  . At risk for sleep apnea    STOP-BANG= 4       SENT TO PCP 05-16-2014  . bladder ca dx'd 05/17/14   met dz 12/2017  . Bladder disorder   . Hematuria   . History of renal cell carcinoma    2003  S/P  PARTIAL RIGHT NEPHRECTOMY  . Presence of surgical incision    lumbar diskectomy 05-11-2014-  bandage present  . Urgency of urination     Past Surgical History:  Procedure Laterality Date  . CYSTOSCOPY N/A 09/09/2014   Procedure: CYSTOSCOPY WITH INDOCYANINE GREEN DYE INJECTION AND URETHRAL DILATION;  Surgeon: Alexis Frock, MD;  Location: [facility];  Service: Urology;  Laterality: N/A;  . CYSTOSCOPY WITH BIOPSY N/A 05/17/2014   Procedure: CYSTO WITH BLADDER BIOPSY;  Surgeon: Malka So, MD;  Location[facility];  Service: Urology;  Laterality: N/A;  . EVALUATION UNDER ANESTHESIA WITH HEMORRHOIDECTOMY  07/07/2012   Procedure: EXAM UNDER ANESTHESIA WITH HEMORRHOIDECTOMY;  Surgeon: Joyice Faster. Cornett, MD;  Location: [place];  Service: General;  Laterality: N/A;  . IR IMAGING GUIDED PORT INSERTION  02/20/2018  . LUMBAR MICRODISCECTOMY  05-11-2014   L4 -- L5 (partial)  . PARTIAL NEPHRECTOMY Right 2003  . ROBOT ASSISTED LAPAROSCOPIC COMPLETE CYSTECT ILEAL CONDUIT N/A 09/09/2014   Procedure: ROBOTIC ASSISTED LAPAROSCOPIC COMPLETE CYSTOPROSTATECTOMY,  ILEAL CONDUIT;  Surgeon: Alexis Frock, MD;  Location: [facility];  Service: Urology;  Laterality: N/A;     reports that he quit smoking about 15 years ago. His smoking use included cigarettes. He has a 12.50 pack-year smoking  history. He has never used smokeless tobacco. He reports current alcohol use of about 2.0 standard drinks of alcohol per week. He reports that he does not use drugs.  No Known Allergies  Family History  Problem Relation Age of Onset  . Diabetes Neg Hx    : Family history reviewed and not pertinent  Prior to Admission medications   Medication Sig Start Date End  Date Taking? Authorizing Provider  diphenoxylate-atropine (LOMOTIL) 2.5-0.025 MG tablet 1 to 2 PO QID prn diarrhea Patient taking differently: Take 1-2 tablets by mouth 4 (four) times daily as needed for diarrhea or loose stools.  02/25/20  Yes Tanner, Lyndon Code., PA-C  fentaNYL (DURAGESIC) 25 MCG/HR Place 1 patch onto the skin every 3 (three) days. 02/16/20  Yes Tanner, Lyndon Code., PA-C  prochlorperazine (COMPAZINE) 10 MG tablet TAKE 1 TABLET(10 MG) BY MOUTH EVERY 6 HOURS AS NEEDED FOR NAUSEA OR VOMITING Patient taking differently: Take 20 mg by mouth in the morning.  02/25/20  Yes Shadad, Mathis Dad, MD  DULoxetine (CYMBALTA) 20 MG capsule Take 1 capsule (20 mg total) by mouth 2 (two) times daily. Patient not taking: Reported on 03/03/2020 02/16/20 03/17/20  Sandi Mealy E., PA-C  Insulin Glargine [facility] KWIKPEN) 100 UNIT/ML SOPN Inject 16 Units into the skin every morning. **Replaces Lantus.** Patient not taking: Reported on 01/28/2020 10/18/19   Renato Shin, MD  insulin lispro (HUMALOG KWIKPEN) 100 UNIT/ML KwikPen Inject 0.02 mLs (2 Units total) into the skin 2 (two) times daily with a meal. Patient not taking: Reported on 01/28/2020 10/15/19   Renato Shin, MD  morphine (MS CONTIN) 30 MG 12 hr tablet Take 1 tablet (30 mg total) by mouth every 12 (twelve) hours. Patient not taking: Reported on 03/03/2020 01/28/20   Wyatt Portela, MD  oxyCODONE (OXY IR/ROXICODONE) 5 MG immediate release tablet 1-2 q 4 h prn pain Patient not taking: Reported on 03/03/2020 02/16/20   Harle Stanford., PA-C    Physical Exam: Vitals:   03/03/20 1226 03/03/20 1228 03/03/20 1415 03/03/20 1500  BP:  (!) 142/85 120/75 (!) 152/81  Pulse:  (!) 103 (!) 101 (!) 109  Resp:  (!) 21 17 (!) 21  Temp:  98 F (36.7 C)    TempSrc:  Oral    SpO2:  96% 95% 94%  Weight: 74.9 kg 70.3 kg    Height: 6' 1" (1.854 m) 6' 2" (1.88 m)      Constitutional: NAD, calm, comfortable Vitals:   03/03/20 1226 03/03/20 1228 03/03/20 1415 03/03/20 1500  BP:   (!) 142/85 120/75 (!) 152/81  Pulse:  (!) 103 (!) 101 (!) 109  Resp:  (!) 21 17 (!) 21  Temp:  98 F (36.7 C)    TempSrc:  Oral    SpO2:  96% 95% 94%  Weight: 74.9 kg 70.3 kg    Height: 6' 1" (1.854 m) 6' 2" (1.88 m)     Eyes: PERRL, lids and conjunctivae normal ENMT: Mucous membranes are moist. Posterior pharynx whitish exudate noted.Normal dentition.  Neck: normal, supple, no masses, no thyromegaly Respiratory: clear to auscultation bilaterally, no wheezing, no crackles. Normal respiratory effort. No accessory muscle use.  Cardiovascular: Regular rate and rhythm, no murmurs / rubs / gallops. No extremity edema. 2+ pedal pulses. No carotid bruits.  Abdomen: no tenderness, no masses palpated. No hepatosplenomegaly. Bowel sounds positive.  Musculoskeletal: no clubbing / cyanosis. No joint deformity upper and lower extremities. Good ROM, no contractures. Normal muscle tone.  Skin:  no rashes, lesions, ulcers. No induration Neurologic: CN 2-12 grossly intact. Sensation intact, DTR normal. Strength 5/5 in all 4.  Psychiatric: Normal judgment and insight. Alert and oriented x 3. Normal mood.     Labs on Admission: I have personally reviewed following labs and imaging studies  CBC: Recent Labs  Lab 03/03/20 1249  WBC 3.0*  NEUTROABS 1.8  HGB 12.7*  HCT 36.2*  MCV 81.0  PLT 242   Basic Metabolic Panel: Recent Labs  Lab 03/03/20 1249  NA 133*  K 3.1*  CL 100  CO2 22  GLUCOSE 255*  BUN 27*  CREATININE 1.07  CALCIUM 8.3*   GFR: Estimated Creatinine Clearance: 65.7 mL/min (by C-G formula based on SCr of 1.07 mg/dL). Liver Function Tests: Recent Labs  Lab 03/03/20 1249  AST 27  ALT 69*  ALKPHOS 48  BILITOT 1.7*  PROT 6.2*  ALBUMIN 3.1*   No results for input(s): LIPASE, AMYLASE in the last 168 hours. No results for input(s): AMMONIA in the last 168 hours. Coagulation Profile: No results for input(s): INR, PROTIME in the last 168 hours. Cardiac Enzymes: No  results for input(s): CKTOTAL, CKMB, CKMBINDEX, TROPONINI in the last 168 hours. BNP (last 3 results) No results for input(s): PROBNP in the last 8760 hours. HbA1C: No results for input(s): HGBA1C in the last 72 hours. CBG: No results for input(s): GLUCAP in the last 168 hours. Lipid Profile: No results for input(s): CHOL, HDL, LDLCALC, TRIG, CHOLHDL, LDLDIRECT in the last 72 hours. Thyroid Function Tests: No results for input(s): TSH, T4TOTAL, FREET4, T3FREE, THYROIDAB in the last 72 hours. Anemia Panel: No results for input(s): VITAMINB12, FOLATE, FERRITIN, TIBC, IRON, RETICCTPCT in the last 72 hours. Urine analysis:    Component Value Date/Time   COLORURINE YELLOW 03/03/2020 1249   APPEARANCEUR CLOUDY (A) 03/03/2020 1249   LABSPEC 1.012 03/03/2020 1249   PHURINE 8.0 03/03/2020 1249   GLUCOSEU 50 (A) 03/03/2020 1249   HGBUR SMALL (A) 03/03/2020 1249   BILIRUBINUR NEGATIVE 03/03/2020 1249   KETONESUR 5 (A) 03/03/2020 1249   PROTEINUR 100 (A) 03/03/2020 1249   NITRITE NEGATIVE 03/03/2020 1249   LEUKOCYTESUR SMALL (A) 03/03/2020 1249    Radiological Exams on Admission: No results found.  EKG: Independently reviewed.   Assessment/Plan Principal Problem:   Dysphagia Active Problems:   Bladder cancer (HCC)   Port-A-Cath in place   Diabetes (HCC)   Nausea and vomiting   Hypokalemia  Nausea and vomiting associated with dysphagia: Patient recently restarted chemotherapy for 3 weeks.for relapsed bladder cancer. Not able to tolerate well and had developed nausea vomiting and diarrhea. Today feeling generally weak,  tired and has difficulty swallowing. IV Zofran and Phenergan for nausea vomiting Gentle hydration, normal saline at 75 cc for now Start clear liquid diet and advance as tolerated Speech and swallow evaluation.  Hypokalemia: This could be secondary to nausea and vomiting leading to dehydration Potassium replacement in progress, recheck labs tomorrow  morning  UTI: POA: Tachycardia, UA +, white cell count 3.0. Start ceftriaxone 1 g every 24 hours Follow-up urine culture,  Dysphagia: This could be secondary to chemotherapy. Speech and swallow evaluation, Possible GI evaluation if dysphagia persist.  Leukopenia: This could be secondary to chemotherapy. Consider heme-onc consult  Hyperglycemia: Wife denies history of diabetes. Fingersticks 250+, start sliding scale Obtain hemoglobin A1c   Relapsed Bladder cancer: Follow-up heme-onc consult.  Chronic pain syndrome: Continue fentanyl for chronic pain. Continue Lomotil for diarrhea.  DVT prophylaxis: Lovenox Code Status: Full  code Family Communication: Discussed with patient and wife at bedside Disposition Plan: Anticipated discharge home in 1 to 2 days Consults called: Heme-onc consult Admission status: Admitted inpatient   Shawna Clamp MD [facility]   If 7PM-7AM, please contact night-coverage [email or web address]   03/03/2020, 3:29 PM

## 2020-03-04 DIAGNOSIS — E876 Hypokalemia: Secondary | ICD-10-CM

## 2020-03-04 DIAGNOSIS — B370 Candidal stomatitis: Secondary | ICD-10-CM

## 2020-03-04 DIAGNOSIS — C670 Malignant neoplasm of trigone of bladder: Secondary | ICD-10-CM

## 2020-03-04 DIAGNOSIS — B3781 Candidal esophagitis: Secondary | ICD-10-CM

## 2020-03-04 DIAGNOSIS — R627 Adult failure to thrive: Secondary | ICD-10-CM

## 2020-03-04 DIAGNOSIS — R1310 Dysphagia, unspecified: Secondary | ICD-10-CM

## 2020-03-04 LAB — CBC
HCT: 34.4 % — ABNORMAL LOW (ref 39.0–52.0)
Hemoglobin: 12.1 g/dL — ABNORMAL LOW (ref 13.0–17.0)
MCH: 29.3 pg (ref 26.0–34.0)
MCHC: 35.2 g/dL (ref 30.0–36.0)
MCV: 83.3 fL (ref 80.0–100.0)
Platelets: 295 10*3/uL (ref 150–400)
RBC: 4.13 MIL/uL — ABNORMAL LOW (ref 4.22–5.81)
RDW: 12.3 % (ref 11.5–15.5)
WBC: 3.5 10*3/uL — ABNORMAL LOW (ref 4.0–10.5)
nRBC: 0 % (ref 0.0–0.2)

## 2020-03-04 LAB — COMPREHENSIVE METABOLIC PANEL
ALT: 62 U/L — ABNORMAL HIGH (ref 0–44)
AST: 27 U/L (ref 15–41)
Albumin: 2.8 g/dL — ABNORMAL LOW (ref 3.5–5.0)
Alkaline Phosphatase: 43 U/L (ref 38–126)
Anion gap: 14 (ref 5–15)
BUN: 22 mg/dL (ref 8–23)
CO2: 19 mmol/L — ABNORMAL LOW (ref 22–32)
Calcium: 7.9 mg/dL — ABNORMAL LOW (ref 8.9–10.3)
Chloride: 103 mmol/L (ref 98–111)
Creatinine, Ser: 0.99 mg/dL (ref 0.61–1.24)
GFR calc Af Amer: >60 mL/min (ref >60)
GFR calc non Af Amer: >60 mL/min (ref >60)
Glucose, Bld: 163 mg/dL — ABNORMAL HIGH (ref 70–99)
Potassium: 3.5 mmol/L (ref 3.5–5.1)
Sodium: 136 mmol/L (ref 135–145)
Total Bilirubin: 1.4 mg/dL — ABNORMAL HIGH (ref 0.3–1.2)
Total Protein: 5.7 g/dL — ABNORMAL LOW (ref 6.5–8.1)

## 2020-03-04 LAB — GLUCOSE, CAPILLARY
Glucose-Capillary: 160 mg/dL — ABNORMAL HIGH (ref 70–99)
Glucose-Capillary: 224 mg/dL — ABNORMAL HIGH (ref 70–99)
Glucose-Capillary: 138 mg/dL — ABNORMAL HIGH (ref 70–99)

## 2020-03-04 MED ORDER — FLUCONAZOLE IN SODIUM CHLORIDE 200-0.9 MG/100ML-% IV SOLN
200.0000 mg | INTRAVENOUS | Status: DC
  Administered 2020-03-04: 200 mg via INTRAVENOUS
  Filled 2020-03-04 (×2): qty 100

## 2020-03-04 MED ORDER — SODIUM CHLORIDE 0.9% FLUSH: 10.0000 mL | INTRAVENOUS | Status: DC | PRN

## 2020-03-04 MED ORDER — CHLORHEXIDINE GLUCONATE CLOTH 2 % EX PADS
6.0000 | MEDICATED_PAD | Freq: Every day | CUTANEOUS | Status: DC
  Administered 2020-03-04 – 2020-03-05 (×2): 6 via TOPICAL

## 2020-03-04 NOTE — Evaluation (Signed)
Physical Therapy Evaluation Patient Details Name: Danny Wood MRN: [id number] DOB: October 19, 1950 Today's Date: 03/04/2020   History of Present Illness  Danny Wood is a 69 y.o. male with medical history significant of relapsed, metastatic bladder cancer  s/p cystectomy with ileal conduit from 2016, partial right nephrectomy who recently had a relapse of bladder cancer and restarted chemo therapy about 3 weeks ago. He has not been doing well with treatments, has developed persistent nausea, difficulty swallowing, generalized weakness and weight loss.    Clinical Impression  Danny Wood is 69 y.o. male admitted with above HPI and diagnosis. Patient is currently limited by functional impairments below (see PT problem list). Patient lives with wife and other family members and is independent at baseline. He currently is limited most by mid balance impairments during gait and functional mobility and requires min guard/supervision at this time. He has good support/assist from family at home. Patient does report several falls in the last 6 months. Patient will benefit from continued skilled PT interventions to address impairments and progress independence with mobility. Recommending OPPT follow up for balance training and general strengthening. Acute PT will follow and progress as able.     Follow Up Recommendations Outpatient PT    Equipment Recommendations  None recommended by PT    Recommendations for Other Services       Precautions / Restrictions Precautions Precautions: Fall Precaution Comments: pt reports several trips, close calls, and a few falls in last 6 months. pt's wife unaware. Restrictions Weight Bearing Restrictions: No      Mobility  Bed Mobility Overal bed mobility: Modified Independent      General bed mobility comments: No assist required, some extra time needed.  Transfers Overall transfer level: Needs assistance Equipment used: Rolling  walker (2 wheeled) Transfers: Sit to/from [facility] Sit to Stand: Min guard;Supervision Stand pivot transfers: Min guard       General transfer comment: Pt able to complete power up without assist, min guard for safety with rise. Pt using RW initially to rise from EOB. transitioned to no device, able to rise from EOB and commode with supervision/min guard.  Ambulation/Gait Ambulation/Gait assistance: Min guard;Supervision Gait Distance (Feet): 200 Feet Assistive device: Rolling walker (2 wheeled);None Gait Pattern/deviations: Step-through pattern;Decreased stride length Gait velocity: fair   General Gait Details: pt steady with RW and mild balance deviations noted with no device however no overt LOB occured. Min guard for safety to navigate hallway obstacles and doorways. recommend pt use IV pole in hospital and [facility] at home.  Stairs         Wheelchair Mobility    Modified Rankin (Stroke Patients Only)       Balance Overall balance assessment: Mild deficits observed, not formally tested   Sitting balance-Leahy Scale: Normal       Standing balance-Leahy Scale: Good   Single Leg Stance - Right Leg: 8 Single Leg Stance - Left Leg: 3 Tandem Stance - Right Leg: 12 Tandem Stance - Left Leg: 5   Rhomberg - Eyes Closed: 9 (pt did not like having eyes closed)              Pertinent Vitals/Pain Pain Assessment: No/denies pain    Home Living Family/patient expects to be discharged to:: Private residence Living Arrangements: Spouse/significant other Available Help at Discharge: Family Type of Home: House Home Access: Stairs to enter Entrance Stairs-Rails: Chemical engineer of Steps: 2+1 small Home Layout: Multi-level Home Equipment: Environmental consultant - 4  wheels;Cane - single point (electric/adjustable bed) Additional Comments: pt sleeps on downstairs level in finished basement. All bathrooms the toilets are in an alcove and can use walls to  rise. wife and daughter home to help.     Prior Function Level of Independence: Independent            Hand Dominance   Dominant Hand: Right    Extremity/Trunk Assessment   Upper Extremity Assessment Upper Extremity Assessment: Defer to OT evaluation    Lower Extremity Assessment Lower Extremity Assessment: Overall WFL for tasks assessed    Cervical / Trunk Assessment Cervical / Trunk Assessment: Normal  Communication   Communication: No difficulties  Cognition Arousal/Alertness: Awake/alert Behavior During Therapy: WFL for tasks assessed/performed Overall Cognitive Status: Impaired/Different from baseline Area of Impairment: Memory        Memory: Decreased short-term memory         General Comments: pt reporting colors seem more vivid and vision more sharp. he reports blurry vision and double vision intermittently. Pt's wife reports his memory has been altered since initiating chemo and he has been hallucinating.      General Comments      Exercises     Assessment/Plan    PT Assessment Patient needs continued PT services  PT Problem List Decreased strength;Decreased range of motion;Decreased activity tolerance;Decreased balance;Decreased mobility;Decreased cognition;Decreased knowledge of use of DME       PT Treatment Interventions DME instruction;Gait training;Stair training;Functional mobility training;Therapeutic activities;Therapeutic exercise;Balance training;Patient/family education    PT Goals (Current goals can be found in the Care Plan section)  Acute Rehab PT Goals Patient Stated Goal: get back to independece PT Goal Formulation: With patient/family Time For Goal Achievement: 03/18/20 Potential to Achieve Goals: Good    Frequency Min 3X/week   Barriers to discharge        Co-evaluation PT/OT/SLP Co-Evaluation/Treatment: Yes Reason for Co-Treatment: To address functional/ADL transfers PT goals addressed during session: Mobility/safety  with mobility;Balance;Proper use of DME OT goals addressed during session: ADL's and self-care       AM-PAC PT "6 Clicks" Mobility  Outcome Measure Help needed turning from your back to your side while in a flat bed without using bedrails?: None Help needed moving from lying on your back to sitting on the side of a flat bed without using bedrails?: None Help needed moving to and from a bed to a chair (including a wheelchair)?: A Little Help needed standing up from a chair using your arms (e.g., wheelchair or bedside chair)?: A Little Help needed to walk in hospital room?: A Little Help needed climbing 3-5 steps with a railing? : A Little 6 Click Score: 20    End of Session Equipment Utilized During Treatment: Gait belt Activity Tolerance: Patient tolerated treatment well Patient left: in chair;with call bell/phone within reach;with chair alarm set;with family/visitor present Nurse Communication: Mobility status PT Visit Diagnosis: Muscle weakness (generalized) (M62.81);Unsteadiness on feet (R26.81)    Time: 7341-9379 PT Time Calculation (min) (ACUTE ONLY): 38 min   Charges:   PT Evaluation $PT Eval Low Complexity: 1 Low PT Treatments $Gait Training: 8-22 mins       Verner Mould, DPT Acute Rehabilitation Services  Office [phone] Pager [phone]  03/04/2020 3:30 PM

## 2020-03-04 NOTE — Progress Notes (Signed)
PROGRESS NOTE    Danny Wood  FWY:[id number] DOB: 1951-02-10 DOA: 03/03/2020   PCP: [name], MD    Brief Narrative:  Danny Wood is a 69 y.o. male with medical history significant of relapsed, metastatic bladder cancer  s/p cystectomy with ileal conduit from 2016, partial right nephrectomy who recently had a relapse of bladder cancer and restarted chemo therapy about 3 weeks ago. He has not been doing well with treatments, has developed persistent nausea, difficulty swallowing, generalized weakness and weight loss.  Wife was at bedside reports patient has been throwing up, has developed diarrhea after he started chemotherapy.    Oncology consulted suggest continue current management, also found to have a UTI ,being treated with antibiotics  Assessment & Plan:   Principal Problem:   Dysphagia Active Problems:   Bladder cancer ([place])   Port-A-Cath in place   Diabetes ([place])   Nausea and vomiting   Hypokalemia  Nausea and vomiting associated with dysphagia: >>> Improving.  Patient recently restarted chemotherapy for 3 weeks for relapsed bladder cancer. Not able to tolerate well and had developed nausea, vomiting and diarrhea. Today feeling generally weak,  tired and has difficulty swallowing. Continue Zofran and Phenergan for nausea vomiting Gentle hydration, normal saline at 75 cc for now tolerated clear liquid diet and advance as tolerated Speech and swallow evaluation.  Hypokalemia: Resolved.  This could be secondary to nausea and vomiting leading to dehydration Potassium replacement in progress, recheck labs tomorrow morning  UTI: POA: Tachycardia, UA +, white cell count 3.0. Start ceftriaxone 1 g every 24 hours Follow-up urine culture,  Dysphagia: This could be secondary to chemotherapy. Speech and swallow evaluation, Possible GI evaluation if dysphagia persist.  Leukopenia: This could be secondary to chemotherapy. Consider heme-onc  consult  Hyperglycemia: Wife denies history of diabetes. Fingersticks 250+, start sliding scale Obtain hemoglobin A1c   Relapsed Bladder cancer: heme-onc consulted.  No need to resume chemotherapy at this point.  Will reassess  Chronic pain syndrome: Continue fentanyl for chronic pain. Continue Lomotil for diarrhea.  DVT prophylaxis: Lovenox Code Status: Full code Family Communication: Discussed with patient and wife at bedside Disposition Plan: Anticipated discharge home in 1 to 2 days Consults called: Heme-onc consult  Disposition The patient is from: Home Anticipated d/c is to:  Home Anticipated d/c date in 1-2 days Patient currently is not medically stable for discharge.   Consultants:   Oncology   Procedures: None.  Antimicrobials:  Anti-infectives (From admission, onward)   Start     Dose/Rate Route Frequency Ordered Stop   03/04/20 0900  fluconazole (DIFLUCAN) IVPB 200 mg     Discontinue     200 mg 100 mL/hr over 60 Minutes Intravenous Every 24 hours 03/04/20 0832 03/11/20 0859   03/03/20 1600  cefTRIAXone (ROCEPHIN) 1 g in sodium chloride 0.9 % 100 mL IVPB     Discontinue     1 g 200 mL/hr over 30 Minutes Intravenous Daily 03/03/20 1513         Subjective: Patient was seen and examined at bedside.  No overnight events.  He reports feeling much better, has participated in physical therapy.  He reports improved swallowing, has tolerated breakfast in the morning.  Objective: Vitals:   03/04/20 0015 03/04/20 0248 03/04/20 0920 03/04/20 1341  BP: (!) 145/76  (!) 167/89 (!) 162/85  Pulse: (!) 101  (!) 102 (!) 104  Resp: 20  18 17   Temp: 98.5 F (36.9 C)  98 F (36.7 C)  99 F (37.2 C)  TempSrc:   Oral Oral  SpO2: 97%  98% 97%  Weight:  74.2 kg    Height:        Intake/Output Summary (Last 24 hours) at 03/04/2020 1405 Last data filed at 03/04/2020 0900 Gross per 24 hour  Intake 1125 ml  Output 1200 ml   Net -75 ml   Filed Weights   03/03/20 1226 03/03/20 1228 03/04/20 0248  Weight: 74.9 kg 70.3 kg 74.2 kg    Examination:  General exam: Appears calm and comfortable  Respiratory system: Clear to auscultation. Respiratory effort normal. Cardiovascular system: S1 & S2 heard, RRR. No JVD, murmurs, rubs, gallops or clicks. No pedal edema. Gastrointestinal system: Abdomen is nondistended, soft and nontender. No organomegaly or masses felt. Normal bowel sounds heard. Central nervous system: Alert and oriented. No focal neurological deficits. Extremities: No edema, no swelling, no cyanosis. Skin: No rashes, lesions or ulcers Psychiatry: Judgement and insight appear normal. Mood & affect appropriate.     Data Reviewed: I have personally reviewed following labs and imaging studies  CBC: Recent Labs  Lab 03/03/20 1249 03/04/20 0350  WBC 3.0* 3.5*  NEUTROABS 1.8  --   HGB 12.7* 12.1*  HCT 36.2* 34.4*  MCV 81.0 83.3  PLT 328 622   Basic Metabolic Panel: Recent Labs  Lab 03/03/20 1249 03/04/20 0350  NA 133* 136  K 3.1* 3.5  CL 100 103  CO2 22 19*  GLUCOSE 255* 163*  BUN 27* 22  CREATININE 1.07 0.99  CALCIUM 8.3* 7.9*   GFR: Estimated Creatinine Clearance: 74.9 mL/min (by C-G formula based on SCr of 0.99 mg/dL). Liver Function Tests: Recent Labs  Lab 03/03/20 1249 03/04/20 0350  AST 27 27  ALT 69* 62*  ALKPHOS 48 43  BILITOT 1.7* 1.4*  PROT 6.2* 5.7*  ALBUMIN 3.1* 2.8*   No results for input(s): LIPASE, AMYLASE in the last 168 hours. No results for input(s): AMMONIA in the last 168 hours. Coagulation Profile: No results for input(s): INR, PROTIME in the last 168 hours. Cardiac Enzymes: No results for input(s): CKTOTAL, CKMB, CKMBINDEX, TROPONINI in the last 168 hours. BNP (last 3 results) No results for input(s): PROBNP in the last 8760 hours. HbA1C: Recent Labs    03/03/20 1249  HGBA1C 9.1*   CBG: Recent Labs  Lab 03/03/20 1652 03/03/20 2104  03/04/20 0726 03/04/20 1136  GLUCAP 195* 142* 160* 224*   Lipid Profile: No results for input(s): CHOL, HDL, LDLCALC, TRIG, CHOLHDL, LDLDIRECT in the last 72 hours. Thyroid Function Tests: No results for input(s): TSH, T4TOTAL, FREET4, T3FREE, THYROIDAB in the last 72 hours. Anemia Panel: No results for input(s): VITAMINB12, FOLATE, FERRITIN, TIBC, IRON, RETICCTPCT in the last 72 hours. Sepsis Labs: No results for input(s): PROCALCITON, LATICACIDVEN in the last 168 hours.  Recent Results (from the past 240 hour(s))  SARS Coronavirus 2 by RT PCR (hospital order, performed in [facility] hospital lab) Nasopharyngeal Nasopharyngeal Swab     Status: None   Collection Time: 03/03/20  3:39 PM   Specimen: Nasopharyngeal Swab  Result Value Ref Range Status   SARS Coronavirus 2 NEGATIVE NEGATIVE Final    Comment: (NOTE) SARS-CoV-2 target nucleic acids are NOT DETECTED.  The SARS-CoV-2 RNA is generally detectable in upper and lower respiratory specimens during the acute phase of infection. The lowest concentration of SARS-CoV-2 viral copies this assay can detect is 250 copies / mL. A negative result does not preclude SARS-CoV-2 infection and should not be  used as the sole basis for treatment or other patient management decisions.  A negative result may occur with improper specimen collection / handling, submission of specimen other than nasopharyngeal swab, presence of viral mutation(s) within the areas targeted by this assay, and inadequate number of viral copies (<250 copies / mL). A negative result must be combined with clinical observations, patient history, and epidemiological information.  Fact Sheet for Patients:   StrictlyIdeas.no  Fact Sheet for Healthcare Providers: BankingDealers.co.za  This test is not yet approved or  cleared by the [place] FDA and has been authorized for detection and/or diagnosis of SARS-CoV-2 by FDA  under an Emergency Use Authorization (EUA).  This EUA will remain in effect (meaning this test can be used) for the duration of the COVID-19 declaration under Section 564(b)(1) of the Act, 21 U.S.C. section 360bbb-3(b)(1), unless the authorization is terminated or revoked sooner.  Performed at [facility], [place] [address] [place], [place] [address]     Radiology Studies: No results found.  Scheduled Meds: . Chlorhexidine Gluconate Cloth  6 each Topical Daily  . docusate sodium  100 mg Oral BID  . DULoxetine  20 mg Oral BID  . enoxaparin (LOVENOX) injection  40 mg Subcutaneous Q24H  . fentaNYL  1 patch Transdermal Q72H  . insulin aspart  0-9 Units Subcutaneous TID WC  . potassium chloride  40 mEq Oral Once  . prochlorperazine  20 mg Oral q AM   Continuous Infusions: . sodium chloride 75 mL/hr at 03/03/20 1726  . cefTRIAXone (ROCEPHIN)  IV 1 g (03/04/20 1020)  . fluconazole (DIFLUCAN) IV 200 mg (03/04/20 0855)     LOS: 1 day    Time spent: 25 mins.   Shawna Clamp, MD [facility]   If 7PM-7AM, please contact night-coverage

## 2020-03-04 NOTE — Evaluation (Signed)
Occupational Therapy Evaluation Patient Details Name: Danny Wood MRN: [id number] DOB: September 11, 1950 Today's Date: 03/04/2020    History of Present Illness Danny Wood is a 69 y.o. male with medical history significant of relapsed, metastatic bladder cancer  s/p cystectomy with ileal conduit from 2016, partial right nephrectomy who recently had a relapse of bladder cancer and restarted chemo therapy about 3 weeks ago. He has not been doing well with treatments, has developed persistent nausea, difficulty swallowing, generalized weakness and weight loss.   Clinical Impression   Danny Wood is a 69 year old man who presents with generalized weakness after 3 weeks of chemo. On evaluation reports feeling weak but demonstrates normal ROM and strength of upper extremities, functional mobility and ability to perform ADLs without assistance. Patient will return home with wife. No OT needs at this time.    Follow Up Recommendations  No OT follow up    Equipment Recommendations  None recommended by OT    Recommendations for Other Services       Precautions / Restrictions Precautions Precautions: Fall Restrictions Weight Bearing Restrictions: No      Mobility Bed Mobility Overal bed mobility: Modified Independent             General bed mobility comments: No physical assistance needed. Patient reports feeling "wobbly" when he hasn't eaten.  Transfers                 General transfer comment: Demonstrates functional mobiilty in hallway. See PT note for further details.    Balance Overall balance assessment: Mild deficits observed, not formally tested                                         ADL either performed or assessed with clinical judgement   ADL Overall ADL's : Independent                                       General ADL Comments: Pat[place] independent with dressing and toileting.     Vision  Patient Visual Report: Blurring of vision (Reports intermittent blurry vision. Reports his vision "is sharp" and colors are vivid. Wife reports he has been having hallucinations and other symptoms due to medication.)       Perception     Praxis      Pertinent Vitals/Pain Pain Assessment: No/denies pain     Hand Dominance Right   Extremity/Trunk Assessment Upper Extremity Assessment Upper Extremity Assessment: Overall WFL for tasks assessed   Lower Extremity Assessment Lower Extremity Assessment: Defer to PT evaluation   Cervical / Trunk Assessment Cervical / Trunk Assessment: Normal   Communication Communication Communication: No difficulties   Cognition Arousal/Alertness: Awake/alert Behavior During Therapy: WFL for tasks assessed/performed Overall Cognitive Status: Impaired/Different from baseline Area of Impairment: Memory                     Memory: Decreased short-term memory         General Comments: pt rporting colors seem more vivid and vision more sharp.    General Comments       Exercises     Shoulder Instructions      Home Living Family/patient expects to be discharged to:: Private residence Living Arrangements: Spouse/significant other Available Help at Discharge: Family  Type of Home: House Home Access: Stairs to enter [employer] of Steps: 2.5   Home Layout: Multi-level;Able to live on main level with bedroom/bathroom Alternate Level Stairs-Number of Steps: 7+landing+7 Alternate Level Stairs-Rails: Right;Left;Can reach both Bathroom Shower/Tub: Occupational psychologist: Standard Bathroom Accessibility: Yes   Home Equipment: Environmental consultant - 4 wheels;Cane - single point (electric bed, )   Additional Comments: toilets in alcove to use walls to rise. wife and daughter home to help.       Prior Functioning/Environment Level of Independence: Independent                 OT Problem List:        OT  Treatment/Interventions:      OT Goals(Current goals can be found in the care plan section) Acute Rehab OT Goals OT Goal Formulation: All assessment and education complete, DC therapy  OT Frequency:     Barriers to D/C:            Co-evaluation PT/OT/SLP Co-Evaluation/Treatment: Yes Reason for Co-Treatment: To address functional/ADL transfers;For patient/therapist safety (co-eval) PT goals addressed during session: Mobility/safety with mobility OT goals addressed during session: ADL's and self-care      AM-PAC OT "6 Clicks" Daily Activity     Outcome Measure Help from another person eating meals?: None Help from another person taking care of personal grooming?: None Help from another person toileting, which includes using toliet, bedpan, or urinal?: None Help from another person bathing (including washing, rinsing, drying)?: None Help from another person to put on and taking off regular upper body clothing?: None Help from another person to put on and taking off regular lower body clothing?: None 6 Click Score: 24   End of Session Equipment Utilized During Treatment: Gait belt Nurse Communication: Mobility status  Activity Tolerance: Patient tolerated treatment well Patient left: with family/visitor present (in bathroom.)  OT Visit Diagnosis: Unsteadiness on feet (R26.81);Muscle weakness (generalized) (M62.81)                Time: 1740-8144 OT Time Calculation (min): 25 min Charges:  OT General Charges $OT Visit: 1 Visit OT Evaluation $OT Eval Low Complexity: 1 Low  Tytionna Cloyd, OTR/L [place]  Office [phone] Pager: [place] 03/04/2020, 1:53 PM

## 2020-03-04 NOTE — Consult Note (Signed)
Referral MD  Reason for Referral: Weakness, dehydration, nausea and vomiting; metastatic bladder cancer.  Chief Complaint  Patient presents with  . Weakness  . Nausea  : I just cannot eat anything.  HPI: [name] is a very nice 69 year old white male.  He is originally from [place].  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  He also was in the [employer].  He was in the [employer].  He has metastatic bladder cancer.  He is followed quite expertly by Dr. Alen Wood.  He has been on different courses of chemotherapy.  He currently is on Padcev.  I think he received a dose a couple weeks ago.  He has been seen in the cancer center yesterday.  He was having problems with dehydration.  He was having nausea and vomiting.  Ultimately, he had to be admitted.  When he came in, his white cell count was 3.  Hemoglobin 12.7.  Platelet count 328,000.  His sodium is 133.  Potassium 3.1.  Glucose 255.  His creatinine 1.07.  Calcium 8.3 with an albumin of 3.1.  He has lost quite a bit of weight.  He says he just is hard time eating.  He is also having some diarrhea.  There is no bleeding.  He is on antibiotics right now.  Cultures are pending.  He has quite a bit of thrush in his oral cavity.  I am sure this is from the hyperglycemia.  Currently, I was his performance status is probably ECOG 2.    Past Medical History:  Diagnosis Date  . At risk for sleep apnea    STOP-BANG= 4       SENT TO PCP 05-16-2014  . bladder ca dx'd 05/17/14   met dz 12/2017  . Bladder disorder   . Hematuria   . History of renal cell carcinoma    2003  S/P  PARTIAL RIGHT NEPHRECTOMY  . Presence of surgical incision    lumbar diskectomy 05-11-2014-  bandage present  . Urgency of urination   :  Past Surgical History:  Procedure Laterality Date  . CYSTOSCOPY N/A 09/09/2014   Procedure: CYSTOSCOPY WITH INDOCYANINE GREEN DYE INJECTION AND URETHRAL DILATION;   Surgeon: Alexis Frock, MD;  Location: [facility];  Service: Urology;  Laterality: N/A;  . CYSTOSCOPY WITH BIOPSY N/A 05/17/2014   Procedure: CYSTO WITH BLADDER BIOPSY;  Surgeon: Malka So, MD;  Location: [facility];  Service: Urology;  Laterality: N/A;  . EVALUATION UNDER ANESTHESIA WITH HEMORRHOIDECTOMY  07/07/2012   Procedure: EXAM UNDER ANESTHESIA WITH HEMORRHOIDECTOMY;  Surgeon: Joyice Faster. Cornett, MD;  Location: [facility];  Service: General;  Laterality: N/A;  . IR IMAGING GUIDED PORT INSERTION  02/20/2018  . LUMBAR MICRODISCECTOMY  05-11-2014   L4 -- L5 (partial)  . PARTIAL NEPHRECTOMY Right 2003  . ROBOT ASSISTED LAPAROSCOPIC COMPLETE CYSTECT ILEAL CONDUIT N/A 09/09/2014   Procedure: ROBOTIC ASSISTED LAPAROSCOPIC COMPLETE CYSTOPROSTATECTOMY,  ILEAL CONDUIT;  Surgeon: Alexis Frock, MD;  Location: [facility];  Service: Urology;  Laterality: N/A;  :   Current Facility-Administered Medications:  .  0.9 %  sodium chloride infusion, , Intravenous, Continuous, Shawna Clamp, MD, Last Rate: 75 mL/hr at 03/03/20 1726, New Bag at 03/03/20 1726 .  acetaminophen (TYLENOL) tablet 650 mg, 650 mg, Oral, Q6H PRN **OR** acetaminophen (TYLENOL) suppository 650 mg, 650 mg, Rectal, Q6H PRN, Shawna Clamp, MD .  cefTRIAXone (ROCEPHIN) 1 g in sodium chloride 0.9 %  100 mL IVPB, 1 g, Intravenous, Daily, Shawna Clamp, MD, Last Rate: 200 mL/hr at 03/03/20 1556, 1 g at 03/03/20 1556 .  Chlorhexidine Gluconate Cloth 2 % PADS 6 each, 6 each, Topical, Daily, Amin, Ankit Chirag, MD .  diphenoxylate-atropine (LOMOTIL) 2.5-0.025 MG per tablet 1-2 tablet, 1-2 tablet, Oral, QID PRN, Shawna Clamp, MD .  docusate sodium (COLACE) capsule 100 mg, 100 mg, Oral, BID, Shawna Clamp, MD .  DULoxetine (CYMBALTA) DR capsule 20 mg, 20 mg, Oral, BID, Shawna Clamp, MD .  enoxaparin (LOVENOX) injection 40 mg, 40 mg, Subcutaneous, Q24H, Shawna Clamp, MD, 40 mg at 03/03/20 2337 .  fentaNYL (DURAGESIC) 25 MCG/HR 1 patch,  1 patch, Transdermal, Q72H, Shawna Clamp, MD, 1 patch at 03/03/20 1719 .  fluconazole (DIFLUCAN) IVPB 200 mg, 200 mg, Intravenous, Q24H, Tekeyah Santiago R, MD .  insulin aspart (novoLOG) injection 0-9 Units, 0-9 Units, Subcutaneous, TID WC, Shawna Clamp, MD, 2 Units at 03/03/20 1720 .  potassium chloride (KLOR-CON) packet 40 mEq, 40 mEq, Oral, Once, Shawna Clamp, MD .  prochlorperazine (COMPAZINE) tablet 20 mg, 20 mg, Oral, q AM, Shawna Clamp, MD .  promethazine (PHENERGAN) tablet 12.5 mg, 12.5 mg, Oral, Q6H PRN, Shawna Clamp, MD .  senna-docusate (Senokot-S) tablet 1 tablet, 1 tablet, Oral, QHS PRN, Shawna Clamp, MD .  sodium chloride flush (NS) 0.9 % injection 10-40 mL, 10-40 mL, Intracatheter, PRN, Shawna Clamp, MD:  . Chlorhexidine Gluconate Cloth  6 each Topical Daily  . docusate sodium  100 mg Oral BID  . DULoxetine  20 mg Oral BID  . enoxaparin (LOVENOX) injection  40 mg Subcutaneous Q24H  . fentaNYL  1 patch Transdermal Q72H  . insulin aspart  0-9 Units Subcutaneous TID WC  . potassium chloride  40 mEq Oral Once  . prochlorperazine  20 mg Oral q AM  :  No Known Allergies:  Family History  Problem Relation Age of Onset  . Diabetes Neg Hx   :  Social History   Socioeconomic History  . Marital status: Married    Spouse name: Not on file  . Number of children: Not on file  . Years of education: Not on file  . Highest education level: Not on file  Occupational History  . Not on file  Tobacco Use  . Smoking status: Former Smoker    Packs/day: 0.50    Years: 25.00    Pack years: 12.50    Types: Cigarettes    Quit date: 06/02/2004    Years since quitting: 15.7  . Smokeless tobacco: Never Used  Substance and Sexual Activity  . Alcohol use: Yes    Alcohol/week: 2.0 standard drinks    Types: 2 Standard drinks or equivalent per week  . Drug use: No  . Sexual activity: Not on file  Other Topics Concern  . Not on file  Social History Narrative  . Not on  file   Social Determinants of Health   Financial Resource Strain:   . Difficulty of Paying Living Expenses:   Food Insecurity:   . Worried About Charity fundraiser in the Last Year:   . Arboriculturist in the Last Year:   Transportation Needs:   . Film/video editor (Medical):   Marland Kitchen Lack of Transportation (Non-Medical):   Physical Activity:   . Days of Exercise per Week:   . Minutes of Exercise per Session:   Stress:   . Feeling of Stress :   Social Connections:   .  Frequency of Communication with Friends and Family:   . Frequency of Social Gatherings with Friends and Family:   . Attends Religious Services:   . Active Member of Clubs or Organizations:   . Attends Archivist Meetings:   Marland Kitchen Marital Status:   Intimate Partner Violence:   . Fear of Current or Ex-Partner:   . Emotionally Abused:   Marland Kitchen Physically Abused:   . Sexually Abused:   :  Review of Systems  Constitutional: Positive for malaise/fatigue and weight loss.  Eyes: Negative.   Respiratory: Negative.   Cardiovascular: Negative.   Gastrointestinal: Positive for diarrhea, nausea and vomiting.  Genitourinary: Negative.   Musculoskeletal: Positive for joint pain and myalgias.  Skin: Negative.   Neurological: Positive for weakness.  Endo/Heme/Allergies: Negative.   Psychiatric/Behavioral: Negative.      Exam:  This is a thin white male in no obvious distress.  His vital signs show temperature of 98.5.  Pulse 101.  Blood pressure 145/76.  His head and neck exam shows oral thrush.  He has no adenopathy in the neck.  Lungs are clear bilaterally.  Cardiac exam tachycardic but regular.  Abdomen is soft.  There is no fluid wave.  There is no palpable mass.  There is no hepatosplenomegaly.  Extremities show some muscle atrophy in upper and lower extremities.  Neurological exam is nonfocal.  Skin exam shows some scattered ecchymoses.   Patient Vitals for the past 24 hrs:  BP Temp Temp src Pulse Resp SpO2  Height Weight  03/04/20 0248 -- -- -- -- -- -- -- 163 lb 9.3 oz (74.2 kg)  03/04/20 0015 (!) 145/76 98.5 F (36.9 C) -- (!) 101 20 97 % -- --  03/03/20 2102 (!) 154/75 98.2 F (36.8 C) -- 100 20 98 % -- --  03/03/20 1701 (!) 143/76 98.1 F (36.7 C) -- (!) 101 16 100 % -- --  03/03/20 1615 (!) 152/89 -- -- (!) 104 20 95 % -- --  03/03/20 1545 (!) 150/82 -- -- (!) 104 (!) 22 95 % -- --  03/03/20 1500 (!) 152/81 -- -- (!) 109 (!) 21 94 % -- --  03/03/20 1415 120/75 -- -- (!) 101 17 95 % -- --  03/03/20 1228 (!) 142/85 98 F (36.7 C) Oral (!) 103 (!) 21 96 % 6' 2"  (1.88 m) 155 lb (70.3 kg)  03/03/20 1226 -- -- -- -- -- -- 6' 1"  (1.854 m) 165 lb 0.2 oz (74.9 kg)     Recent Labs    03/03/20 1249 03/04/20 0350  WBC 3.0* 3.5*  HGB 12.7* 12.1*  HCT 36.2* 34.4*  PLT 328 295   Recent Labs    03/03/20 1249 03/04/20 0350  NA 133* 136  K 3.1* 3.5  CL 100 103  CO2 22 19*  GLUCOSE 255* 163*  BUN 27* 22  CREATININE 1.07 0.99  CALCIUM 8.3* 7.9*    Blood smear review: None  Pathology: None    Assessment and Plan: Danny Wood is a very nice 69 year old white male.  He has metastatic bladder cancer.  He is on systemic therapy with Padcev.  He is in with dehydration.  His oral thrush.  He has diarrhea.  We will start him on some IV Diflucan.  He really cannot take much in the way of p.o. right now.  He is getting IV fluids.  He is on IV antibiotics.  We will will have to see what cultures show.  We will follow  along.  Hopefully with resolution of the thrush, he will be able to eat better and maybe gained a little weight.  He certainly has had a tough time.  The weight loss is quite significant.  I know that he will get fantastic care from all the staff on [address].  Lattie Haw, MD  1 Chronicles 16:11

## 2020-03-04 NOTE — Evaluation (Signed)
Clinical/Bedside Swallow Evaluation Patient Details  Name: Danny Wood MRN: [id number] Date of Birth: February 18, 1951  Today's Date: 03/04/2020 Time: SLP Start Time (ACUTE ONLY): 5631 SLP Stop Time (ACUTE ONLY): 4970 SLP Time Calculation (min) (ACUTE ONLY): 25 min  Past Medical History:  Past Medical History:  Diagnosis Date  . At risk for sleep apnea    STOP-BANG= 4       SENT TO PCP 05-16-2014  . bladder ca dx'd 05/17/14   met dz 12/2017  . Bladder disorder   . Hematuria   . History of renal cell carcinoma    2003  S/P  PARTIAL RIGHT NEPHRECTOMY  . Presence of surgical incision    lumbar diskectomy 05-11-2014-  bandage present  . Urgency of urination    Past Surgical History:  Past Surgical History:  Procedure Laterality Date  . CYSTOSCOPY N/A 09/09/2014   Procedure: CYSTOSCOPY WITH INDOCYANINE GREEN DYE INJECTION AND URETHRAL DILATION;  Surgeon: Alexis Frock, MD;  Location: [facility];  Service: Urology;  Laterality: N/A;  . CYSTOSCOPY WITH BIOPSY N/A 05/17/2014   Procedure: CYSTO WITH BLADDER BIOPSY;  Surgeon: Malka So, MD;  Location: [facility];  Service: Urology;  Laterality: N/A;  . EVALUATION UNDER ANESTHESIA WITH HEMORRHOIDECTOMY  07/07/2012   Procedure: EXAM UNDER ANESTHESIA WITH HEMORRHOIDECTOMY;  Surgeon: Joyice Faster. Cornett, MD;  Location: [place];  Service: General;  Laterality: N/A;  . IR IMAGING GUIDED PORT INSERTION  02/20/2018  . LUMBAR MICRODISCECTOMY  05-11-2014   L4 -- L5 (partial)  . PARTIAL NEPHRECTOMY Right 2003  . ROBOT ASSISTED LAPAROSCOPIC COMPLETE CYSTECT ILEAL CONDUIT N/A 09/09/2014   Procedure: ROBOTIC ASSISTED LAPAROSCOPIC COMPLETE CYSTOPROSTATECTOMY,  ILEAL CONDUIT;  Surgeon: Alexis Frock, MD;  Location: [facility];  Service: Urology;  Laterality: N/A;   HPI:  [name] is a 69 y.o. male with medical history significant of relapsed, metastatic bladder cancer  s/p cystectomy with ileal conduit from 2016, partial right  nephrectomy who recently had a relapse of bladder cancer and restarted chemo therapy about 3 weeks ago. He has not been doing well with treatments, has developed persistent nausea, difficulty swallowing, generalized weakness and weight loss   Assessment / Plan / Recommendation Clinical Impression  Patient presents with a mild-moderate oropharyngeal dysphagia without overt s/s of aspiration or penetration with thin liquids or gelatin/puree. Patient exhibited delayed swallow initation, mild decrease in laryngeal elevation, multiple swallows and some delayed oral transit with gelatin. Patient also with confusion and will decline sips or bites unless encouraged by wife. He endorses feeling of globus sensation and difficulty in swallowing down more solid foods. SLP Visit Diagnosis: Dysphagia, unspecified (R13.10)    Aspiration Risk  Mild aspiration risk;Moderate aspiration risk    Diet Recommendation Thin liquid   Liquid Administration via: Cup;Straw Medication Administration: Crushed with puree Supervision: Full supervision/cueing for compensatory strategies;Staff to assist with self feeding Compensations: Minimize environmental distractions;Slow rate;Small sips/bites Postural Changes: Seated upright at 90 degrees;Remain upright for at least 30 minutes after po intake    Other  Recommendations Oral Care Recommendations: Oral care BID;Staff/trained caregiver to provide oral care   Follow up Recommendations 24 hour supervision/assistance;Other (comment) (TBD pending progress)      Frequency and Duration min 2x/week  1 week       Prognosis Prognosis for Safe Diet Advancement: Good      Swallow Study   General Date of Onset: 03/03/20 HPI: Danny Wood is a 69 y.o. male with medical history significant  of relapsed, metastatic bladder cancer  s/p cystectomy with ileal conduit from 2016, partial right nephrectomy who recently had a relapse of bladder cancer and restarted chemo therapy  about 3 weeks ago. He has not been doing well with treatments, has developed persistent nausea, difficulty swallowing, generalized weakness and weight loss Type of Study: Bedside Swallow Evaluation Previous Swallow Assessment: N/A Diet Prior to this Study: Thin liquids Temperature Spikes Noted: No Respiratory Status: Room air History of Recent Intubation: No Behavior/Cognition: Alert;Distractible;Lethargic/Drowsy;Impulsive;Confused;Requires cueing;Agitated Oral Cavity Assessment: Within Functional Limits Oral Care Completed by SLP: Yes Oral Cavity - Dentition: Adequate natural dentition Vision: Functional for self-feeding Self-Feeding Abilities: Needs set up;Needs assist Patient Positioning: Upright in bed Baseline Vocal Quality: Normal Volitional Cough: Cognitively unable to elicit Volitional Swallow: Unable to elicit    Oral/Motor/Sensory Function Overall Oral Motor/Sensory Function: Within functional limits   Ice Chips     Thin Liquid Thin Liquid: Impaired Presentation: Straw Pharyngeal  Phase Impairments: Multiple swallows;Suspected delayed Swallow;Decreased hyoid-laryngeal movement    Nectar Thick     Honey Thick     Puree Puree: Impaired Oral Phase Functional Implications: Prolonged oral transit Pharyngeal Phase Impairments: Multiple swallows;Suspected delayed Swallow;Decreased hyoid-laryngeal movement   Solid     Solid: Not tested        Sonia Baller, MA, CCC-SLP Speech Therapy [facility]

## 2020-03-05 LAB — COMPREHENSIVE METABOLIC PANEL
ALT: 57 U/L — ABNORMAL HIGH (ref 0–44)
AST: 24 U/L (ref 15–41)
Albumin: 2.6 g/dL — ABNORMAL LOW (ref 3.5–5.0)
Alkaline Phosphatase: 41 U/L (ref 38–126)
Anion gap: 8 (ref 5–15)
BUN: 16 mg/dL (ref 8–23)
CO2: 23 mmol/L (ref 22–32)
Calcium: 7.5 mg/dL — ABNORMAL LOW (ref 8.9–10.3)
Chloride: 107 mmol/L (ref 98–111)
Creatinine, Ser: 0.92 mg/dL (ref 0.61–1.24)
GFR calc Af Amer: >60 mL/min (ref >60)
GFR calc non Af Amer: >60 mL/min (ref >60)
Glucose, Bld: 154 mg/dL — ABNORMAL HIGH (ref 70–99)
Potassium: 3.2 mmol/L — ABNORMAL LOW (ref 3.5–5.1)
Sodium: 138 mmol/L (ref 135–145)
Total Bilirubin: 1.2 mg/dL (ref 0.3–1.2)
Total Protein: 5.3 g/dL — ABNORMAL LOW (ref 6.5–8.1)

## 2020-03-05 LAB — CBC WITH DIFFERENTIAL/PLATELET
Abs Immature Granulocytes: 0.03 10*3/uL (ref 0.00–0.07)
Basophils Absolute: 0 10*3/uL (ref 0.0–0.1)
Basophils Relative: 1 %
Eosinophils Absolute: 0 10*3/uL (ref 0.0–0.5)
Eosinophils Relative: 0 %
HCT: 34 % — ABNORMAL LOW (ref 39.0–52.0)
Hemoglobin: 11.9 g/dL — ABNORMAL LOW (ref 13.0–17.0)
Immature Granulocytes: 1 %
Lymphocytes Relative: 28 %
Lymphs Abs: 1 10*3/uL (ref 0.7–4.0)
MCH: 29 pg (ref 26.0–34.0)
MCHC: 35 g/dL (ref 30.0–36.0)
MCV: 82.7 fL (ref 80.0–100.0)
Monocytes Absolute: 0.5 10*3/uL (ref 0.1–1.0)
Monocytes Relative: 16 %
Neutro Abs: 1.9 10*3/uL (ref 1.7–7.7)
Neutrophils Relative %: 54 %
Platelets: 275 10*3/uL (ref 150–400)
RBC: 4.11 MIL/uL — ABNORMAL LOW (ref 4.22–5.81)
RDW: 12.3 % (ref 11.5–15.5)
WBC: 3.5 10*3/uL — ABNORMAL LOW (ref 4.0–10.5)
nRBC: 0 % (ref 0.0–0.2)

## 2020-03-05 LAB — GLUCOSE, CAPILLARY
Glucose-Capillary: 152 mg/dL — ABNORMAL HIGH (ref 70–99)
Glucose-Capillary: 183 mg/dL — ABNORMAL HIGH (ref 70–99)

## 2020-03-05 LAB — MAGNESIUM: Magnesium: 1.3 mg/dL — ABNORMAL LOW (ref 1.7–2.4)

## 2020-03-05 LAB — PHOSPHORUS: Phosphorus: 2.1 mg/dL — ABNORMAL LOW (ref 2.5–4.6)

## 2020-03-05 MED ORDER — HEPARIN SOD (PORK) LOCK FLUSH 100 UNIT/ML IV SOLN
500.0000 [IU] | INTRAVENOUS | Status: AC | PRN
  Administered 2020-03-05: 500 [IU]
  Filled 2020-03-05: qty 5

## 2020-03-05 MED ORDER — SODIUM CHLORIDE 0.9% FLUSH
10.0000 mL | INTRAVENOUS | Status: DC | PRN
  Administered 2020-03-05: 10 mL

## 2020-03-05 MED ORDER — SODIUM CHLORIDE 0.9% FLUSH
10.0000 mL | Freq: Two times a day (BID) | INTRAVENOUS | Status: DC
  Administered 2020-03-05: 10 mL

## 2020-03-05 MED ORDER — FLUCONAZOLE 100 MG PO TABS
100.0000 mg | ORAL_TABLET | Freq: Every day | ORAL | Status: DC
  Administered 2020-03-05: 100 mg via ORAL
  Filled 2020-03-05: qty 1

## 2020-03-05 MED ORDER — FLUCONAZOLE 100 MG PO TABS: 100.0000 mg | ORAL_TABLET | Freq: Every day | ORAL | 0 refills | Status: DC

## 2020-03-05 MED ORDER — KATE FARMS STANDARD 1.4 PO LIQD
325.0000 mL | Freq: Two times a day (BID) | ORAL | Status: DC
  Filled 2020-03-05: qty 325

## 2020-03-05 MED ORDER — POTASSIUM PHOSPHATES 15 MMOLE/5ML IV SOLN
30.0000 mmol | Freq: Once | INTRAVENOUS | Status: AC
  Administered 2020-03-05: 30 mmol via INTRAVENOUS
  Filled 2020-03-05: qty 10

## 2020-03-05 MED ORDER — ADULT MULTIVITAMIN W/MINERALS CH
1.0000 | ORAL_TABLET | Freq: Every day | ORAL | Status: DC
  Administered 2020-03-05: 1 via ORAL
  Filled 2020-03-05: qty 1

## 2020-03-05 MED ORDER — MAGNESIUM SULFATE 2 GM/50ML IV SOLN
2.0000 g | Freq: Once | INTRAVENOUS | Status: AC
  Administered 2020-03-05: 2 g via INTRAVENOUS
  Filled 2020-03-05: qty 50

## 2020-03-05 MED ORDER — BOOST / RESOURCE BREEZE PO LIQD CUSTOM: 1.0000 | ORAL | Status: DC

## 2020-03-05 MED ORDER — POTASSIUM CHLORIDE 20 MEQ PO PACK
40.0000 meq | PACK | Freq: Once | ORAL | Status: AC
  Administered 2020-03-05: 40 meq via ORAL
  Filled 2020-03-05: qty 2

## 2020-03-05 NOTE — Progress Notes (Signed)
Pt being discharged to home with wife. Discharge instructions and medication education provided to pt and wife at the bedside. RN answered and addressed all of pts and wife's questions and concerns.

## 2020-03-05 NOTE — Progress Notes (Signed)
Danny Wood is doing much better.  The thrush is gone.  He got IV Diflucan.  I would put him on oral Diflucan.  He is eating regular food now.  He is not having any dysphagia.  There is no nausea or vomiting.  He is having no diarrhea.  He really wants to go home.  I really think that he could go home.  His labs show white cell count 3.5.  Hemoglobin 11.9.  Platelet count 275,000.  His creatinine is 0.92.  His phosphorus is 2.1.  Magnesium was 1.3.  I will give him some IV magnesium and IV K-Phos.  He is out of bed.  He is having no problems with fever.  There is no bleeding.  His vital signs are temperature 98.3.  Pulse 106.  Blood pressure 158/85.  Head and neck exam shows no thrush in the oral cavity.  Lungs are clear.  Cardiac exam regular rate and rhythm with no murmurs, rubs or bruits.  Abdomen is soft.  He has decent bowel sounds.  There is no guarding or rebound tenderness.  Extremities shows no clubbing, cyanosis or edema.  Neurological exam is nonfocal.  Again, I would think Danny Wood would be able to go home.  He wants to go home.  He is eating regular food now.  I would just make sure that he gets the IV magnesium and K-Phos.  Also, make sure he is on Diflucan when he goes home.  He has hyperglycemia and this could cause the thrush to flare back up.  I appreciate the great care that he got from all the staff on 5 W.  Lattie Haw, MD  [name] 5:13

## 2020-03-05 NOTE — Discharge Summary (Signed)
Physician Discharge Summary  Ramadan Couey Luedke HBZ:[id number] DOB: Nov 30, 1950 DOA: 03/03/2020  PCP: [name], MD  Admit date: 03/03/2020   Discharge date: 03/05/2020  Admitted From: Home  Disposition:  Home  Recommendations for Outpatient Follow-up:  1. Follow up with PCP in 1-2 weeks 2. Please obtain BMP/CBC and UA in one week 3.   Follow-up with oncology as a scheduled.  Home Health:  No  Equipment/Devices: None  Discharge Condition: Stable CODE STATUS:Full code   Diet recommendation: Heart Healthy / Carb Modified / Regular / Dysphagia   Brief Summary / Hospital course: Danny Salinger Zinsmeisteris a 70 y.o.malewith medical history significant ofrelapsed, metastatic bladder cancer s/p cystectomy with ileal conduit from 2016,partial right nephrectomywhorecently had a relapse of bladder cancerand restarted chemo therapy about 3 weeks ago. He has not been doing well with treatments, has developedpersistent nausea, difficulty swallowing, generalizedweakness and weight loss.Wife was at bedside reports patient has been throwing up,has developed diarrhea after he started chemotherapy.  Patient was admitted for nausea and vomiting leading to dehydration.  He was given IV fluids,  speech and swallow evaluation completed , recommended thin liquids, patient was also found to have oral thrush and was started on Diflucan which has improved the dysphagia.  Nausea ,vomiting and diarrhea has resolved.  Patient has tolerated regular diet.  Patient feels better and wants to be discharged. Oncology consulted suggested Diflucan at discharge. He was  also found to have a UTI ,being treated with antibiotics(ceftriaxone for 3 days).  Patient denies any urinary symptoms at discharge.  He was found to have low magnesium, phosphorus and was given IV replacement before discharge.  Discharge Diagnoses:  Principal Problem:   Dysphagia Active Problems:   Bladder cancer ([place])   Port-A-Cath in  place   Diabetes ([place])   Nausea and vomiting   Hypokalemia  Nausea and vomiting associated with dysphagia: >>>Resolved Patient recently restarted chemotherapy for 3 weeks for relapsed bladder cancer. Not able to tolerate welland haddeveloped nausea, vomiting and diarrhea. Today feeling generally weak,tired and has difficulty swallowing. Continue Zofran and Phenergan for nausea vomiting Gentle hydration,normal saline at 75 cc for now tolerated clear liquid diet and advance as tolerated Speech and swallow evaluation, recommended thin liquids. He was found to have oral thrush was started on Diflucan. Dysphagia has resolved, patient is tolerating regular diet.  Hypokalemia: Resolved.  This could be secondary to nausea and vomiting leading to dehydration Potassium replacement in progress,recheck labs tomorrow morning  UTI: BOF:BPZWCHENIDP,OE+,UMPNT cell count 3.0. Denies any urinary symptoms, completed ceftriaxone for 3 days.  Dysphagia: Resolved This could be secondary to chemotherapy. Speech and swallow evaluation, Possible GI evaluation if dysphagia persist.  Leukopenia: This could be secondary to chemotherapy. Consider heme-onc consult  Hyperglycemia: Wife denies history of diabetes. Fingersticks 250+,start sliding scale Obtain hemoglobin A1c   RelapsedBladder cancer: heme-onc consulted.  No need to resume chemotherapy at this point.  Will reassess  Chronicpain syndrome: Continue fentanyl for chronic pain. Continue Lomotil for diarrhea.  Discharge Instructions  Discharge Instructions    Call MD for:  difficulty breathing, headache or visual disturbances   Complete by: As directed    Call MD for:  persistant dizziness or light-headedness   Complete by: As directed    Call MD for:  persistant nausea and vomiting   Complete by: As directed    Call MD for:  temperature >100.4   Complete by: As directed    Diet - low sodium heart healthy    Complete by:  As directed    Discharge instructions   Complete by: As directed    Advised to follow-up with primary care physician in 1 week. Advised to follow-up with oncologist as scheduled. Patient was found to have UTI has completed ceftriaxone for 3 days.   Increase activity slowly   Complete by: As directed      Allergies as of 03/05/2020   No Known Allergies     Medication List    STOP taking these medications   oxyCODONE 5 MG immediate release tablet Commonly known as: Oxy IR/ROXICODONE     TAKE these medications   Basaglar KwikPen 100 UNIT/ML Inject 16 Units into the skin every morning. **Replaces Lantus.**   diphenoxylate-atropine 2.5-0.025 MG tablet Commonly known as: LOMOTIL 1 to 2 PO QID prn diarrhea What changed:   how much to take  how to take this  when to take this  reasons to take this  additional instructions   DULoxetine 20 MG capsule Commonly known as: CYMBALTA Take 1 capsule (20 mg total) by mouth 2 (two) times daily.   fentaNYL 25 MCG/HR Commonly known as: [place] 1 patch onto the skin every 3 (three) days.   fluconazole 100 MG tablet Commonly known as: DIFLUCAN Take 1 tablet (100 mg total) by mouth daily. Start taking on: March 06, 2020   insulin lispro 100 UNIT/ML KwikPen Commonly known as: HumaLOG KwikPen Inject 0.02 mLs (2 Units total) into the skin 2 (two) times daily with a meal.   morphine 30 MG 12 hr tablet Commonly known as: MS CONTIN Take 1 tablet (30 mg total) by mouth every 12 (twelve) hours.   prochlorperazine 10 MG tablet Commonly known as: COMPAZINE TAKE 1 TABLET(10 MG) BY MOUTH EVERY 6 HOURS AS NEEDED FOR NAUSEA OR VOMITING What changed: See the new instructions.       Follow-up Information    [name], MD Follow up in 1 week(s).   Specialty: Family Medicine Contact information: [address], [place] [address] [phone]        Volanda Napoleon, MD Follow up in 1  week(s).   Specialty: Oncology Contact information: [place] [address] [phone]              No Known Allergies  Consultations:  Oncology   Procedures/Studies: CT Chest W Contrast  Result Date: 02/24/2020 CLINICAL DATA:  Follow-up urothelial carcinoma. Status post Cysto prostatectomy and nodal dissection. EXAM: CT CHEST WITH CONTRAST TECHNIQUE: Multidetector CT imaging of the chest was performed during intravenous contrast administration. CONTRAST:  32mL OMNIPAQUE IOHEXOL 300 MG/ML  SOLN COMPARISON:  CT AP 02/08/20 CT chest 09/17/2019 FINDINGS: Cardiovascular: The heart size appears normal. Aortic atherosclerosis. Lad and left circumflex coronary artery calcifications. Mediastinum/Nodes: Normal appearance of the thyroid gland. The trachea appears patent and is midline. Normal appearance of the esophagus. No enlarged axillary, supraclavicular, mediastinal, or hilar adenopathy. Lungs/Pleura: No pleural effusion. Previously noted cavitating nodule within the right lung apex has decreased in size in the interval. On today's study this measures 1.2 x 0.5 cm, image 26/5. New thin walled cyst within the medial right apex likely reflects a post inflammatory/infectious pneumatocele. Surrounding ground-glass attenuation compatible with residual changes due to inflammation/infection. Tiny nodule within the left apex measures 3 mm and is unchanged, image 20/5. Calcified perifissural nodule is identified in the superior segment of right lower lobe. Tiny nonspecific left upper lobe lung nodule is unchanged measuring 2 mm. Upper  Abdomen: No acute findings identified within the upper abdomen. Upper abdominal adenopathy is identified. Again seen is tumor metastasis within the upper abdomen extending from the porta hepatis along the celiac trunk and common hepatic artery origin. Small volume of perihepatic ascites is new from previous exam. Musculoskeletal: No chest wall  abnormality. No acute or significant osseous findings. IMPRESSION: 1. Interval decrease in size of right apical cavitary nodule with surrounding inflammatory changes. Favor inflammatory/infectious process 2. No specific findings identified to suggest metastatic disease within the chest. Scattered tiny lung nodules measuring 3 mm or less are unchanged in the interval. 3. Similar appearance of nodal metastasis within the upper abdomen. Electronically Signed   By: Kerby Moors M.D.   On: 02/24/2020 15:06   CT Abdomen Pelvis W Contrast  Result Date: 02/08/2020 CLINICAL DATA:  Invasive bladder cancer restaging, prior cysto prostatectomy. Ongoing chemotherapy. Umbilical and epigastric pain for 3 weeks with reduced appetite. Weight loss. EXAM: CT ABDOMEN AND PELVIS WITH CONTRAST TECHNIQUE: Multidetector CT imaging of the abdomen and pelvis was performed using the standard protocol following bolus administration of intravenous contrast. CONTRAST:  169mL OMNIPAQUE IOHEXOL 300 MG/ML  SOLN COMPARISON:  CT scan 09/17/2019 FINDINGS: Lower chest: Descending thoracic aortic atherosclerotic calcification. Hepatobiliary: Unremarkable Pancreas: Unremarkable Spleen: Unremarkable Adrenals/Urinary Tract: Both adrenal glands appear normal. Scarring in the right mid kidney, stable. Left mid kidney cyst posteriorly, benign appearance. Faint abnormal hypoenhancement in the right kidney lower pole on portal venous and delayed phase images, for example image 46/2, infiltrative process such as pyelonephritis cannot be excluded. Cystoprostatectomy with ileal conduit. No hydronephrosis. No hydroureter. No appreciable filling defect along the urothelium. Stomach/Bowel: Unremarkable Vascular/Lymphatic: Progressive adenopathy compatible with malignancy. In particular, there is indistinctly marginated tumor extending from the porta hepatis along the celiac trunk and common hepatic artery region, measuring approximately 4.5 by 2.6 by 2.8 cm on  image 26/2. Along the distal in the SMA in the root of the mesentery, a 3.3 by 2.4 by 4.0 cm tumor mass has substantially increased from prior. Progressive and pathologic retroperitoneal and pelvic adenopathy noted. Index left periaortic lymph node just below the renal vessels measures 1.4 cm in short axis on image 39/2, previously 0.8 cm by my measurements. Index right inguinal node 1.8 cm in short axis on image 84/2, formerly 1.3 cm. Left inguinal lymph nodes are also prominent. Reproductive: Cystoprostatectomy. Other: Small but increased amount of ascites including along the right paracolic gutter and in the lower anatomic pelvis. Musculoskeletal: Lumbar spondylosis and degenerative disc disease causing generally mild degrees of impingement at L2-3 and L3-4. IMPRESSION: 1. Worsening retroperitoneal and pelvic adenopathy compatible with progressive malignancy. Progressive mesenteric lesions adjacent to the celiac trunk and along the distal SMA compatible with malignancy. 2. Mild but abnormal hypoenhancement in the right kidney lower pole on portal venous and delayed phase images, possibly reflecting pyelonephritis. 3. Small but increased amount of ascites. 4. Other imaging findings of potential clinical significance: Lumbar spondylosis and degenerative disc disease causing generally mild degrees of impingement at L2-3 and L3-4. Aortic atherosclerosis. Aortic Atherosclerosis (ICD10-I70.0). Electronically Signed   By: Van Clines M.D.   On: 02/08/2020 13:03       Subjective: Patient was seen and examined at bedside.  No overnight events.  Nausea and vomiting has resolved,  dysphagia has resolved.  Patient has been eating regular diet.  There were some electrolyte changes which has been replaced.  Discharge Exam: Vitals:   03/04/20 2032 03/05/20 0506  BP: (!) 156/85 Marland Kitchen)  158/85  Pulse: (!) 106 (!) 106  Resp: 16 16  Temp: 98 F (36.7 C) 98.3 F (36.8 C)  SpO2: 98% 98%   Vitals:   03/04/20  1341 03/04/20 2032 03/05/20 0500 03/05/20 0506  BP: (!) 162/85 (!) 156/85  (!) 158/85  Pulse: (!) 104 (!) 106  (!) 106  Resp: 17 16  16   Temp: 99 F (37.2 C) 98 F (36.7 C)  98.3 F (36.8 C)  TempSrc: Oral     SpO2: 97% 98%  98%  Weight:   74 kg   Height:        General: Pt is alert, awake, not in acute distress Cardiovascular: RRR, S1/S2 +, no rubs, no gallops Respiratory: CTA bilaterally, no wheezing, no rhonchi Abdominal: Soft, NT, ND, bowel sounds + Extremities: no edema, no cyanosis    The results of significant diagnostics from this hospitalization (including imaging, microbiology, ancillary and laboratory) are listed below for reference.     Microbiology: Recent Results (from the past 240 hour(s))  SARS Coronavirus 2 by RT PCR (hospital order, performed in [facility] hospital lab) Nasopharyngeal Nasopharyngeal Swab     Status: None   Collection Time: 03/03/20  3:39 PM   Specimen: Nasopharyngeal Swab  Result Value Ref Range Status   SARS Coronavirus 2 NEGATIVE NEGATIVE Final    Comment: (NOTE) SARS-CoV-2 target nucleic acids are NOT DETECTED.  The SARS-CoV-2 RNA is generally detectable in upper and lower respiratory specimens during the acute phase of infection. The lowest concentration of SARS-CoV-2 viral copies this assay can detect is 250 copies / mL. A negative result does not preclude SARS-CoV-2 infection and should not be used as the sole basis for treatment or other patient management decisions.  A negative result may occur with improper specimen collection / handling, submission of specimen other than nasopharyngeal swab, presence of viral mutation(s) within the areas targeted by this assay, and inadequate number of viral copies (<250 copies / mL). A negative result must be combined with clinical observations, patient history, and epidemiological information.  Fact Sheet for Patients:   StrictlyIdeas.no  Fact Sheet for  Healthcare Providers: BankingDealers.co.za  This test is not yet approved or  cleared by the [place] FDA and has been authorized for detection and/or diagnosis of SARS-CoV-2 by FDA under an Emergency Use Authorization (EUA).  This EUA will remain in effect (meaning this test can be used) for the duration of the COVID-19 declaration under Section 564(b)(1) of the Act, 21 U.S.C. section 360bbb-3(b)(1), unless the authorization is terminated or revoked sooner.  Performed at [facility], [place] [address], [place], [place] [address]      Labs: BNP (last 3 results) No results for input(s): BNP in the last 8760 hours. Basic Metabolic Panel: Recent Labs  Lab 03/03/20 1249 03/04/20 0350 03/05/20 0500  NA 133* 136 138  K 3.1* 3.5 3.2*  CL 100 103 107  CO2 22 19* 23  GLUCOSE 255* 163* 154*  BUN 27* 22 16  CREATININE 1.07 0.99 0.92  CALCIUM 8.3* 7.9* 7.5*  MG  --   --  1.3*  PHOS  --   --  2.1*   Liver Function Tests: Recent Labs  Lab 03/03/20 1249 03/04/20 0350 03/05/20 0500  AST 27 27 24   ALT 69* 62* 57*  ALKPHOS 48 43 41  BILITOT 1.7* 1.4* 1.2  PROT 6.2* 5.7* 5.3*  ALBUMIN 3.1* 2.8* 2.6*   No results for input(s): LIPASE, AMYLASE in the last  168 hours. No results for input(s): AMMONIA in the last 168 hours. CBC: Recent Labs  Lab 03/03/20 1249 03/04/20 0350 03/05/20 0500  WBC 3.0* 3.5* 3.5*  NEUTROABS 1.8  --  1.9  HGB 12.7* 12.1* 11.9*  HCT 36.2* 34.4* 34.0*  MCV 81.0 83.3 82.7  PLT 328 295 275   Cardiac Enzymes: No results for input(s): CKTOTAL, CKMB, CKMBINDEX, TROPONINI in the last 168 hours. BNP: Invalid input(s): POCBNP CBG: Recent Labs  Lab 03/04/20 0726 03/04/20 1136 03/04/20 1621 03/05/20 0742 03/05/20 1136  GLUCAP 160* 224* 138* 152* 183*   D-Dimer No results for input(s): DDIMER in the last 72 hours. Hgb A1c Recent Labs    03/03/20 1249  HGBA1C 9.1*   Lipid Profile No results for  input(s): CHOL, HDL, LDLCALC, TRIG, CHOLHDL, LDLDIRECT in the last 72 hours. Thyroid function studies No results for input(s): TSH, T4TOTAL, T3FREE, THYROIDAB in the last 72 hours.  Invalid input(s): FREET3 Anemia work up No results for input(s): VITAMINB12, FOLATE, FERRITIN, TIBC, IRON, RETICCTPCT in the last 72 hours. Urinalysis    Component Value Date/Time   COLORURINE YELLOW 03/03/2020 1249   APPEARANCEUR CLOUDY (A) 03/03/2020 1249   LABSPEC 1.012 03/03/2020 1249   PHURINE 8.0 03/03/2020 1249   GLUCOSEU 50 (A) 03/03/2020 1249   HGBUR SMALL (A) 03/03/2020 1249   BILIRUBINUR NEGATIVE 03/03/2020 1249   KETONESUR 5 (A) 03/03/2020 1249   PROTEINUR 100 (A) 03/03/2020 1249   NITRITE NEGATIVE 03/03/2020 1249   LEUKOCYTESUR SMALL (A) 03/03/2020 1249   Sepsis Labs Invalid input(s): PROCALCITONIN,  WBC,  LACTICIDVEN Microbiology Recent Results (from the past 240 hour(s))  SARS Coronavirus 2 by RT PCR (hospital order, performed in [facility] hospital lab) Nasopharyngeal Nasopharyngeal Swab     Status: None   Collection Time: 03/03/20  3:39 PM   Specimen: Nasopharyngeal Swab  Result Value Ref Range Status   SARS Coronavirus 2 NEGATIVE NEGATIVE Final    Comment: (NOTE) SARS-CoV-2 target nucleic acids are NOT DETECTED.  The SARS-CoV-2 RNA is generally detectable in upper and lower respiratory specimens during the acute phase of infection. The lowest concentration of SARS-CoV-2 viral copies this assay can detect is 250 copies / mL. A negative result does not preclude SARS-CoV-2 infection and should not be used as the sole basis for treatment or other patient management decisions.  A negative result may occur with improper specimen collection / handling, submission of specimen other than nasopharyngeal swab, presence of viral mutation(s) within the areas targeted by this assay, and inadequate number of viral copies (<250 copies / mL). A negative result must be combined with  clinical observations, patient history, and epidemiological information.  Fact Sheet for Patients:   StrictlyIdeas.no  Fact Sheet for Healthcare Providers: BankingDealers.co.za  This test is not yet approved or  cleared by the [place] FDA and has been authorized for detection and/or diagnosis of SARS-CoV-2 by FDA under an Emergency Use Authorization (EUA).  This EUA will remain in effect (meaning this test can be used) for the duration of the COVID-19 declaration under Section 564(b)(1) of the Act, 21 U.S.C. section 360bbb-3(b)(1), unless the authorization is terminated or revoked sooner.  Performed at [facility], [place] [address], [place], [place] [address]      Time coordinating discharge: Over 30 minutes  SIGNED:   Shawna Clamp, MD  [facility] 03/05/2020, 1:59 PM Pager   If 7PM-7AM, please contact night-coverage [email or web address]

## 2020-03-05 NOTE — Discharge Instructions (Signed)
Advised to follow-up with primary care physician in 1 week. We will request primary care physician to check his CBC and BMP in 1 week. He has received ceftriaxone for 3 days completed course for UTI.

## 2020-03-05 NOTE — Progress Notes (Signed)
Initial Nutrition Assessment  DOCUMENTATION CODES:    (unable to assess for malnutrition at this time.)  INTERVENTION:  - will order Boost Breeze po TID, each supplement provides 250 kcal and 9 grams of protein - will order [employer] BID, each supplement provides 455 kcal and 20 grams protein. - will order 1 tablet multivitamin with minerals/day. - will complete NFPE at follow-up.  NUTRITION DIAGNOSIS:   Increased nutrient needs related to catabolic illness, chronic illness, cancer and cancer related treatments as evidenced by estimated needs.  GOAL:   Patient will meet greater than or equal to 90% of their needs  MONITOR:   PO intake, Supplement acceptance, Labs, Weight trends  REASON FOR ASSESSMENT:   Malnutrition Screening Tool  ASSESSMENT:   69 y.o. male with medical history of metastatic bladder cancer s/p cystectomy with ileal conduit in 2016, partial R nephrectomy who recently had a relapse of bladder cancer and restarted chemo therapy about 3 weeks ago. He has developed persistent nausea, difficulty swallowing, generalized weakness, and weight loss. His wife reported to ED staff that he has had diarrhea since starting chemo.  Patient ate 75% of breakfast this AM. Weight today is 163 lb and weight on 6/11 was 179 lb. This indicates 16 lb weight loss (8.9% body weight) in the past 1 month; significant for time frame.  Suspect patient meets criteria for malnutrition but unable to determine at this time.   Patient was seen by [place] RD on 6/28. At that time, patient was having normal BMs x1 week with the start of taking imodium. Patient had reported thick saliva. He was mainly eating items that required minimal to no chewing: yogurt, jello, baby food fruits and veggies, and protein shakes. Patient met criteria for severe malnutrition at that time. He was encouraged to consume oral nutrition supplements/protein shakes TID.    Labs reviewed; CBGs: 152 and 183 mg/dl,  K: 3.2 mmol/l, Ca: 7.5 mg/dl, Phos: 2.1 mg/dl, Mg: 1.3 mg/dl. Medications reviewed; 100 mg colace BID, sliding scale novolog, 2 g IV Mg sulfate x1 run 7/4, 40 mEq Klor-Con x1 dose 7/2, 30 mmol IV KPhos x1 run 7/4.  IVF; NS @ 75 ml/hr.    NUTRITION - FOCUSED PHYSICAL EXAM:  unable to complete at this time.   Diet Order:   Diet Order            DIET SOFT Room service appropriate? Yes; Fluid consistency: Thin  Diet effective now                 EDUCATION NEEDS:   No education needs have been identified at this time  Skin:  Skin Assessment: Reviewed RN Assessment  Last BM:  7/3  Height:   Ht Readings from Last 1 Encounters:  03/03/20 _0  (1.88 m)    Weight:   Wt Readings from Last 1 Encounters:  03/05/20 74 kg    Estimated Nutritional Needs:  Kcal:  2220-2450 kcal Protein:  110-125 grams Fluid:  >/= 2.5 L/day     Jarome Matin, MS, RD, LDN, CNSC Inpatient Clinical Dietitian RD pager # available in AMION  After hours/weekend pager # available in [facility]

## 2020-03-10 ENCOUNTER — Inpatient Hospital Stay: Payer: Medicare Other

## 2020-03-10 ENCOUNTER — Other Ambulatory Visit: Payer: PRIVATE HEALTH INSURANCE

## 2020-03-10 ENCOUNTER — Inpatient Hospital Stay: Payer: Medicare Other | Admitting: Nutrition

## 2020-03-10 ENCOUNTER — Inpatient Hospital Stay (HOSPITAL_BASED_OUTPATIENT_CLINIC_OR_DEPARTMENT_OTHER): Payer: Medicare Other | Admitting: Medical

## 2020-03-10 ENCOUNTER — Other Ambulatory Visit: Payer: Self-pay

## 2020-03-10 VITALS — BP 112/72 | HR 109 | Temp 97.7°F | Resp 19 | Ht 74.0 in | Wt 158.9 lb

## 2020-03-10 VITALS — BP 149/75 | HR 92 | Resp 18

## 2020-03-10 DIAGNOSIS — C679 Malignant neoplasm of bladder, unspecified: Secondary | ICD-10-CM

## 2020-03-10 DIAGNOSIS — G8929 Other chronic pain: Secondary | ICD-10-CM | POA: Diagnosis not present

## 2020-03-10 DIAGNOSIS — R918 Other nonspecific abnormal finding of lung field: Secondary | ICD-10-CM | POA: Diagnosis not present

## 2020-03-10 DIAGNOSIS — F3289 Other specified depressive episodes: Secondary | ICD-10-CM | POA: Diagnosis not present

## 2020-03-10 DIAGNOSIS — R531 Weakness: Secondary | ICD-10-CM | POA: Diagnosis not present

## 2020-03-10 DIAGNOSIS — Z95828 Presence of other vascular implants and grafts: Secondary | ICD-10-CM

## 2020-03-10 DIAGNOSIS — I9589 Other hypotension: Secondary | ICD-10-CM | POA: Diagnosis present

## 2020-03-10 DIAGNOSIS — R269 Unspecified abnormalities of gait and mobility: Secondary | ICD-10-CM | POA: Diagnosis not present

## 2020-03-10 DIAGNOSIS — R590 Localized enlarged lymph nodes: Secondary | ICD-10-CM | POA: Diagnosis not present

## 2020-03-10 DIAGNOSIS — I951 Orthostatic hypotension: Secondary | ICD-10-CM | POA: Diagnosis not present

## 2020-03-10 DIAGNOSIS — R443 Hallucinations, unspecified: Secondary | ICD-10-CM | POA: Diagnosis not present

## 2020-03-10 DIAGNOSIS — R188 Other ascites: Secondary | ICD-10-CM | POA: Diagnosis not present

## 2020-03-10 DIAGNOSIS — R739 Hyperglycemia, unspecified: Secondary | ICD-10-CM | POA: Diagnosis not present

## 2020-03-10 DIAGNOSIS — R112 Nausea with vomiting, unspecified: Secondary | ICD-10-CM | POA: Diagnosis not present

## 2020-03-10 DIAGNOSIS — R1310 Dysphagia, unspecified: Secondary | ICD-10-CM | POA: Diagnosis not present

## 2020-03-10 DIAGNOSIS — E860 Dehydration: Secondary | ICD-10-CM | POA: Diagnosis not present

## 2020-03-10 DIAGNOSIS — M47816 Spondylosis without myelopathy or radiculopathy, lumbar region: Secondary | ICD-10-CM | POA: Diagnosis not present

## 2020-03-10 DIAGNOSIS — I700 Atherosclerosis of aorta: Secondary | ICD-10-CM | POA: Diagnosis not present

## 2020-03-10 DIAGNOSIS — N390 Urinary tract infection, site not specified: Secondary | ICD-10-CM | POA: Diagnosis not present

## 2020-03-10 DIAGNOSIS — R05 Cough: Secondary | ICD-10-CM | POA: Diagnosis not present

## 2020-03-10 DIAGNOSIS — E876 Hypokalemia: Secondary | ICD-10-CM | POA: Diagnosis not present

## 2020-03-10 DIAGNOSIS — K219 Gastro-esophageal reflux disease without esophagitis: Secondary | ICD-10-CM | POA: Diagnosis not present

## 2020-03-10 DIAGNOSIS — R197 Diarrhea, unspecified: Secondary | ICD-10-CM | POA: Diagnosis not present

## 2020-03-10 DIAGNOSIS — Z5112 Encounter for antineoplastic immunotherapy: Secondary | ICD-10-CM | POA: Diagnosis not present

## 2020-03-10 DIAGNOSIS — F329 Major depressive disorder, single episode, unspecified: Secondary | ICD-10-CM | POA: Diagnosis not present

## 2020-03-10 DIAGNOSIS — D72819 Decreased white blood cell count, unspecified: Secondary | ICD-10-CM | POA: Diagnosis not present

## 2020-03-10 DIAGNOSIS — M5136 Other intervertebral disc degeneration, lumbar region: Secondary | ICD-10-CM | POA: Diagnosis not present

## 2020-03-10 LAB — CMP (CANCER CENTER ONLY)
ALT: 48 U/L — ABNORMAL HIGH (ref 0–44)
AST: 29 U/L (ref 15–41)
Albumin: 2.9 g/dL — ABNORMAL LOW (ref 3.5–5.0)
Alkaline Phosphatase: 60 U/L (ref 38–126)
Anion gap: 10 (ref 5–15)
BUN: 12 mg/dL (ref 8–23)
CO2: 25 mmol/L (ref 22–32)
Calcium: 8.2 mg/dL — ABNORMAL LOW (ref 8.9–10.3)
Chloride: 100 mmol/L (ref 98–111)
Creatinine: 0.94 mg/dL (ref 0.61–1.24)
GFR, Est AFR Am: >60 mL/min (ref >60)
GFR, Estimated: >60 mL/min (ref >60)
Glucose, Bld: 195 mg/dL — ABNORMAL HIGH (ref 70–99)
Potassium: 3.5 mmol/L (ref 3.5–5.1)
Sodium: 135 mmol/L (ref 135–145)
Total Bilirubin: 0.6 mg/dL (ref 0.3–1.2)
Total Protein: 5.9 g/dL — ABNORMAL LOW (ref 6.5–8.1)

## 2020-03-10 LAB — CBC WITH DIFFERENTIAL (CANCER CENTER ONLY)
Abs Immature Granulocytes: 0.03 10*3/uL (ref 0.00–0.07)
Basophils Absolute: 0 10*3/uL (ref 0.0–0.1)
Basophils Relative: 1 %
Eosinophils Absolute: 0 10*3/uL (ref 0.0–0.5)
Eosinophils Relative: 0 %
HCT: 38.3 % — ABNORMAL LOW (ref 39.0–52.0)
Hemoglobin: 13.6 g/dL (ref 13.0–17.0)
Immature Granulocytes: 1 %
Lymphocytes Relative: 17 %
Lymphs Abs: 1 10*3/uL (ref 0.7–4.0)
MCH: 29.2 pg (ref 26.0–34.0)
MCHC: 35.5 g/dL (ref 30.0–36.0)
MCV: 82.4 fL (ref 80.0–100.0)
Monocytes Absolute: 0.6 10*3/uL (ref 0.1–1.0)
Monocytes Relative: 10 %
Neutro Abs: 4 10*3/uL (ref 1.7–7.7)
Neutrophils Relative %: 71 %
Platelet Count: 233 10*3/uL (ref 150–400)
RBC: 4.65 MIL/uL (ref 4.22–5.81)
RDW: 13.4 % (ref 11.5–15.5)
WBC Count: 5.6 10*3/uL (ref 4.0–10.5)
nRBC: 0 % (ref 0.0–0.2)

## 2020-03-10 MED ORDER — CIPROFLOXACIN HCL 500 MG PO TABS: 500.0000 mg | ORAL_TABLET | Freq: Two times a day (BID) | ORAL | 0 refills | Status: AC

## 2020-03-10 MED ORDER — HEPARIN SOD (PORK) LOCK FLUSH 100 UNIT/ML IV SOLN
500.0000 [IU] | Freq: Once | INTRAVENOUS | Status: AC
  Administered 2020-03-10: 500 [IU]
  Filled 2020-03-10: qty 5

## 2020-03-10 MED ORDER — SODIUM CHLORIDE 0.9 % IV SOLN
Freq: Once | INTRAVENOUS | Status: AC
  Filled 2020-03-10: qty 250

## 2020-03-10 MED ORDER — SODIUM CHLORIDE 0.9% FLUSH
10.0000 mL | Freq: Once | INTRAVENOUS | Status: AC
  Administered 2020-03-10: 10 mL
  Filled 2020-03-10: qty 10

## 2020-03-10 MED ORDER — SERTRALINE HCL 25 MG PO TABS: 25.0000 mg | ORAL_TABLET | Freq: Every day | ORAL | 1 refills | Status: DC

## 2020-03-10 NOTE — Progress Notes (Signed)
[facility] Progress Note   Danny Wood [id number] February 24, 1951 69 y.o.  Danny Wood is managed by Dr. Alen Blew  Actively treated with chemotherapy/immunotherapy/hormonal therapy: yes  Current therapy: Padcev  Last treated: 02/25/20 (cycle 1, day 15)  Next scheduled appointment with provider: 03/10/2020  Assessment: Plan:    Other specified hypotension - Plan: 0.9 %  sodium chloride infusion  Other depression - Plan: sertraline (ZOLOFT) 25 MG tablet  Malignant neoplasm of urinary bladder, unspecified site (HCC)   Orthostatic hypotension.  The patient was given 1 L of normal saline IV today.  He was encouraged to increase his fluid intake by 50% he and his wife expressed understanding and agreement with this plan.  Depression: The patient had previously been on Cymbalta 20 mg twice daily but stopped this medication due to its undesirable aftertaste.  He is agreeable to begin Zoloft 25 mg once daily with plans to titrate until the medication is effective.  Metastatic malignant neoplasm of the urinary bladder: The patient continues to be managed by Dr. Alen Blew and is status post cycle 1, day 15 of Padcev which was dosed on 02/25/2020.  He is status post a recent hospitalization from 03/03/2020 through 03/05/2020.  During his hospitalization he was treated/followed for nausea and vomiting with dysphagia, hypokalemia, a urinary tract infection, leukopenia, thrush, chronic pain and hyperglycemia.  He wishes to hold his treatment today and would like to speak with Dr. Alen Blew next week on his return on 03/17/2020 to discuss other treatment options.  The patient is desiring to discontinue Padcev.  Please see After Visit Summary for patient specific instructions.  Future Appointments  Date Time Provider [place]  03/17/2020 11:00 AM [facility]FLUSH [facility] None  03/17/2020 11:15 AM [facility] FLUSH [facility] None  03/17/2020 12:30 PM  [facility] INFUSION [facility] None  03/24/2020 11:15 AM [facility]FLUSH [facility] None  03/24/2020 11:30 AM [facility] [place] FLUSH [facility] None  03/24/2020 12:30 PM [facility] INFUSION [facility] None  03/28/2020  2:45 PM [facility] LAB 2 [facility] None  03/28/2020  3:00 PM [facility] INFUSION [facility] None  03/28/2020  3:30 PM Shadad, Mathis Dad, MD [facility] None    No orders of the defined types were placed in this encounter.      Subjective:   Patient ID:  Danny Wood is a 69 y.o. (DOB November 11, 1950) male.  Chief Complaint:  Chief Complaint  Patient presents with  . Follow-up    HPI Danny Wood is a 69 year old male with a diagnosis of a metastatic malignant neoplasm of the urinary bladder: He continues to be managed by Dr. Alen Blew and is status post cycle 1, day 15 of Padcev which was dosed on 02/25/2020.  He is status post a recent hospitalization from 07/02 through 03/05/2020.  During his hospitalization he was seen and managed/followed for nausea and vomiting, dysphagia, hypokalemia, UTI, leukopenia, hyperglycemia, chronic pain and thrush.  He presents to the clinic today with his wife.  His extensive discharge summary was reviewed.  He and his wife report that he is eating and drinking better but not to the point where he should be.  He continues to be weak and has positional dizziness.  He would like to speak to Dr. Alen Blew about discontinuing Padcev and transitioning to another therapy as he felt that Padcev was to difficult for him.  His swallowing has improved.  He was started on a liquid diet while he was in the hospital.  This has been progressed to a  regular diet.  Medications: I have reviewed the patient's current medications.  Allergies: No Known Allergies  Past Medical History:  Diagnosis Date  . At risk for sleep apnea    STOP-BANG= 4       SENT TO PCP 05-16-2014  . bladder ca dx'd 05/17/14   met dz 12/2017  . Bladder disorder   .  Hematuria   . History of renal cell carcinoma    2003  S/P  PARTIAL RIGHT NEPHRECTOMY  . Presence of surgical incision    lumbar diskectomy 05-11-2014-  bandage present  . Urgency of urination     Past Surgical History:  Procedure Laterality Date  . CYSTOSCOPY N/A 09/09/2014   Procedure: CYSTOSCOPY WITH INDOCYANINE GREEN DYE INJECTION AND URETHRAL DILATION;  Surgeon: Alexis Frock, MD;  Location: [facility];  Service: Urology;  Laterality: N/A;  . CYSTOSCOPY WITH BIOPSY N/A 05/17/2014   Procedure: CYSTO WITH BLADDER BIOPSY;  Surgeon: Malka So, MD;  Location: [facility];  Service: Urology;  Laterality: N/A;  . EVALUATION UNDER ANESTHESIA WITH HEMORRHOIDECTOMY  07/07/2012   Procedure: EXAM UNDER ANESTHESIA WITH HEMORRHOIDECTOMY;  Surgeon: Joyice Faster. Cornett, MD;  Location: [place];  Service: General;  Laterality: N/A;  . IR IMAGING GUIDED PORT INSERTION  02/20/2018  . LUMBAR MICRODISCECTOMY  05-11-2014   L4 -- L5 (partial)  . PARTIAL NEPHRECTOMY Right 2003  . ROBOT ASSISTED LAPAROSCOPIC COMPLETE CYSTECT ILEAL CONDUIT N/A 09/09/2014   Procedure: ROBOTIC ASSISTED LAPAROSCOPIC COMPLETE CYSTOPROSTATECTOMY,  ILEAL CONDUIT;  Surgeon: Alexis Frock, MD;  Location: [facility];  Service: Urology;  Laterality: N/A;    Family History  Problem Relation Age of Onset  . Diabetes Neg Hx     Social History   Socioeconomic History  . Marital status: Married    Spouse name: Not on file  . Number of children: Not on file  . Years of education: Not on file  . Highest education level: Not on file  Occupational History  . Not on file  Tobacco Use  . Smoking status: Former Smoker    Packs/day: 0.50    Years: 25.00    Pack years: 12.50    Types: Cigarettes    Quit date: 06/02/2004    Years since quitting: 15.7  . Smokeless tobacco: Never Used  Substance and Sexual Activity  . Alcohol use: Yes    Alcohol/week: 2.0 standard drinks    Types: 2 Standard drinks or equivalent per week  . Drug  use: No  . Sexual activity: Not on file  Other Topics Concern  . Not on file  Social History Narrative  . Not on file   Social Determinants of Health   Financial Resource Strain:   . Difficulty of Paying Living Expenses:   Food Insecurity:   . Worried About Charity fundraiser in the Last Year:   . Arboriculturist in the Last Year:   Transportation Needs:   . Film/video editor (Medical):   Marland Kitchen Lack of Transportation (Non-Medical):   Physical Activity:   . Days of Exercise per Week:   . Minutes of Exercise per Session:   Stress:   . Feeling of Stress :   Social Connections:   . Frequency of Communication with Friends and Family:   . Frequency of Social Gatherings with Friends and Family:   . Attends Religious Services:   . Active Member of Clubs or Organizations:   . Attends Archivist Meetings:   .  Marital Status:   Intimate Partner Violence:   . Fear of Current or Ex-Partner:   . Emotionally Abused:   Marland Kitchen Physically Abused:   . Sexually Abused:     Past Medical History, Surgical history, Social history, and Family history were reviewed and updated as appropriate.   Please see review of systems for further details on the patient's review from today.   Review of Systems:  Review of Systems  Constitutional: Positive for appetite change and fatigue. Negative for activity change, chills, diaphoresis and fever.  HENT: Negative for trouble swallowing.   Respiratory: Negative for cough, chest tightness and shortness of breath.   Cardiovascular: Negative for chest pain, palpitations and leg swelling.  Gastrointestinal: Negative for abdominal distention, abdominal pain, constipation, diarrhea, nausea and vomiting.  Musculoskeletal: Negative for gait problem.  Neurological: Positive for dizziness and weakness.    Objective:   Physical Exam:  BP 112/72 (BP Location: Left Arm, Patient Position: Sitting)   Pulse (!) 109 Comment: notified nurse  Temp 97.7 F (36.5  C) (Temporal)   Resp 19   Ht _0  (1.88 m)   Wt 158 lb 14.4 oz (72.1 kg)   SpO2 97%   BMI 20.40 kg/m  ECOG: 1  Orthostatic Blood Pressure: Blood pressure:   sitting 103/82, standing 59/36 Pulse:   sitting 112, standing 119    Physical Exam Constitutional:      General: He is not in acute distress.    Appearance: He is not diaphoretic.  HENT:     Head: Normocephalic and atraumatic.     Mouth/Throat:     Mouth: Mucous membranes are moist.     Pharynx: No oropharyngeal exudate or posterior oropharyngeal erythema.  Eyes:     General: No scleral icterus.       Right eye: No discharge.        Left eye: No discharge.     Conjunctiva/sclera: Conjunctivae normal.  Cardiovascular:     Rate and Rhythm: Regular rhythm. Tachycardia present.  Pulmonary:     Effort: Pulmonary effort is normal. No respiratory distress.     Breath sounds: Normal breath sounds. No wheezing, rhonchi or rales.  Abdominal:     General: Abdomen is flat. Bowel sounds are normal. There is no distension.     Palpations: Abdomen is soft.     Tenderness: There is no abdominal tenderness. There is no guarding.     Comments: A urostomy bag was noted in the right lower quadrant with clear yellow urine.  Musculoskeletal:     Right lower leg: No edema.     Left lower leg: No edema.  Lymphadenopathy:     Cervical: No cervical adenopathy.  Skin:    General: Skin is warm and dry.  Neurological:     Mental Status: He is alert.     Coordination: Coordination normal.     Gait: Gait normal.  Psychiatric:        Mood and Affect: Mood is depressed.     Lab Review:     Component Value Date/Time   NA 135 03/10/2020 0928   NA 139 08/05/2014 1335   K 3.5 03/10/2020 0928   K 4.3 08/05/2014 1335   CL 100 03/10/2020 0928   CO2 25 03/10/2020 0928   CO2 26 08/05/2014 1335   GLUCOSE 195 (H) 03/10/2020 0928   GLUCOSE 97 08/05/2014 1335   BUN 12 03/10/2020 0928   BUN 18.2 08/05/2014 1335   CREATININE 0.94  03/10/2020 4098  CREATININE 1.1 08/05/2014 1335   CALCIUM 8.2 (L) 03/10/2020 0928   CALCIUM 9.5 08/05/2014 1335   PROT 5.9 (L) 03/10/2020 0928   PROT 6.5 08/05/2014 1335   ALBUMIN 2.9 (L) 03/10/2020 0928   ALBUMIN 3.9 08/05/2014 1335   AST 29 03/10/2020 0928   AST 45 (H) 08/05/2014 1335   ALT 48 (H) 03/10/2020 0928   ALT 73 (H) 08/05/2014 1335   ALKPHOS 60 03/10/2020 0928   ALKPHOS 98 08/05/2014 1335   BILITOT 0.6 03/10/2020 0928   BILITOT 0.47 08/05/2014 1335   GFRNONAA >60 03/10/2020 0928   GFRAA >60 03/10/2020 0928       Component Value Date/Time   WBC 5.6 03/10/2020 0928   WBC 3.5 (L) 03/05/2020 0500   RBC 4.65 03/10/2020 0928   HGB 13.6 03/10/2020 0928   HGB 11.8 (L) 08/05/2014 1335   HCT 38.3 (L) 03/10/2020 0928   HCT 33.1 (L) 08/05/2014 1335   PLT 233 03/10/2020 0928   PLT 305 08/05/2014 1335   MCV 82.4 03/10/2020 0928   MCV 81.9 08/05/2014 1335   MCH 29.2 03/10/2020 0928   MCHC 35.5 03/10/2020 0928   RDW 13.4 03/10/2020 0928   RDW 13.7 08/05/2014 1335   LYMPHSABS 1.0 03/10/2020 0928   LYMPHSABS 1.9 08/05/2014 1335   MONOABS 0.6 03/10/2020 0928   MONOABS 1.0 (H) 08/05/2014 1335   EOSABS 0.0 03/10/2020 0928   EOSABS 0.1 08/05/2014 1335   BASOSABS 0.0 03/10/2020 0928   BASOSABS 0.0 08/05/2014 1335   -------------------------------  Imaging from last 24 hours (if applicable):  Radiology interpretation: CT Chest W Contrast  Result Date: 02/24/2020 CLINICAL DATA:  Follow-up urothelial carcinoma. Status post Cysto prostatectomy and nodal dissection. EXAM: CT CHEST WITH CONTRAST TECHNIQUE: Multidetector CT imaging of the chest was performed during intravenous contrast administration. CONTRAST:  67m OMNIPAQUE IOHEXOL 300 MG/ML  SOLN COMPARISON:  CT AP 02/08/20 CT chest 09/17/2019 FINDINGS: Cardiovascular: The heart size appears normal. Aortic atherosclerosis. Lad and left circumflex coronary artery calcifications. Mediastinum/Nodes: Normal appearance of the thyroid  gland. The trachea appears patent and is midline. Normal appearance of the esophagus. No enlarged axillary, supraclavicular, mediastinal, or hilar adenopathy. Lungs/Pleura: No pleural effusion. Previously noted cavitating nodule within the right lung apex has decreased in size in the interval. On today's study this measures 1.2 x 0.5 cm, image 26/5. New thin walled cyst within the medial right apex likely reflects a post inflammatory/infectious pneumatocele. Surrounding ground-glass attenuation compatible with residual changes due to inflammation/infection. Tiny nodule within the left apex measures 3 mm and is unchanged, image 20/5. Calcified perifissural nodule is identified in the superior segment of right lower lobe. Tiny nonspecific left upper lobe lung nodule is unchanged measuring 2 mm. Upper Abdomen: No acute findings identified within the upper abdomen. Upper abdominal adenopathy is identified. Again seen is tumor metastasis within the upper abdomen extending from the porta hepatis along the celiac trunk and common hepatic artery origin. Small volume of perihepatic ascites is new from previous exam. Musculoskeletal: No chest wall abnormality. No acute or significant osseous findings. IMPRESSION: 1. Interval decrease in size of right apical cavitary nodule with surrounding inflammatory changes. Favor inflammatory/infectious process 2. No specific findings identified to suggest metastatic disease within the chest. Scattered tiny lung nodules measuring 3 mm or less are unchanged in the interval. 3. Similar appearance of nodal metastasis within the upper abdomen. Electronically Signed   By: TKerby MoorsM.D.   On: 02/24/2020 15:06  This case was discussed Dr. Alen Blew. He expressed agreement with my management of this patient.

## 2020-03-10 NOTE — Progress Notes (Signed)
Hold chemotherapy today. Patient will receive 1 liter of normal saline only.  Thanks, Lucianne Lei

## 2020-03-10 NOTE — Patient Instructions (Signed)

## 2020-03-10 NOTE — Progress Notes (Signed)
Per Sandi Mealy PA patient will not receive treatment today but will instead get 1L NS over 1 hour.

## 2020-03-10 NOTE — Progress Notes (Signed)
Nutrition follow-up completed with patient during infusion for metastatic bladder cancer. Patient reports he does not feel well today. Weight has decreased and was documented as 163.14 pounds down from 165 pounds. Patient denies constipation or diarrhea. Nausea has improved however he still has occasional nausea.  He is trying to eat. Patient has enjoyed samples of [employer] shakes provided to him at last visit and would like some additional cartons today.  Nutrition diagnosis: Unintended weight loss continues.  Intervention: Provided support and encouragement for patient to continue strategies for adequate calorie and protein intake. Enforced importance of bowel regimen and nausea control. Provided patient with multiple samples of [employer] shakes and other oral nutrition supplements. I also was able to sign patient up for 3 free complementary cases of [employer] which will be shipped to him.  Monitoring, evaluation, goals: Patient will tolerate increased calories and protein to minimize further weight loss.  Next visit: To be scheduled as needed with upcoming treatments.  **Disclaimer: This note was dictated with voice recognition software. Similar sounding words can inadvertently be transcribed and this note may contain transcription errors which may not have been corrected upon publication of note.**

## 2020-03-10 NOTE — Progress Notes (Signed)
Pt seen by PA Van only, no assessment by [facility] RN at this time (time constraints).  PA aware. 

## 2020-03-10 NOTE — Patient Instructions (Signed)

## 2020-03-17 ENCOUNTER — Other Ambulatory Visit: Payer: Self-pay | Admitting: Oncology

## 2020-03-17 ENCOUNTER — Other Ambulatory Visit: Payer: Self-pay

## 2020-03-17 ENCOUNTER — Inpatient Hospital Stay: Payer: Medicare Other

## 2020-03-17 VITALS — BP 95/69 | HR 100 | Temp 98.3°F | Resp 18

## 2020-03-17 DIAGNOSIS — Z95828 Presence of other vascular implants and grafts: Secondary | ICD-10-CM

## 2020-03-17 DIAGNOSIS — C679 Malignant neoplasm of bladder, unspecified: Secondary | ICD-10-CM

## 2020-03-17 DIAGNOSIS — Z5112 Encounter for antineoplastic immunotherapy: Secondary | ICD-10-CM | POA: Diagnosis not present

## 2020-03-17 LAB — CBC WITH DIFFERENTIAL (CANCER CENTER ONLY)
Abs Immature Granulocytes: 0.03 10*3/uL (ref 0.00–0.07)
Basophils Absolute: 0.1 10*3/uL (ref 0.0–0.1)
Basophils Relative: 1 %
Eosinophils Absolute: 0 10*3/uL (ref 0.0–0.5)
Eosinophils Relative: 0 %
HCT: 43.5 % (ref 39.0–52.0)
Hemoglobin: 15.3 g/dL (ref 13.0–17.0)
Immature Granulocytes: 0 %
Lymphocytes Relative: 24 %
Lymphs Abs: 1.9 10*3/uL (ref 0.7–4.0)
MCH: 29.1 pg (ref 26.0–34.0)
MCHC: 35.2 g/dL (ref 30.0–36.0)
MCV: 82.9 fL (ref 80.0–100.0)
Monocytes Absolute: 0.6 10*3/uL (ref 0.1–1.0)
Monocytes Relative: 8 %
Neutro Abs: 5.2 10*3/uL (ref 1.7–7.7)
Neutrophils Relative %: 67 %
Platelet Count: 248 10*3/uL (ref 150–400)
RBC: 5.25 MIL/uL (ref 4.22–5.81)
RDW: 14.4 % (ref 11.5–15.5)
WBC Count: 7.8 10*3/uL (ref 4.0–10.5)
nRBC: 0 % (ref 0.0–0.2)

## 2020-03-17 LAB — CMP (CANCER CENTER ONLY)
ALT: 46 U/L — ABNORMAL HIGH (ref 0–44)
AST: 33 U/L (ref 15–41)
Albumin: 3.2 g/dL — ABNORMAL LOW (ref 3.5–5.0)
Alkaline Phosphatase: 66 U/L (ref 38–126)
Anion gap: 10 (ref 5–15)
BUN: 18 mg/dL (ref 8–23)
CO2: 23 mmol/L (ref 22–32)
Calcium: 8.8 mg/dL — ABNORMAL LOW (ref 8.9–10.3)
Chloride: 98 mmol/L (ref 98–111)
Creatinine: 1.04 mg/dL (ref 0.61–1.24)
GFR, Est AFR Am: >60 mL/min (ref >60)
GFR, Estimated: >60 mL/min (ref >60)
Glucose, Bld: 175 mg/dL — ABNORMAL HIGH (ref 70–99)
Potassium: 4.1 mmol/L (ref 3.5–5.1)
Sodium: 131 mmol/L — ABNORMAL LOW (ref 135–145)
Total Bilirubin: 0.7 mg/dL (ref 0.3–1.2)
Total Protein: 6.6 g/dL (ref 6.5–8.1)

## 2020-03-17 MED ORDER — SODIUM CHLORIDE 0.9% FLUSH
10.0000 mL | Freq: Once | INTRAVENOUS | Status: AC
  Administered 2020-03-17: 10 mL
  Filled 2020-03-17: qty 10

## 2020-03-17 MED ORDER — SODIUM CHLORIDE 0.9 % IV SOLN
Freq: Once | INTRAVENOUS | Status: AC
  Filled 2020-03-17: qty 250

## 2020-03-17 MED ORDER — HEPARIN SOD (PORK) LOCK FLUSH 100 UNIT/ML IV SOLN
500.0000 [IU] | Freq: Once | INTRAVENOUS | Status: AC | PRN
  Administered 2020-03-17: 500 [IU]
  Filled 2020-03-17: qty 5

## 2020-03-17 MED ORDER — SODIUM CHLORIDE 0.9% FLUSH
10.0000 mL | INTRAVENOUS | Status: DC | PRN
  Administered 2020-03-17: 10 mL
  Filled 2020-03-17: qty 10

## 2020-03-17 MED ORDER — SODIUM CHLORIDE 0.9 % IV SOLN
10.0000 mg | Freq: Once | INTRAVENOUS | Status: AC
  Administered 2020-03-17: 10 mg via INTRAVENOUS
  Filled 2020-03-17: qty 1
  Filled 2020-03-17: qty 10

## 2020-03-17 MED ORDER — PROCHLORPERAZINE MALEATE 10 MG PO TABS
10.0000 mg | ORAL_TABLET | Freq: Once | ORAL | Status: AC
  Administered 2020-03-17: 10 mg via ORAL

## 2020-03-17 MED ORDER — SODIUM CHLORIDE 0.9 % IV SOLN
0.7500 mg/kg | Freq: Once | INTRAVENOUS | Status: AC
  Administered 2020-03-17: 56 mg via INTRAVENOUS
  Filled 2020-03-17: qty 5.6

## 2020-03-17 MED ORDER — PROCHLORPERAZINE MALEATE 10 MG PO TABS
ORAL_TABLET | ORAL | Status: AC
  Filled 2020-03-17: qty 1

## 2020-03-17 NOTE — Patient Instructions (Signed)

## 2020-03-17 NOTE — Patient Instructions (Signed)
[place] Discharge Instructions for Patients Receiving Chemotherapy  Today you received the following chemotherapy agent: Enfortumab-vedotin-ejfv (Padcev)  To help prevent nausea and vomiting after your treatment, we encourage you to take your nausea medication as directed by your MD.   If you develop nausea and vomiting that is not controlled by your nausea medication, call the clinic.   BELOW ARE SYMPTOMS THAT SHOULD BE REPORTED IMMEDIATELY:  *FEVER GREATER THAN 100.5 F  *CHILLS WITH OR WITHOUT FEVER  NAUSEA AND VOMITING THAT IS NOT CONTROLLED WITH YOUR NAUSEA MEDICATION  *UNUSUAL SHORTNESS OF BREATH  *UNUSUAL BRUISING OR BLEEDING  TENDERNESS IN MOUTH AND THROAT WITH OR WITHOUT PRESENCE OF ULCERS  *URINARY PROBLEMS  *BOWEL PROBLEMS  UNUSUAL RASH Items with * indicate a potential emergency and should be followed up as soon as possible.  Feel free to call the clinic should you have any questions or concerns. The clinic phone number is [phone].  Please show the [facility] at check-in to the Emergency Department and triage nurse.   Diabetes Mellitus and Nutrition, Adult When you have diabetes (diabetes mellitus), it is very important to have healthy eating habits because your blood sugar (glucose) levels are greatly affected by what you eat and drink. Eating healthy foods in the appropriate amounts, at about the same times every day, can help you:  Control your blood glucose.  Lower your risk of heart disease.  Improve your blood pressure.  Reach or maintain a healthy weight. Every person with diabetes is different, and each person has different needs for a meal plan. Your health care provider may recommend that you work with a diet and nutrition specialist (dietitian) to make a meal plan that is best for you. Your meal plan may vary depending on factors such as:  The calories you need.  The medicines you take.  Your weight.  Your blood  glucose, blood pressure, and cholesterol levels.  Your activity level.  Other health conditions you have, such as heart or kidney disease. How do carbohydrates affect me? Carbohydrates, also called carbs, affect your blood glucose level more than any other type of food. Eating carbs naturally raises the amount of glucose in your blood. Carb counting is a method for keeping track of how many carbs you eat. Counting carbs is important to keep your blood glucose at a healthy level, especially if you use insulin or take certain oral diabetes medicines. It is important to know how many carbs you can safely have in each meal. This is different for every person. Your dietitian can help you calculate how many carbs you should have at each meal and for each snack. Foods that contain carbs include:  Bread, cereal, rice, pasta, and crackers.  Potatoes and corn.  Peas, beans, and lentils.  Milk and yogurt.  Fruit and juice.  Desserts, such as cakes, cookies, ice cream, and candy. How does alcohol affect me? Alcohol can cause a sudden decrease in blood glucose (hypoglycemia), especially if you use insulin or take certain oral diabetes medicines. Hypoglycemia can be a life-threatening condition. Symptoms of hypoglycemia (sleepiness, dizziness, and confusion) are similar to symptoms of having too much alcohol. If your health care provider says that alcohol is safe for you, follow these guidelines:  Limit alcohol intake to no more than 1 drink per day for nonpregnant women and 2 drinks per day for men. One drink equals 12 oz of beer, 5 oz of wine, or 1 oz of hard liquor.  Do not drink on an empty stomach.  Keep yourself hydrated with water, diet soda, or unsweetened iced tea.  Keep in mind that regular soda, juice, and other mixers may contain a lot of sugar and must be counted as carbs. What are tips for following this plan?  Reading food labels  Start by checking the serving size on the  "Nutrition Facts" label of packaged foods and drinks. The amount of calories, carbs, fats, and other nutrients listed on the label is based on one serving of the item. Many items contain more than one serving per package.  Check the total grams (g) of carbs in one serving. You can calculate the number of servings of carbs in one serving by dividing the total carbs by 15. For example, if a food has 30 g of total carbs, it would be equal to 2 servings of carbs.  Check the number of grams (g) of saturated and trans fats in one serving. Choose foods that have low or no amount of these fats.  Check the number of milligrams (mg) of salt (sodium) in one serving. Most people should limit total sodium intake to less than 2,300 mg per day.  Always check the nutrition information of foods labeled as "low-fat" or "nonfat". These foods may be higher in added sugar or refined carbs and should be avoided.  Talk to your dietitian to identify your daily goals for nutrients listed on the label. Shopping  Avoid buying canned, premade, or processed foods. These foods tend to be high in fat, sodium, and added sugar.  Shop around the outside edge of the grocery store. This includes fresh fruits and vegetables, bulk grains, fresh meats, and fresh dairy. Cooking  Use low-heat cooking methods, such as baking, instead of high-heat cooking methods like deep frying.  Cook using healthy oils, such as olive, canola, or sunflower oil.  Avoid cooking with butter, cream, or high-fat meats. Meal planning  Eat meals and snacks regularly, preferably at the same times every day. Avoid going long periods of time without eating.  Eat foods high in fiber, such as fresh fruits, vegetables, beans, and whole grains. Talk to your dietitian about how many servings of carbs you can eat at each meal.  Eat 4-6 ounces (oz) of lean protein each day, such as lean meat, chicken, fish, eggs, or tofu. One oz of lean protein is equal  to: ? 1 oz of meat, chicken, or fish. ? 1 egg. ?  cup of tofu.  Eat some foods each day that contain healthy fats, such as avocado, nuts, seeds, and fish. Lifestyle  Check your blood glucose regularly.  Exercise regularly as told by your health care provider. This may include: ? 150 minutes of moderate-intensity or vigorous-intensity exercise each week. This could be brisk walking, biking, or water aerobics. ? Stretching and doing strength exercises, such as yoga or weightlifting, at least 2 times a week.  Take medicines as told by your health care provider.  Do not use any products that contain nicotine or tobacco, such as cigarettes and e-cigarettes. If you need help quitting, ask your health care provider.  Work with a Social worker or diabetes educator to identify strategies to manage stress and any emotional and social challenges. Questions to ask a health care provider  Do I need to meet with a diabetes educator?  Do I need to meet with a dietitian?  What number can I call if I have questions?  When are the best times to  check my blood glucose? Where to find more information:  American Diabetes Association: diabetes.org  Academy of Nutrition and Dietetics: www.eatright.[employer] of Diabetes and Digestive and Kidney Diseases (NIH): [employer] Summary  A healthy meal plan will help you control your blood glucose and maintain a healthy lifestyle.  Working with a diet and nutrition specialist (dietitian) can help you make a meal plan that is best for you.  Keep in mind that carbohydrates (carbs) and alcohol have immediate effects on your blood glucose levels. It is important to count carbs and to use alcohol carefully. This information is not intended to replace advice given to you by your health care provider. Make sure you discuss any questions you have with your health care provider. Document Revised: 08/01/2017 Document Reviewed: 09/23/2016 Elsevier  Patient Education  [place].   Protein Content in Foods  Generally, most healthy people need around 50 grams of protein each day. Depending on your overall health, you may need more or less protein in your diet. Talk to your health care provider or dietitian about how much protein you need. See the following list for the protein content of some common foods. High-protein foods High-protein foods contain 4 grams (4 g) or more of protein per serving. They include:  Beef, ground sirloin (cooked) -- 3 oz have 24 g of protein.  Cheese (hard) -- 1 oz has 7 g of protein.  Chicken breast, boneless and skinless (cooked) -- 3 oz have 13.4 g of protein.  Cottage cheese -- 1/2 cup has 13.4 g of protein.  Egg -- 1 egg has 6 g of protein.  Fish, filet (cooked) -- 1 oz has 6-7 g of protein.  Garbanzo beans (canned or cooked) -- 1/2 cup has 6-7 g of protein.  Kidney beans (canned or cooked) -- 1/2 cup has 6-7 g of protein.  Lamb (cooked) -- 3 oz has 24 g of protein.  Milk -- 1 cup (8 oz) has 8 g of protein.  Nuts (peanuts, pistachios, almonds) -- 1 oz has 6 g of protein.  Peanut butter -- 1 oz has 7-8 g of protein.  Pork tenderloin (cooked) -- 3 oz has 18.4 g of protein.  Pumpkin seeds -- 1 oz has 8.5 g of protein.  Soybeans (roasted) -- 1 oz has 8 g of protein.  Soybeans (cooked) -- 1/2 cup has 11 g of protein.  Soy milk -- 1 cup (8 oz) has 5-10 g of protein.  Soy or vegetable patty -- 1 patty has 11 g of protein.  Sunflower seeds -- 1 oz has 5.5 g of protein.  Tofu (firm) -- 1/2 cup has 20 g of protein.  Tuna (canned in water) -- 3 oz has 20 g of protein.  Yogurt -- 6 oz has 8 g of protein. Low-protein foods Low-protein foods contain 3 grams (3 g) or less of protein per serving. They include:  Beets (raw or cooked) -- 1/2 cup has 1.5 g of protein.  Bran cereal -- 1/2 cup has 2-3 g of protein.  Bread -- 1 slice has 2.5 g of protein.  Broccoli (raw or cooked)  -- 1/2 cup has 2 g of protein.  Collard greens (raw or cooked) -- 1/2 cup has 2 g of protein.  Corn (fresh or cooked) -- 1/2 cup has 2 g of protein.  Cream cheese -- 1 oz has 2 g of protein.  Creamer (half-and-half) -- 1 oz has 1 g of protein.  Flour tortilla --  1 tortilla has 2.5 g of protein  Frozen yogurt -- 1/2 cup has 3 g of protein.  Fruit or vegetable juice -- 1/2 cup has 1 g of protein.  Green beans (raw or cooked) -- 1/2 cup has 1 g of protein.  Green peas (canned) -- 1/2 cup has 3.5 g of protein.  Muffins -- 1 small muffin (2 oz) has 3 g of protein.  Oatmeal (cooked) -- 1/2 cup has 3 g of protein.  Potato (baked with skin) -- 1 medium potato has 3 g of protein.  Rice (cooked) -- 1/2 cup has 2.5-3.5 g of protein.  Sour cream -- 1/2 cup has 2.5 g of protein.  Spinach (cooked) -- 1/2 cup has 3 g of protein.  Squash (cooked) -- 1/2 cup has 1.5 g of protein. Actual amounts of protein may be different depending on processing. Talk with your health care provider or dietitian about what foods are recommended for you. This information is not intended to replace advice given to you by your health care provider. Make sure you discuss any questions you have with your health care provider. Document Revised: 04/29/2016 Document Reviewed: 04/29/2016 Elsevier Patient Education  2020 [employer].

## 2020-03-17 NOTE — Progress Notes (Signed)
Patient seen and examined in the infusion room.  He is continuing to improve slowly and is able to eat better.  He still quite debilitated however.  Although he is able to eat and drink better still feels at times dehydrates.  His oral thrush is improved at this time.  Laboratory data reviewed and showed a normal CBC with improvement in his BUN and creatinine as well as blood sugar.  The plan is to continue with Padcev with dose adjustment with a reduction by 25%.  He will also receive it day 1 and day 15 with day 8 being eliminated at this time.  Instead, he will receive IV fluids on day 8.  He will receive 500 cc of normal saline today as well to assist with his hydration.

## 2020-03-23 ENCOUNTER — Other Ambulatory Visit: Payer: Self-pay | Admitting: Oncology

## 2020-03-23 DIAGNOSIS — C679 Malignant neoplasm of bladder, unspecified: Secondary | ICD-10-CM

## 2020-03-24 ENCOUNTER — Other Ambulatory Visit: Payer: Self-pay | Admitting: Oncology

## 2020-03-24 ENCOUNTER — Other Ambulatory Visit: Payer: Self-pay

## 2020-03-24 ENCOUNTER — Inpatient Hospital Stay: Payer: Medicare Other

## 2020-03-24 VITALS — BP 112/65 | HR 78 | Temp 97.7°F | Resp 18

## 2020-03-24 DIAGNOSIS — C679 Malignant neoplasm of bladder, unspecified: Secondary | ICD-10-CM

## 2020-03-24 DIAGNOSIS — Z5112 Encounter for antineoplastic immunotherapy: Secondary | ICD-10-CM | POA: Diagnosis not present

## 2020-03-24 DIAGNOSIS — Z95828 Presence of other vascular implants and grafts: Secondary | ICD-10-CM

## 2020-03-24 LAB — CBC WITH DIFFERENTIAL (CANCER CENTER ONLY)
Abs Immature Granulocytes: 0.02 10*3/uL (ref 0.00–0.07)
Basophils Absolute: 0 10*3/uL (ref 0.0–0.1)
Basophils Relative: 1 %
Eosinophils Absolute: 0 10*3/uL (ref 0.0–0.5)
Eosinophils Relative: 0 %
HCT: 41.9 % (ref 39.0–52.0)
Hemoglobin: 14.8 g/dL (ref 13.0–17.0)
Immature Granulocytes: 0 %
Lymphocytes Relative: 29 %
Lymphs Abs: 2.1 10*3/uL (ref 0.7–4.0)
MCH: 29.5 pg (ref 26.0–34.0)
MCHC: 35.3 g/dL (ref 30.0–36.0)
MCV: 83.5 fL (ref 80.0–100.0)
Monocytes Absolute: 0.6 10*3/uL (ref 0.1–1.0)
Monocytes Relative: 9 %
Neutro Abs: 4.4 10*3/uL (ref 1.7–7.7)
Neutrophils Relative %: 61 %
Platelet Count: 214 10*3/uL (ref 150–400)
RBC: 5.02 MIL/uL (ref 4.22–5.81)
RDW: 14.6 % (ref 11.5–15.5)
WBC Count: 7.1 10*3/uL (ref 4.0–10.5)
nRBC: 0 % (ref 0.0–0.2)

## 2020-03-24 LAB — CMP (CANCER CENTER ONLY)
ALT: 49 U/L — ABNORMAL HIGH (ref 0–44)
AST: 37 U/L (ref 15–41)
Albumin: 3.1 g/dL — ABNORMAL LOW (ref 3.5–5.0)
Alkaline Phosphatase: 61 U/L (ref 38–126)
Anion gap: 8 (ref 5–15)
BUN: 18 mg/dL (ref 8–23)
CO2: 26 mmol/L (ref 22–32)
Calcium: 9.3 mg/dL (ref 8.9–10.3)
Chloride: 100 mmol/L (ref 98–111)
Creatinine: 0.96 mg/dL (ref 0.61–1.24)
GFR, Est AFR Am: >60 mL/min (ref >60)
GFR, Estimated: >60 mL/min (ref >60)
Glucose, Bld: 159 mg/dL — ABNORMAL HIGH (ref 70–99)
Potassium: 4.1 mmol/L (ref 3.5–5.1)
Sodium: 134 mmol/L — ABNORMAL LOW (ref 135–145)
Total Bilirubin: 0.6 mg/dL (ref 0.3–1.2)
Total Protein: 6.2 g/dL — ABNORMAL LOW (ref 6.5–8.1)

## 2020-03-24 MED ORDER — SODIUM CHLORIDE 0.9% FLUSH
10.0000 mL | Freq: Once | INTRAVENOUS | Status: AC
  Administered 2020-03-24: 10 mL
  Filled 2020-03-24: qty 10

## 2020-03-24 MED ORDER — HEPARIN SOD (PORK) LOCK FLUSH 100 UNIT/ML IV SOLN
500.0000 [IU] | Freq: Once | INTRAVENOUS | Status: AC | PRN
  Administered 2020-03-24: 500 [IU]
  Filled 2020-03-24: qty 5

## 2020-03-24 MED ORDER — SODIUM CHLORIDE 0.9 % IV SOLN
Freq: Once | INTRAVENOUS | Status: AC
  Filled 2020-03-24: qty 250

## 2020-03-24 MED ORDER — SODIUM CHLORIDE 0.9% FLUSH
10.0000 mL | Freq: Once | INTRAVENOUS | Status: AC | PRN
  Administered 2020-03-24: 10 mL
  Filled 2020-03-24: qty 10

## 2020-03-24 NOTE — Patient Instructions (Signed)

## 2020-03-24 NOTE — Patient Instructions (Signed)

## 2020-03-28 ENCOUNTER — Other Ambulatory Visit: Payer: Self-pay

## 2020-03-28 ENCOUNTER — Inpatient Hospital Stay: Payer: Medicare Other

## 2020-03-28 ENCOUNTER — Inpatient Hospital Stay (HOSPITAL_BASED_OUTPATIENT_CLINIC_OR_DEPARTMENT_OTHER): Payer: Medicare Other | Admitting: Oncology

## 2020-03-28 VITALS — BP 110/68 | HR 80 | Temp 97.7°F | Resp 18 | Wt 153.2 lb

## 2020-03-28 DIAGNOSIS — C679 Malignant neoplasm of bladder, unspecified: Secondary | ICD-10-CM

## 2020-03-28 DIAGNOSIS — Z95828 Presence of other vascular implants and grafts: Secondary | ICD-10-CM | POA: Diagnosis present

## 2020-03-28 DIAGNOSIS — Z5112 Encounter for antineoplastic immunotherapy: Secondary | ICD-10-CM | POA: Diagnosis not present

## 2020-03-28 LAB — CMP (CANCER CENTER ONLY)
ALT: 51 U/L — ABNORMAL HIGH (ref 0–44)
AST: 36 U/L (ref 15–41)
Albumin: 3.2 g/dL — ABNORMAL LOW (ref 3.5–5.0)
Alkaline Phosphatase: 64 U/L (ref 38–126)
Anion gap: 8 (ref 5–15)
BUN: 13 mg/dL (ref 8–23)
CO2: 27 mmol/L (ref 22–32)
Calcium: 9.1 mg/dL (ref 8.9–10.3)
Chloride: 101 mmol/L (ref 98–111)
Creatinine: 0.95 mg/dL (ref 0.61–1.24)
GFR, Est AFR Am: >60 mL/min (ref >60)
GFR, Estimated: >60 mL/min (ref >60)
Glucose, Bld: 168 mg/dL — ABNORMAL HIGH (ref 70–99)
Potassium: 4 mmol/L (ref 3.5–5.1)
Sodium: 136 mmol/L (ref 135–145)
Total Bilirubin: 0.5 mg/dL (ref 0.3–1.2)
Total Protein: 6 g/dL — ABNORMAL LOW (ref 6.5–8.1)

## 2020-03-28 LAB — CBC WITH DIFFERENTIAL (CANCER CENTER ONLY)
Abs Immature Granulocytes: 0.03 10*3/uL (ref 0.00–0.07)
Basophils Absolute: 0 10*3/uL (ref 0.0–0.1)
Basophils Relative: 1 %
Eosinophils Absolute: 0 10*3/uL (ref 0.0–0.5)
Eosinophils Relative: 0 %
HCT: 41 % (ref 39.0–52.0)
Hemoglobin: 14.5 g/dL (ref 13.0–17.0)
Immature Granulocytes: 1 %
Lymphocytes Relative: 32 %
Lymphs Abs: 2 10*3/uL (ref 0.7–4.0)
MCH: 29.5 pg (ref 26.0–34.0)
MCHC: 35.4 g/dL (ref 30.0–36.0)
MCV: 83.3 fL (ref 80.0–100.0)
Monocytes Absolute: 0.6 10*3/uL (ref 0.1–1.0)
Monocytes Relative: 9 %
Neutro Abs: 3.6 10*3/uL (ref 1.7–7.7)
Neutrophils Relative %: 57 %
Platelet Count: 198 10*3/uL (ref 150–400)
RBC: 4.92 MIL/uL (ref 4.22–5.81)
RDW: 14.7 % (ref 11.5–15.5)
WBC Count: 6.2 10*3/uL (ref 4.0–10.5)
nRBC: 0 % (ref 0.0–0.2)

## 2020-03-28 MED ORDER — FENTANYL 25 MCG/HR TD PT72: 1.0000 | MEDICATED_PATCH | TRANSDERMAL | 0 refills | Status: DC

## 2020-03-28 MED ORDER — HEPARIN SOD (PORK) LOCK FLUSH 100 UNIT/ML IV SOLN
500.0000 [IU] | Freq: Once | INTRAVENOUS | Status: AC
  Administered 2020-03-28: 500 [IU]
  Filled 2020-03-28: qty 5

## 2020-03-28 NOTE — Progress Notes (Signed)
Hematology and Oncology Follow Up Visit  Danny Wood [id number] [birth year] 69 y.o. 03/28/2020 3:34 PM Danny Wood, Danny Brow, MD   Principle Diagnosis: 69 year old man with stage IV bladder cancer documented in 2019.  He was initially diagnosed  in 2015 with localized disease.  Prior Therapy:  He underwent a cystoscopy and a TURBT on 05/17/2014.   Neoadjuvant systemic chemotherapy in the form of gemcitabine and cisplatin cycle 1 day 1 is on 06/17/2014. He is S/P two cycles completed in 07/2014.  Therapy tolerated poorly with symptoms of nausea and vomiting and worsening renal function.  He is status post robotic cystoprostatectomy and bilateral lymphadenectomy completed on September 09, 2014.  The final pathology revealed T2N0 without any lymph node involvement.  He developed pelvic adenopathy that was biopsy-proven on Jan 14, 2018 to be metastatic urothelial carcinoma.  Carboplatin and gemcitabine cycle 1 started on 02/17/2018.  He completed 8 cycles of therapy in December 2019.  Pembrolizumab 200 mg every 3 weeks started on April 01, 2019.  He is status post 7 cycles of therapy in December 2020.  Therapy stopped because of of patient preference, excellent clinical response and treatment holiday.  He developed progression of disease in June 2021.   Current therapy: Padcev started on February 11, 2020.  He is currently receiving cycle 2 of therapy with dose reduction at 0.75 mg/kg every other week.  Interim History: Danny Wood presents today for a repeat evaluation.  Since the last visit, he reports no major changes in his health.  He continues to report complaints of overall weakness and unsteadiness with occasional falls.  He denies any syncope or lightheadedness.  He is eating better and has gained 2 more pounds.  He is using a walker at home.  His performance status and quality of life remains marginal but not declining at this  time.                 Medications: Reviewed without changes. Current Outpatient Medications  Medication Sig Dispense Refill  . diphenoxylate-atropine (LOMOTIL) 2.5-0.025 MG tablet 1 to 2 PO QID prn diarrhea (Patient taking differently: Take 1-2 tablets by mouth 4 (four) times daily as needed for diarrhea or loose stools. ) 45 tablet 2  . fentaNYL (DURAGESIC) 25 MCG/HR Place 1 patch onto the skin every 3 (three) days. 10 patch 0  . fluconazole (DIFLUCAN) 100 MG tablet Take 1 tablet (100 mg total) by mouth daily. 5 tablet 0  . Insulin Glargine (BASAGLAR KWIKPEN) 100 UNIT/ML SOPN Inject 16 Units into the skin every morning. **Replaces Lantus.** (Patient not taking: Reported on 01/28/2020) 15 mL 0  . insulin lispro (HUMALOG KWIKPEN) 100 UNIT/ML KwikPen Inject 0.02 mLs (2 Units total) into the skin 2 (two) times daily with a meal. (Patient not taking: Reported on 01/28/2020) 1.2 mL 2  . morphine (MS CONTIN) 30 MG 12 hr tablet Take 1 tablet (30 mg total) by mouth every 12 (twelve) hours. (Patient not taking: Reported on 03/03/2020) 60 tablet 0  . prochlorperazine (COMPAZINE) 10 MG tablet TAKE 1 TABLET(10 MG) BY MOUTH EVERY 6 HOURS AS NEEDED FOR NAUSEA OR VOMITING 30 tablet 0  . sertraline (ZOLOFT) 25 MG tablet Take 1 tablet (25 mg total) by mouth daily. 30 tablet 1   No current facility-administered medications for this visit.     Allergies: No Known Allergies    Physical Exam:      Blood pressure 110/68, pulse 80, temperature 97.7 F (36.5 C), temperature source Temporal, resp.  rate 18, weight 153 lb 3.2 oz (69.5 kg), SpO2 100 %.      ECOG: 1    General appearance: Comfortable appearing without any discomfort Head: Normocephalic without any trauma Oropharynx: Mucous membranes are moist and pink without any thrush or ulcers. Eyes: Pupils are equal and round reactive to light. Lymph nodes: No cervical, supraclavicular, inguinal or axillary lymphadenopathy.    Heart:regular rate and rhythm.  S1 and S2 without leg edema. Lung: Clear without any rhonchi or wheezes.  No dullness to percussion. Abdomin: Soft, nontender, nondistended with good bowel sounds.  No hepatosplenomegaly. Musculoskeletal: No joint deformity or effusion.  Full range of motion noted. Neurological: No deficits noted on motor, sensory and deep tendon reflex exam. Skin: No petechial rash or dryness.  Appeared moist.           .   Lab Results: Lab Results  Component Value Date   WBC 7.1 03/24/2020   HGB 14.8 03/24/2020   HCT 41.9 03/24/2020   MCV 83.5 03/24/2020   PLT 214 03/24/2020     Chemistry       Impression and Plan:  69 year old man with:    1.  Stage IV high-grade urothelial carcinoma of the bladder confirmed in 2019.   He is currently receiving Padcev the site of salvage therapy after disease progression noted in June 2021.  He tolerated the first cycle poorly and required hospitalization because of nausea and dehydration.  He received day 1 of cycle 2 with dose reduction as well as intermittent hydration and therapy has been better tolerated.  Risks and benefits of continuing this treatment were discussed.  He is agreeable to continue at the current Padcev regimen.  We will repeat imaging studies after cycle 3 of therapy.  2.  IV access: Port-A-Cath currently in use without any other complications.  3.  Diarrhea, nausea and vomiting and dehydration: Improved at this time  4.  Pain: He is currently on fentanyl patch which he has been tolerating well.  No pain reported at this time.   5.  Prognosis: His disease is incurable and this was discussed today with the patient and his wife.  Treatment is palliative    6. Followup: He will return on Friday, July 30 for day 15 of cycle 2 and in 2 weeks for the start of cycle 3.  30  minutes were dedicated to this visit.  Time was spent on reviewing his disease status, reviewing imaging studies,  discussing treatment options and future plan of care review.    Danny Button, MD 7/27/20213:34 PM

## 2020-03-30 ENCOUNTER — Ambulatory Visit (INDEPENDENT_AMBULATORY_CARE_PROVIDER_SITE_OTHER): Payer: Medicare Other | Admitting: Endocrinology

## 2020-03-30 ENCOUNTER — Telehealth: Payer: Self-pay | Admitting: Oncology

## 2020-03-30 ENCOUNTER — Encounter: Payer: Self-pay | Admitting: Endocrinology

## 2020-03-30 ENCOUNTER — Other Ambulatory Visit: Payer: Self-pay

## 2020-03-30 DIAGNOSIS — N1831 Chronic kidney disease, stage 3a: Secondary | ICD-10-CM | POA: Diagnosis not present

## 2020-03-30 DIAGNOSIS — E1121 Type 2 diabetes mellitus with diabetic nephropathy: Secondary | ICD-10-CM

## 2020-03-30 DIAGNOSIS — Z794 Long term (current) use of insulin: Secondary | ICD-10-CM | POA: Diagnosis not present

## 2020-03-30 DIAGNOSIS — E1122 Type 2 diabetes mellitus with diabetic chronic kidney disease: Secondary | ICD-10-CM

## 2020-03-30 MED ORDER — INSULIN LISPRO (1 UNIT DIAL) 100 UNIT/ML (KWIKPEN): 2.0000 [IU] | PEN_INJECTOR | Freq: Every day | SUBCUTANEOUS | 2 refills | Status: DC

## 2020-03-30 MED ORDER — BASAGLAR KWIKPEN 100 UNIT/ML ~~LOC~~ SOPN: 8.0000 [IU] | PEN_INJECTOR | SUBCUTANEOUS | 11 refills | Status: DC

## 2020-03-30 NOTE — Patient Instructions (Addendum)
You do not need to be on a limited diet.  You should eat more--whatever you want.   check your blood sugar twice a day.  vary the time of day when you check, between before the 3 meals, and at bedtime.  also check if you have symptoms of your blood sugar being too high or too low.  please keep a record of the readings and bring it to your next appointment here (or you can bring the meter itself).  You can write it on any piece of paper.  please call us sooner if your blood sugar goes below 70, or if you have a lot of readings over 200.  Please continue the same insulins for now.   Please come back for a follow-up appointment in 1 month.

## 2020-03-30 NOTE — Progress Notes (Signed)
Subjective:    Patient ID: Danny Wood, male    DOB: 1950/12/28, 69 y.o.   MRN: [id number]  HPI  Pt returns for f/u of diabetes mellitus: DM type: Insulin-requiring type 2 DM. Dx'ed: 5638 Complications: CRI Therapy: insulin since dx DKA: never Severe hypoglycemia: never Pancreatitis: never Pancreatic imaging: normal on 2021 CT SDOH: none Other: pt has stage 4 Bladder Ca Interval history: Basaglar was reduced to 8/d, last week, due to hypoglycemia.   He also takes Humalog 2 units with supper.  He brings his meter with his cbg's which I have reviewed today.  cbg varies from 104-206.  It is in general higher as the day goes on.   Past Medical History:  Diagnosis Date  . At risk for sleep apnea    STOP-BANG= 4       SENT TO PCP 05-16-2014  . bladder ca dx'd 05/17/14   met dz 12/2017  . Bladder disorder   . Hematuria   . History of renal cell carcinoma    2003  S/P  PARTIAL RIGHT NEPHRECTOMY  . Presence of surgical incision    lumbar diskectomy 05-11-2014-  bandage present  . Urgency of urination     Past Surgical History:  Procedure Laterality Date  . CYSTOSCOPY N/A 09/09/2014   Procedure: CYSTOSCOPY WITH INDOCYANINE GREEN DYE INJECTION AND URETHRAL DILATION;  Surgeon: Danny Frock, MD;  Location: [facility];  Service: Urology;  Laterality: N/A;  . CYSTOSCOPY WITH BIOPSY N/A 05/17/2014   Procedure: CYSTO WITH BLADDER BIOPSY;  Surgeon: Danny So, MD;  Location[facility];  Service: Urology;  Laterality: N/A;  . EVALUATION UNDER ANESTHESIA WITH HEMORRHOIDECTOMY  07/07/2012   Procedure: EXAM UNDER ANESTHESIA WITH HEMORRHOIDECTOMY;  Surgeon: Danny Faster. Cornett, MD;  Location: [facility];  Service: General;  Laterality: N/A;  . IR IMAGING GUIDED PORT INSERTION  02/20/2018  . LUMBAR MICRODISCECTOMY  05-11-2014   L4 -- L5 (partial)  . PARTIAL NEPHRECTOMY Right 2003  . ROBOT ASSISTED LAPAROSCOPIC COMPLETE CYSTECT ILEAL CONDUIT N/A 09/09/2014   Procedure: ROBOTIC  ASSISTED LAPAROSCOPIC COMPLETE CYSTOPROSTATECTOMY,  ILEAL CONDUIT;  Surgeon: Danny Frock, MD;  Location: [facility];  Service: Urology;  Laterality: N/A;    Social History   Socioeconomic History  . Marital status: Married    Spouse name: Not on file  . Number of children: Not on file  . Years of education: Not on file  . Highest education level: Not on file  Occupational History  . Not on file  Tobacco Use  . Smoking status: Former Smoker    Packs/day: 0.50    Years: 25.00    Pack years: 12.50    Types: Cigarettes    Quit date: 06/02/2004    Years since quitting: 15.8  . Smokeless tobacco: Never Used  Substance and Sexual Activity  . Alcohol use: Yes    Alcohol/week: 2.0 standard drinks    Types: 2 Standard drinks or equivalent per week  . Drug use: No  . Sexual activity: Not on file  Other Topics Concern  . Not on file  Social History Narrative  . Not on file   Social Determinants of Health   Financial Resource Strain:   . Difficulty of Paying Living Expenses:   Food Insecurity:   . Worried About Charity fundraiser in the Last Year:   . Arboriculturist in the Last Year:   Transportation Needs:   . Film/video editor (Medical):   Marland Kitchen  Lack of Transportation (Non-Medical):   Physical Activity:   . Days of Exercise per Week:   . Minutes of Exercise per Session:   Stress:   . Feeling of Stress :   Social Connections:   . Frequency of Communication with Friends and Family:   . Frequency of Social Gatherings with Friends and Family:   . Attends Religious Services:   . Active Member of Clubs or Organizations:   . Attends Archivist Meetings:   Marland Kitchen Marital Status:   Intimate Partner Violence:   . Fear of Current or Ex-Partner:   . Emotionally Abused:   Marland Kitchen Physically Abused:   . Sexually Abused:     Current Outpatient Medications on File Prior to Visit  Medication Sig Dispense Refill  . diphenoxylate-atropine (LOMOTIL) 2.5-0.025 MG tablet 1 to 2 PO QID  prn diarrhea (Patient taking differently: Take 1-2 tablets by mouth 4 (four) times daily as needed for diarrhea or loose stools. ) 45 tablet 2  . fentaNYL (DURAGESIC) 25 MCG/HR Place 1 patch onto the skin every 3 (three) days. 10 patch 0  . prochlorperazine (COMPAZINE) 10 MG tablet TAKE 1 TABLET(10 MG) BY MOUTH EVERY 6 HOURS AS NEEDED FOR NAUSEA OR VOMITING 30 tablet 0  . sertraline (ZOLOFT) 25 MG tablet Take 1 tablet (25 mg total) by mouth daily. 30 tablet 1   No current facility-administered medications on file prior to visit.    No Known Allergies  Family History  Problem Relation Age of Onset  . Diabetes Neg Hx     BP (!) 98/60   Pulse 88   Ht _0  (1.88 m)   Wt 155 lb (70.3 kg)   SpO2 99%   BMI 19.90 kg/m    Review of Systems He has lost 51 lbs since last ov.  weight has leveled off.  Denies n/v.      Objective:   Physical Exam VITAL SIGNS:  See vs page GENERAL: no distress Pulses: dorsalis pedis intact bilat.   MSK: no deformity of the feet CV: no leg edema Skin:  no ulcer on the feet.  normal color and temp on the feet. Neuro: sensation is intact to touch on the feet.    Lab Results  Component Value Date   HGBA1C 9.1 (H) 03/03/2020       Assessment & Plan:  Insulin-requiring type 2 DM Weight loss, new to me, due to malignancy. We discussed goals of rx.  We'll shoot for cbg's in the 100's.   Patient Instructions  You do not need to be on a limited diet.  You should eat more--whatever you want.   check your blood sugar twice a day.  vary the time of day when you check, between before the 3 meals, and at bedtime.  also check if you have symptoms of your blood sugar being too high or too low.  please keep a record of the readings and bring it to your next appointment here (or you can bring the meter itself).  You can write it on any piece of paper.  please call us sooner if your blood sugar goes below 70, or if you have a lot of readings over 200.  Please  continue the same insulins for now.   Please come back for a follow-up appointment in 1 month.

## 2020-03-30 NOTE — Telephone Encounter (Signed)
Scheduled per 07/27 los, patient has been called and notified. 

## 2020-03-31 ENCOUNTER — Inpatient Hospital Stay: Payer: Medicare Other

## 2020-03-31 ENCOUNTER — Other Ambulatory Visit: Payer: Self-pay

## 2020-03-31 VITALS — BP 98/68 | HR 81 | Temp 97.7°F | Resp 16

## 2020-03-31 DIAGNOSIS — C679 Malignant neoplasm of bladder, unspecified: Secondary | ICD-10-CM

## 2020-03-31 DIAGNOSIS — Z5112 Encounter for antineoplastic immunotherapy: Secondary | ICD-10-CM | POA: Diagnosis not present

## 2020-03-31 LAB — CBC WITH DIFFERENTIAL (CANCER CENTER ONLY)
Abs Immature Granulocytes: 0.02 10*3/uL (ref 0.00–0.07)
Basophils Absolute: 0 10*3/uL (ref 0.0–0.1)
Basophils Relative: 1 %
Eosinophils Absolute: 0 10*3/uL (ref 0.0–0.5)
Eosinophils Relative: 1 %
HCT: 38.9 % — ABNORMAL LOW (ref 39.0–52.0)
Hemoglobin: 13.7 g/dL (ref 13.0–17.0)
Immature Granulocytes: 0 %
Lymphocytes Relative: 24 %
Lymphs Abs: 1.6 10*3/uL (ref 0.7–4.0)
MCH: 29.4 pg (ref 26.0–34.0)
MCHC: 35.2 g/dL (ref 30.0–36.0)
MCV: 83.5 fL (ref 80.0–100.0)
Monocytes Absolute: 0.5 10*3/uL (ref 0.1–1.0)
Monocytes Relative: 8 %
Neutro Abs: 4.3 10*3/uL (ref 1.7–7.7)
Neutrophils Relative %: 66 %
Platelet Count: 197 10*3/uL (ref 150–400)
RBC: 4.66 MIL/uL (ref 4.22–5.81)
RDW: 14.8 % (ref 11.5–15.5)
WBC Count: 6.5 10*3/uL (ref 4.0–10.5)
nRBC: 0 % (ref 0.0–0.2)

## 2020-03-31 LAB — CMP (CANCER CENTER ONLY)
ALT: 43 U/L (ref 0–44)
AST: 28 U/L (ref 15–41)
Albumin: 3 g/dL — ABNORMAL LOW (ref 3.5–5.0)
Alkaline Phosphatase: 71 U/L (ref 38–126)
Anion gap: 7 (ref 5–15)
BUN: 15 mg/dL (ref 8–23)
CO2: 25 mmol/L (ref 22–32)
Calcium: 8.8 mg/dL — ABNORMAL LOW (ref 8.9–10.3)
Chloride: 102 mmol/L (ref 98–111)
Creatinine: 0.83 mg/dL (ref 0.61–1.24)
GFR, Est AFR Am: >60 mL/min (ref >60)
GFR, Estimated: >60 mL/min (ref >60)
Glucose, Bld: 166 mg/dL — ABNORMAL HIGH (ref 70–99)
Potassium: 3.8 mmol/L (ref 3.5–5.1)
Sodium: 134 mmol/L — ABNORMAL LOW (ref 135–145)
Total Bilirubin: 0.6 mg/dL (ref 0.3–1.2)
Total Protein: 5.8 g/dL — ABNORMAL LOW (ref 6.5–8.1)

## 2020-03-31 MED ORDER — PROCHLORPERAZINE MALEATE 10 MG PO TABS
ORAL_TABLET | ORAL | Status: AC
  Filled 2020-03-31: qty 1

## 2020-03-31 MED ORDER — SODIUM CHLORIDE 0.9 % IV SOLN
0.7500 mg/kg | Freq: Once | INTRAVENOUS | Status: AC
  Administered 2020-03-31: 56 mg via INTRAVENOUS
  Filled 2020-03-31: qty 5.6

## 2020-03-31 MED ORDER — SODIUM CHLORIDE 0.9 % IV SOLN
Freq: Once | INTRAVENOUS | Status: AC
  Filled 2020-03-31: qty 250

## 2020-03-31 MED ORDER — SODIUM CHLORIDE 0.9% FLUSH
10.0000 mL | INTRAVENOUS | Status: DC | PRN
  Administered 2020-03-31: 10 mL
  Filled 2020-03-31: qty 10

## 2020-03-31 MED ORDER — SODIUM CHLORIDE 0.9 % IV SOLN
10.0000 mg | Freq: Once | INTRAVENOUS | Status: AC
  Administered 2020-03-31: 10 mg via INTRAVENOUS
  Filled 2020-03-31: qty 10

## 2020-03-31 MED ORDER — PROCHLORPERAZINE MALEATE 10 MG PO TABS
10.0000 mg | ORAL_TABLET | Freq: Once | ORAL | Status: AC
  Administered 2020-03-31: 10 mg via ORAL

## 2020-03-31 MED ORDER — HEPARIN SOD (PORK) LOCK FLUSH 100 UNIT/ML IV SOLN
500.0000 [IU] | Freq: Once | INTRAVENOUS | Status: AC | PRN
  Administered 2020-03-31: 500 [IU]
  Filled 2020-03-31: qty 5

## 2020-03-31 NOTE — Patient Instructions (Signed)

## 2020-03-31 NOTE — Patient Instructions (Signed)
[facility] Discharge Instructions for Patients Receiving Chemotherapy  Today you received the following chemotherapy agents Padcev  To help prevent nausea and vomiting after your treatment, we encourage you to take your nausea medication as prescribed If you develop nausea and vomiting that is not controlled by your nausea medication, call the clinic.   BELOW ARE SYMPTOMS THAT SHOULD BE REPORTED IMMEDIATELY:  *FEVER GREATER THAN 100.5 F  *CHILLS WITH OR WITHOUT FEVER  NAUSEA AND VOMITING THAT IS NOT CONTROLLED WITH YOUR NAUSEA MEDICATION  *UNUSUAL SHORTNESS OF BREATH  *UNUSUAL BRUISING OR BLEEDING  TENDERNESS IN MOUTH AND THROAT WITH OR WITHOUT PRESENCE OF ULCERS  *URINARY PROBLEMS  *BOWEL PROBLEMS  UNUSUAL RASH Items with * indicate a potential emergency and should be followed up as soon as possible.  Feel free to call the clinic should you have any questions or concerns. The clinic phone number is [phone].  Please show the CHEMO ALERT CARD at check-in to the Emergency Department and triage nurse.   

## 2020-04-14 ENCOUNTER — Other Ambulatory Visit: Payer: Self-pay

## 2020-04-14 ENCOUNTER — Inpatient Hospital Stay: Payer: Medicare Other

## 2020-04-14 ENCOUNTER — Inpatient Hospital Stay: Payer: Medicare Other | Attending: Oncology

## 2020-04-14 VITALS — BP 112/69 | HR 80 | Temp 98.1°F | Resp 18

## 2020-04-14 DIAGNOSIS — Z5112 Encounter for antineoplastic immunotherapy: Secondary | ICD-10-CM | POA: Insufficient documentation

## 2020-04-14 DIAGNOSIS — C679 Malignant neoplasm of bladder, unspecified: Secondary | ICD-10-CM | POA: Diagnosis present

## 2020-04-14 DIAGNOSIS — C680 Malignant neoplasm of urethra: Secondary | ICD-10-CM | POA: Insufficient documentation

## 2020-04-14 DIAGNOSIS — Z95828 Presence of other vascular implants and grafts: Secondary | ICD-10-CM

## 2020-04-14 DIAGNOSIS — R079 Chest pain, unspecified: Secondary | ICD-10-CM | POA: Insufficient documentation

## 2020-04-14 DIAGNOSIS — R590 Localized enlarged lymph nodes: Secondary | ICD-10-CM | POA: Insufficient documentation

## 2020-04-14 DIAGNOSIS — E860 Dehydration: Secondary | ICD-10-CM | POA: Insufficient documentation

## 2020-04-14 DIAGNOSIS — R112 Nausea with vomiting, unspecified: Secondary | ICD-10-CM | POA: Diagnosis not present

## 2020-04-14 DIAGNOSIS — Z79899 Other long term (current) drug therapy: Secondary | ICD-10-CM | POA: Insufficient documentation

## 2020-04-14 LAB — CBC WITH DIFFERENTIAL (CANCER CENTER ONLY)
Abs Immature Granulocytes: 0.03 10*3/uL (ref 0.00–0.07)
Basophils Absolute: 0 10*3/uL (ref 0.0–0.1)
Basophils Relative: 1 %
Eosinophils Absolute: 0 10*3/uL (ref 0.0–0.5)
Eosinophils Relative: 1 %
HCT: 38.3 % — ABNORMAL LOW (ref 39.0–52.0)
Hemoglobin: 13.3 g/dL (ref 13.0–17.0)
Immature Granulocytes: 1 %
Lymphocytes Relative: 27 %
Lymphs Abs: 1.7 10*3/uL (ref 0.7–4.0)
MCH: 29.4 pg (ref 26.0–34.0)
MCHC: 34.7 g/dL (ref 30.0–36.0)
MCV: 84.7 fL (ref 80.0–100.0)
Monocytes Absolute: 0.5 10*3/uL (ref 0.1–1.0)
Monocytes Relative: 8 %
Neutro Abs: 4 10*3/uL (ref 1.7–7.7)
Neutrophils Relative %: 62 %
Platelet Count: 208 10*3/uL (ref 150–400)
RBC: 4.52 MIL/uL (ref 4.22–5.81)
RDW: 15.1 % (ref 11.5–15.5)
WBC Count: 6.3 10*3/uL (ref 4.0–10.5)
nRBC: 0 % (ref 0.0–0.2)

## 2020-04-14 LAB — CMP (CANCER CENTER ONLY)
ALT: 36 U/L (ref 0–44)
AST: 28 U/L (ref 15–41)
Albumin: 3.2 g/dL — ABNORMAL LOW (ref 3.5–5.0)
Alkaline Phosphatase: 78 U/L (ref 38–126)
Anion gap: 11 (ref 5–15)
BUN: 21 mg/dL (ref 8–23)
CO2: 23 mmol/L (ref 22–32)
Calcium: 9.2 mg/dL (ref 8.9–10.3)
Chloride: 103 mmol/L (ref 98–111)
Creatinine: 0.95 mg/dL (ref 0.61–1.24)
GFR, Est AFR Am: >60 mL/min (ref >60)
GFR, Estimated: >60 mL/min (ref >60)
Glucose, Bld: 210 mg/dL — ABNORMAL HIGH (ref 70–99)
Potassium: 4.6 mmol/L (ref 3.5–5.1)
Sodium: 137 mmol/L (ref 135–145)
Total Bilirubin: 0.6 mg/dL (ref 0.3–1.2)
Total Protein: 6.2 g/dL — ABNORMAL LOW (ref 6.5–8.1)

## 2020-04-14 MED ORDER — SODIUM CHLORIDE 0.9 % IV SOLN
Freq: Once | INTRAVENOUS | Status: AC
  Filled 2020-04-14: qty 250

## 2020-04-14 MED ORDER — SODIUM CHLORIDE 0.9 % IV SOLN
0.7500 mg/kg | Freq: Once | INTRAVENOUS | Status: AC
  Administered 2020-04-14: 56 mg via INTRAVENOUS
  Filled 2020-04-14: qty 5.6

## 2020-04-14 MED ORDER — PROCHLORPERAZINE MALEATE 10 MG PO TABS
ORAL_TABLET | ORAL | Status: AC
  Filled 2020-04-14: qty 1

## 2020-04-14 MED ORDER — HEPARIN SOD (PORK) LOCK FLUSH 100 UNIT/ML IV SOLN
500.0000 [IU] | Freq: Once | INTRAVENOUS | Status: AC | PRN
  Administered 2020-04-14: 500 [IU]
  Filled 2020-04-14: qty 5

## 2020-04-14 MED ORDER — SODIUM CHLORIDE 0.9 % IV SOLN
10.0000 mg | Freq: Once | INTRAVENOUS | Status: AC
  Administered 2020-04-14: 10 mg via INTRAVENOUS
  Filled 2020-04-14: qty 10

## 2020-04-14 MED ORDER — SODIUM CHLORIDE 0.9% FLUSH
10.0000 mL | INTRAVENOUS | Status: DC | PRN
  Administered 2020-04-14: 10 mL
  Filled 2020-04-14: qty 10

## 2020-04-14 MED ORDER — PROCHLORPERAZINE MALEATE 10 MG PO TABS
10.0000 mg | ORAL_TABLET | Freq: Once | ORAL | Status: AC
  Administered 2020-04-14: 10 mg via ORAL

## 2020-04-14 MED ORDER — SODIUM CHLORIDE 0.9% FLUSH
10.0000 mL | Freq: Once | INTRAVENOUS | Status: AC | PRN
  Administered 2020-04-14: 10 mL
  Filled 2020-04-14: qty 10

## 2020-04-14 NOTE — Patient Instructions (Signed)
[facility] Discharge Instructions for Patients Receiving Chemotherapy  Today you received the following chemotherapy agents: Padcev   To help prevent nausea and vomiting after your treatment, we encourage you to take your nausea medication as directed.    If you develop nausea and vomiting that is not controlled by your nausea medication, call the clinic.   BELOW ARE SYMPTOMS THAT SHOULD BE REPORTED IMMEDIATELY:  *FEVER GREATER THAN 100.5 F  *CHILLS WITH OR WITHOUT FEVER  NAUSEA AND VOMITING THAT IS NOT CONTROLLED WITH YOUR NAUSEA MEDICATION  *UNUSUAL SHORTNESS OF BREATH  *UNUSUAL BRUISING OR BLEEDING  TENDERNESS IN MOUTH AND THROAT WITH OR WITHOUT PRESENCE OF ULCERS  *URINARY PROBLEMS  *BOWEL PROBLEMS  UNUSUAL RASH Items with * indicate a potential emergency and should be followed up as soon as possible.  Feel free to call the clinic should you have any questions or concerns. The clinic phone number is [phone].  Please show the CHEMO ALERT CARD at check-in to the Emergency Department and triage nurse.   

## 2020-04-21 ENCOUNTER — Inpatient Hospital Stay: Payer: Medicare Other

## 2020-04-21 ENCOUNTER — Ambulatory Visit (HOSPITAL_COMMUNITY): Payer: Managed Care, Other (non HMO)

## 2020-04-21 ENCOUNTER — Inpatient Hospital Stay (HOSPITAL_BASED_OUTPATIENT_CLINIC_OR_DEPARTMENT_OTHER): Payer: Medicare Other | Admitting: Oncology

## 2020-04-21 ENCOUNTER — Other Ambulatory Visit: Payer: Self-pay

## 2020-04-21 VITALS — BP 106/46

## 2020-04-21 VITALS — BP 124/75 | HR 84 | Temp 97.8°F | Resp 18 | Ht 74.0 in | Wt 160.0 lb

## 2020-04-21 DIAGNOSIS — C679 Malignant neoplasm of bladder, unspecified: Secondary | ICD-10-CM

## 2020-04-21 DIAGNOSIS — Z5112 Encounter for antineoplastic immunotherapy: Secondary | ICD-10-CM | POA: Diagnosis not present

## 2020-04-21 DIAGNOSIS — Z95828 Presence of other vascular implants and grafts: Secondary | ICD-10-CM

## 2020-04-21 LAB — CBC WITH DIFFERENTIAL (CANCER CENTER ONLY)
Abs Immature Granulocytes: 0.04 10*3/uL (ref 0.00–0.07)
Basophils Absolute: 0 10*3/uL (ref 0.0–0.1)
Basophils Relative: 0 %
Eosinophils Absolute: 0 10*3/uL (ref 0.0–0.5)
Eosinophils Relative: 0 %
HCT: 36.8 % — ABNORMAL LOW (ref 39.0–52.0)
Hemoglobin: 13 g/dL (ref 13.0–17.0)
Immature Granulocytes: 0 %
Lymphocytes Relative: 22 %
Lymphs Abs: 2 10*3/uL (ref 0.7–4.0)
MCH: 29.9 pg (ref 26.0–34.0)
MCHC: 35.3 g/dL (ref 30.0–36.0)
MCV: 84.6 fL (ref 80.0–100.0)
Monocytes Absolute: 0.8 10*3/uL (ref 0.1–1.0)
Monocytes Relative: 8 %
Neutro Abs: 6.2 10*3/uL (ref 1.7–7.7)
Neutrophils Relative %: 70 %
Platelet Count: 196 10*3/uL (ref 150–400)
RBC: 4.35 MIL/uL (ref 4.22–5.81)
RDW: 15.3 % (ref 11.5–15.5)
WBC Count: 9.1 10*3/uL (ref 4.0–10.5)
nRBC: 0 % (ref 0.0–0.2)

## 2020-04-21 LAB — CMP (CANCER CENTER ONLY)
ALT: 35 U/L (ref 0–44)
AST: 29 U/L (ref 15–41)
Albumin: 3.3 g/dL — ABNORMAL LOW (ref 3.5–5.0)
Alkaline Phosphatase: 67 U/L (ref 38–126)
Anion gap: 9 (ref 5–15)
BUN: 25 mg/dL — ABNORMAL HIGH (ref 8–23)
CO2: 24 mmol/L (ref 22–32)
Calcium: 9.3 mg/dL (ref 8.9–10.3)
Chloride: 101 mmol/L (ref 98–111)
Creatinine: 0.9 mg/dL (ref 0.61–1.24)
GFR, Est AFR Am: >60 mL/min (ref >60)
GFR, Estimated: >60 mL/min (ref >60)
Glucose, Bld: 132 mg/dL — ABNORMAL HIGH (ref 70–99)
Potassium: 4.3 mmol/L (ref 3.5–5.1)
Sodium: 134 mmol/L — ABNORMAL LOW (ref 135–145)
Total Bilirubin: 0.8 mg/dL (ref 0.3–1.2)
Total Protein: 6.4 g/dL — ABNORMAL LOW (ref 6.5–8.1)

## 2020-04-21 MED ORDER — SODIUM CHLORIDE 0.9 % IV SOLN
Freq: Once | INTRAVENOUS | Status: AC
  Filled 2020-04-21: qty 250

## 2020-04-21 MED ORDER — OXYCODONE-ACETAMINOPHEN 10-325 MG PO TABS
1.0000 | ORAL_TABLET | ORAL | 0 refills | Status: DC | PRN
Start: 2020-04-21 — End: 2020-05-15

## 2020-04-21 MED ORDER — SODIUM CHLORIDE 0.9% FLUSH
10.0000 mL | Freq: Once | INTRAVENOUS | Status: AC
  Administered 2020-04-21: 10 mL
  Filled 2020-04-21: qty 10

## 2020-04-21 NOTE — Progress Notes (Signed)
Hematology and Oncology Follow Up Visit  Danny Wood [id number] 1951-06-03 69 y.o. 04/21/2020 11:11 AM Danny Wood, Danny Brow, MD   Principle Diagnosis: 69 year old man with bladder cancer diagnosed in 2015 with localized disease.  He developed stage IV disease with lymphadenopathy in 2019.  Prior Therapy:  He underwent a cystoscopy and a TURBT on 05/17/2014.   Neoadjuvant systemic chemotherapy in the form of gemcitabine and cisplatin cycle 1 day 1 is on 06/17/2014. He is S/P two cycles completed in 07/2014.  Therapy tolerated poorly with symptoms of nausea and vomiting and worsening renal function.  He is status post robotic cystoprostatectomy and bilateral lymphadenectomy completed on September 09, 2014.  The final pathology revealed T2N0 without any lymph node involvement.  He developed pelvic adenopathy that was biopsy-proven on Jan 14, 2018 to be metastatic urothelial carcinoma.  Carboplatin and gemcitabine cycle 1 started on 02/17/2018.  He completed 8 cycles of therapy in December 2019.  Pembrolizumab 200 mg every 3 weeks started on April 01, 2019.  He is status post 7 cycles of therapy in December 2020.  Therapy stopped because of of patient preference, excellent clinical response and treatment holiday.  He developed progression of disease in June 2021.   Current therapy: Padcev started on February 11, 2020.  He is currently receiving it at  0.75 mg/kg every other week.  He is here for evaluation prior to day 15 of cycle 3.  Interim History: [name] returns today for a follow-up visit.  Since last visit, he reports overall improvement in his health but still has few complaints. He denies any nausea, vomiting or abdominal pain. He denies any diarrhea. He is eating better and has gained 35 pounds in the last 2 weeks. He is ambulating without help of a chair or walker. He is attending to activities of daily living more than previously.  He continues to have issues with  pain in his chest wall which does require oxycodone periodically. He feels it certain days any pain edition and at times his fentanyl does not get replaced for 4 to 5 days.                 Medications: Unchanged on review. Current Outpatient Medications  Medication Sig Dispense Refill  . diphenoxylate-atropine (LOMOTIL) 2.5-0.025 MG tablet 1 to 2 PO QID prn diarrhea (Patient taking differently: Take 1-2 tablets by mouth 4 (four) times daily as needed for diarrhea or loose stools. ) 45 tablet 2  . fentaNYL (DURAGESIC) 25 MCG/HR Place 1 patch onto the skin every 3 (three) days. 10 patch 0  . Insulin Glargine (BASAGLAR KWIKPEN) 100 UNIT/ML Inject 0.08 mLs (8 Units total) into the skin every morning. 1 pen 11  . insulin lispro (HUMALOG KWIKPEN) 100 UNIT/ML KwikPen Inject 0.02 mLs (2 Units total) into the skin daily with supper. 1.2 mL 2  . prochlorperazine (COMPAZINE) 10 MG tablet TAKE 1 TABLET(10 MG) BY MOUTH EVERY 6 HOURS AS NEEDED FOR NAUSEA OR VOMITING 30 tablet 0  . sertraline (ZOLOFT) 25 MG tablet Take 1 tablet (25 mg total) by mouth daily. 30 tablet 1   No current facility-administered medications for this visit.     Allergies: No Known Allergies    Physical Exam:       Blood pressure 124/75, pulse 84, temperature 97.8 F (36.6 C), resp. rate 18, height 6\' 2"  (1.88 m), weight 160 lb (72.6 kg), SpO2 100 %.      ECOG: 1   General appearance: Alert,  awake without any distress. Head: Atraumatic without abnormalities Oropharynx: Without any thrush or ulcers. Eyes: No scleral icterus. Lymph nodes: No lymphadenopathy noted in the cervical, supraclavicular, or axillary nodes Heart:regular rate and rhythm, without any murmurs or gallops.   Lung: Clear to auscultation without any rhonchi, wheezes or dullness to percussion. Abdomin: Soft, nontender without any shifting dullness or ascites. Musculoskeletal: No clubbing or cyanosis. Neurological: No motor or sensory  deficits. Skin: No rashes or lesions.          .   Lab Results: Lab Results  Component Value Date   WBC 6.3 04/14/2020   HGB 13.3 04/14/2020   HCT 38.3 (L) 04/14/2020   MCV 84.7 04/14/2020   PLT 208 04/14/2020     Chemistry       Impression and Plan:  69 year old man with:    1.  Bladder cancer diagnosed in 2015.  He subsequently developed stage IV high-grade urothelial carcinoma without metastatic disease in 2019.   He is currently receiving Padcev with a reduced dose and schedule and ready to complete cycle 3 of therapy.  Risks and benefits of proceeding with therapy were discussed which is scheduled for August 27.  Upon completing cycle 3 he will receive staging work-up to assess his response.  He is agreeable to proceed at this time. He will receive IV hydration today only without any additional treatments.  2.  IV access: Port-A-Cath remains in use without any issues.  3.  Dehydration poor p.o. intake: Improved although he does dehydrated at times and will receive IV fluids today.  4.  Pain: We will discontinue fentanyl patch and use oxycodone breakthrough only and assess his pain in the next 2 weeks.   5.  Prognosis: any therapy is palliative at this time and his disease is incurable.  Aggressive measures are warranted given his reasonable performance status.    6. Followup: On August 27 for day 15 of cycle 3 and will return on September 10 for the start of cycle 4.  30  minutes were spent on this encounter.  The time was dedicated to reviewing his disease status, discussing treatment options and future plan of care review.    Danny Button, MD 8/20/202111:11 AM

## 2020-04-21 NOTE — Patient Instructions (Signed)

## 2020-04-21 NOTE — Patient Instructions (Signed)

## 2020-04-24 ENCOUNTER — Telehealth: Payer: Self-pay | Admitting: Oncology

## 2020-04-24 NOTE — Telephone Encounter (Signed)
Scheduled appointments per 8/20 los. Attempted to go over appointments with patient, but patient said they did not have time when I called. I put in notes for next appointment to print out updated calendar for patient. Patient also says MyChart is active and they will view appointments on there.

## 2020-04-28 ENCOUNTER — Inpatient Hospital Stay: Payer: Medicare Other

## 2020-04-28 ENCOUNTER — Other Ambulatory Visit: Payer: Self-pay

## 2020-04-28 VITALS — BP 107/73 | HR 74 | Temp 97.9°F | Resp 18

## 2020-04-28 DIAGNOSIS — Z5112 Encounter for antineoplastic immunotherapy: Secondary | ICD-10-CM | POA: Diagnosis not present

## 2020-04-28 DIAGNOSIS — C679 Malignant neoplasm of bladder, unspecified: Secondary | ICD-10-CM

## 2020-04-28 DIAGNOSIS — Z95828 Presence of other vascular implants and grafts: Secondary | ICD-10-CM

## 2020-04-28 LAB — CMP (CANCER CENTER ONLY)
ALT: 27 U/L (ref 0–44)
AST: 26 U/L (ref 15–41)
Albumin: 3.1 g/dL — ABNORMAL LOW (ref 3.5–5.0)
Alkaline Phosphatase: 71 U/L (ref 38–126)
Anion gap: 9 (ref 5–15)
BUN: 20 mg/dL (ref 8–23)
CO2: 26 mmol/L (ref 22–32)
Calcium: 9.1 mg/dL (ref 8.9–10.3)
Chloride: 103 mmol/L (ref 98–111)
Creatinine: 0.86 mg/dL (ref 0.61–1.24)
GFR, Est AFR Am: >60 mL/min (ref >60)
GFR, Estimated: >60 mL/min (ref >60)
Glucose, Bld: 195 mg/dL — ABNORMAL HIGH (ref 70–99)
Potassium: 4.1 mmol/L (ref 3.5–5.1)
Sodium: 138 mmol/L (ref 135–145)
Total Bilirubin: 0.5 mg/dL (ref 0.3–1.2)
Total Protein: 6.1 g/dL — ABNORMAL LOW (ref 6.5–8.1)

## 2020-04-28 LAB — CBC WITH DIFFERENTIAL (CANCER CENTER ONLY)
Abs Immature Granulocytes: 0.03 10*3/uL (ref 0.00–0.07)
Basophils Absolute: 0 10*3/uL (ref 0.0–0.1)
Basophils Relative: 1 %
Eosinophils Absolute: 0.1 10*3/uL (ref 0.0–0.5)
Eosinophils Relative: 1 %
HCT: 36.1 % — ABNORMAL LOW (ref 39.0–52.0)
Hemoglobin: 12.7 g/dL — ABNORMAL LOW (ref 13.0–17.0)
Immature Granulocytes: 1 %
Lymphocytes Relative: 29 %
Lymphs Abs: 1.7 10*3/uL (ref 0.7–4.0)
MCH: 30 pg (ref 26.0–34.0)
MCHC: 35.2 g/dL (ref 30.0–36.0)
MCV: 85.1 fL (ref 80.0–100.0)
Monocytes Absolute: 0.6 10*3/uL (ref 0.1–1.0)
Monocytes Relative: 9 %
Neutro Abs: 3.6 10*3/uL (ref 1.7–7.7)
Neutrophils Relative %: 59 %
Platelet Count: 217 10*3/uL (ref 150–400)
RBC: 4.24 MIL/uL (ref 4.22–5.81)
RDW: 15.1 % (ref 11.5–15.5)
WBC Count: 6 10*3/uL (ref 4.0–10.5)
nRBC: 0 % (ref 0.0–0.2)

## 2020-04-28 MED ORDER — SODIUM CHLORIDE 0.9 % IV SOLN
Freq: Once | INTRAVENOUS | Status: AC
  Filled 2020-04-28: qty 250

## 2020-04-28 MED ORDER — SODIUM CHLORIDE 0.9% FLUSH
10.0000 mL | INTRAVENOUS | Status: DC | PRN
  Administered 2020-04-28: 10 mL
  Filled 2020-04-28: qty 10

## 2020-04-28 MED ORDER — SODIUM CHLORIDE 0.9 % IV SOLN
0.7500 mg/kg | Freq: Once | INTRAVENOUS | Status: AC
  Administered 2020-04-28: 56 mg via INTRAVENOUS
  Filled 2020-04-28: qty 5.6

## 2020-04-28 MED ORDER — SODIUM CHLORIDE 0.9% FLUSH
10.0000 mL | Freq: Once | INTRAVENOUS | Status: AC
  Administered 2020-04-28: 10 mL
  Filled 2020-04-28: qty 10

## 2020-04-28 MED ORDER — HEPARIN SOD (PORK) LOCK FLUSH 100 UNIT/ML IV SOLN
500.0000 [IU] | Freq: Once | INTRAVENOUS | Status: AC | PRN
  Administered 2020-04-28: 500 [IU]
  Filled 2020-04-28: qty 5

## 2020-04-28 MED ORDER — HEPARIN SOD (PORK) LOCK FLUSH 100 UNIT/ML IV SOLN
500.0000 [IU] | Freq: Once | INTRAVENOUS | Status: DC
  Filled 2020-04-28: qty 5

## 2020-04-28 MED ORDER — SODIUM CHLORIDE 0.9 % IV SOLN
10.0000 mg | Freq: Once | INTRAVENOUS | Status: AC
  Administered 2020-04-28: 10 mg via INTRAVENOUS
  Filled 2020-04-28: qty 10

## 2020-04-28 MED ORDER — PROCHLORPERAZINE MALEATE 10 MG PO TABS
10.0000 mg | ORAL_TABLET | Freq: Once | ORAL | Status: AC
  Administered 2020-04-28: 10 mg via ORAL

## 2020-04-28 MED ORDER — PROCHLORPERAZINE MALEATE 10 MG PO TABS
ORAL_TABLET | ORAL | Status: AC
  Filled 2020-04-28: qty 1

## 2020-04-28 NOTE — Patient Instructions (Signed)

## 2020-04-28 NOTE — Patient Instructions (Signed)
[facility] Discharge Instructions for Patients Receiving Chemotherapy  Today you received the following chemotherapy agents: Padcev   To help prevent nausea and vomiting after your treatment, we encourage you to take your nausea medication as directed.    If you develop nausea and vomiting that is not controlled by your nausea medication, call the clinic.   BELOW ARE SYMPTOMS THAT SHOULD BE REPORTED IMMEDIATELY:  *FEVER GREATER THAN 100.5 F  *CHILLS WITH OR WITHOUT FEVER  NAUSEA AND VOMITING THAT IS NOT CONTROLLED WITH YOUR NAUSEA MEDICATION  *UNUSUAL SHORTNESS OF BREATH  *UNUSUAL BRUISING OR BLEEDING  TENDERNESS IN MOUTH AND THROAT WITH OR WITHOUT PRESENCE OF ULCERS  *URINARY PROBLEMS  *BOWEL PROBLEMS  UNUSUAL RASH Items with * indicate a potential emergency and should be followed up as soon as possible.  Feel free to call the clinic should you have any questions or concerns. The clinic phone number is [phone].  Please show the CHEMO ALERT CARD at check-in to the Emergency Department and triage nurse.   

## 2020-05-07 ENCOUNTER — Other Ambulatory Visit: Payer: Self-pay | Admitting: Medical

## 2020-05-07 DIAGNOSIS — F3289 Other specified depressive episodes: Secondary | ICD-10-CM

## 2020-05-09 ENCOUNTER — Other Ambulatory Visit: Payer: Self-pay

## 2020-05-09 ENCOUNTER — Encounter (HOSPITAL_COMMUNITY): Payer: Self-pay

## 2020-05-09 ENCOUNTER — Ambulatory Visit (HOSPITAL_COMMUNITY)
Admission: RE | Admit: 2020-05-09 | Discharge: 2020-05-09 | Disposition: A | Discharge status: Home / Self Care | Payer: Medicare Other | Source: Ambulatory Visit | Attending: Oncology | Admitting: Oncology

## 2020-05-09 DIAGNOSIS — C679 Malignant neoplasm of bladder, unspecified: Secondary | ICD-10-CM | POA: Insufficient documentation

## 2020-05-09 MED ORDER — IOHEXOL 300 MG/ML  SOLN
100.0000 mL | Freq: Once | INTRAMUSCULAR | Status: AC | PRN
  Administered 2020-05-09: 100 mL via INTRAVENOUS

## 2020-05-09 MED ORDER — SODIUM CHLORIDE 0.9 % IV SOLN
INTRAVENOUS | Status: AC
  Filled 2020-05-09: qty 250

## 2020-05-09 MED ORDER — HEPARIN SOD (PORK) LOCK FLUSH 100 UNIT/ML IV SOLN: 500.0000 [IU] | Freq: Once | INTRAVENOUS | Status: AC

## 2020-05-09 MED ORDER — HEPARIN SOD (PORK) LOCK FLUSH 100 UNIT/ML IV SOLN
INTRAVENOUS | Status: AC
  Administered 2020-05-09: 500 [IU] via INTRAVENOUS
  Filled 2020-05-09: qty 5

## 2020-05-11 ENCOUNTER — Other Ambulatory Visit: Payer: Self-pay

## 2020-05-11 ENCOUNTER — Encounter: Payer: Self-pay | Admitting: Endocrinology

## 2020-05-11 ENCOUNTER — Ambulatory Visit (INDEPENDENT_AMBULATORY_CARE_PROVIDER_SITE_OTHER): Payer: Medicare Other | Admitting: Endocrinology

## 2020-05-11 VITALS — BP 110/60 | HR 86 | Ht 74.0 in | Wt 165.0 lb

## 2020-05-11 DIAGNOSIS — E1121 Type 2 diabetes mellitus with diabetic nephropathy: Secondary | ICD-10-CM

## 2020-05-11 DIAGNOSIS — Z794 Long term (current) use of insulin: Secondary | ICD-10-CM

## 2020-05-11 DIAGNOSIS — N1831 Chronic kidney disease, stage 3a: Secondary | ICD-10-CM | POA: Diagnosis not present

## 2020-05-11 DIAGNOSIS — E1122 Type 2 diabetes mellitus with diabetic chronic kidney disease: Secondary | ICD-10-CM

## 2020-05-11 LAB — POCT GLYCOSYLATED HEMOGLOBIN (HGB A1C): Hemoglobin A1C: 6.5 % — AB (ref 4.0–5.6)

## 2020-05-11 MED ORDER — BASAGLAR KWIKPEN 100 UNIT/ML ~~LOC~~ SOPN
8.0000 [IU] | PEN_INJECTOR | SUBCUTANEOUS | 11 refills | Status: DC
Start: 2020-05-11 — End: 2020-05-11

## 2020-05-11 MED ORDER — INSULIN LISPRO (1 UNIT DIAL) 100 UNIT/ML (KWIKPEN): 2.0000 [IU] | PEN_INJECTOR | Freq: Every day | SUBCUTANEOUS | 2 refills | Status: DC

## 2020-05-11 MED ORDER — BASAGLAR KWIKPEN 100 UNIT/ML ~~LOC~~ SOPN
8.0000 [IU] | PEN_INJECTOR | SUBCUTANEOUS | 11 refills | Status: DC
Start: 2020-05-11 — End: 2020-05-15

## 2020-05-11 NOTE — Progress Notes (Signed)
Subjective:    Patient ID: Danny Wood, male    DOB: [birth year], 69 y.o.   MRN: [id number]  HPI Pt returns for f/u of diabetes mellitus: DM type: Insulin-requiring type 2 DM. Dx'ed: 3810 Complications: CRI Therapy: insulin since dx DKA: never Severe hypoglycemia: never Pancreatitis: never Pancreatic imaging: normal on 2021 CT SDOH: none Other: pt has stage 4 Bladder Ca Interval history: He brings his meter with his cbg's which I have reviewed today.  cbg varies from 113-383.  It is in general higher as the day goes on.   Past Medical History:  Diagnosis Date  . At risk for sleep apnea    STOP-BANG= 4       SENT TO PCP 05-16-2014  . bladder ca dx'd 05/17/14   met dz 12/2017  . Bladder disorder   . Hematuria   . History of renal cell carcinoma    2003  S/P  PARTIAL RIGHT NEPHRECTOMY  . Presence of surgical incision    lumbar diskectomy 05-11-2014-  bandage present  . Urgency of urination     Past Surgical History:  Procedure Laterality Date  . CYSTOSCOPY N/A 09/09/2014   Procedure: CYSTOSCOPY WITH INDOCYANINE GREEN DYE INJECTION AND URETHRAL DILATION;  Surgeon: Alexis Frock, MD;  Location: [facility];  Service: Urology;  Laterality: N/A;  . CYSTOSCOPY WITH BIOPSY N/A 05/17/2014   Procedure: CYSTO WITH BLADDER BIOPSY;  Surgeon: Malka So, MD;  Location: [facility];  Service: Urology;  Laterality: N/A;  . EVALUATION UNDER ANESTHESIA WITH HEMORRHOIDECTOMY  07/07/2012   Procedure: EXAM UNDER ANESTHESIA WITH HEMORRHOIDECTOMY;  Surgeon: Joyice Faster. Cornett, MD;  Location: [place];  Service: General;  Laterality: N/A;  . IR IMAGING GUIDED PORT INSERTION  02/20/2018  . LUMBAR MICRODISCECTOMY  05-11-2014   L4 -- L5 (partial)  . PARTIAL NEPHRECTOMY Right 2003  . ROBOT ASSISTED LAPAROSCOPIC COMPLETE CYSTECT ILEAL CONDUIT N/A 09/09/2014   Procedure: ROBOTIC ASSISTED LAPAROSCOPIC COMPLETE CYSTOPROSTATECTOMY,  ILEAL CONDUIT;  Surgeon: Alexis Frock, MD;  Location: [facility];  Service: Urology;  Laterality: N/A;    Social History   Socioeconomic History  . Marital status: Married    Spouse name: Not on file  . Number of children: Not on file  . Years of education: Not on file  . Highest education level: Not on file  Occupational History  . Not on file  Tobacco Use  . Smoking status: Former Smoker    Packs/day: 0.50    Years: 25.00    Pack years: 12.50    Types: Cigarettes    Quit date: 06/02/2004    Years since quitting: 15.9  . Smokeless tobacco: Never Used  Substance and Sexual Activity  . Alcohol use: Yes    Alcohol/week: 2.0 standard drinks    Types: 2 Standard drinks or equivalent per week  . Drug use: No  . Sexual activity: Not on file  Other Topics Concern  . Not on file  Social History Narrative  . Not on file   Social Determinants of Health   Financial Resource Strain:   . Difficulty of Paying Living Expenses: Not on file  Food Insecurity:   . Worried About Charity fundraiser in the Last Year: Not on file  . Ran Out of Food in the Last Year: Not on file  Transportation Needs:   . Lack of Transportation (Medical): Not on file  . Lack of Transportation (Non-Medical): Not on file  Physical Activity:   .  Days of Exercise per Week: Not on file  . Minutes of Exercise per Session: Not on file  Stress:   . Feeling of Stress : Not on file  Social Connections:   . Frequency of Communication with Friends and Family: Not on file  . Frequency of Social Gatherings with Friends and Family: Not on file  . Attends Religious Services: Not on file  . Active Member of Clubs or Organizations: Not on file  . Attends Archivist Meetings: Not on file  . Marital Status: Not on file  Intimate Partner Violence:   . Fear of Current or Ex-Partner: Not on file  . Emotionally Abused: Not on file  . Physically Abused: Not on file  . Sexually Abused: Not on file    Current Outpatient Medications on File Prior to Visit  Medication Sig  Dispense Refill  . diphenoxylate-atropine (LOMOTIL) 2.5-0.025 MG tablet 1 to 2 PO QID prn diarrhea (Patient taking differently: Take 1-2 tablets by mouth 4 (four) times daily as needed for diarrhea or loose stools. ) 45 tablet 2  . oxyCODONE-acetaminophen (PERCOCET) 10-325 MG tablet Take 1 tablet by mouth every 4 (four) hours as needed for pain. 30 tablet 0  . prochlorperazine (COMPAZINE) 10 MG tablet TAKE 1 TABLET(10 MG) BY MOUTH EVERY 6 HOURS AS NEEDED FOR NAUSEA OR VOMITING 30 tablet 0  . sertraline (ZOLOFT) 25 MG tablet TAKE 1 TABLET(25 MG) BY MOUTH DAILY 30 tablet 1   No current facility-administered medications on file prior to visit.    No Known Allergies  Family History  Problem Relation Age of Onset  . Diabetes Neg Hx     BP 110/60   Pulse 86   Ht _0  (1.88 m)   Wt 165 lb (74.8 kg)   SpO2 96%   BMI 21.18 kg/m    Review of Systems He has regained 10 lbs.     Objective:   Physical Exam VITAL SIGNS:  See vs page GENERAL: no distress Pulses: dorsalis pedis intact bilat.   MSK: no deformity of the feet CV: 1+ bilat leg edema Skin:  no ulcer on the feet.  normal color and temp on the feet. Neuro: sensation is intact to touch on the feet  Lab Results  Component Value Date   HGBA1C 6.5 (A) 05/11/2020       Assessment & Plan:  Insulin-requiring type 2 DM.  Malnutrition: this would normally be glycemic overcontrol.  However, I favor not reducing insulin, due to need to regain more weight.    Patient Instructions  You do not need to be on a limited diet.  You should eat more--whatever you want.   check your blood sugar twice a day.  vary the time of day when you check, between before the 3 meals, and at bedtime.  also check if you have symptoms of your blood sugar being too high or too low.  please keep a record of the readings and bring it to your next appointment here (or you can bring the meter itself).  You can write it on any piece of paper.  please call us  sooner if your blood sugar goes below 70, or if you have a lot of readings over 200.  Please continue the same insulins for now.   Please come back for a follow-up appointment in 2 months.

## 2020-05-11 NOTE — Patient Instructions (Addendum)
You do not need to be on a limited diet.  You should eat more--whatever you want.   check your blood sugar twice a day.  vary the time of day when you check, between before the 3 meals, and at bedtime.  also check if you have symptoms of your blood sugar being too high or too low.  please keep a record of the readings and bring it to your next appointment here (or you can bring the meter itself).  You can write it on any piece of paper.  please call us sooner if your blood sugar goes below 70, or if you have a lot of readings over 200.  Please continue the same insulins for now.   Please come back for a follow-up appointment in 2 months.

## 2020-05-12 ENCOUNTER — Inpatient Hospital Stay (HOSPITAL_BASED_OUTPATIENT_CLINIC_OR_DEPARTMENT_OTHER): Payer: Medicare Other | Admitting: Oncology

## 2020-05-12 ENCOUNTER — Inpatient Hospital Stay: Payer: Medicare Other

## 2020-05-12 ENCOUNTER — Inpatient Hospital Stay: Payer: Medicare Other | Admitting: Nutrition

## 2020-05-12 ENCOUNTER — Other Ambulatory Visit: Payer: Self-pay

## 2020-05-12 ENCOUNTER — Inpatient Hospital Stay: Payer: Medicare Other | Attending: Oncology

## 2020-05-12 VITALS — BP 113/61 | HR 80 | Temp 95.0°F | Resp 18 | Ht 74.0 in | Wt 167.1 lb

## 2020-05-12 DIAGNOSIS — C679 Malignant neoplasm of bladder, unspecified: Secondary | ICD-10-CM

## 2020-05-12 DIAGNOSIS — E860 Dehydration: Secondary | ICD-10-CM | POA: Insufficient documentation

## 2020-05-12 DIAGNOSIS — I700 Atherosclerosis of aorta: Secondary | ICD-10-CM | POA: Insufficient documentation

## 2020-05-12 DIAGNOSIS — Z79899 Other long term (current) drug therapy: Secondary | ICD-10-CM | POA: Insufficient documentation

## 2020-05-12 DIAGNOSIS — R911 Solitary pulmonary nodule: Secondary | ICD-10-CM | POA: Insufficient documentation

## 2020-05-12 DIAGNOSIS — Z5112 Encounter for antineoplastic immunotherapy: Secondary | ICD-10-CM | POA: Diagnosis present

## 2020-05-12 LAB — CBC WITH DIFFERENTIAL (CANCER CENTER ONLY)
Abs Immature Granulocytes: 0.02 10*3/uL (ref 0.00–0.07)
Basophils Absolute: 0 10*3/uL (ref 0.0–0.1)
Basophils Relative: 1 %
Eosinophils Absolute: 0.1 10*3/uL (ref 0.0–0.5)
Eosinophils Relative: 1 %
HCT: 34.7 % — ABNORMAL LOW (ref 39.0–52.0)
Hemoglobin: 12.1 g/dL — ABNORMAL LOW (ref 13.0–17.0)
Immature Granulocytes: 0 %
Lymphocytes Relative: 32 %
Lymphs Abs: 2 10*3/uL (ref 0.7–4.0)
MCH: 30.3 pg (ref 26.0–34.0)
MCHC: 34.9 g/dL (ref 30.0–36.0)
MCV: 86.8 fL (ref 80.0–100.0)
Monocytes Absolute: 0.5 10*3/uL (ref 0.1–1.0)
Monocytes Relative: 8 %
Neutro Abs: 3.7 10*3/uL (ref 1.7–7.7)
Neutrophils Relative %: 58 %
Platelet Count: 224 10*3/uL (ref 150–400)
RBC: 4 MIL/uL — ABNORMAL LOW (ref 4.22–5.81)
RDW: 15.5 % (ref 11.5–15.5)
WBC Count: 6.3 10*3/uL (ref 4.0–10.5)
nRBC: 0 % (ref 0.0–0.2)

## 2020-05-12 LAB — CMP (CANCER CENTER ONLY)
ALT: 26 U/L (ref 0–44)
AST: 27 U/L (ref 15–41)
Albumin: 3.3 g/dL — ABNORMAL LOW (ref 3.5–5.0)
Alkaline Phosphatase: 54 U/L (ref 38–126)
Anion gap: 7 (ref 5–15)
BUN: 24 mg/dL — ABNORMAL HIGH (ref 8–23)
CO2: 26 mmol/L (ref 22–32)
Calcium: 8.6 mg/dL — ABNORMAL LOW (ref 8.9–10.3)
Chloride: 105 mmol/L (ref 98–111)
Creatinine: 1.04 mg/dL (ref 0.61–1.24)
GFR, Est AFR Am: >60 mL/min (ref >60)
GFR, Estimated: >60 mL/min (ref >60)
Glucose, Bld: 139 mg/dL — ABNORMAL HIGH (ref 70–99)
Potassium: 4 mmol/L (ref 3.5–5.1)
Sodium: 138 mmol/L (ref 135–145)
Total Bilirubin: 0.7 mg/dL (ref 0.3–1.2)
Total Protein: 6.1 g/dL — ABNORMAL LOW (ref 6.5–8.1)

## 2020-05-12 MED ORDER — SODIUM CHLORIDE 0.9% FLUSH
10.0000 mL | INTRAVENOUS | Status: DC | PRN
  Administered 2020-05-12: 10 mL
  Filled 2020-05-12: qty 10

## 2020-05-12 MED ORDER — SODIUM CHLORIDE 0.9 % IV SOLN
Freq: Once | INTRAVENOUS | Status: AC
  Filled 2020-05-12: qty 250

## 2020-05-12 MED ORDER — SODIUM CHLORIDE 0.9 % IV SOLN
0.7500 mg/kg | Freq: Once | INTRAVENOUS | Status: AC
  Administered 2020-05-12: 56 mg via INTRAVENOUS
  Filled 2020-05-12: qty 5.6

## 2020-05-12 MED ORDER — PROCHLORPERAZINE MALEATE 10 MG PO TABS
ORAL_TABLET | ORAL | Status: AC
  Filled 2020-05-12: qty 1

## 2020-05-12 MED ORDER — SODIUM CHLORIDE 0.9 % IV SOLN
10.0000 mg | Freq: Once | INTRAVENOUS | Status: AC
  Administered 2020-05-12: 10 mg via INTRAVENOUS
  Filled 2020-05-12: qty 10

## 2020-05-12 MED ORDER — PROCHLORPERAZINE MALEATE 10 MG PO TABS
10.0000 mg | ORAL_TABLET | Freq: Once | ORAL | Status: AC
  Administered 2020-05-12: 10 mg via ORAL

## 2020-05-12 MED ORDER — HEPARIN SOD (PORK) LOCK FLUSH 100 UNIT/ML IV SOLN
500.0000 [IU] | Freq: Once | INTRAVENOUS | Status: AC | PRN
  Administered 2020-05-12: 500 [IU]
  Filled 2020-05-12: qty 5

## 2020-05-12 NOTE — Progress Notes (Signed)
Nutrition follow-up completed with patient during infusion for metastatic bladder cancer. Weight improved and documented 167 pounds September 10. Patient denies nutrition impact symptoms. He has no questions or concerns.  Unintended weight loss resolved.  Patient has contact information for further questions or concerns.  Please refer back to RD if nutrition needs are identified.

## 2020-05-12 NOTE — Telephone Encounter (Signed)
One more! Threasa Beards

## 2020-05-12 NOTE — Progress Notes (Signed)
Hematology and Oncology Follow Up Visit  Danny Wood [id number] Jun 15, 1951 69 y.o. 05/12/2020 12:26 PM [name], [name], MD   Principle Diagnosis: 69 year old man with stage IV high-grade urothelial carcinoma of the bladder noted in 2019.  He was initially diagnosed with localized disease in 2015.   Prior Therapy:  He underwent a cystoscopy and a TURBT on 05/17/2014.   Neoadjuvant systemic chemotherapy in the form of gemcitabine and cisplatin cycle 1 day 1 is on 06/17/2014. He is S/P two cycles completed in 07/2014.  Therapy tolerated poorly with symptoms of nausea and vomiting and worsening renal function.  He is status post robotic cystoprostatectomy and bilateral lymphadenectomy completed on September 09, 2014.  The final pathology revealed T2N0 without any lymph node involvement.  He developed pelvic adenopathy that was biopsy-proven on Jan 14, 2018 to be metastatic urothelial carcinoma.  Carboplatin and gemcitabine cycle 1 started on 02/17/2018.  He completed 8 cycles of therapy in December 2019.  Pembrolizumab 200 mg every 3 weeks started on April 01, 2019.  He is status post 7 cycles of therapy in December 2020.  Therapy stopped because of of patient preference, excellent clinical response and treatment holiday.  He developed progression of disease in June 2021.   Current therapy: Padcev started on February 11, 2020.  He is currently receiving it at  0.75 mg/kg on day 1 and day 15 of each cycle.  He is here for day 1 of cycle 4 of therapy.  Interim History: Mr. Skora returns today for a repeat evaluation.  Since the last visit, he reports continuous improvement in his overall health.  He denies any side effects associated with chemotherapy including nausea, vomiting or excessive fatigue.  He continues to eat better and has gained at least 7 pounds.  He denies any abdominal pain or discomfort.  Continues to have chronic chest wall discomfort which he uses Percocet  for.  Pain is manageable at this time.                 Medications: Reviewed without changes. Current Outpatient Medications  Medication Sig Dispense Refill  . diphenoxylate-atropine (LOMOTIL) 2.5-0.025 MG tablet 1 to 2 PO QID prn diarrhea (Patient taking differently: Take 1-2 tablets by mouth 4 (four) times daily as needed for diarrhea or loose stools. ) 45 tablet 2  . Insulin Glargine (BASAGLAR KWIKPEN) 100 UNIT/ML Inject 8 Units into the skin every morning. 15 mL 11  . insulin lispro (HUMALOG KWIKPEN) 100 UNIT/ML KwikPen Inject 2 Units into the skin daily with supper. 1.2 mL 2  . oxyCODONE-acetaminophen (PERCOCET) 10-325 MG tablet Take 1 tablet by mouth every 4 (four) hours as needed for pain. 30 tablet 0  . prochlorperazine (COMPAZINE) 10 MG tablet TAKE 1 TABLET(10 MG) BY MOUTH EVERY 6 HOURS AS NEEDED FOR NAUSEA OR VOMITING 30 tablet 0  . sertraline (ZOLOFT) 25 MG tablet TAKE 1 TABLET(25 MG) BY MOUTH DAILY 30 tablet 1   No current facility-administered medications for this visit.     Allergies: No Known Allergies    Physical Exam:      Blood pressure 113/61, pulse 80, temperature (!) 95 F (35 C), temperature source Tympanic, resp. rate 18, height 6\' 2"  (1.88 m), weight 167 lb 1.6 oz (75.8 kg), SpO2 100 %.       ECOG: 1   General appearance: Comfortable appearing without any discomfort Head: Normocephalic without any trauma Oropharynx: Mucous membranes are moist and pink without any thrush or ulcers. Eyes:  Pupils are equal and round reactive to light. Lymph nodes: No cervical, supraclavicular, inguinal or axillary lymphadenopathy.   Heart:regular rate and rhythm.  S1 and S2 without leg edema. Lung: Clear without any rhonchi or wheezes.  No dullness to percussion. Abdomin: Soft, nontender, nondistended with good bowel sounds.  No hepatosplenomegaly. Musculoskeletal: No joint deformity or effusion.  Full range of motion noted. Neurological: No deficits  noted on motor, sensory and deep tendon reflex exam. Skin: No petechial rash or dryness.  Appeared moist.           .   Lab Results: Lab Results  Component Value Date   WBC 6.0 04/28/2020   HGB 12.7 (L) 04/28/2020   HCT 36.1 (L) 04/28/2020   MCV 85.1 04/28/2020   PLT 217 04/28/2020     Chemistry       IMPRESSION: 1. No evidence for metastatic disease in the thorax. 2. Interval decrease in retroperitoneal, mesenteric, and pelvic lymphadenopathy. 3. Abnormal enhancement identified in the inferior right kidney previously is not evident today. 4. Status post cysto prostatectomy with right lower quadrant ileal conduit. No substantial hydroureteronephrosis. 5. Stable low volume ascites. 6. Stable tiny bilateral pulmonary nodules. 7. Aortic Atherosclerosis (ICD10-I70.0).    Impression and Plan:  69 year old man with:    1.  Stage IV high-grade urothelial carcinoma of the bladder diagnosed in 2019.  He has documented lymphadenopathy after initially presenting with localized disease in 2015.  His disease status was reviewed at this time.  Imaging studies on 05/09/2020 were discussed at this time.  He has a positive response to therapy with reduction in his lymphadenopathy indicating a partial response after 3 cycles of Padcev.  Risks and benefits of continuing this treatment were discussed at this time.  Potential complications including hyperglycemia, neuropathy and excessive fatigue were reviewed.  He is agreeable to continue at this time.  We will repeat imaging studies after 3 months.  2.  IV access: Port-A-Cath currently in use without any issues..  3.  Dehydration poor p.o. intake: Continues to improve at this time with intermittent hydration.  4.  Pain: Manageable with Percocet which will be refilled for him.   5.  Prognosis: His disease is incurable although aggressive measures are warranted with improved overall performance status.    6. Followup: He will  return on 05/26/2020 to complete the cycle and start cycle 5 on October 7.  30  minutes were spent on this encounter.  The time was dedicated to reviewing his disease status, discussing treatment options and future plan of care review.    [name], MD 9/10/202112:26 PM

## 2020-05-12 NOTE — Patient Instructions (Signed)
[facility] Discharge Instructions for Patients Receiving Chemotherapy  Today you received the following chemotherapy agents: Padcev   To help prevent nausea and vomiting after your treatment, we encourage you to take your nausea medication as directed.    If you develop nausea and vomiting that is not controlled by your nausea medication, call the clinic.   BELOW ARE SYMPTOMS THAT SHOULD BE REPORTED IMMEDIATELY:  *FEVER GREATER THAN 100.5 F  *CHILLS WITH OR WITHOUT FEVER  NAUSEA AND VOMITING THAT IS NOT CONTROLLED WITH YOUR NAUSEA MEDICATION  *UNUSUAL SHORTNESS OF BREATH  *UNUSUAL BRUISING OR BLEEDING  TENDERNESS IN MOUTH AND THROAT WITH OR WITHOUT PRESENCE OF ULCERS  *URINARY PROBLEMS  *BOWEL PROBLEMS  UNUSUAL RASH Items with * indicate a potential emergency and should be followed up as soon as possible.  Feel free to call the clinic should you have any questions or concerns. The clinic phone number is [phone].  Please show the CHEMO ALERT CARD at check-in to the Emergency Department and triage nurse.   

## 2020-05-15 ENCOUNTER — Telehealth: Payer: Self-pay | Admitting: Endocrinology

## 2020-05-15 ENCOUNTER — Other Ambulatory Visit: Payer: Self-pay | Admitting: Oncology

## 2020-05-15 ENCOUNTER — Telehealth: Payer: Self-pay | Admitting: Oncology

## 2020-05-15 ENCOUNTER — Other Ambulatory Visit: Payer: Self-pay

## 2020-05-15 DIAGNOSIS — Z794 Long term (current) use of insulin: Secondary | ICD-10-CM

## 2020-05-15 DIAGNOSIS — N1831 Chronic kidney disease, stage 3a: Secondary | ICD-10-CM

## 2020-05-15 DIAGNOSIS — E1122 Type 2 diabetes mellitus with diabetic chronic kidney disease: Secondary | ICD-10-CM

## 2020-05-15 MED ORDER — BASAGLAR KWIKPEN 100 UNIT/ML ~~LOC~~ SOPN: 8.0000 [IU] | PEN_INJECTOR | SUBCUTANEOUS | 2 refills | Status: DC

## 2020-05-15 MED ORDER — OXYCODONE-ACETAMINOPHEN 10-325 MG PO TABS
1.0000 | ORAL_TABLET | ORAL | 0 refills | Status: DC | PRN
Start: 2020-05-15 — End: 2020-05-30

## 2020-05-15 NOTE — Addendum Note (Signed)
Addended by: Renato Shin on: 05/15/2020 04:35 PM   Modules accepted: Orders

## 2020-05-15 NOTE — Telephone Encounter (Signed)
Scheduled appointment per 9/10 los. Patient is aware of all appointments.

## 2020-05-15 NOTE — Telephone Encounter (Signed)
[name],Danny [phone]  Called # listed above to inform about Dr. Cordelia Pen response below. No answer nor VM either.

## 2020-05-15 NOTE — Telephone Encounter (Signed)
Patient's wife called stating patient needs an Rx for the generic basaglar written and also an Rx for the lisinopril and she will come by and pick it up today or tomorrow.

## 2020-05-15 NOTE — Telephone Encounter (Signed)
Please advise about request for Lisinopril

## 2020-05-15 NOTE — Telephone Encounter (Signed)
I sent basaglar refill.   Lisinopril: Please forward refill request to pt's primary care provider.

## 2020-05-16 ENCOUNTER — Telehealth: Payer: Self-pay

## 2020-05-16 ENCOUNTER — Other Ambulatory Visit: Payer: Self-pay

## 2020-05-16 DIAGNOSIS — Z794 Long term (current) use of insulin: Secondary | ICD-10-CM

## 2020-05-16 DIAGNOSIS — N1831 Chronic kidney disease, stage 3a: Secondary | ICD-10-CM

## 2020-05-16 DIAGNOSIS — E1122 Type 2 diabetes mellitus with diabetic chronic kidney disease: Secondary | ICD-10-CM

## 2020-05-16 MED ORDER — INSULIN LISPRO (1 UNIT DIAL) 100 UNIT/ML (KWIKPEN): 2.0000 [IU] | PEN_INJECTOR | Freq: Every day | SUBCUTANEOUS | 3 refills | Status: DC

## 2020-05-16 NOTE — Telephone Encounter (Signed)
Wife returned call. Upon informing her about Dr. Cordelia Pen response below, wife proceeded to become very loud, argumentative and demanding, stating, "that is not what we spoke about yesterday." Redirected that she and I did not speak yesterday. Before I could elaborate any further, wife stated, "I specifically told you that I needed a hand written prescription for the generic insulin lispro", further added, "you will call the pharmacy and cancel the insulin glargine prescription", finally adding "I did not tell you I needed a refill for Lisinopril". Again, firmly redirected wife that we did not speak yesterday, that she and I are speaking for the first time now. Further advised that she spoke to the front office staff who is not only non-clinical but appears to have misunderstood her request. I then proceeded to read the message written by the front office staff as written below. Advised that given this misinformation, that is why I called x2 to advise about Dr. Cordelia Pen response below AND with a reminder to call the PCP for a refill of Lisinopril. In addition, educated that we do not need to place a call to the pharmacy to cancel the [place] Rx as it is within her and the pt's right to decline any refill sent to the pharmacy. Also educated that all Rx's written by Dr. Loanne Drilling are written for the generic name and will not be ordered for a BRAND name UNLESS Rx clearly states DAW. Again reminded that none of the Rx's written by Dr. Loanne Drilling are for [place]. Asked that she specifically advise me of her need so this issue can be resolved without any further delay nor misunderstanding. Wife stated that she has a written Rx for "insulin glargine/Basaglar" but is in need of a written Rx for "insulin lispro/Humalog". Reminded again, the Rx will be printed and signed by Dr. Loanne Drilling for the generic name of the medication but will also display the brand name as well. Firmly reminded that the Rx DOES NOT state DAW and will be  therefore written correctly as it had in the past. Wife states she understands and will pick up the Rx after 3pm today.  Rx for "insulin lispro (HUMALOG KWIKPEN)" has been written, printed and signed by Dr. Loanne Drilling. Rx has been placed at the front desk for wife's pick up.

## 2020-05-16 NOTE — Telephone Encounter (Signed)
-----   Message from Wyatt Portela, MD sent at 05/15/2020  9:38 AM EDT ----- Sent. Thanks ----- Message ----- From: Kennedy Bucker, LPN Sent: 05/18/3845   9:31 AM EDT To: Merril Abbe, LPN, Wyatt Portela, MD  Hello, Patient called in and states that he was supposed to have his Percocet to sent to Pharmacy after his visit on Friday. And it had not been called in.   Thank you  Maudie Mercury LPN

## 2020-05-16 NOTE — Telephone Encounter (Signed)
SECOND ATTEMPT:  Mak, [place] [phone]  Called spouse to inform about Dr. Cordelia Pen response below. LVM requesting returned call.

## 2020-05-16 NOTE — Telephone Encounter (Signed)
Medication sent per Dr Alen Blew.

## 2020-05-24 ENCOUNTER — Other Ambulatory Visit: Payer: Self-pay | Admitting: *Deleted

## 2020-05-24 DIAGNOSIS — C679 Malignant neoplasm of bladder, unspecified: Secondary | ICD-10-CM

## 2020-05-26 ENCOUNTER — Inpatient Hospital Stay: Payer: Medicare Other

## 2020-05-26 ENCOUNTER — Other Ambulatory Visit: Payer: Self-pay

## 2020-05-26 VITALS — BP 131/78 | HR 68 | Temp 97.5°F | Resp 18 | Wt 171.5 lb

## 2020-05-26 DIAGNOSIS — Z5112 Encounter for antineoplastic immunotherapy: Secondary | ICD-10-CM | POA: Diagnosis not present

## 2020-05-26 DIAGNOSIS — C679 Malignant neoplasm of bladder, unspecified: Secondary | ICD-10-CM

## 2020-05-26 DIAGNOSIS — Z95828 Presence of other vascular implants and grafts: Secondary | ICD-10-CM

## 2020-05-26 LAB — CMP (CANCER CENTER ONLY)
ALT: 29 U/L (ref 0–44)
AST: 26 U/L (ref 15–41)
Albumin: 3.1 g/dL — ABNORMAL LOW (ref 3.5–5.0)
Alkaline Phosphatase: 57 U/L (ref 38–126)
Anion gap: 5 (ref 5–15)
BUN: 17 mg/dL (ref 8–23)
CO2: 28 mmol/L (ref 22–32)
Calcium: 8.4 mg/dL — ABNORMAL LOW (ref 8.9–10.3)
Chloride: 105 mmol/L (ref 98–111)
Creatinine: 0.84 mg/dL (ref 0.61–1.24)
GFR, Est AFR Am: >60 mL/min (ref >60)
GFR, Estimated: >60 mL/min (ref >60)
Glucose, Bld: 169 mg/dL — ABNORMAL HIGH (ref 70–99)
Potassium: 4.2 mmol/L (ref 3.5–5.1)
Sodium: 138 mmol/L (ref 135–145)
Total Bilirubin: 0.5 mg/dL (ref 0.3–1.2)
Total Protein: 5.9 g/dL — ABNORMAL LOW (ref 6.5–8.1)

## 2020-05-26 LAB — CBC WITH DIFFERENTIAL (CANCER CENTER ONLY)
Abs Immature Granulocytes: 0.03 10*3/uL (ref 0.00–0.07)
Basophils Absolute: 0 10*3/uL (ref 0.0–0.1)
Basophils Relative: 1 %
Eosinophils Absolute: 0.1 10*3/uL (ref 0.0–0.5)
Eosinophils Relative: 1 %
HCT: 35.7 % — ABNORMAL LOW (ref 39.0–52.0)
Hemoglobin: 12.5 g/dL — ABNORMAL LOW (ref 13.0–17.0)
Immature Granulocytes: 1 %
Lymphocytes Relative: 27 %
Lymphs Abs: 1.5 10*3/uL (ref 0.7–4.0)
MCH: 30.9 pg (ref 26.0–34.0)
MCHC: 35 g/dL (ref 30.0–36.0)
MCV: 88.1 fL (ref 80.0–100.0)
Monocytes Absolute: 0.5 10*3/uL (ref 0.1–1.0)
Monocytes Relative: 9 %
Neutro Abs: 3.4 10*3/uL (ref 1.7–7.7)
Neutrophils Relative %: 61 %
Platelet Count: 185 10*3/uL (ref 150–400)
RBC: 4.05 MIL/uL — ABNORMAL LOW (ref 4.22–5.81)
RDW: 15.2 % (ref 11.5–15.5)
WBC Count: 5.5 10*3/uL (ref 4.0–10.5)
nRBC: 0 % (ref 0.0–0.2)

## 2020-05-26 MED ORDER — SODIUM CHLORIDE 0.9% FLUSH
10.0000 mL | INTRAVENOUS | Status: DC | PRN
  Administered 2020-05-26: 10 mL
  Filled 2020-05-26: qty 10

## 2020-05-26 MED ORDER — HEPARIN SOD (PORK) LOCK FLUSH 100 UNIT/ML IV SOLN
500.0000 [IU] | Freq: Once | INTRAVENOUS | Status: AC | PRN
  Administered 2020-05-26: 500 [IU]
  Filled 2020-05-26: qty 5

## 2020-05-26 MED ORDER — PROCHLORPERAZINE MALEATE 10 MG PO TABS
ORAL_TABLET | ORAL | Status: AC
  Filled 2020-05-26: qty 1

## 2020-05-26 MED ORDER — SODIUM CHLORIDE 0.9 % IV SOLN
Freq: Once | INTRAVENOUS | Status: AC
  Filled 2020-05-26: qty 250

## 2020-05-26 MED ORDER — SODIUM CHLORIDE 0.9 % IV SOLN
0.7500 mg/kg | Freq: Once | INTRAVENOUS | Status: AC
  Administered 2020-05-26: 56 mg via INTRAVENOUS
  Filled 2020-05-26: qty 5.6

## 2020-05-26 MED ORDER — SODIUM CHLORIDE 0.9 % IV SOLN
10.0000 mg | Freq: Once | INTRAVENOUS | Status: AC
  Administered 2020-05-26: 10 mg via INTRAVENOUS
  Filled 2020-05-26: qty 10

## 2020-05-26 MED ORDER — PROCHLORPERAZINE MALEATE 10 MG PO TABS
10.0000 mg | ORAL_TABLET | Freq: Once | ORAL | Status: AC
  Administered 2020-05-26: 10 mg via ORAL

## 2020-05-26 MED ORDER — SODIUM CHLORIDE 0.9% FLUSH
10.0000 mL | Freq: Once | INTRAVENOUS | Status: AC | PRN
  Administered 2020-05-26: 10 mL
  Filled 2020-05-26: qty 10

## 2020-05-26 NOTE — Patient Instructions (Signed)
[facility] Discharge Instructions for Patients Receiving Chemotherapy  Today you received the following chemotherapy agents: padcev  To help prevent nausea and vomiting after your treatment, we encourage you to take your nausea medication as directed.    If you develop nausea and vomiting that is not controlled by your nausea medication, call the clinic.   BELOW ARE SYMPTOMS THAT SHOULD BE REPORTED IMMEDIATELY:  *FEVER GREATER THAN 100.5 F  *CHILLS WITH OR WITHOUT FEVER  NAUSEA AND VOMITING THAT IS NOT CONTROLLED WITH YOUR NAUSEA MEDICATION  *UNUSUAL SHORTNESS OF BREATH  *UNUSUAL BRUISING OR BLEEDING  TENDERNESS IN MOUTH AND THROAT WITH OR WITHOUT PRESENCE OF ULCERS  *URINARY PROBLEMS  *BOWEL PROBLEMS  UNUSUAL RASH Items with * indicate a potential emergency and should be followed up as soon as possible.  Feel free to call the clinic should you have any questions or concerns. The clinic phone number is [phone].  Please show the [facility] at check-in to the Emergency Department and triage nurse.

## 2020-05-26 NOTE — Patient Instructions (Signed)

## 2020-05-30 ENCOUNTER — Other Ambulatory Visit: Payer: Self-pay | Admitting: Oncology

## 2020-05-30 MED ORDER — OXYCODONE-ACETAMINOPHEN 10-325 MG PO TABS
1.0000 | ORAL_TABLET | ORAL | 0 refills | Status: DC | PRN
Start: 2020-05-30 — End: 2020-06-15

## 2020-06-08 ENCOUNTER — Inpatient Hospital Stay (HOSPITAL_BASED_OUTPATIENT_CLINIC_OR_DEPARTMENT_OTHER): Payer: Medicare Other | Admitting: Oncology

## 2020-06-08 ENCOUNTER — Inpatient Hospital Stay: Payer: Medicare Other

## 2020-06-08 ENCOUNTER — Other Ambulatory Visit: Payer: Self-pay

## 2020-06-08 ENCOUNTER — Inpatient Hospital Stay: Payer: Medicare Other | Attending: Oncology

## 2020-06-08 VITALS — BP 128/74 | HR 84 | Temp 95.6°F | Resp 18 | Ht 74.0 in

## 2020-06-08 DIAGNOSIS — Z79899 Other long term (current) drug therapy: Secondary | ICD-10-CM | POA: Diagnosis not present

## 2020-06-08 DIAGNOSIS — G629 Polyneuropathy, unspecified: Secondary | ICD-10-CM | POA: Insufficient documentation

## 2020-06-08 DIAGNOSIS — C679 Malignant neoplasm of bladder, unspecified: Secondary | ICD-10-CM

## 2020-06-08 DIAGNOSIS — Z5112 Encounter for antineoplastic immunotherapy: Secondary | ICD-10-CM | POA: Diagnosis present

## 2020-06-08 DIAGNOSIS — C775 Secondary and unspecified malignant neoplasm of intrapelvic lymph nodes: Secondary | ICD-10-CM | POA: Diagnosis not present

## 2020-06-08 DIAGNOSIS — M79606 Pain in leg, unspecified: Secondary | ICD-10-CM | POA: Insufficient documentation

## 2020-06-08 LAB — CMP (CANCER CENTER ONLY)
ALT: 29 U/L (ref 0–44)
AST: 25 U/L (ref 15–41)
Albumin: 3.2 g/dL — ABNORMAL LOW (ref 3.5–5.0)
Alkaline Phosphatase: 74 U/L (ref 38–126)
Anion gap: 3 — ABNORMAL LOW (ref 5–15)
BUN: 19 mg/dL (ref 8–23)
CO2: 29 mmol/L (ref 22–32)
Calcium: 8.9 mg/dL (ref 8.9–10.3)
Chloride: 103 mmol/L (ref 98–111)
Creatinine: 0.84 mg/dL (ref 0.61–1.24)
GFR, Estimated: >60 mL/min (ref >60)
Glucose, Bld: 242 mg/dL — ABNORMAL HIGH (ref 70–99)
Potassium: 4 mmol/L (ref 3.5–5.1)
Sodium: 135 mmol/L (ref 135–145)
Total Bilirubin: 0.4 mg/dL (ref 0.3–1.2)
Total Protein: 6.2 g/dL — ABNORMAL LOW (ref 6.5–8.1)

## 2020-06-08 LAB — CBC WITH DIFFERENTIAL (CANCER CENTER ONLY)
Abs Immature Granulocytes: 0.02 10*3/uL (ref 0.00–0.07)
Basophils Absolute: 0 10*3/uL (ref 0.0–0.1)
Basophils Relative: 0 %
Eosinophils Absolute: 0 10*3/uL (ref 0.0–0.5)
Eosinophils Relative: 1 %
HCT: 37 % — ABNORMAL LOW (ref 39.0–52.0)
Hemoglobin: 13.1 g/dL (ref 13.0–17.0)
Immature Granulocytes: 0 %
Lymphocytes Relative: 25 %
Lymphs Abs: 1.8 10*3/uL (ref 0.7–4.0)
MCH: 31.5 pg (ref 26.0–34.0)
MCHC: 35.4 g/dL (ref 30.0–36.0)
MCV: 88.9 fL (ref 80.0–100.0)
Monocytes Absolute: 0.7 10*3/uL (ref 0.1–1.0)
Monocytes Relative: 10 %
Neutro Abs: 4.5 10*3/uL (ref 1.7–7.7)
Neutrophils Relative %: 64 %
Platelet Count: 212 10*3/uL (ref 150–400)
RBC: 4.16 MIL/uL — ABNORMAL LOW (ref 4.22–5.81)
RDW: 14.3 % (ref 11.5–15.5)
WBC Count: 7 10*3/uL (ref 4.0–10.5)
nRBC: 0 % (ref 0.0–0.2)

## 2020-06-08 MED ORDER — PROCHLORPERAZINE MALEATE 10 MG PO TABS
ORAL_TABLET | ORAL | Status: AC
  Filled 2020-06-08: qty 1

## 2020-06-08 MED ORDER — SODIUM CHLORIDE 0.9 % IV SOLN
0.7500 mg/kg | Freq: Once | INTRAVENOUS | Status: AC
  Administered 2020-06-08: 56 mg via INTRAVENOUS
  Filled 2020-06-08: qty 5.6

## 2020-06-08 MED ORDER — SODIUM CHLORIDE 0.9 % IV SOLN
Freq: Once | INTRAVENOUS | Status: AC
  Filled 2020-06-08: qty 250

## 2020-06-08 MED ORDER — SODIUM CHLORIDE 0.9 % IV SOLN
10.0000 mg | Freq: Once | INTRAVENOUS | Status: AC
  Administered 2020-06-08: 10 mg via INTRAVENOUS
  Filled 2020-06-08: qty 10

## 2020-06-08 MED ORDER — PROCHLORPERAZINE MALEATE 10 MG PO TABS
10.0000 mg | ORAL_TABLET | Freq: Once | ORAL | Status: AC
  Administered 2020-06-08: 10 mg via ORAL

## 2020-06-08 MED ORDER — HEPARIN SOD (PORK) LOCK FLUSH 100 UNIT/ML IV SOLN
500.0000 [IU] | Freq: Once | INTRAVENOUS | Status: AC | PRN
  Administered 2020-06-08: 500 [IU]
  Filled 2020-06-08: qty 5

## 2020-06-08 MED ORDER — SODIUM CHLORIDE 0.9% FLUSH
10.0000 mL | INTRAVENOUS | Status: DC | PRN
  Administered 2020-06-08: 10 mL
  Filled 2020-06-08: qty 10

## 2020-06-08 NOTE — Patient Instructions (Signed)
[facility] Discharge Instructions for Patients Receiving Chemotherapy  Today you received the following chemotherapy agents: Padcev   To help prevent nausea and vomiting after your treatment, we encourage you to take your nausea medication as directed.    If you develop nausea and vomiting that is not controlled by your nausea medication, call the clinic.   BELOW ARE SYMPTOMS THAT SHOULD BE REPORTED IMMEDIATELY:  *FEVER GREATER THAN 100.5 F  *CHILLS WITH OR WITHOUT FEVER  NAUSEA AND VOMITING THAT IS NOT CONTROLLED WITH YOUR NAUSEA MEDICATION  *UNUSUAL SHORTNESS OF BREATH  *UNUSUAL BRUISING OR BLEEDING  TENDERNESS IN MOUTH AND THROAT WITH OR WITHOUT PRESENCE OF ULCERS  *URINARY PROBLEMS  *BOWEL PROBLEMS  UNUSUAL RASH Items with * indicate a potential emergency and should be followed up as soon as possible.  Feel free to call the clinic should you have any questions or concerns. The clinic phone number is [phone].  Please show the CHEMO ALERT CARD at check-in to the Emergency Department and triage nurse.   

## 2020-06-08 NOTE — Progress Notes (Signed)
Hematology and Oncology Follow Up Visit  Danny Wood [id number] [birth year] 69 y.o. 06/08/2020 11:23 AM Danny Wood, Danny Brow, MD   Principle Diagnosis: 69 year old man with bladder cancer diagnosed in 2015.  He developed stage IV high-grade urothelial carcinoma with adenopathy. Prior Therapy:  He underwent a cystoscopy and a TURBT on 05/17/2014.   Neoadjuvant systemic chemotherapy in the form of gemcitabine and cisplatin cycle 1 day 1 is on 06/17/2014. He is S/P two cycles completed in 07/2014.  Therapy tolerated poorly with symptoms of nausea and vomiting and worsening renal function.  He is status post robotic cystoprostatectomy and bilateral lymphadenectomy completed on September 09, 2014.  The final pathology revealed T2N0 without any lymph node involvement.  He developed pelvic adenopathy that was biopsy-proven on Jan 14, 2018 to be metastatic urothelial carcinoma.  Carboplatin and gemcitabine cycle 1 started on 02/17/2018.  He completed 8 cycles of therapy in December 2019.  Pembrolizumab 200 mg every 3 weeks started on April 01, 2019.  He is status post 7 cycles of therapy in December 2020.  Therapy stopped because of of patient preference, excellent clinical response and treatment holiday.  He developed progression of disease in June 2021.   Current therapy: Padcev started on February 11, 2020.  He is currently receiving it at  0.75 mg/kg on day 1 and day 15 of each cycle.  He is here for day 1 of cycle 5 of therapy.  Interim History: Danny Wood presents today for a follow-up visit.  Since last visit, he reports no major changes in his health.  He continues to tolerate current therapy without any recent complaints.  He denies any nausea, vomiting or abdominal pain.  He does report some occasional pelvic discomfort and leg pain that is manageable with Percocet.  His appetite is improving and weight overall stable.  He does report very faint sensory neuropathy in his  toes.                 Medications: Unchanged on review.. Current Outpatient Medications  Medication Sig Dispense Refill  . diphenoxylate-atropine (LOMOTIL) 2.5-0.025 MG tablet 1 to 2 PO QID prn diarrhea (Patient taking differently: Take 1-2 tablets by mouth 4 (four) times daily as needed for diarrhea or loose stools. ) 45 tablet 2  . Insulin Glargine (BASAGLAR KWIKPEN) 100 UNIT/ML Inject 8 Units into the skin every morning. 9 mL 2  . insulin lispro (HUMALOG KWIKPEN) 100 UNIT/ML KwikPen Inject 2 Units into the skin daily with supper. 1.8 mL 3  . oxyCODONE-acetaminophen (PERCOCET) 10-325 MG tablet Take 1 tablet by mouth every 4 (four) hours as needed for pain. 30 tablet 0  . prochlorperazine (COMPAZINE) 10 MG tablet TAKE 1 TABLET(10 MG) BY MOUTH EVERY 6 HOURS AS NEEDED FOR NAUSEA OR VOMITING 30 tablet 0  . sertraline (ZOLOFT) 25 MG tablet TAKE 1 TABLET(25 MG) BY MOUTH DAILY 30 tablet 1   No current facility-administered medications for this visit.     Allergies: No Known Allergies    Physical Exam:        Blood pressure 128/74, pulse 84, temperature (!) 95.6 F (35.3 C), temperature source Tympanic, resp. rate 18, height 6\' 2"  (1.88 m), SpO2 99 %.     ECOG: 1    General appearance: Alert, awake without any distress. Head: Atraumatic without abnormalities Oropharynx: Without any thrush or ulcers. Eyes: No scleral icterus. Lymph nodes: No lymphadenopathy noted in the cervical, supraclavicular, or axillary nodes Heart:regular rate and rhythm, without any murmurs  or gallops.   Lung: Clear to auscultation without any rhonchi, wheezes or dullness to percussion. Abdomin: Soft, nontender without any shifting dullness or ascites. Musculoskeletal: No clubbing or cyanosis. Neurological: No motor or sensory deficits. Skin: No rashes or lesions.          .   Lab Results: Lab Results  Component Value Date   WBC 5.5 05/26/2020   HGB 12.5 (L) 05/26/2020    HCT 35.7 (L) 05/26/2020   MCV 88.1 05/26/2020   PLT 185 05/26/2020     Chemistry         Impression and Plan:  69 year old man with:    1.  Bladder cancer diagnosed in 2015.  He subsequently developed stage IV high-grade urothelial carcinoma with lymphadenopathy.  He is currently receiving Padcev with excellent clinical tolerance and benefit without any major complications.  Risks and benefits of proceeding with cycle five at the current dose and schedule were reviewed.  Complications including hyperglycemia, neuropathy among others were discussed.  He is agreeable to continue at this time.  We will repeat imaging studies in December 2021.  2.  IV access: Port-A-Cath remains in place and will be flushed periodically.  3.  Poor p.o. intake: Continues to improve at this time with the treatment of his cancer.  4.  Pain: He is currently on Percocet which has managed his pain reasonably well.   5.  Prognosis: Therapy remains palliative although aggressive measures are warranted given his reasonable performance status.    6. Followup: He will return in 2 weeks for day 14 of the current cycle and in 4 weeks for the start of the next cycle of therapy.  30  minutes were dedicated to this encounter.  The time was spent on reviewing his disease status, discussing treatment options and addressing complications related to his cancer and cancer therapy.    [name], MD 10/7/202111:23 AM

## 2020-06-15 ENCOUNTER — Other Ambulatory Visit: Payer: Self-pay | Admitting: Oncology

## 2020-06-15 MED ORDER — OXYCODONE-ACETAMINOPHEN 10-325 MG PO TABS
1.0000 | ORAL_TABLET | ORAL | 0 refills | Status: DC | PRN
Start: 2020-06-15 — End: 2020-07-07

## 2020-06-22 ENCOUNTER — Other Ambulatory Visit: Payer: Self-pay | Admitting: Lab

## 2020-06-22 ENCOUNTER — Inpatient Hospital Stay: Payer: Medicare Other

## 2020-06-22 ENCOUNTER — Other Ambulatory Visit: Payer: Self-pay

## 2020-06-22 ENCOUNTER — Inpatient Hospital Stay (HOSPITAL_BASED_OUTPATIENT_CLINIC_OR_DEPARTMENT_OTHER): Payer: Medicare Other | Admitting: Medical

## 2020-06-22 VITALS — BP 109/70 | HR 64 | Temp 98.3°F | Resp 17 | Wt 172.5 lb

## 2020-06-22 DIAGNOSIS — Z95828 Presence of other vascular implants and grafts: Secondary | ICD-10-CM

## 2020-06-22 DIAGNOSIS — C679 Malignant neoplasm of bladder, unspecified: Secondary | ICD-10-CM

## 2020-06-22 DIAGNOSIS — Z5112 Encounter for antineoplastic immunotherapy: Secondary | ICD-10-CM | POA: Diagnosis not present

## 2020-06-22 LAB — CBC WITH DIFFERENTIAL (CANCER CENTER ONLY)
Abs Immature Granulocytes: 0.01 10*3/uL (ref 0.00–0.07)
Basophils Absolute: 0 10*3/uL (ref 0.0–0.1)
Basophils Relative: 1 %
Eosinophils Absolute: 0.1 10*3/uL (ref 0.0–0.5)
Eosinophils Relative: 1 %
HCT: 36.6 % — ABNORMAL LOW (ref 39.0–52.0)
Hemoglobin: 12.6 g/dL — ABNORMAL LOW (ref 13.0–17.0)
Immature Granulocytes: 0 %
Lymphocytes Relative: 27 %
Lymphs Abs: 1.6 10*3/uL (ref 0.7–4.0)
MCH: 31 pg (ref 26.0–34.0)
MCHC: 34.4 g/dL (ref 30.0–36.0)
MCV: 89.9 fL (ref 80.0–100.0)
Monocytes Absolute: 0.6 10*3/uL (ref 0.1–1.0)
Monocytes Relative: 10 %
Neutro Abs: 3.6 10*3/uL (ref 1.7–7.7)
Neutrophils Relative %: 61 %
Platelet Count: 180 10*3/uL (ref 150–400)
RBC: 4.07 MIL/uL — ABNORMAL LOW (ref 4.22–5.81)
RDW: 13.6 % (ref 11.5–15.5)
WBC Count: 5.8 10*3/uL (ref 4.0–10.5)
nRBC: 0 % (ref 0.0–0.2)

## 2020-06-22 LAB — CMP (CANCER CENTER ONLY)
ALT: 24 U/L (ref 0–44)
AST: 27 U/L (ref 15–41)
Albumin: 3.2 g/dL — ABNORMAL LOW (ref 3.5–5.0)
Alkaline Phosphatase: 56 U/L (ref 38–126)
Anion gap: 7 (ref 5–15)
BUN: 22 mg/dL (ref 8–23)
CO2: 28 mmol/L (ref 22–32)
Calcium: 8.6 mg/dL — ABNORMAL LOW (ref 8.9–10.3)
Chloride: 105 mmol/L (ref 98–111)
Creatinine: 0.88 mg/dL (ref 0.61–1.24)
GFR, Estimated: >60 mL/min (ref >60)
Glucose, Bld: 130 mg/dL — ABNORMAL HIGH (ref 70–99)
Potassium: 4.2 mmol/L (ref 3.5–5.1)
Sodium: 140 mmol/L (ref 135–145)
Total Bilirubin: 0.4 mg/dL (ref 0.3–1.2)
Total Protein: 5.8 g/dL — ABNORMAL LOW (ref 6.5–8.1)

## 2020-06-22 MED ORDER — HEPARIN SOD (PORK) LOCK FLUSH 100 UNIT/ML IV SOLN
500.0000 [IU] | Freq: Once | INTRAVENOUS | Status: AC | PRN
  Administered 2020-06-22: 500 [IU]
  Filled 2020-06-22: qty 5

## 2020-06-22 MED ORDER — PROCHLORPERAZINE MALEATE 10 MG PO TABS
ORAL_TABLET | ORAL | Status: AC
  Filled 2020-06-22: qty 1

## 2020-06-22 MED ORDER — SODIUM CHLORIDE 0.9 % IV SOLN
0.7500 mg/kg | Freq: Once | INTRAVENOUS | Status: AC
  Administered 2020-06-22: 56 mg via INTRAVENOUS
  Filled 2020-06-22: qty 5.6

## 2020-06-22 MED ORDER — SODIUM CHLORIDE 0.9% FLUSH
10.0000 mL | INTRAVENOUS | Status: DC | PRN
  Administered 2020-06-22: 10 mL
  Filled 2020-06-22: qty 10

## 2020-06-22 MED ORDER — SODIUM CHLORIDE 0.9 % IV SOLN
10.0000 mg | Freq: Once | INTRAVENOUS | Status: AC
  Administered 2020-06-22: 10 mg via INTRAVENOUS
  Filled 2020-06-22: qty 10

## 2020-06-22 MED ORDER — PROCHLORPERAZINE MALEATE 10 MG PO TABS
10.0000 mg | ORAL_TABLET | Freq: Once | ORAL | Status: AC
  Administered 2020-06-22: 10 mg via ORAL

## 2020-06-22 MED ORDER — SODIUM CHLORIDE 0.9 % IV SOLN
Freq: Once | INTRAVENOUS | Status: AC
  Filled 2020-06-22: qty 250

## 2020-06-22 MED ORDER — SODIUM CHLORIDE 0.9% FLUSH
10.0000 mL | Freq: Once | INTRAVENOUS | Status: AC
  Administered 2020-06-22: 10 mL
  Filled 2020-06-22: qty 10

## 2020-06-22 NOTE — Patient Instructions (Signed)
[facility] Discharge Instructions for Patients Receiving Chemotherapy  Today you received the following chemotherapy agents: Padcev   To help prevent nausea and vomiting after your treatment, we encourage you to take your nausea medication as directed.    If you develop nausea and vomiting that is not controlled by your nausea medication, call the clinic.   BELOW ARE SYMPTOMS THAT SHOULD BE REPORTED IMMEDIATELY:  *FEVER GREATER THAN 100.5 F  *CHILLS WITH OR WITHOUT FEVER  NAUSEA AND VOMITING THAT IS NOT CONTROLLED WITH YOUR NAUSEA MEDICATION  *UNUSUAL SHORTNESS OF BREATH  *UNUSUAL BRUISING OR BLEEDING  TENDERNESS IN MOUTH AND THROAT WITH OR WITHOUT PRESENCE OF ULCERS  *URINARY PROBLEMS  *BOWEL PROBLEMS  UNUSUAL RASH Items with * indicate a potential emergency and should be followed up as soon as possible.  Feel free to call the clinic should you have any questions or concerns. The clinic phone number is [phone].  Please show the CHEMO ALERT CARD at check-in to the Emergency Department and triage nurse.   

## 2020-06-26 NOTE — Progress Notes (Signed)
The patient was seen in the infusion room today as he is being treated.  He reports a longstanding swelling in his right lower extremity.  This is unchanged from his prior presentations.  He was seen as he needed a further understanding of why his right lower extremity is swelling.  Scans were reviewed it was noted that he has an area of nodal masses in his abdomen and lymphadenopathy in his right inguinal area.  Also it was noted that the patient has anemia and hypoalbuminemia.  It was discussed with the patient that his right lower extremity edema is related to a combination of these.  He was told to elevate his legs as much as possible and to consider using a thigh-high pressure stocking.  He was also told to avoid extra sodium in his diet.  Sandi Mealy, MHS, PA-C Physician Assistant

## 2020-06-30 ENCOUNTER — Telehealth: Payer: Self-pay | Admitting: Oncology

## 2020-06-30 NOTE — Telephone Encounter (Signed)
Scheduled per los, patient has been called and notified. 

## 2020-07-06 ENCOUNTER — Other Ambulatory Visit: Payer: Self-pay

## 2020-07-06 ENCOUNTER — Inpatient Hospital Stay (HOSPITAL_BASED_OUTPATIENT_CLINIC_OR_DEPARTMENT_OTHER): Payer: Medicare Other | Admitting: Oncology

## 2020-07-06 ENCOUNTER — Inpatient Hospital Stay: Payer: Medicare Other

## 2020-07-06 ENCOUNTER — Inpatient Hospital Stay: Payer: Medicare Other | Attending: Oncology

## 2020-07-06 VITALS — BP 127/73 | HR 65 | Temp 97.8°F | Resp 18 | Ht 74.0 in | Wt 172.8 lb

## 2020-07-06 DIAGNOSIS — Z79899 Other long term (current) drug therapy: Secondary | ICD-10-CM | POA: Diagnosis not present

## 2020-07-06 DIAGNOSIS — M79659 Pain in unspecified thigh: Secondary | ICD-10-CM | POA: Diagnosis not present

## 2020-07-06 DIAGNOSIS — Z95828 Presence of other vascular implants and grafts: Secondary | ICD-10-CM

## 2020-07-06 DIAGNOSIS — Z5112 Encounter for antineoplastic immunotherapy: Secondary | ICD-10-CM | POA: Insufficient documentation

## 2020-07-06 DIAGNOSIS — M79604 Pain in right leg: Secondary | ICD-10-CM

## 2020-07-06 DIAGNOSIS — C679 Malignant neoplasm of bladder, unspecified: Secondary | ICD-10-CM

## 2020-07-06 DIAGNOSIS — R590 Localized enlarged lymph nodes: Secondary | ICD-10-CM | POA: Insufficient documentation

## 2020-07-06 LAB — CMP (CANCER CENTER ONLY)
ALT: 32 U/L (ref 0–44)
AST: 31 U/L (ref 15–41)
Albumin: 3.4 g/dL — ABNORMAL LOW (ref 3.5–5.0)
Alkaline Phosphatase: 61 U/L (ref 38–126)
Anion gap: 6 (ref 5–15)
BUN: 23 mg/dL (ref 8–23)
CO2: 26 mmol/L (ref 22–32)
Calcium: 8.4 mg/dL — ABNORMAL LOW (ref 8.9–10.3)
Chloride: 106 mmol/L (ref 98–111)
Creatinine: 0.9 mg/dL (ref 0.61–1.24)
GFR, Estimated: >60 mL/min (ref >60)
Glucose, Bld: 142 mg/dL — ABNORMAL HIGH (ref 70–99)
Potassium: 4.3 mmol/L (ref 3.5–5.1)
Sodium: 138 mmol/L (ref 135–145)
Total Bilirubin: 0.4 mg/dL (ref 0.3–1.2)
Total Protein: 6.1 g/dL — ABNORMAL LOW (ref 6.5–8.1)

## 2020-07-06 LAB — CBC WITH DIFFERENTIAL (CANCER CENTER ONLY)
Abs Immature Granulocytes: 0.02 10*3/uL (ref 0.00–0.07)
Basophils Absolute: 0 10*3/uL (ref 0.0–0.1)
Basophils Relative: 1 %
Eosinophils Absolute: 0.1 10*3/uL (ref 0.0–0.5)
Eosinophils Relative: 1 %
HCT: 37.9 % — ABNORMAL LOW (ref 39.0–52.0)
Hemoglobin: 13.4 g/dL (ref 13.0–17.0)
Immature Granulocytes: 0 %
Lymphocytes Relative: 26 %
Lymphs Abs: 1.7 10*3/uL (ref 0.7–4.0)
MCH: 30.8 pg (ref 26.0–34.0)
MCHC: 35.4 g/dL (ref 30.0–36.0)
MCV: 87.1 fL (ref 80.0–100.0)
Monocytes Absolute: 0.5 10*3/uL (ref 0.1–1.0)
Monocytes Relative: 8 %
Neutro Abs: 4.2 10*3/uL (ref 1.7–7.7)
Neutrophils Relative %: 64 %
Platelet Count: 191 10*3/uL (ref 150–400)
RBC: 4.35 MIL/uL (ref 4.22–5.81)
RDW: 13.3 % (ref 11.5–15.5)
WBC Count: 6.6 10*3/uL (ref 4.0–10.5)
nRBC: 0 % (ref 0.0–0.2)

## 2020-07-06 MED ORDER — PROCHLORPERAZINE MALEATE 10 MG PO TABS
10.0000 mg | ORAL_TABLET | Freq: Once | ORAL | Status: AC
  Administered 2020-07-06: 10 mg via ORAL

## 2020-07-06 MED ORDER — SODIUM CHLORIDE 0.9% FLUSH
10.0000 mL | INTRAVENOUS | Status: DC | PRN
  Administered 2020-07-06: 10 mL
  Filled 2020-07-06: qty 10

## 2020-07-06 MED ORDER — SODIUM CHLORIDE 0.9 % IV SOLN
Freq: Once | INTRAVENOUS | Status: AC
  Filled 2020-07-06: qty 250

## 2020-07-06 MED ORDER — SODIUM CHLORIDE 0.9 % IV SOLN
10.0000 mg | Freq: Once | INTRAVENOUS | Status: AC
  Administered 2020-07-06: 10 mg via INTRAVENOUS
  Filled 2020-07-06: qty 10

## 2020-07-06 MED ORDER — SODIUM CHLORIDE 0.9 % IV SOLN
0.7500 mg/kg | Freq: Once | INTRAVENOUS | Status: AC
  Administered 2020-07-06: 56 mg via INTRAVENOUS
  Filled 2020-07-06: qty 5.6

## 2020-07-06 MED ORDER — HEPARIN SOD (PORK) LOCK FLUSH 100 UNIT/ML IV SOLN
500.0000 [IU] | Freq: Once | INTRAVENOUS | Status: AC | PRN
  Administered 2020-07-06: 500 [IU]
  Filled 2020-07-06: qty 5

## 2020-07-06 MED ORDER — SODIUM CHLORIDE 0.9% FLUSH
10.0000 mL | Freq: Once | INTRAVENOUS | Status: AC
  Administered 2020-07-06: 10 mL
  Filled 2020-07-06: qty 10

## 2020-07-06 MED ORDER — PROCHLORPERAZINE MALEATE 10 MG PO TABS
ORAL_TABLET | ORAL | Status: AC
  Filled 2020-07-06: qty 1

## 2020-07-06 NOTE — Progress Notes (Signed)
Hematology and Oncology Follow Up Visit  Danny Wood [id number] Jan 22, 1951 69 y.o. 07/06/2020 9:49 AM Danny Wood, Danny Brow, MD   Principle Diagnosis: 69 year old man with stage IV high-grade urothelial carcinoma of the bladder diagnosed in 2015.     Prior Therapy:  He underwent a cystoscopy and a TURBT on 05/17/2014.   Neoadjuvant systemic chemotherapy in the form of gemcitabine and cisplatin cycle 1 day 1 is on 06/17/2014. He is S/P two cycles completed in 07/2014.  Therapy tolerated poorly with symptoms of nausea and vomiting and worsening renal function.  He is status post robotic cystoprostatectomy and bilateral lymphadenectomy completed on September 09, 2014.  The final pathology revealed T2N0 without any lymph node involvement.  He developed pelvic adenopathy that was biopsy-proven on Jan 14, 2018 to be metastatic urothelial carcinoma.  Carboplatin and gemcitabine cycle 1 started on 02/17/2018.  He completed 8 cycles of therapy in December 2019.  Pembrolizumab 200 mg every 3 weeks started on April 01, 2019.  He is status post 7 cycles of therapy in December 2020.  Therapy stopped because of of patient preference, excellent clinical response and treatment holiday.  He developed progression of disease in June 2021.   Current therapy: Padcev started on February 11, 2020.  He is currently receiving it at  0.75 mg/kg on day 1 and day 15 of each cycle.  He is here for day 1 of cycle 6 of therapy.  Interim History: [name] returns today for repeat evaluation. Since the last visit, he reports no major changes in his health.  He has tolerated Padcev without any complaints.  He denies any nausea, vomiting or abdominal pain.  He denies any worsening neuropathy or excessive fatigue.  His appetite has been marginal but weight is stable.  Continues to have pain in his right thigh and knee which has been chronic in nature.  He still ambulates and able to  drive.                 Medications: Updated on review. Current Outpatient Medications  Medication Sig Dispense Refill  . diphenoxylate-atropine (LOMOTIL) 2.5-0.025 MG tablet 1 to 2 PO QID prn diarrhea (Patient taking differently: Take 1-2 tablets by mouth 4 (four) times daily as needed for diarrhea or loose stools. ) 45 tablet 2  . Insulin Glargine (BASAGLAR KWIKPEN) 100 UNIT/ML Inject 8 Units into the skin every morning. 9 mL 2  . insulin lispro (HUMALOG KWIKPEN) 100 UNIT/ML KwikPen Inject 2 Units into the skin daily with supper. 1.8 mL 3  . oxyCODONE-acetaminophen (PERCOCET) 10-325 MG tablet Take 1 tablet by mouth every 4 (four) hours as needed for pain. 60 tablet 0  . prochlorperazine (COMPAZINE) 10 MG tablet TAKE 1 TABLET(10 MG) BY MOUTH EVERY 6 HOURS AS NEEDED FOR NAUSEA OR VOMITING 30 tablet 0  . sertraline (ZOLOFT) 25 MG tablet TAKE 1 TABLET(25 MG) BY MOUTH DAILY 30 tablet 1   No current facility-administered medications for this visit.     Allergies: No Known Allergies    Physical Exam:      Blood pressure 127/73, pulse 65, temperature 97.8 F (36.6 C), temperature source Oral, resp. rate 18, height 6\' 2"  (1.88 m), weight 172 lb 12.8 oz (78.4 kg), SpO2 100 %.        ECOG: 1   General appearance: Comfortable appearing without any discomfort Head: Normocephalic without any trauma Oropharynx: Mucous membranes are moist and pink without any thrush or ulcers. Eyes: Pupils are equal and round  reactive to light. Lymph nodes: No cervical, supraclavicular, inguinal or axillary lymphadenopathy.   Heart:regular rate and rhythm.  S1 and S2 without leg edema. Lung: Clear without any rhonchi or wheezes.  No dullness to percussion. Abdomin: Soft, nontender, nondistended with good bowel sounds.  No hepatosplenomegaly. Musculoskeletal: No joint deformity or effusion.  Full range of motion noted. Neurological: No deficits noted on motor, sensory and deep tendon  reflex exam. Skin: No petechial rash or dryness.  Appeared moist.  Psychiatric: Mood and affect appeared appropriate.             .   Lab Results: Lab Results  Component Value Date   WBC 5.8 06/22/2020   HGB 12.6 (L) 06/22/2020   HCT 36.6 (L) 06/22/2020   MCV 89.9 06/22/2020   PLT 180 06/22/2020     Chemistry         Impression and Plan:  69 year old man with:    1. Stage IV high-grade urothelial carcinoma of the bladder documented since 2019.  He has tolerated Padcev with excellent tolerance as well as improvement in his quality of life. Risks and benefits of continuing this treatment beyond the cycle were reviewed. Complications include hyperglycemia and neuropathy were reiterated.  He is agreeable to proceed and will repeat imaging studies before the next cycle of therapy.  2.  IV access: Port-A-Cath currently in use without any complications.  3. Nutritional issues: Continues to improve and gain weight since last visit.  4.  Pain: Currently manageable with Percocet without any recent exacerbation. His pain is related to malignancy.   5.  Prognosis: His disease is incurable although aggressive measures are warranted given his excellent formal status currently.  6.  Thigh pain and swelling: Remains unclear at this time I will include imaging studies of the thigh for further determination.  7. Followup: In 2 weeks to complete the cycle of therapy and in 4 weeks for the next cycle start.  30  minutes were spent on this visit. The time was dedicated to reviewing his disease status, discussing treatment options and outlining future plan of care.    [name], MD 11/4/20219:49 AM

## 2020-07-06 NOTE — Patient Instructions (Signed)
[facility] Discharge Instructions for Patients Receiving Chemotherapy  Today you received the following chemotherapy agents: Padcev   To help prevent nausea and vomiting after your treatment, we encourage you to take your nausea medication as directed.    If you develop nausea and vomiting that is not controlled by your nausea medication, call the clinic.   BELOW ARE SYMPTOMS THAT SHOULD BE REPORTED IMMEDIATELY:  *FEVER GREATER THAN 100.5 F  *CHILLS WITH OR WITHOUT FEVER  NAUSEA AND VOMITING THAT IS NOT CONTROLLED WITH YOUR NAUSEA MEDICATION  *UNUSUAL SHORTNESS OF BREATH  *UNUSUAL BRUISING OR BLEEDING  TENDERNESS IN MOUTH AND THROAT WITH OR WITHOUT PRESENCE OF ULCERS  *URINARY PROBLEMS  *BOWEL PROBLEMS  UNUSUAL RASH Items with * indicate a potential emergency and should be followed up as soon as possible.  Feel free to call the clinic should you have any questions or concerns. The clinic phone number is [phone].  Please show the CHEMO ALERT CARD at check-in to the Emergency Department and triage nurse.   

## 2020-07-07 ENCOUNTER — Other Ambulatory Visit: Payer: Self-pay | Admitting: Oncology

## 2020-07-07 MED ORDER — OXYCODONE-ACETAMINOPHEN 10-325 MG PO TABS: 1.0000 | ORAL_TABLET | ORAL | 0 refills | Status: DC | PRN

## 2020-07-20 ENCOUNTER — Other Ambulatory Visit: Payer: Self-pay

## 2020-07-20 ENCOUNTER — Inpatient Hospital Stay: Payer: Medicare Other

## 2020-07-20 VITALS — HR 79 | Temp 97.9°F | Resp 20 | Wt 178.5 lb

## 2020-07-20 DIAGNOSIS — Z5112 Encounter for antineoplastic immunotherapy: Secondary | ICD-10-CM | POA: Diagnosis not present

## 2020-07-20 DIAGNOSIS — C679 Malignant neoplasm of bladder, unspecified: Secondary | ICD-10-CM

## 2020-07-20 DIAGNOSIS — Z95828 Presence of other vascular implants and grafts: Secondary | ICD-10-CM

## 2020-07-20 LAB — CBC WITH DIFFERENTIAL (CANCER CENTER ONLY)
Abs Immature Granulocytes: 0.02 10*3/uL (ref 0.00–0.07)
Basophils Absolute: 0 10*3/uL (ref 0.0–0.1)
Basophils Relative: 0 %
Eosinophils Absolute: 0.1 10*3/uL (ref 0.0–0.5)
Eosinophils Relative: 1 %
HCT: 39.3 % (ref 39.0–52.0)
Hemoglobin: 14.1 g/dL (ref 13.0–17.0)
Immature Granulocytes: 0 %
Lymphocytes Relative: 24 %
Lymphs Abs: 1.7 10*3/uL (ref 0.7–4.0)
MCH: 31.3 pg (ref 26.0–34.0)
MCHC: 35.9 g/dL (ref 30.0–36.0)
MCV: 87.3 fL (ref 80.0–100.0)
Monocytes Absolute: 0.6 10*3/uL (ref 0.1–1.0)
Monocytes Relative: 8 %
Neutro Abs: 4.6 10*3/uL (ref 1.7–7.7)
Neutrophils Relative %: 67 %
Platelet Count: 173 10*3/uL (ref 150–400)
RBC: 4.5 MIL/uL (ref 4.22–5.81)
RDW: 13.3 % (ref 11.5–15.5)
WBC Count: 7 10*3/uL (ref 4.0–10.5)
nRBC: 0 % (ref 0.0–0.2)

## 2020-07-20 LAB — CMP (CANCER CENTER ONLY)
ALT: 32 U/L (ref 0–44)
AST: 27 U/L (ref 15–41)
Albumin: 3.4 g/dL — ABNORMAL LOW (ref 3.5–5.0)
Alkaline Phosphatase: 73 U/L (ref 38–126)
Anion gap: 8 (ref 5–15)
BUN: 16 mg/dL (ref 8–23)
CO2: 26 mmol/L (ref 22–32)
Calcium: 8.4 mg/dL — ABNORMAL LOW (ref 8.9–10.3)
Chloride: 105 mmol/L (ref 98–111)
Creatinine: 0.96 mg/dL (ref 0.61–1.24)
GFR, Estimated: >60 mL/min (ref >60)
Glucose, Bld: 238 mg/dL — ABNORMAL HIGH (ref 70–99)
Potassium: 4.2 mmol/L (ref 3.5–5.1)
Sodium: 139 mmol/L (ref 135–145)
Total Bilirubin: 0.4 mg/dL (ref 0.3–1.2)
Total Protein: 6.2 g/dL — ABNORMAL LOW (ref 6.5–8.1)

## 2020-07-20 MED ORDER — SODIUM CHLORIDE 0.9% FLUSH
10.0000 mL | Freq: Once | INTRAVENOUS | Status: AC
  Administered 2020-07-20: 10 mL
  Filled 2020-07-20: qty 10

## 2020-07-20 MED ORDER — HEPARIN SOD (PORK) LOCK FLUSH 100 UNIT/ML IV SOLN
500.0000 [IU] | Freq: Once | INTRAVENOUS | Status: AC | PRN
  Administered 2020-07-20: 500 [IU]
  Filled 2020-07-20: qty 5

## 2020-07-20 MED ORDER — SODIUM CHLORIDE 0.9 % IV SOLN
10.0000 mg | Freq: Once | INTRAVENOUS | Status: AC
  Administered 2020-07-20: 10 mg via INTRAVENOUS
  Filled 2020-07-20: qty 10

## 2020-07-20 MED ORDER — PROCHLORPERAZINE MALEATE 10 MG PO TABS
ORAL_TABLET | ORAL | Status: AC
  Filled 2020-07-20: qty 1

## 2020-07-20 MED ORDER — SODIUM CHLORIDE 0.9 % IV SOLN
Freq: Once | INTRAVENOUS | Status: AC
  Filled 2020-07-20: qty 250

## 2020-07-20 MED ORDER — SODIUM CHLORIDE 0.9 % IV SOLN
0.7500 mg/kg | Freq: Once | INTRAVENOUS | Status: AC
  Administered 2020-07-20: 56 mg via INTRAVENOUS
  Filled 2020-07-20: qty 5.6

## 2020-07-20 MED ORDER — SODIUM CHLORIDE 0.9% FLUSH
10.0000 mL | INTRAVENOUS | Status: DC | PRN
  Administered 2020-07-20: 10 mL
  Filled 2020-07-20: qty 10

## 2020-07-20 MED ORDER — PROCHLORPERAZINE MALEATE 10 MG PO TABS
10.0000 mg | ORAL_TABLET | Freq: Once | ORAL | Status: AC
  Administered 2020-07-20: 10 mg via ORAL

## 2020-07-20 NOTE — Progress Notes (Signed)
Pt. denies occular changes and glucose 238.

## 2020-07-20 NOTE — Patient Instructions (Signed)
[facility] Discharge Instructions for Patients Receiving Chemotherapy  Today you received the following chemotherapy agent: Enfortumab-Vedotin (PADCEV)  To help prevent nausea and vomiting after your treatment, we encourage you to take your nausea medication as directed by your MD.   If you develop nausea and vomiting that is not controlled by your nausea medication, call the clinic.   BELOW ARE SYMPTOMS THAT SHOULD BE REPORTED IMMEDIATELY:  *FEVER GREATER THAN 100.5 F  *CHILLS WITH OR WITHOUT FEVER  NAUSEA AND VOMITING THAT IS NOT CONTROLLED WITH YOUR NAUSEA MEDICATION  *UNUSUAL SHORTNESS OF BREATH  *UNUSUAL BRUISING OR BLEEDING  TENDERNESS IN MOUTH AND THROAT WITH OR WITHOUT PRESENCE OF ULCERS  *URINARY PROBLEMS  *BOWEL PROBLEMS  UNUSUAL RASH Items with * indicate a potential emergency and should be followed up as soon as possible.  Feel free to call the clinic should you have any questions or concerns. The clinic phone number is [phone].  Please show the [facility] at check-in to the Emergency Department and triage nurse.

## 2020-07-21 ENCOUNTER — Other Ambulatory Visit: Payer: Self-pay | Admitting: Oncology

## 2020-07-21 ENCOUNTER — Other Ambulatory Visit: Payer: Self-pay | Admitting: *Deleted

## 2020-07-21 DIAGNOSIS — M79604 Pain in right leg: Secondary | ICD-10-CM

## 2020-07-21 DIAGNOSIS — C679 Malignant neoplasm of bladder, unspecified: Secondary | ICD-10-CM

## 2020-07-21 NOTE — Progress Notes (Signed)
Message to Dr Alen Blew. CT for right LE needs to be more specific

## 2020-07-31 ENCOUNTER — Telehealth: Payer: Self-pay | Admitting: *Deleted

## 2020-07-31 NOTE — Telephone Encounter (Signed)
Patient called requesting refill of oxycodone.  Routed to Dr. Alen Blew.

## 2020-07-31 NOTE — Telephone Encounter (Signed)
Patient called about current appointment schedule.  Per last LOS patient is supposed to come back 08/31/2020 however patient has appointments scheduled before then.  Please review and advise if all previous appointments need to be canceled and if last LOS from 07/06/2020 is the latest and current request.  Routed to Dr Alen Blew.

## 2020-08-01 ENCOUNTER — Other Ambulatory Visit: Payer: Self-pay | Admitting: Oncology

## 2020-08-01 MED ORDER — OXYCODONE-ACETAMINOPHEN 10-325 MG PO TABS: 1.0000 | ORAL_TABLET | ORAL | 0 refills | Status: DC | PRN

## 2020-08-01 NOTE — Telephone Encounter (Signed)
Rx sent 

## 2020-08-01 NOTE — Telephone Encounter (Signed)
We need to cancel 12/2 appointments. He will need treatment on 12/16 and follow up on  12/30 already scheduled.

## 2020-08-03 ENCOUNTER — Inpatient Hospital Stay: Payer: Medicare Other

## 2020-08-03 ENCOUNTER — Inpatient Hospital Stay: Payer: Medicare Other | Admitting: Oncology

## 2020-08-07 ENCOUNTER — Ambulatory Visit (HOSPITAL_COMMUNITY)
Admission: RE | Admit: 2020-08-07 | Discharge: 2020-08-07 | Disposition: A | Discharge status: Home / Self Care | Payer: Medicare Other | Source: Ambulatory Visit | Attending: Oncology | Admitting: Oncology

## 2020-08-07 ENCOUNTER — Other Ambulatory Visit: Payer: Self-pay

## 2020-08-07 DIAGNOSIS — C679 Malignant neoplasm of bladder, unspecified: Secondary | ICD-10-CM

## 2020-08-07 DIAGNOSIS — M79604 Pain in right leg: Secondary | ICD-10-CM | POA: Diagnosis present

## 2020-08-07 MED ORDER — IOHEXOL 300 MG/ML  SOLN
100.0000 mL | Freq: Once | INTRAMUSCULAR | Status: AC | PRN
  Administered 2020-08-07: 100 mL via INTRAVENOUS

## 2020-08-07 MED ORDER — HEPARIN SOD (PORK) LOCK FLUSH 100 UNIT/ML IV SOLN: 500.0000 [IU] | Freq: Once | INTRAVENOUS | Status: AC

## 2020-08-07 MED ORDER — HEPARIN SOD (PORK) LOCK FLUSH 100 UNIT/ML IV SOLN
INTRAVENOUS | Status: AC
  Administered 2020-08-07: 500 [IU] via INTRAVENOUS
  Filled 2020-08-07: qty 5

## 2020-08-07 MED ORDER — SODIUM CHLORIDE 0.9 % IV SOLN
INTRAVENOUS | Status: AC
  Filled 2020-08-07: qty 1000

## 2020-08-15 ENCOUNTER — Telehealth: Payer: Self-pay | Admitting: Oncology

## 2020-08-15 NOTE — Telephone Encounter (Signed)
Scheduled per los, patient has been called and notified of upcoming appointments. 

## 2020-08-17 ENCOUNTER — Other Ambulatory Visit: Payer: Self-pay

## 2020-08-17 ENCOUNTER — Inpatient Hospital Stay: Payer: Medicare Other

## 2020-08-17 ENCOUNTER — Inpatient Hospital Stay: Payer: Medicare Other | Attending: Oncology

## 2020-08-17 ENCOUNTER — Other Ambulatory Visit: Payer: Self-pay | Admitting: Oncology

## 2020-08-17 VITALS — BP 130/68 | HR 62 | Temp 98.0°F | Resp 18 | Ht 74.0 in | Wt 176.0 lb

## 2020-08-17 DIAGNOSIS — Z9079 Acquired absence of other genital organ(s): Secondary | ICD-10-CM | POA: Diagnosis not present

## 2020-08-17 DIAGNOSIS — Z79899 Other long term (current) drug therapy: Secondary | ICD-10-CM | POA: Insufficient documentation

## 2020-08-17 DIAGNOSIS — C679 Malignant neoplasm of bladder, unspecified: Secondary | ICD-10-CM

## 2020-08-17 DIAGNOSIS — R112 Nausea with vomiting, unspecified: Secondary | ICD-10-CM | POA: Insufficient documentation

## 2020-08-17 DIAGNOSIS — G893 Neoplasm related pain (acute) (chronic): Secondary | ICD-10-CM | POA: Insufficient documentation

## 2020-08-17 DIAGNOSIS — M25559 Pain in unspecified hip: Secondary | ICD-10-CM | POA: Diagnosis not present

## 2020-08-17 DIAGNOSIS — C779 Secondary and unspecified malignant neoplasm of lymph node, unspecified: Secondary | ICD-10-CM | POA: Insufficient documentation

## 2020-08-17 DIAGNOSIS — R918 Other nonspecific abnormal finding of lung field: Secondary | ICD-10-CM | POA: Insufficient documentation

## 2020-08-17 DIAGNOSIS — R109 Unspecified abdominal pain: Secondary | ICD-10-CM | POA: Insufficient documentation

## 2020-08-17 DIAGNOSIS — Z5112 Encounter for antineoplastic immunotherapy: Secondary | ICD-10-CM | POA: Insufficient documentation

## 2020-08-17 DIAGNOSIS — Z95828 Presence of other vascular implants and grafts: Secondary | ICD-10-CM

## 2020-08-17 LAB — CBC WITH DIFFERENTIAL (CANCER CENTER ONLY)
Abs Immature Granulocytes: 0.01 10*3/uL (ref 0.00–0.07)
Basophils Absolute: 0 10*3/uL (ref 0.0–0.1)
Basophils Relative: 1 %
Eosinophils Absolute: 0.1 10*3/uL (ref 0.0–0.5)
Eosinophils Relative: 1 %
HCT: 39 % (ref 39.0–52.0)
Hemoglobin: 13.9 g/dL (ref 13.0–17.0)
Immature Granulocytes: 0 %
Lymphocytes Relative: 25 %
Lymphs Abs: 1.5 10*3/uL (ref 0.7–4.0)
MCH: 30.8 pg (ref 26.0–34.0)
MCHC: 35.6 g/dL (ref 30.0–36.0)
MCV: 86.5 fL (ref 80.0–100.0)
Monocytes Absolute: 0.6 10*3/uL (ref 0.1–1.0)
Monocytes Relative: 10 %
Neutro Abs: 3.6 10*3/uL (ref 1.7–7.7)
Neutrophils Relative %: 63 %
Platelet Count: 182 10*3/uL (ref 150–400)
RBC: 4.51 MIL/uL (ref 4.22–5.81)
RDW: 13 % (ref 11.5–15.5)
WBC Count: 5.8 10*3/uL (ref 4.0–10.5)
nRBC: 0 % (ref 0.0–0.2)

## 2020-08-17 LAB — CMP (CANCER CENTER ONLY)
ALT: 33 U/L (ref 0–44)
AST: 33 U/L (ref 15–41)
Albumin: 3.5 g/dL (ref 3.5–5.0)
Alkaline Phosphatase: 63 U/L (ref 38–126)
Anion gap: 6 (ref 5–15)
BUN: 19 mg/dL (ref 8–23)
CO2: 27 mmol/L (ref 22–32)
Calcium: 8.6 mg/dL — ABNORMAL LOW (ref 8.9–10.3)
Chloride: 105 mmol/L (ref 98–111)
Creatinine: 1.02 mg/dL (ref 0.61–1.24)
GFR, Estimated: >60 mL/min (ref >60)
Glucose, Bld: 183 mg/dL — ABNORMAL HIGH (ref 70–99)
Potassium: 4.1 mmol/L (ref 3.5–5.1)
Sodium: 138 mmol/L (ref 135–145)
Total Bilirubin: 0.6 mg/dL (ref 0.3–1.2)
Total Protein: 6.4 g/dL — ABNORMAL LOW (ref 6.5–8.1)

## 2020-08-17 MED ORDER — OXYCODONE-ACETAMINOPHEN 10-325 MG PO TABS: 1.0000 | ORAL_TABLET | ORAL | 0 refills | Status: DC | PRN

## 2020-08-17 MED ORDER — SODIUM CHLORIDE 0.9 % IV SOLN
0.7500 mg/kg | Freq: Once | INTRAVENOUS | Status: AC
  Administered 2020-08-17: 56 mg via INTRAVENOUS
  Filled 2020-08-17: qty 5.6

## 2020-08-17 MED ORDER — PROCHLORPERAZINE MALEATE 10 MG PO TABS
10.0000 mg | ORAL_TABLET | Freq: Once | ORAL | Status: AC
  Administered 2020-08-17: 10 mg via ORAL

## 2020-08-17 MED ORDER — SODIUM CHLORIDE 0.9% FLUSH
10.0000 mL | INTRAVENOUS | Status: DC | PRN
  Administered 2020-08-17: 10 mL
  Filled 2020-08-17: qty 10

## 2020-08-17 MED ORDER — SODIUM CHLORIDE 0.9% FLUSH
10.0000 mL | Freq: Once | INTRAVENOUS | Status: AC
  Administered 2020-08-17: 10 mL
  Filled 2020-08-17: qty 10

## 2020-08-17 MED ORDER — SODIUM CHLORIDE 0.9 % IV SOLN
Freq: Once | INTRAVENOUS | Status: AC
  Filled 2020-08-17: qty 250

## 2020-08-17 MED ORDER — SODIUM CHLORIDE 0.9 % IV SOLN
10.0000 mg | Freq: Once | INTRAVENOUS | Status: AC
  Administered 2020-08-17: 10 mg via INTRAVENOUS
  Filled 2020-08-17: qty 10

## 2020-08-17 MED ORDER — PROCHLORPERAZINE MALEATE 10 MG PO TABS
ORAL_TABLET | ORAL | Status: AC
  Filled 2020-08-17: qty 1

## 2020-08-17 MED ORDER — HEPARIN SOD (PORK) LOCK FLUSH 100 UNIT/ML IV SOLN
500.0000 [IU] | Freq: Once | INTRAVENOUS | Status: AC | PRN
  Administered 2020-08-17: 500 [IU]
  Filled 2020-08-17: qty 5

## 2020-08-17 NOTE — Patient Instructions (Signed)
[facility] Discharge Instructions for Patients Receiving Chemotherapy  Today you received the following chemotherapy agents Enfortumab vedotin-ejfv (PADCEV).  To help prevent nausea and vomiting after your treatment, we encourage you to take your nausea medication as prescribed.   If you develop nausea and vomiting that is not controlled by your nausea medication, call the clinic.   BELOW ARE SYMPTOMS THAT SHOULD BE REPORTED IMMEDIATELY:  *FEVER GREATER THAN 100.5 F  *CHILLS WITH OR WITHOUT FEVER  NAUSEA AND VOMITING THAT IS NOT CONTROLLED WITH YOUR NAUSEA MEDICATION  *UNUSUAL SHORTNESS OF BREATH  *UNUSUAL BRUISING OR BLEEDING  TENDERNESS IN MOUTH AND THROAT WITH OR WITHOUT PRESENCE OF ULCERS  *URINARY PROBLEMS  *BOWEL PROBLEMS  UNUSUAL RASH Items with * indicate a potential emergency and should be followed up as soon as possible.  Feel free to call the clinic should you have any questions or concerns. The clinic phone number is [phone].  Please show the [facility] at check-in to the Emergency Department and triage nurse.

## 2020-08-31 ENCOUNTER — Inpatient Hospital Stay: Payer: Medicare Other

## 2020-08-31 ENCOUNTER — Inpatient Hospital Stay (HOSPITAL_BASED_OUTPATIENT_CLINIC_OR_DEPARTMENT_OTHER): Payer: Medicare Other | Admitting: Oncology

## 2020-08-31 ENCOUNTER — Other Ambulatory Visit: Payer: Self-pay

## 2020-08-31 ENCOUNTER — Telehealth: Payer: Self-pay | Admitting: Oncology

## 2020-08-31 VITALS — BP 147/72 | HR 70 | Temp 98.1°F | Resp 18 | Ht 74.0 in | Wt 177.3 lb

## 2020-08-31 DIAGNOSIS — Z95828 Presence of other vascular implants and grafts: Secondary | ICD-10-CM

## 2020-08-31 DIAGNOSIS — C679 Malignant neoplasm of bladder, unspecified: Secondary | ICD-10-CM | POA: Diagnosis not present

## 2020-08-31 DIAGNOSIS — Z5112 Encounter for antineoplastic immunotherapy: Secondary | ICD-10-CM | POA: Diagnosis not present

## 2020-08-31 LAB — CMP (CANCER CENTER ONLY)
ALT: 37 U/L (ref 0–44)
AST: 35 U/L (ref 15–41)
Albumin: 3.5 g/dL (ref 3.5–5.0)
Alkaline Phosphatase: 56 U/L (ref 38–126)
Anion gap: 4 — ABNORMAL LOW (ref 5–15)
BUN: 20 mg/dL (ref 8–23)
CO2: 28 mmol/L (ref 22–32)
Calcium: 8.6 mg/dL — ABNORMAL LOW (ref 8.9–10.3)
Chloride: 106 mmol/L (ref 98–111)
Creatinine: 0.98 mg/dL (ref 0.61–1.24)
GFR, Estimated: >60 mL/min (ref >60)
Glucose, Bld: 241 mg/dL — ABNORMAL HIGH (ref 70–99)
Potassium: 4.3 mmol/L (ref 3.5–5.1)
Sodium: 138 mmol/L (ref 135–145)
Total Bilirubin: 0.5 mg/dL (ref 0.3–1.2)
Total Protein: 6.3 g/dL — ABNORMAL LOW (ref 6.5–8.1)

## 2020-08-31 LAB — CBC WITH DIFFERENTIAL (CANCER CENTER ONLY)
Abs Immature Granulocytes: 0.01 10*3/uL (ref 0.00–0.07)
Basophils Absolute: 0 10*3/uL (ref 0.0–0.1)
Basophils Relative: 1 %
Eosinophils Absolute: 0.1 10*3/uL (ref 0.0–0.5)
Eosinophils Relative: 1 %
HCT: 39 % (ref 39.0–52.0)
Hemoglobin: 14 g/dL (ref 13.0–17.0)
Immature Granulocytes: 0 %
Lymphocytes Relative: 30 %
Lymphs Abs: 1.9 10*3/uL (ref 0.7–4.0)
MCH: 31.1 pg (ref 26.0–34.0)
MCHC: 35.9 g/dL (ref 30.0–36.0)
MCV: 86.7 fL (ref 80.0–100.0)
Monocytes Absolute: 0.5 10*3/uL (ref 0.1–1.0)
Monocytes Relative: 8 %
Neutro Abs: 3.8 10*3/uL (ref 1.7–7.7)
Neutrophils Relative %: 60 %
Platelet Count: 196 10*3/uL (ref 150–400)
RBC: 4.5 MIL/uL (ref 4.22–5.81)
RDW: 13 % (ref 11.5–15.5)
WBC Count: 6.4 10*3/uL (ref 4.0–10.5)
nRBC: 0 % (ref 0.0–0.2)

## 2020-08-31 MED ORDER — SODIUM CHLORIDE 0.9 % IV SOLN
0.7500 mg/kg | Freq: Once | INTRAVENOUS | Status: AC
  Administered 2020-08-31: 56 mg via INTRAVENOUS
  Filled 2020-08-31: qty 5.6

## 2020-08-31 MED ORDER — SODIUM CHLORIDE 0.9 % IV SOLN
10.0000 mg | Freq: Once | INTRAVENOUS | Status: AC
  Administered 2020-08-31: 10 mg via INTRAVENOUS
  Filled 2020-08-31: qty 10

## 2020-08-31 MED ORDER — PROCHLORPERAZINE MALEATE 10 MG PO TABS
ORAL_TABLET | ORAL | Status: AC
  Filled 2020-08-31: qty 1

## 2020-08-31 MED ORDER — SODIUM CHLORIDE 0.9 % IV SOLN
Freq: Once | INTRAVENOUS | Status: AC
  Filled 2020-08-31: qty 250

## 2020-08-31 MED ORDER — HEPARIN SOD (PORK) LOCK FLUSH 100 UNIT/ML IV SOLN
500.0000 [IU] | Freq: Once | INTRAVENOUS | Status: AC | PRN
  Administered 2020-08-31: 500 [IU]
  Filled 2020-08-31: qty 5

## 2020-08-31 MED ORDER — PROCHLORPERAZINE MALEATE 10 MG PO TABS
10.0000 mg | ORAL_TABLET | Freq: Once | ORAL | Status: AC
  Administered 2020-08-31: 10 mg via ORAL

## 2020-08-31 MED ORDER — SODIUM CHLORIDE 0.9% FLUSH
10.0000 mL | INTRAVENOUS | Status: DC | PRN
  Administered 2020-08-31: 10 mL
  Filled 2020-08-31: qty 10

## 2020-08-31 NOTE — Telephone Encounter (Signed)
Scheduled next 2 rounds of infusions per 12/30 los. Patient is aware and stated he might have to call Monday regarding possibly rescheduling the appointment the second week of January.

## 2020-08-31 NOTE — Patient Instructions (Signed)
[facility] Discharge Instructions for Patients Receiving Chemotherapy  Today you received the following chemotherapy agents Padcev  To help prevent nausea and vomiting after your treatment, we encourage you to take your nausea medication  As directed  If you develop nausea and vomiting that is not controlled by your nausea medication, call the clinic.   BELOW ARE SYMPTOMS THAT SHOULD BE REPORTED IMMEDIATELY:  *FEVER GREATER THAN 100.5 F  *CHILLS WITH OR WITHOUT FEVER  NAUSEA AND VOMITING THAT IS NOT CONTROLLED WITH YOUR NAUSEA MEDICATION  *UNUSUAL SHORTNESS OF BREATH  *UNUSUAL BRUISING OR BLEEDING  TENDERNESS IN MOUTH AND THROAT WITH OR WITHOUT PRESENCE OF ULCERS  *URINARY PROBLEMS  *BOWEL PROBLEMS  UNUSUAL RASH Items with * indicate a potential emergency and should be followed up as soon as possible.  Feel free to call the clinic should you have any questions or concerns. The clinic phone number is [phone].  Please show the CHEMO ALERT CARD at check-in to the Emergency Department and triage nurse.   

## 2020-08-31 NOTE — Progress Notes (Signed)
Hematology and Oncology Follow Up Visit  Danny Wood [id number] [birth year] 69 y.o. 08/31/2020 8:25 AM Danny Wood, Danny Hua, MD   Principle Diagnosis: 69 year old man with bladder cancer diagnosed in 2015.  He was found to have stage IV high-grade urothelial carcinoma with metastatic disease to lymph nodes in 2019.     Prior Therapy:  He underwent a cystoscopy and a TURBT on 05/17/2014.   Neoadjuvant systemic chemotherapy in the form of gemcitabine and cisplatin cycle 1 day 1 is on 06/17/2014. He is S/P two cycles completed in 07/2014.  Therapy tolerated poorly with symptoms of nausea and vomiting and worsening renal function.  He is status post robotic cystoprostatectomy and bilateral lymphadenectomy completed on September 09, 2014.  The final pathology revealed T2N0 without any lymph node involvement.  He developed pelvic adenopathy that was biopsy-proven on Jan 14, 2018 to be metastatic urothelial carcinoma.  Carboplatin and gemcitabine cycle 1 started on 02/17/2018.  He completed 8 cycles of therapy in December 2019.  Pembrolizumab 200 mg every 3 weeks started on April 01, 2019.  He is status post 7 cycles of therapy in December 2020.  Therapy stopped because of of patient preference, excellent clinical response and treatment holiday.  He developed progression of disease in June 2021.   Current therapy: Padcev started on February 11, 2020.  He is currently receiving it at  0.75 mg/kg on day 1 and day 15 of each cycle.  He is here for day 15 of cycle 7 of therapy.  Interim History: Danny Wood presents today for repeat follow-up.  Since the last visit, he reports feeling well without any major complaints. Continues to have issues with his right thigh with mild edema and discomfort associated with it. Denies any bone pain or pathological fractures. He is ambulating better and has gained more weight. He denies any nausea, vomiting or worsening neuropathy. He denies any  hypoglycemia episodes. His performance status and quality of life remained stable.                 Medications: Unchanged on review. Current Outpatient Medications  Medication Sig Dispense Refill  . diphenoxylate-atropine (LOMOTIL) 2.5-0.025 MG tablet 1 to 2 PO QID prn diarrhea (Patient taking differently: Take 1-2 tablets by mouth 4 (four) times daily as needed for diarrhea or loose stools. ) 45 tablet 2  . Insulin Glargine (BASAGLAR KWIKPEN) 100 UNIT/ML Inject 8 Units into the skin every morning. 9 mL 2  . insulin lispro (HUMALOG KWIKPEN) 100 UNIT/ML KwikPen Inject 2 Units into the skin daily with supper. 1.8 mL 3  . oxyCODONE-acetaminophen (PERCOCET) 10-325 MG tablet Take 1 tablet by mouth every 4 (four) hours as needed for pain. 60 tablet 0  . prochlorperazine (COMPAZINE) 10 MG tablet TAKE 1 TABLET(10 MG) BY MOUTH EVERY 6 HOURS AS NEEDED FOR NAUSEA OR VOMITING 30 tablet 0  . sertraline (ZOLOFT) 25 MG tablet TAKE 1 TABLET(25 MG) BY MOUTH DAILY 30 tablet 1   No current facility-administered medications for this visit.     Allergies: No Known Allergies    Physical Exam:        Blood pressure (!) 147/72, pulse 70, temperature 98.1 F (36.7 C), temperature source Tympanic, resp. rate 18, height 6\' 2"  (1.88 m), weight 177 lb 4.8 oz (80.4 kg), SpO2 97 %.       ECOG: 1    General appearance: Alert, awake without any distress. Head: Atraumatic without abnormalities Oropharynx: Without any thrush or ulcers. Eyes: No scleral  icterus. Lymph nodes: No lymphadenopathy noted in the cervical, supraclavicular, or axillary nodes Heart:regular rate and rhythm, without any murmurs or gallops.   Lung: Clear to auscultation without any rhonchi, wheezes or dullness to percussion. Abdomin: Soft, nontender without any shifting dullness or ascites. Musculoskeletal: No clubbing or cyanosis. Neurological: No motor or sensory deficits. Skin: No rashes or  lesions.             .   Lab Results: Lab Results  Component Value Date   WBC 5.8 08/17/2020   HGB 13.9 08/17/2020   HCT 39.0 08/17/2020   MCV 86.5 08/17/2020   PLT 182 08/17/2020     Chemistry      IMPRESSION: Status post cystoprostatectomy with right mid abdominal ileal conduit.  Prior retroperitoneal/pelvic lymphadenopathy has essentially resolved. Mild residual ill-defined soft tissue along the right celiac axis, as above.  Stable small nodules at the lung apices favor pleural-parenchymal scarring, unchanged.  IMPRESSION: 1. Diffuse circumferential soft tissue swelling of the distal thigh and lower leg, nonspecific, but can be seen with cellulitis, venous insufficiency, or third spacing. 2. No acute osseous abnormality or focal bone lesion. 3. Healed distal tibia and proximal fibular fractures.   Impression and Plan:  69 year old man with:    1.  Bladder cancer diagnosed in 2015.  He developed stage IV high-grade urothelial carcinoma with lymphadenopathy in 2019.  The natural course of his disease was reviewed and treatment options were discussed.  He is currently on Padcev which he has tolerated very well with excellent response.  CT scan obtained on 08/07/2020 was personally reviewed and discussed with the patient which showed a positive response to therapy.  Risks and benefits of continuing this chemotherapy for the time being were reviewed.  Alternative treatment options would include supportive care,FGFR targeted therapy if he harbors appropriate mutation.  Given his excellent clinical benefit and radiographic response, I recommended continuing with the same treatment. He is agreeable to continue.  2.  IV access: Port-A-Cath remains in place without any issues.  3. Nutritional issues: He continues to eat better and gained weight.  4.  Pain: Predominantly in his thigh hip and abdomen and related to malignancy.  Pain is manageable with  Percocet.   5.  Prognosis: Therapy is palliative low risk meds are warranted given his excellent performance status.  6.  Thigh pain and swelling: Imaging studies of his right lower extremity did not show acute pathology although thigh imaging was not completed.  It appears to have some's venous congestion as the predominant reason.  We will repeat imaging studies of the thigh and consideration for orthopedics versus vascular surgery consultation if this issue persists.  7. Followup: He will return in 2 weeks for the start of next cycle of therapy.  30  minutes were dedicated to this encounter.  The time was dedicated to reviewing imaging studies, discussing treatment options and complications noted therapy.    [name], MD 12/30/20218:25 AM

## 2020-09-11 ENCOUNTER — Other Ambulatory Visit: Payer: Self-pay | Admitting: Oncology

## 2020-09-11 MED ORDER — OXYCODONE-ACETAMINOPHEN 10-325 MG PO TABS: 1.0000 | ORAL_TABLET | ORAL | 0 refills | Status: DC | PRN

## 2020-09-14 ENCOUNTER — Other Ambulatory Visit: Payer: Self-pay

## 2020-09-14 ENCOUNTER — Inpatient Hospital Stay: Payer: Medicare Other | Attending: Oncology

## 2020-09-14 ENCOUNTER — Inpatient Hospital Stay: Payer: Medicare Other

## 2020-09-14 VITALS — BP 128/70 | HR 66 | Temp 97.8°F | Resp 18

## 2020-09-14 DIAGNOSIS — R109 Unspecified abdominal pain: Secondary | ICD-10-CM | POA: Diagnosis not present

## 2020-09-14 DIAGNOSIS — C778 Secondary and unspecified malignant neoplasm of lymph nodes of multiple regions: Secondary | ICD-10-CM | POA: Diagnosis not present

## 2020-09-14 DIAGNOSIS — R630 Anorexia: Secondary | ICD-10-CM | POA: Insufficient documentation

## 2020-09-14 DIAGNOSIS — C679 Malignant neoplasm of bladder, unspecified: Secondary | ICD-10-CM

## 2020-09-14 DIAGNOSIS — R112 Nausea with vomiting, unspecified: Secondary | ICD-10-CM | POA: Insufficient documentation

## 2020-09-14 DIAGNOSIS — Z95828 Presence of other vascular implants and grafts: Secondary | ICD-10-CM

## 2020-09-14 DIAGNOSIS — R634 Abnormal weight loss: Secondary | ICD-10-CM | POA: Insufficient documentation

## 2020-09-14 DIAGNOSIS — Z79899 Other long term (current) drug therapy: Secondary | ICD-10-CM | POA: Diagnosis not present

## 2020-09-14 DIAGNOSIS — Z5112 Encounter for antineoplastic immunotherapy: Secondary | ICD-10-CM | POA: Insufficient documentation

## 2020-09-14 DIAGNOSIS — M79659 Pain in unspecified thigh: Secondary | ICD-10-CM | POA: Insufficient documentation

## 2020-09-14 LAB — CMP (CANCER CENTER ONLY)
ALT: 33 U/L (ref 0–44)
AST: 26 U/L (ref 15–41)
Albumin: 3.4 g/dL — ABNORMAL LOW (ref 3.5–5.0)
Alkaline Phosphatase: 80 U/L (ref 38–126)
Anion gap: 7 (ref 5–15)
BUN: 17 mg/dL (ref 8–23)
CO2: 27 mmol/L (ref 22–32)
Calcium: 8.7 mg/dL — ABNORMAL LOW (ref 8.9–10.3)
Chloride: 105 mmol/L (ref 98–111)
Creatinine: 0.98 mg/dL (ref 0.61–1.24)
GFR, Estimated: >60 mL/min (ref >60)
Glucose, Bld: 172 mg/dL — ABNORMAL HIGH (ref 70–99)
Potassium: 4.3 mmol/L (ref 3.5–5.1)
Sodium: 139 mmol/L (ref 135–145)
Total Bilirubin: 0.4 mg/dL (ref 0.3–1.2)
Total Protein: 6.2 g/dL — ABNORMAL LOW (ref 6.5–8.1)

## 2020-09-14 LAB — CBC WITH DIFFERENTIAL (CANCER CENTER ONLY)
Abs Immature Granulocytes: 0.02 10*3/uL (ref 0.00–0.07)
Basophils Absolute: 0 10*3/uL (ref 0.0–0.1)
Basophils Relative: 1 %
Eosinophils Absolute: 0.1 10*3/uL (ref 0.0–0.5)
Eosinophils Relative: 1 %
HCT: 39.4 % (ref 39.0–52.0)
Hemoglobin: 14 g/dL (ref 13.0–17.0)
Immature Granulocytes: 0 %
Lymphocytes Relative: 27 %
Lymphs Abs: 1.8 10*3/uL (ref 0.7–4.0)
MCH: 30.9 pg (ref 26.0–34.0)
MCHC: 35.5 g/dL (ref 30.0–36.0)
MCV: 87 fL (ref 80.0–100.0)
Monocytes Absolute: 0.6 10*3/uL (ref 0.1–1.0)
Monocytes Relative: 8 %
Neutro Abs: 4.2 10*3/uL (ref 1.7–7.7)
Neutrophils Relative %: 63 %
Platelet Count: 191 10*3/uL (ref 150–400)
RBC: 4.53 MIL/uL (ref 4.22–5.81)
RDW: 13.1 % (ref 11.5–15.5)
WBC Count: 6.6 10*3/uL (ref 4.0–10.5)
nRBC: 0 % (ref 0.0–0.2)

## 2020-09-14 MED ORDER — SODIUM CHLORIDE 0.9% FLUSH
10.0000 mL | Freq: Once | INTRAVENOUS | Status: AC
  Administered 2020-09-14: 10 mL
  Filled 2020-09-14: qty 10

## 2020-09-14 MED ORDER — PROCHLORPERAZINE MALEATE 10 MG PO TABS
10.0000 mg | ORAL_TABLET | Freq: Once | ORAL | Status: AC
  Administered 2020-09-14: 10 mg via ORAL

## 2020-09-14 MED ORDER — PROCHLORPERAZINE MALEATE 10 MG PO TABS
ORAL_TABLET | ORAL | Status: AC
  Filled 2020-09-14: qty 1

## 2020-09-14 MED ORDER — HEPARIN SOD (PORK) LOCK FLUSH 100 UNIT/ML IV SOLN
500.0000 [IU] | Freq: Once | INTRAVENOUS | Status: AC | PRN
  Administered 2020-09-14: 500 [IU]
  Filled 2020-09-14: qty 5

## 2020-09-14 MED ORDER — SODIUM CHLORIDE 0.9 % IV SOLN
0.7500 mg/kg | Freq: Once | INTRAVENOUS | Status: AC
  Administered 2020-09-14: 56 mg via INTRAVENOUS
  Filled 2020-09-14: qty 5.6

## 2020-09-14 MED ORDER — SODIUM CHLORIDE 0.9 % IV SOLN
Freq: Once | INTRAVENOUS | Status: AC
  Filled 2020-09-14: qty 250

## 2020-09-14 MED ORDER — SODIUM CHLORIDE 0.9 % IV SOLN
10.0000 mg | Freq: Once | INTRAVENOUS | Status: AC
  Administered 2020-09-14: 10 mg via INTRAVENOUS
  Filled 2020-09-14: qty 10

## 2020-09-14 MED ORDER — SODIUM CHLORIDE 0.9% FLUSH
10.0000 mL | INTRAVENOUS | Status: DC | PRN
  Administered 2020-09-14: 10 mL
  Filled 2020-09-14: qty 10

## 2020-09-14 NOTE — Patient Instructions (Signed)
[facility] Discharge Instructions for Patients Receiving Chemotherapy  Today you received the following chemotherapy agents Padcev  To help prevent nausea and vomiting after your treatment, we encourage you to take your nausea medication  As directed  If you develop nausea and vomiting that is not controlled by your nausea medication, call the clinic.   BELOW ARE SYMPTOMS THAT SHOULD BE REPORTED IMMEDIATELY:  *FEVER GREATER THAN 100.5 F  *CHILLS WITH OR WITHOUT FEVER  NAUSEA AND VOMITING THAT IS NOT CONTROLLED WITH YOUR NAUSEA MEDICATION  *UNUSUAL SHORTNESS OF BREATH  *UNUSUAL BRUISING OR BLEEDING  TENDERNESS IN MOUTH AND THROAT WITH OR WITHOUT PRESENCE OF ULCERS  *URINARY PROBLEMS  *BOWEL PROBLEMS  UNUSUAL RASH Items with * indicate a potential emergency and should be followed up as soon as possible.  Feel free to call the clinic should you have any questions or concerns. The clinic phone number is [phone].  Please show the CHEMO ALERT CARD at check-in to the Emergency Department and triage nurse.   

## 2020-09-28 ENCOUNTER — Inpatient Hospital Stay: Payer: Medicare Other

## 2020-09-28 ENCOUNTER — Inpatient Hospital Stay (HOSPITAL_BASED_OUTPATIENT_CLINIC_OR_DEPARTMENT_OTHER): Payer: Medicare Other | Admitting: Oncology

## 2020-09-28 ENCOUNTER — Other Ambulatory Visit: Payer: Self-pay

## 2020-09-28 ENCOUNTER — Other Ambulatory Visit: Payer: Self-pay | Admitting: Oncology

## 2020-09-28 VITALS — BP 125/72 | HR 83 | Temp 97.7°F | Resp 18 | Wt 180.9 lb

## 2020-09-28 DIAGNOSIS — C679 Malignant neoplasm of bladder, unspecified: Secondary | ICD-10-CM

## 2020-09-28 DIAGNOSIS — Z5112 Encounter for antineoplastic immunotherapy: Secondary | ICD-10-CM | POA: Diagnosis not present

## 2020-09-28 LAB — CBC WITH DIFFERENTIAL (CANCER CENTER ONLY)
Abs Immature Granulocytes: 0.02 10*3/uL (ref 0.00–0.07)
Basophils Absolute: 0 10*3/uL (ref 0.0–0.1)
Basophils Relative: 0 %
Eosinophils Absolute: 0.1 10*3/uL (ref 0.0–0.5)
Eosinophils Relative: 1 %
HCT: 39.8 % (ref 39.0–52.0)
Hemoglobin: 14 g/dL (ref 13.0–17.0)
Immature Granulocytes: 0 %
Lymphocytes Relative: 25 %
Lymphs Abs: 1.7 10*3/uL (ref 0.7–4.0)
MCH: 30.6 pg (ref 26.0–34.0)
MCHC: 35.2 g/dL (ref 30.0–36.0)
MCV: 87.1 fL (ref 80.0–100.0)
Monocytes Absolute: 0.6 10*3/uL (ref 0.1–1.0)
Monocytes Relative: 9 %
Neutro Abs: 4.5 10*3/uL (ref 1.7–7.7)
Neutrophils Relative %: 65 %
Platelet Count: 203 10*3/uL (ref 150–400)
RBC: 4.57 MIL/uL (ref 4.22–5.81)
RDW: 13.1 % (ref 11.5–15.5)
WBC Count: 6.9 10*3/uL (ref 4.0–10.5)
nRBC: 0 % (ref 0.0–0.2)

## 2020-09-28 LAB — CMP (CANCER CENTER ONLY)
ALT: 35 U/L (ref 0–44)
AST: 31 U/L (ref 15–41)
Albumin: 3.5 g/dL (ref 3.5–5.0)
Alkaline Phosphatase: 77 U/L (ref 38–126)
Anion gap: 6 (ref 5–15)
BUN: 18 mg/dL (ref 8–23)
CO2: 27 mmol/L (ref 22–32)
Calcium: 8.6 mg/dL — ABNORMAL LOW (ref 8.9–10.3)
Chloride: 105 mmol/L (ref 98–111)
Creatinine: 1.07 mg/dL (ref 0.61–1.24)
GFR, Estimated: >60 mL/min (ref >60)
Glucose, Bld: 198 mg/dL — ABNORMAL HIGH (ref 70–99)
Potassium: 4.3 mmol/L (ref 3.5–5.1)
Sodium: 138 mmol/L (ref 135–145)
Total Bilirubin: 0.4 mg/dL (ref 0.3–1.2)
Total Protein: 6.1 g/dL — ABNORMAL LOW (ref 6.5–8.1)

## 2020-09-28 MED ORDER — SODIUM CHLORIDE 0.9 % IV SOLN
10.0000 mg | Freq: Once | INTRAVENOUS | Status: AC
  Administered 2020-09-28: 10 mg via INTRAVENOUS
  Filled 2020-09-28: qty 10

## 2020-09-28 MED ORDER — SODIUM CHLORIDE 0.9 % IV SOLN
Freq: Once | INTRAVENOUS | Status: AC
  Filled 2020-09-28: qty 250

## 2020-09-28 MED ORDER — SODIUM CHLORIDE 0.9 % IV SOLN
0.8000 mg/kg | Freq: Once | INTRAVENOUS | Status: AC
  Administered 2020-09-28: 60 mg via INTRAVENOUS
  Filled 2020-09-28: qty 6

## 2020-09-28 MED ORDER — PROCHLORPERAZINE MALEATE 10 MG PO TABS
10.0000 mg | ORAL_TABLET | Freq: Once | ORAL | Status: AC
  Administered 2020-09-28: 10 mg via ORAL

## 2020-09-28 MED ORDER — FUROSEMIDE 20 MG PO TABS: 20.0000 mg | ORAL_TABLET | Freq: Every day | ORAL | 1 refills | Status: DC

## 2020-09-28 MED ORDER — OXYCODONE-ACETAMINOPHEN 10-325 MG PO TABS: 1.0000 | ORAL_TABLET | ORAL | 0 refills | Status: DC | PRN

## 2020-09-28 MED ORDER — PROCHLORPERAZINE MALEATE 10 MG PO TABS
ORAL_TABLET | ORAL | Status: AC
  Filled 2020-09-28: qty 1

## 2020-09-28 MED ORDER — SODIUM CHLORIDE 0.9% FLUSH
10.0000 mL | INTRAVENOUS | Status: DC | PRN
  Administered 2020-09-28: 10 mL
  Filled 2020-09-28: qty 10

## 2020-09-28 MED ORDER — HEPARIN SOD (PORK) LOCK FLUSH 100 UNIT/ML IV SOLN
500.0000 [IU] | Freq: Once | INTRAVENOUS | Status: AC | PRN
  Administered 2020-09-28: 500 [IU]
  Filled 2020-09-28: qty 5

## 2020-09-28 NOTE — Progress Notes (Signed)
Hematology and Oncology Follow Up Visit  Danny Wood [id number] 04-16-1951 70 y.o. 09/28/2020 11:23 AM Danny Wood, Danny Brow, MD   Principle Diagnosis: 71 year old man with stage IV high-grade urothelial carcinoma with metastatic disease to pelvic and abdominal adenopathy diagnosed in 2019.   He presented with localized disease in 2015.   Prior Therapy:  He underwent a cystoscopy and a TURBT on 05/17/2014.   Neoadjuvant systemic chemotherapy in the form of gemcitabine and cisplatin cycle 1 day 1 is on 06/17/2014. He is S/P two cycles completed in 07/2014.  Therapy tolerated poorly with symptoms of nausea and vomiting and worsening renal function.  He is status post robotic cystoprostatectomy and bilateral lymphadenectomy completed on September 09, 2014.  The final pathology revealed T2N0 without any lymph node involvement.  He developed pelvic adenopathy that was biopsy-proven on Jan 14, 2018 to be metastatic urothelial carcinoma.  Carboplatin and gemcitabine cycle 1 started on 02/17/2018.  He completed 8 cycles of therapy in December 2019.  Pembrolizumab 200 mg every 3 weeks started on April 01, 2019.  He is status post 7 cycles of therapy in December 2020.  Therapy stopped because of of patient preference, excellent clinical response and treatment holiday.  He developed progression of disease in June 2021.   Current therapy: Padcev started on February 11, 2020.  He is currently receiving it at  0.75 mg/kg on day 1 and day 15 of each cycle.  He is here for day 15 of cycle 8.  Interim History: Danny Wood returns today for repeat evaluation.  Since the last visit, he reports no major changes in his health.  He continues to have issues with thigh and leg pain and unilateral edema.  Despite multiple imaging studies is no clear-cut etiology at this time.  He is still able to ambulate and attends to activities of daily living.  He denies any nausea, vomiting or abdominal pain.  He  denies any worsening neuropathy or any complications related to chemotherapy.  He is eating well and his appetite remained reasonable.                Medications: Updated on review. Current Outpatient Medications  Medication Sig Dispense Refill  . diphenoxylate-atropine (LOMOTIL) 2.5-0.025 MG tablet 1 to 2 PO QID prn diarrhea (Patient not taking: Reported on 08/31/2020) 45 tablet 2  . Insulin Glargine (BASAGLAR KWIKPEN) 100 UNIT/ML Inject 8 Units into the skin every morning. (Patient not taking: Reported on 08/31/2020) 9 mL 2  . insulin lispro (HUMALOG KWIKPEN) 100 UNIT/ML KwikPen Inject 2 Units into the skin daily with supper. (Patient not taking: Reported on 08/31/2020) 1.8 mL 3  . oxyCODONE-acetaminophen (PERCOCET) 10-325 MG tablet Take 1 tablet by mouth every 4 (four) hours as needed for pain. 60 tablet 0  . prochlorperazine (COMPAZINE) 10 MG tablet TAKE 1 TABLET(10 MG) BY MOUTH EVERY 6 HOURS AS NEEDED FOR NAUSEA OR VOMITING 30 tablet 0  . sertraline (ZOLOFT) 25 MG tablet TAKE 1 TABLET(25 MG) BY MOUTH DAILY (Patient not taking: Reported on 08/31/2020) 30 tablet 1   No current facility-administered medications for this visit.     Allergies: No Known Allergies    Physical Exam:  Blood pressure 125/72, pulse 83, temperature 97.7 F (36.5 C), temperature source Temporal, resp. rate 18, weight 180 lb 14.4 oz (82.1 kg), SpO2 98 %.   ECOG: 1   General appearance: Comfortable appearing without any discomfort Head: Normocephalic without any trauma Oropharynx: Mucous membranes are moist and pink without  any thrush or ulcers. Eyes: Pupils are equal and round reactive to light. Lymph nodes: No cervical, supraclavicular, inguinal or axillary lymphadenopathy.   Heart:regular rate and rhythm.  S1 and S2 without leg edema. Lung: Clear without any rhonchi or wheezes.  No dullness to percussion. Abdomin: Soft, nontender, nondistended with good bowel sounds.  No  hepatosplenomegaly. Musculoskeletal: Right thigh and leg edema noted. Neurological: No deficits noted on motor, sensory and deep tendon reflex exam. Skin: No petechial rash or dryness.  Appeared moist.              .   Lab Results: Lab Results  Component Value Date   WBC 6.6 09/14/2020   HGB 14.0 09/14/2020   HCT 39.4 09/14/2020   MCV 87.0 09/14/2020   PLT 191 09/14/2020     Chemistry         Impression and Plan:  70 year old man with:    1.  Stage IV high-grade urothelial carcinoma with pelvic and abdominal lymphadenopathy diagnosed in 2019.  He continues to be on palliative chemotherapy utilizing Padcev without any major complications.  He continues to have excellent clinical response and radiographic response as well confirmed by imaging studies in December 2021.  Risks and benefits of continuing this medication were discussed.  Alternative treatment options would be systemic chemotherapy or FGFR inhibitors if he harbors appropriate mutation.  He is agreeable to continue at this time.  He is agreeable to continue at this time.  2.  IV access: Port-A-Cath currently in use without any issues.  3.  Weight loss and lack of appetite: Continues to improve and eat better.  4.  Pain: His pain has been related to malignancy predominantly his abdominal area and thigh pain.  Currently on Percocet with reasonable pain control.  We will refill for him today.   5.  Prognosis: His disease is incurable although aggressive measures are warranted given his reasonable performance status.  6.  Thigh pain and swelling: Unclear etiology at this time.  Myositis versus vascular pathology remains a consideration.  We will repeat imaging studies in the future to determine the appropriate management choices.  We will try Lasix to potentially improve his swelling which could be related to fluid retention or lymphatic drainage interruption.  7. Followup: He will continue to follow  every 2 weeks for his current treatment.  30  minutes were spent on this visit.  The time was dedicated to reviewing his disease status, discussing treatment options and complications related to his cancer and cancer therapy.   Danny Button, MD 1/27/202211:23 AM

## 2020-09-28 NOTE — Patient Instructions (Signed)
[facility] Discharge Instructions for Patients Receiving Chemotherapy  Today you received the following chemotherapy agents: padcev.  To help prevent nausea and vomiting after your treatment, we encourage you to take your nausea medication as directed.   If you develop nausea and vomiting that is not controlled by your nausea medication, call the clinic.   BELOW ARE SYMPTOMS THAT SHOULD BE REPORTED IMMEDIATELY:  *FEVER GREATER THAN 100.5 F  *CHILLS WITH OR WITHOUT FEVER  NAUSEA AND VOMITING THAT IS NOT CONTROLLED WITH YOUR NAUSEA MEDICATION  *UNUSUAL SHORTNESS OF BREATH  *UNUSUAL BRUISING OR BLEEDING  TENDERNESS IN MOUTH AND THROAT WITH OR WITHOUT PRESENCE OF ULCERS  *URINARY PROBLEMS  *BOWEL PROBLEMS  UNUSUAL RASH Items with * indicate a potential emergency and should be followed up as soon as possible.  Feel free to call the clinic should you have any questions or concerns. The clinic phone number is [phone].  Please show the CHEMO ALERT CARD at check-in to the Emergency Department and triage nurse.   

## 2020-10-12 ENCOUNTER — Other Ambulatory Visit: Payer: Self-pay

## 2020-10-12 ENCOUNTER — Inpatient Hospital Stay: Payer: Medicare Other

## 2020-10-12 ENCOUNTER — Inpatient Hospital Stay: Payer: Medicare Other | Attending: Oncology

## 2020-10-12 VITALS — BP 139/64 | HR 66 | Temp 98.3°F | Resp 18 | Ht 74.0 in | Wt 181.2 lb

## 2020-10-12 DIAGNOSIS — C679 Malignant neoplasm of bladder, unspecified: Secondary | ICD-10-CM | POA: Insufficient documentation

## 2020-10-12 DIAGNOSIS — Z79899 Other long term (current) drug therapy: Secondary | ICD-10-CM | POA: Insufficient documentation

## 2020-10-12 DIAGNOSIS — Z5112 Encounter for antineoplastic immunotherapy: Secondary | ICD-10-CM | POA: Insufficient documentation

## 2020-10-12 DIAGNOSIS — G893 Neoplasm related pain (acute) (chronic): Secondary | ICD-10-CM | POA: Insufficient documentation

## 2020-10-12 DIAGNOSIS — C772 Secondary and unspecified malignant neoplasm of intra-abdominal lymph nodes: Secondary | ICD-10-CM | POA: Insufficient documentation

## 2020-10-12 LAB — CBC WITH DIFFERENTIAL (CANCER CENTER ONLY)
Abs Immature Granulocytes: 0.02 10*3/uL (ref 0.00–0.07)
Basophils Absolute: 0 10*3/uL (ref 0.0–0.1)
Basophils Relative: 0 %
Eosinophils Absolute: 0.1 10*3/uL (ref 0.0–0.5)
Eosinophils Relative: 1 %
HCT: 38.4 % — ABNORMAL LOW (ref 39.0–52.0)
Hemoglobin: 13.7 g/dL (ref 13.0–17.0)
Immature Granulocytes: 0 %
Lymphocytes Relative: 23 %
Lymphs Abs: 1.7 10*3/uL (ref 0.7–4.0)
MCH: 30.9 pg (ref 26.0–34.0)
MCHC: 35.7 g/dL (ref 30.0–36.0)
MCV: 86.7 fL (ref 80.0–100.0)
Monocytes Absolute: 0.6 10*3/uL (ref 0.1–1.0)
Monocytes Relative: 8 %
Neutro Abs: 5 10*3/uL (ref 1.7–7.7)
Neutrophils Relative %: 68 %
Platelet Count: 183 10*3/uL (ref 150–400)
RBC: 4.43 MIL/uL (ref 4.22–5.81)
RDW: 12.9 % (ref 11.5–15.5)
WBC Count: 7.5 10*3/uL (ref 4.0–10.5)
nRBC: 0 % (ref 0.0–0.2)

## 2020-10-12 LAB — CMP (CANCER CENTER ONLY)
ALT: 35 U/L (ref 0–44)
AST: 31 U/L (ref 15–41)
Albumin: 3.5 g/dL (ref 3.5–5.0)
Alkaline Phosphatase: 71 U/L (ref 38–126)
Anion gap: 6 (ref 5–15)
BUN: 22 mg/dL (ref 8–23)
CO2: 28 mmol/L (ref 22–32)
Calcium: 8.3 mg/dL — ABNORMAL LOW (ref 8.9–10.3)
Chloride: 103 mmol/L (ref 98–111)
Creatinine: 1.08 mg/dL (ref 0.61–1.24)
GFR, Estimated: >60 mL/min (ref >60)
Glucose, Bld: 265 mg/dL — ABNORMAL HIGH (ref 70–99)
Potassium: 4.1 mmol/L (ref 3.5–5.1)
Sodium: 137 mmol/L (ref 135–145)
Total Bilirubin: 0.6 mg/dL (ref 0.3–1.2)
Total Protein: 6.1 g/dL — ABNORMAL LOW (ref 6.5–8.1)

## 2020-10-12 MED ORDER — SODIUM CHLORIDE 0.9 % IV SOLN
0.8000 mg/kg | Freq: Once | INTRAVENOUS | Status: AC
Start: 2020-10-12 — End: 2020-10-12
  Administered 2020-10-12: 60 mg via INTRAVENOUS
  Filled 2020-10-12: qty 6

## 2020-10-12 MED ORDER — PROCHLORPERAZINE MALEATE 10 MG PO TABS
10.0000 mg | ORAL_TABLET | Freq: Once | ORAL | Status: AC
  Administered 2020-10-12: 10 mg via ORAL

## 2020-10-12 MED ORDER — SODIUM CHLORIDE 0.9 % IV SOLN
10.0000 mg | Freq: Once | INTRAVENOUS | Status: AC
  Administered 2020-10-12: 10 mg via INTRAVENOUS
  Filled 2020-10-12: qty 10

## 2020-10-12 MED ORDER — SODIUM CHLORIDE 0.9 % IV SOLN
Freq: Once | INTRAVENOUS | Status: AC
  Filled 2020-10-12: qty 250

## 2020-10-12 MED ORDER — PROCHLORPERAZINE MALEATE 10 MG PO TABS
ORAL_TABLET | ORAL | Status: AC
  Filled 2020-10-12: qty 1

## 2020-10-12 MED ORDER — SODIUM CHLORIDE 0.9% FLUSH
10.0000 mL | INTRAVENOUS | Status: DC | PRN
  Administered 2020-10-12: 10 mL
  Filled 2020-10-12: qty 10

## 2020-10-12 MED ORDER — HEPARIN SOD (PORK) LOCK FLUSH 100 UNIT/ML IV SOLN
500.0000 [IU] | Freq: Once | INTRAVENOUS | Status: AC | PRN
  Administered 2020-10-12: 500 [IU]
  Filled 2020-10-12: qty 5

## 2020-10-12 NOTE — Patient Instructions (Signed)
[facility] Discharge Instructions for Patients Receiving Chemotherapy  Today you received the following chemotherapy agents Padcev  To help prevent nausea and vomiting after your treatment, we encourage you to take your nausea medication  As directed  If you develop nausea and vomiting that is not controlled by your nausea medication, call the clinic.   BELOW ARE SYMPTOMS THAT SHOULD BE REPORTED IMMEDIATELY:  *FEVER GREATER THAN 100.5 F  *CHILLS WITH OR WITHOUT FEVER  NAUSEA AND VOMITING THAT IS NOT CONTROLLED WITH YOUR NAUSEA MEDICATION  *UNUSUAL SHORTNESS OF BREATH  *UNUSUAL BRUISING OR BLEEDING  TENDERNESS IN MOUTH AND THROAT WITH OR WITHOUT PRESENCE OF ULCERS  *URINARY PROBLEMS  *BOWEL PROBLEMS  UNUSUAL RASH Items with * indicate a potential emergency and should be followed up as soon as possible.  Feel free to call the clinic should you have any questions or concerns. The clinic phone number is [phone].  Please show the [place] at check-in to the Emergency Department and triage nurse.

## 2020-10-12 NOTE — Progress Notes (Signed)
Ok to treat w/ glucose 265 per Dr. Alen Blew

## 2020-10-13 ENCOUNTER — Telehealth: Payer: Self-pay | Admitting: Oncology

## 2020-10-13 NOTE — Telephone Encounter (Signed)
Called patient regarding upcoming 02/24 appointment, patient is notified.

## 2020-10-23 ENCOUNTER — Other Ambulatory Visit: Payer: Self-pay | Admitting: Oncology

## 2020-10-23 ENCOUNTER — Telehealth: Payer: Self-pay

## 2020-10-23 MED ORDER — FUROSEMIDE 20 MG PO TABS: 40.0000 mg | ORAL_TABLET | Freq: Every day | ORAL | 1 refills | Status: DC

## 2020-10-23 MED ORDER — OXYCODONE-ACETAMINOPHEN 10-325 MG PO TABS: 1.0000 | ORAL_TABLET | ORAL | 0 refills | Status: DC | PRN

## 2020-10-23 NOTE — Telephone Encounter (Signed)
Informed patient of Dr. Hazeline Junker response below. Patient verbalized understanding.

## 2020-10-23 NOTE — Telephone Encounter (Signed)
-----   Message from Wyatt Portela, MD sent at 10/23/2020 11:13 AM EST ----- Regarding: RE: Patient question and refill request I will refill percocet. He can take two lasix (40mg ) instead. Thanks ----- Message ----- From: Teodoro Spray, RN Sent: 10/23/2020  11:03 AM EST To: Wyatt Portela, MD Subject: Patient question and refill request            Patient called stating the swelling in his R leg has not improved since starting lasix 20 mg. Patient states swelling has not gotten worse, but was wondering if his dose of lasix needs to be adjusted.  Please advise.    Patient is also requesting a refill of his percocet. Last filled on 09/28/20 for 60 tabs.

## 2020-10-26 ENCOUNTER — Other Ambulatory Visit: Payer: Self-pay

## 2020-10-26 ENCOUNTER — Inpatient Hospital Stay: Payer: Medicare Other

## 2020-10-26 VITALS — BP 127/67 | HR 77 | Temp 98.0°F | Resp 18 | Wt 179.8 lb

## 2020-10-26 DIAGNOSIS — Z5112 Encounter for antineoplastic immunotherapy: Secondary | ICD-10-CM | POA: Diagnosis not present

## 2020-10-26 DIAGNOSIS — C679 Malignant neoplasm of bladder, unspecified: Secondary | ICD-10-CM

## 2020-10-26 DIAGNOSIS — Z95828 Presence of other vascular implants and grafts: Secondary | ICD-10-CM

## 2020-10-26 LAB — CBC WITH DIFFERENTIAL (CANCER CENTER ONLY)
Abs Immature Granulocytes: 0.02 10*3/uL (ref 0.00–0.07)
Basophils Absolute: 0 10*3/uL (ref 0.0–0.1)
Basophils Relative: 0 %
Eosinophils Absolute: 0.1 10*3/uL (ref 0.0–0.5)
Eosinophils Relative: 1 %
HCT: 39.1 % (ref 39.0–52.0)
Hemoglobin: 13.8 g/dL (ref 13.0–17.0)
Immature Granulocytes: 0 %
Lymphocytes Relative: 19 %
Lymphs Abs: 1.5 10*3/uL (ref 0.7–4.0)
MCH: 30.6 pg (ref 26.0–34.0)
MCHC: 35.3 g/dL (ref 30.0–36.0)
MCV: 86.7 fL (ref 80.0–100.0)
Monocytes Absolute: 0.7 10*3/uL (ref 0.1–1.0)
Monocytes Relative: 8 %
Neutro Abs: 5.4 10*3/uL (ref 1.7–7.7)
Neutrophils Relative %: 72 %
Platelet Count: 207 10*3/uL (ref 150–400)
RBC: 4.51 MIL/uL (ref 4.22–5.81)
RDW: 12.9 % (ref 11.5–15.5)
WBC Count: 7.7 10*3/uL (ref 4.0–10.5)
nRBC: 0 % (ref 0.0–0.2)

## 2020-10-26 LAB — CMP (CANCER CENTER ONLY)
ALT: 29 U/L (ref 0–44)
AST: 28 U/L (ref 15–41)
Albumin: 3.5 g/dL (ref 3.5–5.0)
Alkaline Phosphatase: 67 U/L (ref 38–126)
Anion gap: 8 (ref 5–15)
BUN: 22 mg/dL (ref 8–23)
CO2: 28 mmol/L (ref 22–32)
Calcium: 8.7 mg/dL — ABNORMAL LOW (ref 8.9–10.3)
Chloride: 100 mmol/L (ref 98–111)
Creatinine: 1.01 mg/dL (ref 0.61–1.24)
GFR, Estimated: >60 mL/min (ref >60)
Glucose, Bld: 219 mg/dL — ABNORMAL HIGH (ref 70–99)
Potassium: 4.1 mmol/L (ref 3.5–5.1)
Sodium: 136 mmol/L (ref 135–145)
Total Bilirubin: 0.5 mg/dL (ref 0.3–1.2)
Total Protein: 6.4 g/dL — ABNORMAL LOW (ref 6.5–8.1)

## 2020-10-26 MED ORDER — SODIUM CHLORIDE 0.9% FLUSH
10.0000 mL | Freq: Once | INTRAVENOUS | Status: AC
  Administered 2020-10-26: 10 mL
  Filled 2020-10-26: qty 10

## 2020-10-26 MED ORDER — SODIUM CHLORIDE 0.9 % IV SOLN
10.0000 mg | Freq: Once | INTRAVENOUS | Status: AC
  Administered 2020-10-26: 10 mg via INTRAVENOUS
  Filled 2020-10-26: qty 10

## 2020-10-26 MED ORDER — SODIUM CHLORIDE 0.9 % IV SOLN
0.8000 mg/kg | Freq: Once | INTRAVENOUS | Status: AC
Start: 2020-10-26 — End: 2020-10-26
  Administered 2020-10-26: 60 mg via INTRAVENOUS
  Filled 2020-10-26: qty 6

## 2020-10-26 MED ORDER — SODIUM CHLORIDE 0.9 % IV SOLN
Freq: Once | INTRAVENOUS | Status: AC
  Filled 2020-10-26: qty 250

## 2020-10-26 MED ORDER — PROCHLORPERAZINE MALEATE 10 MG PO TABS
10.0000 mg | ORAL_TABLET | Freq: Once | ORAL | Status: AC
  Administered 2020-10-26: 10 mg via ORAL

## 2020-10-26 MED ORDER — SODIUM CHLORIDE 0.9% FLUSH
10.0000 mL | INTRAVENOUS | Status: DC | PRN
  Administered 2020-10-26: 10 mL
  Filled 2020-10-26: qty 10

## 2020-10-26 MED ORDER — HEPARIN SOD (PORK) LOCK FLUSH 100 UNIT/ML IV SOLN
500.0000 [IU] | Freq: Once | INTRAVENOUS | Status: AC | PRN
  Administered 2020-10-26: 500 [IU]
  Filled 2020-10-26: qty 5

## 2020-10-26 MED ORDER — PROCHLORPERAZINE MALEATE 10 MG PO TABS
ORAL_TABLET | ORAL | Status: AC
  Filled 2020-10-26: qty 1

## 2020-10-26 NOTE — Patient Instructions (Signed)
[facility] Discharge Instructions for Patients Receiving Chemotherapy  Today you received the following chemotherapy agents: Padcev   To help prevent nausea and vomiting after your treatment, we encourage you to take your nausea medication as directed.    If you develop nausea and vomiting that is not controlled by your nausea medication, call the clinic.   BELOW ARE SYMPTOMS THAT SHOULD BE REPORTED IMMEDIATELY:  *FEVER GREATER THAN 100.5 F  *CHILLS WITH OR WITHOUT FEVER  NAUSEA AND VOMITING THAT IS NOT CONTROLLED WITH YOUR NAUSEA MEDICATION  *UNUSUAL SHORTNESS OF BREATH  *UNUSUAL BRUISING OR BLEEDING  TENDERNESS IN MOUTH AND THROAT WITH OR WITHOUT PRESENCE OF ULCERS  *URINARY PROBLEMS  *BOWEL PROBLEMS  UNUSUAL RASH Items with * indicate a potential emergency and should be followed up as soon as possible.  Feel free to call the clinic should you have any questions or concerns. The clinic phone number is [phone].  Please show the CHEMO ALERT CARD at check-in to the Emergency Department and triage nurse.   

## 2020-10-26 NOTE — Patient Instructions (Signed)
Implanted Port Insertion, Care After This sheet gives you information about how to care for yourself after your procedure. Your health care provider may also give you more specific instructions. If you have problems or questions, contact your health care provider. What can I expect after the procedure? After the procedure, it is common to have:  Discomfort at the port insertion site.  Bruising on the skin over the port. This should improve over 3-4 days. Follow these instructions at home: Port care  After your port is placed, you will get a manufacturer's information card. The card has information about your port. Keep this card with you at all times.  Take care of the port as told by your health care provider. Ask your health care provider if you or a family member can get training for taking care of the port at home. A home health care nurse may also take care of the port.  Make sure to remember what type of port you have. Incision care  Follow instructions from your health care provider about how to take care of your port insertion site. Make sure you: ? Wash your hands with soap and water before and after you change your bandage (dressing). If soap and water are not available, use hand sanitizer. ? Change your dressing as told by your health care provider. ? Leave stitches (sutures), skin glue, or adhesive strips in place. These skin closures may need to stay in place for 2 weeks or longer. If adhesive strip edges start to loosen and curl up, you may trim the loose edges. Do not remove adhesive strips completely unless your health care provider tells you to do that.  Check your port insertion site every day for signs of infection. Check for: ? Redness, swelling, or pain. ? Fluid or blood. ? Warmth. ? Pus or a bad smell.      Activity  Return to your normal activities as told by your health care provider. Ask your health care provider what activities are safe for you.  Do not  lift anything that is heavier than 10 lb (4.5 kg), or the limit that you are told, until your health care provider says that it is safe. General instructions  Take over-the-counter and prescription medicines only as told by your health care provider.  Do not take baths, swim, or use a hot tub until your health care provider approves. Ask your health care provider if you may take showers. You may only be allowed to take sponge baths.  Do not drive for 24 hours if you were given a sedative during your procedure.  Wear a medical alert bracelet in case of an emergency. This will tell any health care providers that you have a port.  Keep all follow-up visits as told by your health care provider. This is important. Contact a health care provider if:  You cannot flush your port with saline as directed, or you cannot draw blood from the port.  You have a fever or chills.  You have redness, swelling, or pain around your port insertion site.  You have fluid or blood coming from your port insertion site.  Your port insertion site feels warm to the touch.  You have pus or a bad smell coming from the port insertion site. Get help right away if:  You have chest pain or shortness of breath.  You have bleeding from your port that you cannot control. Summary  Take care of the port as told by your   health care provider. Keep the manufacturer's information card with you at all times.  Change your dressing as told by your health care provider.  Contact a health care provider if you have a fever or chills or if you have redness, swelling, or pain around your port insertion site.  Keep all follow-up visits as told by your health care provider. This information is not intended to replace advice given to you by your health care provider. Make sure you discuss any questions you have with your health care provider. Document Revised: 03/17/2018 Document Reviewed: 03/17/2018 Elsevier Patient Education   2021 [employer]  

## 2020-11-08 MED FILL — Dexamethasone Sodium Phosphate Inj 100 MG/10ML: INTRAMUSCULAR | Qty: 1 | Status: AC

## 2020-11-09 ENCOUNTER — Other Ambulatory Visit: Payer: Self-pay

## 2020-11-09 ENCOUNTER — Inpatient Hospital Stay: Payer: Medicare Other

## 2020-11-09 ENCOUNTER — Inpatient Hospital Stay: Payer: Medicare Other | Attending: Oncology

## 2020-11-09 ENCOUNTER — Inpatient Hospital Stay (HOSPITAL_BASED_OUTPATIENT_CLINIC_OR_DEPARTMENT_OTHER): Payer: Medicare Other | Admitting: Oncology

## 2020-11-09 VITALS — BP 136/74 | HR 75 | Temp 97.8°F | Resp 15 | Ht 74.0 in | Wt 179.5 lb

## 2020-11-09 DIAGNOSIS — C679 Malignant neoplasm of bladder, unspecified: Secondary | ICD-10-CM

## 2020-11-09 DIAGNOSIS — Z79899 Other long term (current) drug therapy: Secondary | ICD-10-CM | POA: Insufficient documentation

## 2020-11-09 DIAGNOSIS — G8929 Other chronic pain: Secondary | ICD-10-CM | POA: Insufficient documentation

## 2020-11-09 DIAGNOSIS — Z95828 Presence of other vascular implants and grafts: Secondary | ICD-10-CM

## 2020-11-09 DIAGNOSIS — Z5112 Encounter for antineoplastic immunotherapy: Secondary | ICD-10-CM | POA: Insufficient documentation

## 2020-11-09 DIAGNOSIS — C778 Secondary and unspecified malignant neoplasm of lymph nodes of multiple regions: Secondary | ICD-10-CM | POA: Diagnosis not present

## 2020-11-09 DIAGNOSIS — M79659 Pain in unspecified thigh: Secondary | ICD-10-CM | POA: Insufficient documentation

## 2020-11-09 LAB — CMP (CANCER CENTER ONLY)
ALT: 35 U/L (ref 0–44)
AST: 29 U/L (ref 15–41)
Albumin: 3.6 g/dL (ref 3.5–5.0)
Alkaline Phosphatase: 93 U/L (ref 38–126)
Anion gap: 6 (ref 5–15)
BUN: 24 mg/dL — ABNORMAL HIGH (ref 8–23)
CO2: 28 mmol/L (ref 22–32)
Calcium: 8.7 mg/dL — ABNORMAL LOW (ref 8.9–10.3)
Chloride: 100 mmol/L (ref 98–111)
Creatinine: 1.18 mg/dL (ref 0.61–1.24)
GFR, Estimated: >60 mL/min (ref >60)
Glucose, Bld: 294 mg/dL — ABNORMAL HIGH (ref 70–99)
Potassium: 4.2 mmol/L (ref 3.5–5.1)
Sodium: 134 mmol/L — ABNORMAL LOW (ref 135–145)
Total Bilirubin: 0.5 mg/dL (ref 0.3–1.2)
Total Protein: 6.7 g/dL (ref 6.5–8.1)

## 2020-11-09 LAB — CBC WITH DIFFERENTIAL (CANCER CENTER ONLY)
Abs Immature Granulocytes: 0.03 10*3/uL (ref 0.00–0.07)
Basophils Absolute: 0 10*3/uL (ref 0.0–0.1)
Basophils Relative: 0 %
Eosinophils Absolute: 0.1 10*3/uL (ref 0.0–0.5)
Eosinophils Relative: 1 %
HCT: 39.9 % (ref 39.0–52.0)
Hemoglobin: 14.5 g/dL (ref 13.0–17.0)
Immature Granulocytes: 0 %
Lymphocytes Relative: 25 %
Lymphs Abs: 1.8 10*3/uL (ref 0.7–4.0)
MCH: 30.9 pg (ref 26.0–34.0)
MCHC: 36.3 g/dL — ABNORMAL HIGH (ref 30.0–36.0)
MCV: 85.1 fL (ref 80.0–100.0)
Monocytes Absolute: 0.6 10*3/uL (ref 0.1–1.0)
Monocytes Relative: 9 %
Neutro Abs: 4.6 10*3/uL (ref 1.7–7.7)
Neutrophils Relative %: 65 %
Platelet Count: 213 10*3/uL (ref 150–400)
RBC: 4.69 MIL/uL (ref 4.22–5.81)
RDW: 12.8 % (ref 11.5–15.5)
WBC Count: 7.2 10*3/uL (ref 4.0–10.5)
nRBC: 0 % (ref 0.0–0.2)

## 2020-11-09 MED ORDER — OXYCODONE-ACETAMINOPHEN 10-325 MG PO TABS: 1.0000 | ORAL_TABLET | ORAL | 0 refills | Status: DC | PRN

## 2020-11-09 MED ORDER — SODIUM CHLORIDE 0.9% FLUSH
10.0000 mL | Freq: Once | INTRAVENOUS | Status: AC
  Administered 2020-11-09: 10 mL
  Filled 2020-11-09: qty 10

## 2020-11-09 NOTE — Addendum Note (Signed)
Addended by: Wyatt Portela on: 11/09/2020 12:16 PM   Modules accepted: Orders

## 2020-11-09 NOTE — Patient Instructions (Signed)
Implanted Port Insertion, Care After This sheet gives you information about how to care for yourself after your procedure. Your health care provider may also give you more specific instructions. If you have problems or questions, contact your health care provider. What can I expect after the procedure? After the procedure, it is common to have:  Discomfort at the port insertion site.  Bruising on the skin over the port. This should improve over 3-4 days. Follow these instructions at home: Port care  After your port is placed, you will get a manufacturer's information card. The card has information about your port. Keep this card with you at all times.  Take care of the port as told by your health care provider. Ask your health care provider if you or a family member can get training for taking care of the port at home. A home health care nurse may also take care of the port.  Make sure to remember what type of port you have. Incision care  Follow instructions from your health care provider about how to take care of your port insertion site. Make sure you: ? Wash your hands with soap and water before and after you change your bandage (dressing). If soap and water are not available, use hand sanitizer. ? Change your dressing as told by your health care provider. ? Leave stitches (sutures), skin glue, or adhesive strips in place. These skin closures may need to stay in place for 2 weeks or longer. If adhesive strip edges start to loosen and curl up, you may trim the loose edges. Do not remove adhesive strips completely unless your health care provider tells you to do that.  Check your port insertion site every day for signs of infection. Check for: ? Redness, swelling, or pain. ? Fluid or blood. ? Warmth. ? Pus or a bad smell.      Activity  Return to your normal activities as told by your health care provider. Ask your health care provider what activities are safe for you.  Do not  lift anything that is heavier than 10 lb (4.5 kg), or the limit that you are told, until your health care provider says that it is safe. General instructions  Take over-the-counter and prescription medicines only as told by your health care provider.  Do not take baths, swim, or use a hot tub until your health care provider approves. Ask your health care provider if you may take showers. You may only be allowed to take sponge baths.  Do not drive for 24 hours if you were given a sedative during your procedure.  Wear a medical alert bracelet in case of an emergency. This will tell any health care providers that you have a port.  Keep all follow-up visits as told by your health care provider. This is important. Contact a health care provider if:  You cannot flush your port with saline as directed, or you cannot draw blood from the port.  You have a fever or chills.  You have redness, swelling, or pain around your port insertion site.  You have fluid or blood coming from your port insertion site.  Your port insertion site feels warm to the touch.  You have pus or a bad smell coming from the port insertion site. Get help right away if:  You have chest pain or shortness of breath.  You have bleeding from your port that you cannot control. Summary  Take care of the port as told by your   health care provider. Keep the manufacturer's information card with you at all times.  Change your dressing as told by your health care provider.  Contact a health care provider if you have a fever or chills or if you have redness, swelling, or pain around your port insertion site.  Keep all follow-up visits as told by your health care provider. This information is not intended to replace advice given to you by your health care provider. Make sure you discuss any questions you have with your health care provider. Document Revised: 03/17/2018 Document Reviewed: 03/17/2018 Elsevier Patient Education   2021 [employer]  

## 2020-11-09 NOTE — Progress Notes (Signed)
Pt here for treatment today. Dr Alen Blew recommends holding his Pedcev due to hyperglycemia. Patent verbalized understanding.

## 2020-11-09 NOTE — Progress Notes (Signed)
Hematology and Oncology Follow Up Visit  Danny Wood [id number] 1951-03-27 70 y.o. 11/09/2020 11:44 AM Danny Wood Danny Wood, Danny Brow, MD   Principle Diagnosis: 70 year old man with bladder cancer in 2015.  He developed stage IV high-grade urothelial carcinoma with metastatic disease to pelvic and abdominal adenopathy diagnosed in 2019.     Prior Therapy:  He underwent a cystoscopy and a TURBT on 05/17/2014.   Neoadjuvant systemic chemotherapy in the form of gemcitabine and cisplatin cycle 1 day 1 is on 06/17/2014. He is S/P two cycles completed in 07/2014.  Therapy tolerated poorly with symptoms of nausea and vomiting and worsening renal function.  He is status post robotic cystoprostatectomy and bilateral lymphadenectomy completed on September 09, 2014.  The final pathology revealed T2N0 without any lymph node involvement.  He developed pelvic adenopathy that was biopsy-proven on Jan 14, 2018 to be metastatic urothelial carcinoma.  Carboplatin and gemcitabine cycle 1 started on 02/17/2018.  He completed 8 cycles of therapy in December 2019.  Pembrolizumab 200 mg every 3 weeks started on April 01, 2019.  He is status post 7 cycles of therapy in December 2020.  Therapy stopped because of of patient preference, excellent clinical response and treatment holiday.  He developed progression of disease in June 2021.   Current therapy: Padcev started on February 11, 2020.  He is currently receiving it at  0.75 mg/kg on day 1 and day 15 of each cycle.  He is here for day 1 of cycle 10.  Interim History: Danny Wood is here for a follow-up visit.  Since the last visit, he reports feeling well without any major complaints.  He has tolerated therapy without any new issues or complaints.  He denies any nausea, vomiting or abdominal pain.  He denies any worsening neuropathy or weight loss.  Continues to have chronic pain in his thigh associated with swelling that has not improved with Lasix.   Otherwise, he still enjoy reasonable performance status including improved activity level.                Medications: Reviewed without changes. Current Outpatient Medications  Medication Sig Dispense Refill  . diphenoxylate-atropine (LOMOTIL) 2.5-0.025 MG tablet 1 to 2 PO QID prn diarrhea (Patient not taking: Reported on 08/31/2020) 45 tablet 2  . furosemide (LASIX) 20 MG tablet Take 2 tablets (40 mg total) by mouth daily. 90 tablet 1  . Insulin Glargine (BASAGLAR KWIKPEN) 100 UNIT/ML Inject 8 Units into the skin every morning. (Patient not taking: Reported on 08/31/2020) 9 mL 2  . insulin lispro (HUMALOG KWIKPEN) 100 UNIT/ML KwikPen Inject 2 Units into the skin daily with supper. (Patient not taking: Reported on 08/31/2020) 1.8 mL 3  . oxyCODONE-acetaminophen (PERCOCET) 10-325 MG tablet Take 1 tablet by mouth every 4 (four) hours as needed for pain. 60 tablet 0  . prochlorperazine (COMPAZINE) 10 MG tablet TAKE 1 TABLET(10 MG) BY MOUTH EVERY 6 HOURS AS NEEDED FOR NAUSEA OR VOMITING 30 tablet 0  . sertraline (ZOLOFT) 25 MG tablet TAKE 1 TABLET(25 MG) BY MOUTH DAILY (Patient not taking: Reported on 08/31/2020) 30 tablet 1   No current facility-administered medications for this visit.     Allergies: No Known Allergies    Physical Exam:  Blood pressure 136/74, pulse 75, temperature 97.8 F (36.6 C), temperature source Tympanic, resp. rate 15, height 6\' 2"  (1.88 m), weight 179 lb 8 oz (81.4 kg), SpO2 100 %.   ECOG: 1    General appearance: Alert, awake without any  distress. Head: Atraumatic without abnormalities Oropharynx: Without any thrush or ulcers. Eyes: No scleral icterus. Lymph nodes: No lymphadenopathy noted in the cervical, supraclavicular, or axillary nodes Heart:regular rate and rhythm, without any murmurs or gallops.   Lung: Clear to auscultation without any rhonchi, wheezes or dullness to percussion. Abdomin: Soft, nontender without any shifting dullness  or ascites. Musculoskeletal: No clubbing or cyanosis. Neurological: No motor or sensory deficits. Skin: No rashes or lesions.              .   Lab Results: Lab Results  Component Value Date   WBC 7.2 11/09/2020   HGB 14.5 11/09/2020   HCT 39.9 11/09/2020   MCV 85.1 11/09/2020   PLT 213 11/09/2020     Chemistry         Impression and Plan:  70 year old man with:    1.  Bladder cancer diagnosed in 2015.  He developed stage IV high-grade urothelial carcinoma in 2019 with pelvic adenopathy  The natural course of his disease was reviewed at this time and treatment options were reiterated.  He is currently on Padcev which she has tolerated very well and I recommended continuing for the time being.  Alternative options including FGFR inhibitors if he harbors appropriate mutation or retreatment with different chemotherapy.  The plan is to proceed with this cycle of therapy and update his staging prior to the next cycle.  2.  IV access: Port-A-Cath remains in place and will be used for chemotherapy.  3.  Weight loss and lack of appetite: Overall stable without any issues.  4.  Pain: Related to his metastatic bladder cancer.  Is Percocet will be refilled for him.   5.  Prognosis: Therapy remains palliative low risk meds are warranted at this time.  6.  Thigh pain and swelling: We will update his imaging studies with the next scan including imaging of his spine.  7. Followup: In 2 weeks to complete the current cycle in 4 weeks for a follow-up.  30  minutes were dedicated to this encounter.  Time was spent on reviewing his disease status, discussing treatment options and outlining future plan of care.   Danny Button, MD 3/10/202211:44 AM

## 2020-11-16 ENCOUNTER — Other Ambulatory Visit: Payer: Self-pay | Admitting: Oncology

## 2020-11-16 ENCOUNTER — Inpatient Hospital Stay: Payer: Medicare Other

## 2020-11-16 ENCOUNTER — Other Ambulatory Visit: Payer: Self-pay

## 2020-11-16 VITALS — BP 119/68 | HR 77 | Temp 97.9°F | Resp 18

## 2020-11-16 DIAGNOSIS — C679 Malignant neoplasm of bladder, unspecified: Secondary | ICD-10-CM

## 2020-11-16 DIAGNOSIS — Z5112 Encounter for antineoplastic immunotherapy: Secondary | ICD-10-CM | POA: Diagnosis not present

## 2020-11-16 DIAGNOSIS — Z95828 Presence of other vascular implants and grafts: Secondary | ICD-10-CM

## 2020-11-16 LAB — CMP (CANCER CENTER ONLY)
ALT: 34 U/L (ref 0–44)
AST: 34 U/L (ref 15–41)
Albumin: 3.6 g/dL (ref 3.5–5.0)
Alkaline Phosphatase: 75 U/L (ref 38–126)
Anion gap: 8 (ref 5–15)
BUN: 24 mg/dL — ABNORMAL HIGH (ref 8–23)
CO2: 27 mmol/L (ref 22–32)
Calcium: 8.8 mg/dL — ABNORMAL LOW (ref 8.9–10.3)
Chloride: 102 mmol/L (ref 98–111)
Creatinine: 1.13 mg/dL (ref 0.61–1.24)
GFR, Estimated: >60 mL/min (ref >60)
Glucose, Bld: 203 mg/dL — ABNORMAL HIGH (ref 70–99)
Potassium: 4 mmol/L (ref 3.5–5.1)
Sodium: 137 mmol/L (ref 135–145)
Total Bilirubin: 0.6 mg/dL (ref 0.3–1.2)
Total Protein: 6.7 g/dL (ref 6.5–8.1)

## 2020-11-16 LAB — CBC WITH DIFFERENTIAL (CANCER CENTER ONLY)
Abs Immature Granulocytes: 0.02 10*3/uL (ref 0.00–0.07)
Basophils Absolute: 0 10*3/uL (ref 0.0–0.1)
Basophils Relative: 1 %
Eosinophils Absolute: 0.1 10*3/uL (ref 0.0–0.5)
Eosinophils Relative: 1 %
HCT: 38.9 % — ABNORMAL LOW (ref 39.0–52.0)
Hemoglobin: 13.9 g/dL (ref 13.0–17.0)
Immature Granulocytes: 0 %
Lymphocytes Relative: 28 %
Lymphs Abs: 1.5 10*3/uL (ref 0.7–4.0)
MCH: 30.5 pg (ref 26.0–34.0)
MCHC: 35.7 g/dL (ref 30.0–36.0)
MCV: 85.5 fL (ref 80.0–100.0)
Monocytes Absolute: 0.6 10*3/uL (ref 0.1–1.0)
Monocytes Relative: 11 %
Neutro Abs: 3.2 10*3/uL (ref 1.7–7.7)
Neutrophils Relative %: 59 %
Platelet Count: 190 10*3/uL (ref 150–400)
RBC: 4.55 MIL/uL (ref 4.22–5.81)
RDW: 12.7 % (ref 11.5–15.5)
WBC Count: 5.4 10*3/uL (ref 4.0–10.5)
nRBC: 0 % (ref 0.0–0.2)

## 2020-11-16 MED ORDER — SODIUM CHLORIDE 0.9 % IV SOLN
Freq: Once | INTRAVENOUS | Status: AC
  Filled 2020-11-16: qty 250

## 2020-11-16 MED ORDER — PROCHLORPERAZINE MALEATE 10 MG PO TABS
10.0000 mg | ORAL_TABLET | Freq: Once | ORAL | Status: AC
  Administered 2020-11-16: 10 mg via ORAL

## 2020-11-16 MED ORDER — SODIUM CHLORIDE 0.9 % IV SOLN
0.8000 mg/kg | Freq: Once | INTRAVENOUS | Status: AC
  Administered 2020-11-16: 60 mg via INTRAVENOUS
  Filled 2020-11-16: qty 6

## 2020-11-16 MED ORDER — PROCHLORPERAZINE MALEATE 10 MG PO TABS
ORAL_TABLET | ORAL | Status: AC
  Filled 2020-11-16: qty 1

## 2020-11-16 MED ORDER — SODIUM CHLORIDE 0.9% FLUSH
10.0000 mL | Freq: Once | INTRAVENOUS | Status: AC
  Administered 2020-11-16: 10 mL
  Filled 2020-11-16: qty 10

## 2020-11-16 MED ORDER — SODIUM CHLORIDE 0.9% FLUSH
10.0000 mL | INTRAVENOUS | Status: DC | PRN
  Administered 2020-11-16: 10 mL
  Filled 2020-11-16: qty 10

## 2020-11-16 MED ORDER — DEXAMETHASONE SODIUM PHOSPHATE 100 MG/10ML IJ SOLN
10.0000 mg | Freq: Once | INTRAMUSCULAR | Status: AC
  Administered 2020-11-16: 10 mg via INTRAVENOUS
  Filled 2020-11-16: qty 10

## 2020-11-16 MED ORDER — HEPARIN SOD (PORK) LOCK FLUSH 100 UNIT/ML IV SOLN
500.0000 [IU] | Freq: Once | INTRAVENOUS | Status: AC | PRN
  Administered 2020-11-16: 500 [IU]
  Filled 2020-11-16: qty 5

## 2020-11-16 NOTE — Patient Instructions (Signed)
[facility] Discharge Instructions for Patients Receiving Chemotherapy  Today you received the following chemotherapy agents: Padcev   To help prevent nausea and vomiting after your treatment, we encourage you to take your nausea medication as directed.    If you develop nausea and vomiting that is not controlled by your nausea medication, call the clinic.   BELOW ARE SYMPTOMS THAT SHOULD BE REPORTED IMMEDIATELY:  *FEVER GREATER THAN 100.5 F  *CHILLS WITH OR WITHOUT FEVER  NAUSEA AND VOMITING THAT IS NOT CONTROLLED WITH YOUR NAUSEA MEDICATION  *UNUSUAL SHORTNESS OF BREATH  *UNUSUAL BRUISING OR BLEEDING  TENDERNESS IN MOUTH AND THROAT WITH OR WITHOUT PRESENCE OF ULCERS  *URINARY PROBLEMS  *BOWEL PROBLEMS  UNUSUAL RASH Items with * indicate a potential emergency and should be followed up as soon as possible.  Feel free to call the clinic should you have any questions or concerns. The clinic phone number is [phone].  Please show the CHEMO ALERT CARD at check-in to the Emergency Department and triage nurse.   

## 2020-11-16 NOTE — Patient Instructions (Signed)

## 2020-11-29 ENCOUNTER — Telehealth: Payer: Self-pay | Admitting: *Deleted

## 2020-11-29 ENCOUNTER — Other Ambulatory Visit: Payer: Self-pay | Admitting: Oncology

## 2020-11-29 MED ORDER — OXYCODONE-ACETAMINOPHEN 10-325 MG PO TABS: 1.0000 | ORAL_TABLET | ORAL | 0 refills | Status: DC | PRN

## 2020-11-29 NOTE — Telephone Encounter (Signed)
Danny Wood called for a refill of percocet.

## 2020-12-05 ENCOUNTER — Other Ambulatory Visit: Payer: Self-pay | Admitting: Oncology

## 2020-12-05 ENCOUNTER — Encounter (HOSPITAL_COMMUNITY): Payer: Self-pay

## 2020-12-05 ENCOUNTER — Other Ambulatory Visit: Payer: Self-pay

## 2020-12-05 ENCOUNTER — Ambulatory Visit (HOSPITAL_COMMUNITY)
Admission: RE | Admit: 2020-12-05 | Discharge: 2020-12-05 | Disposition: A | Discharge status: Home / Self Care | Payer: Medicare Other | Source: Ambulatory Visit | Attending: Oncology | Admitting: Oncology

## 2020-12-05 DIAGNOSIS — C679 Malignant neoplasm of bladder, unspecified: Secondary | ICD-10-CM | POA: Insufficient documentation

## 2020-12-05 DIAGNOSIS — M25551 Pain in right hip: Secondary | ICD-10-CM | POA: Diagnosis present

## 2020-12-05 MED ORDER — HEPARIN SOD (PORK) LOCK FLUSH 100 UNIT/ML IV SOLN
500.0000 [IU] | Freq: Once | INTRAVENOUS | Status: AC
  Administered 2020-12-05: 500 [IU] via INTRAVENOUS

## 2020-12-05 MED ORDER — SODIUM CHLORIDE 0.9 % IV SOLN
INTRAVENOUS | Status: AC
  Filled 2020-12-05: qty 250

## 2020-12-05 MED ORDER — IOHEXOL 300 MG/ML  SOLN
100.0000 mL | Freq: Once | INTRAMUSCULAR | Status: AC | PRN
  Administered 2020-12-05: 100 mL via INTRAVENOUS

## 2020-12-05 MED ORDER — HEPARIN SOD (PORK) LOCK FLUSH 100 UNIT/ML IV SOLN
INTRAVENOUS | Status: AC
  Administered 2020-12-05: 500 [IU]
  Filled 2020-12-05: qty 5

## 2020-12-06 ENCOUNTER — Inpatient Hospital Stay: Payer: Medicare Other | Attending: Oncology | Admitting: Oncology

## 2020-12-06 ENCOUNTER — Inpatient Hospital Stay: Payer: Medicare Other

## 2020-12-06 VITALS — BP 139/69 | HR 71 | Temp 98.4°F | Resp 18 | Ht 74.0 in | Wt 176.9 lb

## 2020-12-06 DIAGNOSIS — I2510 Atherosclerotic heart disease of native coronary artery without angina pectoris: Secondary | ICD-10-CM | POA: Diagnosis not present

## 2020-12-06 DIAGNOSIS — C679 Malignant neoplasm of bladder, unspecified: Secondary | ICD-10-CM | POA: Diagnosis present

## 2020-12-06 DIAGNOSIS — C778 Secondary and unspecified malignant neoplasm of lymph nodes of multiple regions: Secondary | ICD-10-CM | POA: Diagnosis not present

## 2020-12-06 DIAGNOSIS — Z5112 Encounter for antineoplastic immunotherapy: Secondary | ICD-10-CM | POA: Insufficient documentation

## 2020-12-06 DIAGNOSIS — G893 Neoplasm related pain (acute) (chronic): Secondary | ICD-10-CM | POA: Diagnosis not present

## 2020-12-06 DIAGNOSIS — Z79899 Other long term (current) drug therapy: Secondary | ICD-10-CM | POA: Diagnosis not present

## 2020-12-06 DIAGNOSIS — G629 Polyneuropathy, unspecified: Secondary | ICD-10-CM | POA: Insufficient documentation

## 2020-12-06 NOTE — Progress Notes (Signed)
Hematology and Oncology Follow Up Visit  Danny Wood [id number] June 15, 1951 70 y.o. 12/06/2020 1:20 PM Danny Wood, Danny Brow, MD   Principle Diagnosis: 70 year old man with stage IV high-grade urothelial carcinoma of the bladder with metastatic disease to pelvic and abdominal adenopathy documented in 2019.  Prior Therapy:  He underwent a cystoscopy and a TURBT on 05/17/2014.   Neoadjuvant systemic chemotherapy in the form of gemcitabine and cisplatin cycle 1 day 1 is on 06/17/2014. He is S/P two cycles completed in 07/2014.  Therapy tolerated poorly with symptoms of nausea and vomiting and worsening renal function.  He is status post robotic cystoprostatectomy and bilateral lymphadenectomy completed on September 09, 2014.  The final pathology revealed T2N0 without any lymph node involvement.  He developed pelvic adenopathy that was biopsy-proven on Jan 14, 2018 to be metastatic urothelial carcinoma.  Carboplatin and gemcitabine cycle 1 started on 02/17/2018.  He completed 8 cycles of therapy in December 2019.  Pembrolizumab 200 mg every 3 weeks started on April 01, 2019.  He is status post 7 cycles of therapy in December 2020.  Therapy stopped because of of patient preference, excellent clinical response and treatment holiday.  He developed progression of disease in June 2021.   Current therapy: Padcev started on February 11, 2020.  He is currently receiving it at  0.75 mg/kg on day 1 and day 15 of each cycle.  He has completed 10 cycles of therapy.  Interim History: Danny Wood returns today for a follow-up.  Since the last visit, he reports no major changes in his health.  He has noted slight increase in his neuropathy predominantly now affecting his right toes.  He denies any motor neuropathy in his upper extremities.  Still able to attend to most activities of daily living without any decline.  Denies any nausea, vomiting or abdominal  pain.               Medications: Updated on review. Current Outpatient Medications  Medication Sig Dispense Refill  . diphenoxylate-atropine (LOMOTIL) 2.5-0.025 MG tablet 1 to 2 PO QID prn diarrhea (Patient not taking: Reported on 08/31/2020) 45 tablet 2  . furosemide (LASIX) 20 MG tablet Take 2 tablets (40 mg total) by mouth daily. 90 tablet 1  . Insulin Glargine (BASAGLAR KWIKPEN) 100 UNIT/ML Inject 8 Units into the skin every morning. (Patient not taking: Reported on 08/31/2020) 9 mL 2  . insulin lispro (HUMALOG KWIKPEN) 100 UNIT/ML KwikPen Inject 2 Units into the skin daily with supper. (Patient not taking: Reported on 08/31/2020) 1.8 mL 3  . oxyCODONE-acetaminophen (PERCOCET) 10-325 MG tablet Take 1 tablet by mouth every 4 (four) hours as needed for pain. 60 tablet 0  . prochlorperazine (COMPAZINE) 10 MG tablet TAKE 1 TABLET(10 MG) BY MOUTH EVERY 6 HOURS AS NEEDED FOR NAUSEA OR VOMITING 30 tablet 0  . sertraline (ZOLOFT) 25 MG tablet TAKE 1 TABLET(25 MG) BY MOUTH DAILY (Patient not taking: Reported on 08/31/2020) 30 tablet 1   No current facility-administered medications for this visit.     Allergies: No Known Allergies    Physical Exam:   Blood pressure 139/69, pulse 71, temperature 98.4 F (36.9 C), temperature source Tympanic, resp. rate 18, height 6\' 2"  (1.88 m), weight 176 lb 14.4 oz (80.2 kg), SpO2 97 %.  ECOG: 1   General appearance: Comfortable appearing without any discomfort Head: Normocephalic without any trauma Oropharynx: Mucous membranes are moist and pink without any thrush or ulcers. Eyes: Pupils are equal and round  reactive to light. Lymph nodes: No cervical, supraclavicular, inguinal or axillary lymphadenopathy.   Heart:regular rate and rhythm.  S1 and S2 without leg edema. Lung: Clear without any rhonchi or wheezes.  No dullness to percussion. Abdomin: Soft, nontender, nondistended with good bowel sounds.  No  hepatosplenomegaly. Musculoskeletal: No joint deformity or effusion.  Full range of motion noted. Neurological: No deficits noted on motor, sensory and deep tendon reflex exam. Skin: No petechial rash or dryness.  Appeared moist.                .   Lab Results: Lab Results  Component Value Date   WBC 5.4 11/16/2020   HGB 13.9 11/16/2020   HCT 38.9 (L) 11/16/2020   MCV 85.5 11/16/2020   PLT 190 11/16/2020     Chemistry      IMPRESSION: 1. Status post cystoprostatectomy and right lower quadrant ileal conduit urinary diversion. 2. Unchanged post treatment appearance of ill-defined soft tissue at the right aspect of the celiac axis and diaphragmatic crux. No residual enlarged abdominal or pelvic lymph nodes. 3. Unchanged irregular nodularity at the pulmonary apices, most conspicuous nodule in the right apex measuring 1.3 x 10.8 cm, again favoring bland pleuroparenchymal scarring. Attention on follow-up. 4. No evidence of new metastatic disease in the chest, abdomen, or pelvis. 5. Coronary artery disease.   IMPRESSION: 1. Chronic fluctuating subcutaneous edema in the low anterior abdominal wall and right thigh. Compared with the most recent prior study of 4 months ago, the edema in the distal thigh appears improved. This edema is nonspecific and could reflect cellulitis, venous insufficiency or third-spacing. No focal fluid collections identified. 2. Edematous changes also involve the penis, and there are bilateral scrotal hydroceles which appear chronic. See separate pelvic CT report today. 3. No acute osseous findings or significant joint effusions.    Impression and Plan:  70 year old man with:    1. Stage IV high-grade urothelial carcinoma with pelvic adenopathy diagnosed in 2019.  He continues to tolerate Padcev with excellent response at this time.  Imaging studies obtained on April 5 continues to show excellent complete response to therapy at  this time.  Risks and benefits of continuing this treatment versus period of observation and surveillance were reviewed.  Potential long-term complications including peripheral neuropathy, hyperglycemia others were reiterated.  After discussion today, he is agreeable to continue Padcev but we will increase the frequency between treatments to once a month for better tolerance and given his excellent response.  Cycle 11 will be starting on April 21 every 4 weeks.  2.  IV access: Port-A-Cath continues to be in use without any issues.  3.  Weight loss: Resolved at this time continues to eat and gained weight.  4.  Pain: Predominantly pelvic and thigh pain related to his malignancy.  He is currently on Percocets which will be refilled for him at the appropriate time.  5.  Prognosis: His disease is incurable although aggressive measures are warranted given his excellent performance status.  6.  Thigh pain and swelling: Unclear etiology.  CT scan of the thigh showed generalized edema without any clear-cut etiology or exact pathology.  This could be related to venous congestion and lymphatic drainage from his malignancy.  I suggested referral for occupational therapy for lymphedema treatments.  He has opted against this option for the time being and will consider it.  7. Followup: He will follow up in 2 weeks for the start of cycle 11 and in 6 weeks  for the start of cycle 12.  30  minutes were spent on this visit.  The time was dedicated to reviewing his disease status, discussing treatment options and outlining future plan of care.   [name], MD 4/6/20221:20 PM

## 2020-12-07 ENCOUNTER — Inpatient Hospital Stay: Payer: Medicare Other

## 2020-12-19 ENCOUNTER — Other Ambulatory Visit: Payer: Self-pay | Admitting: *Deleted

## 2020-12-19 ENCOUNTER — Telehealth: Payer: Self-pay | Admitting: *Deleted

## 2020-12-19 ENCOUNTER — Other Ambulatory Visit: Payer: Self-pay | Admitting: Oncology

## 2020-12-19 DIAGNOSIS — C679 Malignant neoplasm of bladder, unspecified: Secondary | ICD-10-CM

## 2020-12-19 DIAGNOSIS — I890 Lymphedema, not elsewhere classified: Secondary | ICD-10-CM

## 2020-12-19 MED ORDER — GABAPENTIN 300 MG PO CAPS: 300.0000 mg | ORAL_CAPSULE | Freq: Every day | ORAL | 3 refills | Status: DC

## 2020-12-19 MED ORDER — OXYCODONE-ACETAMINOPHEN 10-325 MG PO TABS: 1.0000 | ORAL_TABLET | ORAL | 0 refills | Status: DC | PRN

## 2020-12-19 NOTE — Telephone Encounter (Signed)
PC to patient, advised him Dr. Alen Blew has sent in a referral to OT, his oxycodone prescription has been refilled.  Also Dr. Alen Blew has sent a prescription for neurontin for him to take at bedtime.  Patient verbalizes understanding.

## 2020-12-19 NOTE — Telephone Encounter (Signed)
-----   Message from Wyatt Portela, MD sent at 12/19/2020 10:34 AM EDT ----- Regarding: RE: Patient concerns I will refer him to OT. I will also send a Rx for Neurontin to take at bed time which will help with pain.   ----- Message ----- From: Rolene Course, RN Sent: 12/19/2020  10:29 AM EDT To: Wyatt Portela, MD Subject: Patient concerns                               This patient called, he is having increasing pain in his legs which has become progressively worse.  He asked about pain medication & I sent you a refill request for his oxycodone.  He states that his toes on his left foot are starting to go numb as well, is just having a difficult time.  He is wanting to know if there is any medication he can take for the swelling.  Per your last office note you had suggested a referral to OT for his edema but he wasn't interested at the time.  Now he says he will try anything.  Please let me know your suggestions.  Thanks.

## 2020-12-21 ENCOUNTER — Inpatient Hospital Stay: Payer: Medicare Other

## 2020-12-21 ENCOUNTER — Other Ambulatory Visit: Payer: Self-pay

## 2020-12-21 VITALS — BP 122/78 | HR 67 | Temp 97.8°F | Resp 18

## 2020-12-21 DIAGNOSIS — C679 Malignant neoplasm of bladder, unspecified: Secondary | ICD-10-CM

## 2020-12-21 DIAGNOSIS — Z95828 Presence of other vascular implants and grafts: Secondary | ICD-10-CM

## 2020-12-21 DIAGNOSIS — Z5112 Encounter for antineoplastic immunotherapy: Secondary | ICD-10-CM | POA: Diagnosis not present

## 2020-12-21 LAB — CBC WITH DIFFERENTIAL (CANCER CENTER ONLY)
Abs Immature Granulocytes: 0.01 10*3/uL (ref 0.00–0.07)
Basophils Absolute: 0 10*3/uL (ref 0.0–0.1)
Basophils Relative: 0 %
Eosinophils Absolute: 0.1 10*3/uL (ref 0.0–0.5)
Eosinophils Relative: 1 %
HCT: 38.2 % — ABNORMAL LOW (ref 39.0–52.0)
Hemoglobin: 13.1 g/dL (ref 13.0–17.0)
Immature Granulocytes: 0 %
Lymphocytes Relative: 30 %
Lymphs Abs: 1.9 10*3/uL (ref 0.7–4.0)
MCH: 30 pg (ref 26.0–34.0)
MCHC: 34.3 g/dL (ref 30.0–36.0)
MCV: 87.6 fL (ref 80.0–100.0)
Monocytes Absolute: 0.6 10*3/uL (ref 0.1–1.0)
Monocytes Relative: 9 %
Neutro Abs: 3.6 10*3/uL (ref 1.7–7.7)
Neutrophils Relative %: 60 %
Platelet Count: 207 10*3/uL (ref 150–400)
RBC: 4.36 MIL/uL (ref 4.22–5.81)
RDW: 13.2 % (ref 11.5–15.5)
WBC Count: 6.1 10*3/uL (ref 4.0–10.5)
nRBC: 0 % (ref 0.0–0.2)

## 2020-12-21 LAB — CMP (CANCER CENTER ONLY)
ALT: 27 U/L (ref 0–44)
AST: 32 U/L (ref 15–41)
Albumin: 3.6 g/dL (ref 3.5–5.0)
Alkaline Phosphatase: 66 U/L (ref 38–126)
Anion gap: 8 (ref 5–15)
BUN: 17 mg/dL (ref 8–23)
CO2: 29 mmol/L (ref 22–32)
Calcium: 8.7 mg/dL — ABNORMAL LOW (ref 8.9–10.3)
Chloride: 103 mmol/L (ref 98–111)
Creatinine: 1.05 mg/dL (ref 0.61–1.24)
GFR, Estimated: >60 mL/min (ref >60)
Glucose, Bld: 131 mg/dL — ABNORMAL HIGH (ref 70–99)
Potassium: 4.6 mmol/L (ref 3.5–5.1)
Sodium: 140 mmol/L (ref 135–145)
Total Bilirubin: 0.5 mg/dL (ref 0.3–1.2)
Total Protein: 6.5 g/dL (ref 6.5–8.1)

## 2020-12-21 MED ORDER — HEPARIN SOD (PORK) LOCK FLUSH 100 UNIT/ML IV SOLN
500.0000 [IU] | Freq: Once | INTRAVENOUS | Status: DC | PRN
  Filled 2020-12-21: qty 5

## 2020-12-21 MED ORDER — SODIUM CHLORIDE 0.9 % IV SOLN
0.8000 mg/kg | Freq: Once | INTRAVENOUS | Status: AC
  Administered 2020-12-21: 60 mg via INTRAVENOUS
  Filled 2020-12-21: qty 6

## 2020-12-21 MED ORDER — SODIUM CHLORIDE 0.9% FLUSH
10.0000 mL | INTRAVENOUS | Status: DC | PRN
  Filled 2020-12-21: qty 10

## 2020-12-21 MED ORDER — PROCHLORPERAZINE MALEATE 10 MG PO TABS
ORAL_TABLET | ORAL | Status: AC
  Filled 2020-12-21: qty 1

## 2020-12-21 MED ORDER — SODIUM CHLORIDE 0.9% FLUSH
10.0000 mL | Freq: Once | INTRAVENOUS | Status: AC
  Administered 2020-12-21: 10 mL
  Filled 2020-12-21: qty 10

## 2020-12-21 MED ORDER — DEXAMETHASONE SODIUM PHOSPHATE 100 MG/10ML IJ SOLN
10.0000 mg | Freq: Once | INTRAMUSCULAR | Status: AC
  Administered 2020-12-21: 10 mg via INTRAVENOUS
  Filled 2020-12-21: qty 10

## 2020-12-21 MED ORDER — SODIUM CHLORIDE 0.9 % IV SOLN
Freq: Once | INTRAVENOUS | Status: AC
  Filled 2020-12-21: qty 250

## 2020-12-21 MED ORDER — PROCHLORPERAZINE MALEATE 10 MG PO TABS
10.0000 mg | ORAL_TABLET | Freq: Once | ORAL | Status: AC
  Administered 2020-12-21: 10 mg via ORAL

## 2020-12-21 NOTE — Patient Instructions (Signed)
[facility] Discharge Instructions for Patients Receiving Chemotherapy  Today you received the following chemotherapy agents: Padcev   To help prevent nausea and vomiting after your treatment, we encourage you to take your nausea medication as directed.    If you develop nausea and vomiting that is not controlled by your nausea medication, call the clinic.   BELOW ARE SYMPTOMS THAT SHOULD BE REPORTED IMMEDIATELY:  *FEVER GREATER THAN 100.5 F  *CHILLS WITH OR WITHOUT FEVER  NAUSEA AND VOMITING THAT IS NOT CONTROLLED WITH YOUR NAUSEA MEDICATION  *UNUSUAL SHORTNESS OF BREATH  *UNUSUAL BRUISING OR BLEEDING  TENDERNESS IN MOUTH AND THROAT WITH OR WITHOUT PRESENCE OF ULCERS  *URINARY PROBLEMS  *BOWEL PROBLEMS  UNUSUAL RASH Items with * indicate a potential emergency and should be followed up as soon as possible.  Feel free to call the clinic should you have any questions or concerns. The clinic phone number is [phone].  Please show the [facility] [place] at check-in to the Emergency Department and triage nurse.

## 2021-01-02 ENCOUNTER — Other Ambulatory Visit: Payer: Self-pay | Admitting: *Deleted

## 2021-01-02 ENCOUNTER — Other Ambulatory Visit: Payer: Self-pay | Admitting: Oncology

## 2021-01-02 ENCOUNTER — Ambulatory Visit: Payer: Medicare Other | Attending: Oncology | Admitting: Rehabilitation

## 2021-01-02 ENCOUNTER — Encounter: Payer: Self-pay | Admitting: Rehabilitation

## 2021-01-02 ENCOUNTER — Other Ambulatory Visit: Payer: Self-pay

## 2021-01-02 DIAGNOSIS — I890 Lymphedema, not elsewhere classified: Secondary | ICD-10-CM | POA: Diagnosis present

## 2021-01-02 DIAGNOSIS — C679 Malignant neoplasm of bladder, unspecified: Secondary | ICD-10-CM

## 2021-01-02 DIAGNOSIS — M79604 Pain in right leg: Secondary | ICD-10-CM

## 2021-01-02 MED ORDER — OXYCODONE-ACETAMINOPHEN 10-325 MG PO TABS: 1.0000 | ORAL_TABLET | ORAL | 0 refills | Status: DC | PRN

## 2021-01-02 NOTE — Therapy (Signed)
[place], [place], [address] Phone: [phone]   Fax:  [phone]  Physical Therapy Evaluation  Patient Details  Name: Danny Wood MRN: [id number] Date of Birth: 07-05-1951 Referring Provider (PT): Dr. Alen Blew   Encounter Date: 01/02/2021   PT End of Session - 01/02/21 1619    Visit Number 1    Number of Visits 25    Date for PT Re-Evaluation 03/12/21    Authorization Type [employer]    Progress Note Due on Visit 10    PT Start Time 1400    PT Stop Time 1451    PT Time Calculation (min) 51 min    Activity Tolerance Patient tolerated treatment well    Behavior During Therapy [facility] for tasks assessed/performed           Past Medical History:  Diagnosis Date  . At risk for sleep apnea    STOP-BANG= 4       SENT TO PCP 05-16-2014  . bladder ca dx'd 05/17/14   met dz 12/2017  . Bladder disorder   . Hematuria   . History of renal cell carcinoma    2003  S/P  PARTIAL RIGHT NEPHRECTOMY  . Presence of surgical incision    lumbar diskectomy 05-11-2014-  bandage present  . Urgency of urination     Past Surgical History:  Procedure Laterality Date  . CYSTOSCOPY N/A 09/09/2014   Procedure: CYSTOSCOPY WITH INDOCYANINE GREEN DYE INJECTION AND URETHRAL DILATION;  Surgeon: Alexis Frock, MD;  Location: [facility];  Service: Urology;  Laterality: N/A;  . CYSTOSCOPY WITH BIOPSY N/A 05/17/2014   Procedure: CYSTO WITH BLADDER BIOPSY;  Surgeon: Malka So, MD;  Location: [facility];  Service: Urology;  Laterality: N/A;  . EVALUATION UNDER ANESTHESIA WITH HEMORRHOIDECTOMY  07/07/2012   Procedure: EXAM UNDER ANESTHESIA WITH HEMORRHOIDECTOMY;  Surgeon: Joyice Faster. Cornett, MD;  Location: [place];  Service: General;  Laterality: N/A;  . IR IMAGING GUIDED PORT INSERTION  02/20/2018  . LUMBAR MICRODISCECTOMY  05-11-2014   L4 -- L5 (partial)  . PARTIAL NEPHRECTOMY Right 2003  . ROBOT ASSISTED  LAPAROSCOPIC COMPLETE CYSTECT ILEAL CONDUIT N/A 09/09/2014   Procedure: ROBOTIC ASSISTED LAPAROSCOPIC COMPLETE CYSTOPROSTATECTOMY,  ILEAL CONDUIT;  Surgeon: Alexis Frock, MD;  Location: [facility];  Service: Urology;  Laterality: N/A;    There were no vitals filed for this visit.    Subjective Assessment - 01/02/21 1356    Subjective The leg has been swelling about 1-1.5 years.  It really bother me around 2:30am.  I seem to be tripping over things. I need to rest 2-3 times per day    Pertinent History Stage IV high grade bladder cancer with metastases to pelvic LN.  cystoprostatectomy with ileal conduit by Dr. Tresa Moore in 2016 with chemotherapy completed x 2 courses, disease progression 2021.  Currently on Padcev. Kidney values normal, no heart problems.    Limitations Walking    How long can you walk comfortably? I get pain without my pain medication and if the swelling gets worse    Diagnostic tests Recent CT showing general edema of the Rt leg, abdomen, and scrotum    Patient Stated Goals what to do for the swelling    Currently in Pain? Yes    Pain Score 5     Pain Location Leg   the knee cap and the ankle and foot   Pain Orientation Right    Pain Descriptors / Indicators  Aching;Burning    Pain Type Chronic pain    Pain Onset More than a month ago    Pain Frequency Constant    Aggravating Factors  movement    Pain Relieving Factors sit down or lay down              [facility] PT Assessment - 01/02/21 0001      Assessment   Medical Diagnosis bladder cancer    Referring Provider (PT) Dr. Alen Blew    Onset Date/Surgical Date 05/17/14    Hand Dominance Right    Prior Therapy no      Precautions   Precaution Comments lymphedema Rt LE      Balance Screen   Has the patient fallen in the past 6 months No    Has the patient had a decrease in activity level because of a fear of falling?  Yes    Is the patient reluctant to leave their home because of a fear of falling?  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, children's, things around the house      Cognition   Overall Cognitive Status Within Functional Limits for tasks assessed      Observation/Other Assessments   Observations Rt leg and scrotum larger under jeans      Sensation   Additional Comments numbness whole Rt leg and left toes 4 and 5      Functional Tests   Functional tests Sit to Stand      Ambulation/Gait   Gait Comments antalgic gait             LYMPHEDEMA/ONCOLOGY QUESTIONNAIRE - 01/02/21 0001      Type   Cancer Type bladder      Treatment   Active Chemotherapy Treatment Yes    Past Chemotherapy Treatment Yes    Active Radiation Treatment No    Past Radiation Treatment No      What other symptoms do you have   Are you Having Heaviness or Tightness Yes    Are you having Pain Yes    Are you having pitting edema Yes    Body Site Rt LE from anterior ankle to the knee      Lymphedema Assessments   Lymphedema Assessments Lower extremities      Right Lower Extremity Lymphedema   30 cm Proximal to Suprapatella 54.2 cm   highest able   20 cm Proximal to Suprapatella 51 cm    10 cm Proximal to Suprapatella 45.9 cm    At Midpatella/Popliteal Crease 40 cm    30 cm Proximal to Floor at Lateral Plantar Foot 35.5 cm    20 cm Proximal to Floor at Lateral Plantar Foot 27.3 1    10  cm Proximal to Floor at Lateral Malleoli 24.5 cm    5 cm Proximal to 1st MTP Joint 24.2 cm    Across MTP Joint 24.5 cm    Around Proximal Great Toe 8.3 cm      Left Lower Extremity Lymphedema   30 cm Proximal to Suprapatella 50 cm   highest able   20 cm Proximal to Suprapatella 46.2 cm    10 cm Proximal to Suprapatella 39.4 cm    At Midpatella/Popliteal Crease 37.2 cm    30 cm Proximal to Floor at  Lateral Plantar Foot 31.8 cm    20 cm  Proximal to Floor at Lateral Plantar Foot 23.5 cm    10 cm Proximal to Floor at Lateral Malleoli 22 cm    5 cm Proximal to 1st MTP Joint 23.8 cm    Across MTP Joint 24.8 cm    Around Proximal Great Toe 8.4 cm                   Objective measurements completed on examination: See above findings.               PT Education - 01/02/21 1612    Education Details lymphedema, POC, , flexitouch, garment options    Person(s) Educated Patient    Methods Explanation;Handout    Comprehension Verbalized understanding;Need further instruction               PT Long Term Goals - 01/02/21 1630      PT LONG TERM GOAL #1   Title Pt will be educated on lymphedema diagnosis and risk reduction principles    Time 8    Period Weeks    Status New      PT LONG TERM GOAL #2   Title Pt will obtain appropriate compression garments for the Rt LE    Time 8    Period Weeks    Status New      PT LONG TERM GOAL #3   Title Pt will be ind with self MLD and/or use of compression pump    Time 8    Period Weeks      PT LONG TERM GOAL #4   Title Pt will be educated on genital edema principles and self care    Time 8    Period Weeks    Status New                  Plan - 01/02/21 1621    Clinical Impression Statement Pt presents with Rt LE, genital, and abdominal edema after surgery and lymph node removal due to metastatic bladder cancer.  Pt with pitting non fluctuating edema in the Rt leg along with pain from CIPN.  Scrotum edema evident but not measured.  Pt was given education on lymphedema, CDT, and final goals of self MLD vs pump and use of compression garments.  Pt is willing to start CDT for the leg to decrease edema.  No contraindications noted for treatment.    Personal Factors and Comorbidities Age;Comorbidity 2    Comorbidities radiation history, lymph node removal,    Examination-Activity Limitations Locomotion Level    Examination-Participation Restrictions  Yard Work    Stability/Clinical Decision Making Evolving/Moderate complexity    Clinical Decision Making Low    Rehab Potential Good    PT Frequency 3x / week    PT Duration 8 weeks    PT Treatment/Interventions ADLs/Self Care Home Management;Therapeutic exercise;Patient/family education;Manual lymph drainage;Manual techniques;Taping;Compression bandaging    PT Next Visit Plan start Rt LE CDT: education on risk reduction, MLD, compression bandaging with use of compression shorts Rt LE, start talking about garments / cost    Recommended Other Services Pt interested in flexitouch; will send demo when we know pt will tolerate treatment well    Consulted and Agree with Plan of Care Patient           Patient will benefit from skilled therapeutic intervention in order to improve the following deficits and impairments:  Decreased skin integrity,Decreased knowledge of precautions,Increased edema,Difficulty  walking,Pain  Visit Diagnosis: Lymphedema, not elsewhere classified     Problem List Patient Active Problem List   Diagnosis Date Noted  . Dysphagia 03/03/2020  . Nausea and vomiting 03/03/2020  . Hypokalemia 03/03/2020  . Diabetes ([place]) 10/13/2019  . Port-A-Cath in place 02/24/2018  . Goals of care, counseling/discussion 01/28/2018  . Bladder cancer ([place]) 06/01/2014  . Hematuria 05/17/2014  . Lesion of bladder 05/17/2014    Stark Bray 01/02/2021, 4:32 PM  [place], [place], [address] Phone: [phone]   Fax:  [phone]  Name: Danny Wood MRN: [id number] Date of Birth: 09-15-1950

## 2021-01-02 NOTE — Telephone Encounter (Signed)
Patient called - refill of percocet requested

## 2021-01-02 NOTE — Progress Notes (Signed)
Verbal order from Dr.Shadad: Please an order to PT. 10 sessions. Lymphedema evaluation of the right lower extremity.

## 2021-01-18 ENCOUNTER — Other Ambulatory Visit: Payer: Medicare Other

## 2021-01-18 ENCOUNTER — Ambulatory Visit: Payer: Medicare Other | Admitting: Oncology

## 2021-01-18 ENCOUNTER — Ambulatory Visit: Payer: Medicare Other

## 2021-01-23 ENCOUNTER — Other Ambulatory Visit: Payer: Self-pay | Admitting: Oncology

## 2021-01-23 ENCOUNTER — Telehealth: Payer: Self-pay

## 2021-01-23 MED ORDER — OXYCODONE-ACETAMINOPHEN 10-325 MG PO TABS: 1.0000 | ORAL_TABLET | ORAL | 0 refills | Status: DC | PRN

## 2021-01-23 NOTE — Telephone Encounter (Signed)
Pt called and requested refill for Percocet 10-325. Informed pt this request would be routed to Dr Alen Blew. Pt verbalized thanks and understanding.

## 2021-01-24 ENCOUNTER — Ambulatory Visit: Payer: Medicare Other

## 2021-01-24 ENCOUNTER — Other Ambulatory Visit: Payer: Self-pay

## 2021-01-24 DIAGNOSIS — I890 Lymphedema, not elsewhere classified: Secondary | ICD-10-CM

## 2021-01-24 NOTE — Therapy (Signed)
[place], [place], [address] Phone: [phone]   Fax:  [phone]  Physical Therapy Treatment  Patient Details  Name: Danny Wood MRN: [id number] Date of Birth: 22-Jan-1951 Referring Provider (PT): Dr. Alen Blew   Encounter Date: 01/24/2021   PT End of Session - 01/24/21 1724    Visit Number 2    Number of Visits 25    Date for PT Re-Evaluation 03/12/21    Authorization Type [employer]et[employer]    PT Start Time 1500    PT Stop Time 1559    PT Time Calculation (min) 59 min    Activity Tolerance Patient tolerated treatment well    Behavior During Therapy [facility] for tasks assessed/performed           Past Medical History:  Diagnosis Date  . At risk for sleep apnea    STOP-BANG= 4       SENT TO PCP 05-16-2014  . bladder ca dx'd 05/17/14   met dz 12/2017  . Bladder disorder   . Hematuria   . History of renal cell carcinoma    2003  S/P  PARTIAL RIGHT NEPHRECTOMY  . Presence of surgical incision    lumbar diskectomy 05-11-2014-  bandage present  . Urgency of urination     Past Surgical History:  Procedure Laterality Date  . CYSTOSCOPY N/A 09/09/2014   Procedure: CYSTOSCOPY WITH INDOCYANINE GREEN DYE INJECTION AND URETHRAL DILATION;  Surgeon: Alexis Frock, MD;  Location: [facility];  Service: Urology;  Laterality: N/A;  . CYSTOSCOPY WITH BIOPSY N/A 05/17/2014   Procedure: CYSTO WITH BLADDER BIOPSY;  Surgeon: Malka So, MD;  Location[facility];  Service: Urology;  Laterality: N/A;  . EVALUATION UNDER ANESTHESIA WITH HEMORRHOIDECTOMY  07/07/2012   Procedure: EXAM UNDER ANESTHESIA WITH HEMORRHOIDECTOMY;  Surgeon: Joyice Faster. Cornett, MD;  Location: [place];  Service: General;  Laterality: N/A;  . IR IMAGING GUIDED PORT INSERTION  02/20/2018  . LUMBAR MICRODISCECTOMY  05-11-2014   L4 -- L5 (partial)  . PARTIAL NEPHRECTOMY Right 2003  . ROBOT ASSISTED LAPAROSCOPIC COMPLETE CYSTECT ILEAL CONDUIT  N/A 09/09/2014   Procedure: ROBOTIC ASSISTED LAPAROSCOPIC COMPLETE CYSTOPROSTATECTOMY,  ILEAL CONDUIT;  Surgeon: Alexis Frock, MD;  Location: [facility];  Service: Urology;  Laterality: N/A;    There were no vitals filed for this visit.   Subjective Assessment - 01/24/21 1501    Subjective The leg is swollen about the same. have pain in the entire right leg, behind the knee and the toes burn and feel like they are in a vice.  The left foot is starting to feel numb.    Pertinent History Stage IV high grade bladder cancer with metastases to pelvic LN.  cystoprostatectomy with ileal conduit by Dr. Tresa Moore in 2016 with chemotherapy completed x 2 courses, disease progression 2021.  Currently on Padcev. Kidney values normal, no heart problems.    Limitations Walking    How long can you walk comfortably? I get pain without my pain medication and if the swelling gets worse    Diagnostic tests Recent CT showing general edema of the Rt leg, abdomen, and scrotum    Patient Stated Goals what to do for the swelling    Currently in Pain? Yes    Pain Score 5     Pain Location Leg    Pain Orientation Right    Pain Descriptors / Indicators Burning;Aching    Pain Type Chronic pain    Pain  Onset More than a month ago    Pain Frequency Constant                             [facility] Adult PT Treatment/Exercise - 01/24/21 0001      Manual Therapy   Manual therapy comments Pt advised to consider compression shorts to decrease abdominal/scrotal swelling and help hold up wraps.    Manual Lymphatic Drainage (MLD) Short neck, left axillary LN, left inguinal LN's, left inguino-axillary pathway, Left LE starting proximally at lateral thigh, then medial thigh to lateral, and lateral thigh reqorking pathway and moving to the right Lower leg and retracing all steps    Compression Bandaging Med TG soft applied foot to thigh, 2 15 cm artiflext ankle to thigh, 8 cm wrap with figure 8s to foot and ankle, 10 cm wrap  for heel locks, 2-12 cm wraps above ankle to thigh and ended with a 10 cm wrap to remainder of thigh.  TG soft pulled over the top                       PT Long Term Goals - 01/02/21 1630      PT LONG TERM GOAL #1   Title Pt will be educated on lymphedema diagnosis and risk reduction principles    Time 8    Period Weeks    Status New      PT LONG TERM GOAL #2   Title Pt will obtain appropriate compression garments for the Rt LE    Time 8    Period Weeks    Status New      PT LONG TERM GOAL #3   Title Pt will be ind with self MLD and/or use of compression pump    Time 8    Period Weeks      PT LONG TERM GOAL #4   Title Pt will be educated on genital edema principles and self care    Time 8    Period Weeks    Status New                 Plan - 01/24/21 1724    Clinical Impression Statement Initiated MLD to trunk and right LE in supine with legs elevated on big bolster.  Educated pt as MLD was being performed.  Pt was then placed in sitting and TG soft was applied and then foot and ankle were wrapped.  Had pt stand to apply remainder of wraps.  Pt liked the pressure of the wraps and he will try and keep them on until he comes in of Friday.  He was advised to remove them if they slide down or cause increased pain in any way or if his toes swell.  We discussed flexitouch as one aspect of reducing his swelling.  He is very interested but has not had a phone call yet. Pt watched me wrapping and I explained what I was doing.  He felt he could wrap it at home.  Thigh swelling is much worse than the remainder of his leg and there was no toe or foot swelling    Personal Factors and Comorbidities Age;Comorbidity 2    Comorbidities radiation history, lymph node removal,    Examination-Activity Limitations Locomotion Level    Examination-Participation Restrictions Yard Work    Stability/Clinical Decision Making Evolving/Moderate complexity    Rehab Potential Good    PT  Frequency 3x /  week    PT Duration 8 weeks    PT Treatment/Interventions ADLs/Self Care Home Management;Therapeutic exercise;Patient/family education;Manual lymph drainage;Manual techniques;Taping;Compression bandaging    PT Next Visit Plan Check to see if wrap was comfortable and no swelling in toes, Continue MLD, instruct wrapping    Consulted and Agree with Plan of Care Patient           Patient will benefit from skilled therapeutic intervention in order to improve the following deficits and impairments:  Decreased skin integrity,Decreased knowledge of precautions,Increased edema,Difficulty walking,Pain  Visit Diagnosis: Lymphedema, not elsewhere classified     Problem List Patient Active Problem List   Diagnosis Date Noted  . Dysphagia 03/03/2020  . Nausea and vomiting 03/03/2020  . Hypokalemia 03/03/2020  . Diabetes ([place]) 10/13/2019  . Port-A-Cath in place 02/24/2018  . Goals of care, counseling/discussion 01/28/2018  . Bladder cancer ([place]) 06/01/2014  . Hematuria 05/17/2014  . Lesion of bladder 05/17/2014    Claris Pong 01/24/2021, 5:33 PM  [place], [place], [address] Phone: [phone]   Fax:  [phone]  Name: Danny Wood MRN: [id number] Date of Birth: [birth year] Cheral Almas, PT 01/24/21 5:35 PM

## 2021-01-24 NOTE — Patient Instructions (Addendum)
Pt was given illustrated instructions for LE wrapping   .PLEASE KEEP YOUR BANDAGES ON AS LONG AS POSSIBLE TO GET THE BEST SWELLING REDUCTION. Should your bandages become uncomfortable or feel too tight, follow these steps: 1. Elevate your extremity higher than your heart.  2. Try to move your arm or leg joints against the firmness of the bandage to help with moving the fluid and allow the bandages to loosen a bit.  3. If the bandaging is still is too tight, it is ok to carefully remove the top layer.  There will still be more layers under it that can provide compression to your extremity. 4. Finally, if you STILL have significant pain after trying these steps, it is ok to take the bandage off.  Check your skin carefully for any signs of irritation  5. PLEASE bring ALL bandage materials back to your next appointment as we will reuse what we can TAKE CARE OF YOUR BANDAGES SO THEY WILL LAST LONGER AND STAY IN BETTER CONDITION Washing bandages:  Wash periodically using a mild detergent in warm water.  Do not use fabric softener or bleach.  Place bandages in a mesh lingerie bag or in a tied off pillow case and use the gentle cycle of the washing machine or hand wash. If you hand wash, you may want to put them in the spin cycle of your washer to get the extra water out, but make sure you put them in a mesh bag first. Do not wring or stretch them while they are wet.  Drying bandages: Lay the bandages out smoothly on a towel away from direct sunlight or heating sources that can damage the fabric. Rolling bandages in a towel and gently squeezing the towel to remove excess water before laying them out can speed up the process.  If you use a drying rack, place a towel on top of the rack to lay the bandages on.  If they hang down to dry, they fabric could be stretched out and the bandage will lose its compression.   Or, keep bandages in the mesh bag and dry them in the dryer on the low or no heat cycle. Rolling  bandages: Please roll your bandages after drying them so they are ready for your next treatment. If they are rolled too loose, they will be difficult to apply.  If rolled too tight, they can get stretched out.   TAKE CARE OF YOUR SKIN 1. Apply a low pH moisturizing lotion to your skin daily 2. Avoid scratching your skin 3. Treat skin irritations quickly  4. Know the 5 warning signs of infection: redness, pain, warmth to touch, fever and increased swelling.  Call your physician immediately if you notice any of these signs of a possible infection.

## 2021-01-25 ENCOUNTER — Inpatient Hospital Stay: Payer: Medicare Other

## 2021-01-25 ENCOUNTER — Inpatient Hospital Stay: Payer: Medicare Other | Attending: Oncology

## 2021-01-25 ENCOUNTER — Inpatient Hospital Stay (HOSPITAL_BASED_OUTPATIENT_CLINIC_OR_DEPARTMENT_OTHER): Payer: Medicare Other | Admitting: Oncology

## 2021-01-25 VITALS — BP 125/67 | HR 63 | Temp 95.8°F | Resp 17 | Ht 74.0 in | Wt 180.4 lb

## 2021-01-25 DIAGNOSIS — C679 Malignant neoplasm of bladder, unspecified: Secondary | ICD-10-CM | POA: Insufficient documentation

## 2021-01-25 DIAGNOSIS — Z79899 Other long term (current) drug therapy: Secondary | ICD-10-CM | POA: Insufficient documentation

## 2021-01-25 DIAGNOSIS — G893 Neoplasm related pain (acute) (chronic): Secondary | ICD-10-CM | POA: Diagnosis not present

## 2021-01-25 DIAGNOSIS — G629 Polyneuropathy, unspecified: Secondary | ICD-10-CM | POA: Insufficient documentation

## 2021-01-25 DIAGNOSIS — Z95828 Presence of other vascular implants and grafts: Secondary | ICD-10-CM

## 2021-01-25 DIAGNOSIS — Z5112 Encounter for antineoplastic immunotherapy: Secondary | ICD-10-CM | POA: Insufficient documentation

## 2021-01-25 LAB — CBC WITH DIFFERENTIAL (CANCER CENTER ONLY)
Abs Immature Granulocytes: 0.01 10*3/uL (ref 0.00–0.07)
Basophils Absolute: 0 10*3/uL (ref 0.0–0.1)
Basophils Relative: 1 %
Eosinophils Absolute: 0.1 10*3/uL (ref 0.0–0.5)
Eosinophils Relative: 2 %
HCT: 39 % (ref 39.0–52.0)
Hemoglobin: 13.6 g/dL (ref 13.0–17.0)
Immature Granulocytes: 0 %
Lymphocytes Relative: 31 %
Lymphs Abs: 1.7 10*3/uL (ref 0.7–4.0)
MCH: 30.4 pg (ref 26.0–34.0)
MCHC: 34.9 g/dL (ref 30.0–36.0)
MCV: 87.1 fL (ref 80.0–100.0)
Monocytes Absolute: 0.5 10*3/uL (ref 0.1–1.0)
Monocytes Relative: 9 %
Neutro Abs: 3.1 10*3/uL (ref 1.7–7.7)
Neutrophils Relative %: 57 %
Platelet Count: 198 10*3/uL (ref 150–400)
RBC: 4.48 MIL/uL (ref 4.22–5.81)
RDW: 13.2 % (ref 11.5–15.5)
WBC Count: 5.4 10*3/uL (ref 4.0–10.5)
nRBC: 0 % (ref 0.0–0.2)

## 2021-01-25 LAB — CMP (CANCER CENTER ONLY)
ALT: 29 U/L (ref 0–44)
AST: 24 U/L (ref 15–41)
Albumin: 3.4 g/dL — ABNORMAL LOW (ref 3.5–5.0)
Alkaline Phosphatase: 64 U/L (ref 38–126)
Anion gap: 9 (ref 5–15)
BUN: 23 mg/dL (ref 8–23)
CO2: 27 mmol/L (ref 22–32)
Calcium: 8.7 mg/dL — ABNORMAL LOW (ref 8.9–10.3)
Chloride: 104 mmol/L (ref 98–111)
Creatinine: 0.89 mg/dL (ref 0.61–1.24)
GFR, Estimated: >60 mL/min (ref >60)
Glucose, Bld: 165 mg/dL — ABNORMAL HIGH (ref 70–99)
Potassium: 4.2 mmol/L (ref 3.5–5.1)
Sodium: 140 mmol/L (ref 135–145)
Total Bilirubin: 0.3 mg/dL (ref 0.3–1.2)
Total Protein: 6.2 g/dL — ABNORMAL LOW (ref 6.5–8.1)

## 2021-01-25 MED ORDER — PROCHLORPERAZINE MALEATE 10 MG PO TABS
10.0000 mg | ORAL_TABLET | Freq: Once | ORAL | Status: AC
  Administered 2021-01-25: 10 mg via ORAL

## 2021-01-25 MED ORDER — SODIUM CHLORIDE 0.9 % IV SOLN
Freq: Once | INTRAVENOUS | Status: AC
  Filled 2021-01-25: qty 250

## 2021-01-25 MED ORDER — SODIUM CHLORIDE 0.9% FLUSH
10.0000 mL | Freq: Once | INTRAVENOUS | Status: AC
Start: 2021-01-25 — End: 2021-01-25
  Administered 2021-01-25: 10 mL
  Filled 2021-01-25: qty 10

## 2021-01-25 MED ORDER — PROCHLORPERAZINE MALEATE 10 MG PO TABS
ORAL_TABLET | ORAL | Status: AC
  Filled 2021-01-25: qty 1

## 2021-01-25 MED ORDER — SODIUM CHLORIDE 0.9 % IV SOLN
0.8000 mg/kg | Freq: Once | INTRAVENOUS | Status: AC
  Administered 2021-01-25: 60 mg via INTRAVENOUS
  Filled 2021-01-25: qty 6

## 2021-01-25 MED ORDER — DEXAMETHASONE SODIUM PHOSPHATE 100 MG/10ML IJ SOLN
10.0000 mg | Freq: Once | INTRAMUSCULAR | Status: AC
  Administered 2021-01-25: 10 mg via INTRAVENOUS
  Filled 2021-01-25: qty 10

## 2021-01-25 MED ORDER — HEPARIN SOD (PORK) LOCK FLUSH 100 UNIT/ML IV SOLN
500.0000 [IU] | Freq: Once | INTRAVENOUS | Status: AC | PRN
  Administered 2021-01-25: 500 [IU]
  Filled 2021-01-25: qty 5

## 2021-01-25 MED ORDER — SODIUM CHLORIDE 0.9% FLUSH
10.0000 mL | INTRAVENOUS | Status: DC | PRN
  Administered 2021-01-25: 10 mL
  Filled 2021-01-25: qty 10

## 2021-01-25 NOTE — Progress Notes (Signed)
Hematology and Oncology Follow Up Visit  Danny Wood [id number] Jul 28, 1951 70 y.o. 01/25/2021 11:36 AM Danny Wood Danny Wood, Danny Brow, MD   Principle Diagnosis: 70 year old man with bladder cancer diagnosed in 2015.  He developed stage IV high-grade urothelial carcinoma with pelvic and abdominal adenopathy in 2019.  Prior Therapy:  He underwent a cystoscopy and a TURBT on 05/17/2014.   Neoadjuvant systemic chemotherapy in the form of gemcitabine and cisplatin cycle 1 day 1 is on 06/17/2014. He is S/P two cycles completed in 07/2014.  Therapy tolerated poorly with symptoms of nausea and vomiting and worsening renal function.  He is status post robotic cystoprostatectomy and bilateral lymphadenectomy completed on September 09, 2014.  The final pathology revealed T2N0 without any lymph node involvement.  He developed pelvic adenopathy that was biopsy-proven on Jan 14, 2018 to be metastatic urothelial carcinoma.  Carboplatin and gemcitabine cycle 1 started on 02/17/2018.  He completed 8 cycles of therapy in December 2019.  Pembrolizumab 200 mg every 3 weeks started on April 01, 2019.  He is status post 7 cycles of therapy in December 2020.  Therapy stopped because of of patient preference, excellent clinical response and treatment holiday.  He developed progression of disease in June 2021.   Current therapy: Padcev started on February 11, 2020.  He is currently receiving it at  0.75 mg/kg on day 1 and day 15 of each cycle.  He is here for cycle 12.  Starting with cycle 11 he has been receiving treatment once a month for better tolerance.   Interim History: Danny Wood presents today for return evaluation.  Since last visit, he reports no major changes in his health.  He continues to tolerate Padcev without any major complaints.  He does have very faint sensory neuropathy in his lower extremity but otherwise no other complaints.  He is appetite performance status quality of life remains  intact.  Continues to have issues with lymphedema in the right leg and currently seeing occupational therapy regarding that.               Medications: Reviewed without changes. Current Outpatient Medications  Medication Sig Dispense Refill  . diphenoxylate-atropine (LOMOTIL) 2.5-0.025 MG tablet 1 to 2 PO QID prn diarrhea 45 tablet 2  . furosemide (LASIX) 20 MG tablet Take 2 tablets (40 mg total) by mouth daily. 90 tablet 1  . gabapentin (NEURONTIN) 300 MG capsule Take 1 capsule (300 mg total) by mouth at bedtime. 90 capsule 3  . Insulin Glargine (BASAGLAR KWIKPEN) 100 UNIT/ML Inject 8 Units into the skin every morning. 9 mL 2  . insulin lispro (HUMALOG KWIKPEN) 100 UNIT/ML KwikPen Inject 2 Units into the skin daily with supper. 1.8 mL 3  . oxyCODONE-acetaminophen (PERCOCET) 10-325 MG tablet Take 1 tablet by mouth every 4 (four) hours as needed for pain. 60 tablet 0  . prochlorperazine (COMPAZINE) 10 MG tablet TAKE 1 TABLET(10 MG) BY MOUTH EVERY 6 HOURS AS NEEDED FOR NAUSEA OR VOMITING 30 tablet 0  . sertraline (ZOLOFT) 25 MG tablet TAKE 1 TABLET(25 MG) BY MOUTH DAILY 30 tablet 1   No current facility-administered medications for this visit.     Allergies: No Known Allergies    Physical Exam:  Blood pressure 125/67, pulse 63, temperature (!) 95.8 F (35.4 C), temperature source Tympanic, resp. rate 17, height 6\' 2"  (1.88 m), weight 180 lb 6.4 oz (81.8 kg), SpO2 100 %.   ECOG: 1    General appearance: Alert, awake without any distress.  Head: Atraumatic without abnormalities Oropharynx: Without any thrush or ulcers. Eyes: No scleral icterus. Lymph nodes: No lymphadenopathy noted in the cervical, supraclavicular, or axillary nodes Heart:regular rate and rhythm, without any murmurs or gallops.   Lung: Clear to auscultation without any rhonchi, wheezes or dullness to percussion. Abdomin: Soft, nontender without any shifting dullness or ascites. Musculoskeletal: Right  thigh swelling noted compared to left.  Unchanged from previous examination. Neurological: No motor or sensory deficits. Skin: No rashes or lesions.               .   Lab Results: Lab Results  Component Value Date   WBC 6.1 12/21/2020   HGB 13.1 12/21/2020   HCT 38.2 (L) 12/21/2020   MCV 87.6 12/21/2020   PLT 207 12/21/2020     Chemistry         Impression and Plan:  70 year old man with:    1.  Bladder cancer diagnosed in 2015.  He subsequently developed stage IV high-grade urothelial carcinoma with pelvic adenopathy in 2019.  His disease status was updated at this time and treatment options were discussed.  He continues on Padcev on a monthly infusion for better tolerance.  Risks and benefits of continuing this regimen were reviewed.  Complications that include neuropathy, hyperglycemia as well as infusion related issues were reiterated.  He is agreeable to continue at this time.  We will update his staging scans in the next 4 months.  2.  IV access: Port-A-Cath remains in use without any issues.  3.  Pain: Related to his malignancy and lymphedema in the leg and pelvis.  Pain is manageable currently.  4.  Prognosis: His disease is incurable but he is experiencing excellent tolerance to the current regimen.  Aggressive measures are warranted at this time.  5.  Lymphedema of the right thigh: Likely related to malignancy and treatment.  He is currently receiving occupational therapy.  6. Followup: In 4 weeks for the start of the next cycle of therapy.  30  minutes were dedicated to this encounter.  The time was spent on reviewing laboratory data, disease status update and future plan of care review.   [name], MD 5/26/202211:36 AM

## 2021-01-25 NOTE — Patient Instructions (Signed)
[facility] MEDICAL [facility]COL[facility]  Discharge Instructions: Thank you for choosing [facility] to provide your oncology and hematology care.   If you have a lab appointment with the Cancer Center, please go directly to the Cancer Center and check in at the registration area.   Wear comfortable clothing and clothing appropriate for easy access to any Portacath or PICC line.   We strive to give you quality time with your provider. You may need to reschedule your appointment if you arrive late (15 or more minutes).  Arriving late affects you and other patients whose appointments are after yours.  Also, if you miss three or more appointments without notifying the office, you may be dismissed from the clinic at the provider's discretion.      For prescription refill requests, have your pharmacy contact our office and allow 72 hours for refills to be completed.    Today you received the following chemotherapy and/or immunotherapy agents: Padcev   To help prevent nausea and vomiting after your treatment, we encourage you to take your nausea medication as directed.  BELOW ARE SYMPTOMS THAT SHOULD BE REPORTED IMMEDIATELY: *FEVER GREATER THAN 100.4 F (38 C) OR HIGHER *CHILLS OR SWEATING *NAUSEA AND VOMITING THAT IS NOT CONTROLLED WITH YOUR NAUSEA MEDICATION *UNUSUAL SHORTNESS OF BREATH *UNUSUAL BRUISING OR BLEEDING *URINARY PROBLEMS (pain or burning when urinating, or frequent urination) *BOWEL PROBLEMS (unusual diarrhea, constipation, pain near the anus) TENDERNESS IN MOUTH AND THROAT WITH OR WITHOUT PRESENCE OF ULCERS (sore throat, sores in mouth, or a toothache) UNUSUAL RASH, SWELLING OR PAIN  UNUSUAL VAGINAL DISCHARGE OR ITCHING   Items with * indicate a potential emergency and should be followed up as soon as possible or go to the Emergency Department if any problems should occur.  Please show the CHEMOTHERAPY ALERT CARD or IMMUNOTHERAPY ALERT CARD at check-in to the  Emergency Department and triage nurse.  Should you have questions after your visit or need to cancel or reschedule your appointment, please contact [facility]  Dept: [phone]  and follow the prompts.  Office hours are 8:00 a.m. to 4:30 p.m. Monday - Friday. Please note that voicemails left after 4:00 p.m. may not be returned until the following business day.  We are closed weekends and major holidays. You have access to a nurse at all times for urgent questions. Please call the main number to the clinic Dept: [phone] and follow the prompts.   For any non-urgent questions, you may also contact your provider using MyChart. We now offer e-Visits for anyone 18 and older to request care online for non-urgent symptoms. For details visit [email or web address].   Also download the MyChart app! Go to the app store, search "MyChart", open the app, select , and log in with your MyChart username and password.  Due to Covid, a mask is required upon entering the hospital/clinic. If you do not have a mask, one will be given to you upon arrival. For doctor visits, patients may have 1 support person aged 18 or older with them. For treatment visits, patients cannot have anyone with them due to current Covid guidelines and our immunocompromised population.   

## 2021-01-26 ENCOUNTER — Encounter: Payer: Self-pay | Admitting: Rehabilitation

## 2021-01-26 ENCOUNTER — Ambulatory Visit: Payer: Medicare Other | Admitting: Rehabilitation

## 2021-01-26 ENCOUNTER — Other Ambulatory Visit: Payer: Self-pay

## 2021-01-26 DIAGNOSIS — I890 Lymphedema, not elsewhere classified: Secondary | ICD-10-CM | POA: Diagnosis not present

## 2021-01-26 NOTE — Therapy (Signed)
[place], [place], [address] Phone: [phone]   Fax:  [phone]  Physical Therapy Treatment  Patient Details  Name: Danny Wood MRN: [id number] Date of Birth: May 20, 1951 Referring Provider (PT): Dr. Alen Blew   Encounter Date: 01/26/2021   PT End of Session - 01/26/21 1211    Visit Number 3    Number of Visits 25    Date for PT Re-Evaluation 03/12/21    PT Start Time 1100    PT Stop Time 1157    PT Time Calculation (min) 57 min    Activity Tolerance Patient tolerated treatment well    Behavior During Therapy [facility] for tasks assessed/performed           Past Medical History:  Diagnosis Date  . At risk for sleep apnea    STOP-BANG= 4       SENT TO PCP 05-16-2014  . bladder ca dx'd 05/17/14   met dz 12/2017  . Bladder disorder   . Hematuria   . History of renal cell carcinoma    2003  S/P  PARTIAL RIGHT NEPHRECTOMY  . Presence of surgical incision    lumbar diskectomy 05-11-2014-  bandage present  . Urgency of urination     Past Surgical History:  Procedure Laterality Date  . CYSTOSCOPY N/A 09/09/2014   Procedure: CYSTOSCOPY WITH INDOCYANINE GREEN DYE INJECTION AND URETHRAL DILATION;  Surgeon: Alexis Frock, MD;  Location: [facility];  Service: Urology;  Laterality: N/A;  . CYSTOSCOPY WITH BIOPSY N/A 05/17/2014   Procedure: CYSTO WITH BLADDER BIOPSY;  Surgeon: Malka So, MD;  Location: [facility];  Service: Urology;  Laterality: N/A;  . EVALUATION UNDER ANESTHESIA WITH HEMORRHOIDECTOMY  07/07/2012   Procedure: EXAM UNDER ANESTHESIA WITH HEMORRHOIDECTOMY;  Surgeon: Joyice Faster. Cornett, MD;  Location: [facility];  Service: General;  Laterality: N/A;  . IR IMAGING GUIDED PORT INSERTION  02/20/2018  . LUMBAR MICRODISCECTOMY  05-11-2014   L4 -- L5 (partial)  . PARTIAL NEPHRECTOMY Right 2003  . ROBOT ASSISTED LAPAROSCOPIC COMPLETE CYSTECT ILEAL CONDUIT N/A 09/09/2014   Procedure:  ROBOTIC ASSISTED LAPAROSCOPIC COMPLETE CYSTOPROSTATECTOMY,  ILEAL CONDUIT;  Surgeon: Alexis Frock, MD;  Location: [facility];  Service: Urology;  Laterality: N/A;    There were no vitals filed for this visit.   Subjective Assessment - 01/26/21 1208    Subjective The bandages were okay. the toes did not swell more    Pertinent History Stage IV high grade bladder cancer with metastases to pelvic LN.  cystoprostatectomy with ileal conduit by Dr. Tresa Moore in 2016 with chemotherapy completed x 2 courses, disease progression 2021.  Currently on Padcev. Kidney values normal, no heart problems.    Currently in Pain? Yes    Pain Score 4     Pain Location Leg    Pain Orientation Right    Pain Descriptors / Indicators Aching;Burning    Pain Type Chronic pain    Pain Onset More than a month ago    Pain Frequency Constant                             [facility] Adult PT Treatment/Exercise - 01/26/21 0001      Manual Therapy   Manual Therapy Edema management    Manual therapy comments Pt did not bring back bandages so he was educated on how to was and to bring all things back next visit.  Edema Management Discussed briefly garment options possibly thigh high with long shorts    Manual Lymphatic Drainage (MLD) Short neck, left axillary LN, left inguinal LN's, left inguino-axillary pathway, Left LE starting proximally at lateral thigh, then medial thigh to lateral, and lateral thigh reqorking pathway and moving to the right Lower leg and retracing all steps then in Rt sidelying posterior leg.    Compression Bandaging Med TG soft applied foot to thigh, 2 15 cm artiflext ankle to thigh, 8 cm wrap with figure 8s to foot and ankle, 10 cm wrap for heel locks, 2-12 cm wraps above ankle to thigh  TG soft pulled over the top                       PT Long Term Goals - 01/02/21 1630      PT LONG TERM GOAL #1   Title Pt will be educated on lymphedema diagnosis and risk reduction  principles    Time 8    Period Weeks    Status New      PT LONG TERM GOAL #2   Title Pt will obtain appropriate compression garments for the Rt LE    Time 8    Period Weeks    Status New      PT LONG TERM GOAL #3   Title Pt will be ind with self MLD and/or use of compression pump    Time 8    Period Weeks      PT LONG TERM GOAL #4   Title Pt will be educated on genital edema principles and self care    Time 8    Period Weeks    Status New                 Plan - 01/26/21 1211    Clinical Impression Statement Pt tolerated bandaging well and took them off this morning to bathe, but then forgot to bring them back.  Pt was given new set as to not be unbandaged over the long weekend.  Pt was educated on how to rewrap the top layer if it starts to fall.  Lower leg/calf edema more evident today with line right above sock.  Continued MLD and bandaging.  Emailed Derek from [employer] regarding options for pump with [place] as well as sent note to Dr. Alen Blew regarding his opinion.    Rehab Potential Good    PT Frequency 3x / week    PT Duration 8 weeks    PT Treatment/Interventions ADLs/Self Care Home Management;Therapeutic exercise;Patient/family education;Manual lymph drainage;Manual techniques;Taping;Compression bandaging    PT Next Visit Plan Continue MLD and start educating pt on self MLD, bandaging without toes and keep reminding pt about shorts and maybe show solidea on [place]    Consulted and Agree with Plan of Care Patient           Patient will benefit from skilled therapeutic intervention in order to improve the following deficits and impairments:     Visit Diagnosis: Lymphedema, not elsewhere classified     Problem List Patient Active Problem List   Diagnosis Date Noted  . Dysphagia 03/03/2020  . Nausea and vomiting 03/03/2020  . Hypokalemia 03/03/2020  . Diabetes ([place]) 10/13/2019  . Port-A-Cath in place 02/24/2018  . Goals of care, counseling/discussion  01/28/2018  . Bladder cancer ([place]) 06/01/2014  . Hematuria 05/17/2014  . Lesion of bladder 05/17/2014    Stark Bray 01/26/2021, 12:15 PM  [facility]  [place], [place], [address] Phone: [phone]   Fax:  [phone]  Name: Danny Wood MRN: [id number] Date of Birth: 1951-08-23

## 2021-01-30 ENCOUNTER — Ambulatory Visit: Payer: Medicare Other

## 2021-01-30 ENCOUNTER — Other Ambulatory Visit: Payer: Self-pay

## 2021-01-30 DIAGNOSIS — I890 Lymphedema, not elsewhere classified: Secondary | ICD-10-CM | POA: Diagnosis not present

## 2021-01-30 NOTE — Patient Instructions (Signed)
Pt was given written instructions for self MLD using right inguino-axillary pathway only

## 2021-01-30 NOTE — Therapy (Signed)
[place], [place], [address] Phone: [phone]   Fax:  [phone]  Physical Therapy Treatment  Patient Details  Name: Danny Wood MRN: [id number] Date of Birth: [birth year] Referring Provider (PT): Dr. Alen Blew   Encounter Date: 01/30/2021   PT End of Session - 01/30/21 1944    Visit Number 4    Number of Visits 25    Date for PT Re-Evaluation 03/12/21    Authorization Type [employer]    PT Start Time 1506    PT Stop Time 1600    PT Time Calculation (min) 54 min    Activity Tolerance Patient tolerated treatment well    Behavior During Therapy [facility] for tasks assessed/performed           Past Medical History:  Diagnosis Date  . At risk for sleep apnea    STOP-BANG= 4       SENT TO PCP 05-16-2014  . bladder ca dx'd 05/17/14   met dz 12/2017  . Bladder disorder   . Hematuria   . History of renal cell carcinoma    2003  S/P  PARTIAL RIGHT NEPHRECTOMY  . Presence of surgical incision    lumbar diskectomy 05-11-2014-  bandage present  . Urgency of urination     Past Surgical History:  Procedure Laterality Date  . CYSTOSCOPY N/A 09/09/2014   Procedure: CYSTOSCOPY WITH INDOCYANINE GREEN DYE INJECTION AND URETHRAL DILATION;  Surgeon: Alexis Frock, MD;  Location: [facility];  Service: Urology;  Laterality: N/A;  . CYSTOSCOPY WITH BIOPSY N/A 05/17/2014   Procedure: CYSTO WITH BLADDER BIOPSY;  Surgeon: Malka So, MD;  Location: [facility];  Service: Urology;  Laterality: N/A;  . EVALUATION UNDER ANESTHESIA WITH HEMORRHOIDECTOMY  07/07/2012   Procedure: EXAM UNDER ANESTHESIA WITH HEMORRHOIDECTOMY;  Surgeon: Joyice Faster. Cornett, MD;  Location: [facility];  Service: General;  Laterality: N/A;  . IR IMAGING GUIDED PORT INSERTION  02/20/2018  . LUMBAR MICRODISCECTOMY  05-11-2014   L4 -- L5 (partial)  . PARTIAL NEPHRECTOMY Right 2003  . ROBOT ASSISTED LAPAROSCOPIC COMPLETE CYSTECT ILEAL CONDUIT  N/A 09/09/2014   Procedure: ROBOTIC ASSISTED LAPAROSCOPIC COMPLETE CYSTOPROSTATECTOMY,  ILEAL CONDUIT;  Surgeon: Alexis Frock, MD;  Location: [facility];  Service: Urology;  Laterality: N/A;    There were no vitals filed for this visit.   Subjective Assessment - 01/30/21 1507    Subjective Still have the numbness and burning in my feet.  My lower leg looks good on the right and I wrapped it today to the thigh only.  (pt did not bring in wraps.  Pt took 2 percocets before coming and pain is better    Pertinent History Stage IV high grade bladder cancer with metastases to pelvic LN.  cystoprostatectomy with ileal conduit by Dr. Tresa Moore in 2016 with chemotherapy completed x 2 courses, disease progression 2021.  Currently on Padcev. Kidney values normal, no heart problems.    Limitations Walking    How long can you walk comfortably? I get pain without my pain medication and if the swelling gets worse    Diagnostic tests Recent CT showing general edema of the Rt leg, abdomen, and scrotum    Patient Stated Goals what to do for the swelling    Currently in Pain? Yes    Pain Score 5     Pain Location Leg    Pain Orientation Right    Pain Descriptors / Indicators Aching;Burning    Pain  Type Chronic pain    Pain Onset More than a month ago    Pain Frequency Constant                 LYMPHEDEMA/ONCOLOGY QUESTIONNAIRE - 01/30/21 0001      Right Lower Extremity Lymphedema   20 cm Proximal to Suprapatella 54 cm    10 cm Proximal to Suprapatella 44.1 cm    At Midpatella/Popliteal Crease 39 cm    30 cm Proximal to Floor at Lateral Plantar Foot 33 cm    20 cm Proximal to Floor at Lateral Plantar Foot 24.1 1    10  cm Proximal to Floor at Lateral Malleoli 23.3 cm                      [facility] Adult PT Treatment/Exercise - 01/30/21 0001      Manual Therapy   Manual therapy comments pt now has 2 sets of bandages but he did not bring back with him    Edema Management discussed compression  shorts and wrote down [place] to check on [place] for prices.  Pt remeasured with good reduction in lower leg but swelling still present   Manual Lymphatic Drainage (MLD) Pt was instructed in self manual lymph drainage with head of table elevated: short neck, 5 diaphragmatic breaths, right axillary and right inguinal LN's,  right anterior inguino-axillary pathway, Right lateral thigh, then medial, and lateral thigh reworking pathways and then right lower leg ant and posterior , lat thigh, and reworking pathways.  Educated pt in technique and sequence    Compression Bandaging pt not wrapped since he did not bring wraps with him.  he had on very wet TG soft and 1 12 cm wrap on his thigh only.  Knee was more swollen.  Advised pt to wrap ankle to thigh                  PT Education - 01/30/21 1943    Education Details Pt was educated in self MLD ro the right LE    Person(s) Educated Patient    Methods Explanation;Demonstration;Handout    Comprehension Returned demonstration;Need further instruction;Tactile cues required;Verbal cues required               PT Long Term Goals - 01/02/21 1630      PT LONG TERM GOAL #1   Title Pt will be educated on lymphedema diagnosis and risk reduction principles    Time 8    Period Weeks    Status New      PT LONG TERM GOAL #2   Title Pt will obtain appropriate compression garments for the Rt LE    Time 8    Period Weeks    Status New      PT LONG TERM GOAL #3   Title Pt will be ind with self MLD and/or use of compression pump    Time 8    Period Weeks      PT LONG TERM GOAL #4   Title Pt will be educated on genital edema principles and self care    Time 8    Period Weeks    Status New                 Plan - 01/30/21 1945    Clinical Impression Statement Pt came in with only a TG soft and 1 12 cm bandage on his thigh and did not bring remaining wraps.  Both wraps were  very wet from perspiration.  I did not issue another set as  he now has 2.  He was remeasured with good reduction in lower leg despite not being wrapped, however, knee and thigh was swollen particularly where wrap had slid down.  He was instructed to continue to wrap from the foot to the thigh and to bring wraps in with him.  He was educated in self MLD to the right LE and right axillary and inguinal LN's and inguino-axillary pathway.  He had noticeable softening of the firmness in his thigh and his skin allowed more stretch in the lateral thigh after MLD.  He did quite well with the technique but will require review.  He did not want to come back again tomorrow and had to cancel his appt for Friday.  He was advised that our schedules are very full and he must schedule out ahead of time. We also discussed compression shorts and I wrote down [place] to look at on [employer] and Comorbidities Age;Comorbidity 2    Comorbidities radiation history, lymph node removal,    Examination-Participation Restrictions Yard Work    Stability/Clinical Decision Making Evolving/Moderate complexity    Rehab Potential Good    PT Frequency 3x / week    PT Duration 8 weeks    PT Treatment/Interventions ADLs/Self Care Home Management;Therapeutic exercise;Patient/family education;Manual lymph drainage;Manual techniques;Taping;Compression bandaging    PT Next Visit Plan Continue MLD and start educating pt on self MLD, bandaging without toes and keep reminding pt about shorts and maybe show solidea on [place]    Consulted and Agree with Plan of Care Patient           Patient will benefit from skilled therapeutic intervention in order to improve the following deficits and impairments:  Decreased skin integrity,Decreased knowledge of precautions,Increased edema,Difficulty walking,Pain  Visit Diagnosis: Lymphedema, not elsewhere classified     Problem List Patient Active Problem List   Diagnosis Date Noted  . Dysphagia 03/03/2020  . Nausea and vomiting  03/03/2020  . Hypokalemia 03/03/2020  . Diabetes ([place]) 10/13/2019  . Port-A-Cath in place 02/24/2018  . Goals of care, counseling/discussion 01/28/2018  . Bladder cancer ([place]) 06/01/2014  . Hematuria 05/17/2014  . Lesion of bladder 05/17/2014    Claris Pong 01/30/2021, 7:52 PM  [facility] [place], [place], [address] Phone: [phone]   Fax:  [phone]  Name: Danny Wood MRN: [id number] Date of Birth: 03-28-1951 Cheral Almas, PT 01/30/21 7:54 PM

## 2021-02-02 ENCOUNTER — Encounter: Payer: Self-pay | Admitting: Oncology

## 2021-02-05 ENCOUNTER — Telehealth: Payer: Self-pay | Admitting: Oncology

## 2021-02-05 NOTE — Telephone Encounter (Signed)
Scheduled per los, patient has been called and notified of upcoming appointments. 

## 2021-02-06 ENCOUNTER — Other Ambulatory Visit: Payer: Self-pay

## 2021-02-06 ENCOUNTER — Ambulatory Visit: Payer: Medicare Other | Attending: Oncology | Admitting: Rehabilitation

## 2021-02-06 DIAGNOSIS — I890 Lymphedema, not elsewhere classified: Secondary | ICD-10-CM | POA: Diagnosis present

## 2021-02-06 NOTE — Therapy (Signed)
[place], [place], [address] Phone: [phone]   Fax:  [phone]  Physical Therapy Treatment  Patient Details  Name: Danny Wood MRN: [id number] Date of Birth: 05/16/1951 Referring Provider (PT): Dr. Alen Blew   Encounter Date: 02/06/2021   PT End of Session - 02/06/21 1712    Visit Number 5    Number of Visits 25    Date for PT Re-Evaluation 03/12/21    PT Start Time 1503    PT Stop Time 1558    PT Time Calculation (min) 55 min    Activity Tolerance Patient tolerated treatment well    Behavior During Therapy [facility] for tasks assessed/performed           Past Medical History:  Diagnosis Date  . At risk for sleep apnea    STOP-BANG= 4       SENT TO PCP 05-16-2014  . bladder ca dx'd 05/17/14   met dz 12/2017  . Bladder disorder   . Hematuria   . History of renal cell carcinoma    2003  S/P  PARTIAL RIGHT NEPHRECTOMY  . Presence of surgical incision    lumbar diskectomy 05-11-2014-  bandage present  . Urgency of urination     Past Surgical History:  Procedure Laterality Date  . CYSTOSCOPY N/A 09/09/2014   Procedure: CYSTOSCOPY WITH INDOCYANINE GREEN DYE INJECTION AND URETHRAL DILATION;  Surgeon: Alexis Frock, MD;  Location: [facility];  Service: Urology;  Laterality: N/A;  . CYSTOSCOPY WITH BIOPSY N/A 05/17/2014   Procedure: CYSTO WITH BLADDER BIOPSY;  Surgeon: Malka So, MD;  Location: [facility];  Service: Urology;  Laterality: N/A;  . EVALUATION UNDER ANESTHESIA WITH HEMORRHOIDECTOMY  07/07/2012   Procedure: EXAM UNDER ANESTHESIA WITH HEMORRHOIDECTOMY;  Surgeon: Joyice Faster. Cornett, MD;  Location: [place];  Service: General;  Laterality: N/A;  . IR IMAGING GUIDED PORT INSERTION  02/20/2018  . LUMBAR MICRODISCECTOMY  05-11-2014   L4 -- L5 (partial)  . PARTIAL NEPHRECTOMY Right 2003  . ROBOT ASSISTED LAPAROSCOPIC COMPLETE CYSTECT ILEAL CONDUIT N/A 09/09/2014   Procedure:  ROBOTIC ASSISTED LAPAROSCOPIC COMPLETE CYSTOPROSTATECTOMY,  ILEAL CONDUIT;  Surgeon: Alexis Frock, MD;  Location[facility];  Service: Urology;  Laterality: N/A;    There were no vitals filed for this visit.   Subjective Assessment - 02/06/21 1505    Subjective I got some things at [employer].  my foot pain in my Lt foot is just really bad.    Pertinent History Stage IV high grade bladder cancer with metastases to pelvic LN.  cystoprostatectomy with ileal conduit by Dr. Tresa Moore in 2016 with chemotherapy completed x 2 courses, disease progression 2021.  Currently on Padcev. Kidney values normal, no heart problems.    Currently in Pain? Yes    Pain Score 6     Pain Location Foot    Pain Orientation Left    Pain Descriptors / Indicators Aching;Burning    Pain Type Chronic pain;Neuropathic pain    Pain Onset More than a month ago    Pain Frequency Constant                             [facility] Adult PT Treatment/Exercise - 02/06/21 0001      Manual Therapy   Edema Management pt arrives wearing under armour full leg compression tights, 1 10-cm foot to knee bandage, a calf compression piece over this  and one thigh compression garment - pt was educated on how all of this togther is not appropriate as it allowed for pooling in the knee region and it was too much compression in the lower leg overall.  Pt was measuring for a size Medium tall exostrong to try as pt has a hard time keeping on appropriate wraps and rewrapping    Manual Lymphatic Drainage (MLD) Pt was instructed in self manual lymph drainage with handout after PT performance of MLD;  head of table elevated: short neck, 5 diaphragmatic breaths, right axillary and right inguinal LN's,  right anterior inguino-axillary pathway, Right lateral thigh, then medial, and lateral thigh reworking pathways and then right lower leg ant and posterior , lat thigh, and reworking pathways.  Educated pt in technique and sequence     Compression Bandaging Pt left wearing compression pants which he prefers                       PT Long Term Goals - 01/02/21 1630      PT LONG TERM GOAL #1   Title Pt will be educated on lymphedema diagnosis and risk reduction principles    Time 8    Period Weeks    Status New      PT LONG TERM GOAL #2   Title Pt will obtain appropriate compression garments for the Rt LE    Time 8    Period Weeks    Status New      PT LONG TERM GOAL #3   Title Pt will be ind with self MLD and/or use of compression pump    Time 8    Period Weeks      PT LONG TERM GOAL #4   Title Pt will be educated on genital edema principles and self care    Time 8    Period Weeks    Status New                 Plan - 02/06/21 1712    Clinical Impression Statement Pt overall prefers his new compression athletic leggings over bandaging feeling that they are doing a good job overall.  Pts leg did look more uniform and cylindrical today so it is not getting any worse.  Pt was remineded about swell spot for scrotal swelling.  Pt plans on ordering exostrong thigh high which we will see how it is doing next treatment.  Reviewed self MLD which moderate performance using more skin sliding vs stretching and needing review on sequence.  This PT sent a note to Dr. Tresa Moore regarding pump which is now okay with Flexitouch and Dr. Alen Blew.  Pt does not come in for 1 week.    PT Frequency 3x / week    PT Duration 8 weeks    PT Treatment/Interventions ADLs/Self Care Home Management;Therapeutic exercise;Patient/family education;Manual lymph drainage;Manual techniques;Taping;Compression bandaging    PT Next Visit Plan get exostrong? will work on need custom option?, remeasure leg, Continue MLD and self MLD review, remind pt about shorts with garment, have pt watch edema over night to see if night garment is needed    Consulted and Agree with Plan of Care Patient           Patient will benefit from skilled  therapeutic intervention in order to improve the following deficits and impairments:     Visit Diagnosis: Lymphedema, not elsewhere classified     Problem List Patient Active Problem List   Diagnosis  Date Noted  . Dysphagia 03/03/2020  . Nausea and vomiting 03/03/2020  . Hypokalemia 03/03/2020  . Diabetes ([place]) 10/13/2019  . Port-A-Cath in place 02/24/2018  . Goals of care, counseling/discussion 01/28/2018  . Bladder cancer ([place]) 06/01/2014  . Hematuria 05/17/2014  . Lesion of bladder 05/17/2014    Danny Wood 02/06/2021, 5:17 PM  [place], [place], [address] Phone: [phone]   Fax:  [phone]  Name: Danny Wood MRN: [id number] Date of Birth: 11-25-1950

## 2021-02-06 NOTE — Patient Instructions (Addendum)
Self bandaging the leg:   Follow along with this video:  [email or web address]  -OR-  Search in your internet browser: "self bandaging leg MD [place]"   And the video should appear in the video search results "Lymphedema Management: Self bandaging your leg" on YouTube   How to access self MLD videos:  [employer] Videos:  Option 1:  [email or web address]/self-[email or web address]  -Scroll down until you see "Self-MLD Lower Extremity"  -Watch the intro video until you feel comfortable with the material and then continue on to either the Right or the Left extremity   Deep Effective Breath   Standing, sitting, or laying down place both hands on the belly. Take a deep breath IN, expanding the belly; then breath OUT, contracting the belly. Repeat __5__ times. Do __2-3__ sessions per day and before each self massage.  http://[email or web address].exer.us/866   Copyright  VHI. All rights reserved.  Inguinal Nodes to Axilla - Clear   On involved side, at armpit, make _5__ in-place circles. Then from hip proceed in sections to armpit with stationary circles or pumps _5_ times, this is your pathway. Do _1__ time per day.  Copyright  VHI. All rights reserved.  LEG: Knee to Hip - Clear   Pump up outer thigh of involved leg from knee to outer hip. Then do stationary circles from inner to outer thigh, then do outer thigh again. Next, interlace fingers behind knee IF ABLE and make in-place circles. Do _5_ times of each sequence.  Do _1__ time per day.  Copyright  VHI. All rights reserved.  LEG: Ankle to Hip Sweep   Hands on sides of ankle of involved leg, pump _5__ times up both sides of lower leg, then retrace steps up outer thigh to hip as before and back to pathway. Do _2-3_ times. Do __1_ time per day.  Copyright  VHI. All rights reserved.  FOOT: Dorsum of Foot and Toes Massage   One hand on top of foot make _5_ stationary circles or pumps, then either  on top of toes or each individual toe do _5_ pumps. Then retrace all steps pumping back up both sides of lower leg, outer thigh, and then pathway. Finish with what you started with, _5_ circles at involved side arm pit. All _2-3_ times at each sequence. Do _1__ time per day.  Copyright  VHI. All rights reserved.

## 2021-02-08 ENCOUNTER — Ambulatory Visit: Payer: Medicare Other

## 2021-02-13 ENCOUNTER — Telehealth: Payer: Self-pay | Admitting: *Deleted

## 2021-02-13 ENCOUNTER — Ambulatory Visit: Payer: Medicare Other

## 2021-02-13 ENCOUNTER — Other Ambulatory Visit: Payer: Self-pay | Admitting: Oncology

## 2021-02-13 ENCOUNTER — Other Ambulatory Visit: Payer: Self-pay

## 2021-02-13 DIAGNOSIS — I890 Lymphedema, not elsewhere classified: Secondary | ICD-10-CM

## 2021-02-13 MED ORDER — OXYCODONE-ACETAMINOPHEN 10-325 MG PO TABS: 1.0000 | ORAL_TABLET | ORAL | 0 refills | Status: DC | PRN

## 2021-02-13 NOTE — Therapy (Signed)
[place], [place], [address] Phone: [phone]   Fax:  [phone]  Physical Therapy Treatment  Patient Details  Name: Danny Wood MRN: [id number] Date of Birth: Jan 15, 1951 Referring Provider (PT): Dr. Alen Blew   Encounter Date: 02/13/2021   PT End of Session - 02/13/21 1733     Visit Number 6    Number of Visits 25    Date for PT Re-Evaluation 03/12/21    Authorization Type Aet[employer]    PT Start Time 1507    PT Stop Time 1600    PT Time Calculation (min) 53 min    Activity Tolerance Patient tolerated treatment well    Behavior During Therapy [facility] for tasks assessed/performed             Past Medical History:  Diagnosis Date   At risk for sleep apnea    STOP-BANG= 4       SENT TO PCP 05-16-2014   bladder ca dx'd 05/17/14   met dz 12/2017   Bladder disorder    Hematuria    History of renal cell carcinoma    2003  S/P  PARTIAL RIGHT NEPHRECTOMY   Presence of surgical incision    lumbar diskectomy 05-11-2014-  bandage present   Urgency of urination     Past Surgical History:  Procedure Laterality Date   CYSTOSCOPY N/A 09/09/2014   Procedure: CYSTOSCOPY WITH INDOCYANINE GREEN DYE INJECTION AND URETHRAL DILATION;  Surgeon: Alexis Frock, MD;  [facility];  Service: Urology;  Laterality: N/A;   CYSTOSCOPY WITH BIOPSY N/A 05/17/2014   Procedure: CYSTO WITH BLADDER BIOPSY;  Surgeon: Malka So, MD;  Location: [facility];  Service: Urology;  Laterality: N/A;   EVALUATION UNDER ANESTHESIA WITH HEMORRHOIDECTOMY  07/07/2012   Procedure: EXAM UNDER ANESTHESIA WITH HEMORRHOIDECTOMY;  Surgeon: Joyice Faster. Cornett, MD;  Location: [place];  Service: General;  Laterality: N/A;   IR IMAGING GUIDED PORT INSERTION  02/20/2018   LUMBAR MICRODISCECTOMY  05-11-2014   L4 -- L5 (partial)   PARTIAL NEPHRECTOMY Right 2003   ROBOT ASSISTED LAPAROSCOPIC COMPLETE CYSTECT ILEAL CONDUIT N/A  09/09/2014   Procedure: ROBOTIC ASSISTED LAPAROSCOPIC COMPLETE CYSTOPROSTATECTOMY,  ILEAL CONDUIT;  Surgeon: Alexis Frock, MD;  [facility];  Service: Urology;  Laterality: N/A;    There were no vitals filed for this visit.   Subjective Assessment - 02/13/21 1509     Subjective Still having the constant pain in my feet and ankles and right knee.  My daughter ordered  the stocking and I have been wearing it during the day.  It has kept the swelling down below the knee but the thigh still swells.  Took 2 percocet an hour ago for the foot/ankle pain/burning  See MD on the 21st. I have been doing the MLD every day but don't see any changes.    Pertinent History Stage IV high grade bladder cancer with metastases to pelvic LN.  cystoprostatectomy with ileal conduit by Dr. Tresa Moore in 2016 with chemotherapy completed x 2 courses, disease progression 2021.  Currently on Padcev. Kidney values normal, no heart problems.    Limitations Walking    How long can you walk comfortably? I get pain without my pain medication and if the swelling gets worse    Diagnostic tests Recent CT showing general edema of the Rt leg, abdomen, and scrotum    Patient Stated Goals what to do for the swelling    Currently in  Pain? Yes    Pain Score 6     Pain Location Foot    Pain Descriptors / Indicators Aching;Burning    Pain Type Chronic pain;Neuropathic pain    Pain Onset More than a month ago    Pain Frequency Constant    Multiple Pain Sites No                               [facility] Adult PT Treatment/Exercise - 02/13/21 0001       Manual Therapy   Edema Management Pts daughter ordered his stocking but purchased a Doc Miller knee high stocking which he had on today.  He was shown picture of exostrong and explained that he requires a thigh high and would do better with flat knit.  He indicated he would order the proper ones.We discussed bandaging for a week or so as a good option to reduce his  thigh.  He was agreeable and said he would wrap when he got home.  Reminded him to bring wraps with him or wear them in so we can see his wrap.  He indicated that he would.    Manual Lymphatic Drainage (MLD) Pt was instructed in self manual lymph drainage before and during  PT performance of MLD;  head of table elevated: short neck, 5 diaphragmatic breaths, right axillary and right inguinal LN's,  right anterior inguino-axillary pathway, Right lateral thigh, then medial, and lateral thigh reworking pathways and then right lower leg ant and posterior , lat thigh, and reworking pathways.  Educated pt in technique and sequence                         PT Long Term Goals - 01/02/21 1630       PT LONG TERM GOAL #1   Title Pt will be educated on lymphedema diagnosis and risk reduction principles    Time 8    Period Weeks    Status New      PT LONG TERM GOAL #2   Title Pt will obtain appropriate compression garments for the Rt LE    Time 8    Period Weeks    Status New      PT LONG TERM GOAL #3   Title Pt will be ind with self MLD and/or use of compression pump    Time 8    Period Weeks      PT LONG TERM GOAL #4   Title Pt will be educated on genital edema principles and self care    Time 8    Period Weeks    Status New                   Plan - 02/13/21 1735     Clinical Impression Statement Pt came in with new stockings on, however his daughter did not order exostrong thigh highs as he was instructed to do, and ordered knee high Doc Millers instead.  Explained to him that he really needs compression on his thigh to keep swelling down.  He indicated he will order the proper stockings .  We also discussed bandaging to reduce his thigh and he is agreeable to doing so.  He indicated he would wrap when he got home and will wear or bring wraps with him next time.  We reviewed self MLD today and he needs extensive VC's and TC's to do properly.  He is still complaining of  significant foot/ankle pain bilaterally which sounds like neuropathic pain for which he takes percocet, and he is complaining of other joint pain as well.  Suggested he speak with MD about this at his appt on June 21.    Personal Factors and Comorbidities Age;Comorbidity 2    Comorbidities radiation history, lymph node removal,    Examination-Activity Limitations Locomotion Level    Examination-Participation Restrictions Yard Work    Stability/Clinical Decision Making Evolving/Moderate complexity    Rehab Potential Good    PT Frequency 3x / week    PT Duration 8 weeks    PT Treatment/Interventions ADLs/Self Care Home Management;Therapeutic exercise;Patient/family education;Manual lymph drainage;Manual techniques;Taping;Compression bandaging    PT Next Visit Plan Did he order exostrong thigh high, has he been bandaging foot to thigh, MLD, remeasure    Consulted and Agree with Plan of Care Patient             Patient will benefit from skilled therapeutic intervention in order to improve the following deficits and impairments:  Decreased skin integrity, Decreased knowledge of precautions, Increased edema, Difficulty walking, Pain  Visit Diagnosis: Lymphedema, not elsewhere classified     Problem List Patient Active Problem List   Diagnosis Date Noted   Dysphagia 03/03/2020   Nausea and vomiting 03/03/2020   Hypokalemia 03/03/2020   Diabetes ([place]) 10/13/2019   Port-A-Cath in place 02/24/2018   Goals of care, counseling/discussion 01/28/2018   Bladder cancer ([place]) 06/01/2014   Hematuria 05/17/2014   Lesion of bladder 05/17/2014    Elsie Ra Manan Olmo 02/13/2021, 5:43 PM  [place], [place], [address] Phone: [phone]   Fax:  [phone]  Name: Ziaire Hagos Notaro MRN: [id number] Date of Birth: [birth year] Cheral Almas, PT 02/13/21 5:45 PM

## 2021-02-13 NOTE — Telephone Encounter (Signed)
Pt left a message requesting a refill of percocet

## 2021-02-14 ENCOUNTER — Encounter: Payer: Self-pay | Admitting: Oncology

## 2021-02-19 ENCOUNTER — Ambulatory Visit: Payer: Medicare Other

## 2021-02-19 ENCOUNTER — Other Ambulatory Visit: Payer: Self-pay

## 2021-02-19 DIAGNOSIS — I890 Lymphedema, not elsewhere classified: Secondary | ICD-10-CM | POA: Diagnosis not present

## 2021-02-19 NOTE — Therapy (Signed)
[place], [place], [address] Phone: [phone]   Fax:  [phone]  Physical Therapy Treatment  Patient Details  Name: Danny Wood MRN: [id number] Date of Birth: Mar 28, 1951 Referring Provider (PT): Dr. Alen Blew   Encounter Date: 02/19/2021   PT End of Session - 02/19/21 1742     Visit Number 7    Number of Visits 25    Date for PT Re-Evaluation 03/12/21    Authorization Type Aetna    Progress Note Due on Visit 10    PT Start Time 1505    PT Stop Time 1600    PT Time Calculation (min) 55 min    Activity Tolerance Patient tolerated treatment well    Behavior During Therapy [facility] for tasks assessed/performed             Past Medical History:  Diagnosis Date   At risk for sleep apnea    STOP-BANG= 4       SENT TO PCP 05-16-2014   bladder ca dx'd 05/17/14   met dz 12/2017   Bladder disorder    Hematuria    History of renal cell carcinoma    2003  S/P  PARTIAL RIGHT NEPHRECTOMY   Presence of surgical incision    lumbar diskectomy 05-11-2014-  bandage present   Urgency of urination     Past Surgical History:  Procedure Laterality Date   CYSTOSCOPY N/A 09/09/2014   Procedure: CYSTOSCOPY WITH INDOCYANINE GREEN DYE INJECTION AND URETHRAL DILATION;  Surgeon: Alexis Frock, MD;  [facility];  Service: Urology;  Laterality: N/A;   CYSTOSCOPY WITH BIOPSY N/A 05/17/2014   Procedure: CYSTO WITH BLADDER BIOPSY;  Surgeon: Malka So, MD;  Location: [facility];  Service: Urology;  Laterality: N/A;   EVALUATION UNDER ANESTHESIA WITH HEMORRHOIDECTOMY  07/07/2012   Procedure: EXAM UNDER ANESTHESIA WITH HEMORRHOIDECTOMY;  Surgeon: Joyice Faster. Cornett, MD;  Location: [place];  Service: General;  Laterality: N/A;   IR IMAGING GUIDED PORT INSERTION  02/20/2018   LUMBAR MICRODISCECTOMY  05-11-2014   L4 -- L5 (partial)   PARTIAL NEPHRECTOMY Right 2003   ROBOT ASSISTED LAPAROSCOPIC  COMPLETE CYSTECT ILEAL CONDUIT N/A 09/09/2014   Procedure: ROBOTIC ASSISTED LAPAROSCOPIC COMPLETE CYSTOPROSTATECTOMY,  ILEAL CONDUIT;  Surgeon: Alexis Frock, MD;  [facility];  Service: Urology;  Laterality: N/A;    There were no vitals filed for this visit.   Subjective Assessment - 02/19/21 1736     Subjective I bandaged everyday since I was here. I  took it off every night at 8 pm but didn't sleep in it. I am getting very frustrated with it.  I see the Doctor tomorrow.  I just don't think I can do the bandaging for 4 weeks. (pt arrived with under armour tights on), but did bring wraps today    Pertinent History Stage IV high grade bladder cancer with metastases to pelvic LN.  cystoprostatectomy with ileal conduit by Dr. Tresa Moore in 2016 with chemotherapy completed x 2 courses, disease progression 2021.  Currently on Padcev. Kidney values normal, no heart problems.    Limitations Walking    How long can you walk comfortably? I get pain without my pain medication and if the swelling gets worse    Diagnostic tests Recent CT showing general edema of the Rt leg, abdomen, and scrotum    Patient Stated Goals what to do for the swelling    Currently in Pain? Yes  Pain Score 6     Pain Location Foot    Pain Orientation Right    Pain Descriptors / Indicators Aching;Burning    Pain Type Chronic pain;Neuropathic pain    Pain Onset More than a month ago    Pain Frequency Constant                               [facility] Adult PT Treatment/Exercise - 02/19/21 0001       Manual Therapy   Edema Management Lengthy discussion with pt aobut bandages staying on 24/7 and being wrapped once a day, why we like to get swelling down, the importance of reducing/maintaining swelling, showed tribut night, etc. .  We agreed on a short term goal of bandaging for 2 weeks straight 24/7 to try and reduce leg and then wear compression stocking and get tribute.    Compression Bandaging Med TG soft  applied foot to thigh, 2 15 cm artiflext ankle to thigh, 8 cm wrap with figure 8s to foot and ankle, 10 cm wrap for heel locks, 3-12 cm wraps above ankle to thigh  TG soft pulled over the top                    PT Education - 02/19/21 1740     Education Details Lengthy discussion regarding purpose of bandaging, importance of compliance to see results, reasons for keeping swelling down, importance of managing swelling and how stockings versus tribute/wrapping work.  Pt is agreeable with bandaging for 2 weeks following our guidelines  to see if swelling can be reduced.    Person(s) Educated Patient    Methods Explanation    Comprehension Verbalized understanding                 PT Long Term Goals - 01/02/21 1630       PT LONG TERM GOAL #1   Title Pt will be educated on lymphedema diagnosis and risk reduction principles    Time 8    Period Weeks    Status New      PT LONG TERM GOAL #2   Title Pt will obtain appropriate compression garments for the Rt LE    Time 8    Period Weeks    Status New      PT LONG TERM GOAL #3   Title Pt will be ind with self MLD and/or use of compression pump    Time 8    Period Weeks      PT LONG TERM GOAL #4   Title Pt will be educated on genital edema principles and self care    Time 8    Period Weeks    Status New                   Plan - 02/19/21 1801     Clinical Impression Statement Pt. came in with underarmour tights on and a big ridge where stocking was pulled up on leg.  We talked for nearly 40 min about reasons for bandaging, reducing swelling, stockings versus wraps, importance of compliance and how his own approach despite our education has not worked.  He was wrapped by me today and he agrees to try being compliant with bandaging for 2 full weeks 24/7.  We will then have him in his exostrong and get a night time tribute. His leg and thigh are still very swollen.  He seemed  ready to be complaint and give it a  good try.    Personal Factors and Comorbidities Age;Comorbidity 2    Comorbidities radiation history, lymph node removal,    Examination-Activity Limitations Locomotion Level    Examination-Participation Restrictions Yard Work    Stability/Clinical Decision Making Evolving/Moderate complexity    Rehab Potential Good    PT Frequency 3x / week    PT Duration 8 weeks    PT Treatment/Interventions ADLs/Self Care Home Management;Therapeutic exercise;Patient/family education;Manual lymph drainage;Manual techniques;Taping;Compression bandaging    PT Next Visit Plan Continue MLD, Bandaging for 2 full weeks 24/7 starting 02/19/21, then stocking, tribute, ?flexitouch    Consulted and Agree with Plan of Care Patient             Patient will benefit from skilled therapeutic intervention in order to improve the following deficits and impairments:  Decreased skin integrity, Decreased knowledge of precautions, Increased edema, Difficulty walking, Pain  Visit Diagnosis: Lymphedema, not elsewhere classified     Problem List Patient Active Problem List   Diagnosis Date Noted   Dysphagia 03/03/2020   Nausea and vomiting 03/03/2020   Hypokalemia 03/03/2020   Diabetes ([place]) 10/13/2019   Port-A-Cath in place 02/24/2018   Goals of care, counseling/discussion 01/28/2018   Bladder cancer ([place]) 06/01/2014   Hematuria 05/17/2014   Lesion of bladder 05/17/2014    Danny Wood 02/19/2021, 6:05 PM  [place], [place], [address] Phone: [phone]   Fax:  [phone]  Name: Danny Wood MRN: [id number] Date of Birth: 21-Aug-1951 Cheral Almas, PT 02/19/21 6:05 PM

## 2021-02-20 ENCOUNTER — Inpatient Hospital Stay (HOSPITAL_BASED_OUTPATIENT_CLINIC_OR_DEPARTMENT_OTHER): Payer: Medicare Other | Admitting: Oncology

## 2021-02-20 ENCOUNTER — Inpatient Hospital Stay: Payer: Medicare Other

## 2021-02-20 ENCOUNTER — Inpatient Hospital Stay: Payer: Medicare Other | Attending: Oncology

## 2021-02-20 VITALS — BP 114/70 | HR 68 | Temp 96.8°F | Resp 18 | Ht 74.0 in | Wt 182.2 lb

## 2021-02-20 DIAGNOSIS — Z5112 Encounter for antineoplastic immunotherapy: Secondary | ICD-10-CM | POA: Diagnosis not present

## 2021-02-20 DIAGNOSIS — I890 Lymphedema, not elsewhere classified: Secondary | ICD-10-CM | POA: Insufficient documentation

## 2021-02-20 DIAGNOSIS — C679 Malignant neoplasm of bladder, unspecified: Secondary | ICD-10-CM

## 2021-02-20 DIAGNOSIS — Z79899 Other long term (current) drug therapy: Secondary | ICD-10-CM | POA: Insufficient documentation

## 2021-02-20 DIAGNOSIS — G893 Neoplasm related pain (acute) (chronic): Secondary | ICD-10-CM | POA: Insufficient documentation

## 2021-02-20 DIAGNOSIS — G629 Polyneuropathy, unspecified: Secondary | ICD-10-CM | POA: Insufficient documentation

## 2021-02-20 DIAGNOSIS — Z95828 Presence of other vascular implants and grafts: Secondary | ICD-10-CM

## 2021-02-20 LAB — CBC WITH DIFFERENTIAL (CANCER CENTER ONLY)
Abs Immature Granulocytes: 0.01 10*3/uL (ref 0.00–0.07)
Basophils Absolute: 0 10*3/uL (ref 0.0–0.1)
Basophils Relative: 0 %
Eosinophils Absolute: 0.1 10*3/uL (ref 0.0–0.5)
Eosinophils Relative: 1 %
HCT: 37.7 % — ABNORMAL LOW (ref 39.0–52.0)
Hemoglobin: 13.5 g/dL (ref 13.0–17.0)
Immature Granulocytes: 0 %
Lymphocytes Relative: 30 %
Lymphs Abs: 1.7 10*3/uL (ref 0.7–4.0)
MCH: 30.5 pg (ref 26.0–34.0)
MCHC: 35.8 g/dL (ref 30.0–36.0)
MCV: 85.3 fL (ref 80.0–100.0)
Monocytes Absolute: 0.6 10*3/uL (ref 0.1–1.0)
Monocytes Relative: 10 %
Neutro Abs: 3.3 10*3/uL (ref 1.7–7.7)
Neutrophils Relative %: 59 %
Platelet Count: 180 10*3/uL (ref 150–400)
RBC: 4.42 MIL/uL (ref 4.22–5.81)
RDW: 12.8 % (ref 11.5–15.5)
WBC Count: 5.7 10*3/uL (ref 4.0–10.5)
nRBC: 0 % (ref 0.0–0.2)

## 2021-02-20 LAB — CMP (CANCER CENTER ONLY)
ALT: 31 U/L (ref 0–44)
AST: 32 U/L (ref 15–41)
Albumin: 3.7 g/dL (ref 3.5–5.0)
Alkaline Phosphatase: 61 U/L (ref 38–126)
Anion gap: 7 (ref 5–15)
BUN: 27 mg/dL — ABNORMAL HIGH (ref 8–23)
CO2: 27 mmol/L (ref 22–32)
Calcium: 8.5 mg/dL — ABNORMAL LOW (ref 8.9–10.3)
Chloride: 102 mmol/L (ref 98–111)
Creatinine: 1.23 mg/dL (ref 0.61–1.24)
GFR, Estimated: >60 mL/min (ref >60)
Glucose, Bld: 217 mg/dL — ABNORMAL HIGH (ref 70–99)
Potassium: 3.9 mmol/L (ref 3.5–5.1)
Sodium: 136 mmol/L (ref 135–145)
Total Bilirubin: 0.5 mg/dL (ref 0.3–1.2)
Total Protein: 6.4 g/dL — ABNORMAL LOW (ref 6.5–8.1)

## 2021-02-20 MED ORDER — SODIUM CHLORIDE 0.9% FLUSH
10.0000 mL | Freq: Once | INTRAVENOUS | Status: AC
  Administered 2021-02-20: 10 mL
  Filled 2021-02-20: qty 10

## 2021-02-20 MED ORDER — PROCHLORPERAZINE MALEATE 10 MG PO TABS
10.0000 mg | ORAL_TABLET | Freq: Once | ORAL | Status: AC
  Administered 2021-02-20: 10 mg via ORAL

## 2021-02-20 MED ORDER — SODIUM CHLORIDE 0.9% FLUSH
10.0000 mL | INTRAVENOUS | Status: DC | PRN
Start: 2021-02-20 — End: 2021-02-20
  Administered 2021-02-20 (×2): 10 mL
  Filled 2021-02-20: qty 10

## 2021-02-20 MED ORDER — HEPARIN SOD (PORK) LOCK FLUSH 100 UNIT/ML IV SOLN
500.0000 [IU] | Freq: Once | INTRAVENOUS | Status: AC | PRN
Start: 2021-02-20 — End: 2021-02-20
  Administered 2021-02-20: 500 [IU]
  Filled 2021-02-20: qty 5

## 2021-02-20 MED ORDER — DEXAMETHASONE SODIUM PHOSPHATE 100 MG/10ML IJ SOLN
10.0000 mg | Freq: Once | INTRAMUSCULAR | Status: AC
  Administered 2021-02-20: 10 mg via INTRAVENOUS
  Filled 2021-02-20: qty 10

## 2021-02-20 MED ORDER — PROCHLORPERAZINE MALEATE 10 MG PO TABS
ORAL_TABLET | ORAL | Status: AC
  Filled 2021-02-20: qty 1

## 2021-02-20 MED ORDER — SODIUM CHLORIDE 0.9 % IV SOLN
0.8000 mg/kg | Freq: Once | INTRAVENOUS | Status: AC
  Administered 2021-02-20: 60 mg via INTRAVENOUS
  Filled 2021-02-20: qty 6

## 2021-02-20 MED ORDER — SODIUM CHLORIDE 0.9 % IV SOLN
Freq: Once | INTRAVENOUS | Status: AC
  Filled 2021-02-20: qty 250

## 2021-02-20 NOTE — Progress Notes (Signed)
Ok to treat with previous CMP from 01/25/21 per Dr.Shadad.

## 2021-02-20 NOTE — Progress Notes (Signed)
Hematology and Oncology Follow Up Visit  Danny Wood [id number] 1950/09/24 70 y.o. 02/20/2021 12:52 PM Danny Wood, Danny Brow, MD   Principle Diagnosis: 70 year old man with stage IV high-grade urothelial carcinoma of the bladder diagnosed in 2019.  He has with pelvic and abdominal adenopathy after presenting with localized disease in 2015.  Prior Therapy:  He underwent a cystoscopy and a TURBT on 05/17/2014.   Neoadjuvant systemic chemotherapy in the form of gemcitabine and cisplatin cycle 1 day 1 is on 06/17/2014. He is S/P two cycles completed in 07/2014.  Therapy tolerated poorly with symptoms of nausea and vomiting and worsening renal function.  He is status post robotic cystoprostatectomy and bilateral lymphadenectomy completed on September 09, 2014.  The final pathology revealed T2N0 without any lymph node involvement.  He developed pelvic adenopathy that was biopsy-proven on Jan 14, 2018 to be metastatic urothelial carcinoma.  Carboplatin and gemcitabine cycle 1 started on 02/17/2018.  He completed 8 cycles of therapy in December 2019.  Pembrolizumab 200 mg every 3 weeks started on April 01, 2019.  He is status post 7 cycles of therapy in December 2020.  Therapy stopped because of of patient preference, excellent clinical response and treatment holiday.  He developed progression of disease in June 2021.   Current therapy: Padcev started on February 11, 2020.  He is currently receiving it at  0.75 mg/kg on day 1 and day 15 of each cycle.  He is here for cycle 12.  He is currently receiving treatment on a monthly basis starting with cycle 11.  He is here for next cycle of therapy.   Interim History: Danny Wood is here for repeat evaluation.  Since the last visit, he reports no major changes in his health.  He continues to have follow-up right lower extremity swelling and participating physical and occupational therapy for lymphedema.  He had denies any constitutional  symptoms occluding fevers chills or sweats.  His appetite and performance status remains adequate.  He does have faint sensory neuropathy in his left lower extremity.               Medications: Updated on review. Current Outpatient Medications  Medication Sig Dispense Refill   diphenoxylate-atropine (LOMOTIL) 2.5-0.025 MG tablet 1 to 2 PO QID prn diarrhea 45 tablet 2   furosemide (LASIX) 20 MG tablet Take 2 tablets (40 mg total) by mouth daily. 90 tablet 1   gabapentin (NEURONTIN) 300 MG capsule Take 1 capsule (300 mg total) by mouth at bedtime. 90 capsule 3   Insulin Glargine (BASAGLAR KWIKPEN) 100 UNIT/ML Inject 8 Units into the skin every morning. 9 mL 2   insulin lispro (HUMALOG KWIKPEN) 100 UNIT/ML KwikPen Inject 2 Units into the skin daily with supper. 1.8 mL 3   oxyCODONE-acetaminophen (PERCOCET) 10-325 MG tablet Take 1 tablet by mouth every 4 (four) hours as needed for pain. 60 tablet 0   prochlorperazine (COMPAZINE) 10 MG tablet TAKE 1 TABLET(10 MG) BY MOUTH EVERY 6 HOURS AS NEEDED FOR NAUSEA OR VOMITING 30 tablet 0   sertraline (ZOLOFT) 25 MG tablet TAKE 1 TABLET(25 MG) BY MOUTH DAILY 30 tablet 1   No current facility-administered medications for this visit.   Facility-Administered Medications Ordered in Other Visits  Medication Dose Route Frequency Provider Last Rate Last Admin   sodium chloride flush (NS) 0.9 % injection 10 mL  10 mL Intracatheter Once Wyatt Portela, MD         Allergies: No Known Allergies  Physical Exam:  Blood pressure 114/70, pulse 68, temperature (!) 96.8 F (36 C), temperature source Tympanic, resp. rate 18, height 6\' 2"  (1.88 m), weight 182 lb 3.2 oz (82.6 kg), SpO2 97 %.    ECOG: 1   General appearance: Comfortable appearing without any discomfort Head: Normocephalic without any trauma Oropharynx: Mucous membranes are moist and pink without any thrush or ulcers. Eyes: Pupils are equal and round reactive to light. Lymph  nodes: No cervical, supraclavicular, inguinal or axillary lymphadenopathy.   Heart:regular rate and rhythm.  S1 and S2 without leg edema. Lung: Clear without any rhonchi or wheezes.  No dullness to percussion. Abdomin: Soft, nontender, nondistended with good bowel sounds.  No hepatosplenomegaly. Musculoskeletal: No joint deformity or effusion.  Full range of motion noted. Neurological: No deficits noted on motor, sensory and deep tendon reflex exam. Skin: No petechial rash or dryness.  Appeared moist.  Psychiatric: Mood and affect appeared appropriate.                .   Lab Results: Lab Results  Component Value Date   WBC 5.4 01/25/2021   HGB 13.6 01/25/2021   HCT 39.0 01/25/2021   MCV 87.1 01/25/2021   PLT 198 01/25/2021     Chemistry           Impression and Plan:  70 year old man with:    1.  Stage IV high-grade urothelial carcinoma of the bladder with pelvic adenopathy in 2019.  The natural course of his disease was discussed and salvage therapy options were discussed.  He continues to be on Padcev with the reduced dose and schedule given without complications he experiencing.  Alternative treatment options including active surveillance versus traditional chemotherapy.  After discussion today, he is agreeable to continue.  2.  IV access: Port-A-Cath currently in use without any issues.  3.  Pain: Related to his malignancy and manageable overall with pain medication.  He is currently on Neurontin in addition to Percocet.  4.  Prognosis: Therapy remains palliative although his performance status is reasonable and aggressive measures are warranted.   5.  Lymphedema of the right thigh: He continues to use received occupational therapy  6. Followup: In 4 weeks for the next cycle of therapy.  30  minutes were spent on this visit.  The time was dedicated to reviewing his disease status, discussing treatment choices and addressing complications related to  his cancer and cancer therapy.   [name], MD 6/21/202212:52 PM

## 2021-02-20 NOTE — Patient Instructions (Signed)
[facility]  Discharge Instructions: Thank you for choosing [facility] to provide your oncology and hematology care.   If you have a lab appointment with the Cancer Center, please go directly to the Cancer Center and check in at the registration area.   Wear comfortable clothing and clothing appropriate for easy access to any Portacath or PICC line.   We strive to give you quality time with your provider. You may need to reschedule your appointment if you arrive late (15 or more minutes).  Arriving late affects you and other patients whose appointments are after yours.  Also, if you miss three or more appointments without notifying the office, you may be dismissed from the clinic at the provider's discretion.      For prescription refill requests, have your pharmacy contact our office and allow 72 hours for refills to be completed.    Today you received the following chemotherapy and/or immunotherapy agents: Padcev   To help prevent nausea and vomiting after your treatment, we encourage you to take your nausea medication as directed.  BELOW ARE SYMPTOMS THAT SHOULD BE REPORTED IMMEDIATELY: *FEVER GREATER THAN 100.4 F (38 C) OR HIGHER *CHILLS OR SWEATING *NAUSEA AND VOMITING THAT IS NOT CONTROLLED WITH YOUR NAUSEA MEDICATION *UNUSUAL SHORTNESS OF BREATH *UNUSUAL BRUISING OR BLEEDING *URINARY PROBLEMS (pain or burning when urinating, or frequent urination) *BOWEL PROBLEMS (unusual diarrhea, constipation, pain near the anus) TENDERNESS IN MOUTH AND THROAT WITH OR WITHOUT PRESENCE OF ULCERS (sore throat, sores in mouth, or a toothache) UNUSUAL RASH, SWELLING OR PAIN  UNUSUAL VAGINAL DISCHARGE OR ITCHING   Items with * indicate a potential emergency and should be followed up as soon as possible or go to the Emergency Department if any problems should occur.  Please show the CHEMOTHERAPY ALERT CARD or IMMUNOTHERAPY ALERT CARD at check-in to the  Emergency Department and triage nurse.  Should you have questions after your visit or need to cancel or reschedule your appointment, please contact [facility]  Dept: [phone]  and follow the prompts.  Office hours are 8:00 a.m. to 4:30 p.m. Monday - Friday. Please note that voicemails left after 4:00 p.m. may not be returned until the following business day.  We are closed weekends and major holidays. You have access to a nurse at all times for urgent questions. Please call the main number to the clinic Dept: [phone] and follow the prompts.   For any non-urgent questions, you may also contact your provider using MyChart. We now offer e-Visits for anyone 18 and older to request care online for non-urgent symptoms. For details visit [email or web address].   Also download the MyChart app! Go to the app store, search "MyChart", open the app, select [facility], and log in with your MyChart username and password.  Due to Covid, a mask is required upon entering the hospital/clinic. If you do not have a mask, one will be given to you upon arrival. For doctor visits, patients may have 1 support person aged 18 or older with them. For treatment visits, patients cannot have anyone with them due to current Covid guidelines and our immunocompromised population.   

## 2021-02-22 ENCOUNTER — Ambulatory Visit: Payer: Medicare Other | Admitting: Rehabilitation

## 2021-02-22 ENCOUNTER — Other Ambulatory Visit: Payer: Self-pay

## 2021-02-22 ENCOUNTER — Encounter: Payer: Self-pay | Admitting: Rehabilitation

## 2021-02-22 DIAGNOSIS — I890 Lymphedema, not elsewhere classified: Secondary | ICD-10-CM | POA: Diagnosis not present

## 2021-02-22 NOTE — Therapy (Signed)
[place], [place], [address] Phone: [phone]   Fax:  [phone]  Physical Therapy Treatment  Patient Details  Name: Danny Wood MRN: [id number] Date of Birth: 09-14-1950 Referring Provider (PT): Dr. Alen Blew   Encounter Date: 02/22/2021   PT End of Session - 02/22/21 1000     Visit Number 8    Number of Visits 25    Date for PT Re-Evaluation 03/12/21    PT Start Time 0900    PT Stop Time 4081    PT Time Calculation (min) 49 min    Activity Tolerance Patient tolerated treatment well    Behavior During Therapy [facility] for tasks assessed/performed             Past Medical History:  Diagnosis Date   At risk for sleep apnea    STOP-BANG= 4       SENT TO PCP 05-16-2014   bladder ca dx'd 05/17/14   met dz 12/2017   Bladder disorder    Hematuria    History of renal cell carcinoma    2003  S/P  PARTIAL RIGHT NEPHRECTOMY   Presence of surgical incision    lumbar diskectomy 05-11-2014-  bandage present   Urgency of urination     Past Surgical History:  Procedure Laterality Date   CYSTOSCOPY N/A 09/09/2014   Procedure: CYSTOSCOPY WITH INDOCYANINE GREEN DYE INJECTION AND URETHRAL DILATION;  Surgeon: Alexis Frock, MD;  Location: [facility];  Service: Urology;  Laterality: N/A;   CYSTOSCOPY WITH BIOPSY N/A 05/17/2014   Procedure: CYSTO WITH BLADDER BIOPSY;  Surgeon: Malka So, MD;  Location: [facility];  Service: Urology;  Laterality: N/A;   EVALUATION UNDER ANESTHESIA WITH HEMORRHOIDECTOMY  07/07/2012   Procedure: EXAM UNDER ANESTHESIA WITH HEMORRHOIDECTOMY;  Surgeon: Joyice Faster. Cornett, MD;  Location: [facility];  Service: General;  Laterality: N/A;   IR IMAGING GUIDED PORT INSERTION  02/20/2018   LUMBAR MICRODISCECTOMY  05-11-2014   L4 -- L5 (partial)   PARTIAL NEPHRECTOMY Right 2003   ROBOT ASSISTED LAPAROSCOPIC COMPLETE CYSTECT ILEAL CONDUIT N/A 09/09/2014   Procedure: ROBOTIC  ASSISTED LAPAROSCOPIC COMPLETE CYSTOPROSTATECTOMY,  ILEAL CONDUIT;  Surgeon: Alexis Frock, MD;  Location: [facility];  Service: Urology;  Laterality: N/A;    There were no vitals filed for this visit.   Subjective Assessment - 02/22/21 0903     Subjective I have had the wrap on unless i showered or went swimming    Pertinent History Stage IV high grade bladder cancer with metastases to pelvic LN.  cystoprostatectomy with ileal conduit by Dr. Tresa Moore in 2016 with chemotherapy completed x 2 courses, disease progression 2021.  Currently on Padcev. Kidney values normal, no heart problems.    Currently in Pain? Yes    Pain Score 6     Pain Location Foot    Pain Orientation Right    Pain Descriptors / Indicators Aching;Burning    Pain Type Chronic pain;Neuropathic pain                               [facility] Adult PT Treatment/Exercise - 02/22/21 0001       Manual Therapy   Manual Lymphatic Drainage (MLD) performance of MLD;  head of table elevated: short neck, 5 diaphragmatic breaths, right axillary and right inguinal LN's,  right anterior inguino-axillary pathway, Right lateral thigh, then medial, and lateral thigh reworking pathways and then  right lower leg ant and posterior , lat thigh, and reworking pathways.  Educated pt in technique and sequence    Compression Bandaging Med TG soft applied foot to thigh, 2 15 cm artiflext ankle to thigh, 8 cm wrap with figure 8s to foot and ankle, 10 cm wrap for heel locks, 1-12 cm wraps above ankle to thigh  TG soft pulled over the top. Reviewing size difference of wraps and what order they are applied, also overlap and tension principles                         PT Long Term Goals - 01/02/21 1630       PT LONG TERM GOAL #1   Title Pt will be educated on lymphedema diagnosis and risk reduction principles    Time 8    Period Weeks    Status New      PT LONG TERM GOAL #2   Title Pt will obtain appropriate compression  garments for the Rt LE    Time 8    Period Weeks    Status New      PT LONG TERM GOAL #3   Title Pt will be ind with self MLD and/or use of compression pump    Time 8    Period Weeks      PT LONG TERM GOAL #4   Title Pt will be educated on genital edema principles and self care    Time 8    Period Weeks    Status New                   Plan - 02/22/21 1000     Clinical Impression Statement Pt arrived with bandages on rewrapped from last visit in an appropriate manner except for a few slight mistakes.  Reviewed bandaging size of bandges, tension, and underlayer use with pt receptive of feedback.  Continued MLD    PT Frequency 3x / week    PT Duration 8 weeks    PT Treatment/Interventions ADLs/Self Care Home Management;Therapeutic exercise;Patient/family education;Manual lymph drainage;Manual techniques;Taping;Compression bandaging    PT Next Visit Plan remeasure - continue CDT until plateau for measuring    Consulted and Agree with Plan of Care Patient             Patient will benefit from skilled therapeutic intervention in order to improve the following deficits and impairments:     Visit Diagnosis: Lymphedema, not elsewhere classified     Problem List Patient Active Problem List   Diagnosis Date Noted   Dysphagia 03/03/2020   Nausea and vomiting 03/03/2020   Hypokalemia 03/03/2020   Diabetes ([place]) 10/13/2019   Port-A-Cath in place 02/24/2018   Goals of care, counseling/discussion 01/28/2018   Bladder cancer ([place]) 06/01/2014   Hematuria 05/17/2014   Lesion of bladder 05/17/2014    Stark Bray 02/22/2021, 10:02 AM  [place], [place], [address] Phone: [phone]   Fax:  [phone]  Name: Deuntae Kocsis Virden MRN: [id number] Date of Birth: [birth year]

## 2021-02-27 ENCOUNTER — Ambulatory Visit: Payer: Medicare Other

## 2021-02-27 ENCOUNTER — Other Ambulatory Visit: Payer: Self-pay

## 2021-02-27 DIAGNOSIS — I890 Lymphedema, not elsewhere classified: Secondary | ICD-10-CM | POA: Diagnosis not present

## 2021-02-27 NOTE — Therapy (Signed)
[place], [place], [address] Phone: [phone]   Fax:  [phone]  Physical Therapy Treatment  Patient Details  Name: Danny Wood MRN: [id number] Date of Birth: Jun 06, 1951 Referring Provider (PT): Dr. Alen Blew   Encounter Date: 02/27/2021   PT End of Session - 02/27/21 1707     Visit Number 9    Number of Visits 25    Date for PT Re-Evaluation 03/12/21    Authorization Type [employer]    Progress Note Due on Visit 10    PT Start Time 1502    PT Stop Time 1558    PT Time Calculation (min) 56 min    Activity Tolerance Patient tolerated treatment well    Behavior During Therapy [facility] for tasks assessed/performed             Past Medical History:  Diagnosis Date   At risk for sleep apnea    STOP-BANG= 4       SENT TO PCP 05-16-2014   bladder ca dx'd 05/17/14   met dz 12/2017   Bladder disorder    Hematuria    History of renal cell carcinoma    2003  S/P  PARTIAL RIGHT NEPHRECTOMY   Presence of surgical incision    lumbar diskectomy 05-11-2014-  bandage present   Urgency of urination     Past Surgical History:  Procedure Laterality Date   CYSTOSCOPY N/A 09/09/2014   Procedure: CYSTOSCOPY WITH INDOCYANINE GREEN DYE INJECTION AND URETHRAL DILATION;  Surgeon: Alexis Frock, MD;  Location: [facility];  Service: Urology;  Laterality: N/A;   CYSTOSCOPY WITH BIOPSY N/A 05/17/2014   Procedure: CYSTO WITH BLADDER BIOPSY;  Surgeon: Malka So, MD;  Location: [facility];  Service: Urology;  Laterality: N/A;   EVALUATION UNDER ANESTHESIA WITH HEMORRHOIDECTOMY  07/07/2012   Procedure: EXAM UNDER ANESTHESIA WITH HEMORRHOIDECTOMY;  Surgeon: Joyice Faster. Cornett, MD;  Location: [facility];  Service: General;  Laterality: N/A;   IR IMAGING GUIDED PORT INSERTION  02/20/2018   LUMBAR MICRODISCECTOMY  05-11-2014   L4 -- L5 (partial)   PARTIAL NEPHRECTOMY Right 2003   ROBOT ASSISTED LAPAROSCOPIC  COMPLETE CYSTECT ILEAL CONDUIT N/A 09/09/2014   Procedure: ROBOTIC ASSISTED LAPAROSCOPIC COMPLETE CYSTOPROSTATECTOMY,  ILEAL CONDUIT;  Surgeon: Alexis Frock, MD;  Location: [facility];  Service: Urology;  Laterality: N/A;    There were no vitals filed for this visit.   Subjective Assessment - 02/27/21 1500     Subjective I have stayed in the wraps except with swimming.     I have the wrap on now. I take it off at night and put it on in the am.    Pertinent History Stage IV high grade bladder cancer with metastases to pelvic LN.  cystoprostatectomy with ileal conduit by Dr. Tresa Moore in 2016 with chemotherapy completed x 2 courses, disease progression 2021.  Currently on Padcev. Kidney values normal, no heart problems.    Limitations Walking    How long can you walk comfortably? I get pain without my pain medication and if the swelling gets worse    Diagnostic tests Recent CT showing general edema of the Rt leg, abdomen, and scrotum    Patient Stated Goals what to do for the swelling    Currently in Pain? Yes    Pain Score 4    with 2 percocet on board   Pain Location Foot    Pain Orientation Right;Left    Pain Descriptors /  Indicators Aching;Burning    Pain Type Neuropathic pain    Pain Onset More than a month ago    Pain Frequency Constant    Multiple Pain Sites No                   LYMPHEDEMA/ONCOLOGY QUESTIONNAIRE - 02/27/21 0001       Right Lower Extremity Lymphedema   20 cm Proximal to Suprapatella 52.2 cm    10 cm Proximal to Suprapatella 47 cm    At Midpatella/Popliteal Crease 40 cm    30 cm Proximal to Floor at Lateral Plantar Foot 34 cm    20 cm Proximal to Floor at Lateral Plantar Foot 24.5 1    10  cm Proximal to Floor at Lateral Malleoli 21.9 cm    5 cm Proximal to 1st MTP Joint 23.9 cm                        [facility] Adult PT Treatment/Exercise - 02/27/21 0001       Manual Therapy   Edema Management pt remeasured    Manual Lymphatic Drainage (MLD)  performance of MLD;  head of table elevated: short neck, 5 diaphragmatic breaths, right axillary and right inguinal LN's,  right anterior inguino-axillary pathway, Right lateral thigh, then medial, and lateral thigh reworking pathways and then right lower leg ant and posterior , lat thigh, and reworking pathways.    Compression Bandaging New Med TG soft, 2 artiflex ankle to thigh, 8 cm to foot with figure 8's, 10 cm to ankle with heel locks working up leg, 10cm mid calf to mid thigh , 1 12 cm knee to thigh, 1-12 cm lower leg to thigh.                    PT Education - 02/27/21 1706     Education Details Pt educated to try and wear his wraps at night also but not to wrap too late at night.  May remove if having pain or is unable to sleep.  Also discussed laundering of wraps    Person(s) Educated Patient    Methods Explanation    Comprehension Verbalized understanding                 PT Long Term Goals - 01/02/21 1630       PT LONG TERM GOAL #1   Title Pt will be educated on lymphedema diagnosis and risk reduction principles    Time 8    Period Weeks    Status New      PT LONG TERM GOAL #2   Title Pt will obtain appropriate compression garments for the Rt LE    Time 8    Period Weeks    Status New      PT LONG TERM GOAL #3   Title Pt will be ind with self MLD and/or use of compression pump    Time 8    Period Weeks      PT LONG TERM GOAL #4   Title Pt will be educated on genital edema principles and self care    Time 8    Period Weeks    Status New                   Plan - 02/27/21 1709     Clinical Impression Statement Pt came in with bandages but knee was showing through wrap.  He has improved overall with edema,  but still has the most swelling in the thigh.  He has not been wearing the bandages at night and was advised to try and do so. He has been compliant with the wraps during the day. He has not heardf rom flexitouch yet about cost. Advised  pt not to use packing tape all the way around leg and not to put on artiflex since it tears it up.  He verbalized understanding    Personal Factors and Comorbidities Age;Comorbidity 2    Comorbidities radiation history, lymph node removal,    Examination-Activity Limitations Locomotion Level    Examination-Participation Restrictions Yard Work    Stability/Clinical Decision Making Evolving/Moderate complexity    Rehab Potential Good    PT Frequency 3x / week    PT Duration 8 weeks    PT Treatment/Interventions ADLs/Self Care Home Management;Therapeutic exercise;Patient/family education;Manual lymph drainage;Manual techniques;Taping;Compression bandaging    PT Next Visit Plan remeasure - continue CDT until plateau for measuring    Consulted and Agree with Plan of Care Patient             Patient will benefit from skilled therapeutic intervention in order to improve the following deficits and impairments:  Decreased skin integrity, Decreased knowledge of precautions, Increased edema, Difficulty walking, Pain  Visit Diagnosis: Lymphedema, not elsewhere classified     Problem List Patient Active Problem List   Diagnosis Date Noted   Dysphagia 03/03/2020   Nausea and vomiting 03/03/2020   Hypokalemia 03/03/2020   Diabetes ([place]) 10/13/2019   Port-A-Cath in place 02/24/2018   Goals of care, counseling/discussion 01/28/2018   Bladder cancer ([place]) 06/01/2014   Hematuria 05/17/2014   Lesion of bladder 05/17/2014    Elsie Ra Jung Ingerson 02/27/2021, 5:13 PM  [place], [place], [address] Phone: [phone]   Fax:  [phone]  Name: Danny Wood MRN: [id number] Date of Birth: 1950-11-04  Cheral Almas, PT 02/27/21 5:15 PM

## 2021-02-28 ENCOUNTER — Telehealth: Payer: Self-pay | Admitting: *Deleted

## 2021-02-28 ENCOUNTER — Other Ambulatory Visit: Payer: Self-pay | Admitting: *Deleted

## 2021-02-28 NOTE — Telephone Encounter (Signed)
Prescription faxed for Thigh high compression stocking

## 2021-03-02 ENCOUNTER — Other Ambulatory Visit: Payer: Self-pay | Admitting: Oncology

## 2021-03-02 ENCOUNTER — Telehealth: Payer: Self-pay | Admitting: *Deleted

## 2021-03-02 MED ORDER — OXYCODONE-ACETAMINOPHEN 10-325 MG PO TABS: 1.0000 | ORAL_TABLET | ORAL | 0 refills | Status: DC | PRN

## 2021-03-02 NOTE — Telephone Encounter (Signed)
Mr Everard is requesting a refill of percocet

## 2021-03-12 ENCOUNTER — Other Ambulatory Visit: Payer: Self-pay

## 2021-03-12 ENCOUNTER — Ambulatory Visit: Payer: Medicare Other | Attending: Oncology

## 2021-03-12 DIAGNOSIS — I890 Lymphedema, not elsewhere classified: Secondary | ICD-10-CM | POA: Diagnosis present

## 2021-03-12 NOTE — Therapy (Signed)
[place], [place], [address] Phone: [phone]   Fax:  [phone]  Physical Therapy Treatment  Patient Details  Name: Danny Wood MRN: [id number] Date of Birth: Jan 15, 1951 Referring Provider (PT): Dr. Alen Blew   Encounter Date: 03/12/2021   PT End of Session - 03/12/21 1504     Visit Number 10    Number of Visits 22    Date for PT Re-Evaluation 04/23/21    Authorization Type Aetna    PT Start Time 1400    PT Stop Time 1455    PT Time Calculation (min) 55 min    Activity Tolerance Patient tolerated treatment well    Behavior During Therapy [facility] for tasks assessed/performed             Past Medical History:  Diagnosis Date   At risk for sleep apnea    STOP-BANG= 4       SENT TO PCP 05-16-2014   bladder ca dx'd 05/17/14   met dz 12/2017   Bladder disorder    Hematuria    History of renal cell carcinoma    2003  S/P  PARTIAL RIGHT NEPHRECTOMY   Presence of surgical incision    lumbar diskectomy 05-11-2014-  bandage present   Urgency of urination     Past Surgical History:  Procedure Laterality Date   CYSTOSCOPY N/A 09/09/2014   Procedure: CYSTOSCOPY WITH INDOCYANINE GREEN DYE INJECTION AND URETHRAL DILATION;  Surgeon: Alexis Frock, MD;  Location: [facility];  Service: Urology;  Laterality: N/A;   CYSTOSCOPY WITH BIOPSY N/A 05/17/2014   Procedure: CYSTO WITH BLADDER BIOPSY;  Surgeon: Malka So, MD;  Location: [facility];  Service: Urology;  Laterality: N/A;   EVALUATION UNDER ANESTHESIA WITH HEMORRHOIDECTOMY  07/07/2012   Procedure: EXAM UNDER ANESTHESIA WITH HEMORRHOIDECTOMY;  Surgeon: Joyice Faster. Cornett, MD;  Location: [place];  Service: General;  Laterality: N/A;   IR IMAGING GUIDED PORT INSERTION  02/20/2018   LUMBAR MICRODISCECTOMY  05-11-2014   L4 -- L5 (partial)   PARTIAL NEPHRECTOMY Right 2003   ROBOT ASSISTED LAPAROSCOPIC COMPLETE CYSTECT ILEAL CONDUIT N/A  09/09/2014   Procedure: ROBOTIC ASSISTED LAPAROSCOPIC COMPLETE CYSTOPROSTATECTOMY,  ILEAL CONDUIT;  Surgeon: Alexis Frock, MD;  [facility];  Service: Urology;  Laterality: N/A;    There were no vitals filed for this visit.   Subjective Assessment - 03/12/21 1358     Subjective Took wraps off an hour ago and showered.  I have been wearing the wraps on and off at night but mostly wear in the daytime except with swimming.    Pertinent History Stage IV high grade bladder cancer with metastases to pelvic LN.  cystoprostatectomy with ileal conduit by Dr. Tresa Moore in 2016 with chemotherapy completed x 2 courses, disease progression 2021.  Currently on Padcev. Kidney values normal, no heart problems.                   LYMPHEDEMA/ONCOLOGY QUESTIONNAIRE - 03/12/21 0001       Right Lower Extremity Lymphedema   10 cm Proximal to Suprapatella 47.8 cm (P)     At Midpatella/Popliteal Crease 39.5 cm (P)     30 cm Proximal to Floor at Lateral Plantar Foot 35.7 cm (P)     20 cm Proximal to Floor at Lateral Plantar Foot 25.4 1 (P)     10 cm Proximal to Floor at Lateral Malleoli 22.8 cm (P)     5 cm  Proximal to 1st MTP Joint 23.8 cm (P)     Around Proximal Great Toe 8.1 cm (P)                         [facility] Adult PT Treatment/Exercise - 03/12/21 0001       Manual Therapy   Edema Management pt remeasured    Manual Lymphatic Drainage (MLD) performance of MLD;  short neck, 5 diaphragmatic breaths, right axillary and right inguinal LN's,  right anterior inguino-axillary pathway, Right lateral thigh, then medial, and lateral thigh reworking pathways and then right lower leg ant and posterior , lat thigh, and reworking pathways, legs on large bolster    Compression Bandaging Med TG soft, small artiflex foot to ankle, 15 cm comprilan roll  ankle to thigh, 8 cm to foot with figure 8's, 10 cm to ankle with heel locks working up leg, 10cm mid calf to mid thigh , 1 12 cm knee to thigh, 1-12  cm below knee to thigh. 10 cm mid thigh to upper                        PT Long Term Goals - 01/02/21 1630       PT LONG TERM GOAL #1   Title Pt will be educated on lymphedema diagnosis and risk reduction principles    Time 8    Period Weeks    Status New      PT LONG TERM GOAL #2   Title Pt will obtain appropriate compression garments for the Rt LE    Time 8    Period Weeks    Status New      PT LONG TERM GOAL #3   Title Pt will be ind with self MLD and/or use of compression pump    Time 8    Period Weeks      PT LONG TERM GOAL #4   Title Pt will be educated on genital edema principles and self care    Time 8    Period Weeks    Status New                   Plan - 03/12/21 1505     Clinical Impression Statement pt came in unbandaged today, but had removed this afternoon after showering.  Moderate pitting edema noted lower leg to knee.  Pt was remeasured and most measurements today were up from last measuring.  Performed MLD and then wrapped pt but used 15cm comprilan soft to see if it would keep the wraps up better. Added an additional 10 cm at thigh.  Pt will try hard to remain wrapped until Wed, and will wrap again in clinic Wed and have pt measured on Friday for stocking with Marcene Brawn and possibly night tribute if he is willing.    Personal Factors and Comorbidities Age;Comorbidity 2    Comorbidities radiation history, lymph node removal,    Examination-Activity Limitations Locomotion Level    Examination-Participation Restrictions Yard Work    Stability/Clinical Decision Making Evolving/Moderate complexity    Rehab Potential Good    PT Frequency 3x / week   pt to be seen 2-3 times/week prn   PT Duration 6 weeks    PT Treatment/Interventions ADLs/Self Care Home Management;Therapeutic exercise;Patient/family education;Manual lymph drainage;Manual techniques;Taping;Compression bandaging    PT Next Visit Plan remeasure and rewrap next, bandage again and  have measured for stockings on Friday/maybe tribute if willing.  Recommended Other Services flexitouch if approved by MD    Consulted and Agree with Plan of Care Patient             Patient will benefit from skilled therapeutic intervention in order to improve the following deficits and impairments:  Decreased skin integrity, Decreased knowledge of precautions, Increased edema, Difficulty walking, Pain  Visit Diagnosis: Lymphedema, not elsewhere classified     Problem List Patient Active Problem List   Diagnosis Date Noted   Dysphagia 03/03/2020   Nausea and vomiting 03/03/2020   Hypokalemia 03/03/2020   Diabetes ([place]) 10/13/2019   Port-A-Cath in place 02/24/2018   Goals of care, counseling/discussion 01/28/2018   Bladder cancer ([place]) 06/01/2014   Hematuria 05/17/2014   Lesion of bladder 05/17/2014    Elsie Ra Keng Jewel 03/12/2021, 5:08 PM  [place], [place], [address] Phone: [phone]   Fax:  [phone]  Name: Danny Wood MRN: [id number] Date of Birth: [birth year] Cheral Almas, PT 03/12/21 5:12 PM

## 2021-03-14 ENCOUNTER — Other Ambulatory Visit: Payer: Self-pay

## 2021-03-14 ENCOUNTER — Ambulatory Visit: Payer: Medicare Other

## 2021-03-14 DIAGNOSIS — I890 Lymphedema, not elsewhere classified: Secondary | ICD-10-CM

## 2021-03-14 NOTE — Therapy (Signed)
[place], [place], [address] Phone: [phone]   Fax:  [phone]  Physical Therapy Treatment  Patient Details  Name: Danny Wood MRN: [id number] Date of Birth: 1950/09/10 Referring Provider (PT): Dr. Alen Blew   Encounter Date: 03/14/2021   PT End of Session - 03/14/21 1521     Visit Number 11    Number of Visits 22    Date for PT Re-Evaluation 04/23/21    Authorization Type Aetna    PT Start Time 7209    PT Stop Time 1500    PT Time Calculation (min) 58 min    Activity Tolerance Patient tolerated treatment well    Behavior During Therapy [facility] for tasks assessed/performed             Past Medical History:  Diagnosis Date   At risk for sleep apnea    STOP-BANG= 4       SENT TO PCP 05-16-2014   bladder ca dx'd 05/17/14   met dz 12/2017   Bladder disorder    Hematuria    History of renal cell carcinoma    2003  S/P  PARTIAL RIGHT NEPHRECTOMY   Presence of surgical incision    lumbar diskectomy 05-11-2014-  bandage present   Urgency of urination     Past Surgical History:  Procedure Laterality Date   CYSTOSCOPY N/A 09/09/2014   Procedure: CYSTOSCOPY WITH INDOCYANINE GREEN DYE INJECTION AND URETHRAL DILATION;  Surgeon: Alexis Frock, MD;  [facility];  Service: Urology;  Laterality: N/A;   CYSTOSCOPY WITH BIOPSY N/A 05/17/2014   Procedure: CYSTO WITH BLADDER BIOPSY;  Surgeon: Malka So, MD;  Location[facility];  Service: Urology;  Laterality: N/A;   EVALUATION UNDER ANESTHESIA WITH HEMORRHOIDECTOMY  07/07/2012   Procedure: EXAM UNDER ANESTHESIA WITH HEMORRHOIDECTOMY;  Surgeon: Joyice Faster. Cornett, MD;  Location: [place];  Service: General;  Laterality: N/A;   IR IMAGING GUIDED PORT INSERTION  02/20/2018   LUMBAR MICRODISCECTOMY  05-11-2014   L4 -- L5 (partial)   PARTIAL NEPHRECTOMY Right 2003   ROBOT ASSISTED LAPAROSCOPIC COMPLETE CYSTECT ILEAL CONDUIT N/A  09/09/2014   Procedure: ROBOTIC ASSISTED LAPAROSCOPIC COMPLETE CYSTOPROSTATECTOMY,  ILEAL CONDUIT;  Surgeon: Alexis Frock, MD;  [facility];  Service: Urology;  Laterality: N/A;    There were no vitals filed for this visit.   Subjective Assessment - 03/14/21 1401     Subjective When I showered last night the wraps got wet and I woke up in the middle of the night I had to take the scissors and cut the foot part off because it was so painful. It did really well until then    Limitations Walking    How long can you walk comfortably? I get pain without my pain medication and if the swelling gets worse    Diagnostic tests Recent CT showing general edema of the Rt leg, abdomen, and scrotum    Patient Stated Goals what to do for the swelling    Currently in Pain? Yes    Pain Score 5     Pain Location Foot    Pain Descriptors / Indicators Aching;Burning    Pain Type Neuropathic pain    Pain Onset More than a month ago    Pain Frequency Constant    Multiple Pain Sites No  [place] Adult PT Treatment/Exercise - 03/14/21 0001       Manual Therapy   Edema Management wrap removed from pt and re-rolled while he washed his leg. 2 foot wraps had been cut off    Manual Lymphatic Drainage (MLD) performance of MLD;   short neck, 5 diaphragmatic breaths, right axillary and right inguinal LN's,  right anterior inguino-axillary pathway, Right lateral thigh, then medial, and lateral thigh reworking pathways and then right lower leg ant and posterior , lat thigh, and reworking pathways.    Compression Bandaging New Med TG soft, small artiflex foot to ankle, 15 cm comprilan roll  ankle to thigh, 8 cm to foot with figure 8's, 10 cm to ankle with heel locks working up leg, 10cm mid calf to mid thigh , 1 12 cm knee to thigh, 1-12 cm below knee to thigh. Isoband in herring bone to thigh to help hold wrap up                    PT Education - 03/14/21  1520     Education Details pt to try and leave wrap on unless it is uncomfortable or slides down.  Pt will be measured on Friday for compression garments.  Was advised he will need to reapply wraps until his garments come in.  Discussed night tribute as well and pt seems interested,    Person(s) Educated Patient    Methods Explanation    Comprehension Verbalized understanding                 PT Long Term Goals - 01/02/21 1630       PT LONG TERM GOAL #1   Title Pt will be educated on lymphedema diagnosis and risk reduction principles    Time 8    Period Weeks    Status New      PT LONG TERM GOAL #2   Title Pt will obtain appropriate compression garments for the Rt LE    Time 8    Period Weeks    Status New      PT LONG TERM GOAL #3   Title Pt will be ind with self MLD and/or use of compression pump    Time 8    Period Weeks      PT LONG TERM GOAL #4   Title Pt will be educated on genital edema principles and self care    Time 8    Period Weeks    Status New                   Plan - 03/14/21 1522     Clinical Impression Statement Pt was compliant with leaving wraps on but he got them wet and they woke him in the middle of the night being too tight at his foot.  He cut the foot wrap off and left the remainder intact.  when he arrived here the thigh part had slid way down and was bunched up behind his knee. Lower leg looked really good today but upper thigh continues with swelling.  MLD was performed and pt was rewrapped with an isoband at the thigh to try and keep from sliding.  He will come Friday for measuring and to consider tribute night.    Personal Factors and Comorbidities Age;Comorbidity 2    Comorbidities radiation history, lymph node removal,    Examination-Activity Limitations Locomotion Level    Examination-Participation Restrictions Yard Work    Stability/Clinical Decision Making Evolving/Moderate  complexity    Rehab Potential Good    PT  Frequency 3x / week    PT Duration 6 weeks    PT Treatment/Interventions ADLs/Self Care Home Management;Therapeutic exercise;Patient/family education;Manual lymph drainage;Manual techniques;Taping;Compression bandaging    PT Next Visit Plan measure fri for stocking and tribute? pt advised to rewrap until garments come in    Consulted and Agree with Plan of Care Patient             Patient will benefit from skilled therapeutic intervention in order to improve the following deficits and impairments:  Decreased skin integrity, Decreased knowledge of precautions, Increased edema, Difficulty walking, Pain  Visit Diagnosis: Lymphedema, not elsewhere classified     Problem List Patient Active Problem List   Diagnosis Date Noted   Dysphagia 03/03/2020   Nausea and vomiting 03/03/2020   Hypokalemia 03/03/2020   Diabetes ([place]) 10/13/2019   Port-A-Cath in place 02/24/2018   Goals of care, counseling/discussion 01/28/2018   Bladder cancer ([place]) 06/01/2014   Hematuria 05/17/2014   Lesion of bladder 05/17/2014    Elsie Ra Seth Friedlander 03/14/2021, 3:28 PM  [place], [place], [address] Phone: [phone]   Fax:  [phone]  Name: Danny Wood MRN: [id number] Date of Birth: 07/03/1951 Cheral Almas, PT 03/14/21 3:30 PM

## 2021-03-16 ENCOUNTER — Encounter: Payer: Self-pay | Admitting: Rehabilitation

## 2021-03-16 ENCOUNTER — Ambulatory Visit: Payer: Medicare Other | Admitting: Rehabilitation

## 2021-03-16 ENCOUNTER — Other Ambulatory Visit: Payer: Self-pay

## 2021-03-16 DIAGNOSIS — I890 Lymphedema, not elsewhere classified: Secondary | ICD-10-CM | POA: Diagnosis not present

## 2021-03-16 NOTE — Therapy (Signed)
[place], [place], [address] Phone: [phone]   Fax:  [phone]  Physical Therapy Treatment  Patient Details  Name: Danny Wood MRN: [id number] Date of Birth: 1951-06-01 Referring Provider (PT): Dr. Alen Blew   Encounter Date: 03/16/2021   PT End of Session - 03/16/21 1128     Visit Number 12    Number of Visits 22    Date for PT Re-Evaluation 04/23/21    PT Start Time 1030    PT Stop Time 1055    PT Time Calculation (min) 25 min    Activity Tolerance Patient tolerated treatment well    Behavior During Therapy [facility] for tasks assessed/performed             Past Medical History:  Diagnosis Date   At risk for sleep apnea    STOP-BANG= 4       SENT TO PCP 05-16-2014   bladder ca dx'd 05/17/14   met dz 12/2017   Bladder disorder    Hematuria    History of renal cell carcinoma    2003  S/P  PARTIAL RIGHT NEPHRECTOMY   Presence of surgical incision    lumbar diskectomy 05-11-2014-  bandage present   Urgency of urination     Past Surgical History:  Procedure Laterality Date   CYSTOSCOPY N/A 09/09/2014   Procedure: CYSTOSCOPY WITH INDOCYANINE GREEN DYE INJECTION AND URETHRAL DILATION;  Surgeon: Alexis Frock, MD;  [facility];  Service: Urology;  Laterality: N/A;   CYSTOSCOPY WITH BIOPSY N/A 05/17/2014   Procedure: CYSTO WITH BLADDER BIOPSY;  Surgeon: Malka So, MD;  Location: [facility];  Service: Urology;  Laterality: N/A;   EVALUATION UNDER ANESTHESIA WITH HEMORRHOIDECTOMY  07/07/2012   Procedure: EXAM UNDER ANESTHESIA WITH HEMORRHOIDECTOMY;  Surgeon: Joyice Faster. Cornett, MD;  Location: [place];  Service: General;  Laterality: N/A;   IR IMAGING GUIDED PORT INSERTION  02/20/2018   LUMBAR MICRODISCECTOMY  05-11-2014   L4 -- L5 (partial)   PARTIAL NEPHRECTOMY Right 2003   ROBOT ASSISTED LAPAROSCOPIC COMPLETE CYSTECT ILEAL CONDUIT N/A 09/09/2014   Procedure: ROBOTIC  ASSISTED LAPAROSCOPIC COMPLETE CYSTOPROSTATECTOMY,  ILEAL CONDUIT;  Surgeon: Alexis Frock, MD;  [facility];  Service: Urology;  Laterality: N/A;    There were no vitals filed for this visit.   Subjective Assessment - 03/16/21 1125     Subjective Here to be measured today    Pertinent History Stage IV high grade bladder cancer with metastases to pelvic LN.  cystoprostatectomy with ileal conduit by Dr. Tresa Moore in 2016 with chemotherapy completed x 2 courses, disease progression 2021.  Currently on Padcev. Kidney values normal, no heart problems.    Patient Stated Goals what to do for the swelling    Currently in Pain? No/denies    Pain Score 5     Pain Location Foot    Pain Orientation Right;Left    Pain Descriptors / Indicators Burning;Aching    Pain Type Neuropathic pain    Pain Onset More than a month ago    Pain Frequency Constant                               [facility] Adult PT Treatment/Exercise - 03/16/21 0001       Prosthetics   Prosthetic Care Comments  Pt was measured for custom medi 450 thigh high open toe stocking which was faxed today.  Also sent in order for tribute night wrap.  Pt is aware of cost and that [employer] will call him before ordering                         PT Long Term Goals - 01/02/21 1630       PT LONG TERM GOAL #1   Title Pt will be educated on lymphedema diagnosis and risk reduction principles    Time 8    Period Weeks    Status New      PT LONG TERM GOAL #2   Title Pt will obtain appropriate compression garments for the Rt LE    Time 8    Period Weeks    Status New      PT LONG TERM GOAL #3   Title Pt will be ind with self MLD and/or use of compression pump    Time 8    Period Weeks      PT LONG TERM GOAL #4   Title Pt will be educated on genital edema principles and self care    Time 8    Period Weeks    Status New                   Plan - 03/16/21 1128     Clinical Impression  Statement Pt measured today for custom garments for end of PT lymphedema sessions.  Educated on how to let us know if a remake is needed and that we need to make sure it is strong enough to hold the swelling after wearing for 2 weeks.    PT Frequency 3x / week    PT Duration 6 weeks    PT Treatment/Interventions ADLs/Self Care Home Management;Therapeutic exercise;Patient/family education;Manual lymph drainage;Manual techniques;Taping;Compression bandaging    Consulted and Agree with Plan of Care Patient             Patient will benefit from skilled therapeutic intervention in order to improve the following deficits and impairments:     Visit Diagnosis: Lymphedema, not elsewhere classified     Problem List Patient Active Problem List   Diagnosis Date Noted   Dysphagia 03/03/2020   Nausea and vomiting 03/03/2020   Hypokalemia 03/03/2020   Diabetes ([place]) 10/13/2019   Port-A-Cath in place 02/24/2018   Goals of care, counseling/discussion 01/28/2018   Bladder cancer ([place]) 06/01/2014   Hematuria 05/17/2014   Lesion of bladder 05/17/2014    Stark Bray 03/16/2021, 11:30 AM  [place], [place], [address] Phone: [phone]   Fax:  [phone]  Name: Danny Wood MRN: [id number] Date of Birth: 12/24/1950

## 2021-03-19 ENCOUNTER — Encounter: Payer: Medicare Other | Admitting: Rehabilitation

## 2021-03-20 ENCOUNTER — Other Ambulatory Visit: Payer: Self-pay

## 2021-03-20 ENCOUNTER — Inpatient Hospital Stay (HOSPITAL_BASED_OUTPATIENT_CLINIC_OR_DEPARTMENT_OTHER): Payer: Medicare Other | Admitting: Oncology

## 2021-03-20 ENCOUNTER — Inpatient Hospital Stay: Payer: Medicare Other

## 2021-03-20 ENCOUNTER — Inpatient Hospital Stay: Payer: Medicare Other | Attending: Oncology

## 2021-03-20 ENCOUNTER — Other Ambulatory Visit: Payer: Self-pay | Admitting: *Deleted

## 2021-03-20 VITALS — BP 116/66 | HR 64 | Temp 97.5°F | Resp 18 | Ht 74.0 in | Wt 182.1 lb

## 2021-03-20 DIAGNOSIS — C679 Malignant neoplasm of bladder, unspecified: Secondary | ICD-10-CM

## 2021-03-20 DIAGNOSIS — C775 Secondary and unspecified malignant neoplasm of intrapelvic lymph nodes: Secondary | ICD-10-CM | POA: Diagnosis not present

## 2021-03-20 DIAGNOSIS — C680 Malignant neoplasm of urethra: Secondary | ICD-10-CM | POA: Diagnosis present

## 2021-03-20 DIAGNOSIS — R600 Localized edema: Secondary | ICD-10-CM | POA: Diagnosis not present

## 2021-03-20 DIAGNOSIS — I890 Lymphedema, not elsewhere classified: Secondary | ICD-10-CM | POA: Insufficient documentation

## 2021-03-20 DIAGNOSIS — Z5112 Encounter for antineoplastic immunotherapy: Secondary | ICD-10-CM | POA: Insufficient documentation

## 2021-03-20 DIAGNOSIS — R112 Nausea with vomiting, unspecified: Secondary | ICD-10-CM | POA: Insufficient documentation

## 2021-03-20 DIAGNOSIS — Z79899 Other long term (current) drug therapy: Secondary | ICD-10-CM | POA: Diagnosis not present

## 2021-03-20 DIAGNOSIS — Z95828 Presence of other vascular implants and grafts: Secondary | ICD-10-CM

## 2021-03-20 LAB — CBC WITH DIFFERENTIAL (CANCER CENTER ONLY)
Abs Immature Granulocytes: 0.01 10*3/uL (ref 0.00–0.07)
Basophils Absolute: 0 10*3/uL (ref 0.0–0.1)
Basophils Relative: 1 %
Eosinophils Absolute: 0.1 10*3/uL (ref 0.0–0.5)
Eosinophils Relative: 2 %
HCT: 39.2 % (ref 39.0–52.0)
Hemoglobin: 13.9 g/dL (ref 13.0–17.0)
Immature Granulocytes: 0 %
Lymphocytes Relative: 33 %
Lymphs Abs: 2 10*3/uL (ref 0.7–4.0)
MCH: 30.5 pg (ref 26.0–34.0)
MCHC: 35.5 g/dL (ref 30.0–36.0)
MCV: 86 fL (ref 80.0–100.0)
Monocytes Absolute: 0.5 10*3/uL (ref 0.1–1.0)
Monocytes Relative: 8 %
Neutro Abs: 3.5 10*3/uL (ref 1.7–7.7)
Neutrophils Relative %: 56 %
Platelet Count: 192 10*3/uL (ref 150–400)
RBC: 4.56 MIL/uL (ref 4.22–5.81)
RDW: 12.9 % (ref 11.5–15.5)
WBC Count: 6.1 10*3/uL (ref 4.0–10.5)
nRBC: 0 % (ref 0.0–0.2)

## 2021-03-20 LAB — CMP (CANCER CENTER ONLY)
ALT: 23 U/L (ref 0–44)
AST: 28 U/L (ref 15–41)
Albumin: 3.5 g/dL (ref 3.5–5.0)
Alkaline Phosphatase: 92 U/L (ref 38–126)
Anion gap: 8 (ref 5–15)
BUN: 23 mg/dL (ref 8–23)
CO2: 28 mmol/L (ref 22–32)
Calcium: 8.9 mg/dL (ref 8.9–10.3)
Chloride: 102 mmol/L (ref 98–111)
Creatinine: 1.1 mg/dL (ref 0.61–1.24)
GFR, Estimated: >60 mL/min (ref >60)
Glucose, Bld: 177 mg/dL — ABNORMAL HIGH (ref 70–99)
Potassium: 4.4 mmol/L (ref 3.5–5.1)
Sodium: 138 mmol/L (ref 135–145)
Total Bilirubin: 0.4 mg/dL (ref 0.3–1.2)
Total Protein: 6.7 g/dL (ref 6.5–8.1)

## 2021-03-20 MED ORDER — SODIUM CHLORIDE 0.9% FLUSH
10.0000 mL | INTRAVENOUS | Status: DC | PRN
  Administered 2021-03-20: 10 mL
  Filled 2021-03-20: qty 10

## 2021-03-20 MED ORDER — SODIUM CHLORIDE 0.9 % IV SOLN
10.0000 mg | Freq: Once | INTRAVENOUS | Status: AC
  Administered 2021-03-20: 10 mg via INTRAVENOUS
  Filled 2021-03-20: qty 10

## 2021-03-20 MED ORDER — GABAPENTIN 300 MG PO CAPS: 300.0000 mg | ORAL_CAPSULE | Freq: Two times a day (BID) | ORAL | 3 refills | Status: DC

## 2021-03-20 MED ORDER — HEPARIN SOD (PORK) LOCK FLUSH 100 UNIT/ML IV SOLN
500.0000 [IU] | Freq: Once | INTRAVENOUS | Status: AC | PRN
  Administered 2021-03-20: 500 [IU]
  Filled 2021-03-20: qty 5

## 2021-03-20 MED ORDER — PROCHLORPERAZINE MALEATE 10 MG PO TABS
ORAL_TABLET | ORAL | Status: AC
  Filled 2021-03-20: qty 1

## 2021-03-20 MED ORDER — SODIUM CHLORIDE 0.9% FLUSH
10.0000 mL | Freq: Once | INTRAVENOUS | Status: AC
  Administered 2021-03-20: 10 mL
  Filled 2021-03-20: qty 10

## 2021-03-20 MED ORDER — SODIUM CHLORIDE 0.9 % IV SOLN
0.8000 mg/kg | Freq: Once | INTRAVENOUS | Status: AC
  Administered 2021-03-20: 60 mg via INTRAVENOUS
  Filled 2021-03-20: qty 6

## 2021-03-20 MED ORDER — SODIUM CHLORIDE 0.9 % IV SOLN
Freq: Once | INTRAVENOUS | Status: AC
  Filled 2021-03-20: qty 250

## 2021-03-20 MED ORDER — PROCHLORPERAZINE MALEATE 10 MG PO TABS
10.0000 mg | ORAL_TABLET | Freq: Once | ORAL | Status: AC
  Administered 2021-03-20: 10 mg via ORAL

## 2021-03-20 MED ORDER — OXYCODONE-ACETAMINOPHEN 10-325 MG PO TABS: 1.0000 | ORAL_TABLET | ORAL | 0 refills | Status: DC | PRN

## 2021-03-20 NOTE — Patient Instructions (Signed)
[facility] MEDICAL ONCOLOGY  Discharge Instructions: Thank you for choosing [facility] to provide your oncology and hematology care.   If you have a lab appointment with the Cancer Center, please go directly to the Cancer Center and check in at the registration area.   Wear comfortable clothing and clothing appropriate for easy access to any Portacath or PICC line.   We strive to give you quality time with your provider. You may need to reschedule your appointment if you arrive late (15 or more minutes).  Arriving late affects you and other patients whose appointments are after yours.  Also, if you miss three or more appointments without notifying the office, you may be dismissed from the clinic at the provider's discretion.      For prescription refill requests, have your pharmacy contact our office and allow 72 hours for refills to be completed.    Today you received the following chemotherapy and/or immunotherapy agents: Padcev   To help prevent nausea and vomiting after your treatment, we encourage you to take your nausea medication as directed.  BELOW ARE SYMPTOMS THAT SHOULD BE REPORTED IMMEDIATELY: *FEVER GREATER THAN 100.4 F (38 C) OR HIGHER *CHILLS OR SWEATING *NAUSEA AND VOMITING THAT IS NOT CONTROLLED WITH YOUR NAUSEA MEDICATION *UNUSUAL SHORTNESS OF BREATH *UNUSUAL BRUISING OR BLEEDING *URINARY PROBLEMS (pain or burning when urinating, or frequent urination) *BOWEL PROBLEMS (unusual diarrhea, constipation, pain near the anus) TENDERNESS IN MOUTH AND THROAT WITH OR WITHOUT PRESENCE OF ULCERS (sore throat, sores in mouth, or a toothache) UNUSUAL RASH, SWELLING OR PAIN  UNUSUAL VAGINAL DISCHARGE OR ITCHING   Items with * indicate a potential emergency and should be followed up as soon as possible or go to the Emergency Department if any problems should occur.  Please show the CHEMOTHERAPY ALERT CARD or IMMUNOTHERAPY ALERT CARD at check-in to the  Emergency Department and triage nurse.  Should you have questions after your visit or need to cancel or reschedule your appointment, please contact [facility] MEDICAL [facility]  Dept: [phone]  and follow the prompts.  Office hours are 8:00 a.m. to 4:30 p.m. Monday - Friday. Please note that voicemails left after 4:00 p.m. may not be returned until the following business day.  We are closed weekends and major holidays. You have access to a nurse at all times for urgent questions. Please call the main number to the clinic Dept: [phone] and follow the prompts.   For any non-urgent questions, you may also contact your provider using MyChart. We now offer e-Visits for anyone 18 and older to request care online for non-urgent symptoms. For details visit [email or web address].   Also download the MyChart app! Go to the app store, search "MyChart", open the app, select [facility], and log in with your MyChart username and password.  Due to Covid, a mask is required upon entering the hospital/clinic. If you do not have a mask, one will be given to you upon arrival. For doctor visits, patients may have 1 support person aged 18 or older with them. For treatment visits, patients cannot have anyone with them due to current Covid guidelines and our immunocompromised population.   

## 2021-03-20 NOTE — Progress Notes (Signed)
Hematology and Oncology Follow Up Visit  Danny Wood [id number] 1951/01/18 70 y.o. 03/20/2021 11:37 AM Danny Wood Danny Wood, Danny Brow, MD   Principle Diagnosis: 70 year old man with bladder cancer diagnosed in 2015.  He developed stage IV high-grade urothelial carcinoma with abdominal adenopathy in 2019.  Prior Therapy:  He underwent a cystoscopy and a TURBT on 05/17/2014.   Neoadjuvant systemic chemotherapy in the form of gemcitabine and cisplatin cycle 1 day 1 is on 06/17/2014. He is S/P two cycles completed in 07/2014.  Therapy tolerated poorly with symptoms of nausea and vomiting and worsening renal function.  He is status post robotic cystoprostatectomy and bilateral lymphadenectomy completed on September 09, 2014.  The final pathology revealed T2N0 without any lymph node involvement.  He developed pelvic adenopathy that was biopsy-proven on Jan 14, 2018 to be metastatic urothelial carcinoma.  Carboplatin and gemcitabine cycle 1 started on 02/17/2018.  He completed 8 cycles of therapy in December 2019.  Pembrolizumab 200 mg every 3 weeks started on April 01, 2019.  He is status post 7 cycles of therapy in December 2020.  Therapy stopped because of of patient preference, excellent clinical response and treatment holiday.  He developed progression of disease in June 2021.   Current therapy: Padcev started on February 11, 2020.  He is currently receiving it at  0.75 mg/kg once a months for maintenance purposes.  He is here for the subsequent cycle of therapy.   Interim History: [name] is here for repeat follow-up visit.  Since the last visit, he reports no major changes in his health.  He continues to tolerate chemotherapy without any major complications at this time.  He denies any recent hospitalizations or illnesses.  Denies any nausea vomiting or abdominal pain.  Continues to have right thigh edema related to lymphedema continues to participate in physical and occupational  therapy.  He does have a neuropathic component to his pain in his left lower extremity.  Overall his performance status quality of life remains unchanged.              Medications: Unchanged on review. Current Outpatient Medications  Medication Sig Dispense Refill   diphenoxylate-atropine (LOMOTIL) 2.5-0.025 MG tablet 1 to 2 PO QID prn diarrhea 45 tablet 2   furosemide (LASIX) 20 MG tablet Take 2 tablets (40 mg total) by mouth daily. 90 tablet 1   gabapentin (NEURONTIN) 300 MG capsule Take 1 capsule (300 mg total) by mouth at bedtime. 90 capsule 3   Insulin Glargine (BASAGLAR KWIKPEN) 100 UNIT/ML Inject 8 Units into the skin every morning. 9 mL 2   insulin lispro (HUMALOG KWIKPEN) 100 UNIT/ML KwikPen Inject 2 Units into the skin daily with supper. 1.8 mL 3   oxyCODONE-acetaminophen (PERCOCET) 10-325 MG tablet Take 1 tablet by mouth every 4 (four) hours as needed for pain. 60 tablet 0   prochlorperazine (COMPAZINE) 10 MG tablet TAKE 1 TABLET(10 MG) BY MOUTH EVERY 6 HOURS AS NEEDED FOR NAUSEA OR VOMITING 30 tablet 0   sertraline (ZOLOFT) 25 MG tablet TAKE 1 TABLET(25 MG) BY MOUTH DAILY 30 tablet 1   No current facility-administered medications for this visit.     Allergies: No Known Allergies    Physical Exam:   Blood pressure 116/66, pulse 64, temperature (!) 97.5 F (36.4 C), temperature source Oral, resp. rate 18, height 6\' 2"  (1.88 m), weight 182 lb 1.6 oz (82.6 kg), SpO2 100 %.    ECOG: 1    General appearance: Alert, awake without any  distress. Head: Atraumatic without abnormalities Oropharynx: Without any thrush or ulcers. Eyes: No scleral icterus. Lymph nodes: No lymphadenopathy noted in the cervical, supraclavicular, or axillary nodes Heart:regular rate and rhythm, without any murmurs or gallops.   Lung: Clear to auscultation without any rhonchi, wheezes or dullness to percussion. Abdomin: Soft, nontender without any shifting dullness or  ascites. Musculoskeletal: Right thigh edema noted. Neurological: No motor or sensory deficits. Skin: No rashes or lesions.                .   Lab Results: Lab Results  Component Value Date   WBC 5.7 02/20/2021   HGB 13.5 02/20/2021   HCT 37.7 (L) 02/20/2021   MCV 85.3 02/20/2021   PLT 180 02/20/2021     Chemistry           Impression and Plan:  70 year old man with:    1.  Bladder cancer diagnosed in 2015.  He developed stage IV high-grade urothelial carcinoma of the bladder with adenopathy noted in 2019.  He continues to tolerate Padcev without any major complications.  Risks and benefits of continuing this treatment were reviewed at this time.  Complications including worsening neuropathy, hyperglycemia among others were reiterated.  He has tolerated the current dose and schedule much better than higher frequency.  The plan is to update his staging scans and October 2022.  Will be done sooner if needed.  He is agreeable to continue.  2.  IV access: Port-A-Cath remains in use without any issues.  3.  Pain: He is currently on Neurontin and Percocet with pain is manageable.  I have asked him to increase his Neurontin to 300 mg twice daily and continues Percocet as needed.  4.  Prognosis: His disease is incurable although aggressive measures are warranted given his reasonable performance status.   5.  Lymphedema of the right thigh: No significant improvement at this time he continues with occupational therapy.  6. Followup: He will return for repeat evaluation in 4 weeks and treatment.  30  minutes were dedicated to this encounter.  The time was spent on reviewing laboratory data, disease status update, treatment options and complications of therapy.   [name], MD 7/19/202211:37 AM

## 2021-03-21 ENCOUNTER — Ambulatory Visit: Payer: Medicare Other | Admitting: Rehabilitation

## 2021-03-21 ENCOUNTER — Encounter: Payer: Self-pay | Admitting: Rehabilitation

## 2021-03-21 DIAGNOSIS — I890 Lymphedema, not elsewhere classified: Secondary | ICD-10-CM

## 2021-03-21 NOTE — Therapy (Addendum)
[place], [place], [address] Phone: [phone]   Fax:  [phone]  Physical Therapy Treatment  Patient Details  Name: Danny Wood MRN: [id number] Date of Birth: October 04, 1950 Referring Provider (PT): Dr. Alen Blew   Encounter Date: 03/21/2021   PT End of Session - 03/21/21 1055     Visit Number 13    Number of Visits 22    Date for PT Re-Evaluation 04/23/21    PT Start Time 1000    PT Stop Time 1053    PT Time Calculation (min) 53 min    Activity Tolerance Patient tolerated treatment well    Behavior During Therapy [facility] for tasks assessed/performed             Past Medical History:  Diagnosis Date   At risk for sleep apnea    STOP-BANG= 4       SENT TO PCP 05-16-2014   bladder ca dx'd 05/17/14   met dz 12/2017   Bladder disorder    Hematuria    History of renal cell carcinoma    2003  S/P  PARTIAL RIGHT NEPHRECTOMY   Presence of surgical incision    lumbar diskectomy 05-11-2014-  bandage present   Urgency of urination     Past Surgical History:  Procedure Laterality Date   CYSTOSCOPY N/A 09/09/2014   Procedure: CYSTOSCOPY WITH INDOCYANINE GREEN DYE INJECTION AND URETHRAL DILATION;  Surgeon: Alexis Frock, MD;  Location: [facility];  Service: Urology;  Laterality: N/A;   CYSTOSCOPY WITH BIOPSY N/A 05/17/2014   Procedure: CYSTO WITH BLADDER BIOPSY;  Surgeon: Malka So, MD;  Location: [facility];  Service: Urology;  Laterality: N/A;   EVALUATION UNDER ANESTHESIA WITH HEMORRHOIDECTOMY  07/07/2012   Procedure: EXAM UNDER ANESTHESIA WITH HEMORRHOIDECTOMY;  Surgeon: Joyice Faster. Cornett, MD;  Location: [place];  Service: General;  Laterality: N/A;   IR IMAGING GUIDED PORT INSERTION  02/20/2018   LUMBAR MICRODISCECTOMY  05-11-2014   L4 -- L5 (partial)   PARTIAL NEPHRECTOMY Right 2003   ROBOT ASSISTED LAPAROSCOPIC COMPLETE CYSTECT ILEAL CONDUIT N/A 09/09/2014   Procedure: ROBOTIC  ASSISTED LAPAROSCOPIC COMPLETE CYSTOPROSTATECTOMY,  ILEAL CONDUIT;  Surgeon: Alexis Frock, MD;  Location: [facility];  Service: Urology;  Laterality: N/A;    There were no vitals filed for this visit.   Subjective Assessment - 03/21/21 1001     Subjective Haven't heard from [facility]. I saw Dr. Alen Blew who gave me more gabapentin for the nerve pain    Pertinent History Stage IV high grade bladder cancer with metastases to pelvic LN.  cystoprostatectomy with ileal conduit by Dr. Tresa Moore in 2016 with chemotherapy completed x 2 courses, disease progression 2021.  Currently on Padcev. Kidney values normal, no heart problems.    Currently in Pain? Yes    Pain Score 4     Pain Location Leg    Pain Orientation Left    Pain Descriptors / Indicators Throbbing    Pain Type Neuropathic pain    Pain Onset More than a month ago    Pain Frequency Constant                               [facility] Adult PT Treatment/Exercise - 03/21/21 0001       Manual Therapy   Manual therapy comments sent demo and prescription to [employer] to start the process as it is okayed by both MDs.  Manual Lymphatic Drainage (MLD) performance of MLD;   short neck, 5 diaphragmatic breaths, right axillary and right inguinal LN's,  right anterior inguino-axillary pathway, Right lateral thigh, then medial, and lateral thigh reworking pathways and then right lower leg ant and posterior , lat thigh, and reworking pathways. Then posterior leg in left sidelying    Compression Bandaging Med TG soft, small artiflex foot to ankle, 15 cm comprilan roll  ankle to thigh, 8 cm to foot with figure 8's, 10 cm to ankle with heel locks working up leg, 10cm mid calf to mid thigh , 1 12 cm knee to thigh, 1-12 cm below knee to thigh. Isoband in herring bone to thigh to help hold wrap up               10cm proximal to patella 47.8  At mid patella 39.5  30cm proximal to floor 35.7  20cm proximal to floor 25.4  10cm proximal to  floor 22.8  5cm from 1st MTP 23.8  Great toe 8.1             PT Long Term Goals - 01/02/21 1630       PT LONG TERM GOAL #1   Title Pt will be educated on lymphedema diagnosis and risk reduction principles    Time 8    Period Weeks    Status New      PT LONG TERM GOAL #2   Title Pt will obtain appropriate compression garments for the Rt LE    Time 8    Period Weeks    Status New      PT LONG TERM GOAL #3   Title Pt will be ind with self MLD and/or use of compression pump    Time 8    Period Weeks      PT LONG TERM GOAL #4   Title Pt will be educated on genital edema principles and self care    Time 8    Period Weeks    Status New                    Plan - 03/21/21 1055     Clinical Impression Statement Continued MLD and bandaging waiting for garment arrival.  Prescription was finally signed for compression pump so this was sent over today to [employer]. Overall the LE is much improved in terms of skin mobility and visibility of landmarks.  but pt continues to refill and experience increased LE edema, fibrosis, and skin hardening increasing pain and decreasing mobility   PT Frequency 3x / week    PT Duration 6 weeks    PT Treatment/Interventions ADLs/Self Care Home Management;Therapeutic exercise;Patient/family education;Manual lymph drainage;Manual techniques;Taping;Compression bandaging    PT Next Visit Plan continue MLD and bandaging until set up with garments and [employer] - pt may have interest in balance exercise or learning OTAGO type balance work    Oncologist with Plan of Care Patient            Pt continues with chronic lymphedema after at least 4 weeks of compression, elevation, and exercises provided with PT treatment and self management.   Pt demonstrates hyperplasia with increased size and is at risk for hyperkeratosis and fibrosis with untreated lymphedema.    Pt would benefit from an advanced pump due to scrotal  edema   Visit Diagnosis: Lymphedema, not elsewhere classified     Problem List Patient Active Problem List   Diagnosis Date Noted  Dysphagia 03/03/2020   Nausea and vomiting 03/03/2020   Hypokalemia 03/03/2020   Diabetes ([place]) 10/13/2019   Port-A-Cath in place 02/24/2018   Goals of care, counseling/discussion 01/28/2018   Bladder cancer ([place]) 06/01/2014   Hematuria 05/17/2014   Lesion of bladder 05/17/2014    Stark Bray 03/21/2021, 10:58 AM  [place], [place], [address] Phone: [phone]   Fax:  [phone]  Name: Danny Wood MRN: [id number] Date of Birth: Sep 10, 1950

## 2021-03-26 ENCOUNTER — Ambulatory Visit: Payer: Medicare Other

## 2021-03-26 ENCOUNTER — Other Ambulatory Visit: Payer: Self-pay

## 2021-03-26 DIAGNOSIS — I890 Lymphedema, not elsewhere classified: Secondary | ICD-10-CM

## 2021-03-26 NOTE — Therapy (Signed)
[place], [place], [address] Phone: [phone]   Fax:  [phone]  Physical Therapy Treatment  Patient Details  Name: Danny Wood MRN: [id number] Date of Birth: Aug 08, 1951 Referring Provider (PT): Dr. Alen Blew   Encounter Date: 03/26/2021   PT End of Session - 03/26/21 1354     Visit Number 14    Number of Visits 22    Authorization Type [employer]    PT Start Time 8366    PT Stop Time 1351    PT Time Calculation (min) 55 min    Activity Tolerance Patient tolerated treatment well    Behavior During Therapy [facility] for tasks assessed/performed             Past Medical History:  Diagnosis Date   At risk for sleep apnea    STOP-BANG= 4       SENT TO PCP 05-16-2014   bladder ca dx'd 05/17/14   met dz 12/2017   Bladder disorder    Hematuria    History of renal cell carcinoma    2003  S/P  PARTIAL RIGHT NEPHRECTOMY   Presence of surgical incision    lumbar diskectomy 05-11-2014-  bandage present   Urgency of urination     Past Surgical History:  Procedure Laterality Date   CYSTOSCOPY N/A 09/09/2014   Procedure: CYSTOSCOPY WITH INDOCYANINE GREEN DYE INJECTION AND URETHRAL DILATION;  Surgeon: Alexis Frock, MD;  [facility];  Service: Urology;  Laterality: N/A;   CYSTOSCOPY WITH BIOPSY N/A 05/17/2014   Procedure: CYSTO WITH BLADDER BIOPSY;  Surgeon: Malka So, MD;  Location: [facility];  Service: Urology;  Laterality: N/A;   EVALUATION UNDER ANESTHESIA WITH HEMORRHOIDECTOMY  07/07/2012   Procedure: EXAM UNDER ANESTHESIA WITH HEMORRHOIDECTOMY;  Surgeon: Joyice Faster. Cornett, MD;  Location: [place];  Service: General;  Laterality: N/A;   IR IMAGING GUIDED PORT INSERTION  02/20/2018   LUMBAR MICRODISCECTOMY  05-11-2014   L4 -- L5 (partial)   PARTIAL NEPHRECTOMY Right 2003   ROBOT ASSISTED LAPAROSCOPIC COMPLETE CYSTECT ILEAL CONDUIT N/A 09/09/2014   Procedure: ROBOTIC ASSISTED  LAPAROSCOPIC COMPLETE CYSTOPROSTATECTOMY,  ILEAL CONDUIT;  Surgeon: Alexis Frock, MD;  Location: [facility];  Service: Urology;  Laterality: N/A;    There were no vitals filed for this visit.   Subjective Assessment - 03/26/21 1254     Subjective The numbness in my left leg has nearly gone away now.  Right leg pain is not any better.  Still havent heard from [facility] or [facility].    Pertinent History Stage IV high grade bladder cancer with metastases to pelvic LN.  cystoprostatectomy with ileal conduit by Dr. Tresa Moore in 2016 with chemotherapy completed x 2 courses, disease progression 2021.  Currently on Padcev. Kidney values normal, no heart problems.    Limitations Walking    How long can you walk comfortably? I get pain without my pain medication and if the swelling gets worse    Diagnostic tests Recent CT showing general edema of the Rt leg, abdomen, and scrotum    Patient Stated Goals what to do for the swelling    Currently in Pain? Yes    Pain Score 5    with 2 percocet   Pain Location Leg                               [facility] Adult PT Treatment/Exercise -  03/26/21 0001       Manual Therapy   Manual Lymphatic Drainage (MLD) performance of MLD;   short neck, 5 diaphragmatic breaths, right axillary and right inguinal LN's,  right anterior inguino-axillary pathway, Right lateral thigh, then medial, and lateral thigh reworking pathways and then right lower leg ant and posterior , lat thigh, and reworking pathways.    Compression Bandaging Med TG soft, small artiflex foot to ankle, 15 cm comprilan roll  ankle to thigh, 8 cm to foot with figure 8's and ankle with heel locks working up leg, 10cm mid calf to knee, 1 12 cm knee to thigh, 1-12 cm below knee to thigh. Isoband in herring bone to thigh to help hold wrap up                    PT Education - 03/26/21 1316     Education Details Pt was educated in standing gastroc stretch or siting strap stretch and great  toe stretch for suspected plantar fascitis    Person(s) Educated Patient    Methods Explanation;Handout;Demonstration    Comprehension Verbalized understanding;Returned demonstration                 PT Long Term Goals - 01/02/21 1630       PT LONG TERM GOAL #1   Title Pt will be educated on lymphedema diagnosis and risk reduction principles    Time 8    Period Weeks    Status New      PT LONG TERM GOAL #2   Title Pt will obtain appropriate compression garments for the Rt LE    Time 8    Period Weeks    Status New      PT LONG TERM GOAL #3   Title Pt will be ind with self MLD and/or use of compression pump    Time 8    Period Weeks      PT LONG TERM GOAL #4   Title Pt will be educated on genital edema principles and self care    Time 8    Period Weeks    Status New                   Plan - 03/26/21 1355     Clinical Impression Statement Continued MLD and right LE compression bandaging.  Pt has not yet heard from [facility] or [employer] [facility].  I emailed Kathlee Nations at [facility] to ask her to contact pt regarding his garments. Lower leg looked excellent today but thigh is still much more swollen and firmer.  He has symptoms of plantar fascitis in the right foot with arch and calf tenderness and tightness. He was shown gastroc and great toe stretching to perform 3 reps 2X's per day to try and improve symptoms    Personal Factors and Comorbidities Age;Comorbidity 2    Comorbidities radiation history, lymph node removal,    Examination-Activity Limitations Locomotion Level    Examination-Participation Restrictions Yard Work    Stability/Clinical Decision Making Evolving/Moderate complexity    Rehab Potential Good    PT Frequency 3x / week    PT Duration 6 weeks    PT Treatment/Interventions ADLs/Self Care Home Management;Therapeutic exercise;Patient/family education;Manual lymph drainage;Manual techniques;Taping;Compression bandaging    PT Next Visit Plan continue  MLD and bandaging until set up with garments and [facility] - pt may have interest in balance exercise or learning OTAGO type balance work.  see if he has done gastroc/great toe  stretches    Recommended Other Services awaiting [facility] and garments    Consulted and Agree with Plan of Care Patient             Patient will benefit from skilled therapeutic intervention in order to improve the following deficits and impairments:  Decreased skin integrity, Decreased knowledge of precautions, Increased edema, Difficulty walking, Pain  Visit Diagnosis: Lymphedema, not elsewhere classified     Problem List Patient Active Problem List   Diagnosis Date Noted   Dysphagia 03/03/2020   Nausea and vomiting 03/03/2020   Hypokalemia 03/03/2020   Diabetes ([place]) 10/13/2019   Port-A-Cath in place 02/24/2018   Goals of care, counseling/discussion 01/28/2018   Bladder cancer ([place]) 06/01/2014   Hematuria 05/17/2014   Lesion of bladder 05/17/2014    Elsie Ra Lawanna Cecere 03/26/2021, 2:00 PM  [place], [place], [address] Phone: [phone]   Fax:  [phone]  Name: Danny Wood MRN: [id number] Date of Birth: [birth year] Cheral Almas, PT 03/26/21 2:02 PM

## 2021-03-26 NOTE — Patient Instructions (Signed)
Access Code: [email or web address] URL: [email or web address] Date: 03/26/2021 Prepared by: Cheral Almas  Exercises Gastroc Stretch on Wall - 2 x daily - 7 x weekly - 1 sets - 3 reps Seated Calf Stretch with Strap - 1 x daily - 7 x weekly - 1 sets - 3 reps Seated Plantar Fascia Stretch - 1 x daily - 7 x weekly - 1 sets - 3 reps

## 2021-03-28 ENCOUNTER — Ambulatory Visit: Payer: Medicare Other

## 2021-03-28 ENCOUNTER — Other Ambulatory Visit: Payer: Self-pay

## 2021-03-28 DIAGNOSIS — I890 Lymphedema, not elsewhere classified: Secondary | ICD-10-CM | POA: Diagnosis not present

## 2021-03-28 NOTE — Therapy (Signed)
[place], [place], [address] Phone: [phone]   Fax:  [phone]  Physical Therapy Treatment  Patient Details  Name: Danny Wood MRN: [id number] Date of Birth: 18-Dec-1950 Referring Provider (PT): Dr. Alen Blew   Encounter Date: 03/28/2021   PT End of Session - 03/28/21 1352     Visit Number 15    Number of Visits 22    Date for PT Re-Evaluation 04/23/21    Authorization Type [employer]    PT Start Time 1302    PT Stop Time 1347    PT Time Calculation (min) 45 min    Activity Tolerance Patient tolerated treatment well    Behavior During Therapy [facility] for tasks assessed/performed             Past Medical History:  Diagnosis Date   At risk for sleep apnea    STOP-BANG= 4       SENT TO PCP 05-16-2014   bladder ca dx'd 05/17/14   met dz 12/2017   Bladder disorder    Hematuria    History of renal cell carcinoma    2003  S/P  PARTIAL RIGHT NEPHRECTOMY   Presence of surgical incision    lumbar diskectomy 05-11-2014-  bandage present   Urgency of urination     Past Surgical History:  Procedure Laterality Date   CYSTOSCOPY N/A 09/09/2014   Procedure: CYSTOSCOPY WITH INDOCYANINE GREEN DYE INJECTION AND URETHRAL DILATION;  Surgeon: Alexis Frock, MD;  Location: [facility];  Service: Urology;  Laterality: N/A;   CYSTOSCOPY WITH BIOPSY N/A 05/17/2014   Procedure: CYSTO WITH BLADDER BIOPSY;  Surgeon: Malka So, MD;  Location: [facility];  Service: Urology;  Laterality: N/A;   EVALUATION UNDER ANESTHESIA WITH HEMORRHOIDECTOMY  07/07/2012   Procedure: EXAM UNDER ANESTHESIA WITH HEMORRHOIDECTOMY;  Surgeon: Joyice Faster. Cornett, MD;  Location: [facility];  Service: General;  Laterality: N/A;   IR IMAGING GUIDED PORT INSERTION  02/20/2018   LUMBAR MICRODISCECTOMY  05-11-2014   L4 -- L5 (partial)   PARTIAL NEPHRECTOMY Right 2003   ROBOT ASSISTED LAPAROSCOPIC COMPLETE CYSTECT ILEAL CONDUIT N/A  09/09/2014   Procedure: ROBOTIC ASSISTED LAPAROSCOPIC COMPLETE CYSTOPROSTATECTOMY,  ILEAL CONDUIT;  Surgeon: Alexis Frock, MD;  Location: [facility];  Service: Urology;  Laterality: N/A;    There were no vitals filed for this visit.   Subjective Assessment - 03/28/21 1259     Subjective Took wraps off this am because I had to take a shower without a bag on it. Have tried the calf/arch stretches   Pertinent History Stage IV high grade bladder cancer with metastases to pelvic LN.  cystoprostatectomy with ileal conduit by Dr. Tresa Moore in 2016 with chemotherapy completed x 2 courses, disease progression 2021.  Currently on Padcev. Kidney values normal, no heart problems.    How long can you walk comfortably? I get pain without my pain medication and if the swelling gets worse    Diagnostic tests Recent CT showing general edema of the Rt leg, abdomen, and scrotum    Patient Stated Goals what to do for the swelling    Currently in Pain? Yes    Pain Score 5     Pain Location Foot    Pain Orientation Left    Pain Descriptors / Indicators Throbbing    Pain Type Neuropathic pain    Pain Onset More than a month ago    Pain Frequency Constant    Multiple Pain Sites  No                               [facility] Adult PT Treatment/Exercise - 03/28/21 0001       Manual Therapy   Manual therapy comments left calf and great toe stretch by PT 3 x 30 sec    Manual Lymphatic Drainage (MLD) performance of MLD;   short neck, 5 diaphragmatic breaths, right axillary and right inguinal LN's,  right anterior inguino-axillary pathway, Right lateral thigh, then medial, and lateral thigh reworking pathways and then right lower leg ant and posterior , lat thigh, and reworking pathways.    Compression Bandaging Med TG soft, small artiflex foot to ankle, 15 cm comprilan roll  ankle to thigh, 8 cm to foot with figure 8's and ankle with heel locks working up leg, 10cm mid calf to knee, 1 12 cm knee to thigh, 1-12  cm below knee to thigh. Isoband in herring bone to thigh to help hold wrap up                         PT Long Term Goals - 01/02/21 1630       PT LONG TERM GOAL #1   Title Pt will be educated on lymphedema diagnosis and risk reduction principles    Time 8    Period Weeks    Status New      PT LONG TERM GOAL #2   Title Pt will obtain appropriate compression garments for the Rt LE    Time 8    Period Weeks    Status New      PT LONG TERM GOAL #3   Title Pt will be ind with self MLD and/or use of compression pump    Time 8    Period Weeks      PT LONG TERM GOAL #4   Title Pt will be educated on genital edema principles and self care    Time 8    Period Weeks    Status New                   Plan - 03/28/21 1353     Clinical Impression Statement Continued MLD, and right LE bandaging, and performed right gastroc and great toe stretches to address arch pain.  Slightly less wrap at foot today for comfort since his foot is not swelling. There was no swelling in lower leg/foot today even though he took wraps off this am. He reports numbness/burning in Right leg seems to be improving and left is only in the great toe now.  Lower leg is without visible edema, but thigh is still more swollen and firm.  [facility] has tried contacting him 4 times.  He will try calling [facility] when he leaves here.  Advised we want to get him in his stockings as quickly as possible    Personal Factors and Comorbidities Age;Comorbidity 2    Comorbidities radiation history, lymph node removal,    Examination-Activity Limitations Locomotion Level    Examination-Participation Restrictions Yard Work    Stability/Clinical Decision Making Evolving/Moderate complexity    Rehab Potential Good    PT Frequency 3x / week    PT Duration 6 weeks    PT Treatment/Interventions ADLs/Self Care Home Management;Therapeutic exercise;Patient/family education;Manual lymph drainage;Manual  techniques;Taping;Compression bandaging    PT Next Visit Plan continue MLD and bandaging until set up with garments  and flexitouch - pt may have interest in balance exercise or learning OTAGO type balance work.  see if he has done gastroc/great toe stretches    Consulted and Agree with Plan of Care Patient             Patient will benefit from skilled therapeutic intervention in order to improve the following deficits and impairments:  Decreased skin integrity, Decreased knowledge of precautions, Increased edema, Difficulty walking, Pain  Visit Diagnosis: Lymphedema, not elsewhere classified     Problem List Patient Active Problem List   Diagnosis Date Noted   Dysphagia 03/03/2020   Nausea and vomiting 03/03/2020   Hypokalemia 03/03/2020   Diabetes ([place]) 10/13/2019   Port-A-Cath in place 02/24/2018   Goals of care, counseling/discussion 01/28/2018   Bladder cancer ([place]) 06/01/2014   Hematuria 05/17/2014   Lesion of bladder 05/17/2014    Elsie Ra Xochitl Egle 03/28/2021, 1:58 PM  [place], [place], [address] Phone: [phone]   Fax:  [phone]  Name: Danny Wood MRN: [id number] Date of Birth: Jun 05, 1951 Cheral Almas, PT 03/28/21 1:59 PM

## 2021-04-04 ENCOUNTER — Other Ambulatory Visit: Payer: Self-pay | Admitting: Internal Medicine

## 2021-04-04 ENCOUNTER — Telehealth: Payer: Self-pay | Admitting: Medical Oncology

## 2021-04-04 DIAGNOSIS — C679 Malignant neoplasm of bladder, unspecified: Secondary | ICD-10-CM

## 2021-04-04 MED ORDER — OXYCODONE-ACETAMINOPHEN 10-325 MG PO TABS: 1.0000 | ORAL_TABLET | ORAL | 0 refills | Status: DC | PRN

## 2021-04-04 NOTE — Telephone Encounter (Signed)
Refill Percocet. Pt of Dr Hazeline Junker requests refill for Percocet 10-325.He is coming every 2 weeks for Enfortumumab and monthly to see Shadad.  Dr Alen Blew is scheduled to return next  Tuesday

## 2021-04-17 ENCOUNTER — Inpatient Hospital Stay (HOSPITAL_BASED_OUTPATIENT_CLINIC_OR_DEPARTMENT_OTHER): Payer: Medicare Other | Admitting: Oncology

## 2021-04-17 ENCOUNTER — Inpatient Hospital Stay: Payer: Medicare Other | Attending: Oncology

## 2021-04-17 ENCOUNTER — Other Ambulatory Visit: Payer: Self-pay

## 2021-04-17 ENCOUNTER — Inpatient Hospital Stay: Payer: Medicare Other

## 2021-04-17 VITALS — BP 124/74 | HR 65 | Temp 98.4°F | Resp 19 | Ht 74.0 in | Wt 183.1 lb

## 2021-04-17 DIAGNOSIS — Z79899 Other long term (current) drug therapy: Secondary | ICD-10-CM | POA: Insufficient documentation

## 2021-04-17 DIAGNOSIS — C679 Malignant neoplasm of bladder, unspecified: Secondary | ICD-10-CM

## 2021-04-17 DIAGNOSIS — I890 Lymphedema, not elsewhere classified: Secondary | ICD-10-CM | POA: Insufficient documentation

## 2021-04-17 DIAGNOSIS — Z95828 Presence of other vascular implants and grafts: Secondary | ICD-10-CM

## 2021-04-17 DIAGNOSIS — Z5112 Encounter for antineoplastic immunotherapy: Secondary | ICD-10-CM | POA: Diagnosis present

## 2021-04-17 DIAGNOSIS — C680 Malignant neoplasm of urethra: Secondary | ICD-10-CM | POA: Diagnosis present

## 2021-04-17 DIAGNOSIS — F3289 Other specified depressive episodes: Secondary | ICD-10-CM

## 2021-04-17 DIAGNOSIS — G629 Polyneuropathy, unspecified: Secondary | ICD-10-CM | POA: Diagnosis not present

## 2021-04-17 DIAGNOSIS — C775 Secondary and unspecified malignant neoplasm of intrapelvic lymph nodes: Secondary | ICD-10-CM | POA: Insufficient documentation

## 2021-04-17 LAB — CMP (CANCER CENTER ONLY)
ALT: 31 U/L (ref 0–44)
AST: 27 U/L (ref 15–41)
Albumin: 3.5 g/dL (ref 3.5–5.0)
Alkaline Phosphatase: 79 U/L (ref 38–126)
Anion gap: 8 (ref 5–15)
BUN: 25 mg/dL — ABNORMAL HIGH (ref 8–23)
CO2: 26 mmol/L (ref 22–32)
Calcium: 8.7 mg/dL — ABNORMAL LOW (ref 8.9–10.3)
Chloride: 103 mmol/L (ref 98–111)
Creatinine: 1.17 mg/dL (ref 0.61–1.24)
GFR, Estimated: >60 mL/min (ref >60)
Glucose, Bld: 206 mg/dL — ABNORMAL HIGH (ref 70–99)
Potassium: 4.3 mmol/L (ref 3.5–5.1)
Sodium: 137 mmol/L (ref 135–145)
Total Bilirubin: 0.5 mg/dL (ref 0.3–1.2)
Total Protein: 6.4 g/dL — ABNORMAL LOW (ref 6.5–8.1)

## 2021-04-17 LAB — CBC WITH DIFFERENTIAL (CANCER CENTER ONLY)
Abs Immature Granulocytes: 0.01 10*3/uL (ref 0.00–0.07)
Basophils Absolute: 0 10*3/uL (ref 0.0–0.1)
Basophils Relative: 1 %
Eosinophils Absolute: 0.1 10*3/uL (ref 0.0–0.5)
Eosinophils Relative: 2 %
HCT: 38.9 % — ABNORMAL LOW (ref 39.0–52.0)
Hemoglobin: 13.8 g/dL (ref 13.0–17.0)
Immature Granulocytes: 0 %
Lymphocytes Relative: 32 %
Lymphs Abs: 1.8 10*3/uL (ref 0.7–4.0)
MCH: 30.4 pg (ref 26.0–34.0)
MCHC: 35.5 g/dL (ref 30.0–36.0)
MCV: 85.7 fL (ref 80.0–100.0)
Monocytes Absolute: 0.6 10*3/uL (ref 0.1–1.0)
Monocytes Relative: 10 %
Neutro Abs: 3.1 10*3/uL (ref 1.7–7.7)
Neutrophils Relative %: 55 %
Platelet Count: 187 10*3/uL (ref 150–400)
RBC: 4.54 MIL/uL (ref 4.22–5.81)
RDW: 12.9 % (ref 11.5–15.5)
WBC Count: 5.6 10*3/uL (ref 4.0–10.5)
nRBC: 0 % (ref 0.0–0.2)

## 2021-04-17 MED ORDER — SODIUM CHLORIDE 0.9 % IV SOLN
0.8000 mg/kg | Freq: Once | INTRAVENOUS | Status: AC
  Administered 2021-04-17: 60 mg via INTRAVENOUS
  Filled 2021-04-17: qty 6

## 2021-04-17 MED ORDER — SODIUM CHLORIDE 0.9 % IV SOLN: Freq: Once | INTRAVENOUS | Status: AC

## 2021-04-17 MED ORDER — SODIUM CHLORIDE 0.9% FLUSH
10.0000 mL | INTRAVENOUS | Status: DC | PRN
  Administered 2021-04-17: 10 mL

## 2021-04-17 MED ORDER — HEPARIN SOD (PORK) LOCK FLUSH 100 UNIT/ML IV SOLN
500.0000 [IU] | Freq: Once | INTRAVENOUS | Status: AC | PRN
  Administered 2021-04-17: 500 [IU]

## 2021-04-17 MED ORDER — SERTRALINE HCL 25 MG PO TABS: 25.0000 mg | ORAL_TABLET | Freq: Every day | ORAL | 3 refills | Status: DC

## 2021-04-17 MED ORDER — SODIUM CHLORIDE 0.9% FLUSH
10.0000 mL | Freq: Once | INTRAVENOUS | Status: AC
  Administered 2021-04-17: 10 mL

## 2021-04-17 MED ORDER — PROCHLORPERAZINE MALEATE 10 MG PO TABS
10.0000 mg | ORAL_TABLET | Freq: Once | ORAL | Status: AC
  Administered 2021-04-17: 10 mg via ORAL
  Filled 2021-04-17: qty 1

## 2021-04-17 MED ORDER — OXYCODONE-ACETAMINOPHEN 10-325 MG PO TABS: 1.0000 | ORAL_TABLET | ORAL | 0 refills | Status: DC | PRN

## 2021-04-17 MED ORDER — SODIUM CHLORIDE 0.9 % IV SOLN
10.0000 mg | Freq: Once | INTRAVENOUS | Status: AC
  Administered 2021-04-17: 10 mg via INTRAVENOUS
  Filled 2021-04-17: qty 10

## 2021-04-17 NOTE — Progress Notes (Signed)
Hematology and Oncology Follow Up Visit  Danny Wood [id number] 14-Sep-1950 70 y.o. 04/17/2021 11:48 AM Danny Wood, Danny Brow, MD   Principle Diagnosis: 69 year old man with stage IV high-grade urothelial carcinoma of the bladder presented with abdominal adenopathy in 2019.  Prior Therapy:  He underwent a cystoscopy and a TURBT on 05/17/2014.   Neoadjuvant systemic chemotherapy in the form of gemcitabine and cisplatin cycle 1 day 1 is on 06/17/2014. He is S/P two cycles completed in 07/2014.  Therapy tolerated poorly with symptoms of nausea and vomiting and worsening renal function.  He is status post robotic cystoprostatectomy and bilateral lymphadenectomy completed on September 09, 2014.  The final pathology revealed T2N0 without any lymph node involvement.  He developed pelvic adenopathy that was biopsy-proven on Jan 14, 2018 to be metastatic urothelial carcinoma.  Carboplatin and gemcitabine cycle 1 started on 02/17/2018.  He completed 8 cycles of therapy in December 2019.  Pembrolizumab 200 mg every 3 weeks started on April 01, 2019.  He is status post 7 cycles of therapy in December 2020.  Therapy stopped because of of patient preference, excellent clinical response and treatment holiday.  He developed progression of disease in June 2021.   Current therapy: Padcev started on February 11, 2020.  He is currently receiving it at  0.75 mg/kg once a months for maintenance purposes.  He is here for the next cycle of therapy.   Interim History: Danny Wood returns today for repeat evaluation.  Since her last visit, he reports no major changes in his health.  He continues to complain of neuropathy and neuropathic pain mostly in his right lower extremity.  He had denies any other complications related to treatment.  He denies any nausea, vomiting or abdominal pain.  He denies any excessive fatigue or tiredness.  His performance status quality of life remained  intact.              Medications: Reviewed without changes. Current Outpatient Medications  Medication Sig Dispense Refill   diphenoxylate-atropine (LOMOTIL) 2.5-0.025 MG tablet 1 to 2 PO QID prn diarrhea 45 tablet 2   furosemide (LASIX) 20 MG tablet Take 2 tablets (40 mg total) by mouth daily. 90 tablet 1   gabapentin (NEURONTIN) 300 MG capsule Take 1 capsule (300 mg total) by mouth 2 (two) times daily. 60 capsule 3   Insulin Glargine (BASAGLAR KWIKPEN) 100 UNIT/ML Inject 8 Units into the skin every morning. 9 mL 2   insulin lispro (HUMALOG KWIKPEN) 100 UNIT/ML KwikPen Inject 2 Units into the skin daily with supper. 1.8 mL 3   oxyCODONE-acetaminophen (PERCOCET) 10-325 MG tablet Take 1 tablet by mouth every 4 (four) hours as needed for pain. 30 tablet 0   prochlorperazine (COMPAZINE) 10 MG tablet TAKE 1 TABLET(10 MG) BY MOUTH EVERY 6 HOURS AS NEEDED FOR NAUSEA OR VOMITING 30 tablet 0   sertraline (ZOLOFT) 25 MG tablet TAKE 1 TABLET(25 MG) BY MOUTH DAILY 30 tablet 1   No current facility-administered medications for this visit.   Facility-Administered Medications Ordered in Other Visits  Medication Dose Route Frequency Provider Last Rate Last Admin   sodium chloride flush (NS) 0.9 % injection 10 mL  10 mL Intracatheter Once Danny Portela, MD         Allergies: No Known Allergies    Physical Exam:    Blood pressure 124/74, pulse 65, temperature 98.4 F (36.9 C), temperature source Oral, resp. rate 19, height '6\' 2"'$  (1.88 m), weight 183 lb 1.6 oz (  83.1 kg), SpO2 97 %.    ECOG: 1   General appearance: Comfortable appearing without any discomfort Head: Normocephalic without any trauma Oropharynx: Mucous membranes are moist and pink without any thrush or ulcers. Eyes: Pupils are equal and round reactive to light. Lymph nodes: No cervical, supraclavicular, inguinal or axillary lymphadenopathy.   Heart:regular rate and rhythm.  S1 and S2 without leg edema. Lung: Clear  without any rhonchi or wheezes.  No dullness to percussion. Abdomin: Soft, nontender, nondistended with good bowel sounds.  No hepatosplenomegaly. Musculoskeletal: No joint deformity or effusion.  Full range of motion noted. Neurological: No deficits noted on motor, sensory and deep tendon reflex exam. Skin: No petechial rash or dryness.  Appeared moist.                 .   Lab Results: Lab Results  Component Value Date   WBC 6.1 03/20/2021   HGB 13.9 03/20/2021   HCT 39.2 03/20/2021   MCV 86.0 03/20/2021   PLT 192 03/20/2021     Chemistry           Impression and Plan:  70 year old man with:    1.  Stage IV high-grade urothelial carcinoma of the bladder with adenopathy diagnosed in 2019.  His disease status was updated at this time and treatment choices were discussed.  He continues to tolerate Padcev without any major complaints.  Risks and benefits of continuing this treatment were discussed at this time.  Complications that include nausea, vomiting, neuropathy and hyperglycemia were reiterated.  The plan is to update his staging scans in October of 2022.  He is agreeable to continue at this time.  Therapy discontinuation and treatment break was also proposed and he is objecting to it at this time.  2.  IV access: Port-A-Cath currently in use without any complications.  3.  Pain: Neuropathic in nature related to lymphedema as well as chemotherapy.  I urged him to take Neurontin twice a day instead of once a day.  He will try that in the near future.  4.  Prognosis: Therapy remains palliative although aggressive measures are warranted given his reasonable performance status.   5.  Lymphedema of the right thigh: Remains overall stable without any progression.  6. Followup: In 1 months for repeat evaluation.   30  minutes were spent on this visit.  The time was dedicated to reviewing laboratory data, disease status update, addressing complications noted to  cancer and cancer therapy.   Danny Button, MD 8/16/202211:48 AM

## 2021-04-17 NOTE — Progress Notes (Signed)
Continuing Padcev 60 mg dose. Will follow for weight changes/dosing changes.  Larene Beach, PharmD

## 2021-04-17 NOTE — Patient Instructions (Signed)
[facility]  Discharge Instructions: Thank you for choosing [facility] to provide your oncology and hematology care.   If you have a lab appointment with the Cancer Center, please go directly to the Cancer Center and check in at the registration area.   Wear comfortable clothing and clothing appropriate for easy access to any Portacath or PICC line.   We strive to give you quality time with your provider. You may need to reschedule your appointment if you arrive late (15 or more minutes).  Arriving late affects you and other patients whose appointments are after yours.  Also, if you miss three or more appointments without notifying the office, you may be dismissed from the clinic at the provider's discretion.      For prescription refill requests, have your pharmacy contact our office and allow 72 hours for refills to be completed.    Today you received the following chemotherapy and/or immunotherapy agents: Padcev   To help prevent nausea and vomiting after your treatment, we encourage you to take your nausea medication as directed.  BELOW ARE SYMPTOMS THAT SHOULD BE REPORTED IMMEDIATELY: *FEVER GREATER THAN 100.4 F (38 C) OR HIGHER *CHILLS OR SWEATING *NAUSEA AND VOMITING THAT IS NOT CONTROLLED WITH YOUR NAUSEA MEDICATION *UNUSUAL SHORTNESS OF BREATH *UNUSUAL BRUISING OR BLEEDING *URINARY PROBLEMS (pain or burning when urinating, or frequent urination) *BOWEL PROBLEMS (unusual diarrhea, constipation, pain near the anus) TENDERNESS IN MOUTH AND THROAT WITH OR WITHOUT PRESENCE OF ULCERS (sore throat, sores in mouth, or a toothache) UNUSUAL RASH, SWELLING OR PAIN  UNUSUAL VAGINAL DISCHARGE OR ITCHING   Items with * indicate a potential emergency and should be followed up as soon as possible or go to the Emergency Department if any problems should occur.  Please show the CHEMOTHERAPY ALERT CARD or IMMUNOTHERAPY ALERT CARD at check-in to the  Emergency Department and triage nurse.  Should you have questions after your visit or need to cancel or reschedule your appointment, please contact [facility]  Dept: [phone]  and follow the prompts.  Office hours are 8:00 a.m. to 4:30 p.m. Monday - Friday. Please note that voicemails left after 4:00 p.m. may not be returned until the following business day.  We are closed weekends and major holidays. You have access to a nurse at all times for urgent questions. Please call the main number to the clinic Dept: [phone] and follow the prompts.   For any non-urgent questions, you may also contact your provider using MyChart. We now offer e-Visits for anyone 18 and older to request care online for non-urgent symptoms. For details visit [email or web address].   Also download the MyChart app! Go to the app store, search "MyChart", open the app, select [place] [facility], and log in with your MyChart username and password.  Due to Covid, a mask is required upon entering the hospital/clinic. If you do not have a mask, one will be given to you upon arrival. For doctor visits, patients may have 1 support person aged 18 or older with them. For treatment visits, patients cannot have anyone with them due to current Covid guidelines and our immunocompromised population.   

## 2021-05-08 ENCOUNTER — Telehealth: Payer: Self-pay | Admitting: *Deleted

## 2021-05-08 NOTE — Telephone Encounter (Signed)
Danny Wood states he needs a refill of percocet.   States he is taking gabapentin 1 tablet in am and 1 tablet in pm. " It still really hurts, I feel like I am walking on broken glass. I am getting depressed. I have to lay down every hour to get off my feet". Wants to know if there is anything else that might help.

## 2021-05-08 NOTE — Telephone Encounter (Signed)
LM instructing Danny Wood to increase Gabapentin to 3 times per day

## 2021-05-11 ENCOUNTER — Other Ambulatory Visit: Payer: Self-pay | Admitting: *Deleted

## 2021-05-11 DIAGNOSIS — C679 Malignant neoplasm of bladder, unspecified: Secondary | ICD-10-CM

## 2021-05-11 MED ORDER — OXYCODONE-ACETAMINOPHEN 10-325 MG PO TABS: 1.0000 | ORAL_TABLET | ORAL | 0 refills | Status: DC | PRN

## 2021-05-11 NOTE — Telephone Encounter (Signed)
Patient called - requested refill of Percocet Refill request sent to Dr. Alen Blew

## 2021-05-17 ENCOUNTER — Ambulatory Visit: Payer: Medicare Other | Admitting: Oncology

## 2021-05-17 ENCOUNTER — Ambulatory Visit: Payer: Medicare Other

## 2021-05-17 ENCOUNTER — Other Ambulatory Visit: Payer: Medicare Other

## 2021-05-28 ENCOUNTER — Telehealth: Payer: Self-pay | Admitting: *Deleted

## 2021-05-28 NOTE — Telephone Encounter (Signed)
Patient requesting refill of Percocet to same pharmacy as before.  Routing to MD to review.

## 2021-05-29 ENCOUNTER — Other Ambulatory Visit: Payer: Self-pay | Admitting: Oncology

## 2021-05-29 DIAGNOSIS — C679 Malignant neoplasm of bladder, unspecified: Secondary | ICD-10-CM

## 2021-05-29 MED ORDER — OXYCODONE-ACETAMINOPHEN 10-325 MG PO TABS: 1.0000 | ORAL_TABLET | ORAL | 0 refills | Status: DC | PRN

## 2021-06-11 MED FILL — Dexamethasone Sodium Phosphate Inj 100 MG/10ML: INTRAMUSCULAR | Qty: 1 | Status: AC

## 2021-06-12 ENCOUNTER — Inpatient Hospital Stay (HOSPITAL_BASED_OUTPATIENT_CLINIC_OR_DEPARTMENT_OTHER): Payer: Medicare Other | Admitting: Oncology

## 2021-06-12 ENCOUNTER — Inpatient Hospital Stay: Payer: Medicare Other

## 2021-06-12 ENCOUNTER — Inpatient Hospital Stay: Payer: Medicare Other | Attending: Oncology

## 2021-06-12 ENCOUNTER — Other Ambulatory Visit: Payer: Self-pay

## 2021-06-12 VITALS — BP 119/72 | HR 76 | Temp 98.2°F | Resp 16 | Ht 74.0 in | Wt 182.9 lb

## 2021-06-12 DIAGNOSIS — R600 Localized edema: Secondary | ICD-10-CM | POA: Insufficient documentation

## 2021-06-12 DIAGNOSIS — C679 Malignant neoplasm of bladder, unspecified: Secondary | ICD-10-CM

## 2021-06-12 DIAGNOSIS — I890 Lymphedema, not elsewhere classified: Secondary | ICD-10-CM | POA: Diagnosis not present

## 2021-06-12 DIAGNOSIS — R112 Nausea with vomiting, unspecified: Secondary | ICD-10-CM | POA: Insufficient documentation

## 2021-06-12 DIAGNOSIS — G629 Polyneuropathy, unspecified: Secondary | ICD-10-CM | POA: Diagnosis not present

## 2021-06-12 DIAGNOSIS — C680 Malignant neoplasm of urethra: Secondary | ICD-10-CM | POA: Diagnosis present

## 2021-06-12 DIAGNOSIS — Z95828 Presence of other vascular implants and grafts: Secondary | ICD-10-CM

## 2021-06-12 DIAGNOSIS — Z5112 Encounter for antineoplastic immunotherapy: Secondary | ICD-10-CM | POA: Diagnosis present

## 2021-06-12 DIAGNOSIS — Z79899 Other long term (current) drug therapy: Secondary | ICD-10-CM | POA: Diagnosis not present

## 2021-06-12 LAB — CBC WITH DIFFERENTIAL (CANCER CENTER ONLY)
Abs Immature Granulocytes: 0.01 10*3/uL (ref 0.00–0.07)
Basophils Absolute: 0 10*3/uL (ref 0.0–0.1)
Basophils Relative: 1 %
Eosinophils Absolute: 0.1 10*3/uL (ref 0.0–0.5)
Eosinophils Relative: 1 %
HCT: 39.5 % (ref 39.0–52.0)
Hemoglobin: 13.7 g/dL (ref 13.0–17.0)
Immature Granulocytes: 0 %
Lymphocytes Relative: 32 %
Lymphs Abs: 1.8 10*3/uL (ref 0.7–4.0)
MCH: 29.7 pg (ref 26.0–34.0)
MCHC: 34.7 g/dL (ref 30.0–36.0)
MCV: 85.7 fL (ref 80.0–100.0)
Monocytes Absolute: 0.5 10*3/uL (ref 0.1–1.0)
Monocytes Relative: 9 %
Neutro Abs: 3.3 10*3/uL (ref 1.7–7.7)
Neutrophils Relative %: 57 %
Platelet Count: 218 10*3/uL (ref 150–400)
RBC: 4.61 MIL/uL (ref 4.22–5.81)
RDW: 12.7 % (ref 11.5–15.5)
WBC Count: 5.7 10*3/uL (ref 4.0–10.5)
nRBC: 0 % (ref 0.0–0.2)

## 2021-06-12 LAB — CMP (CANCER CENTER ONLY)
ALT: 30 U/L (ref 0–44)
AST: 27 U/L (ref 15–41)
Albumin: 3.6 g/dL (ref 3.5–5.0)
Alkaline Phosphatase: 73 U/L (ref 38–126)
Anion gap: 9 (ref 5–15)
BUN: 22 mg/dL (ref 8–23)
CO2: 25 mmol/L (ref 22–32)
Calcium: 8.9 mg/dL (ref 8.9–10.3)
Chloride: 104 mmol/L (ref 98–111)
Creatinine: 1.16 mg/dL (ref 0.61–1.24)
GFR, Estimated: >60 mL/min (ref >60)
Glucose, Bld: 191 mg/dL — ABNORMAL HIGH (ref 70–99)
Potassium: 4.3 mmol/L (ref 3.5–5.1)
Sodium: 138 mmol/L (ref 135–145)
Total Bilirubin: 0.4 mg/dL (ref 0.3–1.2)
Total Protein: 6.6 g/dL (ref 6.5–8.1)

## 2021-06-12 MED ORDER — SODIUM CHLORIDE 0.9 % IV SOLN: Freq: Once | INTRAVENOUS | Status: AC

## 2021-06-12 MED ORDER — HEPARIN SOD (PORK) LOCK FLUSH 100 UNIT/ML IV SOLN
500.0000 [IU] | Freq: Once | INTRAVENOUS | Status: AC | PRN
Start: 2021-06-12 — End: 2021-06-12
  Administered 2021-06-12: 500 [IU]

## 2021-06-12 MED ORDER — ENFORTUMAB VEDOTIN-EJFV CHEMO 20 MG IV SOLR
0.8000 mg/kg | Freq: Once | INTRAVENOUS | Status: AC
  Administered 2021-06-12: 60 mg via INTRAVENOUS
  Filled 2021-06-12: qty 6

## 2021-06-12 MED ORDER — SODIUM CHLORIDE 0.9 % IV SOLN
10.0000 mg | Freq: Once | INTRAVENOUS | Status: AC
  Administered 2021-06-12: 10 mg via INTRAVENOUS
  Filled 2021-06-12: qty 10

## 2021-06-12 MED ORDER — OXYCODONE-ACETAMINOPHEN 10-325 MG PO TABS: 1.0000 | ORAL_TABLET | ORAL | 0 refills | Status: DC | PRN

## 2021-06-12 MED ORDER — SODIUM CHLORIDE 0.9% FLUSH
10.0000 mL | INTRAVENOUS | Status: DC | PRN
  Administered 2021-06-12: 10 mL

## 2021-06-12 MED ORDER — PROCHLORPERAZINE MALEATE 10 MG PO TABS
10.0000 mg | ORAL_TABLET | Freq: Once | ORAL | Status: AC
  Administered 2021-06-12: 10 mg via ORAL
  Filled 2021-06-12: qty 1

## 2021-06-12 MED ORDER — SODIUM CHLORIDE 0.9% FLUSH: 10.0000 mL | Freq: Once | INTRAVENOUS | Status: DC

## 2021-06-12 MED ORDER — PREGABALIN 75 MG PO CAPS: 75.0000 mg | ORAL_CAPSULE | Freq: Every day | ORAL | 1 refills | Status: DC

## 2021-06-12 NOTE — Progress Notes (Signed)
Hematology and Oncology Follow Up Visit  Danny Wood [id number] Jul 24, 1951 70 y.o. 06/12/2021 2:45 PM Danny Wood, [name], MD   Principle Diagnosis: 70 year old man with bladder cancer diagnosed in 2015.  He had developed stage IV high-grade urothelial carcinoma with adenopathy noted in 2019.  Prior Therapy:  He underwent a cystoscopy and a TURBT on 05/17/2014.   Neoadjuvant systemic chemotherapy in the form of gemcitabine and cisplatin cycle 1 day 1 is on 06/17/2014. He is S/P two cycles completed in 07/2014.  Therapy tolerated poorly with symptoms of nausea and vomiting and worsening renal function.  He is status post robotic cystoprostatectomy and bilateral lymphadenectomy completed on September 09, 2014.  The final pathology revealed T2N0 without any lymph node involvement.  He developed pelvic adenopathy that was biopsy-proven on Jan 14, 2018 to be metastatic urothelial carcinoma.  Carboplatin and gemcitabine cycle 1 started on 02/17/2018.  He completed 8 cycles of therapy in December 2019.  Pembrolizumab 200 mg every 3 weeks started on April 01, 2019.  He is status post 7 cycles of therapy in December 2020.  Therapy stopped because of of patient preference, excellent clinical response and treatment holiday.  He developed progression of disease in June 2021.   Current therapy: Padcev started on February 11, 2020.  He is currently receiving it at  0.75 mg/kg once a months for maintenance purposes.  He is here for the next cycle of therapy.   Interim History: [name] is here for a follow-up visit.  Since the last visit, he reports no major changes in his health.  He denies any recent hospitalizations or illnesses.  He denies any nausea, vomiting or abdominal pain.  He continues to have right lower extremity pain and edema which is chronic and unchanged.  His pain is predominantly neuropathic pain currently on Neurontin 3 times a  day.              Medications: Updated on review. Current Outpatient Medications  Medication Sig Dispense Refill   diphenoxylate-atropine (LOMOTIL) 2.5-0.025 MG tablet 1 to 2 PO QID prn diarrhea 45 tablet 2   furosemide (LASIX) 20 MG tablet Take 2 tablets (40 mg total) by mouth daily. 90 tablet 1   gabapentin (NEURONTIN) 300 MG capsule Take 1 capsule (300 mg total) by mouth 2 (two) times daily. 60 capsule 3   Insulin Glargine (BASAGLAR KWIKPEN) 100 UNIT/ML Inject 8 Units into the skin every morning. 9 mL 2   insulin lispro (HUMALOG KWIKPEN) 100 UNIT/ML KwikPen Inject 2 Units into the skin daily with supper. 1.8 mL 3   oxyCODONE-acetaminophen (PERCOCET) 10-325 MG tablet Take 1 tablet by mouth every 4 (four) hours as needed for pain. 90 tablet 0   prochlorperazine (COMPAZINE) 10 MG tablet TAKE 1 TABLET(10 MG) BY MOUTH EVERY 6 HOURS AS NEEDED FOR NAUSEA OR VOMITING 30 tablet 0   sertraline (ZOLOFT) 25 MG tablet Take 1 tablet (25 mg total) by mouth at bedtime. 30 tablet 3   No current facility-administered medications for this visit.   Facility-Administered Medications Ordered in Other Visits  Medication Dose Route Frequency Provider Last Rate Last Admin   sodium chloride flush (NS) 0.9 % injection 10 mL  10 mL Intracatheter Once Wyatt Portela, MD         Allergies: No Known Allergies    Physical Exam:    Blood pressure 119/72, pulse 76, temperature 98.2 F (36.8 C), temperature source Tympanic, resp. rate 16, height 6\' 2"  (1.88 m), weight  182 lb 14.4 oz (83 kg), SpO2 98 %.     ECOG: 1   General appearance: Alert, awake without any distress. Head: Atraumatic without abnormalities Oropharynx: Without any thrush or ulcers. Eyes: No scleral icterus. Lymph nodes: No lymphadenopathy noted in the cervical, supraclavicular, or axillary nodes Heart:regular rate and rhythm, without any murmurs or gallops.   Lung: Clear to auscultation without any rhonchi, wheezes or  dullness to percussion. Abdomin: Soft, nontender without any shifting dullness or ascites. Musculoskeletal: No clubbing or cyanosis. Neurological: No motor or sensory deficits. Skin: No rashes or lesions.                .   Lab Results: Lab Results  Component Value Date   WBC 5.6 04/17/2021   HGB 13.8 04/17/2021   HCT 38.9 (L) 04/17/2021   MCV 85.7 04/17/2021   PLT 187 04/17/2021     Chemistry           Impression and Plan:  70 year old man with:    1.  Bladder cancer diagnosed in 2015 and developed stage IV high-grade urothelial carcinoma in 2019.  His disease status post updated at this time and treatment choices were reviewed.  He is currently on Padcev but has not receiving it consistently based on preferences and neuropathy.  Alternative treatment options including traditional chemotherapy, FGFR inhibitors among others were reviewed.  I recommended updating his staging scans in the near future and decide on therapy subsequently.  We will proceed with treatment today and obtain imaging studies before the next month.   2.  IV access: Port-A-Cath remains in use without any issues.  3.  Pain: Multifactorial in nature related to neuropathy and lymphedema.  He is currently on Neurontin and hydrocodone.  I will add Lyrica to his regimen and will assess in the next 4 weeks.  4.  Prognosis: Any treatment is palliative at this time although aggressive measures are warranted given his reasonable performance status.   5.  Lymphedema of the right thigh: I will send a repeat referral to Occupational Therapy to continue his evaluation and treatment.  6. Followup: In 4 weeks for repeat follow-up.   30  minutes were dedicated to this encounter.  The time was spent on reviewing laboratory data, disease status update and future plan of care discussion.   [name], MD 10/11/20222:45 PM

## 2021-06-12 NOTE — Addendum Note (Signed)
Addended by: Wyatt Portela on: 06/12/2021 03:16 PM   Modules accepted: Orders

## 2021-06-19 IMAGING — CT CT CHEST W/ CM
3 of 7 series · 14 of 46 positions shown, 16 images · IV contrast (APPLIED)
Comparison: 03/08/2019

CLINICAL DATA: Invasive bladder cancer. Restaging.

EXAM:
CT CHEST, ABDOMEN, AND PELVIS WITH CONTRAST
TECHNIQUE: Multidetector CT imaging of the chest, abdomen and pelvis was
performed following the standard protocol during bolus
administration of intravenous contrast.
CONTRAST:  100mL OMNIPAQUE IOHEXOL 300 MG/ML  SOLN

[Series 2: axial post · axial · 0.92mm/px · z∈[-610,-10]mm · 9 of 150 slices shown, 11 images]
[im 15/150  soft-tissue]
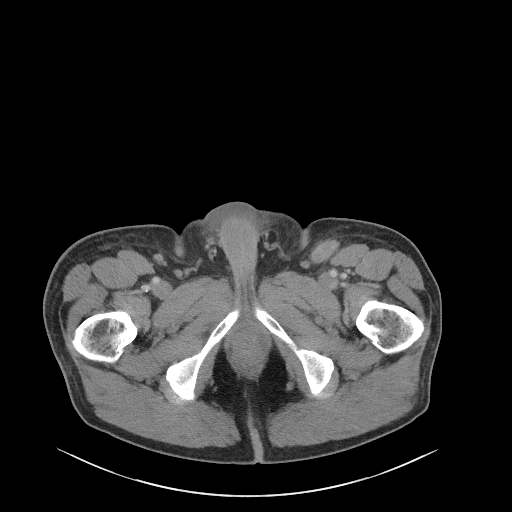
[im 15/150  bone]
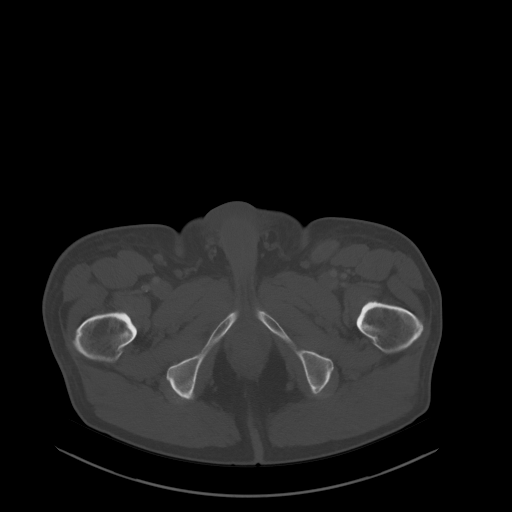
[im 30/150  soft-tissue]
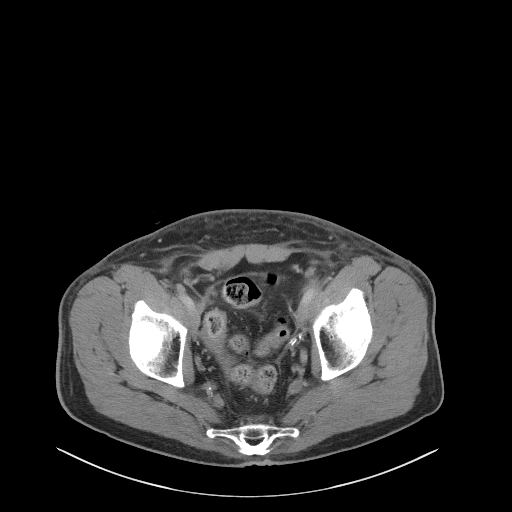
[im 45/150  soft-tissue]
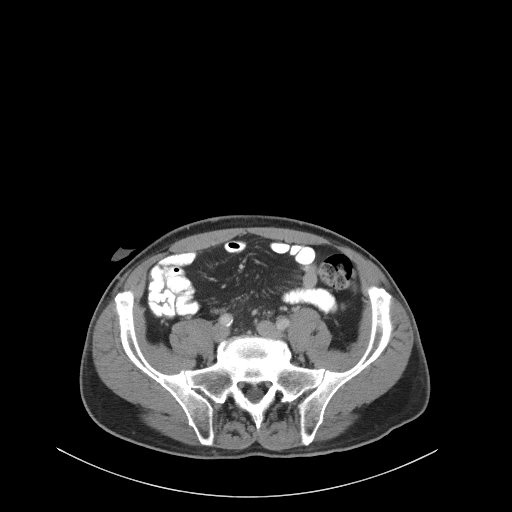
[im 60/150  soft-tissue]
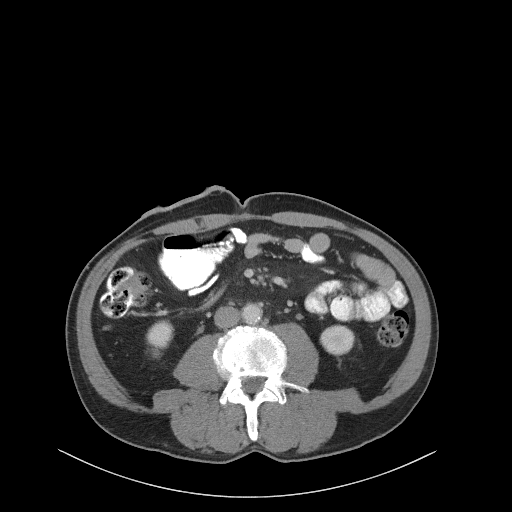
[im 75/150  soft-tissue]
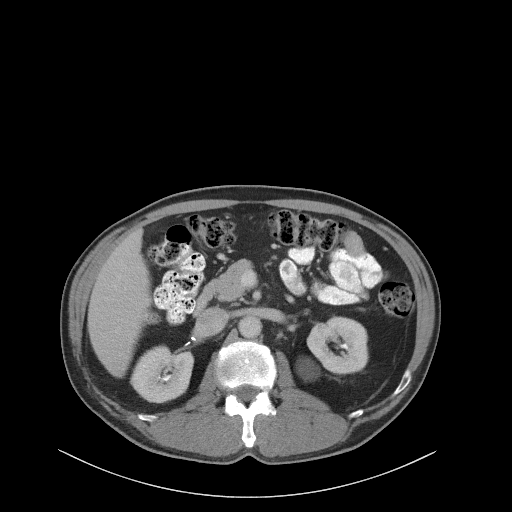
[im 90/150  soft-tissue]
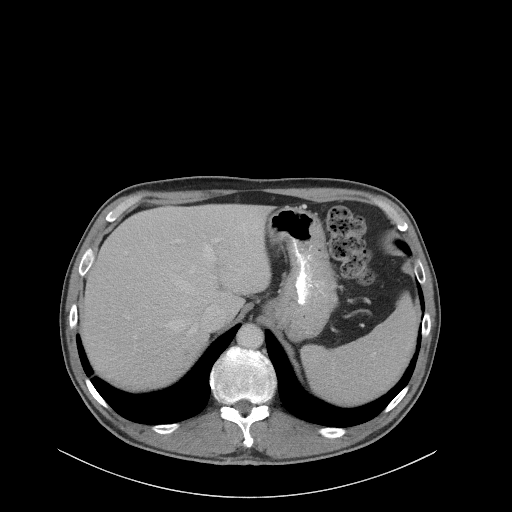
[im 105/150  soft-tissue]
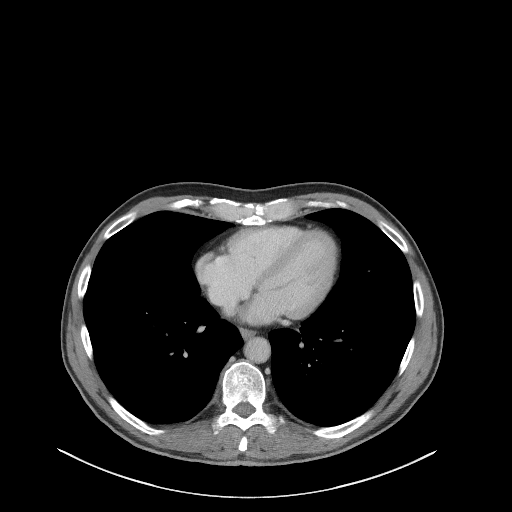
[im 120/150  soft-tissue]
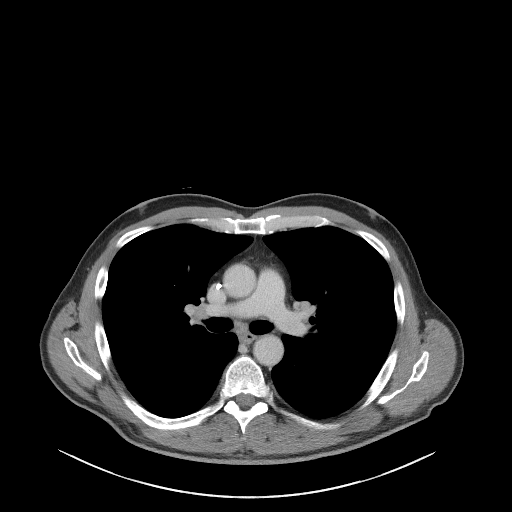
[im 135/150  soft-tissue]
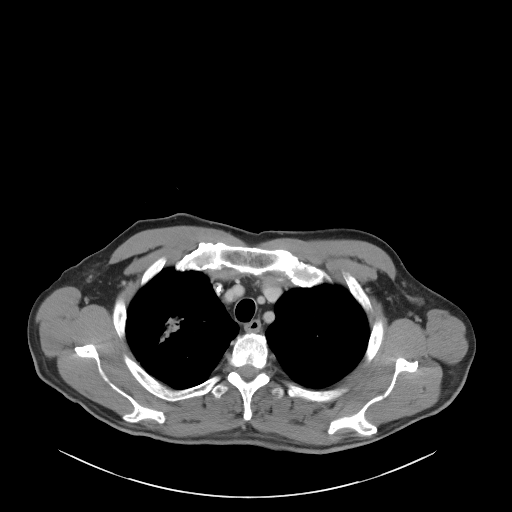
[im 135/150  bone]
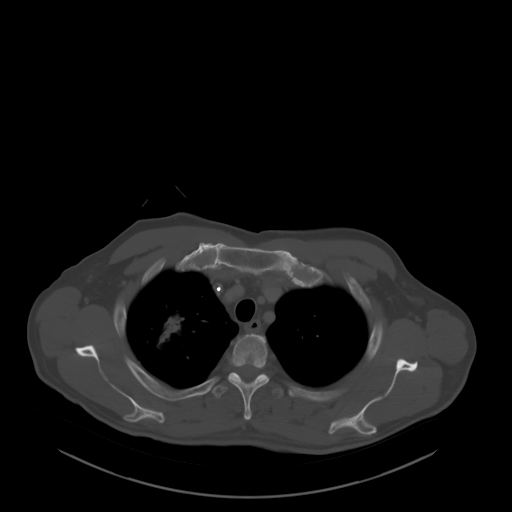

[Series 4: lung post · axial · 0.92mm/px · z∈[-282,-224]mm · 2 of 188 slices shown]
[im 15/188  bone]
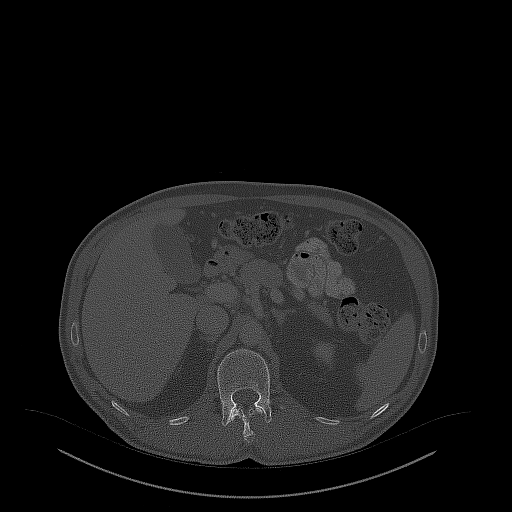
[im 44/188  bone]
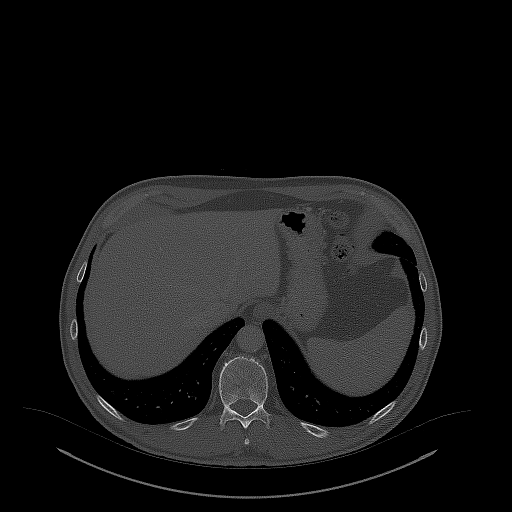

[Series 11: coronal delay · coronal · delayed · 0.83mm/px · 3 of 104 slices shown]
[im 26/104  soft-tissue]
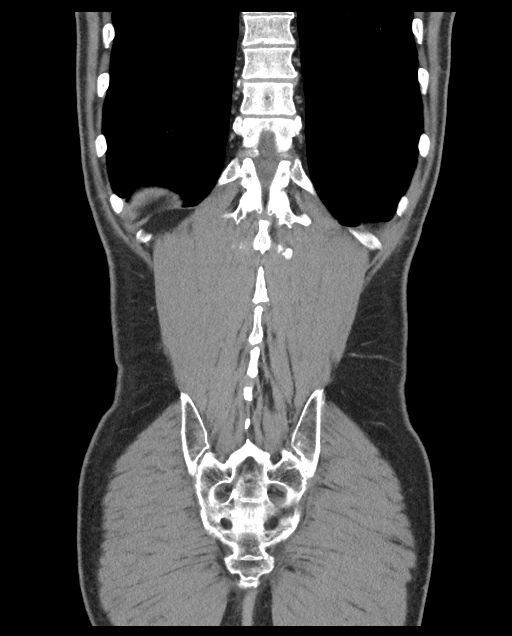
[im 52/104  soft-tissue]
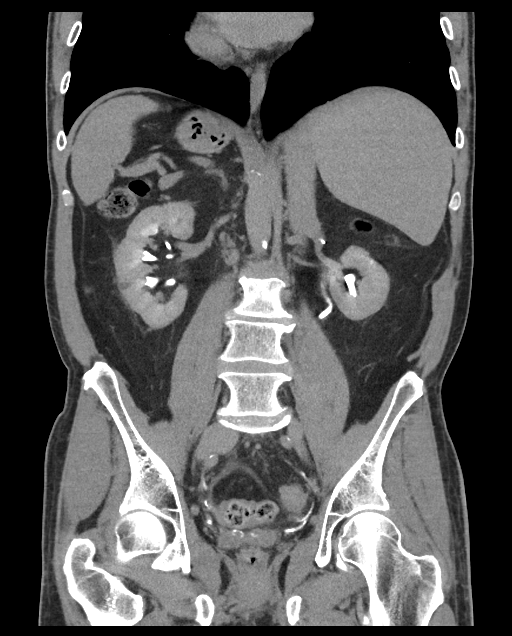
[im 78/104  soft-tissue]
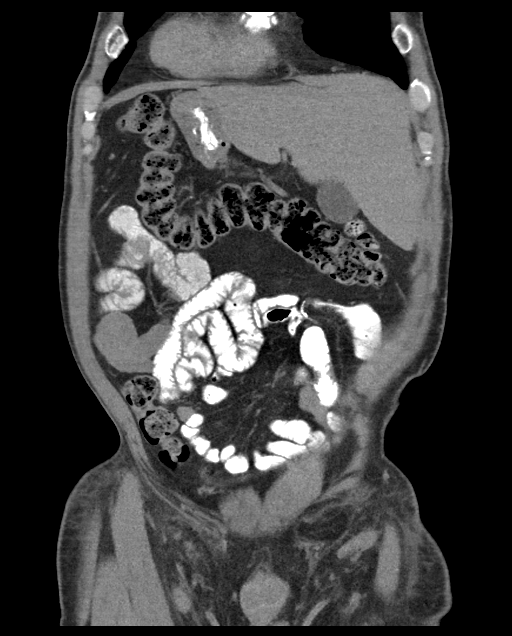

[14 of 46 positions shown; findings below may reference images not displayed]

FINDINGS: CT CHEST FINDINGS

Cardiovascular: 1 The heart size is normal. No substantial
pericardial effusion. Coronary artery calcification is evident.
Atherosclerotic calcification is noted in the wall of the thoracic
aorta. Right Port-A-Cath tip is positioned at the SVC/RA junction.

Mediastinum/Nodes: No mediastinal lymphadenopathy. There is no hilar
lymphadenopathy. The esophagus has normal imaging features. There is
no axillary lymphadenopathy.

Lungs/Pleura: Interval development of a 3.0 x 1.9 cm cavitary lesion
in the right lung apex (34/4). Other scattered ground-glass
opacities are seen in the adjacent lung parenchyma of the right
apex. New patchy ground-glass opacities noted in the left lung apex.
No pleural effusion.

Musculoskeletal: No worrisome lytic or sclerotic osseous
abnormality.

CT ABDOMEN PELVIS FINDINGS

Hepatobiliary: No suspicious focal abnormality within the liver
parenchyma. There is no evidence for gallstones, gallbladder wall
thickening, or pericholecystic fluid. No intrahepatic or
extrahepatic biliary dilation.

Pancreas: No focal mass lesion. No dilatation of the main duct. No
intraparenchymal cyst. No peripancreatic edema.

Spleen: No splenomegaly. No focal mass lesion.

Adrenals/Urinary Tract: No adrenal nodule or mass. Cortical scarring
noted right kidney. Stable 3.9 cm exophytic cyst interpolar left
kidney. No wall thickening or intraluminal soft tissue defect in
either intrarenal collecting system or proximal ureter. No
hydroureter. Status post cysto prostatectomy with right lower
quadrant ileal diversion.

Stomach/Bowel: Stomach is unremarkable. No gastric wall thickening.
No evidence of outlet obstruction. Duodenum is normally positioned
as is the ligament of Treitz. No small bowel wall thickening. No
small bowel dilatation. The terminal ileum is normal. The appendix
is normal. No gross colonic mass. No colonic wall thickening.

Vascular/Lymphatic: There is abdominal aortic atherosclerosis
without aneurysm. There is no gastrohepatic or hepatoduodenal
ligament lymphadenopathy. The retroperitoneal lymphadenopathy seen
previously has resolved.

*Index retroperitoneal node near the left renal vein measured
previously at 11.5 mm short axis is now 6 mm short axis (79/2).
*The left para-aortic nodal conglomeration measured at 1.5 cm short
axis previously is now 0.7 cm.
*A dominant 1.5 cm short axis low left para-aortic node measured
previously is now 0.5 cm (86/2).
*Previously described left external iliac node measured at 1.2 cm
short axis is now 0.5 cm short axis on 122/2.
*2.2 cm short axis left groin node measured on the previous study
has decreased to 1.4 cm short axis today (136/2).
*High left groin node measured previously at 1.9 cm short axis is
now 1.1 cm.
*1.8 cm short axis right groin node measured previously has
decreased to 1.2 cm today (127/2).

Reproductive: Status post cysto prostatectomy.

Other: No substantial intraperitoneal free fluid.

Musculoskeletal: No worrisome lytic or sclerotic osseous
abnormality.
IMPRESSION: 1. Interval development of a 3.0 x 1.9 cm cavitary lesion in the
right lung apex with nodular ground-glass attenuation in both lung
apices. This is likely infectious/inflammatory and drug toxicity is
not excluded. Neoplasm could have this appearance. Close attention
on follow-up recommended.
2. Marked interval decrease in size of retroperitoneal and bilateral
groin lymphadenopathy. The abnormal soft tissue seen in the right
pelvis adjacent to the sigmoid colon on the previous study has
resolved.
3. Status post cysto prostatectomy with right lower quadrant ileal
diversion.
4. Aortic Atherosclerosis ([id number]).

## 2021-07-03 ENCOUNTER — Telehealth: Payer: Self-pay | Admitting: *Deleted

## 2021-07-03 ENCOUNTER — Other Ambulatory Visit: Payer: Self-pay | Admitting: Oncology

## 2021-07-03 DIAGNOSIS — C679 Malignant neoplasm of bladder, unspecified: Secondary | ICD-10-CM

## 2021-07-03 MED ORDER — OXYCODONE-ACETAMINOPHEN 10-325 MG PO TABS: 1.0000 | ORAL_TABLET | ORAL | 0 refills | Status: DC | PRN

## 2021-07-03 NOTE — Telephone Encounter (Signed)
Danny Wood called for a refill of Percocet

## 2021-07-09 ENCOUNTER — Ambulatory Visit (HOSPITAL_COMMUNITY)
Admission: RE | Admit: 2021-07-09 | Discharge: 2021-07-09 | Disposition: A | Discharge status: Home / Self Care | Payer: Medicare Other | Source: Ambulatory Visit | Attending: Oncology | Admitting: Oncology

## 2021-07-09 ENCOUNTER — Other Ambulatory Visit: Payer: Self-pay

## 2021-07-09 DIAGNOSIS — C679 Malignant neoplasm of bladder, unspecified: Secondary | ICD-10-CM | POA: Insufficient documentation

## 2021-07-09 MED ORDER — HEPARIN SOD (PORK) LOCK FLUSH 100 UNIT/ML IV SOLN: 500.0000 [IU] | Freq: Once | INTRAVENOUS | Status: AC

## 2021-07-09 MED ORDER — HEPARIN SOD (PORK) LOCK FLUSH 100 UNIT/ML IV SOLN
INTRAVENOUS | Status: AC
  Administered 2021-07-09: 500 [IU] via INTRAVENOUS
  Filled 2021-07-09: qty 5

## 2021-07-09 MED ORDER — IOHEXOL 350 MG/ML SOLN
80.0000 mL | Freq: Once | INTRAVENOUS | Status: AC | PRN
  Administered 2021-07-09: 80 mL via INTRAVENOUS

## 2021-07-10 MED FILL — Dexamethasone Sodium Phosphate Inj 100 MG/10ML: INTRAMUSCULAR | Qty: 1 | Status: AC

## 2021-07-11 ENCOUNTER — Other Ambulatory Visit: Payer: Self-pay

## 2021-07-11 ENCOUNTER — Inpatient Hospital Stay: Payer: Medicare Other | Attending: Oncology

## 2021-07-11 ENCOUNTER — Inpatient Hospital Stay (HOSPITAL_BASED_OUTPATIENT_CLINIC_OR_DEPARTMENT_OTHER): Payer: Medicare Other | Admitting: Oncology

## 2021-07-11 ENCOUNTER — Inpatient Hospital Stay: Payer: Medicare Other

## 2021-07-11 VITALS — BP 128/65 | HR 71 | Temp 97.5°F | Resp 20 | Wt 185.5 lb

## 2021-07-11 DIAGNOSIS — M79604 Pain in right leg: Secondary | ICD-10-CM | POA: Diagnosis not present

## 2021-07-11 DIAGNOSIS — C679 Malignant neoplasm of bladder, unspecified: Secondary | ICD-10-CM | POA: Diagnosis present

## 2021-07-11 DIAGNOSIS — I700 Atherosclerosis of aorta: Secondary | ICD-10-CM | POA: Diagnosis not present

## 2021-07-11 DIAGNOSIS — J984 Other disorders of lung: Secondary | ICD-10-CM | POA: Diagnosis not present

## 2021-07-11 DIAGNOSIS — R112 Nausea with vomiting, unspecified: Secondary | ICD-10-CM | POA: Diagnosis not present

## 2021-07-11 DIAGNOSIS — Z79899 Other long term (current) drug therapy: Secondary | ICD-10-CM | POA: Diagnosis not present

## 2021-07-11 DIAGNOSIS — R590 Localized enlarged lymph nodes: Secondary | ICD-10-CM | POA: Insufficient documentation

## 2021-07-11 DIAGNOSIS — M7989 Other specified soft tissue disorders: Secondary | ICD-10-CM | POA: Insufficient documentation

## 2021-07-11 DIAGNOSIS — Z95828 Presence of other vascular implants and grafts: Secondary | ICD-10-CM

## 2021-07-11 DIAGNOSIS — R188 Other ascites: Secondary | ICD-10-CM | POA: Insufficient documentation

## 2021-07-11 DIAGNOSIS — R911 Solitary pulmonary nodule: Secondary | ICD-10-CM | POA: Diagnosis not present

## 2021-07-11 DIAGNOSIS — C680 Malignant neoplasm of urethra: Secondary | ICD-10-CM | POA: Insufficient documentation

## 2021-07-11 DIAGNOSIS — Z5112 Encounter for antineoplastic immunotherapy: Secondary | ICD-10-CM | POA: Diagnosis present

## 2021-07-11 LAB — CBC WITH DIFFERENTIAL (CANCER CENTER ONLY)
Abs Immature Granulocytes: 0.02 10*3/uL (ref 0.00–0.07)
Basophils Absolute: 0 10*3/uL (ref 0.0–0.1)
Basophils Relative: 1 %
Eosinophils Absolute: 0.1 10*3/uL (ref 0.0–0.5)
Eosinophils Relative: 1 %
HCT: 38.6 % — ABNORMAL LOW (ref 39.0–52.0)
Hemoglobin: 13.7 g/dL (ref 13.0–17.0)
Immature Granulocytes: 0 %
Lymphocytes Relative: 30 %
Lymphs Abs: 1.8 10*3/uL (ref 0.7–4.0)
MCH: 30.1 pg (ref 26.0–34.0)
MCHC: 35.5 g/dL (ref 30.0–36.0)
MCV: 84.8 fL (ref 80.0–100.0)
Monocytes Absolute: 0.5 10*3/uL (ref 0.1–1.0)
Monocytes Relative: 9 %
Neutro Abs: 3.6 10*3/uL (ref 1.7–7.7)
Neutrophils Relative %: 59 %
Platelet Count: 206 10*3/uL (ref 150–400)
RBC: 4.55 MIL/uL (ref 4.22–5.81)
RDW: 12.7 % (ref 11.5–15.5)
WBC Count: 6 10*3/uL (ref 4.0–10.5)
nRBC: 0 % (ref 0.0–0.2)

## 2021-07-11 LAB — CMP (CANCER CENTER ONLY)
ALT: 30 U/L (ref 0–44)
AST: 28 U/L (ref 15–41)
Albumin: 3.5 g/dL (ref 3.5–5.0)
Alkaline Phosphatase: 88 U/L (ref 38–126)
Anion gap: 8 (ref 5–15)
BUN: 22 mg/dL (ref 8–23)
CO2: 25 mmol/L (ref 22–32)
Calcium: 8.5 mg/dL — ABNORMAL LOW (ref 8.9–10.3)
Chloride: 103 mmol/L (ref 98–111)
Creatinine: 1.13 mg/dL (ref 0.61–1.24)
GFR, Estimated: >60 mL/min (ref >60)
Glucose, Bld: 266 mg/dL — ABNORMAL HIGH (ref 70–99)
Potassium: 4.1 mmol/L (ref 3.5–5.1)
Sodium: 136 mmol/L (ref 135–145)
Total Bilirubin: 0.4 mg/dL (ref 0.3–1.2)
Total Protein: 6.4 g/dL — ABNORMAL LOW (ref 6.5–8.1)

## 2021-07-11 MED ORDER — SODIUM CHLORIDE 0.9 % IV SOLN: Freq: Once | INTRAVENOUS | Status: AC

## 2021-07-11 MED ORDER — HEPARIN SOD (PORK) LOCK FLUSH 100 UNIT/ML IV SOLN
500.0000 [IU] | Freq: Once | INTRAVENOUS | Status: AC | PRN
  Administered 2021-07-11: 500 [IU]

## 2021-07-11 MED ORDER — SODIUM CHLORIDE 0.9% FLUSH
10.0000 mL | INTRAVENOUS | Status: DC | PRN
  Administered 2021-07-11: 10 mL

## 2021-07-11 MED ORDER — SODIUM CHLORIDE 0.9% FLUSH
10.0000 mL | Freq: Once | INTRAVENOUS | Status: AC
Start: 2021-07-11 — End: 2021-07-11
  Administered 2021-07-11: 10 mL

## 2021-07-11 MED ORDER — PROCHLORPERAZINE MALEATE 10 MG PO TABS
10.0000 mg | ORAL_TABLET | Freq: Once | ORAL | Status: AC
  Administered 2021-07-11: 10 mg via ORAL
  Filled 2021-07-11: qty 1

## 2021-07-11 MED ORDER — SODIUM CHLORIDE 0.9 % IV SOLN
10.0000 mg | Freq: Once | INTRAVENOUS | Status: AC
  Administered 2021-07-11: 10 mg via INTRAVENOUS
  Filled 2021-07-11: qty 10

## 2021-07-11 MED ORDER — SODIUM CHLORIDE 0.9 % IV SOLN
0.7500 mg/kg | Freq: Once | INTRAVENOUS | Status: AC
  Administered 2021-07-11: 56 mg via INTRAVENOUS
  Filled 2021-07-11: qty 5.6

## 2021-07-11 NOTE — Progress Notes (Signed)
Per Dr. Alen Blew ok to treat with glucose of 266 today.

## 2021-07-11 NOTE — Patient Instructions (Signed)
[facility]  Discharge Instructions: Thank you for choosing [facility] to provide your oncology and hematology care.   If you have a lab appointment with the Cancer Center, please go directly to the Cancer Center and check in at the registration area.   Wear comfortable clothing and clothing appropriate for easy access to any Portacath or PICC line.   We strive to give you quality time with your provider. You may need to reschedule your appointment if you arrive late (15 or more minutes).  Arriving late affects you and other patients whose appointments are after yours.  Also, if you miss three or more appointments without notifying the office, you may be dismissed from the clinic at the provider's discretion.      For prescription refill requests, have your pharmacy contact our office and allow 72 hours for refills to be completed.    Today you received the following chemotherapy and/or immunotherapy agents: Padcev   To help prevent nausea and vomiting after your treatment, we encourage you to take your nausea medication as directed.  BELOW ARE SYMPTOMS THAT SHOULD BE REPORTED IMMEDIATELY: *FEVER GREATER THAN 100.4 F (38 C) OR HIGHER *CHILLS OR SWEATING *NAUSEA AND VOMITING THAT IS NOT CONTROLLED WITH YOUR NAUSEA MEDICATION *UNUSUAL SHORTNESS OF BREATH *UNUSUAL BRUISING OR BLEEDING *URINARY PROBLEMS (pain or burning when urinating, or frequent urination) *BOWEL PROBLEMS (unusual diarrhea, constipation, pain near the anus) TENDERNESS IN MOUTH AND THROAT WITH OR WITHOUT PRESENCE OF ULCERS (sore throat, sores in mouth, or a toothache) UNUSUAL RASH, SWELLING OR PAIN  UNUSUAL VAGINAL DISCHARGE OR ITCHING   Items with * indicate a potential emergency and should be followed up as soon as possible or go to the Emergency Department if any problems should occur.  Please show the CHEMOTHERAPY ALERT CARD or IMMUNOTHERAPY ALERT CARD at check-in to the  Emergency Department and triage nurse.  Should you have questions after your visit or need to cancel or reschedule your appointment, please contact [facility]  Dept: [phone]  and follow the prompts.  Office hours are 8:00 a.m. to 4:30 p.m. Monday - Friday. Please note that voicemails left after 4:00 p.m. may not be returned until the following business day.  We are closed weekends and major holidays. You have access to a nurse at all times for urgent questions. Please call the main number to the clinic Dept: [phone] and follow the prompts.   For any non-urgent questions, you may also contact your provider using MyChart. We now offer e-Visits for anyone 18 and older to request care online for non-urgent symptoms. For details visit [email or web address].   Also download the MyChart app! Go to the app store, search "MyChart", open the app, select [place], and log in with your MyChart username and password.  Due to Covid, a mask is required upon entering the hospital/clinic. If you do not have a mask, one will be given to you upon arrival. For doctor visits, patients may have 1 support person aged 18 or older with them. For treatment visits, patients cannot have anyone with them due to current Covid guidelines and our immunocompromised population.   

## 2021-07-11 NOTE — Progress Notes (Signed)
Hematology and Oncology Follow Up Visit  Danny Wood [id number] 1951/04/21 70 y.o. 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  Presented with localized disease in 2015.  Prior Therapy:  He underwent a cystoscopy and a TURBT on 05/17/2014.   Neoadjuvant systemic chemotherapy in the form of gemcitabine and cisplatin cycle 1 day 1 is on 06/17/2014. He is S/P two cycles completed in 07/2014.  Therapy tolerated poorly with symptoms of nausea and vomiting and worsening renal function.  He is status post robotic cystoprostatectomy and bilateral lymphadenectomy completed on September 09, 2014.  The final pathology revealed T2N0 without any lymph node involvement.  He developed pelvic adenopathy that was biopsy-proven on Jan 14, 2018 to be metastatic urothelial carcinoma.  Carboplatin and gemcitabine cycle 1 started on 02/17/2018.  He completed 8 cycles of therapy in December 2019.  Pembrolizumab 200 mg every 3 weeks started on April 01, 2019.  He is status post 7 cycles of therapy in December 2020.  Therapy stopped because of of patient preference, excellent clinical response and treatment holiday.  He developed progression of disease in June 2021.   Current therapy: Padcev started on February 11, 2020.  He is currently receiving it at  0.75 mg/kg once a months for maintenance purposes.  He is here for the next cycle of therapy.   Interim History: Mr. Bee returns today for repeat evaluation.  Since last visit, he reports no major changes in his health.  He continues to have issues with the right lower extremity including pain and swelling which has not changed dramatically at this time.  His performance status quality of life remains reasonable.  He denies any chest pain or shortness of breath.  He denies any abdominal  discomfort or weight loss.              Medications: Reviewed without changes. Current Outpatient Medications  Medication Sig Dispense Refill   diphenoxylate-atropine (LOMOTIL) 2.5-0.025 MG tablet 1 to 2 PO QID prn diarrhea 45 tablet 2   furosemide (LASIX) 20 MG tablet Take 2 tablets (40 mg total) by mouth daily. 90 tablet 1   gabapentin (NEURONTIN) 300 MG capsule Take 1 capsule (300 mg total) by mouth 2 (two) times daily. 60 capsule 3   Insulin Glargine (BASAGLAR KWIKPEN) 100 UNIT/ML Inject 8 Units into the skin every morning. 9 mL 2   insulin lispro (HUMALOG KWIKPEN) 100 UNIT/ML KwikPen Inject 2 Units into the skin daily with supper. 1.8 mL 3   oxyCODONE-acetaminophen (PERCOCET) 10-325 MG tablet Take 1 tablet by mouth every 4 (four) hours as needed for pain. 90 tablet 0   pregabalin (LYRICA) 75 MG capsule Take 1 capsule (75 mg total) by mouth daily. 60 capsule 1   prochlorperazine (COMPAZINE) 10 MG tablet TAKE 1 TABLET(10 MG) BY MOUTH EVERY 6 HOURS AS NEEDED FOR NAUSEA OR VOMITING 30 tablet 0   sertraline (ZOLOFT) 25 MG tablet Take 1 tablet (25 mg total) by mouth at bedtime. 30 tablet 3   No current facility-administered medications for this visit.   Facility-Administered Medications Ordered in Other Visits  Medication Dose Route Frequency Provider Last Rate Last Admin   sodium chloride flush (NS) 0.9 % injection 10 mL  10 mL Intracatheter Once Wyatt Portela, MD         Allergies: No Known Allergies    Physical Exam:  Blood pressure 128/65, pulse 71, temperature (!) 97.5 F (36.4 C), resp. rate 20, weight 185 lb 8 oz (84.1 kg), SpO2 96 %.     ECOG: 1    General appearance: Comfortable appearing without any discomfort Head: Normocephalic without any trauma Oropharynx: Mucous membranes are moist and pink without any thrush or ulcers. Eyes: Pupils are equal and round reactive to light. Lymph nodes: No cervical, supraclavicular, inguinal or axillary  lymphadenopathy.   Heart:regular rate and rhythm.  S1 and S2 without leg edema. Lung: Clear without any rhonchi or wheezes.  No dullness to percussion. Abdomin: Soft, nontender, nondistended with good bowel sounds.  No hepatosplenomegaly. Musculoskeletal: No joint deformity or effusion.  Full range of motion noted. Neurological: No deficits noted on motor, sensory and deep tendon reflex exam. Skin: No petechial rash or dryness.  Appeared moist.                 .   Lab Results: Lab Results  Component Value Date   WBC 5.7 06/12/2021   HGB 13.7 06/12/2021   HCT 39.5 06/12/2021   MCV 85.7 06/12/2021   PLT 218 06/12/2021     Chemistry       IMPRESSION: Cystic area in the RIGHT upper lobe is no longer visible now with a 14 x 6 mm RIGHT upper lobe spiculated nodule in its place, this represents a change since April of 2021. Findings raise the question of developing bronchogenic neoplasm in this location. PET imaging may be helpful for further assessment. This would be an atypical location for metastatic process. Other areas of parenchymal nodularity are similar.   Waxing and waning soft tissue at the root of the small bowel mesentery is increased in size compared to more recent studies. This could also be further assessed with PET imaging but is suspicious for worsening of disease.   Stable appearance of soft tissue adjacent to the celiac axis.   Diffuse edema about the anterior abdominal wall and lower pelvis extending into the penis and scrotum. Visualized portions for comparison on previous in imaging not substantially changed in the short interval perhaps slow increase over time. More likely lymphedema given chronicity but would correlate with any signs of pain or infection.   Small volume ascites in the pelvis similar to previous imaging.   Aortic Atherosclerosis (ICD10-I70.0).     Impression and Plan:  70 year old man with:    1.  Stage IV  high-grade urothelial carcinoma with pelvic adenopathy diagnosed in 2019.  He continues to receive palliative therapy utilizing Padcev without any major complications.  CT scan obtained on 07/09/2021 was personally reviewed and showed no clear-cut progression of disease specifically with his pelvic adenopathy.  Risks and benefits of continuing Padcev versus therapy discontinuation or switching to a different agent were reiterated.  He is agreeable to continue at this time.   2.  IV access: Port-A-Cath continues to be in place without any issues.  3.  Pelvic and lower extremity pain: Related to lymphedema and malignancy.  4.  Prognosis: Therapy remains palliative although aggressive measures are warranted given his reasonable performance status.   5.  Lymphedema of the right thigh: He has been evaluated and treated by occupational therapy.  6.  Right upper lobe lung lesion: Differential diagnosis includes infectious versus a primary lung neoplasm.  We will obtain a PET scan in the near future for evaluation.  Management choices include obtaining tissue biopsy versus surgical resection would be considered after obtaining imaging studies.  7. Followup: In 4 weeks for repeat follow-up.   30  minutes were dedicated to this encounter including time spent on reviewing disease status, treatment choices and outlining future plan of care.   [name], MD 11/9/20222:09 PM

## 2021-07-24 ENCOUNTER — Telehealth: Payer: Self-pay | Admitting: *Deleted

## 2021-07-24 NOTE — Telephone Encounter (Signed)
Patient called requesting refill of his Oxycodone-Percocet 10-325 to [facility] on file.

## 2021-07-27 ENCOUNTER — Telehealth: Payer: Self-pay | Admitting: *Deleted

## 2021-07-27 ENCOUNTER — Other Ambulatory Visit: Payer: Self-pay | Admitting: Hematology and Oncology

## 2021-07-27 DIAGNOSIS — C679 Malignant neoplasm of bladder, unspecified: Secondary | ICD-10-CM

## 2021-07-27 MED ORDER — OXYCODONE-ACETAMINOPHEN 10-325 MG PO TABS: 1.0000 | ORAL_TABLET | ORAL | 0 refills | Status: DC | PRN

## 2021-07-27 NOTE — Telephone Encounter (Signed)
Pt called for refill of Oxycodone. Ran out on Wednesday.

## 2021-08-02 ENCOUNTER — Ambulatory Visit (HOSPITAL_COMMUNITY)
Admission: RE | Admit: 2021-08-02 | Discharge: 2021-08-02 | Disposition: A | Discharge status: Home / Self Care | Payer: Medicare Other | Source: Ambulatory Visit | Attending: Oncology | Admitting: Oncology

## 2021-08-02 DIAGNOSIS — I2510 Atherosclerotic heart disease of native coronary artery without angina pectoris: Secondary | ICD-10-CM | POA: Diagnosis not present

## 2021-08-02 DIAGNOSIS — C786 Secondary malignant neoplasm of retroperitoneum and peritoneum: Secondary | ICD-10-CM | POA: Diagnosis not present

## 2021-08-02 DIAGNOSIS — I700 Atherosclerosis of aorta: Secondary | ICD-10-CM | POA: Insufficient documentation

## 2021-08-02 DIAGNOSIS — C679 Malignant neoplasm of bladder, unspecified: Secondary | ICD-10-CM | POA: Insufficient documentation

## 2021-08-02 DIAGNOSIS — R609 Edema, unspecified: Secondary | ICD-10-CM | POA: Diagnosis not present

## 2021-08-02 DIAGNOSIS — C784 Secondary malignant neoplasm of small intestine: Secondary | ICD-10-CM | POA: Diagnosis not present

## 2021-08-02 LAB — GLUCOSE, CAPILLARY: Glucose-Capillary: 127 mg/dL — ABNORMAL HIGH (ref 70–99)

## 2021-08-02 MED ORDER — FLUDEOXYGLUCOSE F - 18 (FDG) INJECTION
9.2000 | Freq: Once | INTRAVENOUS | Status: AC
  Administered 2021-08-02: 9.3 via INTRAVENOUS

## 2021-08-16 ENCOUNTER — Other Ambulatory Visit: Payer: Self-pay

## 2021-08-16 ENCOUNTER — Inpatient Hospital Stay (HOSPITAL_BASED_OUTPATIENT_CLINIC_OR_DEPARTMENT_OTHER): Payer: Medicare Other | Admitting: Oncology

## 2021-08-16 ENCOUNTER — Inpatient Hospital Stay: Payer: Medicare Other | Attending: Oncology

## 2021-08-16 ENCOUNTER — Inpatient Hospital Stay: Payer: Medicare Other

## 2021-08-16 ENCOUNTER — Telehealth: Payer: Self-pay | Admitting: *Deleted

## 2021-08-16 VITALS — BP 128/67 | HR 76 | Temp 98.1°F | Resp 17 | Ht 74.0 in | Wt 185.6 lb

## 2021-08-16 VITALS — BP 133/75 | HR 68

## 2021-08-16 DIAGNOSIS — I700 Atherosclerosis of aorta: Secondary | ICD-10-CM | POA: Diagnosis not present

## 2021-08-16 DIAGNOSIS — Z79899 Other long term (current) drug therapy: Secondary | ICD-10-CM | POA: Diagnosis not present

## 2021-08-16 DIAGNOSIS — Z5112 Encounter for antineoplastic immunotherapy: Secondary | ICD-10-CM | POA: Insufficient documentation

## 2021-08-16 DIAGNOSIS — G8929 Other chronic pain: Secondary | ICD-10-CM | POA: Insufficient documentation

## 2021-08-16 DIAGNOSIS — C680 Malignant neoplasm of urethra: Secondary | ICD-10-CM | POA: Diagnosis present

## 2021-08-16 DIAGNOSIS — Z9079 Acquired absence of other genital organ(s): Secondary | ICD-10-CM | POA: Insufficient documentation

## 2021-08-16 DIAGNOSIS — C679 Malignant neoplasm of bladder, unspecified: Secondary | ICD-10-CM

## 2021-08-16 DIAGNOSIS — R102 Pelvic and perineal pain: Secondary | ICD-10-CM | POA: Diagnosis not present

## 2021-08-16 DIAGNOSIS — I890 Lymphedema, not elsewhere classified: Secondary | ICD-10-CM | POA: Insufficient documentation

## 2021-08-16 DIAGNOSIS — I2510 Atherosclerotic heart disease of native coronary artery without angina pectoris: Secondary | ICD-10-CM | POA: Diagnosis not present

## 2021-08-16 LAB — CBC WITH DIFFERENTIAL (CANCER CENTER ONLY)
Abs Immature Granulocytes: 0.02 10*3/uL (ref 0.00–0.07)
Basophils Absolute: 0 10*3/uL (ref 0.0–0.1)
Basophils Relative: 0 %
Eosinophils Absolute: 0.1 10*3/uL (ref 0.0–0.5)
Eosinophils Relative: 1 %
HCT: 40.7 % (ref 39.0–52.0)
Hemoglobin: 14.3 g/dL (ref 13.0–17.0)
Immature Granulocytes: 0 %
Lymphocytes Relative: 21 %
Lymphs Abs: 1.6 10*3/uL (ref 0.7–4.0)
MCH: 29.5 pg (ref 26.0–34.0)
MCHC: 35.1 g/dL (ref 30.0–36.0)
MCV: 84.1 fL (ref 80.0–100.0)
Monocytes Absolute: 0.6 10*3/uL (ref 0.1–1.0)
Monocytes Relative: 8 %
Neutro Abs: 5.2 10*3/uL (ref 1.7–7.7)
Neutrophils Relative %: 70 %
Platelet Count: 186 10*3/uL (ref 150–400)
RBC: 4.84 MIL/uL (ref 4.22–5.81)
RDW: 12.8 % (ref 11.5–15.5)
WBC Count: 7.6 10*3/uL (ref 4.0–10.5)
nRBC: 0 % (ref 0.0–0.2)

## 2021-08-16 LAB — CMP (CANCER CENTER ONLY)
ALT: 29 U/L (ref 0–44)
AST: 29 U/L (ref 15–41)
Albumin: 3.7 g/dL (ref 3.5–5.0)
Alkaline Phosphatase: 64 U/L (ref 38–126)
Anion gap: 9 (ref 5–15)
BUN: 19 mg/dL (ref 8–23)
CO2: 25 mmol/L (ref 22–32)
Calcium: 8.4 mg/dL — ABNORMAL LOW (ref 8.9–10.3)
Chloride: 103 mmol/L (ref 98–111)
Creatinine: 1.02 mg/dL (ref 0.61–1.24)
GFR, Estimated: >60 mL/min (ref >60)
Glucose, Bld: 201 mg/dL — ABNORMAL HIGH (ref 70–99)
Potassium: 4.4 mmol/L (ref 3.5–5.1)
Sodium: 137 mmol/L (ref 135–145)
Total Bilirubin: 0.7 mg/dL (ref 0.3–1.2)
Total Protein: 6.5 g/dL (ref 6.5–8.1)

## 2021-08-16 MED ORDER — SODIUM CHLORIDE 0.9 % IV SOLN
10.0000 mg | Freq: Once | INTRAVENOUS | Status: AC
  Administered 2021-08-16: 10 mg via INTRAVENOUS
  Filled 2021-08-16: qty 10

## 2021-08-16 MED ORDER — SODIUM CHLORIDE 0.9 % IV SOLN: Freq: Once | INTRAVENOUS | Status: AC

## 2021-08-16 MED ORDER — HEPARIN SOD (PORK) LOCK FLUSH 100 UNIT/ML IV SOLN
500.0000 [IU] | Freq: Once | INTRAVENOUS | Status: AC | PRN
  Administered 2021-08-16: 500 [IU]

## 2021-08-16 MED ORDER — PROCHLORPERAZINE MALEATE 10 MG PO TABS
10.0000 mg | ORAL_TABLET | Freq: Once | ORAL | Status: AC
  Administered 2021-08-16: 10 mg via ORAL
  Filled 2021-08-16: qty 1

## 2021-08-16 MED ORDER — SODIUM CHLORIDE 0.9 % IV SOLN
0.8000 mg/kg | Freq: Once | INTRAVENOUS | Status: AC
  Administered 2021-08-16: 60 mg via INTRAVENOUS
  Filled 2021-08-16: qty 6

## 2021-08-16 NOTE — Progress Notes (Signed)
Hematology and Oncology Follow Up Visit  Danny Wood [id number] 1950-09-18 70 y.o. 08/16/2021 11:38 AM Danny Wood Danny Wood, Danny Brow, MD   Principle Diagnosis: 70 year old man with bladder cancer diagnosed in 2019.  He was found to have stage IV high-grade urothelial carcinoma with pelvic adenopathy after presenting with local disease in 2015.    Prior Therapy:  He underwent a cystoscopy and a TURBT on 05/17/2014.   Neoadjuvant systemic chemotherapy in the form of gemcitabine and cisplatin cycle 1 day 1 is on 06/17/2014. He is S/P two cycles completed in 07/2014.  Therapy tolerated poorly with symptoms of nausea and vomiting and worsening renal function.  He is status post robotic cystoprostatectomy and bilateral lymphadenectomy completed on September 09, 2014.  The final pathology revealed T2N0 without any lymph node involvement.  He developed pelvic adenopathy that was biopsy-proven on Jan 14, 2018 to be metastatic urothelial carcinoma.  Carboplatin and gemcitabine cycle 1 started on 02/17/2018.  He completed 8 cycles of therapy in December 2019.  Pembrolizumab 200 mg every 3 weeks started on April 01, 2019.  He is status post 7 cycles of therapy in December 2020.  Therapy stopped because of of patient preference, excellent clinical response and treatment holiday.  He developed progression of disease in June 2021.   Current therapy: Padcev started on February 11, 2020.  He is currently receiving it at  0.75 mg/kg once a months for maintenance purposes.  He returns for the next cycle of therapy.   Interim History: Danny Wood presents today for a follow-up visit.  Since last visit, he reports feeling well without any major complaints.  He continues to have issues with right leg lymphedema and thigh swelling.  He does report chronic pain related to it which is manageable with the current pain medication.  He has stopped gabapentin at this time due to lack of effectiveness.  He  tolerated chemotherapy without any complaints.  He denies any nausea, vomiting or abdominal pain.              Medications: Updated on review. Current Outpatient Medications  Medication Sig Dispense Refill   diphenoxylate-atropine (LOMOTIL) 2.5-0.025 MG tablet 1 to 2 PO QID prn diarrhea 45 tablet 2   furosemide (LASIX) 20 MG tablet Take 2 tablets (40 mg total) by mouth daily. 90 tablet 1   gabapentin (NEURONTIN) 300 MG capsule Take 1 capsule (300 mg total) by mouth 2 (two) times daily. 60 capsule 3   Insulin Glargine (BASAGLAR KWIKPEN) 100 UNIT/ML Inject 8 Units into the skin every morning. 9 mL 2   insulin lispro (HUMALOG KWIKPEN) 100 UNIT/ML KwikPen Inject 2 Units into the skin daily with supper. 1.8 mL 3   oxyCODONE-acetaminophen (PERCOCET) 10-325 MG tablet Take 1 tablet by mouth every 4 (four) hours as needed for pain. 90 tablet 0   pregabalin (LYRICA) 75 MG capsule Take 1 capsule (75 mg total) by mouth daily. 60 capsule 1   prochlorperazine (COMPAZINE) 10 MG tablet TAKE 1 TABLET(10 MG) BY MOUTH EVERY 6 HOURS AS NEEDED FOR NAUSEA OR VOMITING 30 tablet 0   sertraline (ZOLOFT) 25 MG tablet Take 1 tablet (25 mg total) by mouth at bedtime. 30 tablet 3   No current facility-administered medications for this visit.     Allergies: No Known Allergies    Physical Exam:     Blood pressure 128/67, pulse 76, temperature 98.1 F (36.7 C), temperature source Temporal, resp. rate 17, height 6\' 2"  (1.88 m), weight 185 lb 9.6  oz (84.2 kg), SpO2 100 %.     ECOG: 1   General appearance: Alert, awake without any distress. Head: Atraumatic without abnormalities Oropharynx: Without any thrush or ulcers. Eyes: No scleral icterus. Lymph nodes: No lymphadenopathy noted in the cervical, supraclavicular, or axillary nodes Heart:regular rate and rhythm, without any murmurs or gallops.   Lung: Clear to auscultation without any rhonchi, wheezes or dullness to percussion. Abdomin: Soft,  nontender without any shifting dullness or ascites. Musculoskeletal: No clubbing or cyanosis. Neurological: No motor or sensory deficits. Skin: No rashes or lesions.                .   Lab Results: Lab Results  Component Value Date   WBC 6.0 07/11/2021   HGB 13.7 07/11/2021   HCT 38.6 (L) 07/11/2021   MCV 84.8 07/11/2021   PLT 206 07/11/2021     Chemistry      IMPRESSION: 1. Small bowel mesenteric and less so abdominal retroperitoneal hypermetabolic nodal metastasis. 2. The right apical nodule of interest is not significantly hypermetabolic and may represent evolving pleuroparenchymal scar. Recommend attention on follow-up. 3. Status post cystoprostatectomy and ileal conduit. 4. Similar trace cul-de-sac fluid. 5. Similar nonspecific anterior pelvic wall edema. 6. Incidental findings, including: Coronary artery atherosclerosis. Aortic Atherosclerosis (ICD10-I70.0).      Impression and Plan:  70 year old man with:    1.  Bladder cancer diagnosed in 2015.  He developed stage IV high-grade urothelial carcinoma with pelvic adenopathy in 2019.  Risks and benefits of continuing Padcev were discussed at this time.  Complications including nausea, vomiting and neuropathy were reiterated.  He is agreeable to continue at this time.  PET scan showed overall stable disease at this time.  2.  IV access: Port-A-Cath remains in use without any issues.  3.  Pelvic and lower extremity pain: He is currently on Percocet and his pain is manageable.  This will be refilled for him.  4.  Prognosis: His disease is incurable although aggressive measures are warranted.   5.  Lymphedema of the right thigh: Related to malignancy and lymphadenopathy.  Will refer back to occupational therapy.  6.  Right upper lobe lung lesion: PET scan did not show any evidence of malignancy.  No additional testing is needed.  7. Followup: He will return in 1 month for repeat  evaluation.   30  minutes were spent on this visit.  Time was dedicated to reviewing imaging studies, addressing complications, treatment options discussion as well as future plan of care review   Danny Button, MD 12/15/202211:38 AM

## 2021-08-16 NOTE — Telephone Encounter (Signed)
Patient asked if we can help with obtaining a compression sleeve for his lymphedema in his leg which was ordered by PT at [facility].  Patient states he was measured for this during a PT visit in July but has never received it.  He was instructed by Cheral Almas, PT to contact Romeo Rabon at [facility] to investigate, which he states he has tried several times to reach someone but has been unsuccessful.  This RN called Romeo Rabon at [facility] - no answer, left VM regarding the patient & the fact he has not received the compression sleeve & how this can be accomplished.  Contact # [phone]

## 2021-08-16 NOTE — Progress Notes (Signed)
Patient tolerated PADCEV infusion well today, no concerns voiced. Patient discharged. Stable.

## 2021-08-16 NOTE — Patient Instructions (Signed)
[facility]  Discharge Instructions: Thank you for choosing [facility] to provide your oncology and hematology care.   If you have a lab appointment with the [facility], please go directly to the [facility] and check in at the registration area.   Wear comfortable clothing and clothing appropriate for easy access to any Portacath or PICC line.   We strive to give you quality time with your provider. You may need to reschedule your appointment if you arrive late (15 or more minutes).  Arriving late affects you and other patients whose appointments are after yours.  Also, if you miss three or more appointments without notifying the office, you may be dismissed from the clinic at the providers discretion.      For prescription refill requests, have your pharmacy contact our office and allow 72 hours for refills to be completed.    Today you received the following chemotherapy and/or immunotherapy agents: PADCEV   To help prevent nausea and vomiting after your treatment, we encourage you to take your nausea medication as directed.  BELOW ARE SYMPTOMS THAT SHOULD BE REPORTED IMMEDIATELY: *FEVER GREATER THAN 100.4 F (38 C) OR HIGHER *CHILLS OR SWEATING *NAUSEA AND VOMITING THAT IS NOT CONTROLLED WITH YOUR NAUSEA MEDICATION *UNUSUAL SHORTNESS OF BREATH *UNUSUAL BRUISING OR BLEEDING *URINARY PROBLEMS (pain or burning when urinating, or frequent urination) *BOWEL PROBLEMS (unusual diarrhea, constipation, pain near the anus) TENDERNESS IN MOUTH AND THROAT WITH OR WITHOUT PRESENCE OF ULCERS (sore throat, sores in mouth, or a toothache) UNUSUAL RASH, SWELLING OR PAIN  UNUSUAL VAGINAL DISCHARGE OR ITCHING   Items with * indicate a potential emergency and should be followed up as soon as possible or go to the Emergency Department if any problems should occur.  Please show the CHEMOTHERAPY ALERT CARD or IMMUNOTHERAPY ALERT CARD at check-in to the  Emergency Department and triage nurse.  Should you have questions after your visit or need to cancel or reschedule your appointment, please contact [facility]  Dept: [phone]  and follow the prompts.  Office hours are 8:00 a.m. to 4:30 p.m. Monday - Friday. Please note that voicemails left after 4:00 p.m. may not be returned until the following business day.  We are closed weekends and major holidays. You have access to a nurse at all times for urgent questions. Please call the main number to the clinic Dept: [phone] and follow the prompts.   For any non-urgent questions, you may also contact your provider using MyChart. We now offer e-Visits for anyone 66 and older to request care online for non-urgent symptoms. For details visit [email or web address].   Also download the MyChart app! Go to the app store, search "MyChart", open the app, select [facility], and log in with your MyChart username and password.  Due to Covid, a mask is required upon entering the hospital/clinic. If you do not have a mask, one will be given to you upon arrival. For doctor visits, patients may have 1 support person aged 60 or older with them. For treatment visits, patients cannot have anyone with them due to current Covid guidelines and our immunocompromised population.

## 2021-08-17 ENCOUNTER — Telehealth: Payer: Self-pay | Admitting: *Deleted

## 2021-08-17 ENCOUNTER — Other Ambulatory Visit: Payer: Self-pay | Admitting: Oncology

## 2021-08-17 DIAGNOSIS — C679 Malignant neoplasm of bladder, unspecified: Secondary | ICD-10-CM

## 2021-08-17 MED ORDER — OXYCODONE-ACETAMINOPHEN 10-325 MG PO TABS: 1.0000 | ORAL_TABLET | ORAL | 0 refills | Status: DC | PRN

## 2021-08-17 NOTE — Telephone Encounter (Signed)
Received call from [employer] stating that message was received yest regarding compression products for this pt.  She states that they have tried to reach pt by phone & text but never heard back from him.  She states that pt's insurance would not cover so cost would be for two different items.  One would be $319.78 for day use & two piece for hs would be $722.92.  Tried to reach pt & left vm to return call for this info.

## 2021-08-17 NOTE — Telephone Encounter (Signed)
Pt returned call & was given info from Romeo Rabon & # to call her.  He states he never got vm or text from [place] has tried to reach her several times.

## 2021-08-24 ENCOUNTER — Telehealth: Payer: Self-pay | Admitting: Oncology

## 2021-08-24 NOTE — Telephone Encounter (Signed)
Sch per 12/15 los , left msg

## 2021-09-10 ENCOUNTER — Other Ambulatory Visit: Payer: Self-pay | Admitting: Oncology

## 2021-09-10 ENCOUNTER — Telehealth: Payer: Self-pay | Admitting: *Deleted

## 2021-09-10 DIAGNOSIS — C679 Malignant neoplasm of bladder, unspecified: Secondary | ICD-10-CM

## 2021-09-10 MED ORDER — OXYCODONE-ACETAMINOPHEN 10-325 MG PO TABS: 1.0000 | ORAL_TABLET | ORAL | 0 refills | Status: DC | PRN

## 2021-09-10 NOTE — Telephone Encounter (Signed)
Pt called for a refill of Percocet.

## 2021-09-12 MED FILL — Dexamethasone Sodium Phosphate Inj 100 MG/10ML: INTRAMUSCULAR | Qty: 1 | Status: AC

## 2021-09-13 ENCOUNTER — Inpatient Hospital Stay (HOSPITAL_BASED_OUTPATIENT_CLINIC_OR_DEPARTMENT_OTHER): Payer: Medicare Other | Admitting: Oncology

## 2021-09-13 ENCOUNTER — Other Ambulatory Visit: Payer: Self-pay

## 2021-09-13 ENCOUNTER — Inpatient Hospital Stay: Payer: Medicare Other

## 2021-09-13 ENCOUNTER — Inpatient Hospital Stay: Payer: Medicare Other | Attending: Oncology

## 2021-09-13 VITALS — BP 126/75 | HR 77 | Temp 97.8°F | Resp 17 | Ht 74.0 in | Wt 186.1 lb

## 2021-09-13 DIAGNOSIS — G893 Neoplasm related pain (acute) (chronic): Secondary | ICD-10-CM | POA: Insufficient documentation

## 2021-09-13 DIAGNOSIS — Z5112 Encounter for antineoplastic immunotherapy: Secondary | ICD-10-CM | POA: Insufficient documentation

## 2021-09-13 DIAGNOSIS — C679 Malignant neoplasm of bladder, unspecified: Secondary | ICD-10-CM | POA: Diagnosis present

## 2021-09-13 DIAGNOSIS — M79606 Pain in leg, unspecified: Secondary | ICD-10-CM | POA: Diagnosis not present

## 2021-09-13 DIAGNOSIS — M7989 Other specified soft tissue disorders: Secondary | ICD-10-CM | POA: Insufficient documentation

## 2021-09-13 DIAGNOSIS — R112 Nausea with vomiting, unspecified: Secondary | ICD-10-CM | POA: Insufficient documentation

## 2021-09-13 DIAGNOSIS — I890 Lymphedema, not elsewhere classified: Secondary | ICD-10-CM | POA: Insufficient documentation

## 2021-09-13 DIAGNOSIS — Z79899 Other long term (current) drug therapy: Secondary | ICD-10-CM | POA: Diagnosis not present

## 2021-09-13 DIAGNOSIS — Z95828 Presence of other vascular implants and grafts: Secondary | ICD-10-CM

## 2021-09-13 LAB — CMP (CANCER CENTER ONLY)
ALT: 23 U/L (ref 0–44)
AST: 22 U/L (ref 15–41)
Albumin: 3.8 g/dL (ref 3.5–5.0)
Alkaline Phosphatase: 62 U/L (ref 38–126)
Anion gap: 5 (ref 5–15)
BUN: 18 mg/dL (ref 8–23)
CO2: 28 mmol/L (ref 22–32)
Calcium: 8.7 mg/dL — ABNORMAL LOW (ref 8.9–10.3)
Chloride: 102 mmol/L (ref 98–111)
Creatinine: 1.01 mg/dL (ref 0.61–1.24)
GFR, Estimated: >60 mL/min (ref >60)
Glucose, Bld: 207 mg/dL — ABNORMAL HIGH (ref 70–99)
Potassium: 4.3 mmol/L (ref 3.5–5.1)
Sodium: 135 mmol/L (ref 135–145)
Total Bilirubin: 0.5 mg/dL (ref 0.3–1.2)
Total Protein: 6.5 g/dL (ref 6.5–8.1)

## 2021-09-13 LAB — CBC WITH DIFFERENTIAL (CANCER CENTER ONLY)
Abs Immature Granulocytes: 0.01 10*3/uL (ref 0.00–0.07)
Basophils Absolute: 0 10*3/uL (ref 0.0–0.1)
Basophils Relative: 1 %
Eosinophils Absolute: 0.1 10*3/uL (ref 0.0–0.5)
Eosinophils Relative: 1 %
HCT: 41 % (ref 39.0–52.0)
Hemoglobin: 14.6 g/dL (ref 13.0–17.0)
Immature Granulocytes: 0 %
Lymphocytes Relative: 30 %
Lymphs Abs: 1.8 10*3/uL (ref 0.7–4.0)
MCH: 29.8 pg (ref 26.0–34.0)
MCHC: 35.6 g/dL (ref 30.0–36.0)
MCV: 83.7 fL (ref 80.0–100.0)
Monocytes Absolute: 0.5 10*3/uL (ref 0.1–1.0)
Monocytes Relative: 8 %
Neutro Abs: 3.6 10*3/uL (ref 1.7–7.7)
Neutrophils Relative %: 60 %
Platelet Count: 197 10*3/uL (ref 150–400)
RBC: 4.9 MIL/uL (ref 4.22–5.81)
RDW: 12.8 % (ref 11.5–15.5)
WBC Count: 6 10*3/uL (ref 4.0–10.5)
nRBC: 0 % (ref 0.0–0.2)

## 2021-09-13 MED ORDER — PROCHLORPERAZINE MALEATE 10 MG PO TABS
10.0000 mg | ORAL_TABLET | Freq: Once | ORAL | Status: AC
  Administered 2021-09-13: 10 mg via ORAL
  Filled 2021-09-13: qty 1

## 2021-09-13 MED ORDER — HEPARIN SOD (PORK) LOCK FLUSH 100 UNIT/ML IV SOLN
500.0000 [IU] | Freq: Once | INTRAVENOUS | Status: AC | PRN
  Administered 2021-09-13: 500 [IU]

## 2021-09-13 MED ORDER — SODIUM CHLORIDE 0.9 % IV SOLN
0.8000 mg/kg | Freq: Once | INTRAVENOUS | Status: AC
  Administered 2021-09-13: 60 mg via INTRAVENOUS
  Filled 2021-09-13: qty 6

## 2021-09-13 MED ORDER — SODIUM CHLORIDE 0.9 % IV SOLN
10.0000 mg | Freq: Once | INTRAVENOUS | Status: AC
  Administered 2021-09-13: 10 mg via INTRAVENOUS
  Filled 2021-09-13: qty 10

## 2021-09-13 MED ORDER — SODIUM CHLORIDE 0.9 % IV SOLN: Freq: Once | INTRAVENOUS | Status: AC

## 2021-09-13 MED ORDER — SODIUM CHLORIDE 0.9% FLUSH
10.0000 mL | INTRAVENOUS | Status: DC | PRN
  Administered 2021-09-13: 10 mL

## 2021-09-13 MED ORDER — SODIUM CHLORIDE 0.9% FLUSH
10.0000 mL | Freq: Once | INTRAVENOUS | Status: AC
  Administered 2021-09-13: 10 mL

## 2021-09-13 NOTE — Patient Instructions (Signed)
[facility]  Discharge Instructions: Thank you for choosing [facility] to provide your oncology and hematology care.   If you have a lab appointment with the Cancer Center, please go directly to the Cancer Center and check in at the registration area.   Wear comfortable clothing and clothing appropriate for easy access to any Portacath or PICC line.   We strive to give you quality time with your provider. You may need to reschedule your appointment if you arrive late (15 or more minutes).  Arriving late affects you and other patients whose appointments are after yours.  Also, if you miss three or more appointments without notifying the office, you may be dismissed from the clinic at the provider's discretion.      For prescription refill requests, have your pharmacy contact our office and allow 72 hours for refills to be completed.    Today you received the following chemotherapy and/or immunotherapy agents: Padcev   To help prevent nausea and vomiting after your treatment, we encourage you to take your nausea medication as directed.  BELOW ARE SYMPTOMS THAT SHOULD BE REPORTED IMMEDIATELY: *FEVER GREATER THAN 100.4 F (38 C) OR HIGHER *CHILLS OR SWEATING *NAUSEA AND VOMITING THAT IS NOT CONTROLLED WITH YOUR NAUSEA MEDICATION *UNUSUAL SHORTNESS OF BREATH *UNUSUAL BRUISING OR BLEEDING *URINARY PROBLEMS (pain or burning when urinating, or frequent urination) *BOWEL PROBLEMS (unusual diarrhea, constipation, pain near the anus) TENDERNESS IN MOUTH AND THROAT WITH OR WITHOUT PRESENCE OF ULCERS (sore throat, sores in mouth, or a toothache) UNUSUAL RASH, SWELLING OR PAIN  UNUSUAL VAGINAL DISCHARGE OR ITCHING   Items with * indicate a potential emergency and should be followed up as soon as possible or go to the Emergency Department if any problems should occur.  Please show the CHEMOTHERAPY ALERT CARD or IMMUNOTHERAPY ALERT CARD at check-in to the  Emergency Department and triage nurse.  Should you have questions after your visit or need to cancel or reschedule your appointment, please contact [facility]  Dept: [phone]  and follow the prompts.  Office hours are 8:00 a.m. to 4:30 p.m. Monday - Friday. Please note that voicemails left after 4:00 p.m. may not be returned until the following business day.  We are closed weekends and major holidays. You have access to a nurse at all times for urgent questions. Please call the main number to the clinic Dept: [phone] and follow the prompts.   For any non-urgent questions, you may also contact your provider using MyChart. We now offer e-Visits for anyone 18 and older to request care online for non-urgent symptoms. For details visit [email or web address].   Also download the MyChart app! Go to the app store, search "MyChart", open the app, select , and log in with your MyChart username and password.  Due to Covid, a mask is required upon entering the hospital/clinic. If you do not have a mask, one will be given to you upon arrival. For doctor visits, patients may have 1 support person aged 18 or older with them. For treatment visits, patients cannot have anyone with them due to current Covid guidelines and our immunocompromised population.   

## 2021-09-13 NOTE — Progress Notes (Signed)
Hematology and Oncology Follow Up Visit  Rj Pedrosa Langseth [id number] [birth year] 71 y.o. 09/13/2021 12:34 PM [name], [name], MD   Principle Diagnosis: 71 year old man with stage IV high-grade urothelial carcinoma of the bladder with pelvic adenopathy documented in 2019.  He presented with localized disease in 2015.    Prior Therapy:  He underwent a cystoscopy and a TURBT on 05/17/2014.   Neoadjuvant systemic chemotherapy in the form of gemcitabine and cisplatin cycle 1 day 1 is on 06/17/2014. He is S/P two cycles completed in 07/2014.  Therapy tolerated poorly with symptoms of nausea and vomiting and worsening renal function.  He is status post robotic cystoprostatectomy and bilateral lymphadenectomy completed on September 09, 2014.  The final pathology revealed T2N0 without any lymph node involvement.  He developed pelvic adenopathy that was biopsy-proven on Jan 14, 2018 to be metastatic urothelial carcinoma.  Carboplatin and gemcitabine cycle 1 started on 02/17/2018.  He completed 8 cycles of therapy in December 2019.  Pembrolizumab 200 mg every 3 weeks started on April 01, 2019.  He is status post 7 cycles of therapy in December 2020.  Therapy stopped because of of patient preference, excellent clinical response and treatment holiday.  He developed progression of disease in June 2021.   Current therapy: Padcev started on February 11, 2020.  He is currently receiving it at  0.75 mg/kg once a months for maintenance purposes.  He is here for the next cycle of therapy.   Interim History: Mr. Toren is here for a follow-up evaluation.  Since the last visit, he reports no major changes in his health.  He continues to tolerate chemotherapy without any major complaints.  He denies any nausea, vomiting or abdominal pain.  He denies he denies any worsening neuropathy.  He still has lower extremity pain and swelling predominantly on the right thigh and leg.  Overall his mobility has  remained the same and overall quality of life is unchanged.              Medications: Reviewed without changes. Current Outpatient Medications  Medication Sig Dispense Refill   diphenoxylate-atropine (LOMOTIL) 2.5-0.025 MG tablet 1 to 2 PO QID prn diarrhea 45 tablet 2   furosemide (LASIX) 20 MG tablet Take 2 tablets (40 mg total) by mouth daily. 90 tablet 1   Insulin Glargine (BASAGLAR KWIKPEN) 100 UNIT/ML Inject 8 Units into the skin every morning. 9 mL 2   insulin lispro (HUMALOG KWIKPEN) 100 UNIT/ML KwikPen Inject 2 Units into the skin daily with supper. 1.8 mL 3   oxyCODONE-acetaminophen (PERCOCET) 10-325 MG tablet Take 1 tablet by mouth every 4 (four) hours as needed for pain. 90 tablet 0   pregabalin (LYRICA) 75 MG capsule Take 1 capsule (75 mg total) by mouth daily. 60 capsule 1   prochlorperazine (COMPAZINE) 10 MG tablet TAKE 1 TABLET(10 MG) BY MOUTH EVERY 6 HOURS AS NEEDED FOR NAUSEA OR VOMITING 30 tablet 0   sertraline (ZOLOFT) 25 MG tablet Take 1 tablet (25 mg total) by mouth at bedtime. 30 tablet 3   No current facility-administered medications for this visit.     Allergies: No Known Allergies    Physical Exam:   Blood pressure 126/75, pulse 77, temperature 97.8 F (36.6 C), temperature source Axillary, resp. rate 17, height 6\' 2"  (1.88 m), weight 186 lb 1.6 oz (84.4 kg), SpO2 99 %.        ECOG: 1    General appearance: Comfortable appearing without any discomfort Head: Normocephalic  without any trauma Oropharynx: Mucous membranes are moist and pink without any thrush or ulcers. Eyes: Pupils are equal and round reactive to light. Lymph nodes: No cervical, supraclavicular, inguinal or axillary lymphadenopathy.   Heart:regular rate and rhythm.  S1 and S2 without leg edema. Lung: Clear without any rhonchi or wheezes.  No dullness to percussion. Abdomin: Soft, nontender, nondistended with good bowel sounds.  No hepatosplenomegaly. Musculoskeletal: No  joint deformity or effusion.  Full range of motion noted. Neurological: No deficits noted on motor, sensory and deep tendon reflex exam. Skin: No petechial rash or dryness.  Appeared moist.                .   Lab Results: Lab Results  Component Value Date   WBC 7.6 08/16/2021   HGB 14.3 08/16/2021   HCT 40.7 08/16/2021   MCV 84.1 08/16/2021   PLT 186 08/16/2021     Chemistry        Impression and Plan:  71 year old man with:    1.  Stage IV high-grade urothelial carcinoma of the bladder with pelvic adenopathy in 2019.  He continues to be on palliative therapy utilizing Padcev without any major complaints.  Risks and benefits of continuing this treatments were reviewed at this time.  Potential complications including nausea, vomiting, neuropathy among others were reiterated.  His last PET scan obtained in December 2022 showed overall positive response to therapy.  I recommended continuing the same dose and schedule I will update his staging scans in 4 months.  He is agreeable to proceed.  2.  IV access: Port-A-Cath currently in use without any issues.  3.  Pelvic and lower extremity pain: Related to his malignancy and currently manageable with the current pain medication.  He is taken Percocet with pain is manageable.  4.  Prognosis: Therapy remains palliative although aggressive measures are warranted given his reasonable performance status.   5.  Lymphedema of the right thigh: He continues to be evaluated by occupational therapy regarding this issue.  He was advised to use compression stocking in which he will consider in the near future.   6. Followup: In 4 weeks for a follow-up evaluation.   30  minutes were dedicated to this encounter.  Time spent on reviewing laboratory data, disease status update and outlining future plan of care discussion.   [name], MD 1/12/202312:34 PM

## 2021-10-02 ENCOUNTER — Other Ambulatory Visit: Payer: Self-pay | Admitting: Oncology

## 2021-10-02 DIAGNOSIS — C679 Malignant neoplasm of bladder, unspecified: Secondary | ICD-10-CM

## 2021-10-02 MED ORDER — OXYCODONE-ACETAMINOPHEN 10-325 MG PO TABS: 1.0000 | ORAL_TABLET | ORAL | 0 refills | Status: DC | PRN

## 2021-10-05 ENCOUNTER — Telehealth: Payer: Self-pay | Admitting: Oncology

## 2021-10-05 NOTE — Telephone Encounter (Signed)
Called patient regarding upcoming appointments, patient is notified. 

## 2021-10-10 MED FILL — Dexamethasone Sodium Phosphate Inj 100 MG/10ML: INTRAMUSCULAR | Qty: 1 | Status: AC

## 2021-10-11 ENCOUNTER — Other Ambulatory Visit: Payer: Self-pay

## 2021-10-11 ENCOUNTER — Inpatient Hospital Stay: Payer: Medicare Other | Attending: Oncology

## 2021-10-11 ENCOUNTER — Inpatient Hospital Stay: Payer: Medicare Other

## 2021-10-11 VITALS — BP 142/75 | HR 62 | Temp 98.1°F | Resp 18 | Wt 183.2 lb

## 2021-10-11 DIAGNOSIS — Z95828 Presence of other vascular implants and grafts: Secondary | ICD-10-CM

## 2021-10-11 DIAGNOSIS — C679 Malignant neoplasm of bladder, unspecified: Secondary | ICD-10-CM

## 2021-10-11 DIAGNOSIS — Z5112 Encounter for antineoplastic immunotherapy: Secondary | ICD-10-CM | POA: Diagnosis not present

## 2021-10-11 LAB — CBC WITH DIFFERENTIAL (CANCER CENTER ONLY)
Abs Immature Granulocytes: 0.02 10*3/uL (ref 0.00–0.07)
Basophils Absolute: 0 10*3/uL (ref 0.0–0.1)
Basophils Relative: 1 %
Eosinophils Absolute: 0.1 10*3/uL (ref 0.0–0.5)
Eosinophils Relative: 1 %
HCT: 40.5 % (ref 39.0–52.0)
Hemoglobin: 14.3 g/dL (ref 13.0–17.0)
Immature Granulocytes: 0 %
Lymphocytes Relative: 34 %
Lymphs Abs: 2 10*3/uL (ref 0.7–4.0)
MCH: 29.9 pg (ref 26.0–34.0)
MCHC: 35.3 g/dL (ref 30.0–36.0)
MCV: 84.7 fL (ref 80.0–100.0)
Monocytes Absolute: 0.5 10*3/uL (ref 0.1–1.0)
Monocytes Relative: 9 %
Neutro Abs: 3.2 10*3/uL (ref 1.7–7.7)
Neutrophils Relative %: 55 %
Platelet Count: 199 10*3/uL (ref 150–400)
RBC: 4.78 MIL/uL (ref 4.22–5.81)
RDW: 12.6 % (ref 11.5–15.5)
WBC Count: 5.8 10*3/uL (ref 4.0–10.5)
nRBC: 0 % (ref 0.0–0.2)

## 2021-10-11 LAB — CMP (CANCER CENTER ONLY)
ALT: 23 U/L (ref 0–44)
AST: 21 U/L (ref 15–41)
Albumin: 3.9 g/dL (ref 3.5–5.0)
Alkaline Phosphatase: 66 U/L (ref 38–126)
Anion gap: 4 — ABNORMAL LOW (ref 5–15)
BUN: 23 mg/dL (ref 8–23)
CO2: 29 mmol/L (ref 22–32)
Calcium: 8.7 mg/dL — ABNORMAL LOW (ref 8.9–10.3)
Chloride: 103 mmol/L (ref 98–111)
Creatinine: 1.07 mg/dL (ref 0.61–1.24)
GFR, Estimated: >60 mL/min (ref >60)
Glucose, Bld: 161 mg/dL — ABNORMAL HIGH (ref 70–99)
Potassium: 4.5 mmol/L (ref 3.5–5.1)
Sodium: 136 mmol/L (ref 135–145)
Total Bilirubin: 0.5 mg/dL (ref 0.3–1.2)
Total Protein: 6.5 g/dL (ref 6.5–8.1)

## 2021-10-11 MED ORDER — HEPARIN SOD (PORK) LOCK FLUSH 100 UNIT/ML IV SOLN
500.0000 [IU] | Freq: Once | INTRAVENOUS | Status: AC | PRN
  Administered 2021-10-11: 500 [IU]

## 2021-10-11 MED ORDER — SODIUM CHLORIDE 0.9 % IV SOLN
0.8000 mg/kg | Freq: Once | INTRAVENOUS | Status: AC
  Administered 2021-10-11: 60 mg via INTRAVENOUS
  Filled 2021-10-11: qty 6

## 2021-10-11 MED ORDER — SODIUM CHLORIDE 0.9% FLUSH
10.0000 mL | INTRAVENOUS | Status: DC | PRN
  Administered 2021-10-11: 10 mL

## 2021-10-11 MED ORDER — SODIUM CHLORIDE 0.9 % IV SOLN: Freq: Once | INTRAVENOUS | Status: AC

## 2021-10-11 MED ORDER — SODIUM CHLORIDE 0.9 % IV SOLN
10.0000 mg | Freq: Once | INTRAVENOUS | Status: AC
  Administered 2021-10-11: 10 mg via INTRAVENOUS
  Filled 2021-10-11: qty 10

## 2021-10-11 MED ORDER — SODIUM CHLORIDE 0.9% FLUSH
10.0000 mL | Freq: Once | INTRAVENOUS | Status: AC
  Administered 2021-10-11: 10 mL

## 2021-10-11 MED ORDER — PROCHLORPERAZINE MALEATE 10 MG PO TABS
10.0000 mg | ORAL_TABLET | Freq: Once | ORAL | Status: AC
  Administered 2021-10-11: 10 mg via ORAL
  Filled 2021-10-11: qty 1

## 2021-10-11 NOTE — Progress Notes (Signed)
MD aware of increased neuropathy, ok to proceed with treatment today.

## 2021-10-11 NOTE — Patient Instructions (Signed)
[facility]  Discharge Instructions: Thank you for choosing [facility] to provide your oncology and hematology care.   If you have a lab appointment with the Cancer Center, please go directly to the Cancer Center and check in at the registration area.   Wear comfortable clothing and clothing appropriate for easy access to any Portacath or PICC line.   We strive to give you quality time with your provider. You may need to reschedule your appointment if you arrive late (15 or more minutes).  Arriving late affects you and other patients whose appointments are after yours.  Also, if you miss three or more appointments without notifying the office, you may be dismissed from the clinic at the provider's discretion.      For prescription refill requests, have your pharmacy contact our office and allow 72 hours for refills to be completed.    Today you received the following chemotherapy and/or immunotherapy agents: Padcev   To help prevent nausea and vomiting after your treatment, we encourage you to take your nausea medication as directed.  BELOW ARE SYMPTOMS THAT SHOULD BE REPORTED IMMEDIATELY: *FEVER GREATER THAN 100.4 F (38 C) OR HIGHER *CHILLS OR SWEATING *NAUSEA AND VOMITING THAT IS NOT CONTROLLED WITH YOUR NAUSEA MEDICATION *UNUSUAL SHORTNESS OF BREATH *UNUSUAL BRUISING OR BLEEDING *URINARY PROBLEMS (pain or burning when urinating, or frequent urination) *BOWEL PROBLEMS (unusual diarrhea, constipation, pain near the anus) TENDERNESS IN MOUTH AND THROAT WITH OR WITHOUT PRESENCE OF ULCERS (sore throat, sores in mouth, or a toothache) UNUSUAL RASH, SWELLING OR PAIN  UNUSUAL VAGINAL DISCHARGE OR ITCHING   Items with * indicate a potential emergency and should be followed up as soon as possible or go to the Emergency Department if any problems should occur.  Please show the CHEMOTHERAPY ALERT CARD or IMMUNOTHERAPY ALERT CARD at check-in to the  Emergency Department and triage nurse.  Should you have questions after your visit or need to cancel or reschedule your appointment, please contact [facility]  Dept: [phone]  and follow the prompts.  Office hours are 8:00 a.m. to 4:30 p.m. Monday - Friday. Please note that voicemails left after 4:00 p.m. may not be returned until the following business day.  We are closed weekends and major holidays. You have access to a nurse at all times for urgent questions. Please call the main number to the clinic Dept: [phone] and follow the prompts.   For any non-urgent questions, you may also contact your provider using MyChart. We now offer e-Visits for anyone 18 and older to request care online for non-urgent symptoms. For details visit [email or web address].   Also download the MyChart app! Go to the app store, search "MyChart", open the app, select , and log in with your MyChart username and password.  Due to Covid, a mask is required upon entering the hospital/clinic. If you do not have a mask, one will be given to you upon arrival. For doctor visits, patients may have 1 support person aged 18 or older with them. For treatment visits, patients cannot have anyone with them due to current Covid guidelines and our immunocompromised population.   

## 2021-10-23 ENCOUNTER — Other Ambulatory Visit: Payer: Self-pay | Admitting: Oncology

## 2021-10-23 ENCOUNTER — Telehealth: Payer: Self-pay | Admitting: *Deleted

## 2021-10-23 DIAGNOSIS — C679 Malignant neoplasm of bladder, unspecified: Secondary | ICD-10-CM

## 2021-10-23 NOTE — Telephone Encounter (Signed)
-----   Message from Wyatt Portela, MD sent at 10/23/2021 12:27 PM EST ----- I will arrange for scan to be done soon. Thanks ----- Message ----- From: Rolene Course, RN Sent: 10/23/2021  12:06 PM EST To: Wyatt Portela, MD  This patient called today & is C/O lower abdominal pain for the past week, states at first it felt like "someone had punched him in the stomach."  He also says it is a lot of pressure as if he needs to have a BM but he has been having regular BM's so that is not the problem.  States it is aggravated by lying on his back, feels some better sitting up, Percocet helps some but the pain doesn't completely go away.  Does not report any nausea or vomiting.  He is asking if you think he needs an XR or scan.   Please advise.  Thanks, Bethena Roys

## 2021-10-23 NOTE — Telephone Encounter (Signed)
PC to patient, no answer, left VM - informed patient Dr. Alen Blew has ordered a CT of abdomen & pelvis, [facility] should be calling him within a few days to schedule.  Instructed patient to call this office if he hasn't received a scheduling call by Friday [phone].

## 2021-10-25 ENCOUNTER — Other Ambulatory Visit: Payer: Self-pay

## 2021-10-25 ENCOUNTER — Ambulatory Visit (HOSPITAL_COMMUNITY)
Admission: RE | Admit: 2021-10-25 | Discharge: 2021-10-25 | Disposition: A | Discharge status: Home / Self Care | Payer: Medicare Other | Source: Ambulatory Visit | Attending: Oncology | Admitting: Oncology

## 2021-10-25 DIAGNOSIS — C679 Malignant neoplasm of bladder, unspecified: Secondary | ICD-10-CM | POA: Insufficient documentation

## 2021-10-25 MED ORDER — SODIUM CHLORIDE (PF) 0.9 % IJ SOLN
INTRAMUSCULAR | Status: AC
  Filled 2021-10-25: qty 50

## 2021-10-25 MED ORDER — HEPARIN SOD (PORK) LOCK FLUSH 100 UNIT/ML IV SOLN
INTRAVENOUS | Status: AC
  Filled 2021-10-25: qty 5

## 2021-10-25 MED ORDER — HEPARIN SOD (PORK) LOCK FLUSH 100 UNIT/ML IV SOLN: 500.0000 [IU] | Freq: Once | INTRAVENOUS | Status: DC

## 2021-10-25 MED ORDER — IOHEXOL 300 MG/ML  SOLN
100.0000 mL | Freq: Once | INTRAMUSCULAR | Status: AC | PRN
  Administered 2021-10-25: 100 mL via INTRAVENOUS

## 2021-10-29 ENCOUNTER — Telehealth: Payer: Self-pay

## 2021-10-29 NOTE — Telephone Encounter (Signed)
Call from patient requesting refill on Percocet and also requesting results of CT scan from 10/25/21. Left VM stating that these requests would be addressed on 10/26/21.

## 2021-10-30 ENCOUNTER — Other Ambulatory Visit: Payer: Self-pay | Admitting: Oncology

## 2021-10-30 DIAGNOSIS — C679 Malignant neoplasm of bladder, unspecified: Secondary | ICD-10-CM

## 2021-10-30 MED ORDER — OXYCODONE-ACETAMINOPHEN 10-325 MG PO TABS: 1.0000 | ORAL_TABLET | ORAL | 0 refills | Status: DC | PRN

## 2021-10-31 ENCOUNTER — Other Ambulatory Visit: Payer: Self-pay | Admitting: Oncology

## 2021-10-31 ENCOUNTER — Telehealth: Payer: Self-pay

## 2021-10-31 DIAGNOSIS — C679 Malignant neoplasm of bladder, unspecified: Secondary | ICD-10-CM

## 2021-10-31 NOTE — Telephone Encounter (Signed)
Pt called requesting results from CT on 10/25/2021. Patient has next apt scheduled 11/15/21. ?

## 2021-11-02 ENCOUNTER — Telehealth: Payer: Self-pay | Admitting: Oncology

## 2021-11-02 NOTE — Telephone Encounter (Signed)
.  Called patient to schedule appointment per 3/2 inbasket, patient is aware of date and time.  ? ?

## 2021-11-14 MED FILL — Dexamethasone Sodium Phosphate Inj 100 MG/10ML: INTRAMUSCULAR | Qty: 1 | Status: AC

## 2021-11-15 ENCOUNTER — Other Ambulatory Visit: Payer: Self-pay

## 2021-11-15 ENCOUNTER — Encounter: Payer: Self-pay | Admitting: Oncology

## 2021-11-15 ENCOUNTER — Inpatient Hospital Stay: Payer: Medicare Other

## 2021-11-15 ENCOUNTER — Inpatient Hospital Stay (HOSPITAL_BASED_OUTPATIENT_CLINIC_OR_DEPARTMENT_OTHER): Payer: Medicare Other | Admitting: Oncology

## 2021-11-15 ENCOUNTER — Inpatient Hospital Stay: Payer: Medicare Other | Attending: Oncology

## 2021-11-15 VITALS — BP 120/67 | HR 79 | Temp 97.8°F | Resp 16 | Ht 74.0 in | Wt 181.0 lb

## 2021-11-15 DIAGNOSIS — Z5112 Encounter for antineoplastic immunotherapy: Secondary | ICD-10-CM | POA: Diagnosis not present

## 2021-11-15 DIAGNOSIS — Z95828 Presence of other vascular implants and grafts: Secondary | ICD-10-CM

## 2021-11-15 DIAGNOSIS — R609 Edema, unspecified: Secondary | ICD-10-CM | POA: Diagnosis not present

## 2021-11-15 DIAGNOSIS — C679 Malignant neoplasm of bladder, unspecified: Secondary | ICD-10-CM | POA: Insufficient documentation

## 2021-11-15 DIAGNOSIS — M79606 Pain in leg, unspecified: Secondary | ICD-10-CM | POA: Insufficient documentation

## 2021-11-15 DIAGNOSIS — C778 Secondary and unspecified malignant neoplasm of lymph nodes of multiple regions: Secondary | ICD-10-CM | POA: Diagnosis not present

## 2021-11-15 DIAGNOSIS — Z79899 Other long term (current) drug therapy: Secondary | ICD-10-CM | POA: Insufficient documentation

## 2021-11-15 LAB — CBC WITH DIFFERENTIAL (CANCER CENTER ONLY)
Abs Immature Granulocytes: 0.01 10*3/uL (ref 0.00–0.07)
Basophils Absolute: 0 10*3/uL (ref 0.0–0.1)
Basophils Relative: 1 %
Eosinophils Absolute: 0.1 10*3/uL (ref 0.0–0.5)
Eosinophils Relative: 1 %
HCT: 39.2 % (ref 39.0–52.0)
Hemoglobin: 14.1 g/dL (ref 13.0–17.0)
Immature Granulocytes: 0 %
Lymphocytes Relative: 27 %
Lymphs Abs: 1.8 10*3/uL (ref 0.7–4.0)
MCH: 29.8 pg (ref 26.0–34.0)
MCHC: 36 g/dL (ref 30.0–36.0)
MCV: 82.9 fL (ref 80.0–100.0)
Monocytes Absolute: 0.6 10*3/uL (ref 0.1–1.0)
Monocytes Relative: 9 %
Neutro Abs: 4.1 10*3/uL (ref 1.7–7.7)
Neutrophils Relative %: 62 %
Platelet Count: 213 10*3/uL (ref 150–400)
RBC: 4.73 MIL/uL (ref 4.22–5.81)
RDW: 12.6 % (ref 11.5–15.5)
WBC Count: 6.5 10*3/uL (ref 4.0–10.5)
nRBC: 0 % (ref 0.0–0.2)

## 2021-11-15 LAB — CMP (CANCER CENTER ONLY)
ALT: 29 U/L (ref 0–44)
AST: 27 U/L (ref 15–41)
Albumin: 3.9 g/dL (ref 3.5–5.0)
Alkaline Phosphatase: 58 U/L (ref 38–126)
Anion gap: 5 (ref 5–15)
BUN: 23 mg/dL (ref 8–23)
CO2: 29 mmol/L (ref 22–32)
Calcium: 9 mg/dL (ref 8.9–10.3)
Chloride: 103 mmol/L (ref 98–111)
Creatinine: 1.08 mg/dL (ref 0.61–1.24)
GFR, Estimated: >60 mL/min (ref >60)
Glucose, Bld: 144 mg/dL — ABNORMAL HIGH (ref 70–99)
Potassium: 4.1 mmol/L (ref 3.5–5.1)
Sodium: 137 mmol/L (ref 135–145)
Total Bilirubin: 0.6 mg/dL (ref 0.3–1.2)
Total Protein: 6.6 g/dL (ref 6.5–8.1)

## 2021-11-15 MED ORDER — HEPARIN SOD (PORK) LOCK FLUSH 100 UNIT/ML IV SOLN
500.0000 [IU] | Freq: Once | INTRAVENOUS | Status: AC | PRN
  Administered 2021-11-15: 500 [IU]

## 2021-11-15 MED ORDER — SODIUM CHLORIDE 0.9 % IV SOLN
0.8000 mg/kg | Freq: Once | INTRAVENOUS | Status: AC
  Administered 2021-11-15: 60 mg via INTRAVENOUS
  Filled 2021-11-15: qty 6

## 2021-11-15 MED ORDER — SODIUM CHLORIDE 0.9% FLUSH
10.0000 mL | INTRAVENOUS | Status: DC | PRN
  Administered 2021-11-15: 10 mL

## 2021-11-15 MED ORDER — SODIUM CHLORIDE 0.9% FLUSH
10.0000 mL | Freq: Once | INTRAVENOUS | Status: AC
  Administered 2021-11-15: 10 mL

## 2021-11-15 MED ORDER — SODIUM CHLORIDE 0.9 % IV SOLN
10.0000 mg | Freq: Once | INTRAVENOUS | Status: AC
  Administered 2021-11-15: 10 mg via INTRAVENOUS
  Filled 2021-11-15: qty 10

## 2021-11-15 MED ORDER — MORPHINE SULFATE ER 30 MG PO TBCR: 30.0000 mg | EXTENDED_RELEASE_TABLET | Freq: Two times a day (BID) | ORAL | 0 refills | Status: DC

## 2021-11-15 MED ORDER — SODIUM CHLORIDE 0.9 % IV SOLN: Freq: Once | INTRAVENOUS | Status: AC

## 2021-11-15 MED ORDER — PROCHLORPERAZINE MALEATE 10 MG PO TABS
10.0000 mg | ORAL_TABLET | Freq: Once | ORAL | Status: AC
  Administered 2021-11-15: 10 mg via ORAL
  Filled 2021-11-15: qty 1

## 2021-11-15 NOTE — Progress Notes (Signed)
Hematology and Oncology Follow Up Visit ? ?Danny Wood ?[id number] ?10/19/1950 71 y.o. ?11/15/2021 12:08 PM ?Danny Magic, MD  ? ?Principle Diagnosis: 71 year old man with bladder cancer diagnosed in 2015.  He developed stage IV high-grade urothelial carcinoma with pelvic adenopathy. ? ?Prior Therapy:  ?He underwent a cystoscopy and a TURBT on 05/17/2014.  ? ?Neoadjuvant systemic chemotherapy in the form of gemcitabine and cisplatin cycle 1 day 1 is on 06/17/2014. He is S/P two cycles completed in 07/2014.  Therapy tolerated poorly with symptoms of nausea and vomiting and worsening renal function. ? ?He is status post robotic cystoprostatectomy and bilateral lymphadenectomy completed on September 09, 2014.  The final pathology revealed T2N0 without any lymph node involvement. ? ?He developed pelvic adenopathy that was biopsy-proven on Jan 14, 2018 to be metastatic urothelial carcinoma. ? ?Carboplatin and gemcitabine cycle 1 started on 02/17/2018.  He completed 8 cycles of therapy in December 2019. ? ?Pembrolizumab 200 mg every 3 weeks started on April 01, 2019.  He is status post 7 cycles of therapy in December 2020.  Therapy stopped because of of patient preference, excellent clinical response and treatment holiday.  He developed progression of disease in June 2021. ? ? ?Current therapy: Padcev started on February 11, 2020.  He has been receiving monthly maintenance therapy up till March 2023.  Therapy will be switched to weekly for 3 weeks out of 4-week cycle starting on November 15, 2021. ? ? ?Interim History: Danny Wood returns today for repeat follow-up.  Since last visit, he reports no major changes although he has described increasing pain in his pelvis and lower extremities.  Despite using Percocet 4 times a day he still requiring more pain medication at this time.  He denies any nausea, vomiting or abdominal pain.  He denies any worsening  neuropathy. ? ? ? ? ? ? ? ? ? ? ? ? ? ?Medications: Updated on review.Marland Kitchen ?Current Outpatient Medications  ?Medication Sig Dispense Refill  ? diphenoxylate-atropine (LOMOTIL) 2.5-0.025 MG tablet 1 to 2 PO QID prn diarrhea 45 tablet 2  ? furosemide (LASIX) 20 MG tablet Take 2 tablets (40 mg total) by mouth daily. 90 tablet 1  ? Insulin Glargine (BASAGLAR KWIKPEN) 100 UNIT/ML Inject 8 Units into the skin every morning. 9 mL 2  ? insulin lispro (HUMALOG KWIKPEN) 100 UNIT/ML KwikPen Inject 2 Units into the skin daily with supper. 1.8 mL 3  ? oxyCODONE-acetaminophen (PERCOCET) 10-325 MG tablet Take 1 tablet by mouth every 4 (four) hours as needed for pain. 90 tablet 0  ? prochlorperazine (COMPAZINE) 10 MG tablet TAKE 1 TABLET(10 MG) BY MOUTH EVERY 6 HOURS AS NEEDED FOR NAUSEA OR VOMITING 30 tablet 0  ? sertraline (ZOLOFT) 25 MG tablet Take 1 tablet (25 mg total) by mouth at bedtime. 30 tablet 3  ? ?No current facility-administered medications for this visit.  ? ? ? ?Allergies: No Known Allergies ? ? ? ?Physical Exam: ? ? ? ?Blood pressure 120/67, pulse 79, temperature 97.8 ?F (36.6 ?C), temperature source Temporal, resp. rate 16, height '6\' 2"'$  (1.88 m), weight 181 lb (82.1 kg), SpO2 99 %. ? ? ? ? ? ? ? ?ECOG: 1 ? ? ?General appearance: Alert, awake without any distress. ?Head: Atraumatic without abnormalities ?Oropharynx: Without any thrush or ulcers. ?Eyes: No scleral icterus. ?Lymph nodes: No lymphadenopathy noted in the cervical, supraclavicular, or axillary nodes ?Heart:regular rate and rhythm, without any murmurs or gallops.   ?Lung: Clear to auscultation without any rhonchi, wheezes  or dullness to percussion. ?Abdomin: Soft, nontender without any shifting dullness or ascites. ?Musculoskeletal: No clubbing or cyanosis. ?Neurological: No motor or sensory deficits. ?Skin: No rashes or lesions. ? ? ? ? ? ? ? ? ? ? ? ? ? ? ? ?.  ? ?Lab Results: ?Lab Results  ?Component Value Date  ? WBC 5.8 10/11/2021  ? HGB 14.3  10/11/2021  ? HCT 40.5 10/11/2021  ? MCV 84.7 10/11/2021  ? PLT 199 10/11/2021  ? ?  Chemistry   ? ? ? ?IMPRESSION: ?1. Progressive abdominal tumor with enlarging lymph nodes and ?mesenteric soft tissue implants. ?2. Status post cystoprostatectomy with right lower quadrant ileal ?conduit/ileostomy. ?3. Stable marked subcutaneous soft tissue swelling/edema/fluid in ?the suprapubic, pubic and penile region possibly related to ?radiation change. ?4. Small volume pelvic ascites. ?5. No findings suspicious for osseous metastatic disease. ? ?Impression and Plan: ? ?71 year old man with: ? ? ? ?1.  Bladder cancer diagnosed in 2015.  He developed stage IV high-grade urothelial carcinoma with lymphadenopathy in 2019. ? ?CT scan obtained on October 25, 2021 was personally reviewed and showed progressive lymphadenopathy and peritoneal implant that is consistent with metastatic progression.  Treatment choices moving forward were discussed including restarting Padcev at a more frequent dosing versus change of therapy.  After discussion today we opted to proceed with Padcev given the excellent response he had with weekly therapy.  Different treatment options including ?Danny Wood versus erdafitinib if he harbors the appropriate mutation.  NexGen ration sequencing is currently pending from today. ? ? ?2.  IV access: Port-A-Cath continues to be in use at this time. ? ?3.  Pelvic and lower extremity pain: His pain is not manageable at this time with Percocet.  Risks and benefits of adding long-acting morphine which she will start at nighttime and twice a day every day.  He will continue to use Percocet as needed.  Complication clinic constipation, lethargy among others were reiterated with instructions how to use it. ? ?4.  Prognosis: His disease is incurable although aggressive measures are warranted. ? ? ? ?5.  Lymphedema of the right thigh: Related to previous malignancy and treatments.  I recommended continued follow-up with  occupational therapy. ? ? ?6. Followup: He will follow weekly to complete the current cycle of therapy and in 4 weeks for repeat evaluation. ? ? ?30  minutes were spent on this visit.  The time was dedicated to reviewing laboratory data, disease status update and outlining future plan of care. ? ? ?[name], MD ?3/16/202312:08 PM ?

## 2021-11-15 NOTE — Patient Instructions (Signed)
[facility]  Discharge Instructions: ?Thank you for choosing [facility] to provide your oncology and hematology care.  ? ?If you have a lab appointment with the [facility], please go directly to the [facility] and check in at the registration area. ?  ?Wear comfortable clothing and clothing appropriate for easy access to any Portacath or PICC line.  ? ?We strive to give you quality time with your provider. You may need to reschedule your appointment if you arrive late (15 or more minutes).  Arriving late affects you and other patients whose appointments are after yours.  Also, if you miss three or more appointments without notifying the office, you may be dismissed from the clinic at the provider?s discretion.    ?  ?For prescription refill requests, have your pharmacy contact our office and allow 72 hours for refills to be completed.   ? ?Today you received the following chemotherapy and/or immunotherapy agents: Enfortumab.    ?  ?To help prevent nausea and vomiting after your treatment, we encourage you to take your nausea medication as directed. ? ?BELOW ARE SYMPTOMS THAT SHOULD BE REPORTED IMMEDIATELY: ?*FEVER GREATER THAN 100.4 F (38 ?C) OR HIGHER ?*CHILLS OR SWEATING ?*NAUSEA AND VOMITING THAT IS NOT CONTROLLED WITH YOUR NAUSEA MEDICATION ?*UNUSUAL SHORTNESS OF BREATH ?*UNUSUAL BRUISING OR BLEEDING ?*URINARY PROBLEMS (pain or burning when urinating, or frequent urination) ?*BOWEL PROBLEMS (unusual diarrhea, constipation, pain near the anus) ?TENDERNESS IN MOUTH AND THROAT WITH OR WITHOUT PRESENCE OF ULCERS (sore throat, sores in mouth, or a toothache) ?UNUSUAL RASH, SWELLING OR PAIN  ?UNUSUAL VAGINAL DISCHARGE OR ITCHING  ? ?Items with * indicate a potential emergency and should be followed up as soon as possible or go to the Emergency Department if any problems should occur. ? ?Please show the CHEMOTHERAPY ALERT CARD or IMMUNOTHERAPY ALERT CARD at check-in  to the Emergency Department and triage nurse. ? ?Should you have questions after your visit or need to cancel or reschedule your appointment, please contact [facility]  Dept: [phone]  and follow the prompts.  Office hours are 8:00 a.m. to 4:30 p.m. Monday - Friday. Please note that voicemails left after 4:00 p.m. may not be returned until the following business day.  We are closed weekends and major holidays. You have access to a nurse at all times for urgent questions. Please call the main number to the clinic Dept: [phone] and follow the prompts. ? ? ?For any non-urgent questions, you may also contact your provider using MyChart. We now offer e-Visits for anyone 3 and older to request care online for non-urgent symptoms. For details visit [email or web address]. ?  ?Also download the MyChart app! Go to the app store, search "MyChart", open the app, select [facility], and log in with your MyChart username and password. ? ?Due to Covid, a mask is required upon entering the hospital/clinic. If you do not have a mask, one will be given to you upon arrival. For doctor visits, patients may have 1 support person aged 104 or older with them. For treatment visits, patients cannot have anyone with them due to current Covid guidelines and our immunocompromised population.  ? ?

## 2021-11-22 ENCOUNTER — Inpatient Hospital Stay: Payer: Medicare Other

## 2021-11-22 ENCOUNTER — Other Ambulatory Visit: Payer: Self-pay

## 2021-11-22 ENCOUNTER — Other Ambulatory Visit: Payer: Self-pay | Admitting: Oncology

## 2021-11-22 VITALS — BP 142/82 | HR 52 | Temp 97.6°F | Resp 20 | Ht 74.0 in | Wt 179.0 lb

## 2021-11-22 DIAGNOSIS — C679 Malignant neoplasm of bladder, unspecified: Secondary | ICD-10-CM

## 2021-11-22 DIAGNOSIS — Z95828 Presence of other vascular implants and grafts: Secondary | ICD-10-CM

## 2021-11-22 DIAGNOSIS — Z5112 Encounter for antineoplastic immunotherapy: Secondary | ICD-10-CM | POA: Diagnosis not present

## 2021-11-22 LAB — CBC WITH DIFFERENTIAL (CANCER CENTER ONLY)
Abs Immature Granulocytes: 0.02 10*3/uL (ref 0.00–0.07)
Basophils Absolute: 0 10*3/uL (ref 0.0–0.1)
Basophils Relative: 0 %
Eosinophils Absolute: 0.1 10*3/uL (ref 0.0–0.5)
Eosinophils Relative: 1 %
HCT: 41 % (ref 39.0–52.0)
Hemoglobin: 14.4 g/dL (ref 13.0–17.0)
Immature Granulocytes: 0 %
Lymphocytes Relative: 25 %
Lymphs Abs: 1.9 10*3/uL (ref 0.7–4.0)
MCH: 29.5 pg (ref 26.0–34.0)
MCHC: 35.1 g/dL (ref 30.0–36.0)
MCV: 84 fL (ref 80.0–100.0)
Monocytes Absolute: 0.6 10*3/uL (ref 0.1–1.0)
Monocytes Relative: 9 %
Neutro Abs: 4.8 10*3/uL (ref 1.7–7.7)
Neutrophils Relative %: 65 %
Platelet Count: 208 10*3/uL (ref 150–400)
RBC: 4.88 MIL/uL (ref 4.22–5.81)
RDW: 12.5 % (ref 11.5–15.5)
WBC Count: 7.4 10*3/uL (ref 4.0–10.5)
nRBC: 0 % (ref 0.0–0.2)

## 2021-11-22 LAB — CMP (CANCER CENTER ONLY)
ALT: 27 U/L (ref 0–44)
AST: 22 U/L (ref 15–41)
Albumin: 3.8 g/dL (ref 3.5–5.0)
Alkaline Phosphatase: 62 U/L (ref 38–126)
Anion gap: 4 — ABNORMAL LOW (ref 5–15)
BUN: 18 mg/dL (ref 8–23)
CO2: 30 mmol/L (ref 22–32)
Calcium: 8.9 mg/dL (ref 8.9–10.3)
Chloride: 102 mmol/L (ref 98–111)
Creatinine: 1.01 mg/dL (ref 0.61–1.24)
GFR, Estimated: >60 mL/min (ref >60)
Glucose, Bld: 182 mg/dL — ABNORMAL HIGH (ref 70–99)
Potassium: 4.3 mmol/L (ref 3.5–5.1)
Sodium: 136 mmol/L (ref 135–145)
Total Bilirubin: 0.5 mg/dL (ref 0.3–1.2)
Total Protein: 6.4 g/dL — ABNORMAL LOW (ref 6.5–8.1)

## 2021-11-22 MED ORDER — HEPARIN SOD (PORK) LOCK FLUSH 100 UNIT/ML IV SOLN
500.0000 [IU] | Freq: Once | INTRAVENOUS | Status: AC | PRN
  Administered 2021-11-22: 500 [IU]

## 2021-11-22 MED ORDER — SODIUM CHLORIDE 0.9 % IV SOLN: Freq: Once | INTRAVENOUS | Status: AC

## 2021-11-22 MED ORDER — SODIUM CHLORIDE 0.9 % IV SOLN
0.8000 mg/kg | Freq: Once | INTRAVENOUS | Status: AC
  Administered 2021-11-22: 60 mg via INTRAVENOUS
  Filled 2021-11-22: qty 6

## 2021-11-22 MED ORDER — SODIUM CHLORIDE 0.9% FLUSH
10.0000 mL | INTRAVENOUS | Status: DC | PRN
  Administered 2021-11-22: 10 mL

## 2021-11-22 MED ORDER — OXYCODONE-ACETAMINOPHEN 10-325 MG PO TABS: 1.0000 | ORAL_TABLET | ORAL | 0 refills | Status: DC | PRN

## 2021-11-22 MED ORDER — PROCHLORPERAZINE MALEATE 10 MG PO TABS
10.0000 mg | ORAL_TABLET | Freq: Once | ORAL | Status: AC
  Administered 2021-11-22: 10 mg via ORAL
  Filled 2021-11-22: qty 1

## 2021-11-22 MED ORDER — SODIUM CHLORIDE 0.9 % IV SOLN
10.0000 mg | Freq: Once | INTRAVENOUS | Status: AC
  Administered 2021-11-22: 10 mg via INTRAVENOUS
  Filled 2021-11-22: qty 10

## 2021-11-22 MED ORDER — SODIUM CHLORIDE 0.9% FLUSH
10.0000 mL | Freq: Once | INTRAVENOUS | Status: AC
  Administered 2021-11-22: 10 mL

## 2021-11-22 NOTE — Progress Notes (Signed)
Labs reviewed , will proceed with plan. Patient asked for pain medication refill sent PM to MD's nurse.  ? ?Treatment given per orders. Patient tolerated it well without problems. Vitals stable and discharged home from clinic ambulatory. Follow up as scheduled. ? ?

## 2021-11-22 NOTE — Patient Instructions (Signed)
[facility] MEDICAL ONCOLOGY  Discharge Instructions: °Thank you for choosing [facility] to provide your oncology and hematology care.  ° °If you have a lab appointment with the Cancer Center, please go directly to the Cancer Center and check in at the registration area. °  ° ° °We strive to give you quality time with your provider. You may need to reschedule your appointment if you arrive late (15 or more minutes).  Arriving late affects you and other patients whose appointments are after yours.  Also, if you miss three or more appointments without notifying the office, you may be dismissed from the clinic at the provider’s discretion.    °  °For prescription refill requests, have your pharmacy contact our office and allow 72 hours for refills to be completed.   ° ° °  °To help prevent nausea and vomiting after your treatment, we encourage you to take your nausea medication as directed. ° °BELOW ARE SYMPTOMS THAT SHOULD BE REPORTED IMMEDIATELY: °*FEVER GREATER THAN 100.4 F (38 °C) OR HIGHER °*CHILLS OR SWEATING °*NAUSEA AND VOMITING THAT IS NOT CONTROLLED WITH YOUR NAUSEA MEDICATION °*UNUSUAL SHORTNESS OF BREATH °*UNUSUAL BRUISING OR BLEEDING °*URINARY PROBLEMS (pain or burning when urinating, or frequent urination) °*BOWEL PROBLEMS (unusual diarrhea, constipation, pain near the anus) °TENDERNESS IN MOUTH AND THROAT WITH OR WITHOUT PRESENCE OF ULCERS (sore throat, sores in mouth, or a toothache) °UNUSUAL RASH, SWELLING OR PAIN  °UNUSUAL VAGINAL DISCHARGE OR ITCHING  ° °Items with * indicate a potential emergency and should be followed up as soon as possible or go to the Emergency Department if any problems should occur. ° °Please show the CHEMOTHERAPY ALERT CARD or IMMUNOTHERAPY ALERT CARD at check-in to the Emergency Department and triage nurse. ° °Should you have questions after your visit or need to cancel or reschedule your appointment, please contact [facility]  MEDICAL ONCOLOGY  Dept: [phone]  and follow the prompts.  Office hours are 8:00 a.m. to 4:30 p.m. Monday - Friday. Please note that voicemails left after 4:00 p.m. may not be returned until the following business day.  We are closed weekends and major holidays. You have access to a nurse at all times for urgent questions. Please call the main number to the clinic Dept: [phone] and follow the prompts. ° ° °For any non-urgent questions, you may also contact your provider using MyChart. We now offer e-Visits for anyone 18 and older to request care online for non-urgent symptoms. For details visit [email or web address]. °  °Also download the MyChart app! Go to the app store, search "MyChart", open the app, select [facility], and log in with your MyChart username and password. ° °Due to Covid, a mask is required upon entering the hospital/clinic. If you do not have a mask, one will be given to you upon arrival. For doctor visits, patients may have 1 support person aged 18 or older with them. For treatment visits, patients cannot have anyone with them due to current Covid guidelines and our immunocompromised population.  ° °

## 2021-11-29 ENCOUNTER — Other Ambulatory Visit: Payer: Self-pay

## 2021-11-29 ENCOUNTER — Inpatient Hospital Stay: Payer: Medicare Other

## 2021-11-29 VITALS — BP 103/68 | HR 78 | Temp 97.7°F | Resp 18 | Wt 176.5 lb

## 2021-11-29 DIAGNOSIS — Z5112 Encounter for antineoplastic immunotherapy: Secondary | ICD-10-CM | POA: Diagnosis not present

## 2021-11-29 DIAGNOSIS — Z95828 Presence of other vascular implants and grafts: Secondary | ICD-10-CM

## 2021-11-29 DIAGNOSIS — C679 Malignant neoplasm of bladder, unspecified: Secondary | ICD-10-CM

## 2021-11-29 LAB — CMP (CANCER CENTER ONLY)
ALT: 28 U/L (ref 0–44)
AST: 26 U/L (ref 15–41)
Albumin: 3.7 g/dL (ref 3.5–5.0)
Alkaline Phosphatase: 57 U/L (ref 38–126)
Anion gap: 5 (ref 5–15)
BUN: 21 mg/dL (ref 8–23)
CO2: 29 mmol/L (ref 22–32)
Calcium: 8.7 mg/dL — ABNORMAL LOW (ref 8.9–10.3)
Chloride: 102 mmol/L (ref 98–111)
Creatinine: 1.06 mg/dL (ref 0.61–1.24)
GFR, Estimated: >60 mL/min (ref >60)
Glucose, Bld: 245 mg/dL — ABNORMAL HIGH (ref 70–99)
Potassium: 4.3 mmol/L (ref 3.5–5.1)
Sodium: 136 mmol/L (ref 135–145)
Total Bilirubin: 0.6 mg/dL (ref 0.3–1.2)
Total Protein: 6.4 g/dL — ABNORMAL LOW (ref 6.5–8.1)

## 2021-11-29 LAB — CBC WITH DIFFERENTIAL (CANCER CENTER ONLY)
Abs Immature Granulocytes: 0.02 10*3/uL (ref 0.00–0.07)
Basophils Absolute: 0 10*3/uL (ref 0.0–0.1)
Basophils Relative: 0 %
Eosinophils Absolute: 0.1 10*3/uL (ref 0.0–0.5)
Eosinophils Relative: 1 %
HCT: 39.6 % (ref 39.0–52.0)
Hemoglobin: 14 g/dL (ref 13.0–17.0)
Immature Granulocytes: 0 %
Lymphocytes Relative: 20 %
Lymphs Abs: 1.4 10*3/uL (ref 0.7–4.0)
MCH: 29.6 pg (ref 26.0–34.0)
MCHC: 35.4 g/dL (ref 30.0–36.0)
MCV: 83.7 fL (ref 80.0–100.0)
Monocytes Absolute: 0.5 10*3/uL (ref 0.1–1.0)
Monocytes Relative: 7 %
Neutro Abs: 5.1 10*3/uL (ref 1.7–7.7)
Neutrophils Relative %: 72 %
Platelet Count: 217 10*3/uL (ref 150–400)
RBC: 4.73 MIL/uL (ref 4.22–5.81)
RDW: 12.5 % (ref 11.5–15.5)
WBC Count: 7.1 10*3/uL (ref 4.0–10.5)
nRBC: 0 % (ref 0.0–0.2)

## 2021-11-29 MED ORDER — SODIUM CHLORIDE 0.9% FLUSH
10.0000 mL | Freq: Once | INTRAVENOUS | Status: AC
  Administered 2021-11-29: 10 mL

## 2021-11-29 MED ORDER — SODIUM CHLORIDE 0.9 % IV SOLN: Freq: Once | INTRAVENOUS | Status: AC

## 2021-11-29 MED ORDER — HEPARIN SOD (PORK) LOCK FLUSH 100 UNIT/ML IV SOLN
500.0000 [IU] | Freq: Once | INTRAVENOUS | Status: AC | PRN
  Administered 2021-11-29: 500 [IU]

## 2021-11-29 MED ORDER — PROCHLORPERAZINE MALEATE 10 MG PO TABS
ORAL_TABLET | ORAL | Status: AC
  Administered 2021-11-29: 10 mg via ORAL
  Filled 2021-11-29: qty 1

## 2021-11-29 MED ORDER — SODIUM CHLORIDE 0.9 % IV SOLN
0.8000 mg/kg | Freq: Once | INTRAVENOUS | Status: AC
  Administered 2021-11-29: 60 mg via INTRAVENOUS
  Filled 2021-11-29: qty 6

## 2021-11-29 MED ORDER — PROCHLORPERAZINE MALEATE 10 MG PO TABS: 10.0000 mg | ORAL_TABLET | Freq: Once | ORAL | Status: AC

## 2021-11-29 MED ORDER — SODIUM CHLORIDE 0.9% FLUSH
10.0000 mL | INTRAVENOUS | Status: DC | PRN
  Administered 2021-11-29: 10 mL

## 2021-11-29 MED ORDER — SODIUM CHLORIDE 0.9 % IV SOLN
10.0000 mg | Freq: Once | INTRAVENOUS | Status: AC
  Administered 2021-11-29: 10 mg via INTRAVENOUS
  Filled 2021-11-29: qty 10

## 2021-12-04 LAB — GUARDANT 360

## 2021-12-10 ENCOUNTER — Other Ambulatory Visit: Payer: Self-pay | Admitting: Oncology

## 2021-12-10 ENCOUNTER — Telehealth: Payer: Self-pay | Admitting: *Deleted

## 2021-12-10 DIAGNOSIS — C679 Malignant neoplasm of bladder, unspecified: Secondary | ICD-10-CM

## 2021-12-10 MED ORDER — OXYCODONE-ACETAMINOPHEN 10-325 MG PO TABS: 1.0000 | ORAL_TABLET | ORAL | 0 refills | Status: DC | PRN

## 2021-12-10 NOTE — Telephone Encounter (Signed)
Danny Wood states he needs a refill of percocet sent to [facility] in [place]. ?

## 2021-12-13 ENCOUNTER — Telehealth: Payer: Self-pay

## 2021-12-13 ENCOUNTER — Encounter: Payer: Self-pay | Admitting: Oncology

## 2021-12-13 ENCOUNTER — Other Ambulatory Visit (HOSPITAL_COMMUNITY): Payer: Self-pay

## 2021-12-13 ENCOUNTER — Other Ambulatory Visit: Payer: Self-pay

## 2021-12-13 ENCOUNTER — Inpatient Hospital Stay: Payer: Medicare Other

## 2021-12-13 ENCOUNTER — Inpatient Hospital Stay: Payer: Medicare Other | Attending: Oncology

## 2021-12-13 ENCOUNTER — Inpatient Hospital Stay (HOSPITAL_BASED_OUTPATIENT_CLINIC_OR_DEPARTMENT_OTHER): Payer: Medicare Other | Admitting: Oncology

## 2021-12-13 VITALS — BP 145/68 | HR 77 | Temp 98.5°F | Resp 15 | Wt 179.4 lb

## 2021-12-13 DIAGNOSIS — C679 Malignant neoplasm of bladder, unspecified: Secondary | ICD-10-CM

## 2021-12-13 DIAGNOSIS — I890 Lymphedema, not elsewhere classified: Secondary | ICD-10-CM | POA: Insufficient documentation

## 2021-12-13 DIAGNOSIS — Z79899 Other long term (current) drug therapy: Secondary | ICD-10-CM | POA: Insufficient documentation

## 2021-12-13 DIAGNOSIS — G629 Polyneuropathy, unspecified: Secondary | ICD-10-CM | POA: Insufficient documentation

## 2021-12-13 DIAGNOSIS — Z5112 Encounter for antineoplastic immunotherapy: Secondary | ICD-10-CM | POA: Insufficient documentation

## 2021-12-13 DIAGNOSIS — C775 Secondary and unspecified malignant neoplasm of intrapelvic lymph nodes: Secondary | ICD-10-CM | POA: Diagnosis not present

## 2021-12-13 DIAGNOSIS — R102 Pelvic and perineal pain: Secondary | ICD-10-CM | POA: Insufficient documentation

## 2021-12-13 DIAGNOSIS — M79606 Pain in leg, unspecified: Secondary | ICD-10-CM | POA: Diagnosis not present

## 2021-12-13 DIAGNOSIS — Z95828 Presence of other vascular implants and grafts: Secondary | ICD-10-CM

## 2021-12-13 LAB — CMP (CANCER CENTER ONLY)
ALT: 27 U/L (ref 0–44)
AST: 22 U/L (ref 15–41)
Albumin: 3.7 g/dL (ref 3.5–5.0)
Alkaline Phosphatase: 74 U/L (ref 38–126)
Anion gap: 5 (ref 5–15)
BUN: 17 mg/dL (ref 8–23)
CO2: 28 mmol/L (ref 22–32)
Calcium: 8.4 mg/dL — ABNORMAL LOW (ref 8.9–10.3)
Chloride: 103 mmol/L (ref 98–111)
Creatinine: 0.94 mg/dL (ref 0.61–1.24)
GFR, Estimated: >60 mL/min (ref >60)
Glucose, Bld: 264 mg/dL — ABNORMAL HIGH (ref 70–99)
Potassium: 4 mmol/L (ref 3.5–5.1)
Sodium: 136 mmol/L (ref 135–145)
Total Bilirubin: 0.4 mg/dL (ref 0.3–1.2)
Total Protein: 6.4 g/dL — ABNORMAL LOW (ref 6.5–8.1)

## 2021-12-13 LAB — CBC WITH DIFFERENTIAL (CANCER CENTER ONLY)
Abs Immature Granulocytes: 0.03 10*3/uL (ref 0.00–0.07)
Basophils Absolute: 0 10*3/uL (ref 0.0–0.1)
Basophils Relative: 1 %
Eosinophils Absolute: 0.1 10*3/uL (ref 0.0–0.5)
Eosinophils Relative: 1 %
HCT: 39.2 % (ref 39.0–52.0)
Hemoglobin: 14 g/dL (ref 13.0–17.0)
Immature Granulocytes: 1 %
Lymphocytes Relative: 30 %
Lymphs Abs: 1.9 10*3/uL (ref 0.7–4.0)
MCH: 29.7 pg (ref 26.0–34.0)
MCHC: 35.7 g/dL (ref 30.0–36.0)
MCV: 83.1 fL (ref 80.0–100.0)
Monocytes Absolute: 0.5 10*3/uL (ref 0.1–1.0)
Monocytes Relative: 8 %
Neutro Abs: 3.8 10*3/uL (ref 1.7–7.7)
Neutrophils Relative %: 59 %
Platelet Count: 199 10*3/uL (ref 150–400)
RBC: 4.72 MIL/uL (ref 4.22–5.81)
RDW: 12.8 % (ref 11.5–15.5)
WBC Count: 6.3 10*3/uL (ref 4.0–10.5)
nRBC: 0 % (ref 0.0–0.2)

## 2021-12-13 MED ORDER — SODIUM CHLORIDE 0.9 % IV SOLN
10.0000 mg | Freq: Once | INTRAVENOUS | Status: AC
  Administered 2021-12-13: 10 mg via INTRAVENOUS
  Filled 2021-12-13: qty 10

## 2021-12-13 MED ORDER — SODIUM CHLORIDE 0.9 % IV SOLN
0.8000 mg/kg | Freq: Once | INTRAVENOUS | Status: AC
  Administered 2021-12-13: 60 mg via INTRAVENOUS
  Filled 2021-12-13: qty 6

## 2021-12-13 MED ORDER — PROCHLORPERAZINE MALEATE 10 MG PO TABS
10.0000 mg | ORAL_TABLET | Freq: Once | ORAL | Status: AC
  Administered 2021-12-13: 10 mg via ORAL
  Filled 2021-12-13: qty 1

## 2021-12-13 MED ORDER — SODIUM CHLORIDE 0.9 % IV SOLN: Freq: Once | INTRAVENOUS | Status: AC

## 2021-12-13 MED ORDER — SODIUM CHLORIDE 0.9% FLUSH
10.0000 mL | INTRAVENOUS | Status: DC | PRN
  Administered 2021-12-13: 10 mL

## 2021-12-13 MED ORDER — SODIUM CHLORIDE 0.9% FLUSH
10.0000 mL | Freq: Once | INTRAVENOUS | Status: AC
  Administered 2021-12-13: 10 mL

## 2021-12-13 MED ORDER — OLAPARIB 150 MG PO TABS
300.0000 mg | ORAL_TABLET | Freq: Two times a day (BID) | ORAL | 1 refills | Status: DC
Start: 2021-12-13 — End: 2022-01-09
  Filled 2021-12-13: qty 120, 30d supply, fill #0

## 2021-12-13 MED ORDER — HEPARIN SOD (PORK) LOCK FLUSH 100 UNIT/ML IV SOLN
500.0000 [IU] | Freq: Once | INTRAVENOUS | Status: AC | PRN
  Administered 2021-12-13: 500 [IU]

## 2021-12-13 NOTE — Telephone Encounter (Signed)
Oral Oncology Patient Advocate Encounter ?  ?Received notification from [facility] that prior authorization for Lonie Peak is required. ?  ?PA submitted on CoverMyMeds ?Key [id number] ?Status is pending ?  ?Oral Oncology Clinic will continue to follow. ? ?Wynn Maudlin CPHT ?Specialty Pharmacy Patient Advocate ?[facility] ?Phone [phone] ?Fax [phone] ?12/13/2021 9:14 AM ? ? ?

## 2021-12-13 NOTE — Telephone Encounter (Signed)
Appeal has been approved 09/02/21-09/01/22 ? ?Patient's Copay $[id number] ? ?Wynn Maudlin CPHT ?Specialty Pharmacy Patient Advocate ?[place] ?Phone [phone] ?Fax [phone] ?12/13/2021 4:16 PM ? ?

## 2021-12-13 NOTE — Progress Notes (Signed)
Per Dr. Alen Blew, ok to treat with Glu 264 mg/dL today ?

## 2021-12-13 NOTE — Progress Notes (Signed)
Hematology and Oncology Follow Up Visit ? ?Danny Wood ?[id number] ?12/04/1950 71 y.o. ?12/13/2021 8:45 AM ?Kathline Magic, MD  ? ?Principle Diagnosis: 71 year old man with stage IV high-grade urothelial carcinoma with pelvic adenopathy documented in 2019.  He initially presented with localized disease in 2015.. ? ?Prior Therapy:  ?He underwent a cystoscopy and a TURBT on 05/17/2014.  ? ?Neoadjuvant systemic chemotherapy in the form of gemcitabine and cisplatin cycle 1 day 1 is on 06/17/2014. He is S/P two cycles completed in 07/2014.  Therapy tolerated poorly with symptoms of nausea and vomiting and worsening renal function. ? ?He is status post robotic cystoprostatectomy and bilateral lymphadenectomy completed on September 09, 2014.  The final pathology revealed T2N0 without any lymph node involvement. ? ?He developed pelvic adenopathy that was biopsy-proven on Jan 14, 2018 to be metastatic urothelial carcinoma. ? ?Carboplatin and gemcitabine cycle 1 started on 02/17/2018.  He completed 8 cycles of therapy in December 2019. ? ?Pembrolizumab 200 mg every 3 weeks started on April 01, 2019.  He is status post 7 cycles of therapy in December 2020.  Therapy stopped because of of patient preference, excellent clinical response and treatment holiday.  He developed progression of disease in June 2021. ? ? ?Current therapy:  ? ?Padcev started on February 11, 2020.  He has been receiving monthly maintenance therapy up till March 2023.   ? ?He is currently receiving it weekly out of a 4-week cycle started in March 2023.  Is here for the next cycle of therapy. ? ? ?Interim History: [name] presents today for a follow-up visit.  Since last visit, he was restarted on Padcev weekly treatment given his disease progression.  He has tolerated therapy well without any major complications.  He continues to have peripheral neuropathy which is unchanged in his lower extremities.  He denies any nausea, vomiting or  abdominal pain.  He is eating well and mobility is maintained.  His pain is overall manageable with morphine long-acting and Percocet as needed. ? ? ? ? ? ? ? ? ? ? ? ? ?Medications: Updated on review. ?Current Outpatient Medications  ?Medication Sig Dispense Refill  ? diphenoxylate-atropine (LOMOTIL) 2.5-0.025 MG tablet 1 to 2 PO QID prn diarrhea 45 tablet 2  ? furosemide (LASIX) 20 MG tablet Take 2 tablets (40 mg total) by mouth daily. 90 tablet 1  ? Insulin Glargine (BASAGLAR KWIKPEN) 100 UNIT/ML Inject 8 Units into the skin every morning. 9 mL 2  ? insulin lispro (HUMALOG KWIKPEN) 100 UNIT/ML KwikPen Inject 2 Units into the skin daily with supper. 1.8 mL 3  ? morphine (MS CONTIN) 30 MG 12 hr tablet Take 1 tablet (30 mg total) by mouth every 12 (twelve) hours. 60 tablet 0  ? oxyCODONE-acetaminophen (PERCOCET) 10-325 MG tablet Take 1 tablet by mouth every 4 (four) hours as needed for pain. 90 tablet 0  ? prochlorperazine (COMPAZINE) 10 MG tablet TAKE 1 TABLET(10 MG) BY MOUTH EVERY 6 HOURS AS NEEDED FOR NAUSEA OR VOMITING 30 tablet 0  ? sertraline (ZOLOFT) 25 MG tablet Take 1 tablet (25 mg total) by mouth at bedtime. 30 tablet 3  ? ?No current facility-administered medications for this visit.  ? ? ? ?Allergies: No Known Allergies ? ? ? ?Physical Exam: ? ? ? ?Blood pressure (!) 145/68, pulse 77, temperature 98.5 ?F (36.9 ?C), temperature source Temporal, resp. rate 15, weight 179 lb 6.4 oz (81.4 kg), SpO2 100 %. ? ? ? ? ? ? ? ?ECOG: 1 ? ? ?  General appearance: Comfortable appearing without any discomfort ?Head: Normocephalic without any trauma ?Oropharynx: Mucous membranes are moist and pink without any thrush or ulcers. ?Eyes: Pupils are equal and round reactive to light. ?Lymph nodes: No cervical, supraclavicular, inguinal or axillary lymphadenopathy.   ?Heart:regular rate and rhythm.  S1 and S2.  Edema noted to the level of the thigh. ?Lung: Clear without any rhonchi or wheezes.  No dullness to  percussion. ?Abdomin: Soft, nontender, nondistended with good bowel sounds.  No hepatosplenomegaly. ?Musculoskeletal: No joint deformity or effusion.  Full range of motion noted. ?Neurological: No deficits noted on motor, sensory and deep tendon reflex exam. ?Skin: No petechial rash or dryness.  Appeared moist.  ? ? ? ? ? ? ? ? ? ? ? ? ? ? ? ? ?.  ? ?Lab Results: ?Lab Results  ?Component Value Date  ? WBC 6.3 12/13/2021  ? HGB 14.0 12/13/2021  ? HCT 39.2 12/13/2021  ? MCV 83.1 12/13/2021  ? PLT 199 12/13/2021  ? ?  Chemistry   ? ? ? ? ? ?Impression and Plan: ? ?71 year old man with: ? ? ? ?1.  Stage IV high-grade urothelial carcinoma with lymphadenopathy diagnosed in 2019. ? ?His disease status was updated at this time and treatment courses were reviewed.  He is currently on Padcev chemotherapy which she has tolerated reasonably well although he has progressive neuropathy.  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  Using a PARP inhibitor and HER2 targeted therapy were discussed today versus continuing his current regimen of Padcev.  For the time being, I recommended continuing Padcev and will attempt to get approval for off label use for Lynparza.  Complication associated with this medication include nausea, vomiting, myelosuppression as well as other GI toxicities.  He is agreeable with this plan. ? ? ?2.  IV access: Port-A-Cath remains in use without complications. ? ?3.  Pelvic and lower extremity pain: Improved at this time.  He is on long-acting morphine with as needed Percocet.  No adjustment of medication is needed. ? ?4.  Prognosis: Therapy remains palliative though aggressive measures are warranted. ? ? ? ?5.  Lymphedema of the right thigh: He continues to follow with occupational therapy regarding that. ? ?6.  Genetic counseling: Given his ATM mutation we have discussed the benefits and rationale for having genetic counseling for him and his family.  He is agreeable and  will make the appropriate referral. ? ? ?7. Followup: He will continue to receive Padcev weekly and MD follow-up in 4 weeks. ? ? ?30  minutes were spent on this encounter.  The time was dedicated to reviewing laboratory data, disease status update, treatment choices and future plan of care discussion. ? ? ?[name], MD ?4/13/20238:45 AM ?

## 2021-12-13 NOTE — Patient Instructions (Signed)
[facility]  Discharge Instructions: ?Thank you for choosing [facility] to provide your oncology and hematology care.  ? ?If you have a lab appointment with the [facility], please go directly to the [facility] and check in at the registration area. ?  ?Wear comfortable clothing and clothing appropriate for easy access to any Portacath or PICC line.  ? ?We strive to give you quality time with your provider. You may need to reschedule your appointment if you arrive late (15 or more minutes).  Arriving late affects you and other patients whose appointments are after yours.  Also, if you miss three or more appointments without notifying the office, you may be dismissed from the clinic at the provider?s discretion.    ?  ?For prescription refill requests, have your pharmacy contact our office and allow 72 hours for refills to be completed.   ? ?Today you received the following chemotherapy and/or immunotherapy agent: enfortumab    ?  ?To help prevent nausea and vomiting after your treatment, we encourage you to take your nausea medication as directed. ? ?BELOW ARE SYMPTOMS THAT SHOULD BE REPORTED IMMEDIATELY: ?*FEVER GREATER THAN 100.4 F (38 ?C) OR HIGHER ?*CHILLS OR SWEATING ?*NAUSEA AND VOMITING THAT IS NOT CONTROLLED WITH YOUR NAUSEA MEDICATION ?*UNUSUAL SHORTNESS OF BREATH ?*UNUSUAL BRUISING OR BLEEDING ?*URINARY PROBLEMS (pain or burning when urinating, or frequent urination) ?*BOWEL PROBLEMS (unusual diarrhea, constipation, pain near the anus) ?TENDERNESS IN MOUTH AND THROAT WITH OR WITHOUT PRESENCE OF ULCERS (sore throat, sores in mouth, or a toothache) ?UNUSUAL RASH, SWELLING OR PAIN  ?UNUSUAL VAGINAL DISCHARGE OR ITCHING  ? ?Items with * indicate a potential emergency and should be followed up as soon as possible or go to the Emergency Department if any problems should occur. ? ?Please show the CHEMOTHERAPY ALERT CARD or IMMUNOTHERAPY ALERT CARD at check-in to  the Emergency Department and triage nurse. ? ?Should you have questions after your visit or need to cancel or reschedule your appointment, please contact [facility]  Dept: [phone]  and follow the prompts.  Office hours are 8:00 a.m. to 4:30 p.m. Monday - Friday. Please note that voicemails left after 4:00 p.m. may not be returned until the following business day.  We are closed weekends and major holidays. You have access to a nurse at all times for urgent questions. Please call the main number to the clinic Dept: [phone] and follow the prompts. ? ? ?For any non-urgent questions, you may also contact your provider using MyChart. We now offer e-Visits for anyone 71 and older to request care online for non-urgent symptoms. For details visit [email or web address]. ?  ?Also download the MyChart app! Go to the app store, search "MyChart", open the app, select [facility], and log in with your MyChart username and password. ? ?Due to Covid, a mask is required upon entering the hospital/clinic. If you do not have a mask, one will be given to you upon arrival. For doctor visits, patients may have 1 support person aged 71 or older with them. For treatment visits, patients cannot have anyone with them due to current Covid guidelines and our immunocompromised population.  ? ?

## 2021-12-13 NOTE — Telephone Encounter (Signed)
Oral Oncology Pharmacist Encounter ?  ?Prior Authorization for Lonie Peak has been denied.   ?  ?Appeal letter sent to [facility] and [facility] with supporting documentation. Appeal faxed to: [phone] ?   ?Oral Oncology Clinic will continue to follow.  ?  ?Leron Croak, PharmD, BCPS ?Hematology/Oncology Clinical Pharmacist ?[facility] ?[phone] ?12/13/2021 1:36 PM ? ? ? ?

## 2021-12-14 ENCOUNTER — Telehealth: Payer: Self-pay | Admitting: Oncology

## 2021-12-14 ENCOUNTER — Telehealth: Payer: Self-pay

## 2021-12-14 ENCOUNTER — Other Ambulatory Visit: Payer: Self-pay | Admitting: Oncology

## 2021-12-14 MED ORDER — MORPHINE SULFATE ER 30 MG PO TBCR: 30.0000 mg | EXTENDED_RELEASE_TABLET | Freq: Two times a day (BID) | ORAL | 0 refills | Status: DC

## 2021-12-14 NOTE — Telephone Encounter (Signed)
Patient called requesting refill on MS Contin. Last filled 11/15/21. ? ?Routed to provider. ?

## 2021-12-14 NOTE — Telephone Encounter (Signed)
Per 4/13 los called pt and spoke to pt.  Confirmed appointment with pt  ?

## 2021-12-17 ENCOUNTER — Telehealth: Payer: Self-pay | Admitting: Oncology

## 2021-12-17 ENCOUNTER — Inpatient Hospital Stay (HOSPITAL_BASED_OUTPATIENT_CLINIC_OR_DEPARTMENT_OTHER): Payer: Medicare Other | Admitting: Licensed Clinical Social Worker

## 2021-12-17 ENCOUNTER — Inpatient Hospital Stay: Payer: Medicare Other

## 2021-12-17 ENCOUNTER — Other Ambulatory Visit: Payer: Self-pay

## 2021-12-17 ENCOUNTER — Encounter: Payer: Self-pay | Admitting: Licensed Clinical Social Worker

## 2021-12-17 DIAGNOSIS — C679 Malignant neoplasm of bladder, unspecified: Secondary | ICD-10-CM

## 2021-12-17 DIAGNOSIS — Z809 Family history of malignant neoplasm, unspecified: Secondary | ICD-10-CM

## 2021-12-17 DIAGNOSIS — Z5112 Encounter for antineoplastic immunotherapy: Secondary | ICD-10-CM | POA: Diagnosis not present

## 2021-12-17 DIAGNOSIS — Z95828 Presence of other vascular implants and grafts: Secondary | ICD-10-CM

## 2021-12-17 LAB — CMP (CANCER CENTER ONLY)
ALT: 29 U/L (ref 0–44)
AST: 24 U/L (ref 15–41)
Albumin: 3.8 g/dL (ref 3.5–5.0)
Alkaline Phosphatase: 60 U/L (ref 38–126)
Anion gap: 4 — ABNORMAL LOW (ref 5–15)
BUN: 22 mg/dL (ref 8–23)
CO2: 30 mmol/L (ref 22–32)
Calcium: 8.9 mg/dL (ref 8.9–10.3)
Chloride: 102 mmol/L (ref 98–111)
Creatinine: 1.01 mg/dL (ref 0.61–1.24)
GFR, Estimated: >60 mL/min (ref >60)
Glucose, Bld: 160 mg/dL — ABNORMAL HIGH (ref 70–99)
Potassium: 4.5 mmol/L (ref 3.5–5.1)
Sodium: 136 mmol/L (ref 135–145)
Total Bilirubin: 0.5 mg/dL (ref 0.3–1.2)
Total Protein: 6.4 g/dL — ABNORMAL LOW (ref 6.5–8.1)

## 2021-12-17 LAB — CBC WITH DIFFERENTIAL (CANCER CENTER ONLY)
Abs Immature Granulocytes: 0.01 10*3/uL (ref 0.00–0.07)
Basophils Absolute: 0 10*3/uL (ref 0.0–0.1)
Basophils Relative: 1 %
Eosinophils Absolute: 0.1 10*3/uL (ref 0.0–0.5)
Eosinophils Relative: 1 %
HCT: 38.5 % — ABNORMAL LOW (ref 39.0–52.0)
Hemoglobin: 14 g/dL (ref 13.0–17.0)
Immature Granulocytes: 0 %
Lymphocytes Relative: 37 %
Lymphs Abs: 2.2 10*3/uL (ref 0.7–4.0)
MCH: 30.4 pg (ref 26.0–34.0)
MCHC: 36.4 g/dL — ABNORMAL HIGH (ref 30.0–36.0)
MCV: 83.5 fL (ref 80.0–100.0)
Monocytes Absolute: 0.6 10*3/uL (ref 0.1–1.0)
Monocytes Relative: 10 %
Neutro Abs: 3.1 10*3/uL (ref 1.7–7.7)
Neutrophils Relative %: 51 %
Platelet Count: 186 10*3/uL (ref 150–400)
RBC: 4.61 MIL/uL (ref 4.22–5.81)
RDW: 12.9 % (ref 11.5–15.5)
WBC Count: 6 10*3/uL (ref 4.0–10.5)
nRBC: 0 % (ref 0.0–0.2)

## 2021-12-17 LAB — GENETIC SCREENING ORDER

## 2021-12-17 MED ORDER — SODIUM CHLORIDE 0.9% FLUSH
10.0000 mL | Freq: Once | INTRAVENOUS | Status: AC
  Administered 2021-12-17: 10 mL

## 2021-12-17 MED ORDER — HEPARIN SOD (PORK) LOCK FLUSH 100 UNIT/ML IV SOLN
500.0000 [IU] | Freq: Once | INTRAVENOUS | Status: AC
  Administered 2021-12-17: 500 [IU]

## 2021-12-17 NOTE — Telephone Encounter (Signed)
Scheduled per 04/14 los, patient has been called and voicemail was left. 

## 2021-12-17 NOTE — Progress Notes (Signed)
REFERRING PROVIDER: ?Danny Portela, MD ?[address] ?[address],  [place] [address] ? ?PRIMARY PROVIDER:  ?Danny Contras, MD ? ?PRIMARY REASON FOR VISIT:  ?1. Malignant neoplasm of urinary bladder, unspecified site [facility])   ?2. Family history of cancer   ? ? ? ?HISTORY OF PRESENT ILLNESS:   ?Danny Wood, a 71 y.o. male, was seen for a [place] cancer genetics consultation at the request of Dr. 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  Danny Wood presents to clinic today to discuss the possibility of a hereditary predisposition to cancer, genetic testing, and to further clarify his future cancer risks, as well as potential cancer risks for family members.  ? ?In 2015, at the age of 66, Danny Wood was diagnosed with bladder cancer.  He had neoadjuvant chemotherapy, cystoprostatectomy in 2016. He developed pelvic adenopathy that was biopsied and showed metastatic urothelial carcinoma in 2019 which has been treated with chemotherapy. 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  ? ? ?CANCER HISTORY:  ?Oncology History  ?Bladder cancer ([place])  ?06/01/2014 Initial Diagnosis  ? Bladder cancer ([place]) ? ?  ?02/17/2018 - 07/24/2018 Chemotherapy  ? The patient had palonosetron (ALOXI) injection 0.25 mg, 0.25 mg, Intravenous,  Once, 8 of 8 cycles ?Administration: 0.25 mg (02/17/2018), 0.25 mg (03/10/2018), 0.25 mg (03/31/2018), 0.25 mg (04/20/2018), 0.25 mg (05/13/2018), 0.25 mg (06/03/2018), 0.25 mg (06/24/2018), 0.25 mg (07/22/2018) ?pegfilgrastim (NEULASTA) injection 6 mg, 6 mg, Subcutaneous, Once, 1 of 1 cycle ?Administration: 6 mg (07/24/2018) ?pegfilgrastim-cbqv (UDENYCA) injection 6 mg, 6 mg, Subcutaneous, Once, 1 of 1 cycle ?Administration: 6 mg (06/26/2018) ?CARBOplatin (PARAPLATIN) 510 mg in sodium chloride 0.9 % 250 mL chemo infusion, 510 mg (84.5 % of original dose 598.5 mg), Intravenous,  Once, 8 of 8  cycles ?Dose modification:   (original dose 598.5 mg, Cycle 1), 506 mg (original dose 598.5 mg, Cycle 2),   (original dose 598.5 mg, Cycle 3, Reason: Provider Judgment), 550 mg (original dose 598.5 mg, Cycle 5) ?Administration: 510 mg (02/17/2018), 510 mg (03/10/2018), 590 mg (03/31/2018), 550 mg (04/20/2018), 550 mg (05/13/2018), 550 mg (06/03/2018), 550 mg (06/24/2018), 550 mg (07/22/2018) ?gemcitabine (GEMZAR) 2,204 mg in sodium chloride 0.9 % 250 mL chemo infusion, 1,000 mg/m2 = 2,204 mg, Intravenous,  Once, 8 of 8 cycles ?Administration: 2,204 mg (02/17/2018), 2,204 mg (02/24/2018), 2,204 mg (03/10/2018), 2,204 mg (03/31/2018), 2,204 mg (04/20/2018), 2,204 mg (05/13/2018), 2,204 mg (06/03/2018), 2,204 mg (06/24/2018), 2,204 mg (07/22/2018) ? ? for chemotherapy treatment.  ? ?  ?04/01/2019 - 08/06/2019 Chemotherapy  ? The patient had pembrolizumab (KEYTRUDA) 200 mg in sodium chloride 0.9 % 50 mL chemo infusion, 200 mg, Intravenous, Once, 7 of 9 cycles ?Administration: 200 mg (04/01/2019), 200 mg (04/23/2019), 200 mg (05/14/2019), 200 mg (06/04/2019), 200 mg (06/25/2019), 200 mg (07/16/2019), 200 mg (08/06/2019) ? ? for chemotherapy treatment.  ? ?  ?02/11/2020 -  Chemotherapy  ? Patient is on Treatment Plan : UROTHELIAL LOCALLY ADVANCED / METASTATIC Enfortumab q28d  ? ?  ?  ?02/20/2021 Cancer Staging  ? Staging form: Urinary Bladder, AJCC 7th Edition ?- Pathologic: Stage IV (T2, N0, M1) - Signed by Danny Portela, MD on 02/20/2021 ? ?  ? ? ?Past Medical History:  ?Diagnosis Date  ? At risk for sleep apnea   ? STOP-BANG= 4       SENT TO PCP 05-16-2014  ? bladder ca dx'd 05/17/14  ?  met dz 12/2017  ? Bladder disorder   ? Hematuria   ? History of renal cell carcinoma   ? 2003  S/P  PARTIAL RIGHT NEPHRECTOMY  ? Presence of surgical incision   ? lumbar diskectomy 05-11-2014-  bandage present  ? Urgency of urination   ? ? ?Past Surgical History:  ?Procedure Laterality Date  ? CYSTOSCOPY N/A 09/09/2014  ? Procedure: CYSTOSCOPY WITH INDOCYANINE  GREEN DYE INJECTION AND URETHRAL DILATION;  Surgeon: Alexis Frock, MD;  Location: [facility];  Service: Urology;  Laterality: N/A;  ? CYSTOSCOPY WITH BIOPSY N/A 05/17/2014  ? Procedure: CYSTO WITH BLADDER BIOPSY;  Surgeon: Malka So, MD;  Location: [facility];  Service: Urology;  Laterality: N/A;  ? EVALUATION UNDER ANESTHESIA WITH HEMORRHOIDECTOMY  07/07/2012  ? Procedure: EXAM UNDER ANESTHESIA WITH HEMORRHOIDECTOMY;  Surgeon: Joyice Faster. Cornett, MD;  Location: [facility];  Service: General;  Laterality: N/A;  ? IR IMAGING GUIDED PORT INSERTION  02/20/2018  ? LUMBAR MICRODISCECTOMY  05-11-2014  ? L4 -- L5 (partial)  ? PARTIAL NEPHRECTOMY Right 2003  ? ROBOT ASSISTED LAPAROSCOPIC COMPLETE CYSTECT ILEAL CONDUIT N/A 09/09/2014  ? Procedure: ROBOTIC ASSISTED LAPAROSCOPIC COMPLETE CYSTOPROSTATECTOMY,  ILEAL CONDUIT;  Surgeon: Alexis Frock, MD;  Location: [facility];  Service: Urology;  Laterality: N/A;  ? ? ?Social History  ? ?Socioeconomic History  ? Marital status: Married  ?  Spouse name: Not on file  ? Number of children: Not on file  ? Years of education: Not on file  ? Highest education level: Not on file  ?Occupational History  ? Not on file  ?Tobacco Use  ? Smoking status: Former  ?  Packs/day: 0.50  ?  Years: 25.00  ?  Pack years: 12.50  ?  Types: Cigarettes  ?  Quit date: 06/02/2004  ?  Years since quitting: 17.5  ? Smokeless tobacco: Never  ?Substance and Sexual Activity  ? Alcohol use: Yes  ?  Alcohol/week: 2.0 standard drinks  ?  Types: 2 Standard drinks or equivalent per week  ? Drug use: No  ? Sexual activity: Not on file  ?Other Topics Concern  ? Not on file  ?Social History Narrative  ? Not on file  ? ?Social Determinants of Health  ? ?Financial Resource Strain: Not on file  ?Food Insecurity: Not on file  ?Transportation Needs: Not on file  ?Physical Activity: Not on file  ?Stress: Not on file  ?Social Connections: Not on file  ?  ? ?FAMILY HISTORY:  ?We obtained a detailed, 4-generation family  history.  Significant diagnoses are listed below: ?Family History  ?Problem Relation Age of Onset  ? Cancer Father   ?     unk type  ? Colon cancer Maternal Uncle   ? Diabetes Neg Hx   ? ?Danny Wood has limited information about his family history. He has a daughter, 31, and son, 29. He had a maternal half brother who passed at 29, and possibly had cancer. His maternal half sister is living at 68. ? ?Danny Wood is unaware of cancer on his mother's side of the family. ? ?Danny Wood's father had cancer, unknown type, and passed at 48. Patient had 2 paternal aunts, 1 uncle. His uncle had colon cancer, unknown age. Grandparents passed over the age of 1. ? ?Danny Wood is unaware of previous family history of genetic testing for hereditary cancer risks. Patient's maternal ancestors are of [place] descent, and paternal ancestors are of [place] descent. There is no  reported Ashkenazi Jewish ancestry. There is no known consanguinity. ? ? ? ?GENETIC COUNSELING ASSESSMENT: Danny Wood is a 71 y.o. male with molecular testing for his cancer which is somewhat suggestive of a hereditary cancer syndrome and predisposition to cancer. We, therefore, discussed and recommended the following at today's visit.  ? ?DISCUSSION: We discussed that approximately 10% of cancer is hereditary. 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 We discussed this gene in particular, noting that there are other genes we can test as well. Cancers and risks are gene specific. We discussed that testing is beneficial for several reasons including knowing about cancer risks, identifying potential screening and risk-reduction options that may be appropriate, and to understand if other family members could be at risk for cancer and allow them to undergo genetic testing.  ? ?We reviewed the  characteristics, features and inheritance patterns of hereditary cancer syndromes. We also discussed genetic testing, including the appropriate family members to test, the process of testing, insurance coverage and turn-a

## 2021-12-18 ENCOUNTER — Telehealth: Payer: Self-pay

## 2021-12-18 NOTE — Telephone Encounter (Signed)
Oral Oncology Patient Advocate Encounter ? ?Completed an [employer] farm manager for [place] Patient Assistance Program in an effort to reduce the patient's out of pocket expense for Lynparza to $0.   ? ?Doctor portion completed and faxed to [phone].  ? ?[employer] patient assistance phone number for follow up is [phone].  ? ?This encounter will be updated until final determination. ? ?Wynn Maudlin CPHT ?Specialty Pharmacy Patient Advocate ?[place] ?Phone [phone] ?Fax [phone] ?12/18/2021 9:44 AM ? ?

## 2021-12-18 NOTE — Telephone Encounter (Signed)
Patient is approved for Lynparza at no cost from [place] [employer] [place] 12/14/21-09/01/22 ? ?[facility] uses [facility] ? ?Wynn Maudlin CPHT ?Specialty Pharmacy Patient Advocate ?[place] ?Phone [phone] ?Fax [phone] ?12/18/2021 9:46 AM ? ?

## 2021-12-19 NOTE — Telephone Encounter (Signed)
Oral Oncology Pharmacist Encounter ? ?Received new prescription for olaparib [facility]ie [facility]) for the treatment of stage IV high grade urothelial carcinoma, planned duration until disease progression or unacceptable toxicity. ? ?Labs from 12/17/21 assessed, no interventions needed. ? ?Current medication list in Epic reviewed, DDIs with Lonie Peak identified: none ? ?Evaluated chart and no patient barriers to medication adherence noted.  ? ?Patient agreement for treatment documented in MD note on 12/13/2021. ? ?Prescription has been e-scribed to the [facility] for benefits analysis and approval. ? ?Oral Oncology Clinic will continue to follow for insurance authorization, copayment issues, initial counseling and start date. ? ?Drema Halon, PharmD ?Hematology/Oncology Clinical Pharmacist ?[facility] ?[phone] ?12/19/2021 8:30 AM ? ?

## 2021-12-20 ENCOUNTER — Other Ambulatory Visit: Payer: Self-pay

## 2021-12-20 ENCOUNTER — Inpatient Hospital Stay: Payer: Medicare Other

## 2021-12-20 VITALS — BP 126/65 | HR 56 | Temp 99.1°F | Resp 16 | Wt 178.5 lb

## 2021-12-20 DIAGNOSIS — C679 Malignant neoplasm of bladder, unspecified: Secondary | ICD-10-CM

## 2021-12-20 DIAGNOSIS — Z95828 Presence of other vascular implants and grafts: Secondary | ICD-10-CM

## 2021-12-20 DIAGNOSIS — Z5112 Encounter for antineoplastic immunotherapy: Secondary | ICD-10-CM | POA: Diagnosis not present

## 2021-12-20 LAB — CMP (CANCER CENTER ONLY)
ALT: 28 U/L (ref 0–44)
AST: 25 U/L (ref 15–41)
Albumin: 3.7 g/dL (ref 3.5–5.0)
Alkaline Phosphatase: 66 U/L (ref 38–126)
Anion gap: 5 (ref 5–15)
BUN: 21 mg/dL (ref 8–23)
CO2: 29 mmol/L (ref 22–32)
Calcium: 8.6 mg/dL — ABNORMAL LOW (ref 8.9–10.3)
Chloride: 102 mmol/L (ref 98–111)
Creatinine: 0.9 mg/dL (ref 0.61–1.24)
GFR, Estimated: >60 mL/min (ref >60)
Glucose, Bld: 185 mg/dL — ABNORMAL HIGH (ref 70–99)
Potassium: 4.3 mmol/L (ref 3.5–5.1)
Sodium: 136 mmol/L (ref 135–145)
Total Bilirubin: 0.5 mg/dL (ref 0.3–1.2)
Total Protein: 6.4 g/dL — ABNORMAL LOW (ref 6.5–8.1)

## 2021-12-20 LAB — CBC WITH DIFFERENTIAL (CANCER CENTER ONLY)
Abs Immature Granulocytes: 0.02 10*3/uL (ref 0.00–0.07)
Basophils Absolute: 0 10*3/uL (ref 0.0–0.1)
Basophils Relative: 0 %
Eosinophils Absolute: 0.1 10*3/uL (ref 0.0–0.5)
Eosinophils Relative: 1 %
HCT: 38.5 % — ABNORMAL LOW (ref 39.0–52.0)
Hemoglobin: 13.7 g/dL (ref 13.0–17.0)
Immature Granulocytes: 0 %
Lymphocytes Relative: 28 %
Lymphs Abs: 1.7 10*3/uL (ref 0.7–4.0)
MCH: 29.8 pg (ref 26.0–34.0)
MCHC: 35.6 g/dL (ref 30.0–36.0)
MCV: 83.9 fL (ref 80.0–100.0)
Monocytes Absolute: 0.6 10*3/uL (ref 0.1–1.0)
Monocytes Relative: 10 %
Neutro Abs: 3.8 10*3/uL (ref 1.7–7.7)
Neutrophils Relative %: 61 %
Platelet Count: 188 10*3/uL (ref 150–400)
RBC: 4.59 MIL/uL (ref 4.22–5.81)
RDW: 13 % (ref 11.5–15.5)
WBC Count: 6.3 10*3/uL (ref 4.0–10.5)
nRBC: 0 % (ref 0.0–0.2)

## 2021-12-20 MED ORDER — SODIUM CHLORIDE 0.9% FLUSH
10.0000 mL | INTRAVENOUS | Status: DC | PRN
  Administered 2021-12-20: 10 mL

## 2021-12-20 MED ORDER — HEPARIN SOD (PORK) LOCK FLUSH 100 UNIT/ML IV SOLN
500.0000 [IU] | Freq: Once | INTRAVENOUS | Status: AC | PRN
  Administered 2021-12-20: 500 [IU]

## 2021-12-20 MED ORDER — SODIUM CHLORIDE 0.9 % IV SOLN
0.8000 mg/kg | Freq: Once | INTRAVENOUS | Status: AC
  Administered 2021-12-20: 60 mg via INTRAVENOUS
  Filled 2021-12-20: qty 6

## 2021-12-20 MED ORDER — SODIUM CHLORIDE 0.9 % IV SOLN
10.0000 mg | Freq: Once | INTRAVENOUS | Status: AC
  Administered 2021-12-20: 10 mg via INTRAVENOUS
  Filled 2021-12-20: qty 10

## 2021-12-20 MED ORDER — SODIUM CHLORIDE 0.9 % IV SOLN: Freq: Once | INTRAVENOUS | Status: AC

## 2021-12-20 MED ORDER — SODIUM CHLORIDE 0.9% FLUSH
10.0000 mL | Freq: Once | INTRAVENOUS | Status: AC
  Administered 2021-12-20: 10 mL

## 2021-12-20 MED ORDER — PROCHLORPERAZINE MALEATE 10 MG PO TABS
10.0000 mg | ORAL_TABLET | Freq: Once | ORAL | Status: AC
  Administered 2021-12-20: 10 mg via ORAL
  Filled 2021-12-20: qty 1

## 2021-12-20 NOTE — Telephone Encounter (Signed)
Spoke with patient informing him that [facility] would be delivering the [place] to his home in 1-2 business days. ?Patient was informed pharmacist would be reaching out for counseling early next week before starting the medication. ? ?Wynn Maudlin CPHT ?Specialty Pharmacy Patient Advocate ?[place] ?Phone [phone] ?Fax [phone] ?12/20/2021 10:28 AM ? ?

## 2021-12-20 NOTE — Patient Instructions (Signed)
[facility] MEDICAL [facility]COLOGY  Discharge Instructions: Thank you for choosing [facility] to provide your oncology and hematology care.   If you have a lab appointment with the Cancer Center, please go directly to the Cancer Center and check in at the registration area.   Wear comfortable clothing and clothing appropriate for easy access to any Portacath or PICC line.   We strive to give you quality time with your provider. You may need to reschedule your appointment if you arrive late (15 or more minutes).  Arriving late affects you and other patients whose appointments are after yours.  Also, if you miss three or more appointments without notifying the office, you may be dismissed from the clinic at the provider's discretion.      For prescription refill requests, have your pharmacy contact our office and allow 72 hours for refills to be completed.    Today you received the following chemotherapy and/or immunotherapy agents: Padcev   To help prevent nausea and vomiting after your treatment, we encourage you to take your nausea medication as directed.  BELOW ARE SYMPTOMS THAT SHOULD BE REPORTED IMMEDIATELY: *FEVER GREATER THAN 100.4 F (38 C) OR HIGHER *CHILLS OR SWEATING *NAUSEA AND VOMITING THAT IS NOT CONTROLLED WITH YOUR NAUSEA MEDICATION *UNUSUAL SHORTNESS OF BREATH *UNUSUAL BRUISING OR BLEEDING *URINARY PROBLEMS (pain or burning when urinating, or frequent urination) *BOWEL PROBLEMS (unusual diarrhea, constipation, pain near the anus) TENDERNESS IN MOUTH AND THROAT WITH OR WITHOUT PRESENCE OF ULCERS (sore throat, sores in mouth, or a toothache) UNUSUAL RASH, SWELLING OR PAIN  UNUSUAL VAGINAL DISCHARGE OR ITCHING   Items with * indicate a potential emergency and should be followed up as soon as possible or go to the Emergency Department if any problems should occur.  Please show the CHEMOTHERAPY ALERT CARD or IMMUNOTHERAPY ALERT CARD at check-in to the  Emergency Department and triage nurse.  Should you have questions after your visit or need to cancel or reschedule your appointment, please contact [facility]  Dept: [phone]  and follow the prompts.  Office hours are 8:00 a.m. to 4:30 p.m. Monday - Friday. Please note that voicemails left after 4:00 p.m. may not be returned until the following business day.  We are closed weekends and major holidays. You have access to a nurse at all times for urgent questions. Please call the main number to the clinic Dept: [phone] and follow the prompts.   For any non-urgent questions, you may also contact your provider using MyChart. We now offer e-Visits for anyone 18 and older to request care online for non-urgent symptoms. For details visit [email or web address].   Also download the MyChart app! Go to the app store, search "MyChart", open the app, select , and log in with your MyChart username and password.  Due to Covid, a mask is required upon entering the hospital/clinic. If you do not have a mask, one will be given to you upon arrival. For doctor visits, patients may have 1 support person aged 18 or older with them. For treatment visits, patients cannot have anyone with them due to current Covid guidelines and our immunocompromised population.   

## 2021-12-25 NOTE — Telephone Encounter (Signed)
Oral Chemotherapy Pharmacist Encounter ? ?I spoke with patient today for overview of: Danny Wood for the treatment of stage IV high grade urothelial carcinoma, planned duration until disease progression or unacceptable toxicity.  ? ?Counseled patient on administration, dosing, side effects, monitoring, drug-food interactions, safe handling, storage, and disposal. ? ?Patient will take Lynparza '150mg'$  tablets, 2 tablets ('300mg'$ ) by mouth 2 times daily without regard to food. ? ?Patient counseled that administering with food may increase tolerability. ? ?Patient knows to avoid grapefruit or grapefruit juice while on therapy with Lynparza. ? ?Danny Wood start date: 12/26/2021  ? ?Adverse effects include but are not limited to: nausea, vomiting, diarrhea, taste changes, mouth sores, fatigue, constipation, decreased blood counts, and joint pain. ?Patient informed that pneumonitis (including some fatalities) has occurred rarely. ?Myelodysplastic syndrome/acute myeloid leukemia (MDS/AML) have been reported (rarely) in clinical trials. ? ?Patient has anti-emetic on hand and knows to take it if nausea develops.   ?Patient will obtain anti diarrheal and alert the office of 4 or more loose stools above baseline. ? ?Reviewed with patient importance of keeping a medication schedule and plan for any missed doses. No barriers to medication adherence identified. ? ?Medication reconciliation performed and medication/allergy list updated. ? ?Ship broker for Danny Wood has been obtained. ?[facility] from [facility] which uses [facility].  ? ?Patient informed the pharmacy will reach out 5-7 days prior to needing next fill of Lynparza to coordinate continued medication acquisition to prevent break in therapy. ? ?All questions answered. ? ?Danny Wood voiced understanding and appreciation.  ? ?Medication education handout placed in mail for patient. Patient knows to call the office with questions or concerns.  Oral Chemotherapy Clinic phone number provided to patient.  ? ?Drema Halon, PharmD ?Hematology/Oncology Clinical Pharmacist ?[facility] ?[phone] ?12/25/2021   4:23 PM ? ?

## 2021-12-27 ENCOUNTER — Other Ambulatory Visit: Payer: Medicare Other

## 2021-12-27 ENCOUNTER — Ambulatory Visit: Payer: Medicare Other

## 2022-01-02 ENCOUNTER — Other Ambulatory Visit: Payer: Medicare Other

## 2022-01-02 ENCOUNTER — Ambulatory Visit: Payer: Medicare Other

## 2022-01-08 ENCOUNTER — Other Ambulatory Visit: Payer: Self-pay | Admitting: *Deleted

## 2022-01-08 DIAGNOSIS — C679 Malignant neoplasm of bladder, unspecified: Secondary | ICD-10-CM

## 2022-01-08 MED ORDER — OXYCODONE-ACETAMINOPHEN 10-325 MG PO TABS: 1.0000 | ORAL_TABLET | ORAL | 0 refills | Status: DC | PRN

## 2022-01-09 ENCOUNTER — Other Ambulatory Visit: Payer: Self-pay | Admitting: *Deleted

## 2022-01-09 DIAGNOSIS — C679 Malignant neoplasm of bladder, unspecified: Secondary | ICD-10-CM

## 2022-01-09 MED ORDER — OLAPARIB 150 MG PO TABS: 300.0000 mg | ORAL_TABLET | Freq: Two times a day (BID) | ORAL | 1 refills | Status: DC

## 2022-01-09 MED FILL — Dexamethasone Sodium Phosphate Inj 100 MG/10ML: INTRAMUSCULAR | Qty: 1 | Status: AC

## 2022-01-10 ENCOUNTER — Other Ambulatory Visit: Payer: Self-pay

## 2022-01-10 ENCOUNTER — Inpatient Hospital Stay (HOSPITAL_BASED_OUTPATIENT_CLINIC_OR_DEPARTMENT_OTHER): Payer: Medicare Other | Admitting: Oncology

## 2022-01-10 ENCOUNTER — Inpatient Hospital Stay: Payer: Medicare Other | Attending: Oncology

## 2022-01-10 ENCOUNTER — Ambulatory Visit: Payer: Medicare Other

## 2022-01-10 VITALS — BP 130/69 | HR 68 | Temp 97.8°F | Resp 17 | Ht 74.0 in | Wt 177.6 lb

## 2022-01-10 DIAGNOSIS — I890 Lymphedema, not elsewhere classified: Secondary | ICD-10-CM | POA: Diagnosis not present

## 2022-01-10 DIAGNOSIS — R112 Nausea with vomiting, unspecified: Secondary | ICD-10-CM | POA: Insufficient documentation

## 2022-01-10 DIAGNOSIS — R590 Localized enlarged lymph nodes: Secondary | ICD-10-CM | POA: Diagnosis not present

## 2022-01-10 DIAGNOSIS — C679 Malignant neoplasm of bladder, unspecified: Secondary | ICD-10-CM

## 2022-01-10 DIAGNOSIS — Z95828 Presence of other vascular implants and grafts: Secondary | ICD-10-CM

## 2022-01-10 DIAGNOSIS — G8929 Other chronic pain: Secondary | ICD-10-CM | POA: Insufficient documentation

## 2022-01-10 DIAGNOSIS — Z79899 Other long term (current) drug therapy: Secondary | ICD-10-CM | POA: Diagnosis not present

## 2022-01-10 DIAGNOSIS — Z5112 Encounter for antineoplastic immunotherapy: Secondary | ICD-10-CM | POA: Insufficient documentation

## 2022-01-10 DIAGNOSIS — R197 Diarrhea, unspecified: Secondary | ICD-10-CM | POA: Diagnosis not present

## 2022-01-10 DIAGNOSIS — M79606 Pain in leg, unspecified: Secondary | ICD-10-CM | POA: Diagnosis not present

## 2022-01-10 LAB — CMP (CANCER CENTER ONLY)
ALT: 23 U/L (ref 0–44)
AST: 23 U/L (ref 15–41)
Albumin: 3.8 g/dL (ref 3.5–5.0)
Alkaline Phosphatase: 71 U/L (ref 38–126)
Anion gap: 5 (ref 5–15)
BUN: 20 mg/dL (ref 8–23)
CO2: 28 mmol/L (ref 22–32)
Calcium: 8.6 mg/dL — ABNORMAL LOW (ref 8.9–10.3)
Chloride: 103 mmol/L (ref 98–111)
Creatinine: 0.97 mg/dL (ref 0.61–1.24)
GFR, Estimated: >60 mL/min (ref >60)
Glucose, Bld: 226 mg/dL — ABNORMAL HIGH (ref 70–99)
Potassium: 4 mmol/L (ref 3.5–5.1)
Sodium: 136 mmol/L (ref 135–145)
Total Bilirubin: 0.5 mg/dL (ref 0.3–1.2)
Total Protein: 6.5 g/dL (ref 6.5–8.1)

## 2022-01-10 LAB — CBC WITH DIFFERENTIAL (CANCER CENTER ONLY)
Abs Immature Granulocytes: 0.01 10*3/uL (ref 0.00–0.07)
Basophils Absolute: 0 10*3/uL (ref 0.0–0.1)
Basophils Relative: 1 %
Eosinophils Absolute: 0.1 10*3/uL (ref 0.0–0.5)
Eosinophils Relative: 2 %
HCT: 38.1 % — ABNORMAL LOW (ref 39.0–52.0)
Hemoglobin: 13.8 g/dL (ref 13.0–17.0)
Immature Granulocytes: 0 %
Lymphocytes Relative: 33 %
Lymphs Abs: 1.7 10*3/uL (ref 0.7–4.0)
MCH: 30.4 pg (ref 26.0–34.0)
MCHC: 36.2 g/dL — ABNORMAL HIGH (ref 30.0–36.0)
MCV: 83.9 fL (ref 80.0–100.0)
Monocytes Absolute: 0.6 10*3/uL (ref 0.1–1.0)
Monocytes Relative: 11 %
Neutro Abs: 2.9 10*3/uL (ref 1.7–7.7)
Neutrophils Relative %: 53 %
Platelet Count: 221 10*3/uL (ref 150–400)
RBC: 4.54 MIL/uL (ref 4.22–5.81)
RDW: 14 % (ref 11.5–15.5)
WBC Count: 5.3 10*3/uL (ref 4.0–10.5)
nRBC: 0 % (ref 0.0–0.2)

## 2022-01-10 MED ORDER — HEPARIN SOD (PORK) LOCK FLUSH 100 UNIT/ML IV SOLN
500.0000 [IU] | Freq: Once | INTRAVENOUS | Status: AC
  Administered 2022-01-10: 500 [IU]

## 2022-01-10 MED ORDER — SODIUM CHLORIDE 0.9% FLUSH
10.0000 mL | Freq: Once | INTRAVENOUS | Status: AC
  Administered 2022-01-10: 10 mL

## 2022-01-10 NOTE — Progress Notes (Signed)
Hematology and Oncology Follow Up Visit ? ?Danny Wood ?[id number] ?1951-06-30 71 y.o. ?01/10/2022 8:09 AM ?Danny Magic, MD  ? ?Principle Diagnosis: 71 year old man with bladder cancer diagnosed in 2015.  He developed stage IV high-grade urothelial carcinoma with pelvic adenopathy in 2019.   ? ?Prior Therapy:  ?He underwent a cystoscopy and a TURBT on 05/17/2014.  ? ?Neoadjuvant systemic chemotherapy in the form of gemcitabine and cisplatin cycle 1 day 1 is on 06/17/2014. He is S/P two cycles completed in 07/2014.  Therapy tolerated poorly with symptoms of nausea and vomiting and worsening renal function. ? ?He is status post robotic cystoprostatectomy and bilateral lymphadenectomy completed on September 09, 2014.  The final pathology revealed T2N0 without any lymph node involvement. ? ?He developed pelvic adenopathy that was biopsy-proven on Jan 14, 2018 to be metastatic urothelial carcinoma. ? ?Carboplatin and gemcitabine cycle 1 started on 02/17/2018.  He completed 8 cycles of therapy in December 2019. ? ?Pembrolizumab 200 mg every 3 weeks started on April 01, 2019.  He is status post 7 cycles of therapy in December 2020.  Therapy stopped because of of patient preference, excellent clinical response and treatment holiday.  He developed progression of disease in June 2021. ? ?Padcev started on February 11, 2020.  He has been receiving monthly maintenance therapy up till March 2023.  He developed disease progression at that time.  [id number] analysis showed an ATM mutation as well as ERBB2 mutation. ? ? ?Current therapy:  ? ? ?Lynparza 300 mg twice a day started on December 26, 2021. ? ? ? ?Interim History: Danny Wood returns today for repeat evaluation.  Since last visit, he has started [place] as a salvage option.  He has tolerated this treatment reasonably well with a few complaints.  He has reported loose bowel movements in the morning and then slows down subsequently.  He has reported  decrease in his appetite but no nausea or vomiting.  He does have chronic pain that is manageable with the current pain regimen.  He is still ambulating without any major difficulty but does report limited issues with balance and proprioception. ? ? ? ? ? ? ? ? ? ? ? ? ?Medications: Reviewed without changes. ?Current Outpatient Medications  ?Medication Sig Dispense Refill  ? diphenoxylate-atropine (LOMOTIL) 2.5-0.025 MG tablet 1 to 2 PO QID prn diarrhea 45 tablet 2  ? furosemide (LASIX) 20 MG tablet Take 2 tablets (40 mg total) by mouth daily. 90 tablet 1  ? Insulin Glargine (BASAGLAR KWIKPEN) 100 UNIT/ML Inject 8 Units into the skin every morning. 9 mL 2  ? insulin lispro (HUMALOG KWIKPEN) 100 UNIT/ML KwikPen Inject 2 Units into the skin daily with supper. 1.8 mL 3  ? morphine (MS CONTIN) 30 MG 12 hr tablet Take 1 tablet (30 mg total) by mouth every 12 (twelve) hours. 60 tablet 0  ? olaparib (LYNPARZA) 150 MG tablet Take 2 tablets (300 mg total) by mouth 2 (two) times daily. Swallow whole. May take with food to decrease nausea and vomiting. 120 tablet 1  ? oxyCODONE-acetaminophen (PERCOCET) 10-325 MG tablet Take 1 tablet by mouth every 4 (four) hours as needed for pain. 90 tablet 0  ? prochlorperazine (COMPAZINE) 10 MG tablet TAKE 1 TABLET(10 MG) BY MOUTH EVERY 6 HOURS AS NEEDED FOR NAUSEA OR VOMITING 30 tablet 0  ? sertraline (ZOLOFT) 25 MG tablet Take 1 tablet (25 mg total) by mouth at bedtime. 30 tablet 3  ? ?No current facility-administered medications for this  visit.  ? ? ? ?Allergies: No Known Allergies ? ? ? ?Physical Exam: ? ? ? ? ? ?Blood pressure 130/69, pulse 68, temperature 97.8 ?F (36.6 ?C), resp. rate 17, height 6' 2"  (1.88 m), weight 177 lb 9.6 oz (80.6 kg), SpO2 97 %. ? ? ? ? ? ? ?ECOG: 1 ? ? ? ?General appearance: Alert, awake without any distress. ?Head: Atraumatic without abnormalities ?Oropharynx: Without any thrush or ulcers. ?Eyes: No scleral icterus. ?Lymph nodes: No lymphadenopathy noted in  the cervical, supraclavicular, or axillary nodes ?Heart:regular rate and rhythm, without any murmurs or gallops.   ?Lung: Clear to auscultation without any rhonchi, wheezes or dullness to percussion. ?Abdomin: Soft, nontender without any shifting dullness or ascites. ?Musculoskeletal: No clubbing or cyanosis. ?Neurological: No motor or sensory deficits. ?Skin: No rashes or lesions. ? ? ? ? ? ? ? ? ? ? ? ? ? ? ? ? ? ?.  ? ?Lab Results: ?Lab Results  ?Component Value Date  ? WBC 6.3 12/20/2021  ? HGB 13.7 12/20/2021  ? HCT 38.5 (L) 12/20/2021  ? MCV 83.9 12/20/2021  ? PLT 188 12/20/2021  ? ?  Chemistry   ? ? ? ? ? ?Impression and Plan: ? ?71 year old man with: ? ? ? ?1.  Stage IV high-grade urothelial carcinoma with lymphadenopathy diagnosed in 2019. ? ?He is currently on Lynparza salvage option without any major complications.  Risks and benefits of continuing this treatment were reviewed and the plan is to update his staging scans in 3 months.  Different salvage therapy options including trastuzumab deruxtecan can be utilized.  He is agreeable to continue at this time.  We will consider dose reduction if he continues to have GI complaints. ? ? ?2.  IV access: Port-A-Cath continues to be in use without any issues. ? ?3.  Pelvic and lower extremity pain: Continues to be on morphine and oxycodone.  No adjustment in his pain medication needed at this time with pain is manageable. ? ?4.  Prognosis and goals of care: Treatment remains palliative at this time with his disease being incurable. ? ? ? ?5.  Lymphedema of the right thigh: Related to his metastatic disease and lymphadenopathy. ? ?6.  Genetic counseling: He has completed genetic counseling given his somatic mutations. ? ?7.  Diarrhea and GI complaints: I recommended the use of [place].  Antiemetics are available for him. ? ? ?7. Followup: He will return in 6 weeks for repeat follow-up. ? ? ?30  minutes were dedicated to this visit.  The time was spent on reviewing  laboratory data, disease status update and addressing complication related to cancer and cancer therapy. ? ? ?Danny Button, MD ?5/11/20238:09 AM ?

## 2022-01-17 ENCOUNTER — Ambulatory Visit: Payer: Medicare Other

## 2022-01-17 ENCOUNTER — Other Ambulatory Visit: Payer: Medicare Other

## 2022-01-24 ENCOUNTER — Ambulatory Visit: Payer: Medicare Other

## 2022-01-24 ENCOUNTER — Other Ambulatory Visit: Payer: Medicare Other

## 2022-01-30 ENCOUNTER — Telehealth: Payer: Self-pay | Admitting: *Deleted

## 2022-01-30 ENCOUNTER — Other Ambulatory Visit: Payer: Self-pay | Admitting: *Deleted

## 2022-01-30 DIAGNOSIS — C679 Malignant neoplasm of bladder, unspecified: Secondary | ICD-10-CM

## 2022-01-30 MED ORDER — OXYCODONE-ACETAMINOPHEN 10-325 MG PO TABS: 1.0000 | ORAL_TABLET | ORAL | 0 refills | Status: DC | PRN

## 2022-01-30 NOTE — Telephone Encounter (Signed)
-----   Message from Wyatt Portela, MD sent at 01/30/2022 11:28 AM EDT ----- Nothing to report yet. I will discuss scans with him next visit ----- Message ----- From: Rolene Course, RN Sent: 01/30/2022  11:20 AM EDT To: Wyatt Portela, MD  This patient called & is asking if you have received results of his genetic testing & if anything is abnormal would like you to call him.  He also said he is continuing to have abdominal pain & is now having pain & a burning sensation in  his lower back - is asking if he needs a CT or MRI.  Please advise.    Thanks,] Bethena Roys

## 2022-01-30 NOTE — Telephone Encounter (Signed)
Returned PC to patient, informed him of Dr. Hazeline Junker advice as below, he verbalizes understanding.

## 2022-02-06 ENCOUNTER — Telehealth: Payer: Self-pay | Admitting: Licensed Clinical Social Worker

## 2022-02-07 ENCOUNTER — Ambulatory Visit: Payer: Self-pay | Admitting: Licensed Clinical Social Worker

## 2022-02-07 ENCOUNTER — Encounter: Payer: Self-pay | Admitting: Licensed Clinical Social Worker

## 2022-02-07 ENCOUNTER — Encounter: Payer: Self-pay | Admitting: Oncology

## 2022-02-07 DIAGNOSIS — Z1379 Encounter for other screening for genetic and chromosomal anomalies: Secondary | ICD-10-CM | POA: Insufficient documentation

## 2022-02-07 NOTE — Telephone Encounter (Signed)
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 Explained possible explanations for this type of result and next steps. He will let his children know to contact me to coordinate testing.

## 2022-02-07 NOTE — Progress Notes (Signed)
HPI:  Danny Wood was previously seen in the [facility] due to a personal and family history of cancer, his Guardant test results, and concerns regarding a hereditary predisposition to cancer. Please refer to our prior cancer genetics clinic note for more information regarding our discussion, assessment and recommendations, at the time. Danny Wood recent genetic test results were disclosed to him, as were recommendations warranted by these results. These results and recommendations are discussed in more detail below.  CANCER HISTORY:  Oncology History  Bladder cancer ([place])  06/01/2014 Initial Diagnosis   Bladder cancer ([place])   02/17/2018 - 07/24/2018 Chemotherapy   The patient had palonosetron (ALOXI) injection 0.25 mg, 0.25 mg, Intravenous,  Once, 8 of 8 cycles Administration: 0.25 mg (02/17/2018), 0.25 mg (03/10/2018), 0.25 mg (03/31/2018), 0.25 mg (04/20/2018), 0.25 mg (05/13/2018), 0.25 mg (06/03/2018), 0.25 mg (06/24/2018), 0.25 mg (07/22/2018) pegfilgrastim (NEULASTA) injection 6 mg, 6 mg, Subcutaneous, Once, 1 of 1 cycle Administration: 6 mg (07/24/2018) pegfilgrastim-cbqv (UDENYCA) injection 6 mg, 6 mg, Subcutaneous, Once, 1 of 1 cycle Administration: 6 mg (06/26/2018) CARBOplatin (PARAPLATIN) 510 mg in sodium chloride 0.9 % 250 mL chemo infusion, 510 mg (84.5 % of original dose 598.5 mg), Intravenous,  Once, 8 of 8 cycles Dose modification:   (original dose 598.5 mg, Cycle 1), 506 mg (original dose 598.5 mg, Cycle 2),   (original dose 598.5 mg, Cycle 3, Reason: Provider Judgment), 550 mg (original dose 598.5 mg, Cycle 5) Administration: 510 mg (02/17/2018), 510 mg (03/10/2018), 590 mg (03/31/2018), 550 mg (04/20/2018), 550 mg (05/13/2018), 550 mg (06/03/2018), 550 mg (06/24/2018), 550 mg (07/22/2018) gemcitabine (GEMZAR) 2,204 mg in sodium chloride 0.9 % 250 mL chemo infusion, 1,000 mg/m2 = 2,204 mg, Intravenous,  Once, 8 of 8 cycles Administration: 2,204 mg  (02/17/2018), 2,204 mg (02/24/2018), 2,204 mg (03/10/2018), 2,204 mg (03/31/2018), 2,204 mg (04/20/2018), 2,204 mg (05/13/2018), 2,204 mg (06/03/2018), 2,204 mg (06/24/2018), 2,204 mg (07/22/2018)  for chemotherapy treatment.    04/01/2019 - 08/06/2019 Chemotherapy   The patient had pembrolizumab (KEYTRUDA) 200 mg in sodium chloride 0.9 % 50 mL chemo infusion, 200 mg, Intravenous, Once, 7 of 9 cycles Administration: 200 mg (04/01/2019), 200 mg (04/23/2019), 200 mg (05/14/2019), 200 mg (06/04/2019), 200 mg (06/25/2019), 200 mg (07/16/2019), 200 mg (08/06/2019)  for chemotherapy treatment.    02/11/2020 -  Chemotherapy   Patient is on Treatment Plan : UROTHELIAL LOCALLY ADVANCED / METASTATIC Enfortumab q28d     02/20/2021 Cancer Staging   Staging form: Urinary Bladder, AJCC 7th Edition - Pathologic: Stage IV (T2, N0, M1) - Signed by Wyatt Portela, MD on 02/20/2021     FAMILY HISTORY:  We obtained a detailed, 4-generation family history.  Significant diagnoses are listed below: Family History  Problem Relation Age of Onset   Cancer Father        unk type   Colon cancer Maternal Uncle    Diabetes Neg Hx    Danny Wood has limited information about his family history. He has a daughter, 73, and son, 68. He had a maternal half brother who passed at 48, and possibly had cancer. His maternal half sister is living at 7.   Danny Wood is unaware of cancer on his mother's side of the family.   Danny Wood's father had cancer, unknown type, and passed at 7. Patient had 2 paternal aunts, 1 uncle. His uncle had colon cancer, unknown age. Grandparents passed over the age of 80.   Danny Wood is unaware of previous  family history of genetic testing for hereditary cancer risks. Patient's maternal ancestors are of [place] descent, and paternal ancestors are of [place] descent. There is no reported Ashkenazi Jewish ancestry. There is no known consanguinity.     GENETIC TEST RESULTS: Genetic testing  reported out on 01/24/2022 through the [facility] Common Hereditary Cancers+RNA cancer panel found one single, possibly pathogenic variant in TP53. The remainder of testing was negative/normal.   The Common Hereditary Cancers Panel + RNA offered by [facility] includes sequencing and/or deletion duplication testing of the following 47 genes: APC, ATM, AXIN2, BARD1, BMPR1A, BRCA1, BRCA2, BRIP1, CDH1, CDKN2A (p14ARF), CDKN2A (p16INK4a), CKD4, CHEK2, CTNNA1, DICER1, EPCAM (Deletion/duplication testing only), GREM1 (promoter region deletion/duplication testing only), KIT, MEN1, MLH1, MSH2, MSH3, MSH6, MUTYH, NBN, NF1, NHTL1, PALB2, PDGFRA, PMS2, POLD1, POLE, PTEN, RAD50, RAD51C, RAD51D, SDHB, SDHC, SDHD, SMAD4, SMARCA4. STK11, TP53, TSC1, TSC2, and VHL.  The following genes were evaluated for sequence changes only: SDHA and HOXB13 c.251G>A variant only.  The test report has been scanned into EPIC and is located under the Molecular Pathology section of the Results Review tab.  A portion of the result report is included below for reference.      Possibly Mosaic TP53  A possibly mosaic pathogenic or likely pathogenic genetic change (known as a variant) is not a straightforward result. Possibly mosaic means a variant is suspected to be present in some cells and tissues of the body, but not in others.  Additional follow-up testing and clinical assessment should be considered to clarify the meaning of a possible mosaic result for an individual and their family members.  Some mosaic variants may have been present from birth, while others may have been acquired later on.  When a variant is present from birth, it causes a genetic condition in the individual. In this case, the variant is in the blood and at least some other tissues. The degree of symptoms may depend on the extent of tissues involved. There is a chance the variant could be passed on to children.  When a variant is acquired later in life, it is only in  the blood. This is called clonal hematopoiesis (CHIP). The likelihood of clonal hematopoiesis increases with an individual's age or exposure to chemotherapy or radiation treatment. Sometimes acquired variants may be the result of a blood disorder that has already occurred. In these cases, the variant would not cause a genetic condition in the individual and would not be passed on to family members.  To clarify a mosaic result: Clinical assessment Danny Wood's family history is not consistent with traditional Li-Fraumeni syndrome (LFS). LFS is traditionally characterized by an increased risk of a broad spectrum of tumor types, including the following "core" cancers: sarcoma (osteosarcoma, or soft tissue), pre-menopausal breast cancer, brain tumors, leukemia, and adrenocortical carcinoma (ACC). In addition, a variety of other cancers may occur, not excluding ovarian cancer. Men with LFS have an overall 73% chance of developing cancer, and women with LFS have a nearly 100% risk of cancer due to the high incidence of breast cancer. Test family members If variant is found, it is suggestive of a genetic/hereditary condition Danny Wood will share this with his children and they will reach out to me to coordinate testing Test a different sample type It is recommended to test DNA from a different source (not white blood cells), such as fibroblasts which can be obtained from skin punch biopsy Danny Wood would like to try testing his children before proceeding with this option  Follow-up care:  Should Danny Wood's children test positive, this would indicate that Danny Wood should be followed as having Li-Fraumeni.  Individuals with clonal hematopoiesis in certain genes have a somewhat increased risk of developing blood cancer and heart disease. This is known as clonal hematopoiesis of indeterminate potential (CHIP). The most common genes associated with CHIP include (but are not limited  to) DNMT3A, TET2, ASXL1, and TP53. Individuals suspected of having CHIP may consider consultation with a hematologist to discuss management options, such as complete blood count (CBC) screening and reduction of risk factors for heart disease (PMID: 78675449, [id number]).  PLAN:  Danny Wood genetic testing did reveal a mutation and the implications of his result for himself and family members are currently unable to be determined. We are happy to work with Danny Wood  to facilitate any additional testing for his family members that may clarify his family member's risks to develop cancer. He plans to give my number to his children so we can coordinate testing.   Our contact number was provided. Danny Wood's questions were answered to his satisfaction, and he knows he is welcome to call us at anytime with additional questions or concerns.   Faith Rogue, MS, [facility] Genetic Counselor [email or web address] Phone: [phone]

## 2022-02-21 ENCOUNTER — Inpatient Hospital Stay: Payer: Medicare Other | Attending: Oncology

## 2022-02-21 ENCOUNTER — Inpatient Hospital Stay (HOSPITAL_BASED_OUTPATIENT_CLINIC_OR_DEPARTMENT_OTHER): Payer: Medicare Other | Admitting: Oncology

## 2022-02-21 ENCOUNTER — Other Ambulatory Visit: Payer: Self-pay

## 2022-02-21 VITALS — BP 107/71 | HR 74 | Temp 97.3°F | Resp 17 | Wt 175.2 lb

## 2022-02-21 DIAGNOSIS — R634 Abnormal weight loss: Secondary | ICD-10-CM | POA: Diagnosis not present

## 2022-02-21 DIAGNOSIS — R590 Localized enlarged lymph nodes: Secondary | ICD-10-CM | POA: Insufficient documentation

## 2022-02-21 DIAGNOSIS — C679 Malignant neoplasm of bladder, unspecified: Secondary | ICD-10-CM

## 2022-02-21 DIAGNOSIS — R112 Nausea with vomiting, unspecified: Secondary | ICD-10-CM | POA: Diagnosis not present

## 2022-02-21 DIAGNOSIS — Z95828 Presence of other vascular implants and grafts: Secondary | ICD-10-CM

## 2022-02-21 DIAGNOSIS — Z79899 Other long term (current) drug therapy: Secondary | ICD-10-CM | POA: Insufficient documentation

## 2022-02-21 DIAGNOSIS — I890 Lymphedema, not elsewhere classified: Secondary | ICD-10-CM | POA: Diagnosis not present

## 2022-02-21 DIAGNOSIS — G893 Neoplasm related pain (acute) (chronic): Secondary | ICD-10-CM | POA: Insufficient documentation

## 2022-02-21 DIAGNOSIS — M79606 Pain in leg, unspecified: Secondary | ICD-10-CM | POA: Insufficient documentation

## 2022-02-21 DIAGNOSIS — R5383 Other fatigue: Secondary | ICD-10-CM | POA: Insufficient documentation

## 2022-02-21 LAB — CMP (CANCER CENTER ONLY)
ALT: 29 U/L (ref 0–44)
AST: 27 U/L (ref 15–41)
Albumin: 3.5 g/dL (ref 3.5–5.0)
Alkaline Phosphatase: 61 U/L (ref 38–126)
Anion gap: 6 (ref 5–15)
BUN: 21 mg/dL (ref 8–23)
CO2: 26 mmol/L (ref 22–32)
Calcium: 8.3 mg/dL — ABNORMAL LOW (ref 8.9–10.3)
Chloride: 106 mmol/L (ref 98–111)
Creatinine: 0.92 mg/dL (ref 0.61–1.24)
GFR, Estimated: >60 mL/min (ref >60)
Glucose, Bld: 161 mg/dL — ABNORMAL HIGH (ref 70–99)
Potassium: 4.1 mmol/L (ref 3.5–5.1)
Sodium: 138 mmol/L (ref 135–145)
Total Bilirubin: 0.6 mg/dL (ref 0.3–1.2)
Total Protein: 6.4 g/dL — ABNORMAL LOW (ref 6.5–8.1)

## 2022-02-21 LAB — CBC WITH DIFFERENTIAL (CANCER CENTER ONLY)
Abs Immature Granulocytes: 0.01 10*3/uL (ref 0.00–0.07)
Basophils Absolute: 0 10*3/uL (ref 0.0–0.1)
Basophils Relative: 0 %
Eosinophils Absolute: 0.1 10*3/uL (ref 0.0–0.5)
Eosinophils Relative: 1 %
HCT: 38.7 % — ABNORMAL LOW (ref 39.0–52.0)
Hemoglobin: 13.9 g/dL (ref 13.0–17.0)
Immature Granulocytes: 0 %
Lymphocytes Relative: 26 %
Lymphs Abs: 1.5 10*3/uL (ref 0.7–4.0)
MCH: 31.6 pg (ref 26.0–34.0)
MCHC: 35.9 g/dL (ref 30.0–36.0)
MCV: 88 fL (ref 80.0–100.0)
Monocytes Absolute: 0.5 10*3/uL (ref 0.1–1.0)
Monocytes Relative: 9 %
Neutro Abs: 3.7 10*3/uL (ref 1.7–7.7)
Neutrophils Relative %: 64 %
Platelet Count: 193 10*3/uL (ref 150–400)
RBC: 4.4 MIL/uL (ref 4.22–5.81)
RDW: 14.1 % (ref 11.5–15.5)
WBC Count: 5.9 10*3/uL (ref 4.0–10.5)
nRBC: 0 % (ref 0.0–0.2)

## 2022-02-21 MED ORDER — HEPARIN SOD (PORK) LOCK FLUSH 100 UNIT/ML IV SOLN
500.0000 [IU] | Freq: Once | INTRAVENOUS | Status: AC
  Administered 2022-02-21: 500 [IU] via INTRAVENOUS

## 2022-02-21 MED ORDER — SODIUM CHLORIDE 0.9% FLUSH
10.0000 mL | INTRAVENOUS | Status: DC | PRN
  Administered 2022-02-21: 10 mL via INTRAVENOUS

## 2022-02-21 NOTE — Progress Notes (Signed)
Hematology and Oncology Follow Up Visit  Danny Wood [id number] [birth year] 71 y.o. 02/21/2022 3:18 PM Danny Wood, Danny Hua, MD   Principle Diagnosis: 71 year old man with stage IV high-grade urothelial carcinoma with pelvic adenopathy diagnosed in 2019.   He presented with localized disease in 2015.  Prior Therapy:  He underwent a cystoscopy and a TURBT on 05/17/2014.   Neoadjuvant systemic chemotherapy in the form of gemcitabine and cisplatin cycle 1 day 1 is on 06/17/2014. He is S/P two cycles completed in 07/2014.  Therapy tolerated poorly with symptoms of nausea and vomiting and worsening renal function.  He is status post robotic cystoprostatectomy and bilateral lymphadenectomy completed on September 09, 2014.  The final pathology revealed T2N0 without any lymph node involvement.  He developed pelvic adenopathy that was biopsy-proven on Jan 14, 2018 to be metastatic urothelial carcinoma.  Carboplatin and gemcitabine cycle 1 started on 02/17/2018.  He completed 8 cycles of therapy in December 2019.  Pembrolizumab 200 mg every 3 weeks started on April 01, 2019.  He is status post 7 cycles of therapy in December 2020.  Therapy stopped because of of patient preference, excellent clinical response and treatment holiday.  He developed progression of disease in June 2021.  Padcev started on February 11, 2020.  He has been receiving monthly maintenance therapy up till March 2023.  He developed disease progression at that time.  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   Current therapy:    Lynparza 300 mg twice a day started on December 26, 2021.    Interim History: Danny Wood is here for follow-up visit.  Since last visit, he reports no major changes in his health.  He has reported increased GI toxicity including nausea, fatigue and weight loss.  He does report chronic pain which is overall manageable with morphine.  He denies any worsening  diarrhea or bone pain.             Medications: Reviewed without changes. Current Outpatient Medications  Medication Sig Dispense Refill   diphenoxylate-atropine (LOMOTIL) 2.5-0.025 MG tablet 1 to 2 PO QID prn diarrhea 45 tablet 2   furosemide (LASIX) 20 MG tablet Take 2 tablets (40 mg total) by mouth daily. 90 tablet 1   Insulin Glargine (BASAGLAR KWIKPEN) 100 UNIT/ML Inject 8 Units into the skin every morning. 9 mL 2   insulin lispro (HUMALOG KWIKPEN) 100 UNIT/ML KwikPen Inject 2 Units into the skin daily with supper. 1.8 mL 3   morphine (MS CONTIN) 30 MG 12 hr tablet Take 1 tablet (30 mg total) by mouth every 12 (twelve) hours. 60 tablet 0   olaparib (LYNPARZA) 150 MG tablet Take 2 tablets (300 mg total) by mouth 2 (two) times daily. Swallow whole. May take with food to decrease nausea and vomiting. 120 tablet 1   oxyCODONE-acetaminophen (PERCOCET) 10-325 MG tablet Take 1 tablet by mouth every 4 (four) hours as needed for pain. 90 tablet 0   prochlorperazine (COMPAZINE) 10 MG tablet TAKE 1 TABLET(10 MG) BY MOUTH EVERY 6 HOURS AS NEEDED FOR NAUSEA OR VOMITING 30 tablet 0   sertraline (ZOLOFT) 25 MG tablet Take 1 tablet (25 mg total) by mouth at bedtime. 30 tablet 3   No current facility-administered medications for this visit.     Allergies: No Known Allergies    Physical Exam:      Blood pressure 107/71, pulse 74, temperature (!) 97.3 F (36.3 C), temperature source Tympanic, resp. rate 17, weight 175  lb 3 oz (79.5 kg), SpO2 96 %.        ECOG: 1   General appearance: Comfortable appearing without any discomfort Head: Normocephalic without any trauma Oropharynx: Mucous membranes are moist and pink without any thrush or ulcers. Eyes: Pupils are equal and round reactive to light. Lymph nodes: No cervical, supraclavicular, inguinal or axillary lymphadenopathy.   Heart:regular rate and rhythm.  S1 and S2 without leg edema. Lung: Clear without any rhonchi or  wheezes.  No dullness to percussion. Abdomin: Soft, nontender, nondistended with good bowel sounds.  No hepatosplenomegaly. Musculoskeletal: No joint deformity or effusion.  Full range of motion noted. Neurological: No deficits noted on motor, sensory and deep tendon reflex exam. Skin: No petechial rash or dryness.  Appeared moist.                    .   Lab Results: Lab Results  Component Value Date   WBC 5.3 01/10/2022   HGB 13.8 01/10/2022   HCT 38.1 (L) 01/10/2022   MCV 83.9 01/10/2022   PLT 221 01/10/2022     Chemistry        Impression and Plan:  71 year old man with:    1.  Bladder cancer diagnosed in 2015.  He developed stage IV high-grade urothelial carcinoma with lymphadenopathy in 2019.  His disease status was updated at this time and treatment choices were reviewed.  He is experiencing complications related to Danny Wood I recommended reducing the dose to 250 mg twice a day.  I we will also update his CT scan in the near future and decide on the duration of therapy.    Different salvage therapy options including trastuzumab deruxtecan can be used if he has progression of disease.  Continuing on Danny Wood could be considered if he has response radiographically.  He is not benefiting clinically at this time.   2.  IV access: Port-A-Cath remains in place without any complications.  3.  Pelvic and lower extremity pain: He is currently on morphine and oxycodone.  His pain is related to malignancy.  4.  Prognosis and goals of care: Therapy remains palliative although aggressive measures are warranted given his reasonable performance status.    5.  Lymphedema of the right thigh: Related to his malignancy and lymphadenopathy.  He has been evaluated by Occupational Therapy.    6.  GI considerations: Related to Lynparza including nausea and weight loss.  I recommended dose reduction at this time.   7. Followup: We will make the next 2 weeks after  repeat imaging studies to determine best course of action.   30  minutes were spent on this encounter.  The time was dedicated to reviewing laboratory data, disease status update and outlining future plan of care discussion.   Danny Hose, MD 6/22/20233:18 PM

## 2022-02-25 ENCOUNTER — Other Ambulatory Visit: Payer: Self-pay | Admitting: Oncology

## 2022-02-25 ENCOUNTER — Telehealth: Payer: Self-pay | Admitting: *Deleted

## 2022-02-25 ENCOUNTER — Telehealth: Payer: Self-pay | Admitting: Oncology

## 2022-02-25 DIAGNOSIS — C679 Malignant neoplasm of bladder, unspecified: Secondary | ICD-10-CM

## 2022-02-25 MED ORDER — OXYCODONE-ACETAMINOPHEN 10-325 MG PO TABS: 1.0000 | ORAL_TABLET | ORAL | 0 refills | Status: DC | PRN

## 2022-02-25 MED ORDER — MORPHINE SULFATE ER 30 MG PO TBCR
30.0000 mg | EXTENDED_RELEASE_TABLET | Freq: Two times a day (BID) | ORAL | 0 refills | Status: DC
Start: 2022-02-25 — End: 2022-04-04

## 2022-02-25 NOTE — Telephone Encounter (Signed)
Mr Trudo called for refills of Morphine and Percocet to [facility] in [place].

## 2022-03-01 ENCOUNTER — Ambulatory Visit (HOSPITAL_COMMUNITY)
Admission: RE | Admit: 2022-03-01 | Discharge: 2022-03-01 | Disposition: A | Discharge status: Home / Self Care | Payer: Medicare Other | Source: Ambulatory Visit | Attending: Oncology | Admitting: Oncology

## 2022-03-01 DIAGNOSIS — C679 Malignant neoplasm of bladder, unspecified: Secondary | ICD-10-CM | POA: Insufficient documentation

## 2022-03-01 MED ORDER — IOHEXOL 300 MG/ML  SOLN
100.0000 mL | Freq: Once | INTRAMUSCULAR | Status: AC | PRN
  Administered 2022-03-01: 100 mL via INTRAVENOUS

## 2022-03-01 MED ORDER — SODIUM CHLORIDE (PF) 0.9 % IJ SOLN
INTRAMUSCULAR | Status: AC
  Filled 2022-03-01: qty 50

## 2022-03-01 MED ORDER — HEPARIN SOD (PORK) LOCK FLUSH 100 UNIT/ML IV SOLN
INTRAVENOUS | Status: AC
  Filled 2022-03-01: qty 5

## 2022-03-01 MED ORDER — SODIUM CHLORIDE 0.9 % IV SOLN
INTRAVENOUS | Status: AC
  Filled 2022-03-01: qty 250

## 2022-03-01 MED ORDER — HEPARIN SOD (PORK) LOCK FLUSH 100 UNIT/ML IV SOLN
500.0000 [IU] | Freq: Once | INTRAVENOUS | Status: AC
  Administered 2022-03-01: 500 [IU] via INTRAVENOUS

## 2022-03-04 ENCOUNTER — Telehealth: Payer: Self-pay

## 2022-03-04 ENCOUNTER — Inpatient Hospital Stay: Payer: Medicare Other | Attending: Oncology | Admitting: Oncology

## 2022-03-04 ENCOUNTER — Other Ambulatory Visit: Payer: Self-pay | Admitting: Oncology

## 2022-03-04 DIAGNOSIS — C679 Malignant neoplasm of bladder, unspecified: Secondary | ICD-10-CM | POA: Diagnosis not present

## 2022-03-04 DIAGNOSIS — R112 Nausea with vomiting, unspecified: Secondary | ICD-10-CM | POA: Insufficient documentation

## 2022-03-04 DIAGNOSIS — Z79899 Other long term (current) drug therapy: Secondary | ICD-10-CM | POA: Insufficient documentation

## 2022-03-04 DIAGNOSIS — Z5112 Encounter for antineoplastic immunotherapy: Secondary | ICD-10-CM | POA: Insufficient documentation

## 2022-03-04 NOTE — Telephone Encounter (Signed)
Called pt tp let him know that the CT scan results have not been reported. Dr. Alen Blew will call him when the results come in. Pt complaining of abdominal pain that feels like someone is punching him in the stomach. Would like to discuss possibility of liquid pain medicine.

## 2022-03-04 NOTE — Progress Notes (Signed)
DISCONTINUE ON PATHWAY REGIMEN - Bladder     A cycle is every 28 days:     Enfortumab vedotin-ejfv   **Always confirm dose/schedule in your pharmacy ordering system**  REASON: Disease Progression PRIOR TREATMENT: [id number]: Enfortumab vedotin 1.25 mg/kg D1, 8, 15 q28 Days Until Progression or Unacceptable Toxicity TREATMENT RESPONSE: Partial Response (PR)  START ON PATHWAY REGIMEN - Bladder     A cycle is every 21 days:     Sacituzumab govitecan-hziy   **Always confirm dose/schedule in your pharmacy ordering system**  Patient Characteristics: Advanced/Metastatic Disease, Third Line and Beyond Therapeutic Status: Advanced/Metastatic Disease Line of Therapy: Third Line and Beyond  Intent of Therapy: Non-Curative / Palliative Intent, Discussed with Patient

## 2022-03-04 NOTE — Progress Notes (Signed)
Hematology and Oncology Follow Up for Telemedicine Visits  Danny Wood [id number] 04/20/1951 70 y.o. 03/04/2022 10:12 AM Danny Wood, MDSwayne, David, MD   I connected with Danny Wood on 03/04/22 at 10:00 AM EDT by telephone visit and verified that I am speaking with the correct person using two identifiers.   I discussed the limitations, risks, security and privacy concerns of performing an evaluation and management service by telemedicine and the availability of in-person appointments. I also discussed with the patient that there may be a patient responsible charge related to this service. The patient expressed understanding and agreed to proceed.  Other persons participating in the visit and their role in the encounter:  None  Patient's location: Home Provider's location: Office   Principle Diagnosis:  70-year-old man with bladder cancer diagnosed in 2015.  He developed stage IV high-grade urothelial carcinoma with pelvic adenopathy diagnosed in 2019.      Prior Therapy:  He underwent a cystoscopy and a TURBT on 05/17/2014.    Neoadjuvant systemic chemotherapy in the form of gemcitabine and cisplatin cycle 1 day 1 is on 06/17/2014. He is S/P two cycles completed in 07/2014.  Therapy tolerated poorly with symptoms of nausea and vomiting and worsening renal function.   He is status post robotic cystoprostatectomy and bilateral lymphadenectomy completed on September 09, 2014.  The final pathology revealed T2N0 without any lymph node involvement.   He developed pelvic adenopathy that was biopsy-proven on Jan 14, 2018 to be metastatic urothelial carcinoma.   Carboplatin and gemcitabine cycle 1 started on 02/17/2018.  He completed 8 cycles of therapy in December 2019.   Pembrolizumab 200 mg every 3 weeks started on April 01, 2019.  He is status post 7 cycles of therapy in December 2020.  Therapy stopped because of of patient preference, excellent clinical response and treatment  holiday.  He developed progression of disease in June 2021.   Padcev started on February 11, 2020.  He has been receiving monthly maintenance therapy up till March 2023.  He developed disease progression at that time.  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     Current therapy:      Lynparza 300 mg twice a day started on December 26, 2021.       Interim History: Danny Wood reports abdominal pain.  His pain is predominantly in the periumbilical and not radiating at this time.  Is not associated with any nausea vomiting and currently pain medication is effective in controlling it.  His appetite and performance status remains unchanged.   Medications: I have reviewed the patient's current medications.  Current Outpatient Medications  Medication Sig Dispense Refill   diphenoxylate-atropine (LOMOTIL) 2.5-0.025 MG tablet 1 to 2 PO QID prn diarrhea 45 tablet 2   furosemide (LASIX) 20 MG tablet Take 2 tablets (40 mg total) by mouth daily. 90 tablet 1   Insulin Glargine (BASAGLAR KWIKPEN) 100 UNIT/ML Inject 8 Units into the skin every morning. 9 mL 2   insulin lispro (HUMALOG KWIKPEN) 100 UNIT/ML KwikPen Inject 2 Units into the skin daily with supper. 1.8 mL 3   morphine (MS CONTIN) 30 MG 12 hr tablet Take 1 tablet (30 mg total) by mouth every 12 (twelve) hours. 60 tablet 0   olaparib (LYNPARZA) 150 MG tablet Take 2 tablets (300 mg total) by mouth 2 (two) times daily. Swallow whole. May take with food to decrease nausea and vomiting. 120 tablet 1   oxyCODONE-acetaminophen (PERCOCET) 10-325 MG   tablet Take 1 tablet by mouth every 4 (four) hours as needed for pain. 90 tablet 0   prochlorperazine (COMPAZINE) 10 MG tablet TAKE 1 TABLET(10 MG) BY MOUTH EVERY 6 HOURS AS NEEDED FOR NAUSEA OR VOMITING 30 tablet 0   sertraline (ZOLOFT) 25 MG tablet Take 1 tablet (25 mg total) by mouth at bedtime. 30 tablet 3   No current facility-administered medications for this visit.         Lab Results: Lab Results  Component Value Date   WBC 5.9 02/21/2022   HGB 13.9 02/21/2022   HCT 38.7 (L) 02/21/2022   MCV 88.0 02/21/2022   PLT 193 02/21/2022     Chemistry      Component Value Date/Time   NA 138 02/21/2022 1518   NA 139 08/05/2014 1335   K 4.1 02/21/2022 1518   K 4.3 08/05/2014 1335   CL 106 02/21/2022 1518   CO2 26 02/21/2022 1518   CO2 26 08/05/2014 1335   BUN 21 02/21/2022 1518   BUN 18.2 08/05/2014 1335   CREATININE 0.92 02/21/2022 1518   CREATININE 1.1 08/05/2014 1335      Component Value Date/Time   CALCIUM 8.3 (L) 02/21/2022 1518   CALCIUM 9.5 08/05/2014 1335   ALKPHOS 61 02/21/2022 1518   ALKPHOS 98 08/05/2014 1335   AST 27 02/21/2022 1518   AST 45 (H) 08/05/2014 1335   ALT 29 02/21/2022 1518   ALT 73 (H) 08/05/2014 1335   BILITOT 0.6 02/21/2022 1518   BILITOT 0.47 08/05/2014 1335         Impression and Plan:  71 year old man with:       1.  Stage IV high-grade urothelial carcinoma of the bladder with lymphadenopathy diagnosed in 2019.   The natural course of his disease was reviewed and treatment choices were discussed at this time.  He has progressed on multiple therapies in the past and currently on [place] based on Nex[employer] ration sequencing completed in March 2023 with ATM mutation.  Salvage therapy options including HER2 based therapy versus antibody drug conjugate sacituzumab govitecan.  CT scan obtained on March 04, 2022 was personally reviewed and discussed with the patient and showed progression of his disease with enlarging pelvic adenopathy.  Based on these findings I have recommended proceeding with sacituzumab govitecan.  Complications include nausea, vomiting, neuropathy, myelosuppression as well as hypophosphatemia and electrolyte imbalance were reviewed.  He is agreeable to proceed and we will discontinue [place]    2. Pain: He is currently on morphine and breakthrough oxycodone.  We have discussed  strategies to improve his pain medication adherence.  He continues oxycodone 1 to 2 tablets if needed for severe pain.  3.  Follow-up: We will be in the near future for the start of chemotherapy.   I discussed the assessment and treatment plan with the patient. The patient was provided an opportunity to ask questions and all were answered. The patient agreed with the plan and demonstrated an understanding of the instructions.   The patient was advised to call back or seek an in-person evaluation if the symptoms worsen or if the condition fails to improve as anticipated.  I provided 20 minutes of non face-to-face telephone visit time during this encounter.  The time was spent on reviewing imaging studies, treatment choices and addressing complications related to cancer and cancer therapy.  [name], MD 03/04/2022 10:12 AM

## 2022-03-06 ENCOUNTER — Telehealth: Payer: Self-pay | Admitting: Oncology

## 2022-03-06 NOTE — Telephone Encounter (Signed)
Per 06/30 los, patient has been called and notified of upcoming appointments.

## 2022-03-11 MED FILL — Fosaprepitant Dimeglumine For IV Infusion 150 MG (Base Eq): INTRAVENOUS | Qty: 5 | Status: AC

## 2022-03-11 MED FILL — Dexamethasone Sodium Phosphate Inj 100 MG/10ML: INTRAMUSCULAR | Qty: 1 | Status: AC

## 2022-03-12 ENCOUNTER — Inpatient Hospital Stay: Payer: Medicare Other

## 2022-03-12 ENCOUNTER — Other Ambulatory Visit: Payer: Self-pay

## 2022-03-12 ENCOUNTER — Other Ambulatory Visit: Payer: Self-pay | Admitting: Oncology

## 2022-03-12 ENCOUNTER — Telehealth: Payer: Self-pay | Admitting: *Deleted

## 2022-03-12 VITALS — BP 108/62 | HR 56 | Temp 97.7°F | Resp 16 | Wt 170.5 lb

## 2022-03-12 DIAGNOSIS — R112 Nausea with vomiting, unspecified: Secondary | ICD-10-CM | POA: Diagnosis not present

## 2022-03-12 DIAGNOSIS — C679 Malignant neoplasm of bladder, unspecified: Secondary | ICD-10-CM

## 2022-03-12 DIAGNOSIS — Z95828 Presence of other vascular implants and grafts: Secondary | ICD-10-CM

## 2022-03-12 DIAGNOSIS — Z5112 Encounter for antineoplastic immunotherapy: Secondary | ICD-10-CM | POA: Diagnosis not present

## 2022-03-12 DIAGNOSIS — Z79899 Other long term (current) drug therapy: Secondary | ICD-10-CM | POA: Diagnosis not present

## 2022-03-12 LAB — CMP (CANCER CENTER ONLY)
ALT: 23 U/L (ref 0–44)
AST: 24 U/L (ref 15–41)
Albumin: 3.7 g/dL (ref 3.5–5.0)
Alkaline Phosphatase: 49 U/L (ref 38–126)
Anion gap: 4 — ABNORMAL LOW (ref 5–15)
BUN: 22 mg/dL (ref 8–23)
CO2: 28 mmol/L (ref 22–32)
Calcium: 8.8 mg/dL — ABNORMAL LOW (ref 8.9–10.3)
Chloride: 103 mmol/L (ref 98–111)
Creatinine: 1.01 mg/dL (ref 0.61–1.24)
GFR, Estimated: >60 mL/min (ref >60)
Glucose, Bld: 174 mg/dL — ABNORMAL HIGH (ref 70–99)
Potassium: 4.3 mmol/L (ref 3.5–5.1)
Sodium: 135 mmol/L (ref 135–145)
Total Bilirubin: 0.4 mg/dL (ref 0.3–1.2)
Total Protein: 6.2 g/dL — ABNORMAL LOW (ref 6.5–8.1)

## 2022-03-12 LAB — CBC WITH DIFFERENTIAL (CANCER CENTER ONLY)
Abs Immature Granulocytes: 0.02 10*3/uL (ref 0.00–0.07)
Basophils Absolute: 0 10*3/uL (ref 0.0–0.1)
Basophils Relative: 0 %
Eosinophils Absolute: 0.1 10*3/uL (ref 0.0–0.5)
Eosinophils Relative: 2 %
HCT: 37.3 % — ABNORMAL LOW (ref 39.0–52.0)
Hemoglobin: 13.6 g/dL (ref 13.0–17.0)
Immature Granulocytes: 0 %
Lymphocytes Relative: 31 %
Lymphs Abs: 1.8 10*3/uL (ref 0.7–4.0)
MCH: 31.7 pg (ref 26.0–34.0)
MCHC: 36.5 g/dL — ABNORMAL HIGH (ref 30.0–36.0)
MCV: 86.9 fL (ref 80.0–100.0)
Monocytes Absolute: 0.5 10*3/uL (ref 0.1–1.0)
Monocytes Relative: 9 %
Neutro Abs: 3.3 10*3/uL (ref 1.7–7.7)
Neutrophils Relative %: 58 %
Platelet Count: 190 10*3/uL (ref 150–400)
RBC: 4.29 MIL/uL (ref 4.22–5.81)
RDW: 13.2 % (ref 11.5–15.5)
WBC Count: 5.8 10*3/uL (ref 4.0–10.5)
nRBC: 0 % (ref 0.0–0.2)

## 2022-03-12 LAB — MAGNESIUM: Magnesium: 1.2 mg/dL — ABNORMAL LOW (ref 1.7–2.4)

## 2022-03-12 LAB — PHOSPHORUS: Phosphorus: 3.8 mg/dL (ref 2.5–4.6)

## 2022-03-12 MED ORDER — SODIUM CHLORIDE 0.9 % IV SOLN
150.0000 mg | Freq: Once | INTRAVENOUS | Status: AC
  Administered 2022-03-12: 150 mg via INTRAVENOUS
  Filled 2022-03-12: qty 150

## 2022-03-12 MED ORDER — HEPARIN SOD (PORK) LOCK FLUSH 100 UNIT/ML IV SOLN
500.0000 [IU] | Freq: Once | INTRAVENOUS | Status: AC | PRN
  Administered 2022-03-12: 500 [IU]

## 2022-03-12 MED ORDER — SODIUM CHLORIDE 0.9% FLUSH
10.0000 mL | Freq: Once | INTRAVENOUS | Status: AC
  Administered 2022-03-12: 10 mL

## 2022-03-12 MED ORDER — SODIUM CHLORIDE 0.9 % IV SOLN: Freq: Once | INTRAVENOUS | Status: AC

## 2022-03-12 MED ORDER — ATROPINE SULFATE 1 MG/ML IV SOLN
0.5000 mg | Freq: Once | INTRAVENOUS | Status: AC | PRN
  Administered 2022-03-12: 0.5 mg via INTRAVENOUS
  Filled 2022-03-12: qty 1

## 2022-03-12 MED ORDER — SODIUM CHLORIDE 0.9 % IV SOLN
720.0000 mg | Freq: Once | INTRAVENOUS | Status: AC
  Administered 2022-03-12: 720 mg via INTRAVENOUS
  Filled 2022-03-12: qty 72

## 2022-03-12 MED ORDER — FAMOTIDINE IN NACL 20-0.9 MG/50ML-% IV SOLN
20.0000 mg | Freq: Once | INTRAVENOUS | Status: AC
  Administered 2022-03-12: 20 mg via INTRAVENOUS
  Filled 2022-03-12: qty 50

## 2022-03-12 MED ORDER — PALONOSETRON HCL INJECTION 0.25 MG/5ML
0.2500 mg | Freq: Once | INTRAVENOUS | Status: AC
  Administered 2022-03-12: 0.25 mg via INTRAVENOUS
  Filled 2022-03-12: qty 5

## 2022-03-12 MED ORDER — DIPHENHYDRAMINE HCL 50 MG/ML IJ SOLN
50.0000 mg | Freq: Once | INTRAMUSCULAR | Status: AC
  Administered 2022-03-12: 50 mg via INTRAVENOUS
  Filled 2022-03-12: qty 1

## 2022-03-12 MED ORDER — OXYCODONE-ACETAMINOPHEN 10-325 MG PO TABS: 1.0000 | ORAL_TABLET | ORAL | 0 refills | Status: DC | PRN

## 2022-03-12 MED ORDER — ACETAMINOPHEN 325 MG PO TABS
650.0000 mg | ORAL_TABLET | Freq: Once | ORAL | Status: AC
  Administered 2022-03-12: 650 mg via ORAL
  Filled 2022-03-12: qty 2

## 2022-03-12 MED ORDER — SODIUM CHLORIDE 0.9 % IV SOLN
10.0000 mg | Freq: Once | INTRAVENOUS | Status: AC
  Administered 2022-03-12: 10 mg via INTRAVENOUS
  Filled 2022-03-12: qty 10

## 2022-03-12 MED ORDER — SODIUM CHLORIDE 0.9% FLUSH
10.0000 mL | INTRAVENOUS | Status: DC | PRN
  Administered 2022-03-12: 10 mL

## 2022-03-12 NOTE — Patient Instructions (Signed)
[facility] Discharge Instructions for Patients Receiving Chemotherapy  Today you received the following chemotherapy agents trodelvy   To help prevent nausea and vomiting after your treatment, we encourage you to take your nausea medication as directed.   If you develop nausea and vomiting that is not controlled by your nausea medication, call the clinic.   BELOW ARE SYMPTOMS THAT SHOULD BE REPORTED IMMEDIATELY: *FEVER GREATER THAN 100.5 F *CHILLS WITH OR WITHOUT FEVER NAUSEA AND VOMITING THAT IS NOT CONTROLLED WITH YOUR NAUSEA MEDICATION *UNUSUAL SHORTNESS OF BREATH *UNUSUAL BRUISING OR BLEEDING TENDERNESS IN MOUTH AND THROAT WITH OR WITHOUT PRESENCE OF ULCERS *URINARY PROBLEMS *BOWEL PROBLEMS UNUSUAL RASH Items with * indicate a potential emergency and should be followed up as soon as possible.  Feel free to call the clinic you have any questions or concerns. The clinic phone number is [phone].  Sacituzumab Govitecan Injection What is this medication? SACITUZUMAB GOVITECAN (SAK i TOOZ ue mab GOE vi TEE kan) is a monoclonal antibody combined with chemotherapy. It is used to treat breast cancer and urothelial cancer. This medicine may be used for other purposes; ask your health care provider or pharmacist if you have questions. COMMON BRAND NAME(S): TRODELVY What should I tell my care team before I take this medication? They need to know if you have any of these conditions: diarrhea infection (especially a virus infection such as chickenpox, cold sores, or herpes) liver disease low blood counts, like low white cell, platelet, or red cell counts an unusual or allergic reaction to sacituzumab govitecan, other medicines, foods, dyes, or preservatives pregnant or trying to get pregnant breast-feeding How should I use this medication? This medicine is for infusion into a vein. It is given by a healthcare professional in a hospital or clinic setting. Talk to your  pediatrician about the use of this medicine in children. Special care may be needed. Overdosage: If you think you have taken too much of this medicine contact a poison control center or emergency room at once. NOTE: This medicine is only for you. Do not share this medicine with others. What if I miss a dose? Keep appointments for follow-up doses. It is important not to miss your dose. Call your doctor or health care professional if you are unable to keep an appointment. What may interact with this medication? certain antivirals for HIV or hepatitis gemfibrozil This list may not describe all possible interactions. Give your health care provider a list of all the medicines, herbs, non-prescription drugs, or dietary supplements you use. Also tell them if you smoke, drink alcohol, or use illegal drugs. Some items may interact with your medicine. What should I watch for while using this medication? Your condition will be monitored carefully while you are receiving this medicine. You may need blood work done while you are taking this medicine. This medicine can cause serious allergic reactions. To reduce your risk, you may need to take medicine before treatment with this medicine. Take your medicine as directed. Do not become pregnant while taking this medicine or for 6 months after stopping it. Women should inform their health care professional if they wish to become pregnant or think they might be pregnant. Men should not father a child while taking this medicine and for 3 months after stopping it. There is potential for serious side effects to an unborn child. Talk to your health care professional for more information. Do not breast-feed a child while taking this medicine or for 1 month after  stopping it. This medicine may increase your risk of getting an infection. Call your health care professional for advice if you get a fever, chills, or sore throat, or other symptoms of a cold or flu. Do not treat  yourself. Try to avoid being around people who are sick. Avoid taking medicines that contain aspirin, acetaminophen, ibuprofen, naproxen, or ketoprofen unless instructed by your health care professional. These medicines may hide a fever. This medicine has caused ovarian failure in some women. This medicine may make it more difficult to get pregnant. Talk to your health care professional if you are concerned about your fertility. What side effects may I notice from receiving this medication? Side effects that you should report to your doctor or health care professional as soon as possible: allergic reactions like skin rash, itching or hives; swelling of the face, lips, or tongue diarrhea nausea/vomiting signs and symptoms of infection like fever; chills; cough; sore throat; pain or trouble passing urine signs and symptoms of low red blood cells or anemia such as unusually weak or tired; feeling faint or lightheaded; falls; breathing problems Side effects that usually do not require medical attention (report these to your doctor or health care professional if they continue or are bothersome): constipation hair loss headache loss of appetite signs and symptoms of high blood sugar such as being more thirsty or hungry or having to urinate more than normal. You may also feel very tired or have blurry vision. trouble sleeping This list may not describe all possible side effects. Call your doctor for medical advice about side effects. You may report side effects to FDA at 1-800-FDA-1088. Where should I keep my medication? This medicine is given in a hospital or clinic and will not be stored at home. NOTE: This sheet is a summary. It may not cover all possible information. If you have questions about this medicine, talk to your doctor, pharmacist, or health care provider.  2023 Elsevier/Gold Standard (2019-12-21 00:00:00)

## 2022-03-12 NOTE — Telephone Encounter (Signed)
Danny Wood is requesting a refill of Oxycodone-acetaminophen be sent to [facility] in [place].

## 2022-03-13 ENCOUNTER — Telehealth: Payer: Self-pay

## 2022-03-13 NOTE — Telephone Encounter (Signed)
-----   Message from Regan Rakers, RN sent at 03/12/2022  3:30 PM EDT ----- Regarding: Danny Wood, first time chemo First time trodelvy.  Tolerated well.  Shadad.

## 2022-03-13 NOTE — Telephone Encounter (Signed)
Danny Wood states that he is doing fine. He is eating,drinking, and urinating well. He knows to call the office at [phone] if he has any questions or concerns.

## 2022-03-18 MED FILL — Dexamethasone Sodium Phosphate Inj 100 MG/10ML: INTRAMUSCULAR | Qty: 1 | Status: AC

## 2022-03-18 MED FILL — Fosaprepitant Dimeglumine For IV Infusion 150 MG (Base Eq): INTRAVENOUS | Qty: 5 | Status: AC

## 2022-03-19 ENCOUNTER — Inpatient Hospital Stay: Payer: Medicare Other

## 2022-03-19 ENCOUNTER — Encounter: Payer: Self-pay | Admitting: General Practice

## 2022-03-19 ENCOUNTER — Inpatient Hospital Stay: Payer: Medicare Other | Admitting: Dietician

## 2022-03-19 ENCOUNTER — Telehealth: Payer: Self-pay | Admitting: Oncology

## 2022-03-19 ENCOUNTER — Other Ambulatory Visit: Payer: Self-pay

## 2022-03-19 VITALS — BP 106/68 | HR 68 | Temp 97.9°F | Resp 18 | Wt 166.4 lb

## 2022-03-19 DIAGNOSIS — Z5112 Encounter for antineoplastic immunotherapy: Secondary | ICD-10-CM | POA: Diagnosis not present

## 2022-03-19 DIAGNOSIS — C679 Malignant neoplasm of bladder, unspecified: Secondary | ICD-10-CM

## 2022-03-19 DIAGNOSIS — Z95828 Presence of other vascular implants and grafts: Secondary | ICD-10-CM

## 2022-03-19 LAB — CMP (CANCER CENTER ONLY)
ALT: 23 U/L (ref 0–44)
AST: 17 U/L (ref 15–41)
Albumin: 3.7 g/dL (ref 3.5–5.0)
Alkaline Phosphatase: 52 U/L (ref 38–126)
Anion gap: 5 (ref 5–15)
BUN: 25 mg/dL — ABNORMAL HIGH (ref 8–23)
CO2: 31 mmol/L (ref 22–32)
Calcium: 8.9 mg/dL (ref 8.9–10.3)
Chloride: 96 mmol/L — ABNORMAL LOW (ref 98–111)
Creatinine: 0.98 mg/dL (ref 0.61–1.24)
GFR, Estimated: >60 mL/min (ref >60)
Glucose, Bld: 207 mg/dL — ABNORMAL HIGH (ref 70–99)
Potassium: 4 mmol/L (ref 3.5–5.1)
Sodium: 132 mmol/L — ABNORMAL LOW (ref 135–145)
Total Bilirubin: 0.8 mg/dL (ref 0.3–1.2)
Total Protein: 6.2 g/dL — ABNORMAL LOW (ref 6.5–8.1)

## 2022-03-19 LAB — CBC WITH DIFFERENTIAL (CANCER CENTER ONLY)
Abs Immature Granulocytes: 0.06 10*3/uL (ref 0.00–0.07)
Basophils Absolute: 0 10*3/uL (ref 0.0–0.1)
Basophils Relative: 1 %
Eosinophils Absolute: 0 10*3/uL (ref 0.0–0.5)
Eosinophils Relative: 1 %
HCT: 34.8 % — ABNORMAL LOW (ref 39.0–52.0)
Hemoglobin: 12.6 g/dL — ABNORMAL LOW (ref 13.0–17.0)
Immature Granulocytes: 2 %
Lymphocytes Relative: 21 %
Lymphs Abs: 0.8 10*3/uL (ref 0.7–4.0)
MCH: 31 pg (ref 26.0–34.0)
MCHC: 36.2 g/dL — ABNORMAL HIGH (ref 30.0–36.0)
MCV: 85.7 fL (ref 80.0–100.0)
Monocytes Absolute: 0.5 10*3/uL (ref 0.1–1.0)
Monocytes Relative: 13 %
Neutro Abs: 2.5 10*3/uL (ref 1.7–7.7)
Neutrophils Relative %: 62 %
Platelet Count: 172 10*3/uL (ref 150–400)
RBC: 4.06 MIL/uL — ABNORMAL LOW (ref 4.22–5.81)
RDW: 11.9 % (ref 11.5–15.5)
WBC Count: 4 10*3/uL (ref 4.0–10.5)
nRBC: 0 % (ref 0.0–0.2)

## 2022-03-19 LAB — MAGNESIUM: Magnesium: 1.2 mg/dL — ABNORMAL LOW (ref 1.7–2.4)

## 2022-03-19 LAB — PHOSPHORUS: Phosphorus: 3.4 mg/dL (ref 2.5–4.6)

## 2022-03-19 MED ORDER — HEPARIN SOD (PORK) LOCK FLUSH 100 UNIT/ML IV SOLN
500.0000 [IU] | Freq: Once | INTRAVENOUS | Status: AC | PRN
  Administered 2022-03-19: 500 [IU]

## 2022-03-19 MED ORDER — SODIUM CHLORIDE 0.9% FLUSH
10.0000 mL | INTRAVENOUS | Status: DC | PRN
  Administered 2022-03-19: 10 mL

## 2022-03-19 MED ORDER — ACETAMINOPHEN 325 MG PO TABS
650.0000 mg | ORAL_TABLET | Freq: Once | ORAL | Status: AC
  Administered 2022-03-19: 650 mg via ORAL
  Filled 2022-03-19: qty 2

## 2022-03-19 MED ORDER — SODIUM CHLORIDE 0.9 % IV SOLN
10.0000 mg | Freq: Once | INTRAVENOUS | Status: AC
  Administered 2022-03-19: 10 mg via INTRAVENOUS
  Filled 2022-03-19: qty 10

## 2022-03-19 MED ORDER — SODIUM CHLORIDE 0.9 % IV SOLN
720.0000 mg | Freq: Once | INTRAVENOUS | Status: AC
  Administered 2022-03-19: 720 mg via INTRAVENOUS
  Filled 2022-03-19: qty 72

## 2022-03-19 MED ORDER — DIPHENHYDRAMINE HCL 50 MG/ML IJ SOLN
25.0000 mg | Freq: Once | INTRAMUSCULAR | Status: AC
  Administered 2022-03-19: 25 mg via INTRAVENOUS
  Filled 2022-03-19: qty 1

## 2022-03-19 MED ORDER — ATROPINE SULFATE 1 MG/ML IV SOLN
0.5000 mg | Freq: Once | INTRAVENOUS | Status: DC | PRN
  Filled 2022-03-19: qty 1

## 2022-03-19 MED ORDER — FAMOTIDINE IN NACL 20-0.9 MG/50ML-% IV SOLN
20.0000 mg | Freq: Once | INTRAVENOUS | Status: AC
  Administered 2022-03-19: 20 mg via INTRAVENOUS
  Filled 2022-03-19: qty 50

## 2022-03-19 MED ORDER — PALONOSETRON HCL INJECTION 0.25 MG/5ML
0.2500 mg | Freq: Once | INTRAVENOUS | Status: AC
  Administered 2022-03-19: 0.25 mg via INTRAVENOUS
  Filled 2022-03-19: qty 5

## 2022-03-19 MED ORDER — SODIUM CHLORIDE 0.9 % IV SOLN: Freq: Once | INTRAVENOUS | Status: AC

## 2022-03-19 MED ORDER — SODIUM CHLORIDE 0.9% FLUSH
10.0000 mL | Freq: Once | INTRAVENOUS | Status: AC
  Administered 2022-03-19: 10 mL

## 2022-03-19 MED ORDER — SODIUM CHLORIDE 0.9 % IV SOLN
150.0000 mg | Freq: Once | INTRAVENOUS | Status: AC
  Administered 2022-03-19: 150 mg via INTRAVENOUS
  Filled 2022-03-19: qty 150

## 2022-03-19 NOTE — Progress Notes (Signed)
[place] Spiritual Care Note  Referred by nursing for emotional and spiritual support regarding fatigue and discouragement. Met Mr Franko in infusion, bringing a handmade blessing blanket as a tangible gesture of support and encouragement. Included Spiritual Care brochure and card to empower him to reach out as needed. Given his present level of exhaustion, we plan to follow up by phone in a few days.   [place], [place], [facility] Pager [phone] Voicemail [phone]

## 2022-03-19 NOTE — Patient Instructions (Signed)
[facility] Discharge Instructions for Patients Receiving Chemotherapy  Today you received the following chemotherapy agents trodelvy   To help prevent nausea and vomiting after your treatment, we encourage you to take your nausea medication as directed.   If you develop nausea and vomiting that is not controlled by your nausea medication, call the clinic.   BELOW ARE SYMPTOMS THAT SHOULD BE REPORTED IMMEDIATELY: *FEVER GREATER THAN 100.5 F *CHILLS WITH OR WITHOUT FEVER NAUSEA AND VOMITING THAT IS NOT CONTROLLED WITH YOUR NAUSEA MEDICATION *UNUSUAL SHORTNESS OF BREATH *UNUSUAL BRUISING OR BLEEDING TENDERNESS IN MOUTH AND THROAT WITH OR WITHOUT PRESENCE OF ULCERS *URINARY PROBLEMS *BOWEL PROBLEMS UNUSUAL RASH Items with * indicate a potential emergency and should be followed up as soon as possible.  Feel free to call the clinic you have any questions or concerns. The clinic phone number is [phone].  Sacituzumab Govitecan Injection What is this medication? SACITUZUMAB GOVITECAN (SAK i TOOZ ue mab GOE vi TEE kan) is a monoclonal antibody combined with chemotherapy. It is used to treat breast cancer and urothelial cancer. This medicine may be used for other purposes; ask your health care provider or pharmacist if you have questions. COMMON BRAND NAME(S): TRODELVY What should I tell my care team before I take this medication? They need to know if you have any of these conditions: diarrhea infection (especially a virus infection such as chickenpox, cold sores, or herpes) liver disease low blood counts, like low white cell, platelet, or red cell counts an unusual or allergic reaction to sacituzumab govitecan, other medicines, foods, dyes, or preservatives pregnant or trying to get pregnant breast-feeding How should I use this medication? This medicine is for infusion into a vein. It is given by a healthcare professional in a hospital or clinic setting. Talk to your  pediatrician about the use of this medicine in children. Special care may be needed. Overdosage: If you think you have taken too much of this medicine contact a poison control center or emergency room at once. NOTE: This medicine is only for you. Do not share this medicine with others. What if I miss a dose? Keep appointments for follow-up doses. It is important not to miss your dose. Call your doctor or health care professional if you are unable to keep an appointment. What may interact with this medication? certain antivirals for HIV or hepatitis gemfibrozil This list may not describe all possible interactions. Give your health care provider a list of all the medicines, herbs, non-prescription drugs, or dietary supplements you use. Also tell them if you smoke, drink alcohol, or use illegal drugs. Some items may interact with your medicine. What should I watch for while using this medication? Your condition will be monitored carefully while you are receiving this medicine. You may need blood work done while you are taking this medicine. This medicine can cause serious allergic reactions. To reduce your risk, you may need to take medicine before treatment with this medicine. Take your medicine as directed. Do not become pregnant while taking this medicine or for 6 months after stopping it. Women should inform their health care professional if they wish to become pregnant or think they might be pregnant. Men should not father a child while taking this medicine and for 3 months after stopping it. There is potential for serious side effects to an unborn child. Talk to your health care professional for more information. Do not breast-feed a child while taking this medicine or for 1 month after  stopping it. This medicine may increase your risk of getting an infection. Call your health care professional for advice if you get a fever, chills, or sore throat, or other symptoms of a cold or flu. Do not treat  yourself. Try to avoid being around people who are sick. Avoid taking medicines that contain aspirin, acetaminophen, ibuprofen, naproxen, or ketoprofen unless instructed by your health care professional. These medicines may hide a fever. This medicine has caused ovarian failure in some women. This medicine may make it more difficult to get pregnant. Talk to your health care professional if you are concerned about your fertility. What side effects may I notice from receiving this medication? Side effects that you should report to your doctor or health care professional as soon as possible: allergic reactions like skin rash, itching or hives; swelling of the face, lips, or tongue diarrhea nausea/vomiting signs and symptoms of infection like fever; chills; cough; sore throat; pain or trouble passing urine signs and symptoms of low red blood cells or anemia such as unusually weak or tired; feeling faint or lightheaded; falls; breathing problems Side effects that usually do not require medical attention (report these to your doctor or health care professional if they continue or are bothersome): constipation hair loss headache loss of appetite signs and symptoms of high blood sugar such as being more thirsty or hungry or having to urinate more than normal. You may also feel very tired or have blurry vision. trouble sleeping This list may not describe all possible side effects. Call your doctor for medical advice about side effects. You may report side effects to FDA at 1-800-FDA-1088. Where should I keep my medication? This medicine is given in a hospital or clinic and will not be stored at home. NOTE: This sheet is a summary. It may not cover all possible information. If you have questions about this medicine, talk to your doctor, pharmacist, or health care provider.  2023 Elsevier/Gold Standard (2019-12-21 00:00:00)

## 2022-03-19 NOTE — Telephone Encounter (Signed)
Rescheduled August appointments per provider pal, called to inform patient about new upcoming rescheduled appointment. Patient is notified.

## 2022-03-19 NOTE — Progress Notes (Signed)
Patient reported he was feeling dehydrated, unable to eat and, having difficulty with constipation. Patient states he just started milk of mag at home but has not had a full BM in about 8 days. Discussed Miralax. Patient will get this to try. Held atropine today.   Discussed with Dr. Alen Blew and patient will receive 555m of IVF during observation period today. Nutrition contacted to council patient about food choices.    Chaplain also contacted to come see patient as he is feeling this treatment has been significantly difficult mentally.

## 2022-03-19 NOTE — Progress Notes (Signed)
Nutrition Assessment   Reason for Assessment: Urgent request from RN - wt loss   ASSESSMENT: 71 year old male with stage IV urothelial carcinoma of bladder. S/p TURBT in 2015. He is currently receiving trodelvy q21d (started 7/11). Patient is under the care of Dr. Alen Blew.  Past medical history includes DM, dysphagia  Met with patient in infusion. He reports poor appetite. Patient eating 2 small meals. Recalls duck egg salad on english muffin for breakfast, mashed potatoes, spinach, and meat for supper. He reports "picking" during the day. He enjoys oatmeal cookies. Patient will occasionally have a couple bowls of ice cream for dessert. Patient is drinking one [employer]. He is constipated. Says his last BM was ~8 days ago. MD is aware. He has been taking milk of magnesia. This is not helping. Patient typically has a BM every other day. Patient reports "waves" of significant abdominal pain. States he has once fallen into the stove knocking himself out as well as fallen down a set of stairs due to sudden onset of pain.   Nutrition Focused Physical Exam:   Orbital Region: moderate Buccal Region: mild Upper Arm Region: Therapist, art and Lumbar Region: UTA Temple Region: mild Clavicle Bone Region: mild  Shoulder and Acromion Bone Region: UTA Scapular Bone Region: UTA Dorsal Hand: none Patellar Region: UTA Anterior Thigh Region: UTA Posterior Calf Region: UTA Edema (RD assessment): UTA Hair: UTA (pt without hair) Eyes: reviewed Mouth: reviewed Skin: reviewed  Nails: reviewed    Medications: lasix 40 mg, humalog, ms contin, lynparza, percocet, compazine, zoloft   Labs: Mg 1.2, Na 132, Glucose 207, BUN 25   Anthropometrics: Weights have decreased 2% (4 lbs) in 7 days, 5% (9 lbs) in the last 3 weeks. This is significant   Height: 6'2" Weight: 166 lb 6.4 oz UBW: 186 lb (09/13/21) BMI: 21.36  7/11 - 170 lb 8 oz 6/22 - 175 lb 3 oz 5/11 - 177 lb 9.6 oz    NUTRITION  DIAGNOSIS: Unintentional weight loss related to cancer and associated treatment as evidenced by reported poor appetite, constipation, 2% weight loss in one week which is significant    INTERVENTION:  Educated on importance of adequate calorie and protein energy intake to maintain strength/weight Encouraged small frequent meals/snacks vs 2-3 larger meals - handout with ideas + shake recipes provided Suggested keeping snacks downstairs as well as packing "snack bag" to take when away from home for the day Continue drinking [employer] supplement, recommend 2-3/day  Discussed strategies for constipation, suggested he could try 4 oz prune juice Continue bowel regimen per MD Contact information provided    MONITORING, EVALUATION, GOAL: Patient will tolerate increased calories and protein to minimize further weight loss    Next Visit: Tuesday August 8 during infusion

## 2022-03-22 ENCOUNTER — Encounter: Payer: Self-pay | Admitting: General Practice

## 2022-03-22 NOTE — Progress Notes (Signed)
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  He enjoys the satisfaction of a challenge and is feeling hampered by low energy lately. He is working on the identity adjustment that aging and physical exhaustion from treatment often bring.  Provided empathic listening, emotional support, and normalization of feelings. Encouraged Danny Wood and Danny Wood support programming as a means to engage in meaning-making in a less physically demanding way.  Danny Wood is aware of ongoing Spiritual Care availability and plans to reach out as needed/desired.   [place], [place], [facility] Pager [phone] Voicemail [phone]

## 2022-03-25 ENCOUNTER — Other Ambulatory Visit: Payer: Self-pay

## 2022-03-26 ENCOUNTER — Inpatient Hospital Stay: Payer: Medicare Other

## 2022-03-26 ENCOUNTER — Telehealth: Payer: Self-pay | Admitting: *Deleted

## 2022-03-26 ENCOUNTER — Other Ambulatory Visit: Payer: Self-pay | Admitting: *Deleted

## 2022-03-26 ENCOUNTER — Other Ambulatory Visit: Payer: Self-pay

## 2022-03-26 VITALS — BP 99/58 | HR 62 | Temp 97.9°F | Resp 16

## 2022-03-26 DIAGNOSIS — R197 Diarrhea, unspecified: Secondary | ICD-10-CM

## 2022-03-26 DIAGNOSIS — C679 Malignant neoplasm of bladder, unspecified: Secondary | ICD-10-CM

## 2022-03-26 DIAGNOSIS — Z5112 Encounter for antineoplastic immunotherapy: Secondary | ICD-10-CM | POA: Diagnosis not present

## 2022-03-26 MED ORDER — SODIUM CHLORIDE 0.9% FLUSH
10.0000 mL | Freq: Once | INTRAVENOUS | Status: AC
  Administered 2022-03-26: 10 mL via INTRAVENOUS

## 2022-03-26 MED ORDER — HEPARIN SOD (PORK) LOCK FLUSH 100 UNIT/ML IV SOLN
500.0000 [IU] | Freq: Once | INTRAVENOUS | Status: AC
  Administered 2022-03-26: 500 [IU] via INTRAVENOUS

## 2022-03-26 MED ORDER — SODIUM CHLORIDE 0.9 % IV SOLN: Freq: Once | INTRAVENOUS | Status: AC

## 2022-03-26 NOTE — Telephone Encounter (Signed)
Returned PC to patient, he called saying he has been having "extreme diarrhea" for several days, has had 8-10 episodes in the last 24 hours.  Dr Julien Nordmann informed, orders received for patient to come in for IV fluids.  Patient informed to be here at 3:00 for infusion, he verbalizes understanding.  Order entered for IV hydration.

## 2022-03-26 NOTE — Patient Instructions (Signed)
Dehydration, Adult Dehydration is condition in which there is not enough water or other fluids in the body. This happens when a person loses more fluids than he or she takes in. Important body parts cannot work right without the right amount of fluids. Any loss of fluids from the body can cause dehydration. Dehydration can be mild, worse, or very bad. It should be treated right away to keep it from getting very bad. What are the causes? This condition may be caused by: Conditions that cause loss of water or other fluids, such as: Watery poop (diarrhea). Vomiting. Sweating a lot. Peeing (urinating) a lot. Not drinking enough fluids, especially when you: Are ill. Are doing things that take a lot of energy to do. Other illnesses and conditions, such as fever or infection. Certain medicines, such as medicines that take extra fluid out of the body (diuretics). Lack of safe drinking water. Not being able to get enough water and food. What increases the risk? The following factors may make you more likely to develop this condition: Having a long-term (chronic) illness that has not been treated the right way, such as: Diabetes. Heart disease. Kidney disease. Being 65 years of age or older. Having a disability. Living in a place that is high above the ground or sea (high in altitude). The thinner, dried air causes more fluid loss. Doing exercises that put stress on your body for a long time. What are the signs or symptoms? Symptoms of dehydration depend on how bad it is. Mild or worse dehydration Thirst. Dry lips or dry mouth. Feeling dizzy or light-headed, especially when you stand up from sitting. Muscle cramps. Your body making: Dark pee (urine). Pee may be the color of tea. Less pee than normal. Less tears than normal. Headache. Very bad dehydration Changes in skin. Skin may: Be cold to the touch (clammy). Be blotchy or pale. Not go back to normal right after you lightly pinch  it and let it go. Little or no tears, pee, or sweat. Changes in vital signs, such as: Fast breathing. Low blood pressure. Weak pulse. Pulse that is more than 100 beats a minute when you are sitting still. Other changes, such as: Feeling very thirsty. Eyes that look hollow (sunken). Cold hands and feet. Being mixed up (confused). Being very tired (lethargic) or having trouble waking from sleep. Short-term weight loss. Loss of consciousness. How is this treated? Treatment for this condition depends on how bad it is. Treatment should start right away. Do not wait until your condition gets very bad. Very bad dehydration is an emergency. You will need to go to a hospital. Mild or worse dehydration can be treated at home. You may be asked to: Drink more fluids. Drink an oral rehydration solution (ORS). This drink helps get the right amounts of fluids and salts and minerals in the blood (electrolytes). Very bad dehydration can be treated: With fluids through an IV tube. By getting normal levels of salts and minerals in your blood. This is often done by giving salts and minerals through a tube. The tube is passed through your nose and into your stomach. By treating the root cause. Follow these instructions at home: Oral rehydration solution If told by your doctor, drink an ORS: Make an ORS. Use instructions on the package. Start by drinking small amounts, about  cup (120 mL) every 5-10 minutes. Slowly drink more until you have had the amount that your doctor said to have. Eating and drinking          Drink enough clear fluid to keep your pee pale yellow. If you were told to drink an ORS, finish the ORS first. Then, start slowly drinking other clear fluids. Drink fluids such as: Water. Do not drink only water. Doing that can make the salt (sodium) level in your body get too low. Water from ice chips you suck on. Fruit juice that you have added water to (diluted). Low-calorie sports  drinks. Eat foods that have the right amounts of salts and minerals, such as: Bananas. Oranges. Potatoes. Tomatoes. Spinach. Do not drink alcohol. Avoid: Drinks that have a lot of sugar. These include: High-calorie sports drinks. Fruit juice that you did not add water to. Soda. Caffeine. Foods that are greasy or have a lot of fat or sugar. General instructions Take over-the-counter and prescription medicines only as told by your doctor. Do not take salt tablets. Doing that can make the salt level in your body get too high. Return to your normal activities as told by your doctor. Ask your doctor what activities are safe for you. Keep all follow-up visits as told by your doctor. This is important. Contact a doctor if: You have pain in your belly (abdomen) and the pain: Gets worse. Stays in one place. You have a rash. You have a stiff neck. You get angry or annoyed (irritable) more easily than normal. You are more tired or have a harder time waking than normal. You feel: Weak or dizzy. Very thirsty. Get help right away if you have: Any symptoms of very bad dehydration. Symptoms of vomiting, such as: You cannot eat or drink without vomiting. Your vomiting gets worse or does not go away. Your vomit has blood or green stuff in it. Symptoms that get worse with treatment. A fever. A very bad headache. Problems with peeing or pooping (having a bowel movement), such as: Watery poop that gets worse or does not go away. Blood in your poop (stool). This may cause poop to look black and tarry. Not peeing in 6-8 hours. Peeing only a small amount of very dark pee in 6-8 hours. Trouble breathing. These symptoms may be an emergency. Do not wait to see if the symptoms will go away. Get medical help right away. Call your local emergency services (911 in the [place] Do not drive yourself to the hospital. Summary Dehydration is a condition in which there is not enough water or other fluids  in the body. This happens when a person loses more fluids than he or she takes in. Treatment for this condition depends on how bad it is. Treatment should be started right away. Do not wait until your condition gets very bad. Drink enough clear fluid to keep your pee pale yellow. If you were told to drink an oral rehydration solution (ORS), finish the ORS first. Then, start slowly drinking other clear fluids. Take over-the-counter and prescription medicines only as told by your doctor. Get help right away if you have any symptoms of very bad dehydration. This information is not intended to replace advice given to you by your health care provider. Make sure you discuss any questions you have with your health care provider. Document Revised: 04/01/2019 Document Reviewed: 04/01/2019 Elsevier Patient Education  [place].  Rehydration, Adult Rehydration is the replacement of body fluids, salts, and minerals (electrolytes) that are lost during dehydration. Dehydration is when there is not enough water or other fluids in the body. This happens when you lose more fluids than you take in. Common causes  of dehydration include: Not drinking enough fluids. This can occur when you are ill or doing activities that require a lot of energy, especially in hot weather. Conditions that cause loss of water or other fluids, such as diarrhea, vomiting, sweating, or urinating a lot. Other illnesses, such as fever or infection. Certain medicines, such as those that remove excess fluid from the body (diuretics). Symptoms of mild or moderate dehydration may include thirst, dry lips and mouth, and dizziness. Symptoms of severe dehydration may include increased heart rate, confusion, fainting, and not urinating. For severe dehydration, you may need to get fluids through an IV at the hospital. For mild or moderate dehydration, you can usually rehydrate at home by drinking certain fluids as told by your health care  provider. What are the risks? Generally, rehydration is safe. However, taking in too much fluid (overhydration) can be a problem. This is rare. Overhydration can cause an electrolyte imbalance, kidney failure, or a decrease in salt (sodium) levels in the body. Supplies needed You will need an oral rehydration solution (ORS) if your health care provider tells you to use one. This is a drink to treat dehydration. It can be found in pharmacies and retail stores. How to rehydrate Fluids Follow instructions from your health care provider for rehydration. The kind of fluid and the amount you should drink depend on your condition. In general, you should choose drinks that you prefer. If told by your health care provider, drink an ORS. Make an ORS by following instructions on the package. Start by drinking small amounts, about  cup (120 mL) every 5-10 minutes. Slowly increase how much you drink until you have taken the amount recommended by your health care provider. Drink enough clear fluids to keep your urine pale yellow. If you were told to drink an ORS, finish it first, then start slowly drinking other clear fluids. Drink fluids such as: Water. This includes sparkling water and flavored water. Drinking only water can lead to having too little sodium in your body (hyponatremia). Follow the advice of your health care provider. Water from ice chips you suck on. Fruit juice with water you add to it (diluted). Sports drinks. Hot or cold herbal teas. Broth-based soups. Milk or milk products. Food Follow instructions from your health care provider about what to eat while you rehydrate. Your health care provider may recommend that you slowly begin eating regular foods in small amounts. Eat foods that contain a healthy balance of electrolytes, such as bananas, oranges, potatoes, tomatoes, and spinach. Avoid foods that are greasy or contain a lot of sugar. In some cases, you may get nutrition through a  feeding tube that is passed through your nose and into your stomach (nasogastric tube, or NG tube). This may be done if you have uncontrolled vomiting or diarrhea. Beverages to avoid  Certain beverages may make dehydration worse. While you rehydrate, avoid drinking alcohol. How to tell if you are recovering from dehydration You may be recovering from dehydration if: You are urinating more often than before you started rehydrating. Your urine is pale yellow. Your energy level improves. You vomit less frequently. You have diarrhea less frequently. Your appetite improves or returns to normal. You feel less dizzy or less light-headed. Your skin tone and color start to look more normal. Follow these instructions at home: Take over-the-counter and prescription medicines only as told by your health care provider. Do not take sodium tablets. Doing this can lead to having too much sodium in your  body (hypernatremia). Contact a health care provider if: You continue to have symptoms of mild or moderate dehydration, such as: Thirst. Dry lips. Slightly dry mouth. Dizziness. Dark urine or less urine than normal. Muscle cramps. You continue to vomit or have diarrhea. Get help right away if you: Have symptoms of dehydration that get worse. Have a fever. Have a severe headache. Have been vomiting and the following happens: Your vomiting gets worse or does not go away. Your vomit includes blood or green matter (bile). You cannot eat or drink without vomiting. Have problems with urination or bowel movements, such as: Diarrhea that gets worse or does not go away. Blood in your stool (feces). This may cause stool to look black and tarry. Not urinating, or urinating only a small amount of very dark urine, within 6-8 hours. Have trouble breathing. Have symptoms that get worse with treatment. These symptoms may represent a serious problem that is an emergency. Do not wait to see if the symptoms will  go away. Get medical help right away. Call your local emergency services (911 in the [place] Do not drive yourself to the hospital. Summary Rehydration is the replacement of body fluids and minerals (electrolytes) that are lost during dehydration. Follow instructions from your health care provider for rehydration. The kind of fluid and amount you should drink depend on your condition. Slowly increase how much you drink until you have taken the amount recommended by your health care provider. Contact your health care provider if you continue to show signs of mild or moderate dehydration. This information is not intended to replace advice given to you by your health care provider. Make sure you discuss any questions you have with your health care provider. Document Revised: 10/20/2019 Document Reviewed: 08/30/2019 Elsevier Patient Education  [place].

## 2022-04-01 MED FILL — Fosaprepitant Dimeglumine For IV Infusion 150 MG (Base Eq): INTRAVENOUS | Qty: 5 | Status: AC

## 2022-04-01 MED FILL — Dexamethasone Sodium Phosphate Inj 100 MG/10ML: INTRAMUSCULAR | Qty: 1 | Status: AC

## 2022-04-02 ENCOUNTER — Inpatient Hospital Stay: Payer: Medicare Other

## 2022-04-02 ENCOUNTER — Other Ambulatory Visit: Payer: Self-pay

## 2022-04-02 ENCOUNTER — Inpatient Hospital Stay: Payer: Medicare Other | Admitting: Dietician

## 2022-04-02 ENCOUNTER — Inpatient Hospital Stay: Payer: Medicare Other | Attending: Oncology | Admitting: Oncology

## 2022-04-02 VITALS — BP 121/67 | HR 59 | Temp 98.0°F | Resp 59 | Ht 74.0 in | Wt 169.8 lb

## 2022-04-02 VITALS — BP 113/64 | HR 62 | Resp 16

## 2022-04-02 DIAGNOSIS — G629 Polyneuropathy, unspecified: Secondary | ICD-10-CM | POA: Diagnosis not present

## 2022-04-02 DIAGNOSIS — R197 Diarrhea, unspecified: Secondary | ICD-10-CM | POA: Insufficient documentation

## 2022-04-02 DIAGNOSIS — Z5112 Encounter for antineoplastic immunotherapy: Secondary | ICD-10-CM | POA: Diagnosis present

## 2022-04-02 DIAGNOSIS — E860 Dehydration: Secondary | ICD-10-CM | POA: Insufficient documentation

## 2022-04-02 DIAGNOSIS — D709 Neutropenia, unspecified: Secondary | ICD-10-CM | POA: Diagnosis not present

## 2022-04-02 DIAGNOSIS — C679 Malignant neoplasm of bladder, unspecified: Secondary | ICD-10-CM

## 2022-04-02 DIAGNOSIS — L304 Erythema intertrigo: Secondary | ICD-10-CM | POA: Insufficient documentation

## 2022-04-02 DIAGNOSIS — R590 Localized enlarged lymph nodes: Secondary | ICD-10-CM | POA: Diagnosis not present

## 2022-04-02 DIAGNOSIS — R112 Nausea with vomiting, unspecified: Secondary | ICD-10-CM | POA: Diagnosis not present

## 2022-04-02 DIAGNOSIS — Z79899 Other long term (current) drug therapy: Secondary | ICD-10-CM | POA: Insufficient documentation

## 2022-04-02 DIAGNOSIS — Z9079 Acquired absence of other genital organ(s): Secondary | ICD-10-CM | POA: Diagnosis not present

## 2022-04-02 DIAGNOSIS — Z85528 Personal history of other malignant neoplasm of kidney: Secondary | ICD-10-CM | POA: Diagnosis not present

## 2022-04-02 DIAGNOSIS — Z79891 Long term (current) use of opiate analgesic: Secondary | ICD-10-CM | POA: Diagnosis not present

## 2022-04-02 DIAGNOSIS — Z95828 Presence of other vascular implants and grafts: Secondary | ICD-10-CM

## 2022-04-02 DIAGNOSIS — G893 Neoplasm related pain (acute) (chronic): Secondary | ICD-10-CM | POA: Insufficient documentation

## 2022-04-02 LAB — CBC WITH DIFFERENTIAL (CANCER CENTER ONLY)
Abs Immature Granulocytes: 0 10*3/uL (ref 0.00–0.07)
Basophils Absolute: 0 10*3/uL (ref 0.0–0.1)
Basophils Relative: 1 %
Eosinophils Absolute: 0.1 10*3/uL (ref 0.0–0.5)
Eosinophils Relative: 4 %
HCT: 33.8 % — ABNORMAL LOW (ref 39.0–52.0)
Hemoglobin: 12.5 g/dL — ABNORMAL LOW (ref 13.0–17.0)
Immature Granulocytes: 0 %
Lymphocytes Relative: 45 %
Lymphs Abs: 1.4 10*3/uL (ref 0.7–4.0)
MCH: 31.3 pg (ref 26.0–34.0)
MCHC: 37 g/dL — ABNORMAL HIGH (ref 30.0–36.0)
MCV: 84.5 fL (ref 80.0–100.0)
Monocytes Absolute: 0.3 10*3/uL (ref 0.1–1.0)
Monocytes Relative: 10 %
Neutro Abs: 1.2 10*3/uL — ABNORMAL LOW (ref 1.7–7.7)
Neutrophils Relative %: 40 %
Platelet Count: 209 10*3/uL (ref 150–400)
RBC: 4 MIL/uL — ABNORMAL LOW (ref 4.22–5.81)
RDW: 12.3 % (ref 11.5–15.5)
WBC Count: 3.1 10*3/uL — ABNORMAL LOW (ref 4.0–10.5)
nRBC: 0 % (ref 0.0–0.2)

## 2022-04-02 LAB — CMP (CANCER CENTER ONLY)
ALT: 27 U/L (ref 0–44)
AST: 21 U/L (ref 15–41)
Albumin: 3.4 g/dL — ABNORMAL LOW (ref 3.5–5.0)
Alkaline Phosphatase: 59 U/L (ref 38–126)
Anion gap: 5 (ref 5–15)
BUN: 19 mg/dL (ref 8–23)
CO2: 27 mmol/L (ref 22–32)
Calcium: 8 mg/dL — ABNORMAL LOW (ref 8.9–10.3)
Chloride: 104 mmol/L (ref 98–111)
Creatinine: 0.84 mg/dL (ref 0.61–1.24)
GFR, Estimated: >60 mL/min (ref >60)
Glucose, Bld: 193 mg/dL — ABNORMAL HIGH (ref 70–99)
Potassium: 3.7 mmol/L (ref 3.5–5.1)
Sodium: 136 mmol/L (ref 135–145)
Total Bilirubin: 0.2 mg/dL — ABNORMAL LOW (ref 0.3–1.2)
Total Protein: 5.9 g/dL — ABNORMAL LOW (ref 6.5–8.1)

## 2022-04-02 LAB — PHOSPHORUS: Phosphorus: 3.2 mg/dL (ref 2.5–4.6)

## 2022-04-02 LAB — MAGNESIUM: Magnesium: 1.1 mg/dL — ABNORMAL LOW (ref 1.7–2.4)

## 2022-04-02 MED ORDER — DIPHENHYDRAMINE HCL 50 MG/ML IJ SOLN
25.0000 mg | Freq: Once | INTRAMUSCULAR | Status: AC
  Administered 2022-04-02: 25 mg via INTRAVENOUS
  Filled 2022-04-02: qty 1

## 2022-04-02 MED ORDER — SODIUM CHLORIDE 0.9 % IV SOLN
10.0000 mg/kg | Freq: Once | INTRAVENOUS | Status: AC
  Administered 2022-04-02: 800 mg via INTRAVENOUS
  Filled 2022-04-02: qty 80

## 2022-04-02 MED ORDER — ATROPINE SULFATE 1 MG/ML IV SOLN
0.5000 mg | Freq: Once | INTRAVENOUS | Status: AC | PRN
  Administered 2022-04-02: 0.5 mg via INTRAVENOUS
  Filled 2022-04-02: qty 1

## 2022-04-02 MED ORDER — PALONOSETRON HCL INJECTION 0.25 MG/5ML
0.2500 mg | Freq: Once | INTRAVENOUS | Status: AC
  Administered 2022-04-02: 0.25 mg via INTRAVENOUS
  Filled 2022-04-02: qty 5

## 2022-04-02 MED ORDER — ACETAMINOPHEN 325 MG PO TABS
650.0000 mg | ORAL_TABLET | Freq: Once | ORAL | Status: AC
  Administered 2022-04-02: 650 mg via ORAL
  Filled 2022-04-02: qty 2

## 2022-04-02 MED ORDER — SODIUM CHLORIDE 0.9 % IV SOLN
150.0000 mg | Freq: Once | INTRAVENOUS | Status: AC
  Administered 2022-04-02: 150 mg via INTRAVENOUS
  Filled 2022-04-02: qty 150

## 2022-04-02 MED ORDER — SODIUM CHLORIDE 0.9 % IV SOLN
10.0000 mg | Freq: Once | INTRAVENOUS | Status: AC
  Administered 2022-04-02: 10 mg via INTRAVENOUS
  Filled 2022-04-02: qty 10

## 2022-04-02 MED ORDER — SODIUM CHLORIDE 0.9 % IV SOLN: Freq: Once | INTRAVENOUS | Status: AC

## 2022-04-02 MED ORDER — SODIUM CHLORIDE 0.9% FLUSH
10.0000 mL | INTRAVENOUS | Status: DC | PRN
  Administered 2022-04-02: 10 mL

## 2022-04-02 MED ORDER — FAMOTIDINE IN NACL 20-0.9 MG/50ML-% IV SOLN
20.0000 mg | Freq: Once | INTRAVENOUS | Status: AC
  Administered 2022-04-02: 20 mg via INTRAVENOUS
  Filled 2022-04-02: qty 50

## 2022-04-02 MED ORDER — SODIUM CHLORIDE 0.9% FLUSH
10.0000 mL | Freq: Once | INTRAVENOUS | Status: AC
  Administered 2022-04-02: 10 mL

## 2022-04-02 MED ORDER — HEPARIN SOD (PORK) LOCK FLUSH 100 UNIT/ML IV SOLN
500.0000 [IU] | Freq: Once | INTRAVENOUS | Status: AC | PRN
  Administered 2022-04-02: 500 [IU]

## 2022-04-02 NOTE — Progress Notes (Signed)
Hematology and Oncology Follow Up  Danny Wood [id number] 12/16/1950 71 y.o. 04/02/2022 10:51 AM Danny Wood, [name], MD      Principle Diagnosis:  71 year old man with bladder cancer diagnosed in 2015.  He developed stage IV high-grade urothelial carcinoma with pelvic adenopathy diagnosed in 2019.      Prior Therapy:  He underwent a cystoscopy and a TURBT on 05/17/2014.    Neoadjuvant systemic chemotherapy in the form of gemcitabine and cisplatin cycle 1 day 1 is on 06/17/2014. He is S/P two cycles completed in 07/2014.  Therapy tolerated poorly with symptoms of nausea and vomiting and worsening renal function.   He is status post robotic cystoprostatectomy and bilateral lymphadenectomy completed on September 09, 2014.  The final pathology revealed T2N0 without any lymph node involvement.   He developed pelvic adenopathy that was biopsy-proven on Jan 14, 2018 to be metastatic urothelial carcinoma.   Carboplatin and gemcitabine cycle 1 started on 02/17/2018.  He completed 8 cycles of therapy in December 2019.   Pembrolizumab 200 mg every 3 weeks started on April 01, 2019.  He is status post 7 cycles of therapy in December 2020.  Therapy stopped because of of patient preference, excellent clinical response and treatment holiday.  He developed progression of disease in June 2021.   Padcev started on February 11, 2020.  He has been receiving monthly maintenance therapy up till March 2023.  He developed disease progression at that time.  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  Lynparza 300 mg twice a day started on December 26, 2021.  Therapy discontinued in July 2023 for progression of disease.       Current therapy:  sacituzumab govitecan started on 9 March 12, 2022.  He is here for day 1 cycle 2 of therapy.          Interim History: [name] returns today for a follow-up.  Since last visit, he completed the first cycle of sacituzumab  govitecan.  He has tolerated therapy well without any major complaints.  He has reported some diarrhea last week which improved with Lomotil.  He denies any nausea, vomiting or abdominal pain.  Continues to have pain issues that are manageable with the help of follow-up morphine and oxycodone.  His performance status is reasonable but slightly declining.  His appetite is down lost more weight.  His p.o. intake has decreased slightly.   Medications: Updated on review. Current Outpatient Medications  Medication Sig Dispense Refill   diphenoxylate-atropine (LOMOTIL) 2.5-0.025 MG tablet 1 to 2 PO QID prn diarrhea 45 tablet 2   furosemide (LASIX) 20 MG tablet Take 2 tablets (40 mg total) by mouth daily. 90 tablet 1   Insulin Glargine (BASAGLAR KWIKPEN) 100 UNIT/ML Inject 8 Units into the skin every morning. 9 mL 2   insulin lispro (HUMALOG KWIKPEN) 100 UNIT/ML KwikPen Inject 2 Units into the skin daily with supper. 1.8 mL 3   morphine (MS CONTIN) 30 MG 12 hr tablet Take 1 tablet (30 mg total) by mouth every 12 (twelve) hours. 60 tablet 0   olaparib (LYNPARZA) 150 MG tablet Take 2 tablets (300 mg total) by mouth 2 (two) times daily. Swallow whole. May take with food to decrease nausea and vomiting. 120 tablet 1   oxyCODONE-acetaminophen (PERCOCET) 10-325 MG tablet Take 1 tablet by mouth every 4 (four) hours as needed for pain. 90 tablet 0   prochlorperazine (COMPAZINE) 10 MG tablet TAKE 1 TABLET(10 MG) BY MOUTH  EVERY 6 HOURS AS NEEDED FOR NAUSEA OR VOMITING 30 tablet 0   sertraline (ZOLOFT) 25 MG tablet Take 1 tablet (25 mg total) by mouth at bedtime. 30 tablet 3   No current facility-administered medications for this visit.   Physical exam Blood pressure 121/67, pulse (!) 59, temperature 98 F (36.7 C), temperature source Oral, resp. rate (!) 59, height _0  (1.88 m), weight 169 lb 12.8 oz (77 kg), SpO2 100 %.  ECOG 1  General appearance: Comfortable appearing without any discomfort Head:  Normocephalic without any trauma Oropharynx: Mucous membranes are moist and pink without any thrush or ulcers. Eyes: Pupils are equal and round reactive to light. Lymph nodes: No cervical, supraclavicular, inguinal or axillary lymphadenopathy.   Heart:regular rate and rhythm.  S1 and S2 without leg edema. Lung: Clear without any rhonchi or wheezes.  No dullness to percussion. Abdomin: Soft, nontender, nondistended with good bowel sounds.  No hepatosplenomegaly. Musculoskeletal: No joint deformity or effusion.  Full range of motion noted. Neurological: No deficits noted on motor, sensory and deep tendon reflex exam. Skin: No petechial rash or dryness.  Appeared moist.  Psychiatric: Mood and affect appeared appropriate.     Lab Results: Lab Results  Component Value Date   WBC 4.0 03/19/2022   HGB 12.6 (L) 03/19/2022   HCT 34.8 (L) 03/19/2022   MCV 85.7 03/19/2022   PLT 172 03/19/2022     Chemistry      Component Value Date/Time   NA 132 (L) 03/19/2022 0915   NA 139 08/05/2014 1335   K 4.0 03/19/2022 0915   K 4.3 08/05/2014 1335   CL 96 (L) 03/19/2022 0915   CO2 31 03/19/2022 0915   CO2 26 08/05/2014 1335   BUN 25 (H) 03/19/2022 0915   BUN 18.2 08/05/2014 1335   CREATININE 0.98 03/19/2022 0915   CREATININE 1.1 08/05/2014 1335      Component Value Date/Time   CALCIUM 8.9 03/19/2022 0915   CALCIUM 9.5 08/05/2014 1335   ALKPHOS 52 03/19/2022 0915   ALKPHOS 98 08/05/2014 1335   AST 17 03/19/2022 0915   AST 45 (H) 08/05/2014 1335   ALT 23 03/19/2022 0915   ALT 73 (H) 08/05/2014 1335   BILITOT 0.8 03/19/2022 0915   BILITOT 0.47 08/05/2014 1335         Impression and Plan:  71 year old man with:       1.  Bladder cancer diagnosed in 2019.  He developed stage IV high-grade urothelial carcinoma with pelvic adenopathy.   He is currently on sacituzumab govitecan salvage treatment and completed the first cycle.  Risks and benefits of proceeding with the second cycle  were discussed at this time.  He is complications, nausea, vomiting, myelosuppression, neutropenia as well as neuropathy.  He is agreeable to proceed and the plan is to update his staging scans after cycle 4.  Alternative treatment options would include supportive care only, radiation therapy and single agent taxane.  2. Pain: Related to his malignancy and lymphadenopathy.  He is currently on morphine and oxycodone.  Pain is manageable.   3.  IV access: Port-A-Cath remains in place without any issues.  4.  Antiemetics: Compazine is available to him without any nausea or vomiting.  5.  GI complications: He has experienced diarrhea and mild dehydration.  We will replace with intravenous normal saline today.  6.  Follow-up: In 3 weeks for the next cycle of therapy.   30  minutes were dedicated to this visit.  The time was spent on reviewing laboratory data, discussing treatment options, discussing complications related to his cancer and cancer therapy and future plan of care review.  [name], MD 04/02/2022 10:51 AM

## 2022-04-02 NOTE — Progress Notes (Signed)
Ok to treat today with an ANC of 1.2.  Patient to get IVF

## 2022-04-02 NOTE — Patient Instructions (Signed)
[facility] Discharge Instructions for Patients Receiving Chemotherapy  Today you received the following chemotherapy agents: Danny Wood   To help prevent nausea and vomiting after your treatment, we encourage you to take your nausea medication as directed.   If you develop nausea and vomiting that is not controlled by your nausea medication, call the clinic.   BELOW ARE SYMPTOMS THAT SHOULD BE REPORTED IMMEDIATELY: *FEVER GREATER THAN 100.5 F *CHILLS WITH OR WITHOUT FEVER NAUSEA AND VOMITING THAT IS NOT CONTROLLED WITH YOUR NAUSEA MEDICATION *UNUSUAL SHORTNESS OF BREATH *UNUSUAL BRUISING OR BLEEDING TENDERNESS IN MOUTH AND THROAT WITH OR WITHOUT PRESENCE OF ULCERS *URINARY PROBLEMS *BOWEL PROBLEMS UNUSUAL RASH Items with * indicate a potential emergency and should be followed up as soon as possible.  Feel free to call the clinic you have any questions or concerns. The clinic phone number is [phone].  Sacituzumab Govitecan Injection What is this medication? SACITUZUMAB GOVITECAN (SAK i TOOZ ue mab GOE vi TEE kan) is a monoclonal antibody combined with chemotherapy. It is used to treat breast cancer and urothelial cancer. This medicine may be used for other purposes; ask your health care provider or pharmacist if you have questions. COMMON BRAND NAME(S): TRODELVY What should I tell my care team before I take this medication? They need to know if you have any of these conditions: diarrhea infection (especially a virus infection such as chickenpox, cold sores, or herpes) liver disease low blood counts, like low white cell, platelet, or red cell counts an unusual or allergic reaction to sacituzumab govitecan, other medicines, foods, dyes, or preservatives pregnant or trying to get pregnant breast-feeding How should I use this medication? This medicine is for infusion into a vein. It is given by a healthcare professional in a hospital or clinic setting. Talk to  your pediatrician about the use of this medicine in children. Special care may be needed. Overdosage: If you think you have taken too much of this medicine contact a poison control center or emergency room at once. NOTE: This medicine is only for you. Do not share this medicine with others. What if I miss a dose? Keep appointments for follow-up doses. It is important not to miss your dose. Call your doctor or health care professional if you are unable to keep an appointment. What may interact with this medication? certain antivirals for HIV or hepatitis gemfibrozil This list may not describe all possible interactions. Give your health care provider a list of all the medicines, herbs, non-prescription drugs, or dietary supplements you use. Also tell them if you smoke, drink alcohol, or use illegal drugs. Some items may interact with your medicine. What should I watch for while using this medication? Your condition will be monitored carefully while you are receiving this medicine. You may need blood work done while you are taking this medicine. This medicine can cause serious allergic reactions. To reduce your risk, you may need to take medicine before treatment with this medicine. Take your medicine as directed. Do not become pregnant while taking this medicine or for 6 months after stopping it. Women should inform their health care professional if they wish to become pregnant or think they might be pregnant. Men should not father a child while taking this medicine and for 3 months after stopping it. There is potential for serious side effects to an unborn child. Talk to your health care professional for more information. Do not breast-feed a child while taking this medicine or for 1 month after  stopping it. This medicine may increase your risk of getting an infection. Call your health care professional for advice if you get a fever, chills, or sore throat, or other symptoms of a cold or flu. Do not  treat yourself. Try to avoid being around people who are sick. Avoid taking medicines that contain aspirin, acetaminophen, ibuprofen, naproxen, or ketoprofen unless instructed by your health care professional. These medicines may hide a fever. This medicine has caused ovarian failure in some women. This medicine may make it more difficult to get pregnant. Talk to your health care professional if you are concerned about your fertility. What side effects may I notice from receiving this medication? Side effects that you should report to your doctor or health care professional as soon as possible: allergic reactions like skin rash, itching or hives; swelling of the face, lips, or tongue diarrhea nausea/vomiting signs and symptoms of infection like fever; chills; cough; sore throat; pain or trouble passing urine signs and symptoms of low red blood cells or anemia such as unusually weak or tired; feeling faint or lightheaded; falls; breathing problems Side effects that usually do not require medical attention (report these to your doctor or health care professional if they continue or are bothersome): constipation hair loss headache loss of appetite signs and symptoms of high blood sugar such as being more thirsty or hungry or having to urinate more than normal. You may also feel very tired or have blurry vision. trouble sleeping This list may not describe all possible side effects. Call your doctor for medical advice about side effects. You may report side effects to FDA at 1-800-FDA-1088. Where should I keep my medication? This medicine is given in a hospital or clinic and will not be stored at home. NOTE: This sheet is a summary. It may not cover all possible information. If you have questions about this medicine, talk to your doctor, pharmacist, or health care provider.  2023 Elsevier/Gold Standard (2019-12-21 00:00:00)

## 2022-04-02 NOTE — Progress Notes (Signed)
Spoke w/ MD Alen Blew and he would like to hold off on giving Magnesium supplementation  Larene Beach, PharmD

## 2022-04-02 NOTE — Progress Notes (Signed)
Nutrition Follow-up:  Patient with urothelial carcinoma of bladder. He is receiving trodelvy q21d.   Met with patient in infusion. He reports appetite is slowly improving. Recalls 3 small meals daily. He is drinking 1-2 [employer] supplements. Patient endorses altered taste, says food taste like battery acid. He recalls eating at [place] buffet recently which tasted good to him. His wife is out of town for a few days and plans to eat here while she is away. Patient requested strawberry Ensure to drink. He likes the taste. Patient agreeable to increase supplement intake if he can find this flavor at the store. He denies nausea, vomiting, constipation. Diarrhea has resolved.    Medications: reviewed   Labs: glucose 193, Mg 1.1 (pt received IVF today)  Anthropometrics: Weight 169 lb 12.8 oz today increased  7/18 - 166 lb 6.4 oz  6/22 - 175 lb 3 oz    NUTRITION DIAGNOSIS: Unintentional weight loss    INTERVENTION:  Encouraged smaller meals more frequently, bites q2h Discussed ways to add calories and protein to foods, educated to have protein source with all meals/snacks Discussed strategies for altered taste - handout with tips provided Recommend baking soda salt water rinses several times daily before meals - recipe provided Recommend 2-3 Ensure Plus/equivalent daily - coupons given  Encouraged pt to try shake recipes     MONITORING, EVALUATION, GOAL: weight trends, intake    NEXT VISIT: Tuesday August 8 during infusion

## 2022-04-04 ENCOUNTER — Other Ambulatory Visit: Payer: Self-pay

## 2022-04-04 ENCOUNTER — Telehealth: Payer: Self-pay | Admitting: *Deleted

## 2022-04-04 ENCOUNTER — Other Ambulatory Visit: Payer: Self-pay | Admitting: Oncology

## 2022-04-04 DIAGNOSIS — C679 Malignant neoplasm of bladder, unspecified: Secondary | ICD-10-CM

## 2022-04-04 MED ORDER — MORPHINE SULFATE ER 30 MG PO TBCR
30.0000 mg | EXTENDED_RELEASE_TABLET | Freq: Two times a day (BID) | ORAL | 0 refills | Status: DC
Start: 2022-04-04 — End: 2022-05-15

## 2022-04-04 MED ORDER — OXYCODONE-ACETAMINOPHEN 10-325 MG PO TABS: 1.0000 | ORAL_TABLET | ORAL | 0 refills | Status: DC | PRN

## 2022-04-04 NOTE — Telephone Encounter (Signed)
Danny Wood is requesting a refill of Morphine and Oxycodone-acetaminophen to be sent to [facility] on [place]

## 2022-04-08 ENCOUNTER — Telehealth: Payer: Self-pay | Admitting: Physician Assistant

## 2022-04-08 MED FILL — Fosaprepitant Dimeglumine For IV Infusion 150 MG (Base Eq): INTRAVENOUS | Qty: 5 | Status: AC

## 2022-04-08 MED FILL — Dexamethasone Sodium Phosphate Inj 100 MG/10ML: INTRAMUSCULAR | Qty: 1 | Status: AC

## 2022-04-08 NOTE — Telephone Encounter (Signed)
Scheduled per 08/01 los, patient has been called and notified of all upcoming appointments.

## 2022-04-09 ENCOUNTER — Other Ambulatory Visit: Payer: Self-pay

## 2022-04-09 ENCOUNTER — Inpatient Hospital Stay: Payer: Medicare Other

## 2022-04-09 ENCOUNTER — Inpatient Hospital Stay: Payer: Medicare Other | Admitting: Dietician

## 2022-04-09 VITALS — BP 105/67 | HR 72 | Temp 98.2°F | Resp 17 | Wt 169.0 lb

## 2022-04-09 DIAGNOSIS — C679 Malignant neoplasm of bladder, unspecified: Secondary | ICD-10-CM

## 2022-04-09 DIAGNOSIS — Z5112 Encounter for antineoplastic immunotherapy: Secondary | ICD-10-CM | POA: Diagnosis not present

## 2022-04-09 DIAGNOSIS — Z95828 Presence of other vascular implants and grafts: Secondary | ICD-10-CM

## 2022-04-09 LAB — CBC WITH DIFFERENTIAL (CANCER CENTER ONLY)
Abs Immature Granulocytes: 0.02 10*3/uL (ref 0.00–0.07)
Basophils Absolute: 0 10*3/uL (ref 0.0–0.1)
Basophils Relative: 1 %
Eosinophils Absolute: 0 10*3/uL (ref 0.0–0.5)
Eosinophils Relative: 2 %
HCT: 31.5 % — ABNORMAL LOW (ref 39.0–52.0)
Hemoglobin: 11.3 g/dL — ABNORMAL LOW (ref 13.0–17.0)
Immature Granulocytes: 1 %
Lymphocytes Relative: 53 %
Lymphs Abs: 1.4 10*3/uL (ref 0.7–4.0)
MCH: 30.5 pg (ref 26.0–34.0)
MCHC: 35.9 g/dL (ref 30.0–36.0)
MCV: 84.9 fL (ref 80.0–100.0)
Monocytes Absolute: 0.4 10*3/uL (ref 0.1–1.0)
Monocytes Relative: 14 %
Neutro Abs: 0.8 10*3/uL — ABNORMAL LOW (ref 1.7–7.7)
Neutrophils Relative %: 29 %
Platelet Count: 183 10*3/uL (ref 150–400)
RBC: 3.71 MIL/uL — ABNORMAL LOW (ref 4.22–5.81)
RDW: 12 % (ref 11.5–15.5)
WBC Count: 2.7 10*3/uL — ABNORMAL LOW (ref 4.0–10.5)
nRBC: 0 % (ref 0.0–0.2)

## 2022-04-09 LAB — CMP (CANCER CENTER ONLY)
ALT: 26 U/L (ref 0–44)
AST: 20 U/L (ref 15–41)
Albumin: 3.4 g/dL — ABNORMAL LOW (ref 3.5–5.0)
Alkaline Phosphatase: 58 U/L (ref 38–126)
Anion gap: 3 — ABNORMAL LOW (ref 5–15)
BUN: 21 mg/dL (ref 8–23)
CO2: 31 mmol/L (ref 22–32)
Calcium: 8.2 mg/dL — ABNORMAL LOW (ref 8.9–10.3)
Chloride: 103 mmol/L (ref 98–111)
Creatinine: 0.91 mg/dL (ref 0.61–1.24)
GFR, Estimated: >60 mL/min (ref >60)
Glucose, Bld: 160 mg/dL — ABNORMAL HIGH (ref 70–99)
Potassium: 3.9 mmol/L (ref 3.5–5.1)
Sodium: 137 mmol/L (ref 135–145)
Total Bilirubin: 0.3 mg/dL (ref 0.3–1.2)
Total Protein: 5.7 g/dL — ABNORMAL LOW (ref 6.5–8.1)

## 2022-04-09 LAB — MAGNESIUM: Magnesium: 1.1 mg/dL — ABNORMAL LOW (ref 1.7–2.4)

## 2022-04-09 LAB — PHOSPHORUS: Phosphorus: 3.8 mg/dL (ref 2.5–4.6)

## 2022-04-09 MED ORDER — SODIUM CHLORIDE 0.9 % IV SOLN: Freq: Once | INTRAVENOUS | Status: AC

## 2022-04-09 MED ORDER — SODIUM CHLORIDE 0.9% FLUSH
10.0000 mL | Freq: Once | INTRAVENOUS | Status: AC | PRN
  Administered 2022-04-09: 10 mL

## 2022-04-09 MED ORDER — HEPARIN SOD (PORK) LOCK FLUSH 100 UNIT/ML IV SOLN
500.0000 [IU] | Freq: Once | INTRAVENOUS | Status: AC | PRN
  Administered 2022-04-09: 500 [IU]

## 2022-04-09 MED ORDER — MAGNESIUM SULFATE 2 GM/50ML IV SOLN
2.0000 g | Freq: Once | INTRAVENOUS | Status: AC
  Administered 2022-04-09: 2 g via INTRAVENOUS
  Filled 2022-04-09: qty 50

## 2022-04-09 NOTE — Patient Instructions (Signed)
[facility]  Discharge Instructions: Thank you for choosing [facility] to provide your oncology and hematology care.   If you have a lab appointment with the [place], please go directly to the [facility] and check in at the registration area.   Wear comfortable clothing and clothing appropriate for easy access to any Portacath or PICC line.   We strive to give you quality time with your provider. You may need to reschedule your appointment if you arrive late (15 or more minutes).  Arriving late affects you and other patients whose appointments are after yours.  Also, if you miss three or more appointments without notifying the office, you may be dismissed from the clinic at the provider's discretion.      For prescription refill requests, have your pharmacy contact our office and allow 72 hours for refills to be completed.    Today you received the following chemotherapy and/or immunotherapy agent: Danny Wood       To help prevent nausea and vomiting after your treatment, we encourage you to take your nausea medication as directed.  BELOW ARE SYMPTOMS THAT SHOULD BE REPORTED IMMEDIATELY: *FEVER GREATER THAN 100.4 F (38 C) OR HIGHER *CHILLS OR SWEATING *NAUSEA AND VOMITING THAT IS NOT CONTROLLED WITH YOUR NAUSEA MEDICATION *UNUSUAL SHORTNESS OF BREATH *UNUSUAL BRUISING OR BLEEDING *URINARY PROBLEMS (pain or burning when urinating, or frequent urination) *BOWEL PROBLEMS (unusual diarrhea, constipation, pain near the anus) TENDERNESS IN MOUTH AND THROAT WITH OR WITHOUT PRESENCE OF ULCERS (sore throat, sores in mouth, or a toothache) UNUSUAL RASH, SWELLING OR PAIN  UNUSUAL VAGINAL DISCHARGE OR ITCHING   Items with * indicate a potential emergency and should be followed up as soon as possible or go to the Emergency Department if any problems should occur.  Please show the CHEMOTHERAPY ALERT CARD or IMMUNOTHERAPY ALERT CARD at check-in to  the Emergency Department and triage nurse.  Should you have questions after your visit or need to cancel or reschedule your appointment, please contact [facility]  Dept: [phone]  and follow the prompts.  Office hours are 8:00 a.m. to 4:30 p.m. Monday - Friday. Please note that voicemails left after 4:00 p.m. may not be returned until the following business day.  We are closed weekends and major holidays. You have access to a nurse at all times for urgent questions. Please call the main number to the clinic Dept: [phone] and follow the prompts.   For any non-urgent questions, you may also contact your provider using MyChart. We now offer e-Visits for anyone 3 and older to request care online for non-urgent symptoms. For details visit [email or web address].   Also download the MyChart app! Go to the app store, search "MyChart", open the app, select , and log in with your MyChart username and password.  Masks are optional in the cancer centers. If you would like for your care team to wear a mask while they are taking care of you, please let them know. You may have one support person who is at least 71 years old accompany you for your appointments.

## 2022-04-09 NOTE — Progress Notes (Signed)
Nutrition Follow-up:  Patient receiving [name] for urothelial carcinoma of bladder.   Met with patient during infusion. He reports appetite has been good, he is eating smaller portions more often. Patient reports altered taste has resolved. He is drinking [employer] supplement every few days. Diarrhea has resolved, last bowel movement was yesterday. Says he is having regular BMs every 2-3 days.   Medications: reviewed   Labs: glucose 160, Mg 1.1 (pt received 2gm/24m magnesium sulfate today)  Anthropometrics: Weight 169 lb today stable   8/1 - 169 lb 12.8 oz 7/18 - 166 lb 6.4 oz    NUTRITION DIAGNOSIS: Unintentional weight loss improving   INTERVENTION:  Continue strategies for increasing calories and protein with smaller more frequent meals  Pt agrees to drink one [employer]    MONITORING, EVALUATION, GOAL: weight trends, intake    NEXT VISIT: Wednesday September 13 during infusion

## 2022-04-11 ENCOUNTER — Other Ambulatory Visit: Payer: Self-pay

## 2022-04-14 ENCOUNTER — Other Ambulatory Visit: Payer: Self-pay | Admitting: Oncology

## 2022-04-14 DIAGNOSIS — C679 Malignant neoplasm of bladder, unspecified: Secondary | ICD-10-CM

## 2022-04-19 NOTE — Progress Notes (Unsigned)
[facility] OFFICE PROGRESS NOTE  [name], MD [place] [address]  DIAGNOSIS: 71 year old man with bladder cancer diagnosed in 2015.  He developed stage IV high-grade urothelial carcinoma with pelvic adenopathy diagnosed in 2019.    PRIOR THERAPY: He underwent a cystoscopy and a TURBT on 05/17/2014.    Neoadjuvant systemic chemotherapy in the form of gemcitabine and cisplatin cycle 1 day 1 is on 06/17/2014. He is S/P two cycles completed in 07/2014.  Therapy tolerated poorly with symptoms of nausea and vomiting and worsening renal function.   He is status post robotic cystoprostatectomy and bilateral lymphadenectomy completed on September 09, 2014.  The final pathology revealed T2N0 without any lymph node involvement.   He developed pelvic adenopathy that was biopsy-proven on Jan 14, 2018 to be metastatic urothelial carcinoma.   Carboplatin and gemcitabine cycle 1 started on 02/17/2018.  He completed 8 cycles of therapy in December 2019.   Pembrolizumab 200 mg every 3 weeks started on April 01, 2019.  He is status post 7 cycles of therapy in December 2020.  Therapy stopped because of of patient preference, excellent clinical response and treatment holiday.  He developed progression of disease in June 2021.   Padcev started on February 11, 2020.  He has been receiving monthly maintenance therapy up till March 2023.  He developed disease progression at that time.  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   Lynparza 300 mg twice a day started on December 26, 2021.  Therapy discontinued in July 2023 for progression of disease.  CURRENT THERAPY: sacituzumab govitecan started on 9 March 12, 2022.  He is here for day 1 cycle 3 of therapy.  INTERVAL HISTORY: Danny Wood 71 y.o. male returns to the clinic today for follow-up visit.  In July 2023 the patient had evidence of disease progression.  Therefore his treatment  was switched to [place].  Thus far he has been tolerating it fair.  Today he denies any fever, chills, or night sweats.  He has been following closely with the member the nutritionist team due to decreased p.o. intake and decreased appetite.  Medications?  His weight is ***today.  He denies any chest pain, shortness of breath, cough, or hemoptysis.  He continues to have some issues related to pain in the ***which is adequate pain control with his morphine and oxycodone.  Bladder habit changes?  Denies any abdominal pain or back pain.  Denies any nausea, vomiting, diarrhea, or constipation.  He is here today for evaluation and repeat blood work before starting cycle #3.     MEDICAL HISTORY: Past Medical History:  Diagnosis Date   At risk for sleep apnea    STOP-BANG= 4       SENT TO PCP 05-16-2014   bladder ca dx'd 05/17/14   met dz 12/2017   Bladder disorder    Hematuria    History of renal cell carcinoma    2003  S/P  PARTIAL RIGHT NEPHRECTOMY   Presence of surgical incision    lumbar diskectomy 05-11-2014-  bandage present   Urgency of urination     ALLERGIES:  has No Known Allergies.  MEDICATIONS:  Current Outpatient Medications  Medication Sig Dispense Refill   diphenoxylate-atropine (LOMOTIL) 2.5-0.025 MG tablet 1 to 2 PO QID prn diarrhea 45 tablet 2   furosemide (LASIX) 20 MG tablet Take 2 tablets (40 mg total) by mouth daily. 90 tablet 1  Insulin Glargine (BASAGLAR KWIKPEN) 100 UNIT/ML Inject 8 Units into the skin every morning. 9 mL 2   insulin lispro (HUMALOG KWIKPEN) 100 UNIT/ML KwikPen Inject 2 Units into the skin daily with supper. 1.8 mL 3   morphine (MS CONTIN) 30 MG 12 hr tablet Take 1 tablet (30 mg total) by mouth every 12 (twelve) hours. 60 tablet 0   olaparib (LYNPARZA) 150 MG tablet Take 2 tablets (300 mg total) by mouth 2 (two) times daily. Swallow whole. May take with food to decrease nausea and vomiting. 120 tablet 1   oxyCODONE-acetaminophen (PERCOCET) 10-325 MG  tablet Take 1 tablet by mouth every 4 (four) hours as needed for pain. 90 tablet 0   prochlorperazine (COMPAZINE) 10 MG tablet TAKE 1 TABLET(10 MG) BY MOUTH EVERY 6 HOURS AS NEEDED FOR NAUSEA OR VOMITING 30 tablet 0   sertraline (ZOLOFT) 25 MG tablet Take 1 tablet (25 mg total) by mouth at bedtime. 30 tablet 3   No current facility-administered medications for this visit.    SURGICAL HISTORY:  Past Surgical History:  Procedure Laterality Date   CYSTOSCOPY N/A 09/09/2014   Procedure: CYSTOSCOPY WITH INDOCYANINE GREEN DYE INJECTION AND URETHRAL DILATION;  Surgeon: Alexis Frock, MD;  Location: [facility];  Service: Urology;  Laterality: N/A;   CYSTOSCOPY WITH BIOPSY N/A 05/17/2014   Procedure: CYSTO WITH BLADDER BIOPSY;  Surgeon: Malka So, MD;  [facility];  Service: Urology;  Laterality: N/A;   EVALUATION UNDER ANESTHESIA WITH HEMORRHOIDECTOMY  07/07/2012   Procedure: EXAM UNDER ANESTHESIA WITH HEMORRHOIDECTOMY;  Surgeon: Joyice Faster. Cornett, MD;  Location: [facility];  Service: General;  Laterality: N/A;   IR IMAGING GUIDED PORT INSERTION  02/20/2018   LUMBAR MICRODISCECTOMY  05-11-2014   L4 -- L5 (partial)   PARTIAL NEPHRECTOMY Right 2003   ROBOT ASSISTED LAPAROSCOPIC COMPLETE CYSTECT ILEAL CONDUIT N/A 09/09/2014   Procedure: ROBOTIC ASSISTED LAPAROSCOPIC COMPLETE CYSTOPROSTATECTOMY,  ILEAL CONDUIT;  Surgeon: Alexis Frock, MD;  Location: [facility];  Service: Urology;  Laterality: N/A;    REVIEW OF SYSTEMS:   Review of Systems  Constitutional: Negative for appetite change, chills, fatigue, fever and unexpected weight change.  HENT:   Negative for mouth sores, nosebleeds, sore throat and trouble swallowing.   Eyes: Negative for eye problems and icterus.  Respiratory: Negative for cough, hemoptysis, shortness of breath and wheezing.   Cardiovascular: Negative for chest pain and leg swelling.  Gastrointestinal: Negative for abdominal pain, constipation, diarrhea, nausea and  vomiting.  Genitourinary: Negative for bladder incontinence, difficulty urinating, dysuria, frequency and hematuria.   Musculoskeletal: Negative for back pain, gait problem, neck pain and neck stiffness.  Skin: Negative for itching and rash.  Neurological: Negative for dizziness, extremity weakness, gait problem, headaches, light-headedness and seizures.  Hematological: Negative for adenopathy. Does not bruise/bleed easily.  Psychiatric/Behavioral: Negative for confusion, depression and sleep disturbance. The patient is not nervous/anxious.     PHYSICAL EXAMINATION:  There were no vitals taken for this visit.  ECOG PERFORMANCE STATUS: {CHL ONC ECOG [id number]  Physical Exam  Constitutional: Oriented to person, place, and time and well-developed, well-nourished, and in no distress. No distress.  HENT:  Head: Normocephalic and atraumatic.  Mouth/Throat: Oropharynx is clear and moist. No oropharyngeal exudate.  Eyes: Conjunctivae are normal. Right eye exhibits no discharge. Left eye exhibits no discharge. No scleral icterus.  Neck: Normal range of motion. Neck supple.  Cardiovascular: Normal rate, regular rhythm, normal heart sounds and intact distal pulses.   Pulmonary/Chest: Effort  normal and breath sounds normal. No respiratory distress. No wheezes. No rales.  Abdominal: Soft. Bowel sounds are normal. Exhibits no distension and no mass. There is no tenderness.  Musculoskeletal: Normal range of motion. Exhibits no edema.  Lymphadenopathy:    No cervical adenopathy.  Neurological: Alert and oriented to person, place, and time. Exhibits normal muscle tone. Gait normal. Coordination normal.  Skin: Skin is warm and dry. No rash noted. Not diaphoretic. No erythema. No pallor.  Psychiatric: Mood, memory and judgment normal.  Vitals reviewed.  LABORATORY DATA: Lab Results  Component Value Date   WBC 2.7 (L) 04/09/2022   HGB 11.3 (L) 04/09/2022   HCT 31.5 (L) 04/09/2022   MCV 84.9  04/09/2022   PLT 183 04/09/2022      Chemistry      Component Value Date/Time   NA 137 04/09/2022 1037   NA 139 08/05/2014 1335   K 3.9 04/09/2022 1037   K 4.3 08/05/2014 1335   CL 103 04/09/2022 1037   CO2 31 04/09/2022 1037   CO2 26 08/05/2014 1335   BUN 21 04/09/2022 1037   BUN 18.2 08/05/2014 1335   CREATININE 0.91 04/09/2022 1037   CREATININE 1.1 08/05/2014 1335      Component Value Date/Time   CALCIUM 8.2 (L) 04/09/2022 1037   CALCIUM 9.5 08/05/2014 1335   ALKPHOS 58 04/09/2022 1037   ALKPHOS 98 08/05/2014 1335   AST 20 04/09/2022 1037   AST 45 (H) 08/05/2014 1335   ALT 26 04/09/2022 1037   ALT 73 (H) 08/05/2014 1335   BILITOT 0.3 04/09/2022 1037   BILITOT 0.47 08/05/2014 1335       RADIOGRAPHIC STUDIES:  No results found.   ASSESSMENT/PLAN:  71 year old man with:       1.  Bladder cancer diagnosed in 2019.  He developed stage IV high-grade urothelial carcinoma with pelvic adenopathy.   He is currently on sacituzumab govitecan salvage treatment and completed the 2 cycles.  He is agreeable to proceed and the plan is to update his staging scans after cycle 4. Therefore, the patient is scheduled to see Dr. Alen Blew in 3 weeks, he will likely order these scans at this time. Labs were reviewed. Recommend that he *** today as scheduled.    2. Pain: Related to his malignancy and lymphadenopathy.  He is currently on morphine and oxycodone.  Pain is manageable.     3.  IV access: Port-A-Cath remains in place without any issues.   4.  Antiemetics: Compazine is available to him without any nausea or vomiting.   5.  GI complications: He has experienced diarrhea and mild dehydration.  We will replace with intravenous normal saline today.***   6.  Follow-up: In 3 weeks for the next cycle of therapy.     No orders of the defined types were placed in this encounter.    I spent {CHL ONC TIME VISIT - NWGNF:[id number]} counseling the patient face to face. The total  time spent in the appointment was {CHL ONC TIME VISIT - IONGE:[id number]}.  Ramin Zoll L Jasiah Elsen, PA-C 04/19/22

## 2022-04-23 MED FILL — Fosaprepitant Dimeglumine For IV Infusion 150 MG (Base Eq): INTRAVENOUS | Qty: 5 | Status: AC

## 2022-04-23 MED FILL — Dexamethasone Sodium Phosphate Inj 100 MG/10ML: INTRAMUSCULAR | Qty: 1 | Status: AC

## 2022-04-24 ENCOUNTER — Ambulatory Visit: Payer: Medicare Other

## 2022-04-24 ENCOUNTER — Inpatient Hospital Stay (HOSPITAL_BASED_OUTPATIENT_CLINIC_OR_DEPARTMENT_OTHER): Payer: Medicare Other | Admitting: Physician Assistant

## 2022-04-24 ENCOUNTER — Ambulatory Visit: Payer: Medicare Other | Admitting: Oncology

## 2022-04-24 ENCOUNTER — Other Ambulatory Visit: Payer: Self-pay

## 2022-04-24 ENCOUNTER — Inpatient Hospital Stay: Payer: Medicare Other

## 2022-04-24 ENCOUNTER — Other Ambulatory Visit: Payer: Medicare Other

## 2022-04-24 VITALS — BP 115/71 | HR 70 | Temp 97.6°F | Resp 15 | Wt 167.0 lb

## 2022-04-24 DIAGNOSIS — R21 Rash and other nonspecific skin eruption: Secondary | ICD-10-CM | POA: Diagnosis not present

## 2022-04-24 DIAGNOSIS — Z95828 Presence of other vascular implants and grafts: Secondary | ICD-10-CM

## 2022-04-24 DIAGNOSIS — C679 Malignant neoplasm of bladder, unspecified: Secondary | ICD-10-CM

## 2022-04-24 DIAGNOSIS — Z5112 Encounter for antineoplastic immunotherapy: Secondary | ICD-10-CM | POA: Diagnosis not present

## 2022-04-24 DIAGNOSIS — L304 Erythema intertrigo: Secondary | ICD-10-CM | POA: Diagnosis not present

## 2022-04-24 LAB — CBC WITH DIFFERENTIAL (CANCER CENTER ONLY)
Abs Immature Granulocytes: 0.02 10*3/uL (ref 0.00–0.07)
Basophils Absolute: 0 10*3/uL (ref 0.0–0.1)
Basophils Relative: 0 %
Eosinophils Absolute: 0 10*3/uL (ref 0.0–0.5)
Eosinophils Relative: 1 %
HCT: 36.4 % — ABNORMAL LOW (ref 39.0–52.0)
Hemoglobin: 13.3 g/dL (ref 13.0–17.0)
Immature Granulocytes: 0 %
Lymphocytes Relative: 26 %
Lymphs Abs: 1.4 10*3/uL (ref 0.7–4.0)
MCH: 31.1 pg (ref 26.0–34.0)
MCHC: 36.5 g/dL — ABNORMAL HIGH (ref 30.0–36.0)
MCV: 85 fL (ref 80.0–100.0)
Monocytes Absolute: 0.5 10*3/uL (ref 0.1–1.0)
Monocytes Relative: 9 %
Neutro Abs: 3.4 10*3/uL (ref 1.7–7.7)
Neutrophils Relative %: 64 %
Platelet Count: 204 10*3/uL (ref 150–400)
RBC: 4.28 MIL/uL (ref 4.22–5.81)
RDW: 13.1 % (ref 11.5–15.5)
WBC Count: 5.4 10*3/uL (ref 4.0–10.5)
nRBC: 0 % (ref 0.0–0.2)

## 2022-04-24 LAB — CMP (CANCER CENTER ONLY)
ALT: 27 U/L (ref 0–44)
AST: 25 U/L (ref 15–41)
Albumin: 3.6 g/dL (ref 3.5–5.0)
Alkaline Phosphatase: 60 U/L (ref 38–126)
Anion gap: 4 — ABNORMAL LOW (ref 5–15)
BUN: 17 mg/dL (ref 8–23)
CO2: 29 mmol/L (ref 22–32)
Calcium: 8.7 mg/dL — ABNORMAL LOW (ref 8.9–10.3)
Chloride: 105 mmol/L (ref 98–111)
Creatinine: 0.71 mg/dL (ref 0.61–1.24)
GFR, Estimated: >60 mL/min (ref >60)
Glucose, Bld: 159 mg/dL — ABNORMAL HIGH (ref 70–99)
Potassium: 4.1 mmol/L (ref 3.5–5.1)
Sodium: 138 mmol/L (ref 135–145)
Total Bilirubin: 0.4 mg/dL (ref 0.3–1.2)
Total Protein: 5.8 g/dL — ABNORMAL LOW (ref 6.5–8.1)

## 2022-04-24 LAB — PHOSPHORUS: Phosphorus: 2.9 mg/dL (ref 2.5–4.6)

## 2022-04-24 LAB — MAGNESIUM: Magnesium: 1.1 mg/dL — ABNORMAL LOW (ref 1.7–2.4)

## 2022-04-24 MED ORDER — ACETAMINOPHEN 325 MG PO TABS
650.0000 mg | ORAL_TABLET | Freq: Once | ORAL | Status: AC
  Administered 2022-04-24: 650 mg via ORAL
  Filled 2022-04-24: qty 2

## 2022-04-24 MED ORDER — CLOTRIMAZOLE 1 % EX LOTN: 1.0000 | TOPICAL_LOTION | Freq: Two times a day (BID) | CUTANEOUS | 0 refills | Status: DC | PRN

## 2022-04-24 MED ORDER — ATROPINE SULFATE 1 MG/ML IV SOLN
0.5000 mg | Freq: Once | INTRAVENOUS | Status: AC | PRN
  Administered 2022-04-24: 0.5 mg via INTRAVENOUS
  Filled 2022-04-24: qty 1

## 2022-04-24 MED ORDER — OXYCODONE-ACETAMINOPHEN 10-325 MG PO TABS: 1.0000 | ORAL_TABLET | ORAL | 0 refills | Status: DC | PRN

## 2022-04-24 MED ORDER — SODIUM CHLORIDE 0.9 % IV SOLN
10.0000 mg | Freq: Once | INTRAVENOUS | Status: AC
  Administered 2022-04-24: 10 mg via INTRAVENOUS
  Filled 2022-04-24: qty 10

## 2022-04-24 MED ORDER — SODIUM CHLORIDE 0.9 % IV SOLN: Freq: Once | INTRAVENOUS | Status: DC

## 2022-04-24 MED ORDER — FAMOTIDINE IN NACL 20-0.9 MG/50ML-% IV SOLN
20.0000 mg | Freq: Once | INTRAVENOUS | Status: AC
  Administered 2022-04-24: 20 mg via INTRAVENOUS
  Filled 2022-04-24: qty 50

## 2022-04-24 MED ORDER — SODIUM CHLORIDE 0.9% FLUSH
10.0000 mL | INTRAVENOUS | Status: DC | PRN
  Administered 2022-04-24: 10 mL

## 2022-04-24 MED ORDER — SODIUM CHLORIDE 0.9 % IV SOLN: Freq: Once | INTRAVENOUS | Status: AC

## 2022-04-24 MED ORDER — DIPHENHYDRAMINE HCL 50 MG/ML IJ SOLN
50.0000 mg | Freq: Once | INTRAMUSCULAR | Status: AC
  Administered 2022-04-24: 50 mg via INTRAVENOUS
  Filled 2022-04-24: qty 1

## 2022-04-24 MED ORDER — NYSTATIN 100000 UNIT/GM EX POWD: 1.0000 | Freq: Three times a day (TID) | CUTANEOUS | 0 refills | Status: DC

## 2022-04-24 MED ORDER — SODIUM CHLORIDE 0.9 % IV SOLN
9.5000 mg/kg | Freq: Once | INTRAVENOUS | Status: AC
  Administered 2022-04-24: 720 mg via INTRAVENOUS
  Filled 2022-04-24: qty 72

## 2022-04-24 MED ORDER — SODIUM CHLORIDE 0.9% FLUSH
10.0000 mL | Freq: Once | INTRAVENOUS | Status: AC | PRN
  Administered 2022-04-24: 10 mL

## 2022-04-24 MED ORDER — HEPARIN SOD (PORK) LOCK FLUSH 100 UNIT/ML IV SOLN
500.0000 [IU] | Freq: Once | INTRAVENOUS | Status: AC | PRN
  Administered 2022-04-24: 500 [IU]

## 2022-04-24 MED ORDER — PALONOSETRON HCL INJECTION 0.25 MG/5ML
0.2500 mg | Freq: Once | INTRAVENOUS | Status: AC
  Administered 2022-04-24: 0.25 mg via INTRAVENOUS
  Filled 2022-04-24: qty 5

## 2022-04-24 MED ORDER — MAGNESIUM SULFATE 2 GM/50ML IV SOLN
2.0000 g | Freq: Once | INTRAVENOUS | Status: AC
  Administered 2022-04-24: 2 g via INTRAVENOUS
  Filled 2022-04-24: qty 50

## 2022-04-24 MED ORDER — MAGNESIUM OXIDE -MG SUPPLEMENT 400 (240 MG) MG PO TABS: 400.0000 mg | ORAL_TABLET | Freq: Every day | ORAL | 1 refills | Status: DC

## 2022-04-24 MED ORDER — SODIUM CHLORIDE 0.9 % IV SOLN
150.0000 mg | Freq: Once | INTRAVENOUS | Status: AC
  Administered 2022-04-24: 150 mg via INTRAVENOUS
  Filled 2022-04-24: qty 150

## 2022-04-24 NOTE — Patient Instructions (Addendum)
[facility]  Discharge Instructions: Thank you for choosing [facility] to provide your oncology and hematology care.   If you have a lab appointment with the [facility], please go directly to the [facility] and check in at the registration area.   Wear comfortable clothing and clothing appropriate for easy access to any Portacath or PICC line.   We strive to give you quality time with your provider. You may need to reschedule your appointment if you arrive late (15 or more minutes).  Arriving late affects you and other patients whose appointments are after yours.  Also, if you miss three or more appointments without notifying the office, you may be dismissed from the clinic at the provider's discretion.      For prescription refill requests, have your pharmacy contact our office and allow 72 hours for refills to be completed.    Today you received the following chemotherapy and/or immunotherapy agents: Danny Wood      To help prevent nausea and vomiting after your treatment, we encourage you to take your nausea medication as directed.  BELOW ARE SYMPTOMS THAT SHOULD BE REPORTED IMMEDIATELY: *FEVER GREATER THAN 100.4 F (38 C) OR HIGHER *CHILLS OR SWEATING *NAUSEA AND VOMITING THAT IS NOT CONTROLLED WITH YOUR NAUSEA MEDICATION *UNUSUAL SHORTNESS OF BREATH *UNUSUAL BRUISING OR BLEEDING *URINARY PROBLEMS (pain or burning when urinating, or frequent urination) *BOWEL PROBLEMS (unusual diarrhea, constipation, pain near the anus) TENDERNESS IN MOUTH AND THROAT WITH OR WITHOUT PRESENCE OF ULCERS (sore throat, sores in mouth, or a toothache) UNUSUAL RASH, SWELLING OR PAIN  UNUSUAL VAGINAL DISCHARGE OR ITCHING   Items with * indicate a potential emergency and should be followed up as soon as possible or go to the Emergency Department if any problems should occur.  Please show the CHEMOTHERAPY ALERT CARD or IMMUNOTHERAPY ALERT CARD at check-in to  the Emergency Department and triage nurse.  Should you have questions after your visit or need to cancel or reschedule your appointment, please contact [facility]  Dept: [phone]  and follow the prompts.  Office hours are 8:00 a.m. to 4:30 p.m. Monday - Friday. Please note that voicemails left after 4:00 p.m. may not be returned until the following business day.  We are closed weekends and major holidays. You have access to a nurse at all times for urgent questions. Please call the main number to the clinic Dept: [phone] and follow the prompts.   For any non-urgent questions, you may also contact your provider using MyChart. We now offer e-Visits for anyone 21 and older to request care online for non-urgent symptoms. For details visit [email or web address].   Also download the MyChart app! Go to the app store, search "MyChart", open the app, select , and log in with your MyChart username and password.  Masks are optional in the cancer centers. If you would like for your care team to wear a mask while they are taking care of you, please let them know. You may have one support person who is at least 71 years old accompany you for your appointments.  Magnesium Sulfate Injection What is this medication? MAGNESIUM SULFATE (mag NEE zee um SUL fate) prevents and treats low levels of magnesium in your body. It may also be used to prevent and treat seizures during pregnancy in people with high blood pressure disorders, such as preeclampsia or eclampsia. Magnesium plays an important role in maintaining the health of your muscles  and nervous system. This medicine may be used for other purposes; ask your health care provider or pharmacist if you have questions. What should I tell my care team before I take this medication? They need to know if you have any of these conditions: Heart disease History of irregular heart beat Kidney disease An unusual or  allergic reaction to magnesium sulfate, medications, foods, dyes, or preservatives Pregnant or trying to get pregnant Breast-feeding How should I use this medication? This medication is for infusion into a vein. It is given in a hospital or clinic setting. Talk to your care team about the use of this medication in children. While this medication may be prescribed for selected conditions, precautions do apply. Overdosage: If you think you have taken too much of this medicine contact a poison control center or emergency room at once. NOTE: This medicine is only for you. Do not share this medicine with others. What if I miss a dose? This does not apply. What may interact with this medication? Certain medications for anxiety or sleep Certain medications for seizures like phenobarbital Digoxin Medications that relax muscles for surgery Narcotic medications for pain This list may not describe all possible interactions. Give your health care provider a list of all the medicines, herbs, non-prescription drugs, or dietary supplements you use. Also tell them if you smoke, drink alcohol, or use illegal drugs. Some items may interact with your medicine. What should I watch for while using this medication? Your condition will be monitored carefully while you are receiving this medication. You may need blood work done while you are receiving this medication. What side effects may I notice from receiving this medication? Side effects that you should report to your care team as soon as possible: Allergic reactions--skin rash, itching, hives, swelling of the face, lips, tongue, or throat High magnesium level--confusion, drowsiness, facial flushing, redness, sweating, muscle weakness, fast or irregular heartbeat, trouble breathing Low blood pressure--dizziness, feeling faint or lightheaded, blurry vision Side effects that usually do not require medical attention (report to your care team if they continue or  are bothersome): Headache Nausea This list may not describe all possible side effects. Call your doctor for medical advice about side effects. You may report side effects to FDA at 1-800-FDA-1088. Where should I keep my medication? This medication is given in a hospital or clinic and will not be stored at home. NOTE: This sheet is a summary. It may not cover all possible information. If you have questions about this medicine, talk to your doctor, pharmacist, or health care provider.  2023 Elsevier/Gold Standard (2020-10-26 00:00:00)

## 2022-04-24 NOTE — Progress Notes (Signed)
Per Cassie Heilingoetter, PA, ok to proceed with treatment with pending phosphorus lab.

## 2022-04-25 ENCOUNTER — Other Ambulatory Visit: Payer: Self-pay

## 2022-04-26 ENCOUNTER — Telehealth: Payer: Self-pay | Admitting: Oncology

## 2022-04-26 NOTE — Telephone Encounter (Signed)
Scheduled per workqueue, patient has been called and notified of upcoming appointments.

## 2022-04-27 ENCOUNTER — Other Ambulatory Visit: Payer: Self-pay

## 2022-04-29 ENCOUNTER — Telehealth: Payer: Self-pay

## 2022-04-29 ENCOUNTER — Telehealth: Payer: Self-pay | Admitting: Oncology

## 2022-04-29 ENCOUNTER — Other Ambulatory Visit: Payer: Self-pay

## 2022-04-29 NOTE — Telephone Encounter (Signed)
Per Scheduling: This patient just called in about cancelling his port flush/lab and infusion appointment for this Wednesday. Patient states he's not doing well. He said he's not feeling good, has bad diarrhea, falling and hasn't recovered very well (I assume from his last treatment). I just wanted to make you aware. Do you mind calling and following up with the patient?  Spoke with pt and he said he is not eating, sleeping and feels like he just needs to take a week off of treatment.   Pt advised to go to the ED for evaluation if symptoms do not resolve. He was in agreement with this plan

## 2022-04-29 NOTE — Telephone Encounter (Signed)
Cancelled appointment per patients request via telephone due to not feeling well. The appointments for Wednesday 8/30 were cancelled and the desk nurse Bonnita Nasuti was notified of the patients condition.

## 2022-05-01 ENCOUNTER — Ambulatory Visit: Payer: Medicare Other

## 2022-05-01 ENCOUNTER — Other Ambulatory Visit: Payer: Medicare Other

## 2022-05-08 ENCOUNTER — Telehealth: Payer: Self-pay

## 2022-05-08 NOTE — Patient Outreach (Signed)
  Care Coordination   Initial Visit Note   05/08/2022 Name: Danny Wood MRN: [id number] DOB: [birth year]  Danny Wood is a 70 y.o. year old male who sees [name], MD for primary care. I spoke with  Danny Wood by phone today.  What matters to the patients health and wellness today?  None, advised on AWV    Goals Addressed             This Visit's Progress    COMPLETED: Care Coordniation Activities-No follow up required       Care Coordination Interventions: Advised patient to Complete AWV           SDOH assessments and interventions completed:  Yes  SDOH Interventions Today    Flowsheet Row Most Recent Value  SDOH Interventions   Housing Interventions Intervention Not Indicated        Care Coordination Interventions Activated:  Yes  Care Coordination Interventions:  Yes, provided   Follow up plan: No further intervention required.   Encounter Outcome:  Pt. Visit Completed   Jone Baseman, RN, MSN [facility] Care Management Coordinator Direct Line [phone] Toll Free: [phone]  Fax: [phone]

## 2022-05-10 ENCOUNTER — Other Ambulatory Visit: Payer: Self-pay

## 2022-05-14 MED FILL — Fosaprepitant Dimeglumine For IV Infusion 150 MG (Base Eq): INTRAVENOUS | Qty: 5 | Status: AC

## 2022-05-14 MED FILL — Dexamethasone Sodium Phosphate Inj 100 MG/10ML: INTRAMUSCULAR | Qty: 1 | Status: AC

## 2022-05-15 ENCOUNTER — Inpatient Hospital Stay: Payer: Medicare Other

## 2022-05-15 ENCOUNTER — Other Ambulatory Visit: Payer: Self-pay

## 2022-05-15 ENCOUNTER — Inpatient Hospital Stay: Payer: Medicare Other | Attending: Oncology

## 2022-05-15 ENCOUNTER — Inpatient Hospital Stay (HOSPITAL_BASED_OUTPATIENT_CLINIC_OR_DEPARTMENT_OTHER): Payer: Medicare Other | Admitting: Oncology

## 2022-05-15 ENCOUNTER — Inpatient Hospital Stay: Payer: Medicare Other | Admitting: Dietician

## 2022-05-15 ENCOUNTER — Ambulatory Visit: Payer: Medicare Other

## 2022-05-15 VITALS — BP 124/65 | HR 64 | Temp 98.5°F | Resp 17

## 2022-05-15 DIAGNOSIS — C679 Malignant neoplasm of bladder, unspecified: Secondary | ICD-10-CM

## 2022-05-15 DIAGNOSIS — Z95828 Presence of other vascular implants and grafts: Secondary | ICD-10-CM

## 2022-05-15 DIAGNOSIS — R197 Diarrhea, unspecified: Secondary | ICD-10-CM | POA: Insufficient documentation

## 2022-05-15 DIAGNOSIS — R112 Nausea with vomiting, unspecified: Secondary | ICD-10-CM | POA: Diagnosis not present

## 2022-05-15 DIAGNOSIS — R590 Localized enlarged lymph nodes: Secondary | ICD-10-CM | POA: Diagnosis not present

## 2022-05-15 DIAGNOSIS — Z5112 Encounter for antineoplastic immunotherapy: Secondary | ICD-10-CM | POA: Insufficient documentation

## 2022-05-15 DIAGNOSIS — Z79899 Other long term (current) drug therapy: Secondary | ICD-10-CM | POA: Diagnosis not present

## 2022-05-15 LAB — CBC WITH DIFFERENTIAL (CANCER CENTER ONLY)
Abs Immature Granulocytes: 0.02 10*3/uL (ref 0.00–0.07)
Basophils Absolute: 0 10*3/uL (ref 0.0–0.1)
Basophils Relative: 0 %
Eosinophils Absolute: 0.1 10*3/uL (ref 0.0–0.5)
Eosinophils Relative: 1 %
HCT: 36.1 % — ABNORMAL LOW (ref 39.0–52.0)
Hemoglobin: 12.9 g/dL — ABNORMAL LOW (ref 13.0–17.0)
Immature Granulocytes: 0 %
Lymphocytes Relative: 30 %
Lymphs Abs: 1.5 10*3/uL (ref 0.7–4.0)
MCH: 31.1 pg (ref 26.0–34.0)
MCHC: 35.7 g/dL (ref 30.0–36.0)
MCV: 87 fL (ref 80.0–100.0)
Monocytes Absolute: 0.6 10*3/uL (ref 0.1–1.0)
Monocytes Relative: 13 %
Neutro Abs: 2.9 10*3/uL (ref 1.7–7.7)
Neutrophils Relative %: 56 %
Platelet Count: 196 10*3/uL (ref 150–400)
RBC: 4.15 MIL/uL — ABNORMAL LOW (ref 4.22–5.81)
RDW: 13.4 % (ref 11.5–15.5)
WBC Count: 5.1 10*3/uL (ref 4.0–10.5)
nRBC: 0 % (ref 0.0–0.2)

## 2022-05-15 LAB — CMP (CANCER CENTER ONLY)
ALT: 25 U/L (ref 0–44)
AST: 24 U/L (ref 15–41)
Albumin: 3.7 g/dL (ref 3.5–5.0)
Alkaline Phosphatase: 56 U/L (ref 38–126)
Anion gap: 1 — ABNORMAL LOW (ref 5–15)
BUN: 24 mg/dL — ABNORMAL HIGH (ref 8–23)
CO2: 31 mmol/L (ref 22–32)
Calcium: 8.9 mg/dL (ref 8.9–10.3)
Chloride: 105 mmol/L (ref 98–111)
Creatinine: 0.95 mg/dL (ref 0.61–1.24)
GFR, Estimated: >60 mL/min (ref >60)
Glucose, Bld: 165 mg/dL — ABNORMAL HIGH (ref 70–99)
Potassium: 4.4 mmol/L (ref 3.5–5.1)
Sodium: 137 mmol/L (ref 135–145)
Total Bilirubin: 0.4 mg/dL (ref 0.3–1.2)
Total Protein: 5.9 g/dL — ABNORMAL LOW (ref 6.5–8.1)

## 2022-05-15 LAB — MAGNESIUM: Magnesium: 1.3 mg/dL — ABNORMAL LOW (ref 1.7–2.4)

## 2022-05-15 LAB — PHOSPHORUS: Phosphorus: 3.8 mg/dL (ref 2.5–4.6)

## 2022-05-15 MED ORDER — HEPARIN SOD (PORK) LOCK FLUSH 100 UNIT/ML IV SOLN
500.0000 [IU] | Freq: Once | INTRAVENOUS | Status: AC | PRN
  Administered 2022-05-15: 500 [IU]

## 2022-05-15 MED ORDER — ATROPINE SULFATE 1 MG/ML IV SOLN
0.5000 mg | Freq: Once | INTRAVENOUS | Status: AC | PRN
  Administered 2022-05-15: 0.5 mg via INTRAVENOUS
  Filled 2022-05-15: qty 1

## 2022-05-15 MED ORDER — SODIUM CHLORIDE 0.9% FLUSH
10.0000 mL | INTRAVENOUS | Status: DC | PRN
  Administered 2022-05-15: 10 mL

## 2022-05-15 MED ORDER — DIPHENHYDRAMINE HCL 50 MG/ML IJ SOLN
50.0000 mg | Freq: Once | INTRAMUSCULAR | Status: AC
  Administered 2022-05-15: 50 mg via INTRAVENOUS
  Filled 2022-05-15: qty 1

## 2022-05-15 MED ORDER — MORPHINE SULFATE ER 30 MG PO TBCR: 30.0000 mg | EXTENDED_RELEASE_TABLET | Freq: Two times a day (BID) | ORAL | 0 refills | Status: DC

## 2022-05-15 MED ORDER — SODIUM CHLORIDE 0.9 % IV SOLN: Freq: Once | INTRAVENOUS | Status: AC

## 2022-05-15 MED ORDER — PALONOSETRON HCL INJECTION 0.25 MG/5ML
0.2500 mg | Freq: Once | INTRAVENOUS | Status: AC
  Administered 2022-05-15: 0.25 mg via INTRAVENOUS
  Filled 2022-05-15: qty 5

## 2022-05-15 MED ORDER — SODIUM CHLORIDE 0.9 % IV SOLN
10.0000 mg | Freq: Once | INTRAVENOUS | Status: AC
  Administered 2022-05-15: 10 mg via INTRAVENOUS
  Filled 2022-05-15: qty 10

## 2022-05-15 MED ORDER — SODIUM CHLORIDE 0.9 % IV SOLN
9.5000 mg/kg | Freq: Once | INTRAVENOUS | Status: AC
  Administered 2022-05-15: 720 mg via INTRAVENOUS
  Filled 2022-05-15: qty 72

## 2022-05-15 MED ORDER — SODIUM CHLORIDE 0.9 % IV SOLN
150.0000 mg | Freq: Once | INTRAVENOUS | Status: AC
  Administered 2022-05-15: 150 mg via INTRAVENOUS
  Filled 2022-05-15: qty 150

## 2022-05-15 MED ORDER — OXYCODONE-ACETAMINOPHEN 10-325 MG PO TABS: 1.0000 | ORAL_TABLET | ORAL | 0 refills | Status: DC | PRN

## 2022-05-15 MED ORDER — FAMOTIDINE IN NACL 20-0.9 MG/50ML-% IV SOLN
20.0000 mg | Freq: Once | INTRAVENOUS | Status: AC
  Administered 2022-05-15: 20 mg via INTRAVENOUS
  Filled 2022-05-15: qty 50

## 2022-05-15 MED ORDER — ACETAMINOPHEN 325 MG PO TABS
650.0000 mg | ORAL_TABLET | Freq: Once | ORAL | Status: AC
  Administered 2022-05-15: 650 mg via ORAL
  Filled 2022-05-15: qty 2

## 2022-05-15 NOTE — Progress Notes (Signed)
Per Dr Alen Blew, ok to proceed with mag 1.3 mg/dL and pending phos level.

## 2022-05-15 NOTE — Progress Notes (Signed)
Pt declined to stay for post 30 obs, ambulatory to lobby with VSS upon discharge 

## 2022-05-15 NOTE — Addendum Note (Signed)
Addended by: Wyatt Portela on: 05/15/2022 01:33 PM   Modules accepted: Orders

## 2022-05-15 NOTE — Progress Notes (Signed)
Hematology and Oncology Follow Up  Danny Wood [id number] 05-10-1951 71 y.o. 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   He initially presented in 2015 with localized disease.   Prior Therapy:  He underwent a cystoscopy and a TURBT on 05/17/2014.    Neoadjuvant systemic chemotherapy in the form of gemcitabine and cisplatin cycle 1 day 1 is on 06/17/2014. He is S/P two cycles completed in 07/2014.  Therapy tolerated poorly with symptoms of nausea and vomiting and worsening renal function.   He is status post robotic cystoprostatectomy and bilateral lymphadenectomy completed on September 09, 2014.  The final pathology revealed T2N0 without any lymph node involvement.   He developed pelvic adenopathy that was biopsy-proven on Jan 14, 2018 to be metastatic urothelial carcinoma.   Carboplatin and gemcitabine cycle 1 started on 02/17/2018.  He completed 8 cycles of therapy in December 2019.   Pembrolizumab 200 mg every 3 weeks started on April 01, 2019.  He is status post 7 cycles of therapy in December 2020.  Therapy stopped because of of patient preference, excellent clinical response and treatment holiday.  He developed progression of disease in June 2021.   Padcev started on February 11, 2020.  He has been receiving monthly maintenance therapy up till March 2023.  He developed disease progression at that time.  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  Lynparza 300 mg twice a day started on December 26, 2021.  Therapy discontinued in July 2023 for progression of disease.       Current therapy:  Sacituzumab govitecan started on 9 March 12, 2022.  He is here for day 1 cycle 4 of therapy.          Interim History: Danny Wood returns today for a follow-up visit.  Since last visit, he reports a few  complaints related to therapy but overall manageable.  He has reported loose bowel habits but averages about once a day.  He has explosive loose bowel movement in the morning but no other after that.  He denies any nausea, vomiting or abdominal pain.  He denies any worsening neuropathy.  Pain is manageable with morphine and Percocet.  He is eating better and maintaining his weight at this time.   Medications: Reviewed without changes. Current Outpatient Medications  Medication Sig Dispense Refill   Clotrimazole 1 % LOTN Apply 1 Application topically 2 (two) times daily as needed. 30 mL 0   diphenoxylate-atropine (LOMOTIL) 2.5-0.025 MG tablet 1 to 2 PO QID prn diarrhea (Patient not taking: Reported on 04/24/2022) 45 tablet 2   furosemide (LASIX) 20 MG tablet Take 2 tablets (40 mg total) by mouth daily. (Patient not taking: Reported on 04/24/2022) 90 tablet 1   magnesium oxide (MAG-OX) 400 (240 Mg) MG tablet Take 1 tablet (400 mg total) by mouth daily. 30 tablet 1   morphine (MS CONTIN) 30 MG 12 hr tablet Take 1 tablet (30 mg total) by mouth every 12 (twelve) hours. 60 tablet 0   nystatin (MYCOSTATIN/NYSTOP) powder Apply 1 Application topically 3 (three) times daily. 30 g 0   oxyCODONE-acetaminophen (PERCOCET) 10-325 MG tablet Take 1 tablet by mouth every 4 (four) hours as needed for pain. 90 tablet 0   No current facility-administered medications for this visit.   Physical exam Blood pressure 119/70, pulse 71, temperature 98  F (36.7 C), temperature source Temporal, resp. rate 17, height 6' 2"  (1.88 m), weight 167 lb (75.8 kg), SpO2 100 %.   ECOG 1  General appearance: Alert, awake without any distress. Head: Atraumatic without abnormalities Oropharynx: Without any thrush or ulcers. Eyes: No scleral icterus. Lymph nodes: No lymphadenopathy noted in the cervical, supraclavicular, or axillary nodes Heart:regular rate and rhythm, without any murmurs or gallops.   Lung: Clear to auscultation  without any rhonchi, wheezes or dullness to percussion. Abdomin: Soft, nontender without any shifting dullness or ascites. Musculoskeletal: No clubbing or cyanosis. Neurological: No motor or sensory deficits. Skin: Mild erythema noted around his urostomy bag.     Lab Results: Lab Results  Component Value Date   WBC 5.4 04/24/2022   HGB 13.3 04/24/2022   HCT 36.4 (L) 04/24/2022   MCV 85.0 04/24/2022   PLT 204 04/24/2022     Chemistry      Component Value Date/Time   NA 138 04/24/2022 0914   NA 139 08/05/2014 1335   K 4.1 04/24/2022 0914   K 4.3 08/05/2014 1335   CL 105 04/24/2022 0914   CO2 29 04/24/2022 0914   CO2 26 08/05/2014 1335   BUN 17 04/24/2022 0914   BUN 18.2 08/05/2014 1335   CREATININE 0.71 04/24/2022 0914   CREATININE 1.1 08/05/2014 1335      Component Value Date/Time   CALCIUM 8.7 (L) 04/24/2022 0914   CALCIUM 9.5 08/05/2014 1335   ALKPHOS 60 04/24/2022 0914   ALKPHOS 98 08/05/2014 1335   AST 25 04/24/2022 0914   AST 45 (H) 08/05/2014 1335   ALT 27 04/24/2022 0914   ALT 73 (H) 08/05/2014 1335   BILITOT 0.4 04/24/2022 0914   BILITOT 0.47 08/05/2014 1335         Impression and Plan:  71 year old man with:       1.  Stage IV high-grade urothelial carcinoma of the bladder with pelvic adenopathy diagnosed in 2019.   The natural course of his disease was reviewed at this time and treatment options were discussed.  He is currently receiving palliative therapy with [name] with a few complaints.  Risks and benefits of continuing this treatment were discussed at this time.  Other complications including nausea, fatigue, neuropathy and GI toxicity were reiterated.  Alternative treatment options including supportive care and transitioning to hospice.  At this time, his performance status remains adequate and wants to continue with therapy.  He is agreeable to complete current cycle of therapy and update his staging scan before the next visit.  2. Pain:  He remains on morphine and oxycodone and pain is manageable.  I will refill for him at this time.   3.  IV access: Port-A-Cath will continue to be in use without any issues.  4.  Antiemetics: No nausea or vomiting reported at this time.  Compazine is available to him.  5.  GI complications: He has reported occasional diarrhea for which she uses Imodium 4.  6.  Follow-up: In 1 week to complete the current cycle of therapy in 3 weeks for the next cycle of treatment.   30  minutes were spent on this encounter.  The time was dedicated to reviewing laboratory data and the status update, treatment choices and future plan of care discussion.  [name], MD 05/15/2022 12:31 PM

## 2022-05-15 NOTE — Progress Notes (Signed)
Nutrition Follow-up:  Patient with bladder cancer. Lynparza discontinued July 2023 due to progression. He is currently receiving Sacituzumab q21d.   Met with patient during infusion. He reports having increased energy level. Patient has been working out his yard and enjoys this. He denies nausea, vomiting, constipation. Patient endorses significant single episode of diarrhea every morning. He is taking imodium plus kaopectate at bedtime. Patient has been working to increase oral intake. He wants to gain some of his weight back. Yesterday he ate cream of wheat and english muffin with butter for breakfast, 3-5 oatmeal cookies with large glass of milk for snack, egg salad sandwich for lunch, meatloaf, potato salad, carrots for supper, Haggen-Daz shake at bedtime. He drinks these nightly as well as 2 Ensure in between meals. Patient endorses drinking lots of water. He likes this ice cold.    Medications: Mag-ox, percocet  Labs: glucose 165, BUN 24, Mg 1.3  Anthropometrics: Weight 167 lb today stable x 3 weeks  8/23 - 167 lb  8/1 - 169 lb 6.4 oz 7/11 - 170 lb 8 oz  6/22 - 175 lb 9.6 oz    NUTRITION DIAGNOSIS: Unintentional weight loss stable    INTERVENTION:  Continue strategies for increasing calories and protein with smaller more frequent meals Reviewed foods with protein, suggestions on sources of protein to include at meal/snack time Suggested switching to fairlife milk (lactose free, 13 g protein) as well as lactose free ice cream to assess for possible intolerance  Continue activity as able     MONITORING, EVALUATION, GOAL: weight trends, intake   NEXT VISIT: Wednesday October 4 during infusion

## 2022-05-15 NOTE — Progress Notes (Signed)
Pt states he tolerates treatment better when he receives IVF along with it, ok for IVF with treatment today per Dr Alen Blew

## 2022-05-15 NOTE — Patient Instructions (Signed)
[facility]  Discharge Instructions: Thank you for choosing [facility] to provide your oncology and hematology care.   If you have a lab appointment with the Cancer Center, please go directly to the Cancer Center and check in at the registration area.   Wear comfortable clothing and clothing appropriate for easy access to any Portacath or PICC line.   We strive to give you quality time with your provider. You may need to reschedule your appointment if you arrive late (15 or more minutes).  Arriving late affects you and other patients whose appointments are after yours.  Also, if you miss three or more appointments without notifying the office, you may be dismissed from the clinic at the provider's discretion.      For prescription refill requests, have your pharmacy contact our office and allow 72 hours for refills to be completed.    Today you received the following chemotherapy and/or immunotherapy agents: Trodelvy      To help prevent nausea and vomiting after your treatment, we encourage you to take your nausea medication as directed.  BELOW ARE SYMPTOMS THAT SHOULD BE REPORTED IMMEDIATELY: *FEVER GREATER THAN 100.4 F (38 C) OR HIGHER *CHILLS OR SWEATING *NAUSEA AND VOMITING THAT IS NOT CONTROLLED WITH YOUR NAUSEA MEDICATION *UNUSUAL SHORTNESS OF BREATH *UNUSUAL BRUISING OR BLEEDING *URINARY PROBLEMS (pain or burning when urinating, or frequent urination) *BOWEL PROBLEMS (unusual diarrhea, constipation, pain near the anus) TENDERNESS IN MOUTH AND THROAT WITH OR WITHOUT PRESENCE OF ULCERS (sore throat, sores in mouth, or a toothache) UNUSUAL RASH, SWELLING OR PAIN  UNUSUAL VAGINAL DISCHARGE OR ITCHING   Items with * indicate a potential emergency and should be followed up as soon as possible or go to the Emergency Department if any problems should occur.  Please show the CHEMOTHERAPY ALERT CARD or IMMUNOTHERAPY ALERT CARD at check-in to  the Emergency Department and triage nurse.  Should you have questions after your visit or need to cancel or reschedule your appointment, please contact [facility]  Dept: [phone]  and follow the prompts.  Office hours are 8:00 a.m. to 4:30 p.m. Monday - Friday. Please note that voicemails left after 4:00 p.m. may not be returned until the following business day.  We are closed weekends and major holidays. You have access to a nurse at all times for urgent questions. Please call the main number to the clinic Dept: [phone] and follow the prompts.   For any non-urgent questions, you may also contact your provider using MyChart. We now offer e-Visits for anyone 18 and older to request care online for non-urgent symptoms. For details visit [email or web address].   Also download the MyChart app! Go to the app store, search "MyChart", open the app, select [facility], and log in with your MyChart username and password.  Masks are optional in the cancer centers. If you would like for your care team to wear a mask while they are taking care of you, please let them know. You may have one support person who is at least 71 years old accompany you for your appointments. 

## 2022-05-16 ENCOUNTER — Telehealth: Payer: Self-pay | Admitting: *Deleted

## 2022-05-16 ENCOUNTER — Other Ambulatory Visit: Payer: Self-pay

## 2022-05-16 NOTE — Telephone Encounter (Signed)
PC to patient, no answer, left VM - informed him no PA is required for his CT which has been ordered & he may call Central Scheduling to schedule this at any time, [phone].

## 2022-05-21 MED FILL — Dexamethasone Sodium Phosphate Inj 100 MG/10ML: INTRAMUSCULAR | Qty: 1 | Status: AC

## 2022-05-21 MED FILL — Fosaprepitant Dimeglumine For IV Infusion 150 MG (Base Eq): INTRAVENOUS | Qty: 5 | Status: AC

## 2022-05-22 ENCOUNTER — Inpatient Hospital Stay: Payer: Medicare Other

## 2022-05-22 VITALS — BP 107/64 | HR 65 | Temp 97.7°F | Resp 18 | Wt 167.0 lb

## 2022-05-22 DIAGNOSIS — C679 Malignant neoplasm of bladder, unspecified: Secondary | ICD-10-CM

## 2022-05-22 DIAGNOSIS — Z95828 Presence of other vascular implants and grafts: Secondary | ICD-10-CM

## 2022-05-22 DIAGNOSIS — Z5112 Encounter for antineoplastic immunotherapy: Secondary | ICD-10-CM | POA: Diagnosis not present

## 2022-05-22 LAB — CBC WITH DIFFERENTIAL (CANCER CENTER ONLY)
Abs Immature Granulocytes: 0.07 10*3/uL (ref 0.00–0.07)
Basophils Absolute: 0 10*3/uL (ref 0.0–0.1)
Basophils Relative: 1 %
Eosinophils Absolute: 0.1 10*3/uL (ref 0.0–0.5)
Eosinophils Relative: 2 %
HCT: 36.1 % — ABNORMAL LOW (ref 39.0–52.0)
Hemoglobin: 12.8 g/dL — ABNORMAL LOW (ref 13.0–17.0)
Immature Granulocytes: 1 %
Lymphocytes Relative: 27 %
Lymphs Abs: 1.5 10*3/uL (ref 0.7–4.0)
MCH: 31 pg (ref 26.0–34.0)
MCHC: 35.5 g/dL (ref 30.0–36.0)
MCV: 87.4 fL (ref 80.0–100.0)
Monocytes Absolute: 0.4 10*3/uL (ref 0.1–1.0)
Monocytes Relative: 7 %
Neutro Abs: 3.4 10*3/uL (ref 1.7–7.7)
Neutrophils Relative %: 62 %
Platelet Count: 230 10*3/uL (ref 150–400)
RBC: 4.13 MIL/uL — ABNORMAL LOW (ref 4.22–5.81)
RDW: 12.9 % (ref 11.5–15.5)
WBC Count: 5.5 10*3/uL (ref 4.0–10.5)
nRBC: 0 % (ref 0.0–0.2)

## 2022-05-22 LAB — CMP (CANCER CENTER ONLY)
ALT: 32 U/L (ref 0–44)
AST: 26 U/L (ref 15–41)
Albumin: 3.7 g/dL (ref 3.5–5.0)
Alkaline Phosphatase: 65 U/L (ref 38–126)
Anion gap: 5 (ref 5–15)
BUN: 20 mg/dL (ref 8–23)
CO2: 31 mmol/L (ref 22–32)
Calcium: 8.8 mg/dL — ABNORMAL LOW (ref 8.9–10.3)
Chloride: 102 mmol/L (ref 98–111)
Creatinine: 0.89 mg/dL (ref 0.61–1.24)
GFR, Estimated: >60 mL/min (ref >60)
Glucose, Bld: 201 mg/dL — ABNORMAL HIGH (ref 70–99)
Potassium: 3.7 mmol/L (ref 3.5–5.1)
Sodium: 138 mmol/L (ref 135–145)
Total Bilirubin: 0.3 mg/dL (ref 0.3–1.2)
Total Protein: 6 g/dL — ABNORMAL LOW (ref 6.5–8.1)

## 2022-05-22 LAB — MAGNESIUM: Magnesium: 1.1 mg/dL — ABNORMAL LOW (ref 1.7–2.4)

## 2022-05-22 LAB — PHOSPHORUS: Phosphorus: 3.9 mg/dL (ref 2.5–4.6)

## 2022-05-22 MED ORDER — SODIUM CHLORIDE 0.9% FLUSH
10.0000 mL | Freq: Once | INTRAVENOUS | Status: AC
  Administered 2022-05-22: 10 mL

## 2022-05-22 MED ORDER — SODIUM CHLORIDE 0.9 % IV SOLN
150.0000 mg | Freq: Once | INTRAVENOUS | Status: AC
  Administered 2022-05-22: 150 mg via INTRAVENOUS
  Filled 2022-05-22: qty 150

## 2022-05-22 MED ORDER — PALONOSETRON HCL INJECTION 0.25 MG/5ML
0.2500 mg | Freq: Once | INTRAVENOUS | Status: AC
  Administered 2022-05-22: 0.25 mg via INTRAVENOUS
  Filled 2022-05-22: qty 5

## 2022-05-22 MED ORDER — DIPHENHYDRAMINE HCL 50 MG/ML IJ SOLN
50.0000 mg | Freq: Once | INTRAMUSCULAR | Status: AC
  Administered 2022-05-22: 50 mg via INTRAVENOUS
  Filled 2022-05-22: qty 1

## 2022-05-22 MED ORDER — ACETAMINOPHEN 325 MG PO TABS
650.0000 mg | ORAL_TABLET | Freq: Once | ORAL | Status: AC
  Administered 2022-05-22: 650 mg via ORAL
  Filled 2022-05-22: qty 2

## 2022-05-22 MED ORDER — FAMOTIDINE IN NACL 20-0.9 MG/50ML-% IV SOLN
20.0000 mg | Freq: Once | INTRAVENOUS | Status: AC
  Administered 2022-05-22: 20 mg via INTRAVENOUS
  Filled 2022-05-22: qty 50

## 2022-05-22 MED ORDER — SODIUM CHLORIDE 0.9% FLUSH
10.0000 mL | INTRAVENOUS | Status: DC | PRN
  Administered 2022-05-22: 10 mL

## 2022-05-22 MED ORDER — SODIUM CHLORIDE 0.9 % IV SOLN
10.0000 mg | Freq: Once | INTRAVENOUS | Status: AC
  Administered 2022-05-22: 10 mg via INTRAVENOUS
  Filled 2022-05-22: qty 10

## 2022-05-22 MED ORDER — HEPARIN SOD (PORK) LOCK FLUSH 100 UNIT/ML IV SOLN
500.0000 [IU] | Freq: Once | INTRAVENOUS | Status: AC | PRN
  Administered 2022-05-22: 500 [IU]

## 2022-05-22 MED ORDER — SODIUM CHLORIDE 0.9 % IV SOLN: Freq: Once | INTRAVENOUS | Status: AC

## 2022-05-22 MED ORDER — ATROPINE SULFATE 1 MG/ML IV SOLN
0.5000 mg | Freq: Once | INTRAVENOUS | Status: AC | PRN
  Administered 2022-05-22: 0.5 mg via INTRAVENOUS
  Filled 2022-05-22: qty 1

## 2022-05-22 MED ORDER — SODIUM CHLORIDE 0.9 % IV SOLN
9.5000 mg/kg | Freq: Once | INTRAVENOUS | Status: AC
  Administered 2022-05-22: 720 mg via INTRAVENOUS
  Filled 2022-05-22: qty 72

## 2022-05-22 NOTE — Patient Instructions (Signed)
[facility]  Discharge Instructions: Thank you for choosing [facility] to provide your oncology and hematology care.   If you have a lab appointment with the Cancer Center, please go directly to the Cancer Center and check in at the registration area.   Wear comfortable clothing and clothing appropriate for easy access to any Portacath or PICC line.   We strive to give you quality time with your provider. You may need to reschedule your appointment if you arrive late (15 or more minutes).  Arriving late affects you and other patients whose appointments are after yours.  Also, if you miss three or more appointments without notifying the office, you may be dismissed from the clinic at the provider's discretion.      For prescription refill requests, have your pharmacy contact our office and allow 72 hours for refills to be completed.    Today you received the following chemotherapy and/or immunotherapy agents: Trodelvy      To help prevent nausea and vomiting after your treatment, we encourage you to take your nausea medication as directed.  BELOW ARE SYMPTOMS THAT SHOULD BE REPORTED IMMEDIATELY: *FEVER GREATER THAN 100.4 F (38 C) OR HIGHER *CHILLS OR SWEATING *NAUSEA AND VOMITING THAT IS NOT CONTROLLED WITH YOUR NAUSEA MEDICATION *UNUSUAL SHORTNESS OF BREATH *UNUSUAL BRUISING OR BLEEDING *URINARY PROBLEMS (pain or burning when urinating, or frequent urination) *BOWEL PROBLEMS (unusual diarrhea, constipation, pain near the anus) TENDERNESS IN MOUTH AND THROAT WITH OR WITHOUT PRESENCE OF ULCERS (sore throat, sores in mouth, or a toothache) UNUSUAL RASH, SWELLING OR PAIN  UNUSUAL VAGINAL DISCHARGE OR ITCHING   Items with * indicate a potential emergency and should be followed up as soon as possible or go to the Emergency Department if any problems should occur.  Please show the CHEMOTHERAPY ALERT CARD or IMMUNOTHERAPY ALERT CARD at check-in to  the Emergency Department and triage nurse.  Should you have questions after your visit or need to cancel or reschedule your appointment, please contact [facility]  Dept: [phone]  and follow the prompts.  Office hours are 8:00 a.m. to 4:30 p.m. Monday - Friday. Please note that voicemails left after 4:00 p.m. may not be returned until the following business day.  We are closed weekends and major holidays. You have access to a nurse at all times for urgent questions. Please call the main number to the clinic Dept: [phone] and follow the prompts.   For any non-urgent questions, you may also contact your provider using MyChart. We now offer e-Visits for anyone 18 and older to request care online for non-urgent symptoms. For details visit [email or web address].   Also download the MyChart app! Go to the app store, search "MyChart", open the app, select [facility], and log in with your MyChart username and password.  Masks are optional in the cancer centers. If you would like for your care team to wear a mask while they are taking care of you, please let them know. You may have one support person who is at least 71 years old accompany you for your appointments. 

## 2022-06-03 ENCOUNTER — Ambulatory Visit (HOSPITAL_COMMUNITY)
Admission: RE | Admit: 2022-06-03 | Discharge: 2022-06-03 | Disposition: A | Discharge status: Home / Self Care | Payer: Medicare Other | Source: Ambulatory Visit | Attending: Oncology | Admitting: Oncology

## 2022-06-03 DIAGNOSIS — C679 Malignant neoplasm of bladder, unspecified: Secondary | ICD-10-CM | POA: Diagnosis present

## 2022-06-03 MED ORDER — HEPARIN SOD (PORK) LOCK FLUSH 100 UNIT/ML IV SOLN
500.0000 [IU] | Freq: Once | INTRAVENOUS | Status: AC
  Administered 2022-06-03: 500 [IU] via INTRAVENOUS

## 2022-06-03 MED ORDER — HEPARIN SOD (PORK) LOCK FLUSH 100 UNIT/ML IV SOLN
INTRAVENOUS | Status: AC
  Filled 2022-06-03: qty 5

## 2022-06-03 MED ORDER — IOHEXOL 9 MG/ML PO SOLN
1000.0000 mL | Freq: Once | ORAL | Status: AC
  Administered 2022-06-03: 1000 mL via ORAL

## 2022-06-03 MED ORDER — IOHEXOL 300 MG/ML  SOLN
100.0000 mL | Freq: Once | INTRAMUSCULAR | Status: AC | PRN
  Administered 2022-06-03: 100 mL via INTRAVENOUS

## 2022-06-03 MED ORDER — SODIUM CHLORIDE (PF) 0.9 % IJ SOLN
INTRAMUSCULAR | Status: AC
  Filled 2022-06-03: qty 50

## 2022-06-04 ENCOUNTER — Other Ambulatory Visit: Payer: Self-pay | Admitting: Oncology

## 2022-06-04 ENCOUNTER — Telehealth: Payer: Self-pay | Admitting: *Deleted

## 2022-06-04 DIAGNOSIS — C679 Malignant neoplasm of bladder, unspecified: Secondary | ICD-10-CM

## 2022-06-04 MED ORDER — OXYCODONE-ACETAMINOPHEN 10-325 MG PO TABS: 1.0000 | ORAL_TABLET | ORAL | 0 refills | Status: DC | PRN

## 2022-06-04 MED FILL — Fosaprepitant Dimeglumine For IV Infusion 150 MG (Base Eq): INTRAVENOUS | Qty: 5 | Status: AC

## 2022-06-04 MED FILL — Dexamethasone Sodium Phosphate Inj 100 MG/10ML: INTRAMUSCULAR | Qty: 1 | Status: AC

## 2022-06-04 NOTE — Telephone Encounter (Signed)
Pt called for a refill of Percocet to be sent to [facility] in [place]

## 2022-06-05 ENCOUNTER — Inpatient Hospital Stay: Payer: Medicare Other | Attending: Oncology | Admitting: Oncology

## 2022-06-05 ENCOUNTER — Other Ambulatory Visit: Payer: Self-pay

## 2022-06-05 ENCOUNTER — Inpatient Hospital Stay: Payer: Medicare Other

## 2022-06-05 ENCOUNTER — Ambulatory Visit: Payer: Medicare Other

## 2022-06-05 ENCOUNTER — Inpatient Hospital Stay: Payer: Medicare Other | Admitting: Dietician

## 2022-06-05 VITALS — BP 118/60 | HR 57 | Temp 97.5°F | Resp 14 | Ht 74.0 in | Wt 166.9 lb

## 2022-06-05 DIAGNOSIS — C679 Malignant neoplasm of bladder, unspecified: Secondary | ICD-10-CM

## 2022-06-05 DIAGNOSIS — R590 Localized enlarged lymph nodes: Secondary | ICD-10-CM | POA: Diagnosis not present

## 2022-06-05 DIAGNOSIS — G8929 Other chronic pain: Secondary | ICD-10-CM | POA: Insufficient documentation

## 2022-06-05 DIAGNOSIS — Z5112 Encounter for antineoplastic immunotherapy: Secondary | ICD-10-CM | POA: Diagnosis not present

## 2022-06-05 DIAGNOSIS — R918 Other nonspecific abnormal finding of lung field: Secondary | ICD-10-CM | POA: Diagnosis not present

## 2022-06-05 DIAGNOSIS — I700 Atherosclerosis of aorta: Secondary | ICD-10-CM | POA: Insufficient documentation

## 2022-06-05 DIAGNOSIS — R197 Diarrhea, unspecified: Secondary | ICD-10-CM | POA: Diagnosis not present

## 2022-06-05 DIAGNOSIS — R5383 Other fatigue: Secondary | ICD-10-CM | POA: Diagnosis not present

## 2022-06-05 DIAGNOSIS — Z95828 Presence of other vascular implants and grafts: Secondary | ICD-10-CM

## 2022-06-05 DIAGNOSIS — R112 Nausea with vomiting, unspecified: Secondary | ICD-10-CM | POA: Insufficient documentation

## 2022-06-05 DIAGNOSIS — R609 Edema, unspecified: Secondary | ICD-10-CM | POA: Insufficient documentation

## 2022-06-05 DIAGNOSIS — Z79899 Other long term (current) drug therapy: Secondary | ICD-10-CM | POA: Diagnosis not present

## 2022-06-05 LAB — CMP (CANCER CENTER ONLY)
ALT: 28 U/L (ref 0–44)
AST: 25 U/L (ref 15–41)
Albumin: 3.4 g/dL — ABNORMAL LOW (ref 3.5–5.0)
Alkaline Phosphatase: 64 U/L (ref 38–126)
Anion gap: 4 — ABNORMAL LOW (ref 5–15)
BUN: 22 mg/dL (ref 8–23)
CO2: 25 mmol/L (ref 22–32)
Calcium: 7.9 mg/dL — ABNORMAL LOW (ref 8.9–10.3)
Chloride: 108 mmol/L (ref 98–111)
Creatinine: 0.83 mg/dL (ref 0.61–1.24)
GFR, Estimated: >60 mL/min (ref >60)
Glucose, Bld: 204 mg/dL — ABNORMAL HIGH (ref 70–99)
Potassium: 4 mmol/L (ref 3.5–5.1)
Sodium: 137 mmol/L (ref 135–145)
Total Bilirubin: 0.4 mg/dL (ref 0.3–1.2)
Total Protein: 5.2 g/dL — ABNORMAL LOW (ref 6.5–8.1)

## 2022-06-05 LAB — CBC WITH DIFFERENTIAL (CANCER CENTER ONLY)
Abs Immature Granulocytes: 0.01 10*3/uL (ref 0.00–0.07)
Basophils Absolute: 0 10*3/uL (ref 0.0–0.1)
Basophils Relative: 0 %
Eosinophils Absolute: 0.1 10*3/uL (ref 0.0–0.5)
Eosinophils Relative: 2 %
HCT: 32.1 % — ABNORMAL LOW (ref 39.0–52.0)
Hemoglobin: 11.4 g/dL — ABNORMAL LOW (ref 13.0–17.0)
Immature Granulocytes: 0 %
Lymphocytes Relative: 23 %
Lymphs Abs: 1.1 10*3/uL (ref 0.7–4.0)
MCH: 30.8 pg (ref 26.0–34.0)
MCHC: 35.5 g/dL (ref 30.0–36.0)
MCV: 86.8 fL (ref 80.0–100.0)
Monocytes Absolute: 0.4 10*3/uL (ref 0.1–1.0)
Monocytes Relative: 9 %
Neutro Abs: 3.1 10*3/uL (ref 1.7–7.7)
Neutrophils Relative %: 66 %
Platelet Count: 188 10*3/uL (ref 150–400)
RBC: 3.7 MIL/uL — ABNORMAL LOW (ref 4.22–5.81)
RDW: 13.6 % (ref 11.5–15.5)
WBC Count: 4.8 10*3/uL (ref 4.0–10.5)
nRBC: 0 % (ref 0.0–0.2)

## 2022-06-05 LAB — MAGNESIUM: Magnesium: 1.1 mg/dL — ABNORMAL LOW (ref 1.7–2.4)

## 2022-06-05 LAB — PHOSPHORUS: Phosphorus: 3.7 mg/dL (ref 2.5–4.6)

## 2022-06-05 MED ORDER — DIPHENHYDRAMINE HCL 50 MG/ML IJ SOLN
50.0000 mg | Freq: Once | INTRAMUSCULAR | Status: AC
  Administered 2022-06-05: 50 mg via INTRAVENOUS
  Filled 2022-06-05: qty 1

## 2022-06-05 MED ORDER — DIPHENOXYLATE-ATROPINE 2.5-0.025 MG PO TABS: ORAL_TABLET | ORAL | 2 refills | Status: DC

## 2022-06-05 MED ORDER — FAMOTIDINE IN NACL 20-0.9 MG/50ML-% IV SOLN
20.0000 mg | Freq: Once | INTRAVENOUS | Status: AC
  Administered 2022-06-05: 20 mg via INTRAVENOUS
  Filled 2022-06-05: qty 50

## 2022-06-05 MED ORDER — HEPARIN SOD (PORK) LOCK FLUSH 100 UNIT/ML IV SOLN
500.0000 [IU] | Freq: Once | INTRAVENOUS | Status: AC | PRN
  Administered 2022-06-05: 500 [IU]

## 2022-06-05 MED ORDER — ATROPINE SULFATE 1 MG/ML IV SOLN
0.5000 mg | Freq: Once | INTRAVENOUS | Status: AC | PRN
  Administered 2022-06-05: 0.5 mg via INTRAVENOUS
  Filled 2022-06-05: qty 1

## 2022-06-05 MED ORDER — SODIUM CHLORIDE 0.9 % IV SOLN
150.0000 mg | Freq: Once | INTRAVENOUS | Status: AC
  Administered 2022-06-05: 150 mg via INTRAVENOUS
  Filled 2022-06-05: qty 150

## 2022-06-05 MED ORDER — SODIUM CHLORIDE 0.9 % IV SOLN: Freq: Once | INTRAVENOUS | Status: AC

## 2022-06-05 MED ORDER — SODIUM CHLORIDE 0.9% FLUSH
10.0000 mL | Freq: Once | INTRAVENOUS | Status: AC
  Administered 2022-06-05: 10 mL

## 2022-06-05 MED ORDER — SODIUM CHLORIDE 0.9% FLUSH
10.0000 mL | INTRAVENOUS | Status: DC | PRN
  Administered 2022-06-05: 10 mL

## 2022-06-05 MED ORDER — PALONOSETRON HCL INJECTION 0.25 MG/5ML
0.2500 mg | Freq: Once | INTRAVENOUS | Status: AC
  Administered 2022-06-05: 0.25 mg via INTRAVENOUS
  Filled 2022-06-05: qty 5

## 2022-06-05 MED ORDER — ACETAMINOPHEN 325 MG PO TABS
650.0000 mg | ORAL_TABLET | Freq: Once | ORAL | Status: AC
  Administered 2022-06-05: 650 mg via ORAL
  Filled 2022-06-05: qty 2

## 2022-06-05 MED ORDER — SODIUM CHLORIDE 0.9 % IV SOLN
9.5000 mg/kg | Freq: Once | INTRAVENOUS | Status: AC
  Administered 2022-06-05: 720 mg via INTRAVENOUS
  Filled 2022-06-05: qty 72

## 2022-06-05 MED ORDER — SODIUM CHLORIDE 0.9 % IV SOLN
10.0000 mg | Freq: Once | INTRAVENOUS | Status: AC
  Administered 2022-06-05: 10 mg via INTRAVENOUS
  Filled 2022-06-05: qty 10

## 2022-06-05 NOTE — Progress Notes (Signed)
Okay to treat with Mag1.1 per Dr. Alen Blew and patient is aware to not take the Mag po since he is having Diarrhea per Dr. Alen Blew. Okay to proceed with treatment while waiting for phos level.

## 2022-06-05 NOTE — Progress Notes (Signed)
Nutrition Follow-up:  Patient with bladder cancer. Lynparza discontinued July 2023 due to progression. He is currently receiving Sacituzumab q21d.  Met with patient in infusion. He reports feeling sleepy after IV Benadryl. Patient endorses good appetite and eating well. He enjoys Sunday buffet at [facility], states he would not miss it. Patient endorses single large episode of diarrhea every morning. He eliminated dairy from his diet. This did not help. MD has prescribed lomotil as needed. Patient would like to gain weight.    Medications: percocet, lomotil  Labs: Mg 1.1, glucose 204  Anthropometrics: Wt 166 lb 14.4 oz today stable  9/13 - 167 lb 8/23 - 167 lb  8/1 - 169 lb 6.4 oz  NUTRITION DIAGNOSIS: Unintended weight loss stable   INTERVENTION:  Encouraged smaller more frequent meals/snacks with high calorie high protein foods  Suggested Ensure Plus/equivalent TID (samples Ensure Complete, CIB powder, Boost VHC provided) Lomotil as needed for diarrhea per MD    MONITORING, EVALUATION, GOAL: weight trends, intake   NEXT VISIT: Wednesday November 8 during infusion

## 2022-06-05 NOTE — Progress Notes (Signed)
Hematology and Oncology Follow Up  Danny Wood [id number] [birth year] 71 y.o. 06/05/2022 9:04 AM Danny Wood, MDShadad, Danny Dad, MD      Principle Diagnosis:  73 year old man with bladder cancer diagnosed in 2015.  He developed stage IV high-grade urothelial carcinoma with pelvic adenopathy in 2019.    Prior Therapy:  He underwent a cystoscopy and a TURBT on 05/17/2014.    Neoadjuvant systemic chemotherapy in the form of gemcitabine and cisplatin cycle 1 day 1 is on 06/17/2014. He is S/P two cycles completed in 07/2014.  Therapy tolerated poorly with symptoms of nausea and vomiting and worsening renal function.   He is status post robotic cystoprostatectomy and bilateral lymphadenectomy completed on September 09, 2014.  The final pathology revealed T2N0 without any lymph node involvement.   He developed pelvic adenopathy that was biopsy-proven on Jan 14, 2018 to be metastatic urothelial carcinoma.   Carboplatin and gemcitabine cycle 1 started on 02/17/2018.  He completed 8 cycles of therapy in December 2019.   Pembrolizumab 200 mg every 3 weeks started on April 01, 2019.  He is status post 7 cycles of therapy in December 2020.  Therapy stopped because of of patient preference, excellent clinical response and treatment holiday.  He developed progression of disease in June 2021.   Padcev started on February 11, 2020.  He has been receiving monthly maintenance therapy up till March 2023.  He developed disease progression at that time.  [id number] analysis showed an ATM mutation as well as ERBB2 mutation.  Lynparza 300 mg twice a day started on December 26, 2021.  Therapy discontinued in July 2023 for progression of disease.       Current therapy:  Sacituzumab govitecan started on 9 March 12, 2022.  He is here for day 1 cycle 5 of therapy.          Interim History: Danny Wood returns today for a follow-up.  Since the last visit, he reports no major changes in his health.  He  continues to tolerate the current treatment with few complaints.  He has reported occasional diarrhea and fatigue but no nausea, vomiting or abdominal pain.  He denies any hospitalizations or illnesses.  His performance status quality of life remained reasonable.  He is chronic pain is manageable with morphine twice a day and Percocet as needed.   Medications: Updated on review. Current Outpatient Medications  Medication Sig Dispense Refill   Clotrimazole 1 % LOTN Apply 1 Application topically 2 (two) times daily as needed. 30 mL 0   diphenoxylate-atropine (LOMOTIL) 2.5-0.025 MG tablet 1 to 2 PO QID prn diarrhea (Patient not taking: Reported on 04/24/2022) 45 tablet 2   furosemide (LASIX) 20 MG tablet Take 2 tablets (40 mg total) by mouth daily. (Patient not taking: Reported on 04/24/2022) 90 tablet 1   magnesium oxide (MAG-OX) 400 (240 Mg) MG tablet Take 1 tablet (400 mg total) by mouth daily. 30 tablet 1   morphine (MS CONTIN) 30 MG 12 hr tablet Take 1 tablet (30 mg total) by mouth every 12 (twelve) hours. 60 tablet 0   nystatin (MYCOSTATIN/NYSTOP) powder Apply 1 Application topically 3 (three) times daily. 30 g 0   oxyCODONE-acetaminophen (PERCOCET) 10-325 MG tablet Take 1 tablet by mouth every 4 (four) hours as needed for pain. 90 tablet 0   No current facility-administered medications for this visit.   Physical exam  Blood pressure 118/60, pulse (!) 57, temperature (!) 97.5 F (36.4 C), temperature source Temporal, resp. rate 14,  height 6' 2"  (1.88 m), weight 166 lb 14.4 oz (75.7 kg), SpO2 100 %.   ECOG 1    General appearance: Alert, awake without any distress. Head: Atraumatic without abnormalities Oropharynx: Without any thrush or ulcers. Eyes: No scleral icterus. Lymph nodes: No lymphadenopathy noted in the cervical, supraclavicular, or axillary nodes Heart:regular rate and rhythm, without any murmurs or gallops.   Lung: Clear to auscultation without any rhonchi, wheezes or  dullness to percussion. Abdomin: Soft, nontender without any shifting dullness or ascites. Musculoskeletal: No clubbing or cyanosis. Neurological: No motor or sensory deficits. Skin: No rashes or lesions.      Lab Results: Lab Results  Component Value Date   WBC 5.5 05/22/2022   HGB 12.8 (L) 05/22/2022   HCT 36.1 (L) 05/22/2022   MCV 87.4 05/22/2022   PLT 230 05/22/2022     Chemistry      Component Value Date/Time   NA 138 05/22/2022 1123   NA 139 08/05/2014 1335   K 3.7 05/22/2022 1123   K 4.3 08/05/2014 1335   CL 102 05/22/2022 1123   CO2 31 05/22/2022 1123   CO2 26 08/05/2014 1335   BUN 20 05/22/2022 1123   BUN 18.2 08/05/2014 1335   CREATININE 0.89 05/22/2022 1123   CREATININE 1.1 08/05/2014 1335      Component Value Date/Time   CALCIUM 8.8 (L) 05/22/2022 1123   CALCIUM 9.5 08/05/2014 1335   ALKPHOS 65 05/22/2022 1123   ALKPHOS 98 08/05/2014 1335   AST 26 05/22/2022 1123   AST 45 (H) 08/05/2014 1335   ALT 32 05/22/2022 1123   ALT 73 (H) 08/05/2014 1335   BILITOT 0.3 05/22/2022 1123   BILITOT 0.47 08/05/2014 1335     IMPRESSION: 1. Slight interval increase in size of central mesenteric and retroperitoneal nodal disease. Some of this increase could be accentuated by changes in mesenteric position but there is enlargement of LEFT retroperitoneal nodal disease albeit slight. No signs of new disease. 2. Post cystectomy and prostatectomy with RIGHT lower quadrant ileal conduit diversion. 3. Edema in fascial planes of the pelvis and in subcutaneous fat extending into penis and scrotum is similar to previous imaging. 4. Apical nodularity in the RIGHT upper lobe is similar to PET imaging dating back to December of 2022, dominant area measuring 9 mm previously 9-10 mm. 5. Aortic atherosclerosis.   Aortic Atherosclerosis (ICD10-I70.0).      Impression and Plan:  70 year old man with:       1.  Bladder cancer diagnosed in 2019.  He developed stage IV  high-grade urothelial carcinoma with pelvic adenopathy.   His disease status was updated at this time including review of imaging studies obtained on June 03, 2022.  His scan showed overall stable disease but may be slight progression.  No evidence of new areas of metastasis noted at this time.  Risks and benefits of continuing this treatment were discussed at this time and he is agreeable to continue but would like to have a treatment break for few weeks before resuming the next cycle of treatment.  Alternative treatment options in the future could be considered if he has disease progression.  These would include HER2 targeted therapy as well as retreatment with Pembrolizumab.  This is based on [employer] analysis obtained March 2023.  2. Pain: He continues to be on morphine and oxycodone with pain is manageable.   3.  IV access: Port-A-Cath remains in use without any issues.  4.  Diarrhea: Related to  his chemotherapy.  We discussed the instructions how to use Imodium and prescription for Lomotil will be available to him.  5.  Follow-up: He will return in the next 3 to 4 weeks for the start of the next cycle of therapy.   30  minutes were dedicated to this visit.  The time was spent on updating disease status, treatment choices and outlining future plan of care discussion.  [name], MD 06/05/2022 9:04 AM

## 2022-06-06 ENCOUNTER — Other Ambulatory Visit: Payer: Self-pay

## 2022-06-07 ENCOUNTER — Other Ambulatory Visit: Payer: Self-pay

## 2022-06-12 ENCOUNTER — Inpatient Hospital Stay: Payer: Medicare Other

## 2022-06-12 ENCOUNTER — Other Ambulatory Visit: Payer: Self-pay | Admitting: Oncology

## 2022-06-12 VITALS — BP 105/54 | HR 53 | Temp 97.8°F | Resp 17 | Wt 166.5 lb

## 2022-06-12 DIAGNOSIS — C679 Malignant neoplasm of bladder, unspecified: Secondary | ICD-10-CM

## 2022-06-12 DIAGNOSIS — Z95828 Presence of other vascular implants and grafts: Secondary | ICD-10-CM

## 2022-06-12 DIAGNOSIS — Z5112 Encounter for antineoplastic immunotherapy: Secondary | ICD-10-CM | POA: Diagnosis not present

## 2022-06-12 LAB — CBC WITH DIFFERENTIAL (CANCER CENTER ONLY)
Abs Immature Granulocytes: 0.03 10*3/uL (ref 0.00–0.07)
Basophils Absolute: 0 10*3/uL (ref 0.0–0.1)
Basophils Relative: 1 %
Eosinophils Absolute: 0 10*3/uL (ref 0.0–0.5)
Eosinophils Relative: 1 %
HCT: 31.8 % — ABNORMAL LOW (ref 39.0–52.0)
Hemoglobin: 11.2 g/dL — ABNORMAL LOW (ref 13.0–17.0)
Immature Granulocytes: 1 %
Lymphocytes Relative: 36 %
Lymphs Abs: 1.2 10*3/uL (ref 0.7–4.0)
MCH: 30.9 pg (ref 26.0–34.0)
MCHC: 35.2 g/dL (ref 30.0–36.0)
MCV: 87.6 fL (ref 80.0–100.0)
Monocytes Absolute: 0.4 10*3/uL (ref 0.1–1.0)
Monocytes Relative: 11 %
Neutro Abs: 1.7 10*3/uL (ref 1.7–7.7)
Neutrophils Relative %: 50 %
Platelet Count: 228 10*3/uL (ref 150–400)
RBC: 3.63 MIL/uL — ABNORMAL LOW (ref 4.22–5.81)
RDW: 13.1 % (ref 11.5–15.5)
WBC Count: 3.3 10*3/uL — ABNORMAL LOW (ref 4.0–10.5)
nRBC: 0 % (ref 0.0–0.2)

## 2022-06-12 LAB — CMP (CANCER CENTER ONLY)
ALT: 44 U/L (ref 0–44)
AST: 34 U/L (ref 15–41)
Albumin: 3.3 g/dL — ABNORMAL LOW (ref 3.5–5.0)
Alkaline Phosphatase: 57 U/L (ref 38–126)
Anion gap: 4 — ABNORMAL LOW (ref 5–15)
BUN: 22 mg/dL (ref 8–23)
CO2: 28 mmol/L (ref 22–32)
Calcium: 8.1 mg/dL — ABNORMAL LOW (ref 8.9–10.3)
Chloride: 104 mmol/L (ref 98–111)
Creatinine: 0.88 mg/dL (ref 0.61–1.24)
GFR, Estimated: >60 mL/min (ref >60)
Glucose, Bld: 182 mg/dL — ABNORMAL HIGH (ref 70–99)
Potassium: 4.4 mmol/L (ref 3.5–5.1)
Sodium: 136 mmol/L (ref 135–145)
Total Bilirubin: 0.3 mg/dL (ref 0.3–1.2)
Total Protein: 5.5 g/dL — ABNORMAL LOW (ref 6.5–8.1)

## 2022-06-12 LAB — PHOSPHORUS: Phosphorus: 3.7 mg/dL (ref 2.5–4.6)

## 2022-06-12 LAB — MAGNESIUM: Magnesium: 1.2 mg/dL — ABNORMAL LOW (ref 1.7–2.4)

## 2022-06-12 MED ORDER — SODIUM CHLORIDE 0.9% FLUSH
10.0000 mL | Freq: Once | INTRAVENOUS | Status: AC
  Administered 2022-06-12: 10 mL

## 2022-06-12 MED ORDER — SODIUM CHLORIDE 0.9 % IV SOLN: Freq: Once | INTRAVENOUS | Status: AC

## 2022-06-12 MED ORDER — MAGNESIUM SULFATE 2 GM/50ML IV SOLN
2.0000 g | Freq: Once | INTRAVENOUS | Status: AC
  Administered 2022-06-12: 2 g via INTRAVENOUS
  Filled 2022-06-12: qty 50

## 2022-06-12 MED ORDER — HEPARIN SOD (PORK) LOCK FLUSH 100 UNIT/ML IV SOLN
500.0000 [IU] | Freq: Once | INTRAVENOUS | Status: AC
  Administered 2022-06-12: 500 [IU]

## 2022-06-12 NOTE — Progress Notes (Signed)
No treatment today with Mag 1.2 per [facility]. Received VO for 500NS and 2 gm Mag from Dr. Alen Blew as well.

## 2022-06-25 ENCOUNTER — Other Ambulatory Visit: Payer: Self-pay

## 2022-06-25 ENCOUNTER — Other Ambulatory Visit: Payer: Self-pay | Admitting: *Deleted

## 2022-06-25 DIAGNOSIS — C679 Malignant neoplasm of bladder, unspecified: Secondary | ICD-10-CM

## 2022-06-25 MED ORDER — OXYCODONE-ACETAMINOPHEN 10-325 MG PO TABS: 1.0000 | ORAL_TABLET | ORAL | 0 refills | Status: DC | PRN

## 2022-07-02 MED FILL — Dexamethasone Sodium Phosphate Inj 100 MG/10ML: INTRAMUSCULAR | Qty: 1 | Status: AC

## 2022-07-02 MED FILL — Fosaprepitant Dimeglumine For IV Infusion 150 MG (Base Eq): INTRAVENOUS | Qty: 5 | Status: AC

## 2022-07-03 ENCOUNTER — Inpatient Hospital Stay: Payer: Medicare Other

## 2022-07-03 ENCOUNTER — Inpatient Hospital Stay: Payer: Medicare Other | Attending: Oncology

## 2022-07-03 ENCOUNTER — Inpatient Hospital Stay (HOSPITAL_BASED_OUTPATIENT_CLINIC_OR_DEPARTMENT_OTHER): Payer: Medicare Other | Admitting: Oncology

## 2022-07-03 VITALS — BP 121/65 | HR 68 | Temp 98.1°F | Resp 18

## 2022-07-03 VITALS — BP 119/67 | HR 61 | Temp 97.8°F | Resp 18 | Ht 74.0 in | Wt 173.5 lb

## 2022-07-03 DIAGNOSIS — I890 Lymphedema, not elsewhere classified: Secondary | ICD-10-CM | POA: Insufficient documentation

## 2022-07-03 DIAGNOSIS — C679 Malignant neoplasm of bladder, unspecified: Secondary | ICD-10-CM | POA: Diagnosis present

## 2022-07-03 DIAGNOSIS — Z95828 Presence of other vascular implants and grafts: Secondary | ICD-10-CM

## 2022-07-03 DIAGNOSIS — C775 Secondary and unspecified malignant neoplasm of intrapelvic lymph nodes: Secondary | ICD-10-CM | POA: Insufficient documentation

## 2022-07-03 DIAGNOSIS — Z79899 Other long term (current) drug therapy: Secondary | ICD-10-CM | POA: Insufficient documentation

## 2022-07-03 DIAGNOSIS — R112 Nausea with vomiting, unspecified: Secondary | ICD-10-CM | POA: Diagnosis not present

## 2022-07-03 DIAGNOSIS — R197 Diarrhea, unspecified: Secondary | ICD-10-CM | POA: Diagnosis not present

## 2022-07-03 DIAGNOSIS — Z5112 Encounter for antineoplastic immunotherapy: Secondary | ICD-10-CM | POA: Insufficient documentation

## 2022-07-03 DIAGNOSIS — G893 Neoplasm related pain (acute) (chronic): Secondary | ICD-10-CM | POA: Insufficient documentation

## 2022-07-03 LAB — MAGNESIUM: Magnesium: 1.2 mg/dL — ABNORMAL LOW (ref 1.7–2.4)

## 2022-07-03 LAB — CMP (CANCER CENTER ONLY)
ALT: 34 U/L (ref 0–44)
AST: 28 U/L (ref 15–41)
Albumin: 3.2 g/dL — ABNORMAL LOW (ref 3.5–5.0)
Alkaline Phosphatase: 58 U/L (ref 38–126)
Anion gap: 3 — ABNORMAL LOW (ref 5–15)
BUN: 19 mg/dL (ref 8–23)
CO2: 27 mmol/L (ref 22–32)
Calcium: 8.4 mg/dL — ABNORMAL LOW (ref 8.9–10.3)
Chloride: 106 mmol/L (ref 98–111)
Creatinine: 0.8 mg/dL (ref 0.61–1.24)
GFR, Estimated: >60 mL/min (ref >60)
Glucose, Bld: 207 mg/dL — ABNORMAL HIGH (ref 70–99)
Potassium: 4 mmol/L (ref 3.5–5.1)
Sodium: 136 mmol/L (ref 135–145)
Total Bilirubin: 0.3 mg/dL (ref 0.3–1.2)
Total Protein: 5.6 g/dL — ABNORMAL LOW (ref 6.5–8.1)

## 2022-07-03 LAB — CBC WITH DIFFERENTIAL (CANCER CENTER ONLY)
Abs Immature Granulocytes: 0.02 10*3/uL (ref 0.00–0.07)
Basophils Absolute: 0 10*3/uL (ref 0.0–0.1)
Basophils Relative: 1 %
Eosinophils Absolute: 0.1 10*3/uL (ref 0.0–0.5)
Eosinophils Relative: 1 %
HCT: 36.8 % — ABNORMAL LOW (ref 39.0–52.0)
Hemoglobin: 12.9 g/dL — ABNORMAL LOW (ref 13.0–17.0)
Immature Granulocytes: 0 %
Lymphocytes Relative: 19 %
Lymphs Abs: 1.2 10*3/uL (ref 0.7–4.0)
MCH: 30.9 pg (ref 26.0–34.0)
MCHC: 35.1 g/dL (ref 30.0–36.0)
MCV: 88 fL (ref 80.0–100.0)
Monocytes Absolute: 0.5 10*3/uL (ref 0.1–1.0)
Monocytes Relative: 8 %
Neutro Abs: 4.5 10*3/uL (ref 1.7–7.7)
Neutrophils Relative %: 71 %
Platelet Count: 221 10*3/uL (ref 150–400)
RBC: 4.18 MIL/uL — ABNORMAL LOW (ref 4.22–5.81)
RDW: 13.3 % (ref 11.5–15.5)
WBC Count: 6.4 10*3/uL (ref 4.0–10.5)
nRBC: 0 % (ref 0.0–0.2)

## 2022-07-03 LAB — PHOSPHORUS: Phosphorus: 3.7 mg/dL (ref 2.5–4.6)

## 2022-07-03 MED ORDER — SODIUM CHLORIDE 0.9 % IV SOLN
150.0000 mg | Freq: Once | INTRAVENOUS | Status: AC
  Administered 2022-07-03: 150 mg via INTRAVENOUS
  Filled 2022-07-03: qty 150

## 2022-07-03 MED ORDER — ACETAMINOPHEN 325 MG PO TABS
650.0000 mg | ORAL_TABLET | Freq: Once | ORAL | Status: AC
  Administered 2022-07-03: 650 mg via ORAL
  Filled 2022-07-03: qty 2

## 2022-07-03 MED ORDER — HEPARIN SOD (PORK) LOCK FLUSH 100 UNIT/ML IV SOLN
500.0000 [IU] | Freq: Once | INTRAVENOUS | Status: AC | PRN
  Administered 2022-07-03: 500 [IU]

## 2022-07-03 MED ORDER — DIPHENHYDRAMINE HCL 50 MG/ML IJ SOLN
50.0000 mg | Freq: Once | INTRAMUSCULAR | Status: AC
  Administered 2022-07-03: 50 mg via INTRAVENOUS
  Filled 2022-07-03: qty 1

## 2022-07-03 MED ORDER — SODIUM CHLORIDE 0.9 % IV SOLN
10.0000 mg | Freq: Once | INTRAVENOUS | Status: AC
  Administered 2022-07-03: 10 mg via INTRAVENOUS
  Filled 2022-07-03: qty 10

## 2022-07-03 MED ORDER — ATROPINE SULFATE 1 MG/ML IV SOLN
0.5000 mg | Freq: Once | INTRAVENOUS | Status: AC | PRN
  Administered 2022-07-03: 0.5 mg via INTRAVENOUS
  Filled 2022-07-03: qty 1

## 2022-07-03 MED ORDER — SODIUM CHLORIDE 0.9% FLUSH
10.0000 mL | Freq: Once | INTRAVENOUS | Status: AC
  Administered 2022-07-03: 10 mL

## 2022-07-03 MED ORDER — MAGNESIUM SULFATE 2 GM/50ML IV SOLN
2.0000 g | Freq: Once | INTRAVENOUS | Status: AC
  Administered 2022-07-03: 2 g via INTRAVENOUS
  Filled 2022-07-03: qty 50

## 2022-07-03 MED ORDER — LOPERAMIDE HCL 2 MG PO CAPS: 2.0000 mg | ORAL_CAPSULE | ORAL | 1 refills | Status: DC | PRN

## 2022-07-03 MED ORDER — SODIUM CHLORIDE 0.9 % IV SOLN
540.0000 mg | Freq: Once | INTRAVENOUS | Status: AC
  Administered 2022-07-03: 540 mg via INTRAVENOUS
  Filled 2022-07-03: qty 54

## 2022-07-03 MED ORDER — FAMOTIDINE IN NACL 20-0.9 MG/50ML-% IV SOLN
20.0000 mg | Freq: Once | INTRAVENOUS | Status: AC
  Administered 2022-07-03: 20 mg via INTRAVENOUS
  Filled 2022-07-03: qty 50

## 2022-07-03 MED ORDER — SODIUM CHLORIDE 0.9% IV SOLUTION: Freq: Once | INTRAVENOUS | Status: AC

## 2022-07-03 MED ORDER — SODIUM CHLORIDE 0.9% FLUSH
10.0000 mL | INTRAVENOUS | Status: DC | PRN
  Administered 2022-07-03: 10 mL

## 2022-07-03 MED ORDER — SODIUM CHLORIDE 0.9 % IV SOLN: Freq: Once | INTRAVENOUS | Status: AC

## 2022-07-03 MED ORDER — PALONOSETRON HCL INJECTION 0.25 MG/5ML
0.2500 mg | Freq: Once | INTRAVENOUS | Status: AC
  Administered 2022-07-03: 0.25 mg via INTRAVENOUS
  Filled 2022-07-03: qty 5

## 2022-07-03 NOTE — Progress Notes (Signed)
Ok to treat today per Dr. Alen Blew with a Magnesium of 1.2 (to be replaced) and no resulted Phosphorus level.  Once resulted- phosphorus was stable/normal at 3.7 today.

## 2022-07-03 NOTE — Progress Notes (Signed)
Hematology and Oncology Follow Up  Danny Wood [id number] Jan 14, 1951 71 y.o. 07/03/2022 8:21 AM [name], MDShadad, Mathis Dad, MD      Principle Diagnosis:  89 year old man with stage IV high-grade urothelial carcinoma of the bladder with pelvic adenopathy diagnosed in 2019.  He presented with localized disease in 2015.   Prior Therapy:  He underwent a cystoscopy and a TURBT on 05/17/2014.    Neoadjuvant systemic chemotherapy in the form of gemcitabine and cisplatin cycle 1 day 1 is on 06/17/2014. He is S/P two cycles completed in 07/2014.  Therapy tolerated poorly with symptoms of nausea and vomiting and worsening renal function.   He is status post robotic cystoprostatectomy and bilateral lymphadenectomy completed on September 09, 2014.  The final pathology revealed T2N0 without any lymph node involvement.   He developed pelvic adenopathy that was biopsy-proven on Jan 14, 2018 to be metastatic urothelial carcinoma.   Carboplatin and gemcitabine cycle 1 started on 02/17/2018.  He completed 8 cycles of therapy in December 2019.   Pembrolizumab 200 mg every 3 weeks started on April 01, 2019.  He is status post 7 cycles of therapy in December 2020.  Therapy stopped because of of patient preference, excellent clinical response and treatment holiday.  He developed progression of disease in June 2021.   Padcev started on February 11, 2020.  He has been receiving monthly maintenance therapy up till March 2023.  He developed disease progression at that time.  [id number] analysis showed an ATM mutation as well as ERBB2 mutation.  Lynparza 300 mg twice a day started on December 26, 2021.  Therapy discontinued in July 2023 for progression of disease.       Current therapy:  Sacituzumab govitecan started on 9 March 12, 2022.  He is here for day 1 cycle 6 of therapy.          Interim History: Danny Wood presents today for repeat evaluation.  Since last visit, he reports no major  changes in his health.  He continues to struggle with diarrhea despite the use of Lomotil.  He denies any nausea, vomiting or abdominal pain.  He is eating better and has gained weight.  Continues to have lymphedema and pain in his right lower extremity.  The diarrhea has been explosive at times especially overnight and during the day is manageable.   Medications: Reviewed without changes. Current Outpatient Medications  Medication Sig Dispense Refill   Clotrimazole 1 % LOTN Apply 1 Application topically 2 (two) times daily as needed. 30 mL 0   diphenoxylate-atropine (LOMOTIL) 2.5-0.025 MG tablet 1 to 2 PO QID prn diarrhea 45 tablet 2   furosemide (LASIX) 20 MG tablet Take 2 tablets (40 mg total) by mouth daily. (Patient not taking: Reported on 04/24/2022) 90 tablet 1   magnesium oxide (MAG-OX) 400 (240 Mg) MG tablet Take 1 tablet (400 mg total) by mouth daily. 30 tablet 1   morphine (MS CONTIN) 30 MG 12 hr tablet Take 1 tablet (30 mg total) by mouth every 12 (twelve) hours. 60 tablet 0   nystatin (MYCOSTATIN/NYSTOP) powder Apply 1 Application topically 3 (three) times daily. 30 g 0   oxyCODONE-acetaminophen (PERCOCET) 10-325 MG tablet Take 1 tablet by mouth every 4 (four) hours as needed for pain. 90 tablet 0   No current facility-administered medications for this visit.   Physical exam  Blood pressure 119/67, pulse 61, temperature 97.8 F (36.6 C), temperature source Temporal, resp. rate 18, height 6' 2" (1.88 m), weight  173 lb 8 oz (78.7 kg), SpO2 100 %.    ECOG 1   General appearance: Comfortable appearing without any discomfort Head: Normocephalic without any trauma Oropharynx: Mucous membranes are moist and pink without any thrush or ulcers. Eyes: Pupils are equal and round reactive to light. Lymph nodes: No cervical, supraclavicular, inguinal or axillary lymphadenopathy.   Heart:regular rate and rhythm.  S1 and S2.  Right lower extremity edema. Lung: Clear without any rhonchi or  wheezes.  No dullness to percussion. Abdomin: Soft, nontender, nondistended with good bowel sounds.  No hepatosplenomegaly. Musculoskeletal: No joint deformity or effusion.  Full range of motion noted. Neurological: No deficits noted on motor, sensory and deep tendon reflex exam. Skin: No petechial rash or dryness.  Appeared moist.       Lab Results: Lab Results  Component Value Date   WBC 3.3 (L) 06/12/2022   HGB 11.2 (L) 06/12/2022   HCT 31.8 (L) 06/12/2022   MCV 87.6 06/12/2022   PLT 228 06/12/2022     Chemistry      Component Value Date/Time   NA 136 06/12/2022 0847   NA 139 08/05/2014 1335   K 4.4 06/12/2022 0847   K 4.3 08/05/2014 1335   CL 104 06/12/2022 0847   CO2 28 06/12/2022 0847   CO2 26 08/05/2014 1335   BUN 22 06/12/2022 0847   BUN 18.2 08/05/2014 1335   CREATININE 0.88 06/12/2022 0847   CREATININE 1.1 08/05/2014 1335      Component Value Date/Time   CALCIUM 8.1 (L) 06/12/2022 0847   CALCIUM 9.5 08/05/2014 1335   ALKPHOS 57 06/12/2022 0847   ALKPHOS 98 08/05/2014 1335   AST 34 06/12/2022 0847   AST 45 (H) 08/05/2014 1335   ALT 44 06/12/2022 0847   ALT 73 (H) 08/05/2014 1335   BILITOT 0.3 06/12/2022 0847   BILITOT 0.47 08/05/2014 1335           Impression and Plan:  71 year old man with:       1. Stage IV high-grade urothelial carcinoma of the bladder with pelvic adenopathy diagnosed in 2019.   He continues to do well tolerated the current therapy without any major complaints.  Risks and benefits of proceeding with treatment were discussed at this time.  Last imaging studies in October did not show any evidence of disease progression.  We will update his staging scans in January pending his tolerance.  He is agreeable to proceed at this time I will reduce the dose given his persistent diarrhea by 25%.  We will eliminate day 8 as well.  2. Pain: Related to malignancy and lymphedema.  Oxycodone is managing his pain at this time.   3.  IV  access: Port-A-Cath remains in use without any issues.  4.  Diarrhea: We have discussed strategies including using Imodium and Lomotil to counteract the symptoms.  This is related to his current treatment.  The dose reduction will also help in addition to eliminating day 8 of therapy.  5.  Follow-up: In 3 weeks for the next cycle of therapy.   30  minutes were spent on this encounter.  The time was dedicated to updating his disease status, treatment choices and addressing complications related to cancer and cancer therapy.  [name], MD 07/03/2022 8:21 AM

## 2022-07-03 NOTE — Patient Instructions (Signed)
[facility]  Discharge Instructions: Thank you for choosing [facility] to provide your oncology and hematology care.   If you have a lab appointment with the [facility], please go directly to the [facility] and check in at the registration area.   Wear comfortable clothing and clothing appropriate for easy access to any Portacath or PICC line.   We strive to give you quality time with your provider. You may need to reschedule your appointment if you arrive late (15 or more minutes).  Arriving late affects you and other patients whose appointments are after yours.  Also, if you miss three or more appointments without notifying the office, you may be dismissed from the clinic at the provider's discretion.      For prescription refill requests, have your pharmacy contact our office and allow 72 hours for refills to be completed.    Today you received the following chemotherapy and/or immunotherapy agents: [name]      To help prevent nausea and vomiting after your treatment, we encourage you to take your nausea medication as directed.  BELOW ARE SYMPTOMS THAT SHOULD BE REPORTED IMMEDIATELY: *FEVER GREATER THAN 100.4 F (38 C) OR HIGHER *CHILLS OR SWEATING *NAUSEA AND VOMITING THAT IS NOT CONTROLLED WITH YOUR NAUSEA MEDICATION *UNUSUAL SHORTNESS OF BREATH *UNUSUAL BRUISING OR BLEEDING *URINARY PROBLEMS (pain or burning when urinating, or frequent urination) *BOWEL PROBLEMS (unusual diarrhea, constipation, pain near the anus) TENDERNESS IN MOUTH AND THROAT WITH OR WITHOUT PRESENCE OF ULCERS (sore throat, sores in mouth, or a toothache) UNUSUAL RASH, SWELLING OR PAIN  UNUSUAL VAGINAL DISCHARGE OR ITCHING   Items with * indicate a potential emergency and should be followed up as soon as possible or go to the Emergency Department if any problems should occur.  Please show the CHEMOTHERAPY ALERT CARD or IMMUNOTHERAPY ALERT CARD at check-in to  the Emergency Department and triage nurse.  Should you have questions after your visit or need to cancel or reschedule your appointment, please contact [facility]  Dept: [phone]  and follow the prompts.  Office hours are 8:00 a.m. to 4:30 p.m. Monday - Friday. Please note that voicemails left after 4:00 p.m. may not be returned until the following business day.  We are closed weekends and major holidays. You have access to a nurse at all times for urgent questions. Please call the main number to the clinic Dept: [phone] and follow the prompts.   For any non-urgent questions, you may also contact your provider using MyChart. We now offer e-Visits for anyone 3 and older to request care online for non-urgent symptoms. For details visit [email or web address].   Also download the MyChart app! Go to the app store, search "MyChart", open the app, select [facility], and log in with your MyChart username and password.  Masks are optional in the cancer centers. If you would like for your care team to wear a mask while they are taking care of you, please let them know. You may have one support person who is at least 71 years old accompany you for your appointments.

## 2022-07-05 ENCOUNTER — Other Ambulatory Visit: Payer: Self-pay

## 2022-07-09 ENCOUNTER — Other Ambulatory Visit: Payer: Self-pay

## 2022-07-10 ENCOUNTER — Ambulatory Visit: Payer: Medicare Other

## 2022-07-10 ENCOUNTER — Inpatient Hospital Stay: Payer: Medicare Other | Admitting: Dietician

## 2022-07-10 ENCOUNTER — Other Ambulatory Visit: Payer: Medicare Other

## 2022-07-11 ENCOUNTER — Other Ambulatory Visit: Payer: Self-pay

## 2022-07-15 ENCOUNTER — Telehealth: Payer: Self-pay

## 2022-07-15 NOTE — Telephone Encounter (Signed)
T/C from pt requesting refills for his MS Contin 30 mg and Percocet 10/325 mg be sent to [facility]  on [address]

## 2022-07-16 ENCOUNTER — Other Ambulatory Visit: Payer: Self-pay | Admitting: Oncology

## 2022-07-16 DIAGNOSIS — C679 Malignant neoplasm of bladder, unspecified: Secondary | ICD-10-CM

## 2022-07-16 MED ORDER — OXYCODONE-ACETAMINOPHEN 10-325 MG PO TABS: 1.0000 | ORAL_TABLET | ORAL | 0 refills | Status: DC | PRN

## 2022-07-16 MED ORDER — MORPHINE SULFATE ER 30 MG PO TBCR: 30.0000 mg | EXTENDED_RELEASE_TABLET | Freq: Two times a day (BID) | ORAL | 0 refills | Status: DC

## 2022-07-20 ENCOUNTER — Other Ambulatory Visit: Payer: Self-pay

## 2022-07-24 ENCOUNTER — Other Ambulatory Visit: Payer: Medicare Other

## 2022-07-24 ENCOUNTER — Ambulatory Visit: Payer: Medicare Other | Admitting: Oncology

## 2022-07-24 ENCOUNTER — Ambulatory Visit: Payer: Medicare Other

## 2022-07-27 ENCOUNTER — Other Ambulatory Visit: Payer: Self-pay

## 2022-08-01 ENCOUNTER — Other Ambulatory Visit: Payer: Self-pay

## 2022-08-01 MED FILL — Dexamethasone Sodium Phosphate Inj 100 MG/10ML: INTRAMUSCULAR | Qty: 1 | Status: AC

## 2022-08-01 MED FILL — Fosaprepitant Dimeglumine For IV Infusion 150 MG (Base Eq): INTRAVENOUS | Qty: 5 | Status: AC

## 2022-08-02 ENCOUNTER — Inpatient Hospital Stay: Payer: Medicare Other | Attending: Oncology | Admitting: Dietician

## 2022-08-02 ENCOUNTER — Inpatient Hospital Stay: Payer: Medicare Other

## 2022-08-02 ENCOUNTER — Other Ambulatory Visit: Payer: Self-pay

## 2022-08-02 ENCOUNTER — Inpatient Hospital Stay (HOSPITAL_BASED_OUTPATIENT_CLINIC_OR_DEPARTMENT_OTHER): Payer: Medicare Other | Admitting: Oncology

## 2022-08-02 VITALS — BP 115/66 | HR 72 | Temp 97.8°F | Resp 16 | Ht 74.0 in | Wt 172.5 lb

## 2022-08-02 DIAGNOSIS — C670 Malignant neoplasm of trigone of bladder: Secondary | ICD-10-CM | POA: Diagnosis not present

## 2022-08-02 DIAGNOSIS — R590 Localized enlarged lymph nodes: Secondary | ICD-10-CM | POA: Insufficient documentation

## 2022-08-02 DIAGNOSIS — C679 Malignant neoplasm of bladder, unspecified: Secondary | ICD-10-CM

## 2022-08-02 DIAGNOSIS — R197 Diarrhea, unspecified: Secondary | ICD-10-CM | POA: Diagnosis not present

## 2022-08-02 DIAGNOSIS — M7989 Other specified soft tissue disorders: Secondary | ICD-10-CM | POA: Diagnosis not present

## 2022-08-02 DIAGNOSIS — R634 Abnormal weight loss: Secondary | ICD-10-CM | POA: Insufficient documentation

## 2022-08-02 DIAGNOSIS — G893 Neoplasm related pain (acute) (chronic): Secondary | ICD-10-CM | POA: Diagnosis not present

## 2022-08-02 DIAGNOSIS — Z79899 Other long term (current) drug therapy: Secondary | ICD-10-CM | POA: Diagnosis not present

## 2022-08-02 DIAGNOSIS — K521 Toxic gastroenteritis and colitis: Secondary | ICD-10-CM | POA: Insufficient documentation

## 2022-08-02 DIAGNOSIS — R112 Nausea with vomiting, unspecified: Secondary | ICD-10-CM | POA: Insufficient documentation

## 2022-08-02 DIAGNOSIS — R102 Pelvic and perineal pain: Secondary | ICD-10-CM | POA: Diagnosis not present

## 2022-08-02 DIAGNOSIS — Z515 Encounter for palliative care: Secondary | ICD-10-CM | POA: Insufficient documentation

## 2022-08-02 DIAGNOSIS — Z5112 Encounter for antineoplastic immunotherapy: Secondary | ICD-10-CM | POA: Diagnosis present

## 2022-08-02 DIAGNOSIS — I890 Lymphedema, not elsewhere classified: Secondary | ICD-10-CM | POA: Diagnosis not present

## 2022-08-02 LAB — CBC WITH DIFFERENTIAL (CANCER CENTER ONLY)
Abs Immature Granulocytes: 0.02 10*3/uL (ref 0.00–0.07)
Basophils Absolute: 0 10*3/uL (ref 0.0–0.1)
Basophils Relative: 1 %
Eosinophils Absolute: 0.1 10*3/uL (ref 0.0–0.5)
Eosinophils Relative: 1 %
HCT: 37.7 % — ABNORMAL LOW (ref 39.0–52.0)
Hemoglobin: 13.1 g/dL (ref 13.0–17.0)
Immature Granulocytes: 0 %
Lymphocytes Relative: 18 %
Lymphs Abs: 1.3 10*3/uL (ref 0.7–4.0)
MCH: 30.2 pg (ref 26.0–34.0)
MCHC: 34.7 g/dL (ref 30.0–36.0)
MCV: 86.9 fL (ref 80.0–100.0)
Monocytes Absolute: 0.6 10*3/uL (ref 0.1–1.0)
Monocytes Relative: 8 %
Neutro Abs: 5.1 10*3/uL (ref 1.7–7.7)
Neutrophils Relative %: 72 %
Platelet Count: 236 10*3/uL (ref 150–400)
RBC: 4.34 MIL/uL (ref 4.22–5.81)
RDW: 12.2 % (ref 11.5–15.5)
WBC Count: 7.1 10*3/uL (ref 4.0–10.5)
nRBC: 0 % (ref 0.0–0.2)

## 2022-08-02 LAB — CMP (CANCER CENTER ONLY)
ALT: 22 U/L (ref 0–44)
AST: 26 U/L (ref 15–41)
Albumin: 3.3 g/dL — ABNORMAL LOW (ref 3.5–5.0)
Alkaline Phosphatase: 61 U/L (ref 38–126)
Anion gap: 4 — ABNORMAL LOW (ref 5–15)
BUN: 21 mg/dL (ref 8–23)
CO2: 28 mmol/L (ref 22–32)
Calcium: 8.5 mg/dL — ABNORMAL LOW (ref 8.9–10.3)
Chloride: 105 mmol/L (ref 98–111)
Creatinine: 0.81 mg/dL (ref 0.61–1.24)
GFR, Estimated: >60 mL/min (ref >60)
Glucose, Bld: 158 mg/dL — ABNORMAL HIGH (ref 70–99)
Potassium: 4 mmol/L (ref 3.5–5.1)
Sodium: 137 mmol/L (ref 135–145)
Total Bilirubin: 0.3 mg/dL (ref 0.3–1.2)
Total Protein: 5.8 g/dL — ABNORMAL LOW (ref 6.5–8.1)

## 2022-08-02 LAB — MAGNESIUM: Magnesium: 1.1 mg/dL — ABNORMAL LOW (ref 1.7–2.4)

## 2022-08-02 LAB — PHOSPHORUS: Phosphorus: 3.4 mg/dL (ref 2.5–4.6)

## 2022-08-02 MED ORDER — ATROPINE SULFATE 1 MG/ML IV SOLN
0.5000 mg | Freq: Once | INTRAVENOUS | Status: AC | PRN
  Administered 2022-08-02: 0.5 mg via INTRAVENOUS
  Filled 2022-08-02: qty 1

## 2022-08-02 MED ORDER — SODIUM CHLORIDE 0.9 % IV SOLN
150.0000 mg | Freq: Once | INTRAVENOUS | Status: AC
  Administered 2022-08-02: 150 mg via INTRAVENOUS
  Filled 2022-08-02: qty 150

## 2022-08-02 MED ORDER — MAGNESIUM SULFATE 2 GM/50ML IV SOLN
2.0000 g | Freq: Once | INTRAVENOUS | Status: AC
  Administered 2022-08-02: 2 g via INTRAVENOUS
  Filled 2022-08-02: qty 50

## 2022-08-02 MED ORDER — SODIUM CHLORIDE 0.9% FLUSH
10.0000 mL | INTRAVENOUS | Status: DC | PRN
  Administered 2022-08-02: 10 mL

## 2022-08-02 MED ORDER — SODIUM CHLORIDE 0.9 % IV SOLN
10.0000 mg | Freq: Once | INTRAVENOUS | Status: AC
  Administered 2022-08-02: 10 mg via INTRAVENOUS
  Filled 2022-08-02: qty 10

## 2022-08-02 MED ORDER — SODIUM CHLORIDE 0.9 % IV SOLN: Freq: Once | INTRAVENOUS | Status: AC

## 2022-08-02 MED ORDER — ACETAMINOPHEN 325 MG PO TABS
650.0000 mg | ORAL_TABLET | Freq: Once | ORAL | Status: AC
  Administered 2022-08-02: 650 mg via ORAL
  Filled 2022-08-02: qty 2

## 2022-08-02 MED ORDER — HEPARIN SOD (PORK) LOCK FLUSH 100 UNIT/ML IV SOLN
500.0000 [IU] | Freq: Once | INTRAVENOUS | Status: AC | PRN
  Administered 2022-08-02: 500 [IU]

## 2022-08-02 MED ORDER — SODIUM CHLORIDE 0.9 % IV SOLN
540.0000 mg | Freq: Once | INTRAVENOUS | Status: AC
  Administered 2022-08-02: 540 mg via INTRAVENOUS
  Filled 2022-08-02: qty 54

## 2022-08-02 MED ORDER — MORPHINE SULFATE ER 30 MG PO TBCR: 30.0000 mg | EXTENDED_RELEASE_TABLET | Freq: Three times a day (TID) | ORAL | 0 refills | Status: DC

## 2022-08-02 MED ORDER — FAMOTIDINE IN NACL 20-0.9 MG/50ML-% IV SOLN
20.0000 mg | Freq: Once | INTRAVENOUS | Status: AC
  Administered 2022-08-02: 20 mg via INTRAVENOUS
  Filled 2022-08-02: qty 50

## 2022-08-02 MED ORDER — PALONOSETRON HCL INJECTION 0.25 MG/5ML
0.2500 mg | Freq: Once | INTRAVENOUS | Status: AC
  Administered 2022-08-02: 0.25 mg via INTRAVENOUS
  Filled 2022-08-02: qty 5

## 2022-08-02 MED ORDER — DIPHENHYDRAMINE HCL 50 MG/ML IJ SOLN
50.0000 mg | Freq: Once | INTRAMUSCULAR | Status: AC
  Administered 2022-08-02: 50 mg via INTRAVENOUS
  Filled 2022-08-02: qty 1

## 2022-08-02 NOTE — Progress Notes (Signed)
Nutrition Follow-up:  Patient with bladder cancer. Lynparza discontinued July 2023 due to progression. He is currently receiving reduced dose Trodelvy q21d.   Met with patient during infusion. He is feeling sleepy from IV Benadryl. Patient reports appetite has been great. He is eating 2 large meals daily. Recalls 1/2 rack of ribs, baked potato with butter and extra sour cream, baked beans, garlic toast for lunch, spaghetti and meatballs for supper last night. He is eating bowl of ice cream before bed. Patient has one protein bar mid morning. He is drinking throughout the day. Patient says he is no longer having diarrhea. He denies nausea, vomiting, constipation.   Medications: lomotil, imodium, ms contin, percocet  Labs: Mg 1.1, glucose 158, albumin 3.3  Anthropometrics: Wt 172 lb 8 oz today stable   11/1 - 173 lb 8 oz 10/11 - 166 lb 8 oz 9/20 - 167 lb   NUTRITION DIAGNOSIS: Unintended weight loss stable     INTERVENTION:  Continue eating high calorie high protein foods to promote weight gain Continue bowl of ice cream/shake for bedtime snack    MONITORING, EVALUATION, GOAL: weight trends, intake   NEXT VISIT: To be scheduled with treatment as needed

## 2022-08-02 NOTE — Progress Notes (Signed)
Hematology and Oncology Follow Up  Rasmus Preusser Grunow [id number] 1951-03-22 71 y.o. 08/02/2022 9:00 AM [name], MDShadad, Mathis Dad, MD      Principle Diagnosis:  63 year old man with bladder cancer diagnosed in 2015.  He developed stage IV high-grade urothelial carcinoma with pelvic adenopathy in 2019.  NexGen ration sequencing did not show any actionable mutation including FGFR.   Prior Therapy:  He underwent a cystoscopy and a TURBT on 05/17/2014.    Neoadjuvant systemic chemotherapy in the form of gemcitabine and cisplatin cycle 1 day 1 is on 06/17/2014. He is S/P two cycles completed in 07/2014.  Therapy tolerated poorly with symptoms of nausea and vomiting and worsening renal function.   He is status post robotic cystoprostatectomy and bilateral lymphadenectomy completed on September 09, 2014.  The final pathology revealed T2N0 without any lymph node involvement.   He developed pelvic adenopathy that was biopsy-proven on Jan 14, 2018 to be metastatic urothelial carcinoma.   Carboplatin and gemcitabine cycle 1 started on 02/17/2018.  He completed 8 cycles of therapy in December 2019.   Pembrolizumab 200 mg every 3 weeks started on April 01, 2019.  He is status post 7 cycles of therapy in December 2020.  Therapy stopped because of of patient preference, excellent clinical response and treatment holiday.  He developed progression of disease in June 2021.   Padcev started on February 11, 2020.  He has been receiving monthly maintenance therapy up till March 2023.  He developed disease progression at that time.  [id number] analysis showed an ATM mutation as well as ERBB2 mutation.  Lynparza 300 mg twice a day started on December 26, 2021.  Therapy discontinued in July 2023 for progression of disease.       Current therapy:  Sacituzumab govitecan started on 9 March 12, 2022.  He is here for day 1 cycle 7 of therapy.          Interim History: Mr. Held returns today for repeat  evaluation.  Since last visit, he continues to have issues with lymphedema and swelling in his right leg.  He is currently on morphine although he has been using Percocet more often especially middle of the day.  Denies any nausea, vomiting or diarrhea.  His performance status quality of life remains stable.   Medications: Updated on review. Current Outpatient Medications  Medication Sig Dispense Refill   Clotrimazole 1 % LOTN Apply 1 Application topically 2 (two) times daily as needed. 30 mL 0   diphenoxylate-atropine (LOMOTIL) 2.5-0.025 MG tablet 1 to 2 PO QID prn diarrhea 45 tablet 2   furosemide (LASIX) 20 MG tablet Take 2 tablets (40 mg total) by mouth daily. (Patient not taking: Reported on 04/24/2022) 90 tablet 1   loperamide (IMODIUM) 2 MG capsule Take 1 capsule (2 mg total) by mouth as needed for diarrhea or loose stools. 60 capsule 1   morphine (MS CONTIN) 30 MG 12 hr tablet Take 1 tablet (30 mg total) by mouth every 12 (twelve) hours. 60 tablet 0   nystatin (MYCOSTATIN/NYSTOP) powder Apply 1 Application topically 3 (three) times daily. 30 g 0   oxyCODONE-acetaminophen (PERCOCET) 10-325 MG tablet Take 1 tablet by mouth every 4 (four) hours as needed for pain. 90 tablet 0   No current facility-administered medications for this visit.   Physical exam  Blood pressure 115/66, pulse 72, temperature 97.8 F (36.6 C), temperature source Temporal, resp. rate 16, height _0  (1.88 m), weight 172 lb 8 oz (78.2 kg), SpO2  99 %.    ECOG 1   General appearance: Alert, awake without any distress. Head: Atraumatic without abnormalities Oropharynx: Without any thrush or ulcers. Eyes: No scleral icterus. Lymph nodes: No lymphadenopathy noted in the cervical, supraclavicular, or axillary nodes Heart:regular rate and rhythm, without any murmurs or gallops.   Lung: Clear to auscultation without any rhonchi, wheezes or dullness to percussion. Abdomin: Soft, nontender without any shifting  dullness or ascites. Musculoskeletal: No clubbing or cyanosis. Neurological: No motor or sensory deficits. Skin: No rashes or lesions.      Lab Results: Lab Results  Component Value Date   WBC 7.1 08/02/2022   HGB 13.1 08/02/2022   HCT 37.7 (L) 08/02/2022   MCV 86.9 08/02/2022   PLT 236 08/02/2022     Chemistry      Component Value Date/Time   NA 136 07/03/2022 0909   NA 139 08/05/2014 1335   K 4.0 07/03/2022 0909   K 4.3 08/05/2014 1335   CL 106 07/03/2022 0909   CO2 27 07/03/2022 0909   CO2 26 08/05/2014 1335   BUN 19 07/03/2022 0909   BUN 18.2 08/05/2014 1335   CREATININE 0.80 07/03/2022 0909   CREATININE 1.1 08/05/2014 1335      Component Value Date/Time   CALCIUM 8.4 (L) 07/03/2022 0909   CALCIUM 9.5 08/05/2014 1335   ALKPHOS 58 07/03/2022 0909   ALKPHOS 98 08/05/2014 1335   AST 28 07/03/2022 0909   AST 45 (H) 08/05/2014 1335   ALT 34 07/03/2022 0909   ALT 73 (H) 08/05/2014 1335   BILITOT 0.3 07/03/2022 0909   BILITOT 0.47 08/05/2014 1335           Impression and Plan:  71 year old man with:       1.  Bladder cancer diagnosed in 2015.  He developed stage IV high-grade urothelial carcinoma with pelvic adenopathy in 2019.  The natural course of his disease was reviewed at this time and treatment options were reiterated.  He has progressed on multiple therapies and likely this treatment will offer very little palliation.  If he continues to progress beyond the current treatment taxane chemotherapy versus transitioning to hospice would be considered.  2. Pain: Related to his pelvic malignancy and lymphadenopathy.  I will increase his morphine to 30 mg 3 times a day and continue to use Percocet as needed.   3.  IV access: Port-A-Cath continues to be in use at this time without any issues.  4.  Diarrhea: Improved with dose reduction of the current treatment.  Continues to be on Imodium and Lomotil.  5.  Lymphedema: Related to bulky adenopathy and  remains his most likely detriment to his quality of life.  Will refer him to radiation oncology for an evaluation and possible palliative radiation to the right nodal pelvic mass and attempt to alleviate his lymphedema.  6.  Weight loss: We discussed strategies to improve his appetite.  Nutritional consult scheduled today.   7.  Prognosis and goals of care: His disease is incurable and likely progressing.  His performance status is adequate but likely will decline in the future.  His prognosis is overall guarded with limited life expectancy.  Transitioning to hospice at some point would be necessary.  8.  Follow-up: He will return in 3 weeks for the next cycle of therapy.   30  minutes were dedicated to this visit.  The time was spent on updating disease status, treatment choices and outlining future plan of care review.  [name], MD 08/02/2022 9:00 AM

## 2022-08-02 NOTE — Progress Notes (Signed)
Okay to treat with Mag 1.1 and phos pending per Dr. Alen Blew. Will replace with 2g Mag.

## 2022-08-03 ENCOUNTER — Other Ambulatory Visit: Payer: Self-pay

## 2022-08-05 ENCOUNTER — Other Ambulatory Visit: Payer: Self-pay

## 2022-08-06 ENCOUNTER — Telehealth: Payer: Self-pay | Admitting: Radiation Oncology

## 2022-08-06 NOTE — Telephone Encounter (Signed)
Left message to call back to schedule an appointment per 11/30 referral.

## 2022-08-07 ENCOUNTER — Other Ambulatory Visit: Payer: Self-pay

## 2022-08-08 ENCOUNTER — Other Ambulatory Visit: Payer: Self-pay | Admitting: *Deleted

## 2022-08-08 DIAGNOSIS — C679 Malignant neoplasm of bladder, unspecified: Secondary | ICD-10-CM

## 2022-08-08 MED ORDER — OXYCODONE-ACETAMINOPHEN 10-325 MG PO TABS: 1.0000 | ORAL_TABLET | ORAL | 0 refills | Status: DC | PRN

## 2022-08-08 NOTE — Progress Notes (Signed)
[facility] Location of Tumor / Histology: Bladder Ca (2015).  Developed stage IV high-grade urothelial carcinoma with pelvic adenopathy in 2019.   06/03/2022  Dr. Alen Blew CT Chest Abdomen Pelvis with Contrast CLINICAL DATA:  Invasive bladder cancer, assess treatment response in a 71 year old male.  IMPRESSION: 1. Slight interval increase in size of central mesenteric and retroperitoneal nodal disease. Some of this increase could be accentuated by changes in mesenteric position but there is enlargement of LEFT retroperitoneal nodal disease albeit slight. No signs of new disease. 2. Post cystectomy and prostatectomy with RIGHT lower quadrant ileal conduit diversion. 3. Edema in fascial planes of the pelvis and in subcutaneous fat extending into penis and scrotum is similar to previous imaging. 4. Apical nodularity in the RIGHT upper lobe is similar to PET imaging dating back to December of 2022, dominant area measuring 9 mm previously 9-10 mm. 5. Aortic atherosclerosis.   03/04/2022 Dr. Alen Blew CT Chest  Abdomen Pelvis with or without Contrast CLINICAL DATA:  Invasive bladder cancer status post cystoprostatectomy. Assess treatment response.  * Tracking Code: BO *  IMPRESSION: 1. Interval progression of nodal metastatic disease in the mesentery, retroperitoneum and right pelvis. 2. No evidence of metastatic disease in the chest. 3. Scattered indistinct pulmonary nodules at the right greater than left lung apices are stable from 07/09/2021 chest CT, probably benign favoring postinfectious change. 4. No evidence of complication involving the ileal conduit urinary diversion. 5. Aortic Atherosclerosis (ICD10-I70.0).  Past/Anticipated interventions by urology, if any: NA  Past/Anticipated interventions by medical oncology, if any:   Dr. Alen Blew  PLAN: 1.  Bladder cancer diagnosed in 2015.  He developed stage IV high-grade urothelial carcinoma with pelvic adenopathy in 2019.   The natural course of his  disease was reviewed at this time and treatment options were reiterated.  He has progressed on multiple therapies and likely this treatment will offer very little palliation.  If he continues to progress beyond the current treatment taxane chemotherapy versus transitioning to hospice would be considered.   2. Pain: Related to his pelvic malignancy and lymphadenopathy.  I will increase his morphine to 30 mg 3 times a day and continue to use Percocet as needed.    3.  IV access: Port-A-Cath continues to be in use at this time without any issues.   4.  Diarrhea: Improved with dose reduction of the current treatment.  Continues to be on Imodium and Lomotil.   5.  Lymphedema: Related to bulky adenopathy and remains his most likely detriment to his quality of life.  Will refer him to radiation oncology for an evaluation and possible palliative radiation to the right nodal pelvic mass and attempt to alleviate his lymphedema.   6.  Weight loss: We discussed strategies to improve his appetite.  Nutritional consult scheduled today.    7.  Prognosis and goals of care: His disease is incurable and likely progressing.  His performance status is adequate but likely will decline in the future.  His prognosis is overall guarded with limited life expectancy.  Transitioning to hospice at some point would be necessary.   8.  Follow-up: He will return in 3 weeks for the next cycle of therapy.  Weight changes, if any:  No, stable  Bowel/Bladder complaints, if any:  Diarrhea is on lomotil/Imodium.  Denies discomfort with urination.  Has a urinary catheter.  Nausea/Vomiting, if any:  No  Pain issues, if any:   6-7/10 stomach pain hurts when breathing and right leg neuropathy discomfort.  SAFETY ISSUES: Prior radiation?  No Pacemaker/ICD?  No Possible current pregnancy? Male Is the patient on methotrexate? No  Current Complaints / other details:  Possible equilibrium issues will seeing white light or flashes  and know he may fall so he tries to brace himself in preparation of a fall.  Doesn't use a cane or walker but still some issues with gait stability.

## 2022-08-09 ENCOUNTER — Other Ambulatory Visit: Payer: Self-pay

## 2022-08-12 ENCOUNTER — Ambulatory Visit
Admission: RE | Admit: 2022-08-12 | Discharge: 2022-08-12 | Disposition: A | Discharge status: Home / Self Care | Payer: Medicare Other | Source: Ambulatory Visit | Attending: Radiation Oncology | Admitting: Radiation Oncology

## 2022-08-12 ENCOUNTER — Other Ambulatory Visit: Payer: Self-pay

## 2022-08-12 VITALS — BP 107/67 | HR 83 | Temp 97.7°F | Resp 18 | Ht 74.0 in | Wt 167.2 lb

## 2022-08-12 DIAGNOSIS — M7989 Other specified soft tissue disorders: Secondary | ICD-10-CM | POA: Insufficient documentation

## 2022-08-12 DIAGNOSIS — M25561 Pain in right knee: Secondary | ICD-10-CM | POA: Insufficient documentation

## 2022-08-12 DIAGNOSIS — I890 Lymphedema, not elsewhere classified: Secondary | ICD-10-CM | POA: Insufficient documentation

## 2022-08-12 DIAGNOSIS — C679 Malignant neoplasm of bladder, unspecified: Secondary | ICD-10-CM

## 2022-08-12 DIAGNOSIS — Z79899 Other long term (current) drug therapy: Secondary | ICD-10-CM | POA: Diagnosis not present

## 2022-08-12 DIAGNOSIS — Z87891 Personal history of nicotine dependence: Secondary | ICD-10-CM | POA: Diagnosis not present

## 2022-08-12 DIAGNOSIS — Z800 Family history of malignant neoplasm of digestive organs: Secondary | ICD-10-CM | POA: Insufficient documentation

## 2022-08-12 DIAGNOSIS — R102 Pelvic and perineal pain: Secondary | ICD-10-CM | POA: Insufficient documentation

## 2022-08-12 DIAGNOSIS — Z1509 Genetic susceptibility to other malignant neoplasm: Secondary | ICD-10-CM | POA: Insufficient documentation

## 2022-08-12 DIAGNOSIS — C775 Secondary and unspecified malignant neoplasm of intrapelvic lymph nodes: Secondary | ICD-10-CM | POA: Diagnosis not present

## 2022-08-12 DIAGNOSIS — C670 Malignant neoplasm of trigone of bladder: Secondary | ICD-10-CM | POA: Insufficient documentation

## 2022-08-12 NOTE — Progress Notes (Signed)
[facility]         [phone] ________________________________  Initial Outpatient Consultation  Name: Danny Wood MRN: [id number]  Date: 08/12/2022  DOB: [birth year]  REFERRING PHYSICIAN: Wyatt Portela, MD  DIAGNOSIS: 71 yo man with pain and right leg lymphedema from metastatic lymphadenopathy of right iliac and paraaortic nodes from urothelial cancer of the trigone of the bladder.    ICD-10-CM   1. Cancer of trigone of urinary bladder (HCC)  C67.0     2. Bladder cancer metastasized to intra-abdominal lymph nodes ([place])  C67.9    C77.2       HISTORY OF PRESENT ILLNESS::Danny Wood is a 71 y.o. male with bladder cancer diagnosed in 2015. He underwent a cystoprostatectomy and bilateral lymphadenectomy completed on September 09, 2014. The final pathology revealed T2N0 without any lymph node involvement. He developed pelvic adenopathy that was biopsy-proven on Jan 14, 2018 to be metastatic urothelial carcinoma. He completed 8 cycles of chemotherapy in December 2019. He was started on Pembrolizumab in July of 2020 and stopped in December of 2020 due to patient preference, excellent clinical response, and for a treatment holiday.   CT of the abdomen and pelvis on 02/08/20 unfortunately showed worsening retroperitoneal and pelvic adenopathy compatible with progressive malignancy; along with progressive mesenteric lesions adjacent to the celiac trunk and the distal SMA also compatible with malignancy. He was started on Padcev and received monthly maintenance therapy until March 2023 when he developed further disease progression at that time. Guardant 360 analysis showed a heterozygous ATM mutation as well as ERBB2 mutation. Further chemotherapy was started in April of 2023. He switched regimens in July of 2023 due to disease progression. Most recent imaging on 06/04/22 showed a slight interval increase in size of central mesenteric and retroperitoneal nodal  disease on CT of C/A/P.   He most recently saw Dr. Alen Blew on 08/02/22  for repeat evaluation before his 7th cycle of his most recent chemotherapy regimen. He complained of ongoing pain while on 30 mg morphine BID and using Percocet for breakthrough pain. He also complained of ongoing lymphedema and swelling his right leg. He was kindly referred to radiation oncology for an evaluation and possible palliative radiation to the right nodal pelvic mass and attempt to alleviate his lymphedema.   Today, he states the lymphedema and pain has worsened recently. He has had mild lymphedema in his right leg for a couple years, but it has recently worsened. He is also experiencing a significant amount of pain behind his right knee and in his suprapubic region. He is sometimes unable to move due to the pain. He confirms that he is taking 30 mg morphine BID and Percocet PRN.   PREVIOUS RADIATION THERAPY: No  Past Medical History:  Diagnosis Date   At risk for sleep apnea    STOP-BANG= 4       SENT TO PCP 05-16-2014   bladder ca dx'd 05/17/14   met dz 12/2017   Bladder disorder    Hematuria    History of renal cell carcinoma    2003  S/P  PARTIAL RIGHT NEPHRECTOMY   Presence of surgical incision    lumbar diskectomy 05-11-2014-  bandage present   Urgency of urination   :   Past Surgical History:  Procedure Laterality Date   CYSTOSCOPY N/A 09/09/2014   Procedure: CYSTOSCOPY WITH INDOCYANINE GREEN DYE INJECTION AND URETHRAL DILATION;  Surgeon: Alexis Frock, MD;  Location: [facility];  Service: Urology;  Laterality: N/A;   CYSTOSCOPY WITH BIOPSY N/A 05/17/2014   Procedure: CYSTO WITH BLADDER BIOPSY;  Surgeon: Malka So, MD;  Location: [facility];  Service: Urology;  Laterality: N/A;   EVALUATION UNDER ANESTHESIA WITH HEMORRHOIDECTOMY  07/07/2012   Procedure: EXAM UNDER ANESTHESIA WITH HEMORRHOIDECTOMY;  Surgeon: Joyice Faster. Cornett, MD;  Location: [facility];  Service: General;  Laterality: N/A;    IR IMAGING GUIDED PORT INSERTION  02/20/2018   LUMBAR MICRODISCECTOMY  05-11-2014   L4 -- L5 (partial)   PARTIAL NEPHRECTOMY Right 2003   ROBOT ASSISTED LAPAROSCOPIC COMPLETE CYSTECT ILEAL CONDUIT N/A 09/09/2014   Procedure: ROBOTIC ASSISTED LAPAROSCOPIC COMPLETE CYSTOPROSTATECTOMY,  ILEAL CONDUIT;  Surgeon: Alexis Frock, MD;  Location: [facility];  Service: Urology;  Laterality: N/A;  :   Current Outpatient Medications:    Clotrimazole 1 % LOTN, Apply 1 Application topically 2 (two) times daily as needed., Disp: 30 mL, Rfl: 0   diphenoxylate-atropine (LOMOTIL) 2.5-0.025 MG tablet, 1 to 2 PO QID prn diarrhea, Disp: 45 tablet, Rfl: 2   furosemide (LASIX) 20 MG tablet, Take 2 tablets (40 mg total) by mouth daily. (Patient not taking: Reported on 04/24/2022), Disp: 90 tablet, Rfl: 1   loperamide (IMODIUM) 2 MG capsule, Take 1 capsule (2 mg total) by mouth as needed for diarrhea or loose stools., Disp: 60 capsule, Rfl: 1   meloxicam (MOBIC) 15 MG tablet, , Disp: , Rfl:    morphine (MS CONTIN) 30 MG 12 hr tablet, Take 1 tablet (30 mg total) by mouth every 8 (eight) hours., Disp: 90 tablet, Rfl: 0   nystatin (MYCOSTATIN/NYSTOP) powder, Apply 1 Application topically 3 (three) times daily., Disp: 30 g, Rfl: 0   oxyCODONE-acetaminophen (PERCOCET) 10-325 MG tablet, Take 1 tablet by mouth every 4 (four) hours as needed for pain., Disp: 90 tablet, Rfl: 0:  No Known Allergies:   Family History  Problem Relation Age of Onset   Cancer Father        unk type   Colon cancer Maternal Uncle    Diabetes Neg Hx   :   Social History   Socioeconomic History   Marital status: Married    Spouse name: Not on file   Number of children: Not on file   Years of education: Not on file   Highest education level: Not on file  Occupational History   Not on file  Tobacco Use   Smoking status: Former    Packs/day: 0.50    Years: 25.00    Total pack years: 12.50    Types: Cigarettes    Quit date: 06/02/2004     Years since quitting: 18.2   Smokeless tobacco: Never  Substance and Sexual Activity   Alcohol use: Yes    Alcohol/week: 2.0 standard drinks of alcohol    Types: 2 Standard drinks or equivalent per week   Drug use: No   Sexual activity: Not on file  Other Topics Concern   Not on file  Social History Narrative   Not on file   Social Determinants of Health   Financial Resource Strain: Not on file  Food Insecurity: No Food Insecurity (05/08/2022)   Hunger Vital Sign    Worried About Running Out of Food in the Last Year: Never true    Ran Out of Food in the Last Year: Never true  Transportation Needs: Not on file  Physical Activity: Not on file  Stress: Not on file  Social Connections: Not on file  Intimate Partner  Violence: Not on file  :  REVIEW OF SYSTEMS:  A 15 point review of systems is documented in the electronic medical record. This was obtained by the nursing staff. However, I reviewed this with the patient to discuss relevant findings and make appropriate changes. Positive for worsening suprapubic and popliteal fossa pain. Positive for some hardened stools. A complete review of systems is obtained and is otherwise negative.    PHYSICAL EXAM:  Blood pressure 107/67, pulse 83, temperature 97.7 F (36.5 C), temperature source Oral, resp. rate 18, height _0  (1.88 m), weight 167 lb 4 oz (75.9 kg), SpO2 100 %. In general this is a well appearing male gentleman in no acute distress. He's alert and oriented x4 and appropriate throughout the examination. Cardiopulmonary assessment is negative for acute distress and he exhibits normal effort.   Significant non pitting edema present throughout the entire right leg.  Normoactive bowel sounds. Abdomen is non tender to palpation. No masses visualized or palpated.   KPS = 70  100 - Normal; no complaints; no evidence of disease. 90   - Able to carry on normal activity; minor signs or symptoms of disease. 80   - Normal activity with  effort; some signs or symptoms of disease. 22   - Cares for self; unable to carry on normal activity or to do active work. 60   - Requires occasional assistance, but is able to care for most of his personal needs. 50   - Requires considerable assistance and frequent medical care. 40   - Disabled; requires special care and assistance. 30   - Severely disabled; hospital admission is indicated although death not imminent. 39   - Very sick; hospital admission necessary; active supportive treatment necessary. 10   - Moribund; fatal processes progressing rapidly. 0     - Dead  Karnofsky DA, Abelmann Cody, Craver LS and Burchenal [facility] [phone]) The use of the nitrogen mustards in the palliative treatment of carcinoma: with particular reference to bronchogenic carcinoma Cancer 1 [phone]  LABORATORY DATA:  Lab Results  Component Value Date   WBC 7.1 08/02/2022   HGB 13.1 08/02/2022   HCT 37.7 (L) 08/02/2022   MCV 86.9 08/02/2022   PLT 236 08/02/2022   Lab Results  Component Value Date   NA 137 08/02/2022   K 4.0 08/02/2022   CL 105 08/02/2022   CO2 28 08/02/2022   Lab Results  Component Value Date   ALT 22 08/02/2022   AST 26 08/02/2022   ALKPHOS 61 08/02/2022   BILITOT 0.3 08/02/2022     RADIOGRAPHY: Dr. Tammi Klippel personally reviewed the imaging.     IMPRESSION: 71 year old man with bladder cancer diagnosed in 2015.  He developed stage IV high-grade urothelial carcinoma with pelvic adenopathy in 2019.   Today we discussed the workup and natural course of stage IV bladder disease. Most recent imaging indicates progression of nodal metastatic disease in the mesentery, retroperitoneum, and right pelvis. Patient is experiencing worsening lymphedema and pain due to progression of disease. He is a good candidate for palliative radiotherapy to decrease his symptoms and improve quality of life. Accordingly, I recommend 30 Gy in 10 fractions to the pelvis. We discussed the available radiation  techniques, and focused on the details and logistics of delivery. We discussed and outlined the risks, benefits, short and long-term effects associated with radiotherapy. He was encouraged to ask questions that were answered to his stated satisfaction. Patient is enthusiastic about proceeding with treatment. A consent form  was signed today and patient was scheduled for CT simulation tomorrow afternoon.   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 They recommended testing his children to further evaluate. His children initially were not interested in getting tested, but after further discussion of the risks associated of the syndrome at today's visit, patient would like to talk to his children again.   PLAN: Patient is interested in moving forward with 2 weeks of palliative radiation therapy to the pelvis. CT simulation scheduled for tomorrow afternoon. We will tentatively plan to begin radiotherapy later this week. Genetics will reach back out to the family in a couple days to re-discuss further testing.   We personally spent 60 minutes minutes face to face with the patient and more than 50% of that time was spent in counseling and/or coordination of care.   ------------------------------------------------   Leona Singleton, PA-C    Tyler Pita, MD  [place]: [phone]  Fax: [phone] [email or web address]  Skype  LinkedIn    Page Me

## 2022-08-13 ENCOUNTER — Other Ambulatory Visit: Payer: Self-pay | Admitting: *Deleted

## 2022-08-13 ENCOUNTER — Ambulatory Visit
Admission: RE | Admit: 2022-08-13 | Discharge: 2022-08-13 | Disposition: A | Discharge status: Home / Self Care | Payer: Medicare Other | Source: Ambulatory Visit | Attending: Radiation Oncology | Admitting: Radiation Oncology

## 2022-08-13 ENCOUNTER — Other Ambulatory Visit (HOSPITAL_COMMUNITY): Payer: Self-pay

## 2022-08-13 DIAGNOSIS — C772 Secondary and unspecified malignant neoplasm of intra-abdominal lymph nodes: Secondary | ICD-10-CM | POA: Diagnosis not present

## 2022-08-13 DIAGNOSIS — Z510 Encounter for antineoplastic radiation therapy: Secondary | ICD-10-CM | POA: Diagnosis not present

## 2022-08-13 DIAGNOSIS — C670 Malignant neoplasm of trigone of bladder: Secondary | ICD-10-CM | POA: Diagnosis present

## 2022-08-13 DIAGNOSIS — Z5112 Encounter for antineoplastic immunotherapy: Secondary | ICD-10-CM | POA: Insufficient documentation

## 2022-08-13 DIAGNOSIS — C775 Secondary and unspecified malignant neoplasm of intrapelvic lymph nodes: Secondary | ICD-10-CM | POA: Diagnosis not present

## 2022-08-13 DIAGNOSIS — C679 Malignant neoplasm of bladder, unspecified: Secondary | ICD-10-CM

## 2022-08-13 MED ORDER — OXYCODONE-ACETAMINOPHEN 10-325 MG PO TABS
1.0000 | ORAL_TABLET | ORAL | 0 refills | Status: DC | PRN
  Filled 2022-08-13: qty 90, 15d supply, fill #0

## 2022-08-13 NOTE — Progress Notes (Signed)
  Radiation Oncology         ([phone]) [phone] ________________________________  Name: Arland Usery Mcnorton MRN: [id number]  Date: 08/13/2022  DOB: 02-09-1951  SIMULATION AND TREATMENT PLANNING NOTE    ICD-10-CM   1. Bladder cancer metastasized to intra-abdominal lymph nodes (HCC)  C67.9    C77.2       DIAGNOSIS:  71 yo man with pain and right leg lymphedema from metastatic lymphadenopathy of right iliac and paraaortic nodes from urothelial cancer of the trigone of the bladder.  NARRATIVE:  The patient was brought to the [facility].  Identity was confirmed.  All relevant records and images related to the planned course of therapy were reviewed.  The patient freely provided informed written consent to proceed with treatment after reviewing the details related to the planned course of therapy. The consent form was witnessed and verified by the simulation staff.  Then, the patient was set-up in a stable reproducible  supine position for radiation therapy.  CT images were obtained.  Surface markings were placed.  The CT images were loaded into the planning software.  Then the target and avoidance structures were contoured.  Treatment planning then occurred.  The radiation prescription was entered and confirmed.  Then, I designed and supervised the construction of a total of multiple medically necessary complex treatment devices consisting of leg positioner and MLC apertures to cover the treated adenopathy.  I have requested : 3D Simulation  I have requested a DVH of the following structures: Rectum, Bladder, femoral heads and target.  PLAN:  The patient will receive 30 Gy in 10 fractions.  ________________________________  Sheral Apley Tammi Klippel, M.D.

## 2022-08-15 ENCOUNTER — Encounter: Payer: Self-pay | Admitting: Licensed Clinical Social Worker

## 2022-08-15 ENCOUNTER — Telehealth: Payer: Self-pay | Admitting: Licensed Clinical Social Worker

## 2022-08-15 DIAGNOSIS — Z510 Encounter for antineoplastic radiation therapy: Secondary | ICD-10-CM | POA: Diagnosis not present

## 2022-08-15 NOTE — Telephone Encounter (Signed)
Followed up with Danny Wood about his genetic testing. He spoke with his kids again and both of them are interested in testing. I let him know to give them my contact information about we'd be happy to help them get tested. They both live in [place], one of his children is uninsured and we discussed that we can still get testing covered but that there would be a discounted charge for the [facility] appointment. Patient will pass along my information to his children.

## 2022-08-20 ENCOUNTER — Ambulatory Visit
Admission: RE | Admit: 2022-08-20 | Discharge: 2022-08-20 | Disposition: A | Discharge status: Home / Self Care | Payer: Medicare Other | Source: Ambulatory Visit | Attending: Radiation Oncology | Admitting: Radiation Oncology

## 2022-08-20 ENCOUNTER — Other Ambulatory Visit: Payer: Self-pay

## 2022-08-20 DIAGNOSIS — Z510 Encounter for antineoplastic radiation therapy: Secondary | ICD-10-CM | POA: Diagnosis not present

## 2022-08-20 LAB — RAD ONC ARIA SESSION SUMMARY
Course Elapsed Days: 0
Plan Fractions Treated to Date: 1
Plan Prescribed Dose Per Fraction: 3 Gy
Plan Total Fractions Prescribed: 10
Plan Total Prescribed Dose: 30 Gy
Reference Point Dosage Given to Date: 3 Gy
Reference Point Session Dosage Given: 3 Gy
Session Number: 1

## 2022-08-21 ENCOUNTER — Ambulatory Visit
Admission: RE | Admit: 2022-08-21 | Discharge: 2022-08-21 | Disposition: A | Discharge status: Home / Self Care | Payer: Medicare Other | Source: Ambulatory Visit | Attending: Radiation Oncology | Admitting: Radiation Oncology

## 2022-08-21 ENCOUNTER — Other Ambulatory Visit: Payer: Self-pay

## 2022-08-21 DIAGNOSIS — Z510 Encounter for antineoplastic radiation therapy: Secondary | ICD-10-CM | POA: Diagnosis not present

## 2022-08-21 LAB — RAD ONC ARIA SESSION SUMMARY
Course Elapsed Days: 1
Plan Fractions Treated to Date: 2
Plan Prescribed Dose Per Fraction: 3 Gy
Plan Total Fractions Prescribed: 10
Plan Total Prescribed Dose: 30 Gy
Reference Point Dosage Given to Date: 6 Gy
Reference Point Session Dosage Given: 3 Gy
Session Number: 2

## 2022-08-22 ENCOUNTER — Other Ambulatory Visit: Payer: Self-pay

## 2022-08-22 ENCOUNTER — Ambulatory Visit
Admission: RE | Admit: 2022-08-22 | Discharge: 2022-08-22 | Disposition: A | Discharge status: Home / Self Care | Payer: Medicare Other | Source: Ambulatory Visit | Attending: Radiation Oncology | Admitting: Radiation Oncology

## 2022-08-22 DIAGNOSIS — Z510 Encounter for antineoplastic radiation therapy: Secondary | ICD-10-CM | POA: Diagnosis not present

## 2022-08-22 LAB — RAD ONC ARIA SESSION SUMMARY
Course Elapsed Days: 2
Plan Fractions Treated to Date: 3
Plan Prescribed Dose Per Fraction: 3 Gy
Plan Total Fractions Prescribed: 10
Plan Total Prescribed Dose: 30 Gy
Reference Point Dosage Given to Date: 9 Gy
Reference Point Session Dosage Given: 3 Gy
Session Number: 3

## 2022-08-22 MED FILL — Dexamethasone Sodium Phosphate Inj 100 MG/10ML: INTRAMUSCULAR | Qty: 1 | Status: AC

## 2022-08-22 MED FILL — Fosaprepitant Dimeglumine For IV Infusion 150 MG (Base Eq): INTRAVENOUS | Qty: 5 | Status: AC

## 2022-08-23 ENCOUNTER — Inpatient Hospital Stay: Payer: Medicare Other

## 2022-08-23 ENCOUNTER — Ambulatory Visit: Admission: RE | Admit: 2022-08-23 | Payer: Medicare Other | Source: Ambulatory Visit

## 2022-08-23 ENCOUNTER — Inpatient Hospital Stay (HOSPITAL_BASED_OUTPATIENT_CLINIC_OR_DEPARTMENT_OTHER): Payer: Medicare Other | Admitting: Oncology

## 2022-08-23 ENCOUNTER — Ambulatory Visit
Admission: RE | Admit: 2022-08-23 | Discharge: 2022-08-23 | Disposition: A | Discharge status: Home / Self Care | Payer: Medicare Other | Source: Ambulatory Visit | Attending: Radiation Oncology | Admitting: Radiation Oncology

## 2022-08-23 ENCOUNTER — Other Ambulatory Visit: Payer: Self-pay

## 2022-08-23 VITALS — BP 95/53 | HR 60 | Resp 16

## 2022-08-23 VITALS — BP 95/60 | HR 99 | Temp 97.3°F | Resp 17 | Ht 74.0 in | Wt 172.7 lb

## 2022-08-23 DIAGNOSIS — Z95828 Presence of other vascular implants and grafts: Secondary | ICD-10-CM

## 2022-08-23 DIAGNOSIS — Z510 Encounter for antineoplastic radiation therapy: Secondary | ICD-10-CM | POA: Diagnosis not present

## 2022-08-23 DIAGNOSIS — C670 Malignant neoplasm of trigone of bladder: Secondary | ICD-10-CM

## 2022-08-23 LAB — CBC WITH DIFFERENTIAL (CANCER CENTER ONLY)
Abs Immature Granulocytes: 0.01 10*3/uL (ref 0.00–0.07)
Basophils Absolute: 0 10*3/uL (ref 0.0–0.1)
Basophils Relative: 0 %
Eosinophils Absolute: 0.1 10*3/uL (ref 0.0–0.5)
Eosinophils Relative: 2 %
HCT: 37 % — ABNORMAL LOW (ref 39.0–52.0)
Hemoglobin: 12.9 g/dL — ABNORMAL LOW (ref 13.0–17.0)
Immature Granulocytes: 0 %
Lymphocytes Relative: 17 %
Lymphs Abs: 0.8 10*3/uL (ref 0.7–4.0)
MCH: 30.1 pg (ref 26.0–34.0)
MCHC: 34.9 g/dL (ref 30.0–36.0)
MCV: 86.4 fL (ref 80.0–100.0)
Monocytes Absolute: 0.4 10*3/uL (ref 0.1–1.0)
Monocytes Relative: 8 %
Neutro Abs: 3.4 10*3/uL (ref 1.7–7.7)
Neutrophils Relative %: 73 %
Platelet Count: 192 10*3/uL (ref 150–400)
RBC: 4.28 MIL/uL (ref 4.22–5.81)
RDW: 12.3 % (ref 11.5–15.5)
WBC Count: 4.6 10*3/uL (ref 4.0–10.5)
nRBC: 0 % (ref 0.0–0.2)

## 2022-08-23 LAB — RAD ONC ARIA SESSION SUMMARY
Course Elapsed Days: 3
Plan Fractions Treated to Date: 4
Plan Prescribed Dose Per Fraction: 3 Gy
Plan Total Fractions Prescribed: 10
Plan Total Prescribed Dose: 30 Gy
Reference Point Dosage Given to Date: 12 Gy
Reference Point Session Dosage Given: 3 Gy
Session Number: 4

## 2022-08-23 LAB — CMP (CANCER CENTER ONLY)
ALT: 21 U/L (ref 0–44)
AST: 23 U/L (ref 15–41)
Albumin: 3.2 g/dL — ABNORMAL LOW (ref 3.5–5.0)
Alkaline Phosphatase: 58 U/L (ref 38–126)
Anion gap: 4 — ABNORMAL LOW (ref 5–15)
BUN: 22 mg/dL (ref 8–23)
CO2: 30 mmol/L (ref 22–32)
Calcium: 8.4 mg/dL — ABNORMAL LOW (ref 8.9–10.3)
Chloride: 104 mmol/L (ref 98–111)
Creatinine: 0.87 mg/dL (ref 0.61–1.24)
GFR, Estimated: >60 mL/min (ref >60)
Glucose, Bld: 177 mg/dL — ABNORMAL HIGH (ref 70–99)
Potassium: 4.1 mmol/L (ref 3.5–5.1)
Sodium: 138 mmol/L (ref 135–145)
Total Bilirubin: 0.4 mg/dL (ref 0.3–1.2)
Total Protein: 5.6 g/dL — ABNORMAL LOW (ref 6.5–8.1)

## 2022-08-23 LAB — MAGNESIUM: Magnesium: 1.2 mg/dL — ABNORMAL LOW (ref 1.7–2.4)

## 2022-08-23 LAB — PHOSPHORUS: Phosphorus: 3.7 mg/dL (ref 2.5–4.6)

## 2022-08-23 MED ORDER — MAGNESIUM SULFATE 2 GM/50ML IV SOLN
2.0000 g | Freq: Once | INTRAVENOUS | Status: AC
  Administered 2022-08-23: 2 g via INTRAVENOUS
  Filled 2022-08-23: qty 50

## 2022-08-23 MED ORDER — SODIUM CHLORIDE 0.9% FLUSH
10.0000 mL | INTRAVENOUS | Status: DC | PRN
  Administered 2022-08-23: 10 mL

## 2022-08-23 MED ORDER — HEPARIN SOD (PORK) LOCK FLUSH 100 UNIT/ML IV SOLN
500.0000 [IU] | Freq: Once | INTRAVENOUS | Status: AC | PRN
  Administered 2022-08-23: 500 [IU]

## 2022-08-23 MED ORDER — SODIUM CHLORIDE 0.9 % IV SOLN
10.0000 mg | Freq: Once | INTRAVENOUS | Status: AC
  Administered 2022-08-23: 10 mg via INTRAVENOUS
  Filled 2022-08-23: qty 10

## 2022-08-23 MED ORDER — SODIUM CHLORIDE 0.9% FLUSH
10.0000 mL | Freq: Once | INTRAVENOUS | Status: AC
  Administered 2022-08-23: 10 mL

## 2022-08-23 MED ORDER — ATROPINE SULFATE 1 MG/ML IV SOLN
0.5000 mg | Freq: Once | INTRAVENOUS | Status: DC | PRN
  Filled 2022-08-23: qty 1

## 2022-08-23 MED ORDER — SODIUM CHLORIDE 0.9 % IV SOLN: Freq: Once | INTRAVENOUS | Status: AC

## 2022-08-23 MED ORDER — FAMOTIDINE IN NACL 20-0.9 MG/50ML-% IV SOLN
20.0000 mg | Freq: Once | INTRAVENOUS | Status: AC
  Administered 2022-08-23: 20 mg via INTRAVENOUS
  Filled 2022-08-23: qty 50

## 2022-08-23 MED ORDER — PALONOSETRON HCL INJECTION 0.25 MG/5ML
0.2500 mg | Freq: Once | INTRAVENOUS | Status: AC
  Administered 2022-08-23: 0.25 mg via INTRAVENOUS
  Filled 2022-08-23: qty 5

## 2022-08-23 MED ORDER — SODIUM CHLORIDE 0.9 % IV SOLN
7.1000 mg/kg | Freq: Once | INTRAVENOUS | Status: AC
  Administered 2022-08-23: 540 mg via INTRAVENOUS
  Filled 2022-08-23: qty 54

## 2022-08-23 MED ORDER — SODIUM CHLORIDE 0.9 % IV SOLN
150.0000 mg | Freq: Once | INTRAVENOUS | Status: AC
  Administered 2022-08-23: 150 mg via INTRAVENOUS
  Filled 2022-08-23: qty 150

## 2022-08-23 MED ORDER — ACETAMINOPHEN 325 MG PO TABS
650.0000 mg | ORAL_TABLET | Freq: Once | ORAL | Status: AC
  Administered 2022-08-23: 650 mg via ORAL
  Filled 2022-08-23: qty 2

## 2022-08-23 NOTE — Progress Notes (Signed)
Hematology and Oncology Follow Up  Danny Wood [id number] [birth year] 71 y.o. 08/23/2022 8:25 AM Danny Wood, MDShadad, Danny Dad, MD      Principle Diagnosis:  31 year old man with stage IV high-grade urothelial carcinoma with pelvic adenopathy diagnosed in 2019.  Initially presented with localized disease in 2015.    Prior Therapy:  He underwent a cystoscopy and a TURBT on 05/17/2014.    Neoadjuvant systemic chemotherapy in the form of gemcitabine and cisplatin cycle 1 day 1 is on 06/17/2014. He is S/P two cycles completed in 07/2014.  Therapy tolerated poorly with symptoms of nausea and vomiting and worsening renal function.   He is status post robotic cystoprostatectomy and bilateral lymphadenectomy completed on September 09, 2014.  The final pathology revealed T2N0 without any lymph node involvement.   He developed pelvic adenopathy that was biopsy-proven on Jan 14, 2018 to be metastatic urothelial carcinoma.   Carboplatin and gemcitabine cycle 1 started on 02/17/2018.  He completed 8 cycles of therapy in December 2019.   Pembrolizumab 200 mg every 3 weeks started on April 01, 2019.  He is status post 7 cycles of therapy in December 2020.  Therapy stopped because of of patient preference, excellent clinical response and treatment holiday.  He developed progression of disease in June 2021.   Padcev started on February 11, 2020.  He has been receiving monthly maintenance therapy up till March 2023.  He developed disease progression at that time.  [id number] analysis showed an ATM mutation as well as ERBB2 mutation.  Lynparza 300 mg twice a day started on December 26, 2021.  Therapy discontinued in July 2023 for progression of disease.       Current therapy:    Sacituzumab govitecan started on 9 March 12, 2022.  He is here for day 1 cycle 8 of therapy.     Palliative radiation therapy to right iliac and periaortic lymph node currently ongoing.  The plan is to complete 30 Gray in  10 fractions.     Interim History: Danny Wood presents today for a follow-up visit.  Since last visit, he was evaluated for radiation therapy and about to start treatment.  He reports no major changes in his health with overall pain is manageable with the current morphine regimen.  He denies any nausea, vomiting or diarrhea.  His appetite is reasonable and his performance status although limited has not declined.  He still able to drive and attends activities of daily living.   Medications: Reviewed without changes. Current Outpatient Medications  Medication Sig Dispense Refill   Clotrimazole 1 % LOTN Apply 1 Application topically 2 (two) times daily as needed. 30 mL 0   diphenoxylate-atropine (LOMOTIL) 2.5-0.025 MG tablet 1 to 2 PO QID prn diarrhea 45 tablet 2   furosemide (LASIX) 20 MG tablet Take 2 tablets (40 mg total) by mouth daily. (Patient not taking: Reported on 04/24/2022) 90 tablet 1   loperamide (IMODIUM) 2 MG capsule Take 1 capsule (2 mg total) by mouth as needed for diarrhea or loose stools. 60 capsule 1   meloxicam (MOBIC) 15 MG tablet      morphine (MS CONTIN) 30 MG 12 hr tablet Take 1 tablet (30 mg total) by mouth every 8 (eight) hours. 90 tablet 0   nystatin (MYCOSTATIN/NYSTOP) powder Apply 1 Application topically 3 (three) times daily. 30 g 0   oxyCODONE-acetaminophen (PERCOCET) 10-325 MG tablet Take 1 tablet by mouth every 4 (four) hours as needed for pain. 90 tablet 0  No current facility-administered medications for this visit.   Physical exam   Blood pressure 95/60, pulse 99, temperature (!) 97.3 F (36.3 C), temperature source Temporal, resp. rate 17, height _0  (1.88 m), weight 172 lb 11.2 oz (78.3 kg), SpO2 100 %.    ECOG 1   General appearance: Comfortable appearing without any discomfort Head: Normocephalic without any trauma Oropharynx: Mucous membranes are moist and pink without any thrush or ulcers. Eyes: Pupils are equal and round reactive to  light. Lymph nodes: No cervical, supraclavicular, inguinal or axillary lymphadenopathy.   Heart:regular rate and rhythm.  S1 and S2 without leg edema. Lung: Clear without any rhonchi or wheezes.  No dullness to percussion. Abdomin: Soft, nontender, nondistended with good bowel sounds.  No hepatosplenomegaly. Musculoskeletal: No joint deformity or effusion.  Full range of motion noted. Neurological: No deficits noted on motor, sensory and deep tendon reflex exam. Skin: No petechial rash or dryness.  Appeared moist.       Lab Results: Lab Results  Component Value Date   WBC 7.1 08/02/2022   HGB 13.1 08/02/2022   HCT 37.7 (L) 08/02/2022   MCV 86.9 08/02/2022   PLT 236 08/02/2022     Chemistry      Component Value Date/Time   NA 137 08/02/2022 0835   NA 139 08/05/2014 1335   K 4.0 08/02/2022 0835   K 4.3 08/05/2014 1335   CL 105 08/02/2022 0835   CO2 28 08/02/2022 0835   CO2 26 08/05/2014 1335   BUN 21 08/02/2022 0835   BUN 18.2 08/05/2014 1335   CREATININE 0.81 08/02/2022 0835   CREATININE 1.1 08/05/2014 1335      Component Value Date/Time   CALCIUM 8.5 (L) 08/02/2022 0835   CALCIUM 9.5 08/05/2014 1335   ALKPHOS 61 08/02/2022 0835   ALKPHOS 98 08/05/2014 1335   AST 26 08/02/2022 0835   AST 45 (H) 08/05/2014 1335   ALT 22 08/02/2022 0835   ALT 73 (H) 08/05/2014 1335   BILITOT 0.3 08/02/2022 0835   BILITOT 0.47 08/05/2014 1335           Impression and Plan:  71 year old man with:       1.  Stage IV high-grade urothelial carcinoma of the bladder with pelvic adenopathy diagnosed in 2019.  He is currently on palliative treatment utilizing Sacituzumab in addition to radiation therapy to his pelvic lymph nodes.  Risks and benefits of continuing therapy were discussed at this time.  Complications that include nausea, vomiting, suppression, neutropenia and possible sepsis were reiterated.  I recommended updating his staging scans and January.  Different salvage  therapy options such as Taxotere chemotherapy could be considered although he has been heavily pretreated and unlikely to benefit.  He is agreeable to proceed with treatment today and we will update his staging scan before the next visit.  2. Pain: Manageable with the current morphine regimen twice a day with very little breakthrough in between.   3.  IV access: Port-A-Cath currently in use without any issues.  4.  Diarrhea: Related to chemotherapy and is improved at this time.  5.  Lymphedema: He is currently receiving radiation therapy to right iliac lymph nodes.  6.  Weight loss: His weight is stable and eating better at this time.  7.  Prognosis and goals of care: Therapy maintenance palliative although his performance status remains adequate and aggressive measures are warranted.  8.  Follow-up: In 3 weeks for the next cycle of therapy.  30  minutes were dedicated to this visit.  The time was spent on updating disease status, treatment choices and outlining future plan of care review.  [name], MD 08/23/2022 8:25 AM

## 2022-08-23 NOTE — Patient Instructions (Signed)
Danny Wood  Discharge Instructions: Thank you for choosing [facility] to provide your Wood and hematology care.   If you have a lab appointment with the [facility], please go directly to the [facility] and check in at the registration area.   Wear comfortable clothing and clothing appropriate for easy access to any Portacath or PICC line.   We strive to give you quality time with your provider. You may need to reschedule your appointment if you arrive late (15 or more minutes).  Arriving late affects you and other patients whose appointments are after yours.  Also, if you miss three or more appointments without notifying the office, you may be dismissed from the clinic at the provider's discretion.      For prescription refill requests, have your pharmacy contact our office and allow 72 hours for refills to be completed.    Today you received the following chemotherapy and/or immunotherapy agents: [name]      To help prevent nausea and vomiting after your treatment, we encourage you to take your nausea medication as directed.  BELOW ARE SYMPTOMS THAT SHOULD BE REPORTED IMMEDIATELY: *FEVER GREATER THAN 100.4 F (38 C) OR HIGHER *CHILLS OR SWEATING *NAUSEA AND VOMITING THAT IS NOT CONTROLLED WITH YOUR NAUSEA MEDICATION *UNUSUAL SHORTNESS OF BREATH *UNUSUAL BRUISING OR BLEEDING *URINARY PROBLEMS (pain or burning when urinating, or frequent urination) *BOWEL PROBLEMS (unusual diarrhea, constipation, pain near the anus) TENDERNESS IN MOUTH AND THROAT WITH OR WITHOUT PRESENCE OF ULCERS (sore throat, sores in mouth, or a toothache) UNUSUAL RASH, SWELLING OR PAIN  UNUSUAL VAGINAL DISCHARGE OR ITCHING   Items with * indicate a potential emergency and should be followed up as soon as possible or go to the Emergency Department if any problems should occur.  Please show the CHEMOTHERAPY ALERT CARD or IMMUNOTHERAPY ALERT CARD at check-in to  the Emergency Department and triage nurse.  Should you have questions after your visit or need to cancel or reschedule your appointment, please contact [facility]  Dept: [phone]  and follow the prompts.  Office hours are 8:00 a.m. to 4:30 p.m. Monday - Friday. Please note that voicemails left after 4:00 p.m. may not be returned until the following business day.  We are closed weekends and major holidays. You have access to a nurse at all times for urgent questions. Please call the main number to the clinic Dept: [phone] and follow the prompts.   For any non-urgent questions, you may also contact your provider using MyChart. We now offer e-Visits for anyone 4 and older to request care online for non-urgent symptoms. For details visit [email or web address].   Also download the MyChart app! Go to the app store, search "MyChart", open the app, select [facility], and log in with your MyChart username and password.  Masks are optional in the cancer centers. If you would like for your care team to wear a mask while they are taking care of you, please let them know. You may have one support person who is at least 71 years old accompany you for your appointments.

## 2022-08-27 ENCOUNTER — Ambulatory Visit
Admission: RE | Admit: 2022-08-27 | Discharge: 2022-08-27 | Disposition: A | Discharge status: Home / Self Care | Payer: Medicare Other | Source: Ambulatory Visit | Attending: Radiation Oncology | Admitting: Radiation Oncology

## 2022-08-27 ENCOUNTER — Other Ambulatory Visit: Payer: Self-pay

## 2022-08-27 DIAGNOSIS — Z510 Encounter for antineoplastic radiation therapy: Secondary | ICD-10-CM | POA: Diagnosis not present

## 2022-08-27 LAB — RAD ONC ARIA SESSION SUMMARY
Course Elapsed Days: 7
Plan Fractions Treated to Date: 5
Plan Prescribed Dose Per Fraction: 3 Gy
Plan Total Fractions Prescribed: 10
Plan Total Prescribed Dose: 30 Gy
Reference Point Dosage Given to Date: 15 Gy
Reference Point Session Dosage Given: 3 Gy
Session Number: 5

## 2022-08-28 ENCOUNTER — Ambulatory Visit
Admission: RE | Admit: 2022-08-28 | Discharge: 2022-08-28 | Disposition: A | Discharge status: Home / Self Care | Payer: Medicare Other | Source: Ambulatory Visit | Attending: Radiation Oncology | Admitting: Radiation Oncology

## 2022-08-28 ENCOUNTER — Other Ambulatory Visit: Payer: Self-pay

## 2022-08-28 DIAGNOSIS — Z510 Encounter for antineoplastic radiation therapy: Secondary | ICD-10-CM | POA: Diagnosis not present

## 2022-08-28 LAB — RAD ONC ARIA SESSION SUMMARY
Course Elapsed Days: 8
Plan Fractions Treated to Date: 6
Plan Prescribed Dose Per Fraction: 3 Gy
Plan Total Fractions Prescribed: 10
Plan Total Prescribed Dose: 30 Gy
Reference Point Dosage Given to Date: 18 Gy
Reference Point Session Dosage Given: 3 Gy
Session Number: 6

## 2022-08-29 ENCOUNTER — Telehealth: Payer: Self-pay | Admitting: *Deleted

## 2022-08-29 ENCOUNTER — Other Ambulatory Visit: Payer: Self-pay

## 2022-08-29 ENCOUNTER — Ambulatory Visit
Admission: RE | Admit: 2022-08-29 | Discharge: 2022-08-29 | Disposition: A | Discharge status: Home / Self Care | Payer: Medicare Other | Source: Ambulatory Visit | Attending: Radiation Oncology | Admitting: Radiation Oncology

## 2022-08-29 ENCOUNTER — Other Ambulatory Visit: Payer: Self-pay | Admitting: Oncology

## 2022-08-29 ENCOUNTER — Telehealth: Payer: Self-pay | Admitting: Oncology

## 2022-08-29 DIAGNOSIS — Z510 Encounter for antineoplastic radiation therapy: Secondary | ICD-10-CM | POA: Diagnosis not present

## 2022-08-29 DIAGNOSIS — C679 Malignant neoplasm of bladder, unspecified: Secondary | ICD-10-CM

## 2022-08-29 LAB — RAD ONC ARIA SESSION SUMMARY
Course Elapsed Days: 9
Plan Fractions Treated to Date: 7
Plan Prescribed Dose Per Fraction: 3 Gy
Plan Total Fractions Prescribed: 10
Plan Total Prescribed Dose: 30 Gy
Reference Point Dosage Given to Date: 21 Gy
Reference Point Session Dosage Given: 3 Gy
Session Number: 7

## 2022-08-29 MED ORDER — OXYCODONE-ACETAMINOPHEN 10-325 MG PO TABS: 1.0000 | ORAL_TABLET | ORAL | 0 refills | Status: DC | PRN

## 2022-08-29 NOTE — Telephone Encounter (Signed)
Mr Klemz is requesting a refill of Percocet to [facility] in [place].

## 2022-08-30 ENCOUNTER — Ambulatory Visit
Admission: RE | Admit: 2022-08-30 | Discharge: 2022-08-30 | Disposition: A | Discharge status: Home / Self Care | Payer: Medicare Other | Source: Ambulatory Visit | Attending: Radiation Oncology | Admitting: Radiation Oncology

## 2022-08-30 ENCOUNTER — Other Ambulatory Visit: Payer: Self-pay

## 2022-08-30 DIAGNOSIS — Z510 Encounter for antineoplastic radiation therapy: Secondary | ICD-10-CM | POA: Diagnosis not present

## 2022-08-30 LAB — RAD ONC ARIA SESSION SUMMARY
Course Elapsed Days: 10
Plan Fractions Treated to Date: 8
Plan Prescribed Dose Per Fraction: 3 Gy
Plan Total Fractions Prescribed: 10
Plan Total Prescribed Dose: 30 Gy
Reference Point Dosage Given to Date: 24 Gy
Reference Point Session Dosage Given: 3 Gy
Session Number: 8

## 2022-09-03 ENCOUNTER — Other Ambulatory Visit: Payer: Self-pay

## 2022-09-03 ENCOUNTER — Ambulatory Visit
Admission: RE | Admit: 2022-09-03 | Discharge: 2022-09-03 | Disposition: A | Discharge status: Home / Self Care | Payer: Medicare Other | Source: Ambulatory Visit | Attending: Radiation Oncology | Admitting: Radiation Oncology

## 2022-09-03 DIAGNOSIS — C772 Secondary and unspecified malignant neoplasm of intra-abdominal lymph nodes: Secondary | ICD-10-CM | POA: Insufficient documentation

## 2022-09-03 DIAGNOSIS — Z510 Encounter for antineoplastic radiation therapy: Secondary | ICD-10-CM | POA: Diagnosis not present

## 2022-09-03 DIAGNOSIS — Z5112 Encounter for antineoplastic immunotherapy: Secondary | ICD-10-CM | POA: Insufficient documentation

## 2022-09-03 DIAGNOSIS — C775 Secondary and unspecified malignant neoplasm of intrapelvic lymph nodes: Secondary | ICD-10-CM | POA: Insufficient documentation

## 2022-09-03 DIAGNOSIS — C670 Malignant neoplasm of trigone of bladder: Secondary | ICD-10-CM | POA: Diagnosis present

## 2022-09-03 LAB — RAD ONC ARIA SESSION SUMMARY
Course Elapsed Days: 14
Plan Fractions Treated to Date: 9
Plan Prescribed Dose Per Fraction: 3 Gy
Plan Total Fractions Prescribed: 10
Plan Total Prescribed Dose: 30 Gy
Reference Point Dosage Given to Date: 27 Gy
Reference Point Session Dosage Given: 3 Gy
Session Number: 9

## 2022-09-04 ENCOUNTER — Other Ambulatory Visit: Payer: Self-pay

## 2022-09-04 ENCOUNTER — Ambulatory Visit
Admission: RE | Admit: 2022-09-04 | Discharge: 2022-09-04 | Disposition: A | Discharge status: Home / Self Care | Payer: Medicare Other | Source: Ambulatory Visit | Attending: Radiation Oncology | Admitting: Radiation Oncology

## 2022-09-04 ENCOUNTER — Encounter: Payer: Self-pay | Admitting: Urology

## 2022-09-04 DIAGNOSIS — Z510 Encounter for antineoplastic radiation therapy: Secondary | ICD-10-CM | POA: Diagnosis not present

## 2022-09-04 LAB — RAD ONC ARIA SESSION SUMMARY
Course Elapsed Days: 15
Plan Fractions Treated to Date: 10
Plan Prescribed Dose Per Fraction: 3 Gy
Plan Total Fractions Prescribed: 10
Plan Total Prescribed Dose: 30 Gy
Reference Point Dosage Given to Date: 30 Gy
Reference Point Session Dosage Given: 3 Gy
Session Number: 10

## 2022-09-05 ENCOUNTER — Encounter (HOSPITAL_COMMUNITY): Payer: Self-pay

## 2022-09-05 ENCOUNTER — Ambulatory Visit (HOSPITAL_COMMUNITY)
Admission: RE | Admit: 2022-09-05 | Discharge: 2022-09-05 | Disposition: A | Discharge status: Home / Self Care | Payer: Medicare Other | Source: Ambulatory Visit | Attending: Oncology | Admitting: Oncology

## 2022-09-05 DIAGNOSIS — C670 Malignant neoplasm of trigone of bladder: Secondary | ICD-10-CM | POA: Insufficient documentation

## 2022-09-05 MED ORDER — HEPARIN SOD (PORK) LOCK FLUSH 100 UNIT/ML IV SOLN
INTRAVENOUS | Status: AC
  Filled 2022-09-05: qty 5

## 2022-09-05 MED ORDER — HEPARIN SOD (PORK) LOCK FLUSH 100 UNIT/ML IV SOLN
500.0000 [IU] | Freq: Once | INTRAVENOUS | Status: AC
  Administered 2022-09-05: 500 [IU] via INTRAVENOUS

## 2022-09-05 MED ORDER — IOHEXOL 300 MG/ML  SOLN
100.0000 mL | Freq: Once | INTRAMUSCULAR | Status: AC | PRN
  Administered 2022-09-05: 100 mL via INTRAVENOUS

## 2022-09-09 ENCOUNTER — Telehealth: Payer: Self-pay | Admitting: Oncology

## 2022-09-09 NOTE — Telephone Encounter (Signed)
Called patient regarding upcoming January appointments, left a voicemail. 

## 2022-09-12 MED FILL — Fosaprepitant Dimeglumine For IV Infusion 150 MG (Base Eq): INTRAVENOUS | Qty: 5 | Status: AC

## 2022-09-12 MED FILL — Dexamethasone Sodium Phosphate Inj 100 MG/10ML: INTRAMUSCULAR | Qty: 1 | Status: AC

## 2022-09-13 ENCOUNTER — Inpatient Hospital Stay: Payer: Medicare Other | Attending: Oncology | Admitting: Oncology

## 2022-09-13 ENCOUNTER — Inpatient Hospital Stay: Payer: Medicare Other

## 2022-09-13 ENCOUNTER — Other Ambulatory Visit: Payer: Self-pay

## 2022-09-13 VITALS — BP 108/65 | HR 85 | Temp 97.8°F | Resp 18 | Ht 74.0 in | Wt 163.0 lb

## 2022-09-13 VITALS — BP 100/53 | HR 61 | Resp 18

## 2022-09-13 DIAGNOSIS — G893 Neoplasm related pain (acute) (chronic): Secondary | ICD-10-CM | POA: Diagnosis not present

## 2022-09-13 DIAGNOSIS — C670 Malignant neoplasm of trigone of bladder: Secondary | ICD-10-CM | POA: Diagnosis not present

## 2022-09-13 DIAGNOSIS — R634 Abnormal weight loss: Secondary | ICD-10-CM | POA: Insufficient documentation

## 2022-09-13 DIAGNOSIS — M79606 Pain in leg, unspecified: Secondary | ICD-10-CM | POA: Diagnosis not present

## 2022-09-13 DIAGNOSIS — M7989 Other specified soft tissue disorders: Secondary | ICD-10-CM | POA: Insufficient documentation

## 2022-09-13 DIAGNOSIS — R197 Diarrhea, unspecified: Secondary | ICD-10-CM | POA: Insufficient documentation

## 2022-09-13 DIAGNOSIS — R109 Unspecified abdominal pain: Secondary | ICD-10-CM | POA: Insufficient documentation

## 2022-09-13 DIAGNOSIS — I890 Lymphedema, not elsewhere classified: Secondary | ICD-10-CM | POA: Diagnosis not present

## 2022-09-13 DIAGNOSIS — Z79899 Other long term (current) drug therapy: Secondary | ICD-10-CM | POA: Insufficient documentation

## 2022-09-13 DIAGNOSIS — C775 Secondary and unspecified malignant neoplasm of intrapelvic lymph nodes: Secondary | ICD-10-CM | POA: Diagnosis not present

## 2022-09-13 DIAGNOSIS — C679 Malignant neoplasm of bladder, unspecified: Secondary | ICD-10-CM

## 2022-09-13 DIAGNOSIS — Z95828 Presence of other vascular implants and grafts: Secondary | ICD-10-CM

## 2022-09-13 DIAGNOSIS — Z923 Personal history of irradiation: Secondary | ICD-10-CM | POA: Diagnosis not present

## 2022-09-13 LAB — CBC WITH DIFFERENTIAL (CANCER CENTER ONLY)
Abs Immature Granulocytes: 0.02 10*3/uL (ref 0.00–0.07)
Basophils Absolute: 0 10*3/uL (ref 0.0–0.1)
Basophils Relative: 1 %
Eosinophils Absolute: 0.3 10*3/uL (ref 0.0–0.5)
Eosinophils Relative: 7 %
HCT: 34.5 % — ABNORMAL LOW (ref 39.0–52.0)
Hemoglobin: 12.2 g/dL — ABNORMAL LOW (ref 13.0–17.0)
Immature Granulocytes: 0 %
Lymphocytes Relative: 10 %
Lymphs Abs: 0.4 10*3/uL — ABNORMAL LOW (ref 0.7–4.0)
MCH: 29.8 pg (ref 26.0–34.0)
MCHC: 35.4 g/dL (ref 30.0–36.0)
MCV: 84.4 fL (ref 80.0–100.0)
Monocytes Absolute: 0.5 10*3/uL (ref 0.1–1.0)
Monocytes Relative: 12 %
Neutro Abs: 3.2 10*3/uL (ref 1.7–7.7)
Neutrophils Relative %: 70 %
Platelet Count: 224 10*3/uL (ref 150–400)
RBC: 4.09 MIL/uL — ABNORMAL LOW (ref 4.22–5.81)
RDW: 12.4 % (ref 11.5–15.5)
WBC Count: 4.5 10*3/uL (ref 4.0–10.5)
nRBC: 0 % (ref 0.0–0.2)

## 2022-09-13 LAB — CMP (CANCER CENTER ONLY)
ALT: 16 U/L (ref 0–44)
AST: 18 U/L (ref 15–41)
Albumin: 2.9 g/dL — ABNORMAL LOW (ref 3.5–5.0)
Alkaline Phosphatase: 52 U/L (ref 38–126)
Anion gap: 4 — ABNORMAL LOW (ref 5–15)
BUN: 18 mg/dL (ref 8–23)
CO2: 29 mmol/L (ref 22–32)
Calcium: 8.1 mg/dL — ABNORMAL LOW (ref 8.9–10.3)
Chloride: 104 mmol/L (ref 98–111)
Creatinine: 0.72 mg/dL (ref 0.61–1.24)
GFR, Estimated: >60 mL/min (ref >60)
Glucose, Bld: 174 mg/dL — ABNORMAL HIGH (ref 70–99)
Potassium: 4 mmol/L (ref 3.5–5.1)
Sodium: 137 mmol/L (ref 135–145)
Total Bilirubin: 0.3 mg/dL (ref 0.3–1.2)
Total Protein: 5.3 g/dL — ABNORMAL LOW (ref 6.5–8.1)

## 2022-09-13 LAB — PHOSPHORUS: Phosphorus: 3.3 mg/dL (ref 2.5–4.6)

## 2022-09-13 LAB — MAGNESIUM: Magnesium: 1 mg/dL — ABNORMAL LOW (ref 1.7–2.4)

## 2022-09-13 MED ORDER — SODIUM CHLORIDE 0.9 % IV SOLN: Freq: Once | INTRAVENOUS | Status: AC

## 2022-09-13 MED ORDER — MAGNESIUM SULFATE 2 GM/50ML IV SOLN
2.0000 g | Freq: Once | INTRAVENOUS | Status: AC
  Administered 2022-09-13: 2 g via INTRAVENOUS
  Filled 2022-09-13: qty 50

## 2022-09-13 MED ORDER — HEPARIN SOD (PORK) LOCK FLUSH 100 UNIT/ML IV SOLN: 250.0000 [IU] | Freq: Once | INTRAVENOUS | Status: DC | PRN

## 2022-09-13 MED ORDER — HEPARIN SOD (PORK) LOCK FLUSH 100 UNIT/ML IV SOLN
500.0000 [IU] | Freq: Once | INTRAVENOUS | Status: AC | PRN
  Administered 2022-09-13: 500 [IU]

## 2022-09-13 MED ORDER — SODIUM CHLORIDE 0.9% FLUSH: 3.0000 mL | Freq: Once | INTRAVENOUS | Status: DC | PRN

## 2022-09-13 MED ORDER — SODIUM CHLORIDE 0.9% FLUSH
10.0000 mL | Freq: Once | INTRAVENOUS | Status: AC | PRN
  Administered 2022-09-13: 10 mL

## 2022-09-13 MED ORDER — SODIUM CHLORIDE 0.9% FLUSH
10.0000 mL | Freq: Once | INTRAVENOUS | Status: AC
  Administered 2022-09-13: 10 mL

## 2022-09-13 MED ORDER — MORPHINE SULFATE ER 30 MG PO TBCR: 30.0000 mg | EXTENDED_RELEASE_TABLET | Freq: Three times a day (TID) | ORAL | 0 refills | Status: DC

## 2022-09-13 MED ORDER — OXYCODONE-ACETAMINOPHEN 10-325 MG PO TABS: 1.0000 | ORAL_TABLET | ORAL | 0 refills | Status: DC | PRN

## 2022-09-13 MED ORDER — MORPHINE SULFATE (PF) 2 MG/ML IV SOLN
2.0000 mg | Freq: Once | INTRAVENOUS | Status: AC
  Administered 2022-09-13: 2 mg via INTRAVENOUS
  Filled 2022-09-13: qty 1

## 2022-09-13 MED ORDER — ALTEPLASE 2 MG IJ SOLR: 2.0000 mg | Freq: Once | INTRAMUSCULAR | Status: DC | PRN

## 2022-09-13 NOTE — Progress Notes (Signed)
Hematology and Oncology Follow Up  Danny Wood [id number] Mar 21, 1951 72 y.o. 09/13/2022 8:05 AM Danny Wood, MDShadad, Mathis Dad, MD      Principle Diagnosis:  13 year old man with bladder cancer diagnosed in 2015.  He developed stage IV high-grade urothelial carcinoma with pelvic adenopathy in 2019.     Prior Therapy:  He underwent a cystoscopy and a TURBT on 05/17/2014.    Neoadjuvant systemic chemotherapy in the form of gemcitabine and cisplatin cycle 1 day 1 is on 06/17/2014. He is S/P two cycles completed in 07/2014.  Therapy tolerated poorly with symptoms of nausea and vomiting and worsening renal function.   He is status post robotic cystoprostatectomy and bilateral lymphadenectomy completed on September 09, 2014.  The final pathology revealed T2N0 without any lymph node involvement.   He developed pelvic adenopathy that was biopsy-proven on Jan 14, 2018 to be metastatic urothelial carcinoma.   Carboplatin and gemcitabine cycle 1 started on 02/17/2018.  He completed 8 cycles of therapy in December 2019.   Pembrolizumab 200 mg every 3 weeks started on April 01, 2019.  He is status post 7 cycles of therapy in December 2020.  Therapy stopped because of of patient preference, excellent clinical response and treatment holiday.  He developed progression of disease in June 2021.   Padcev started on February 11, 2020.  He has been receiving monthly maintenance therapy up till March 2023.  He developed disease progression at that time.  [id number] analysis showed an ATM mutation as well as ERBB2 mutation.  Lynparza 300 mg twice a day started on December 26, 2021.  Therapy discontinued in July 2023 for progression of disease.    Palliative radiation therapy to right iliac and periaortic lymph node.  He completed 10 fractions for 30 Gy in December 2023.   Current therapy:    Sacituzumab govitecan started on 9 March 12, 2022.  He is here for day 1 cycle 9 of therapy.          Interim  History: [name] returns today for repeat evaluation.  Since last visit, he reports feeling poorly overall with the decline in his quality of life.  He reports poor p.o. intake and constant pain although morphine and Percocet has helped his symptoms.  Continues to lose weight and is down close to 10 pounds.  Continues to have swelling in his right lower extremity after the conclusion of radiation.  He denies any nausea, vomiting but does report abdominal, pelvic and leg pain.  He is still able to attend to activities of daily living at this time.   Medications: Updated on review. Current Outpatient Medications  Medication Sig Dispense Refill   Clotrimazole 1 % LOTN Apply 1 Application topically 2 (two) times daily as needed. 30 mL 0   diphenoxylate-atropine (LOMOTIL) 2.5-0.025 MG tablet 1 to 2 PO QID prn diarrhea 45 tablet 2   furosemide (LASIX) 20 MG tablet Take 2 tablets (40 mg total) by mouth daily. (Patient not taking: Reported on 04/24/2022) 90 tablet 1   loperamide (IMODIUM) 2 MG capsule Take 1 capsule (2 mg total) by mouth as needed for diarrhea or loose stools. 60 capsule 1   meloxicam (MOBIC) 15 MG tablet      morphine (MS CONTIN) 30 MG 12 hr tablet Take 1 tablet (30 mg total) by mouth every 8 (eight) hours. 90 tablet 0   nystatin (MYCOSTATIN/NYSTOP) powder Apply 1 Application topically 3 (three) times daily. 30 g 0   oxyCODONE-acetaminophen (PERCOCET) 10-325 MG  tablet Take 1 tablet by mouth every 4 (four) hours as needed for pain. 90 tablet 0   No current facility-administered medications for this visit.   Physical exam    Blood pressure 108/65, pulse 85, temperature 97.8 F (36.6 C), temperature source Temporal, resp. rate 18, height '6\' 2"'$  (1.88 m), weight 163 lb (73.9 kg), SpO2 100 %.   ECOG 1   General appearance: Alert, awake without any distress. Head: Atraumatic without abnormalities Oropharynx: Without any thrush or ulcers. Eyes: No scleral icterus. Lymph  nodes: No lymphadenopathy noted in the cervical, supraclavicular, or axillary nodes Heart:regular rate and rhythm, without any murmurs or gallops.   Lung: Clear to auscultation without any rhonchi, wheezes or dullness to percussion. Abdomin: Soft, nontender without any shifting dullness or ascites. Musculoskeletal: No clubbing or cyanosis. Neurological: No motor or sensory deficits. Skin: No rashes or lesions.       Lab Results: Lab Results  Component Value Date   WBC 4.6 08/23/2022   HGB 12.9 (L) 08/23/2022   HCT 37.0 (L) 08/23/2022   MCV 86.4 08/23/2022   PLT 192 08/23/2022     Chemistry      Component Value Date/Time   NA 138 08/23/2022 0818   NA 139 08/05/2014 1335   K 4.1 08/23/2022 0818   K 4.3 08/05/2014 1335   CL 104 08/23/2022 0818   CO2 30 08/23/2022 0818   CO2 26 08/05/2014 1335   BUN 22 08/23/2022 0818   BUN 18.2 08/05/2014 1335   CREATININE 0.87 08/23/2022 0818   CREATININE 1.1 08/05/2014 1335      Component Value Date/Time   CALCIUM 8.4 (L) 08/23/2022 0818   CALCIUM 9.5 08/05/2014 1335   ALKPHOS 58 08/23/2022 0818   ALKPHOS 98 08/05/2014 1335   AST 23 08/23/2022 0818   AST 45 (H) 08/05/2014 1335   ALT 21 08/23/2022 0818   ALT 73 (H) 08/05/2014 1335   BILITOT 0.4 08/23/2022 0818   BILITOT 0.47 08/05/2014 1335        Further increase in size of the confluent central mesenteric and retroperitoneal nodal disease.   Increasing mesenteric stranding and pelvic free fluid.   Developing areas of significant wall thickening and edema of the rectosigmoid colon as well as some loops of small bowel in the lower abdomen and pelvis. Please correlate for any signs of colitis or enteritis. Please correlate for any history of radiation as well.   Surgical changes of prior cystectomy and prostatectomy with ileal loop conduit along the right hemiabdomen.   Two nodular areas of hyperenhancement in the liver. These are not clearly seen previously however the  timing of the contrast is more late arterial phase than portal venous phase and these could represent areas of shunting or hemangiomas. However as these were not clearly seen previously would recommend either short follow-up or MRI to further delineate when appropriate.   Stable nodular areas at the lung apices, right greater than left.   Critical Value/emergent results were called by telephone at the time of interpretation on 09/06/2022 at 3:20 pm to provider [facility] , who verbally acknowledged these results.   Impression and Plan:  72 year old man with:       1.  Bladder cancer diagnosed in 2015.  He was found to have stage IV high-grade urothelial carcinoma with pelvic adenopathy in 2019.  The natural course of this disease was reviewed today and treatment options were discussed.  CT scan obtained on September 05, 2022 continues to  show progression of disease with progressive lymphadenopathy.  Treatment options at this time were discussed including continuing the same regimen given his minimal progression versus switching to taxane based chemotherapy versus supportive care on transition to hospice.  After discussion today, he is not ready to transition to hospice and would like to try another chemotherapy agent.  Single agent Taxotere at 60 mg/m every 3 weeks would be considered.  Complications that include nausea, vomiting, myelosuppression, neutropenia and possible sepsis were reiterated.  Worsening neuropathy is also could occur.  He understands that the response rate would be marginal at this time and if his performance status declines further transitioning to hospice would be his best option.  Is agreeable to proceed with Taxotere and will tentatively start with his next visit on February 1.  2. Pain: He continues to be on morphine with pain regimen is manageable.  He is will be refilled for him today.  3.  IV access: Port-A-Cath continues to be in use without any issues.  4.   Diarrhea: Related to his current chemotherapy regimen.  Will proceed with IV hydration today   5.  Lymphedema: Related to metastatic disease to the lymph node and completed radiation therapy.  6.  Weight loss: We have discussed strategies to improve his appetite and nutritional status.  7.  Prognosis and goals of care: His disease is palliative although aggressive measures are warranted at this time based on his wishes and reasonable performance status.   8.  Follow-up: In 3 weeks for the next cycle of therapy.   30  minutes were spent on this encounter.  The time was dedicated to updating disease status, treatment choices and outlining future plan of care review. [name], MD 09/13/2022 8:05 AM

## 2022-09-13 NOTE — Patient Instructions (Signed)

## 2022-09-26 NOTE — Progress Notes (Signed)
Pharmacist Chemotherapy Monitoring - Initial Assessment    Anticipated start date: 10/03/22   The following has been reviewed per standard work regarding the patient's treatment regimen: The patient's diagnosis, treatment plan and drug doses, and organ/hematologic function Lab orders and baseline tests specific to treatment regimen  The treatment plan start date, drug sequencing, and pre-medications Prior authorization status  Patient's documented medication list, including drug-drug interaction screen and prescriptions for anti-emetics and supportive care specific to the treatment regimen The drug concentrations, fluid compatibility, administration routes, and timing of the medications to be used The patient's access for treatment and lifetime cumulative dose history, if applicable  The patient's medication allergies and previous infusion related reactions, if applicable   Changes made to treatment plan:  N/A  Follow up needed:  N/A   Larene Beach, RPH, 09/26/2022  3:55 PM

## 2022-09-27 ENCOUNTER — Encounter: Payer: Self-pay | Admitting: Oncology

## 2022-09-27 NOTE — Progress Notes (Signed)
                                                                                                                                                             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   Intent: Palliative  Radiation Treatment Dates: 08/20/2022 through 09/04/2022 Site Technique Total Dose (Gy) Dose per Fx (Gy) Completed Fx Beam Energies  Internal Iliac Nodes: Pelvis 3D 30/30 3 10/10 6X, 10X, 15X   Narrative: The patient tolerated radiation therapy relatively well without any acute ill effects.  Plan: The patient will receive a call in about one month from the radiation oncology department. He will continue follow up with his medical oncologist, Dr. Lorenso Courier as well.  ------------------------------------------------   Tyler Pita, MD [facility]: [phone]  Fax: [phone] [email or web address]  Skype  LinkedIn

## 2022-10-02 MED FILL — Dexamethasone Sodium Phosphate Inj 100 MG/10ML: INTRAMUSCULAR | Qty: 1 | Status: AC

## 2022-10-03 ENCOUNTER — Inpatient Hospital Stay: Payer: Medicare Other

## 2022-10-03 ENCOUNTER — Inpatient Hospital Stay (HOSPITAL_BASED_OUTPATIENT_CLINIC_OR_DEPARTMENT_OTHER): Payer: Medicare Other | Admitting: Hematology and Oncology

## 2022-10-03 ENCOUNTER — Other Ambulatory Visit: Payer: Self-pay

## 2022-10-03 ENCOUNTER — Inpatient Hospital Stay: Payer: Medicare Other | Attending: Oncology

## 2022-10-03 VITALS — BP 111/64 | HR 68 | Resp 17

## 2022-10-03 VITALS — BP 110/62 | HR 65 | Temp 97.6°F | Resp 18 | Wt 164.7 lb

## 2022-10-03 DIAGNOSIS — Z85528 Personal history of other malignant neoplasm of kidney: Secondary | ICD-10-CM | POA: Diagnosis not present

## 2022-10-03 DIAGNOSIS — Z5189 Encounter for other specified aftercare: Secondary | ICD-10-CM | POA: Insufficient documentation

## 2022-10-03 DIAGNOSIS — Z809 Family history of malignant neoplasm, unspecified: Secondary | ICD-10-CM | POA: Diagnosis not present

## 2022-10-03 DIAGNOSIS — C679 Malignant neoplasm of bladder, unspecified: Secondary | ICD-10-CM

## 2022-10-03 DIAGNOSIS — G893 Neoplasm related pain (acute) (chronic): Secondary | ICD-10-CM | POA: Insufficient documentation

## 2022-10-03 DIAGNOSIS — R590 Localized enlarged lymph nodes: Secondary | ICD-10-CM | POA: Diagnosis not present

## 2022-10-03 DIAGNOSIS — C670 Malignant neoplasm of trigone of bladder: Secondary | ICD-10-CM

## 2022-10-03 DIAGNOSIS — Z5111 Encounter for antineoplastic chemotherapy: Secondary | ICD-10-CM | POA: Insufficient documentation

## 2022-10-03 DIAGNOSIS — Z9079 Acquired absence of other genital organ(s): Secondary | ICD-10-CM | POA: Diagnosis not present

## 2022-10-03 DIAGNOSIS — R5383 Other fatigue: Secondary | ICD-10-CM | POA: Diagnosis not present

## 2022-10-03 DIAGNOSIS — Z87891 Personal history of nicotine dependence: Secondary | ICD-10-CM | POA: Diagnosis not present

## 2022-10-03 DIAGNOSIS — M79604 Pain in right leg: Secondary | ICD-10-CM | POA: Diagnosis not present

## 2022-10-03 DIAGNOSIS — R197 Diarrhea, unspecified: Secondary | ICD-10-CM | POA: Diagnosis not present

## 2022-10-03 DIAGNOSIS — R109 Unspecified abdominal pain: Secondary | ICD-10-CM | POA: Insufficient documentation

## 2022-10-03 DIAGNOSIS — Z800 Family history of malignant neoplasm of digestive organs: Secondary | ICD-10-CM | POA: Insufficient documentation

## 2022-10-03 DIAGNOSIS — Z79899 Other long term (current) drug therapy: Secondary | ICD-10-CM | POA: Insufficient documentation

## 2022-10-03 LAB — CMP (CANCER CENTER ONLY)
ALT: 16 U/L (ref 0–44)
AST: 19 U/L (ref 15–41)
Albumin: 3 g/dL — ABNORMAL LOW (ref 3.5–5.0)
Alkaline Phosphatase: 53 U/L (ref 38–126)
Anion gap: 3 — ABNORMAL LOW (ref 5–15)
BUN: 18 mg/dL (ref 8–23)
CO2: 29 mmol/L (ref 22–32)
Calcium: 7.9 mg/dL — ABNORMAL LOW (ref 8.9–10.3)
Chloride: 106 mmol/L (ref 98–111)
Creatinine: 0.72 mg/dL (ref 0.61–1.24)
GFR, Estimated: >60 mL/min (ref >60)
Glucose, Bld: 150 mg/dL — ABNORMAL HIGH (ref 70–99)
Potassium: 4.2 mmol/L (ref 3.5–5.1)
Sodium: 138 mmol/L (ref 135–145)
Total Bilirubin: 0.3 mg/dL (ref 0.3–1.2)
Total Protein: 5.4 g/dL — ABNORMAL LOW (ref 6.5–8.1)

## 2022-10-03 LAB — CBC WITH DIFFERENTIAL (CANCER CENTER ONLY)
Abs Immature Granulocytes: 0.02 10*3/uL (ref 0.00–0.07)
Basophils Absolute: 0 10*3/uL (ref 0.0–0.1)
Basophils Relative: 0 %
Eosinophils Absolute: 0.1 10*3/uL (ref 0.0–0.5)
Eosinophils Relative: 2 %
HCT: 34.6 % — ABNORMAL LOW (ref 39.0–52.0)
Hemoglobin: 12.2 g/dL — ABNORMAL LOW (ref 13.0–17.0)
Immature Granulocytes: 0 %
Lymphocytes Relative: 12 %
Lymphs Abs: 0.6 10*3/uL — ABNORMAL LOW (ref 0.7–4.0)
MCH: 29.8 pg (ref 26.0–34.0)
MCHC: 35.3 g/dL (ref 30.0–36.0)
MCV: 84.4 fL (ref 80.0–100.0)
Monocytes Absolute: 0.5 10*3/uL (ref 0.1–1.0)
Monocytes Relative: 9 %
Neutro Abs: 4.1 10*3/uL (ref 1.7–7.7)
Neutrophils Relative %: 77 %
Platelet Count: 203 10*3/uL (ref 150–400)
RBC: 4.1 MIL/uL — ABNORMAL LOW (ref 4.22–5.81)
RDW: 13.9 % (ref 11.5–15.5)
WBC Count: 5.3 10*3/uL (ref 4.0–10.5)
nRBC: 0 % (ref 0.0–0.2)

## 2022-10-03 LAB — MAGNESIUM: Magnesium: 1 mg/dL — ABNORMAL LOW (ref 1.7–2.4)

## 2022-10-03 LAB — PHOSPHORUS: Phosphorus: 3.3 mg/dL (ref 2.5–4.6)

## 2022-10-03 MED ORDER — SODIUM CHLORIDE 0.9 % IV SOLN
60.0000 mg/m2 | Freq: Once | INTRAVENOUS | Status: AC
  Administered 2022-10-03: 120 mg via INTRAVENOUS
  Filled 2022-10-03: qty 12

## 2022-10-03 MED ORDER — SODIUM CHLORIDE 0.9 % IV SOLN
10.0000 mg | Freq: Once | INTRAVENOUS | Status: AC
  Administered 2022-10-03: 10 mg via INTRAVENOUS
  Filled 2022-10-03: qty 10

## 2022-10-03 MED ORDER — HEPARIN SOD (PORK) LOCK FLUSH 100 UNIT/ML IV SOLN
500.0000 [IU] | Freq: Once | INTRAVENOUS | Status: AC | PRN
  Administered 2022-10-03: 500 [IU]

## 2022-10-03 MED ORDER — SODIUM CHLORIDE 0.9% FLUSH
10.0000 mL | INTRAVENOUS | Status: DC | PRN
  Administered 2022-10-03: 10 mL

## 2022-10-03 MED ORDER — SODIUM CHLORIDE 0.9 % IV SOLN: Freq: Once | INTRAVENOUS | Status: AC

## 2022-10-03 NOTE — Progress Notes (Signed)
OK to treat with Magnesium level 1.0 per Dr. Lorenso Courier   Patient complaining of lower abdominal pain that is ongoing with minimal relief from pain medications. Spoke with Dr. Lorenso Courier. Referral to Palliative care placed. Communicated need to palliative team.

## 2022-10-03 NOTE — Patient Instructions (Signed)
[facility]  Discharge Instructions: Thank you for choosing [facility] to provide your oncology and hematology care.   If you have a lab appointment with the [facility], please go directly to the [facility] and check in at the registration area.   Wear comfortable clothing and clothing appropriate for easy access to any Portacath or PICC line.   We strive to give you quality time with your provider. You may need to reschedule your appointment if you arrive late (15 or more minutes).  Arriving late affects you and other patients whose appointments are after yours.  Also, if you miss three or more appointments without notifying the office, you may be dismissed from the clinic at the provider's discretion.      For prescription refill requests, have your pharmacy contact our office and allow 72 hours for refills to be completed.    Today you received the following chemotherapy and/or immunotherapy agents: Docetaxel      To help prevent nausea and vomiting after your treatment, we encourage you to take your nausea medication as directed.  BELOW ARE SYMPTOMS THAT SHOULD BE REPORTED IMMEDIATELY: *FEVER GREATER THAN 100.4 F (38 C) OR HIGHER *CHILLS OR SWEATING *NAUSEA AND VOMITING THAT IS NOT CONTROLLED WITH YOUR NAUSEA MEDICATION *UNUSUAL SHORTNESS OF BREATH *UNUSUAL BRUISING OR BLEEDING *URINARY PROBLEMS (pain or burning when urinating, or frequent urination) *BOWEL PROBLEMS (unusual diarrhea, constipation, pain near the anus) TENDERNESS IN MOUTH AND THROAT WITH OR WITHOUT PRESENCE OF ULCERS (sore throat, sores in mouth, or a toothache) UNUSUAL RASH, SWELLING OR PAIN  UNUSUAL VAGINAL DISCHARGE OR ITCHING   Items with * indicate a potential emergency and should be followed up as soon as possible or go to the Emergency Department if any problems should occur.  Please show the CHEMOTHERAPY ALERT CARD or IMMUNOTHERAPY ALERT CARD at  check-in to the Emergency Department and triage nurse.  Should you have questions after your visit or need to cancel or reschedule your appointment, please contact [facility]  Dept: [phone]  and follow the prompts.  Office hours are 8:00 a.m. to 4:30 p.m. Monday - Friday. Please note that voicemails left after 4:00 p.m. may not be returned until the following business day.  We are closed weekends and major holidays. You have access to a nurse at all times for urgent questions. Please call the main number to the clinic Dept: [phone] and follow the prompts.   For any non-urgent questions, you may also contact your provider using MyChart. We now offer e-Visits for anyone 22 and older to request care online for non-urgent symptoms. For details visit [email or web address].   Also download the MyChart app! Go to the app store, search "MyChart", open the app, select [facility], and log in with your MyChart username and password. Docetaxel Injection What is this medication? DOCETAXEL (doe se TAX el) treats some types of cancer. It works by slowing down the growth of cancer cells. This medicine may be used for other purposes; ask your health care provider or pharmacist if you have questions. COMMON BRAND NAME(S): Docefrez, Taxotere What should I tell my care team before I take this medication? They need to know if you have any of these conditions: Kidney disease Liver disease Low white blood cell levels Tingling of the fingers or toes or other nerve disorder An unusual or allergic reaction to docetaxel, polysorbate 80, other medications, foods, dyes, or preservatives  Pregnant or trying to get pregnant Breast-feeding How should I use this medication? This medication is injected into a vein. It is given by your care team in a hospital or clinic setting. Talk to your care team about the use of this medication in children. Special care may be  needed. Overdosage: If you think you have taken too much of this medicine contact a poison control center or emergency room at once. NOTE: This medicine is only for you. Do not share this medicine with others. What if I miss a dose? Keep appointments for follow-up doses. It is important not to miss your dose. Call your care team if you are unable to keep an appointment. What may interact with this medication? Do not take this medication with any of the following: Live virus vaccines This medication may also interact with the following: Certain antibiotics, such as clarithromycin, telithromycin Certain antivirals for HIV or hepatitis Certain medications for fungal infections, such as itraconazole, ketoconazole, voriconazole Grapefruit juice Nefazodone Supplements, such as St. John's wort This list may not describe all possible interactions. Give your health care provider a list of all the medicines, herbs, non-prescription drugs, or dietary supplements you use. Also tell them if you smoke, drink alcohol, or use illegal drugs. Some items may interact with your medicine. What should I watch for while using this medication? This medication may make you feel generally unwell. This is not uncommon as chemotherapy can affect healthy cells as well as cancer cells. Report any side effects. Continue your course of treatment even though you feel ill unless your care team tells you to stop. You may need blood work done while you are taking this medication. This medication can cause serious side effects and infusion reactions. To reduce the risk, your care team may give you other medications to take before receiving this one. Be sure to follow the directions from your care team. This medication may increase your risk of getting an infection. Call your care team for advice if you get a fever, chills, sore throat, or other symptoms of a cold or flu. Do not treat yourself. Try to avoid being around people who  are sick. Avoid taking medications that contain aspirin, acetaminophen, ibuprofen, naproxen, or ketoprofen unless instructed by your care team. These medications may hide a fever. Be careful brushing or flossing your teeth or using a toothpick because you may get an infection or bleed more easily. If you have any dental work done, tell your dentist you are receiving this medication. Some products may contain alcohol. Ask your care team if this medication contains alcohol. Be sure to tell all care teams you are taking this medicine. Certain medications, like metronidazole and disulfiram, can cause an unpleasant reaction when taken with alcohol. The reaction includes flushing, headache, nausea, vomiting, sweating, and increased thirst. The reaction can last from 30 minutes to several hours. This medication may affect your coordination, reaction time, or judgement. Do not drive or operate machinery until you know how this medication affects you. Sit up or stand slowly to reduce the risk of dizzy or fainting spells. Drinking alcohol with this medication can increase the risk of these side effects. Talk to your care team about your risk of cancer. You may be more at risk for certain types of cancer if you take this medication. Talk to your care team if you wish to become pregnant or think you might be pregnant. This medication can cause serious birth defects if taken during pregnancy or  if you get pregnant within 2 months after stopping therapy. A negative pregnancy test is required before starting this medication. A reliable form of contraception is recommended while taking this medication and for 2 months after stopping it. Talk to your care team about reliable forms of contraception. Do not breast-feed while taking this medication and for 1 week after stopping therapy. Use a condom during sex and for 4 months after stopping therapy. Tell your care team right away if you think your partner might be pregnant.  This medication can cause serious birth defects. This medication may cause infertility. Talk to your care team if you are concerned about your fertility. What side effects may I notice from receiving this medication? Side effects that you should report to your care team as soon as possible: Allergic reactions--skin rash, itching, hives, swelling of the face, lips, tongue, or throat Change in vision such as blurry vision, seeing halos around lights, vision loss Infection--fever, chills, cough, or sore throat Infusion reactions--chest pain, shortness of breath or trouble breathing, feeling faint or lightheaded Low red blood cell level--unusual weakness or fatigue, dizziness, headache, trouble breathing Pain, tingling, or numbness in the hands or feet Painful swelling, warmth, or redness of the skin, blisters or sores at the infusion site Redness, blistering, peeling, or loosening of the skin, including inside the mouth Sudden or severe stomach pain, bloody diarrhea, fever, nausea, vomiting Swelling of the ankles, hands, or feet Tumor lysis syndrome (TLS)--nausea, vomiting, diarrhea, decrease in the amount of urine, dark urine, unusual weakness or fatigue, confusion, muscle pain or cramps, fast or irregular heartbeat, joint pain Unusual bruising or bleeding Side effects that usually do not require medical attention (report to your care team if they continue or are bothersome): Change in nail shape, thickness, or color Change in taste Hair loss Increased tears This list may not describe all possible side effects. Call your doctor for medical advice about side effects. You may report side effects to FDA at 1-800-FDA-1088. Where should I keep my medication? This medication is given in a hospital or clinic. It will not be stored at home. NOTE: This sheet is a summary. It may not cover all possible information. If you have questions about this medicine, talk to your doctor, pharmacist, or health care  provider.  2023 Elsevier/Gold Standard (2007-10-10 00:00:00)

## 2022-10-03 NOTE — Patient Instructions (Signed)

## 2022-10-03 NOTE — Progress Notes (Signed)
[facility] [phone]   [phone]  PROGRESS NOTE  Patient Care Team: [name], MD as PCP - General (Family Medicine)  Hematological/Oncological History # Stage IV High Grade Urothelial Carcinoma 05/17/2014: patient underwent TURBT  06/17/2014: Neoadjuvant systemic chemotherapy in the form of gemcitabine and cisplatin  09/09/2014: robotic cystoprostatectomy and bilateral lymphadenectomy. The final pathology revealed T2N0 without any lymph node involvement.  02/17/2018: Carboplatin and gemcitabine.  He completed 8 cycles of therapy in December 2019.  04/01/2019:  Pembrolizumab 200 mg every 3 weeks. Therapy stopped because of of patient preference, excellent clinical response and treatment holiday.  He developed progression of disease in June 2021.  02/11/2020: Enfortumab vedotin. He has been receiving monthly maintenance therapy up till March 2023.  He developed disease progression at that time.  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 Therapy discontinued in July 2023 for progression of disease.  08/2022: Palliative radiation therapy to right iliac and periaortic lymph node.  He completed 10 fractions for 30 Gy  03/12/2022: Sacituzumab govitecan continued x 9 cycles. D/c due to progression 09/13/2022: last visit with Dr. Alen Blew.  10/03/2022: Transition care to Dr. Lorenso Courier. Cycle 1 Day of Taxotere at 60 mg/m every 3 weeks   Interval History:  Danny Wood 72 y.o. male with medical history significant for static urothelial cancer who presents for a follow up visit. The patient's last visit was on 09/13/2022 with Dr. Alen Blew. In the interim since the last visit he progressed on therapy and plans were made to transition to Taxotere.  On exam today Danny Wood reports that it does not appear that his fentanyl patches are working.  He reports that he has pain predominantly in 2  areas, behind his right leg and in his stomach/lower bladder area.  He reports that is difficult to be in certain positions because of the "pressure of gravity".  He reports he does have good bowel movements though he is not eating particular well.  He is currently losing weight unintentionally.  He reports that sometimes when he swallows it is uncomfortable.  He notes that he is not currently having any trouble with fevers, chills, sweats, nausea, vomiting or diarrhea.  Overall he is willing and able to proceed with Taxotere treatment today.  A full 10 point ROS was otherwise negative.  MEDICAL HISTORY:  Past Medical History:  Diagnosis Date   At risk for sleep apnea    STOP-BANG= 4       SENT TO PCP 05-16-2014   bladder ca dx'd 05/17/14   met dz 12/2017   Bladder disorder    Hematuria    History of renal cell carcinoma    2003  S/P  PARTIAL RIGHT NEPHRECTOMY   Presence of surgical incision    lumbar diskectomy 05-11-2014-  bandage present   Urgency of urination     SURGICAL HISTORY: Past Surgical History:  Procedure Laterality Date   CYSTOSCOPY N/A 09/09/2014   Procedure: CYSTOSCOPY WITH INDOCYANINE GREEN DYE INJECTION AND URETHRAL DILATION;  Surgeon: Alexis Frock, MD;  Location: [facility];  Service: Urology;  Laterality: N/A;   CYSTOSCOPY WITH BIOPSY N/A 05/17/2014   Procedure: CYSTO WITH BLADDER BIOPSY;  Surgeon: Malka So, MD;  Location: [facility];  Service: Urology;  Laterality: N/A;   EVALUATION UNDER ANESTHESIA WITH HEMORRHOIDECTOMY  07/07/2012   Procedure: EXAM UNDER ANESTHESIA WITH HEMORRHOIDECTOMY;  Surgeon: Joyice Faster. Cornett, MD;  Location:  [facility];  Service: General;  Laterality: N/A;   IR IMAGING GUIDED PORT INSERTION  02/20/2018   LUMBAR MICRODISCECTOMY  05-11-2014   L4 -- L5 (partial)   PARTIAL NEPHRECTOMY Right 2003   ROBOT ASSISTED LAPAROSCOPIC COMPLETE CYSTECT ILEAL CONDUIT N/A 09/09/2014   Procedure: ROBOTIC ASSISTED LAPAROSCOPIC COMPLETE  CYSTOPROSTATECTOMY,  ILEAL CONDUIT;  Surgeon: Alexis Frock, MD;  Location: [facility];  Service: Urology;  Laterality: N/A;    SOCIAL HISTORY: Social History   Socioeconomic History   Marital status: Married    Spouse name: Not on file   Number of children: Not on file   Years of education: Not on file   Highest education level: Not on file  Occupational History   Not on file  Tobacco Use   Smoking status: Former    Packs/day: 0.50    Years: 25.00    Total pack years: 12.50    Types: Cigarettes    Quit date: 06/02/2004    Years since quitting: 18.3   Smokeless tobacco: Never  Substance and Sexual Activity   Alcohol use: Yes    Alcohol/week: 2.0 standard drinks of alcohol    Types: 2 Standard drinks or equivalent per week   Drug use: No   Sexual activity: Not on file  Other Topics Concern   Not on file  Social History Narrative   Not on file   Social Determinants of Health   Financial Resource Strain: Not on file  Food Insecurity: No Food Insecurity (05/08/2022)   Hunger Vital Sign    Worried About Running Out of Food in the Last Year: Never true    Ran Out of Food in the Last Year: Never true  Transportation Needs: Not on file  Physical Activity: Not on file  Stress: Not on file  Social Connections: Not on file  Intimate Partner Violence: Not on file    FAMILY HISTORY: Family History  Problem Relation Age of Onset   Cancer Father        unk type   Colon cancer Maternal Uncle    Diabetes Neg Hx     ALLERGIES:  has No Known Allergies.  MEDICATIONS:  Current Outpatient Medications  Medication Sig Dispense Refill   Clotrimazole 1 % LOTN Apply 1 Application topically 2 (two) times daily as needed. 30 mL 0   loperamide (IMODIUM) 2 MG capsule Take 1 capsule (2 mg total) by mouth as needed for diarrhea or loose stools. 60 capsule 1   diphenoxylate-atropine (LOMOTIL) 2.5-0.025 MG tablet 1 to 2 PO QID prn diarrhea 60 tablet 3   furosemide (LASIX) 20 MG tablet Take 2  tablets (40 mg total) by mouth daily. (Patient not taking: Reported on 04/24/2022) 90 tablet 1   HYDROmorphone (DILAUDID) 2 MG tablet Take 1 tablet (2 mg total) by mouth every 4 (four) hours as needed for severe pain. 30 tablet 0   morphine (MS CONTIN) 30 MG 12 hr tablet Take 1 tablet (30 mg total) by mouth every 8 (eight) hours. 90 tablet 0   nystatin (MYCOSTATIN/NYSTOP) powder Apply 1 Application topically 3 (three) times daily. 30 g 0   ondansetron (ZOFRAN) 8 MG tablet Take 1 tablet (8 mg total) by mouth every 8 (eight) hours as needed for nausea or vomiting. 30 tablet 0   No current facility-administered medications for this visit.    REVIEW OF SYSTEMS:   Constitutional: ( - ) fevers, ( - )  chills , ( - ) night sweats Eyes: ( - )  blurriness of vision, ( - ) double vision, ( - ) watery eyes Ears, nose, mouth, throat, and face: ( - ) mucositis, ( - ) sore throat Respiratory: ( - ) cough, ( - ) dyspnea, ( - ) wheezes Cardiovascular: ( - ) palpitation, ( - ) chest discomfort, ( - ) lower extremity swelling Gastrointestinal:  ( - ) nausea, ( - ) heartburn, ( - ) change in bowel habits Skin: ( - ) abnormal skin rashes Lymphatics: ( - ) new lymphadenopathy, ( - ) easy bruising Neurological: ( - ) numbness, ( - ) tingling, ( - ) new weaknesses Behavioral/Psych: ( - ) mood change, ( - ) new changes  All other systems were reviewed with the patient and are negative.  PHYSICAL EXAMINATION: ECOG PERFORMANCE STATUS: 1 - Symptomatic but completely ambulatory  Vitals:   10/03/22 0850  BP: 110/62  Pulse: 65  Resp: 18  Temp: 97.6 F (36.4 C)  SpO2: 99%   Filed Weights   10/03/22 0850  Weight: 164 lb 11.2 oz (74.7 kg)    GENERAL: Well-appearing elderly Caucasian male, alert, no distress and comfortable SKIN: skin color, texture, turgor are normal, no rashes or significant lesions EYES: conjunctiva are pink and non-injected, sclera clear LUNGS: clear to auscultation and percussion with  normal breathing effort HEART: regular rate & rhythm and no murmurs and no lower extremity edema Musculoskeletal: no cyanosis of digits and no clubbing  PSYCH: alert & oriented x 3, fluent speech NEURO: no focal motor/sensory deficits  LABORATORY DATA:  I have reviewed the data as listed    Latest Ref Rng & Units 10/03/2022    8:50 AM 09/13/2022    8:11 AM 08/23/2022    8:18 AM  CBC  WBC 4.0 - 10.5 K/uL 5.3  4.5  4.6   Hemoglobin 13.0 - 17.0 g/dL 12.2  12.2  12.9   Hematocrit 39.0 - 52.0 % 34.6  34.5  37.0   Platelets 150 - 400 K/uL 203  224  192        Latest Ref Rng & Units 10/03/2022    8:50 AM 09/13/2022    8:11 AM 08/23/2022    8:18 AM  CMP  Glucose 70 - 99 mg/dL 150  174  177   BUN 8 - 23 mg/dL 18  18  22   $ Creatinine 0.61 - 1.24 mg/dL 0.72  0.72  0.87   Sodium 135 - 145 mmol/L 138  137  138   Potassium 3.5 - 5.1 mmol/L 4.2  4.0  4.1   Chloride 98 - 111 mmol/L 106  104  104   CO2 22 - 32 mmol/L 29  29  30   $ Calcium 8.9 - 10.3 mg/dL 7.9  8.1  8.4   Total Protein 6.5 - 8.1 g/dL 5.4  5.3  5.6   Total Bilirubin 0.3 - 1.2 mg/dL 0.3  0.3  0.4   Alkaline Phos 38 - 126 U/L 53  52  58   AST 15 - 41 U/L 19  18  23   $ ALT 0 - 44 U/L 16  16  21     $ RADIOGRAPHIC STUDIES: No results found.  ASSESSMENT & PLAN Danny Wood 72 y.o. male with medical history significant for static urothelial cancer who presents for a follow up visit.   # Stage IV Urothelial Cancer -- Patient has extensively pretreated disease.   --Today he presents for cycle 1 day 1 of Taxotere. -- Labs today show white  blood cell count 5.3, hemoglobin 12.2, MCV 84.4, and platelets of 203.  AST and ALT are within normal limits with a creatinine of 0.72  # Pain Control -- Patient is currently following with palliative care. -- Continue Dilaudid 2 mg every 4 hours as needed -- Long-acting pain medication with MS Contin 30 mg every 12 hours  #Supportive Care -- chemotherapy education complete -- port  placed -- zofran 55m q8H PRN and compazine 174mPO q6H for nausea -- EMLA cream for port   Orders Placed This Encounter  Procedures   CBC with Differential ([facility])    Standing Status:   Future    Standing Expiration Date:   10/25/2023   CMP ([facility])    Standing Status:   Future    Standing Expiration Date:   10/25/2023   CBC with Differential ([facility])    Standing Status:   Future    Standing Expiration Date:   11/15/2023   CMP ([facility])    Standing Status:   Future    Standing Expiration Date:   11/15/2023   Amb Referral to Palliative Care    Referral Priority:   Routine    Referral Type:   Consultation    Number of Visits Requested:   1    All questions were answered. The patient knows to call the clinic with any problems, questions or concerns.  A total of more than 40 minutes were spent on this encounter with face-to-face time and non-face-to-face time, including preparing to see the patient, ordering tests and/or medications, counseling the patient and coordination of care as outlined above.   JoLedell PeoplesMD Department of Hematology/Oncology [facility]: [phone]: [phone]: [email or web address]  10/20/2022 12:03 PM

## 2022-10-04 ENCOUNTER — Ambulatory Visit: Payer: Medicare Other | Admitting: Hematology and Oncology

## 2022-10-04 ENCOUNTER — Telehealth: Payer: Self-pay

## 2022-10-04 ENCOUNTER — Ambulatory Visit: Payer: Medicare Other

## 2022-10-04 ENCOUNTER — Other Ambulatory Visit: Payer: Medicare Other

## 2022-10-04 NOTE — Telephone Encounter (Signed)
LM for patient that this nurse was calling to see how they were doing after their treatment. Please call back to Dr.  Dorsey's nurse at [phone] if they have any questions or concerns regarding the treatment.  ?

## 2022-10-04 NOTE — Telephone Encounter (Signed)
-----   Message from Manning Charity, RN sent at 10/03/2022 10:44 AM EST ----- Regarding: Follow up First Time Docetaxel Dr. Lorenso Courier 10/04/2022

## 2022-10-05 ENCOUNTER — Inpatient Hospital Stay: Payer: Medicare Other

## 2022-10-05 VITALS — BP 125/68 | HR 82 | Temp 97.9°F | Resp 17

## 2022-10-05 DIAGNOSIS — C670 Malignant neoplasm of trigone of bladder: Secondary | ICD-10-CM

## 2022-10-05 DIAGNOSIS — Z5111 Encounter for antineoplastic chemotherapy: Secondary | ICD-10-CM | POA: Diagnosis not present

## 2022-10-05 MED ORDER — PEGFILGRASTIM-PBBK 6 MG/0.6ML ~~LOC~~ SOSY
6.0000 mg | PREFILLED_SYRINGE | Freq: Once | SUBCUTANEOUS | Status: AC
  Administered 2022-10-05: 6 mg via SUBCUTANEOUS
  Filled 2022-10-05: qty 0.6

## 2022-10-05 NOTE — Patient Instructions (Signed)

## 2022-10-07 ENCOUNTER — Other Ambulatory Visit: Payer: Self-pay | Admitting: Hematology and Oncology

## 2022-10-07 DIAGNOSIS — C679 Malignant neoplasm of bladder, unspecified: Secondary | ICD-10-CM

## 2022-10-07 MED ORDER — MORPHINE SULFATE ER 30 MG PO TBCR: 30.0000 mg | EXTENDED_RELEASE_TABLET | Freq: Three times a day (TID) | ORAL | 0 refills | Status: DC

## 2022-10-07 MED ORDER — OXYCODONE-ACETAMINOPHEN 10-325 MG PO TABS: 1.0000 | ORAL_TABLET | ORAL | 0 refills | Status: DC | PRN

## 2022-10-08 ENCOUNTER — Other Ambulatory Visit: Payer: Self-pay

## 2022-10-15 ENCOUNTER — Ambulatory Visit
Admission: RE | Admit: 2022-10-15 | Discharge: 2022-10-15 | Disposition: A | Discharge status: Home / Self Care | Payer: Medicare Other | Source: Ambulatory Visit | Attending: Radiation Oncology | Admitting: Radiation Oncology

## 2022-10-15 NOTE — Progress Notes (Signed)
  Radiation Oncology         [phone]) [phone] ________________________________  Name: Danny Wood MRN: [id number]  Date of Service: 10/15/2022  DOB: 28-Jan-1951  Post Treatment Telephone Note  Diagnosis:  72 yo man with pain and right leg lymphedema from metastatic lymphadenopathy of right iliac and paraaortic nodes from urothelial cancer of the trigone of the bladder.   Intent: Palliative  Radiation Treatment Dates: 08/20/2022 through 09/04/2022 Site Technique Total Dose (Gy) Dose per Fx (Gy) Completed Fx Beam Energies  Internal Iliac Nodes: Pelvis 3D 30/30 3 10/10 6X, 10X, 15X    (as documented in provider EOT note)   The patient was not available for call today. Detailed voicemail left.   He will continue follow up with his medical oncologist, Dr. Lorenso Courier. He was encouraged to call back with concerns or questions regarding radiation.   Leandra Kern, LPN

## 2022-10-17 ENCOUNTER — Other Ambulatory Visit: Payer: Self-pay

## 2022-10-17 ENCOUNTER — Inpatient Hospital Stay (HOSPITAL_BASED_OUTPATIENT_CLINIC_OR_DEPARTMENT_OTHER): Payer: Medicare Other | Admitting: Nurse Practitioner

## 2022-10-17 ENCOUNTER — Encounter: Payer: Self-pay | Admitting: Nurse Practitioner

## 2022-10-17 VITALS — BP 125/45 | HR 59 | Temp 97.2°F | Resp 18 | Wt 157.8 lb

## 2022-10-17 DIAGNOSIS — C679 Malignant neoplasm of bladder, unspecified: Secondary | ICD-10-CM

## 2022-10-17 DIAGNOSIS — R110 Nausea: Secondary | ICD-10-CM

## 2022-10-17 DIAGNOSIS — R197 Diarrhea, unspecified: Secondary | ICD-10-CM

## 2022-10-17 DIAGNOSIS — C772 Secondary and unspecified malignant neoplasm of intra-abdominal lymph nodes: Secondary | ICD-10-CM

## 2022-10-17 DIAGNOSIS — R634 Abnormal weight loss: Secondary | ICD-10-CM

## 2022-10-17 DIAGNOSIS — R630 Anorexia: Secondary | ICD-10-CM

## 2022-10-17 DIAGNOSIS — G893 Neoplasm related pain (acute) (chronic): Secondary | ICD-10-CM | POA: Diagnosis not present

## 2022-10-17 DIAGNOSIS — Z7189 Other specified counseling: Secondary | ICD-10-CM

## 2022-10-17 DIAGNOSIS — Z515 Encounter for palliative care: Secondary | ICD-10-CM

## 2022-10-17 DIAGNOSIS — R530 Neoplastic (malignant) related fatigue: Secondary | ICD-10-CM

## 2022-10-17 MED ORDER — ONDANSETRON HCL 8 MG PO TABS: 8.0000 mg | ORAL_TABLET | Freq: Three times a day (TID) | ORAL | 0 refills | Status: DC | PRN

## 2022-10-17 MED ORDER — HYDROMORPHONE HCL 2 MG PO TABS: 2.0000 mg | ORAL_TABLET | ORAL | 0 refills | Status: DC | PRN

## 2022-10-17 MED ORDER — DIPHENOXYLATE-ATROPINE 2.5-0.025 MG PO TABS: ORAL_TABLET | ORAL | 3 refills | Status: DC

## 2022-10-17 NOTE — Patient Instructions (Signed)
-   pick up and take your dilaudid every 4 hours for breakthrough pain - pick up and take Zofran as needed for your nausea - increase your MS Contin to take every 8 hours regardless of level of pain - take lomotil 4 tablets 2 times a day for your diarrhea  - let us know 2-3 days before you are out of a medication for a refill - call the office [phone] or MyChart message [place] with any questions or concerns - call 911 in case of emergency
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
## 2022-10-18 ENCOUNTER — Other Ambulatory Visit: Payer: Self-pay

## 2022-10-20 ENCOUNTER — Encounter: Payer: Self-pay | Admitting: Hematology and Oncology

## 2022-10-20 ENCOUNTER — Encounter: Payer: Self-pay | Admitting: Oncology

## 2022-10-23 ENCOUNTER — Inpatient Hospital Stay (HOSPITAL_BASED_OUTPATIENT_CLINIC_OR_DEPARTMENT_OTHER): Payer: Medicare Other | Admitting: Nurse Practitioner

## 2022-10-23 ENCOUNTER — Encounter: Payer: Self-pay | Admitting: Nurse Practitioner

## 2022-10-23 VITALS — BP 107/53 | HR 68 | Temp 97.8°F | Resp 16 | Wt 162.2 lb

## 2022-10-23 DIAGNOSIS — R530 Neoplastic (malignant) related fatigue: Secondary | ICD-10-CM

## 2022-10-23 DIAGNOSIS — R630 Anorexia: Secondary | ICD-10-CM

## 2022-10-23 DIAGNOSIS — G893 Neoplasm related pain (acute) (chronic): Secondary | ICD-10-CM

## 2022-10-23 DIAGNOSIS — Z515 Encounter for palliative care: Secondary | ICD-10-CM

## 2022-10-23 MED FILL — Dexamethasone Sodium Phosphate Inj 100 MG/10ML: INTRAMUSCULAR | Qty: 1 | Status: AC

## 2022-10-23 NOTE — Progress Notes (Signed)
[place]  Telephone:(336) [phone] [phone]) [phone]   Name: Danny Wood Date: 10/23/2022 MRN: [id number]  DOB: 10-20-1950  Patient Care Team: [name], MD as PCP - General (Family Medicine)    INTERVAL HISTORY: Debora Shirah Pieratt is a 72 y.o. male with oncologic medical history including bladder cancer diagnosed in 2015.  He developed stage IV high-grade urothelial carcinoma with pelvic adenopathy in 2019 despite chemotherapy treatments. Recent treatment change in July 2023 due to further progression with chemoradiation. Actively being treated with Sacituzumab.  Palliative ask to see for symptom management and goals of care.   SOCIAL HISTORY:     reports that he quit smoking about 18 years ago. His smoking use included cigarettes. He has a 12.50 pack-year smoking history. He has never used smokeless tobacco. He reports current alcohol use of about 2.0 standard drinks of alcohol per week. He reports that he does not use drugs.  ADVANCE DIRECTIVES:    CODE STATUS:   PAST MEDICAL HISTORY: Past Medical History:  Diagnosis Date   At risk for sleep apnea    STOP-BANG= 4       SENT TO PCP 05-16-2014   bladder ca dx'd 05/17/14   met dz 12/2017   Bladder disorder    Hematuria    History of renal cell carcinoma    2003  S/P  PARTIAL RIGHT NEPHRECTOMY   Presence of surgical incision    lumbar diskectomy 05-11-2014-  bandage present   Urgency of urination     ALLERGIES:  has No Known Allergies.  MEDICATIONS:  Current Outpatient Medications  Medication Sig Dispense Refill   Clotrimazole 1 % LOTN Apply 1 Application topically 2 (two) times daily as needed. 30 mL 0   diphenoxylate-atropine (LOMOTIL) 2.5-0.025 MG tablet 1 to 2 PO QID prn diarrhea 60 tablet 3   furosemide (LASIX) 20 MG tablet Take 2 tablets (40 mg total) by mouth daily. (Patient not taking: Reported on 04/24/2022) 90 tablet 1   HYDROmorphone (DILAUDID) 2 MG  tablet Take 1 tablet (2 mg total) by mouth every 4 (four) hours as needed for severe pain. 30 tablet 0   loperamide (IMODIUM) 2 MG capsule Take 1 capsule (2 mg total) by mouth as needed for diarrhea or loose stools. 60 capsule 1   morphine (MS CONTIN) 30 MG 12 hr tablet Take 1 tablet (30 mg total) by mouth every 8 (eight) hours. 90 tablet 0   nystatin (MYCOSTATIN/NYSTOP) powder Apply 1 Application topically 3 (three) times daily. 30 g 0   ondansetron (ZOFRAN) 8 MG tablet Take 1 tablet (8 mg total) by mouth every 8 (eight) hours as needed for nausea or vomiting. 30 tablet 0   No current facility-administered medications for this visit.    VITAL SIGNS: There were no vitals taken for this visit. There were no vitals filed for this visit.  Estimated body mass index is 20.26 kg/m as calculated from the following:   Height as of 09/13/22: 6' 2"$  (1.88 m).   Weight as of 10/17/22: 157 lb 12.8 oz (71.6 kg).   PERFORMANCE STATUS (ECOG) : 1 - Symptomatic but completely ambulatory   Physical Exam General: NAD Cardiovascular: regular rate and rhythm Pulmonary: clear ant fields Abdomen: soft, nontender, + bowel sounds Extremities: no edema, no joint deformities Skin: no rashes Neurological: AAO x4  IMPRESSION: Mr. Messler presents to clinic today for symptom management follow-up. No acute distress. He is by himself. States overall he is  doing better. Trying to remain as active as possible. Denies nausea, vomiting, or constipation. Still having some diarrhea.   Neoplasm related pain (abdomen) Jachob reports his pain is well controlled on current regimen. Denies any unwanted side-effects. Taking as directed. Is able to get some rest at night and be as active as possible. Pain level is a 3-4 out of 10. This is manageable for him. Expresses would like to continue with current regimen given improvement and comfort level.    We discussed at length his current pain regimen which consist of MS  Contin 30 mg every 8 hours and hydromorphone 39m every 4 hours as needed for breakthrough pain. He is not requiring hydromorphone around the clock. Requires 1-2 times per day.    No changes to regimen. Will continue to closely monitor and support. Patient knows to contact office with any needs.    Diarrhea Mr. ZPechecontinues to struggle with diarrhea. Unfortunately he did not receive his lomotil. He plans to check with his pharmacy for medication. Discussed importance of pushing fluids to prevent dehydration.   Decreased appetite/weight loss  Appetite is slowly improving. He loves eating PopTarts. Confirms daily protein drinks that he is making at home. Current weight 162lbs up from 157lbs on 2/15. He is appreciative of his weight gain.   Goals of care 2/15: We discussed Her current illness and what it means in the larger context of Her on-going co-morbidities. Natural disease trajectory and expectations were discussed. Mr. ZShindledeckeris realistic in his understanding of his cancer progression and trajectory. Most important to him at this time is to continue to treat the treatable allowing him every opportunity to continue to thrive as well as aggressively managing his symptoms to improve his quality of life.   I discussed the importance of continued conversation with family and their medical providers regarding overall plan of care and treatment options, ensuring decisions are within the context of the patients values and GOCs.  PLAN:  Lomotil 2 tablets 4 times daily Imodium as needed MS Contin 30 mg every 8 hours.  Tolerating well and pain improved.  Hydromorphone 2 mg every 4 hours as needed for breakthrough pain. Not requiring daily.  Started on 2/15. Based on MME and 25% cross tolerance hydromorphone 1.9 mg is expected dose.  Will continue to closely monitor and adjust as needed.  Education provided on potential side effects. Education provided on nutrition and importance of  hydration. Pain contract completed Ondansetron 8 mg every 6 hours as needed for nausea Ongoing goals of care and symptom management as needed I will plan to see patient back in 3-4 weeks in collaboration to other oncology appointments.  Patient knows to contact our office if needed sooner.   Patient expressed understanding and was in agreement with this plan. He also understands that He can call the clinic at any time with any questions, concerns, or complaints.   Any controlled substances utilized were prescribed in the context of palliative care. PDMP has been reviewed.   Time Total: 45 min   Visit consisted of counseling and education dealing with the complex and emotionally intense issues of symptom management and palliative care in the setting of serious and potentially life-threatening illness.Greater than 50%  of this time was spent counseling and coordinating care related to the above assessment and plan.  NAlda Lea AGPCNP-BC  Palliative Medicine Team/[facility]

## 2022-10-24 ENCOUNTER — Inpatient Hospital Stay: Payer: Medicare Other

## 2022-10-24 ENCOUNTER — Other Ambulatory Visit: Payer: Self-pay

## 2022-10-24 ENCOUNTER — Inpatient Hospital Stay (HOSPITAL_BASED_OUTPATIENT_CLINIC_OR_DEPARTMENT_OTHER): Payer: Medicare Other | Admitting: Hematology and Oncology

## 2022-10-24 VITALS — BP 108/55 | HR 68 | Temp 98.2°F | Resp 18

## 2022-10-24 DIAGNOSIS — C679 Malignant neoplasm of bladder, unspecified: Secondary | ICD-10-CM | POA: Diagnosis not present

## 2022-10-24 DIAGNOSIS — R197 Diarrhea, unspecified: Secondary | ICD-10-CM | POA: Diagnosis not present

## 2022-10-24 DIAGNOSIS — Z515 Encounter for palliative care: Secondary | ICD-10-CM | POA: Diagnosis not present

## 2022-10-24 DIAGNOSIS — C670 Malignant neoplasm of trigone of bladder: Secondary | ICD-10-CM

## 2022-10-24 DIAGNOSIS — E860 Dehydration: Secondary | ICD-10-CM

## 2022-10-24 DIAGNOSIS — Z5111 Encounter for antineoplastic chemotherapy: Secondary | ICD-10-CM | POA: Diagnosis not present

## 2022-10-24 DIAGNOSIS — C772 Secondary and unspecified malignant neoplasm of intra-abdominal lymph nodes: Secondary | ICD-10-CM

## 2022-10-24 LAB — CMP (CANCER CENTER ONLY)
ALT: 21 U/L (ref 0–44)
AST: 22 U/L (ref 15–41)
Albumin: 3 g/dL — ABNORMAL LOW (ref 3.5–5.0)
Alkaline Phosphatase: 66 U/L (ref 38–126)
Anion gap: 4 — ABNORMAL LOW (ref 5–15)
BUN: 20 mg/dL (ref 8–23)
CO2: 27 mmol/L (ref 22–32)
Calcium: 7.3 mg/dL — ABNORMAL LOW (ref 8.9–10.3)
Chloride: 106 mmol/L (ref 98–111)
Creatinine: 0.79 mg/dL (ref 0.61–1.24)
GFR, Estimated: >60 mL/min (ref >60)
Glucose, Bld: 177 mg/dL — ABNORMAL HIGH (ref 70–99)
Potassium: 4.3 mmol/L (ref 3.5–5.1)
Sodium: 137 mmol/L (ref 135–145)
Total Bilirubin: 0.3 mg/dL (ref 0.3–1.2)
Total Protein: 5.2 g/dL — ABNORMAL LOW (ref 6.5–8.1)

## 2022-10-24 LAB — PHOSPHORUS: Phosphorus: 3.6 mg/dL (ref 2.5–4.6)

## 2022-10-24 LAB — CBC WITH DIFFERENTIAL (CANCER CENTER ONLY)
Abs Immature Granulocytes: 0.05 10*3/uL (ref 0.00–0.07)
Basophils Absolute: 0 10*3/uL (ref 0.0–0.1)
Basophils Relative: 1 %
Eosinophils Absolute: 0 10*3/uL (ref 0.0–0.5)
Eosinophils Relative: 0 %
HCT: 31.1 % — ABNORMAL LOW (ref 39.0–52.0)
Hemoglobin: 10.9 g/dL — ABNORMAL LOW (ref 13.0–17.0)
Immature Granulocytes: 1 %
Lymphocytes Relative: 8 %
Lymphs Abs: 0.6 10*3/uL — ABNORMAL LOW (ref 0.7–4.0)
MCH: 30.3 pg (ref 26.0–34.0)
MCHC: 35 g/dL (ref 30.0–36.0)
MCV: 86.4 fL (ref 80.0–100.0)
Monocytes Absolute: 0.6 10*3/uL (ref 0.1–1.0)
Monocytes Relative: 9 %
Neutro Abs: 5.8 10*3/uL (ref 1.7–7.7)
Neutrophils Relative %: 81 %
Platelet Count: 255 10*3/uL (ref 150–400)
RBC: 3.6 MIL/uL — ABNORMAL LOW (ref 4.22–5.81)
RDW: 15.2 % (ref 11.5–15.5)
WBC Count: 7.1 10*3/uL (ref 4.0–10.5)
nRBC: 0 % (ref 0.0–0.2)

## 2022-10-24 LAB — MAGNESIUM: Magnesium: 1.1 mg/dL — ABNORMAL LOW (ref 1.7–2.4)

## 2022-10-24 MED ORDER — DIPHENOXYLATE-ATROPINE 2.5-0.025 MG PO TABS: ORAL_TABLET | ORAL | 3 refills | Status: DC

## 2022-10-24 MED ORDER — SODIUM CHLORIDE 0.9 % IV SOLN
60.0000 mg/m2 | Freq: Once | INTRAVENOUS | Status: AC
  Administered 2022-10-24: 118 mg via INTRAVENOUS
  Filled 2022-10-24: qty 11.8

## 2022-10-24 MED ORDER — SODIUM CHLORIDE 0.9 % IV SOLN: Freq: Once | INTRAVENOUS | Status: DC

## 2022-10-24 MED ORDER — SODIUM CHLORIDE 0.9 % IV SOLN
10.0000 mg | Freq: Once | INTRAVENOUS | Status: AC
  Administered 2022-10-24: 10 mg via INTRAVENOUS
  Filled 2022-10-24: qty 10

## 2022-10-24 MED ORDER — SODIUM CHLORIDE 0.9% FLUSH
10.0000 mL | INTRAVENOUS | Status: DC | PRN
  Administered 2022-10-24: 10 mL via INTRAVENOUS

## 2022-10-24 MED ORDER — SODIUM CHLORIDE 0.9 % IV SOLN: Freq: Once | INTRAVENOUS | Status: AC

## 2022-10-24 NOTE — Progress Notes (Signed)
[employer] [phone]   [phone]  PROGRESS NOTE  Patient Care Team: [name], MD as PCP - General (Family Medicine)  Hematological/Oncological History # Stage IV High Grade Urothelial Carcinoma 05/17/2014: patient underwent TURBT  06/17/2014: Neoadjuvant systemic chemotherapy in the form of gemcitabine and cisplatin  09/09/2014: robotic cystoprostatectomy and bilateral lymphadenectomy. The final pathology revealed T2N0 without any lymph node involvement.  02/17/2018: Carboplatin and gemcitabine.  He completed 8 cycles of therapy in December 2019.  04/01/2019:  Pembrolizumab 200 mg every 3 weeks. Therapy stopped because of of patient preference, excellent clinical response and treatment holiday.  He developed progression of disease in June 2021.  02/11/2020: Enfortumab vedotin. He has been receiving monthly maintenance therapy up till March 2023.  He developed disease progression at that time.  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 Therapy discontinued in July 2023 for progression of disease.  08/2022: Palliative radiation therapy to right iliac and periaortic lymph node.  He completed 10 fractions for 30 Gy  03/12/2022: Sacituzumab govitecan continued x 9 cycles. D/c due to progression 09/13/2022: last visit with Dr. Alen Blew.  10/03/2022: Transition care to Dr. Lorenso Courier. Cycle 1 Day of Taxotere at 60 mg/m every 3 weeks  10/24/2022: Cycle 2 Day of Taxotere at 60 mg/m   Interval History:  Danny Wood 72 y.o. male with medical history significant for static urothelial cancer who presents for a follow up visit. The patient's last visit was on 10/03/2022. In the interim since the last visit he progressed on therapy and plans were made to transition to Taxotere.  On exam today Danny Wood reports he tolerated his first cycle of Taxotere quite well.  He did not have any major side  effects.  He reports he does have chronic diarrhea but it has not worsened with his Taxotere.  He is not having any nausea or vomiting.  He reports that he did run out of Lomotil therapy for his diarrhea.  He reports that the medication is "pretty effective".  He notes that he is not having any lightheadedness, dizziness or shortness of breath.  He is having some numbness on his fingertips but no pain or no dysfunction of his fingers.  He reports that he is willing to to proceed without dose reduction as it is "not as noticeable".  He notes his appetite has been so-so and is trying to do his best to eat but most food taste "acidic".  He notes that he is not currently having any trouble with fevers, chills, sweats.  Overall he is willing and able to proceed with Taxotere treatment today.  A full 10 point ROS was otherwise negative.  MEDICAL HISTORY:  Past Medical History:  Diagnosis Date   At risk for sleep apnea    STOP-BANG= 4       SENT TO PCP 05-16-2014   bladder ca dx'd 05/17/14   met dz 12/2017   Bladder disorder    Hematuria    History of renal cell carcinoma    2003  S/P  PARTIAL RIGHT NEPHRECTOMY   Presence of surgical incision    lumbar diskectomy 05-11-2014-  bandage present   Urgency of urination     SURGICAL HISTORY: Past Surgical History:  Procedure Laterality Date   CYSTOSCOPY N/A 09/09/2014   Procedure: CYSTOSCOPY WITH INDOCYANINE GREEN DYE INJECTION AND URETHRAL DILATION;  Surgeon: Alexis Frock, MD;  Location: [facility];  Service: Urology;  Laterality: N/A;   CYSTOSCOPY WITH BIOPSY N/A 05/17/2014   Procedure: CYSTO WITH BLADDER BIOPSY;  Surgeon: Malka So, MD;  Location: [facility];  Service: Urology;  Laterality: N/A;   EVALUATION UNDER ANESTHESIA WITH HEMORRHOIDECTOMY  07/07/2012   Procedure: EXAM UNDER ANESTHESIA WITH HEMORRHOIDECTOMY;  Surgeon: Joyice Faster. Cornett, MD;  Location: [place];  Service: General;  Laterality: N/A;   IR IMAGING GUIDED PORT INSERTION   02/20/2018   LUMBAR MICRODISCECTOMY  05-11-2014   L4 -- L5 (partial)   PARTIAL NEPHRECTOMY Right 2003   ROBOT ASSISTED LAPAROSCOPIC COMPLETE CYSTECT ILEAL CONDUIT N/A 09/09/2014   Procedure: ROBOTIC ASSISTED LAPAROSCOPIC COMPLETE CYSTOPROSTATECTOMY,  ILEAL CONDUIT;  Surgeon: Alexis Frock, MD;  Location: [facility];  Service: Urology;  Laterality: N/A;    SOCIAL HISTORY: Social History   Socioeconomic History   Marital status: Married    Spouse name: Not on file   Number of children: Not on file   Years of education: Not on file   Highest education level: Not on file  Occupational History   Not on file  Tobacco Use   Smoking status: Former    Packs/day: 0.50    Years: 25.00    Total pack years: 12.50    Types: Cigarettes    Quit date: 06/02/2004    Years since quitting: 18.4   Smokeless tobacco: Never  Substance and Sexual Activity   Alcohol use: Yes    Alcohol/week: 2.0 standard drinks of alcohol    Types: 2 Standard drinks or equivalent per week   Drug use: No   Sexual activity: Not on file  Other Topics Concern   Not on file  Social History Narrative   Not on file   Social Determinants of Health   Financial Resource Strain: Not on file  Food Insecurity: No Food Insecurity (05/08/2022)   Hunger Vital Sign    Worried About Running Out of Food in the Last Year: Never true    Ran Out of Food in the Last Year: Never true  Transportation Needs: Not on file  Physical Activity: Not on file  Stress: Not on file  Social Connections: Not on file  Intimate Partner Violence: Not on file    FAMILY HISTORY: Family History  Problem Relation Age of Onset   Cancer Father        unk type   Colon cancer Maternal Uncle    Diabetes Neg Hx     ALLERGIES:  has No Known Allergies.  MEDICATIONS:  Current Outpatient Medications  Medication Sig Dispense Refill   Clotrimazole 1 % LOTN Apply 1 Application topically 2 (two) times daily as needed. 30 mL 0   diphenoxylate-atropine  (LOMOTIL) 2.5-0.025 MG tablet 1 to 2 PO QID prn diarrhea 60 tablet 3   furosemide (LASIX) 20 MG tablet Take 2 tablets (40 mg total) by mouth daily. (Patient not taking: Reported on 04/24/2022) 90 tablet 1   HYDROmorphone (DILAUDID) 2 MG tablet Take 1 tablet (2 mg total) by mouth every 4 (four) hours as needed for severe pain. 30 tablet 0   loperamide (IMODIUM) 2 MG capsule Take 1 capsule (2 mg total) by mouth as needed for diarrhea or loose stools. 60 capsule 1   morphine (MS CONTIN) 30 MG 12 hr tablet Take 1 tablet (30 mg total) by mouth every 8 (eight) hours. 90 tablet 0   nystatin (MYCOSTATIN/NYSTOP) powder Apply 1 Application topically 3 (three) times daily. 30 g 0   ondansetron (ZOFRAN) 8 MG  tablet Take 1 tablet (8 mg total) by mouth every 8 (eight) hours as needed for nausea or vomiting. 30 tablet 0   No current facility-administered medications for this visit.    REVIEW OF SYSTEMS:   Constitutional: ( - ) fevers, ( - )  chills , ( - ) night sweats Eyes: ( - ) blurriness of vision, ( - ) double vision, ( - ) watery eyes Ears, nose, mouth, throat, and face: ( - ) mucositis, ( - ) sore throat Respiratory: ( - ) cough, ( - ) dyspnea, ( - ) wheezes Cardiovascular: ( - ) palpitation, ( - ) chest discomfort, ( - ) lower extremity swelling Gastrointestinal:  ( - ) nausea, ( - ) heartburn, ( - ) change in bowel habits Skin: ( - ) abnormal skin rashes Lymphatics: ( - ) new lymphadenopathy, ( - ) easy bruising Neurological: ( - ) numbness, ( - ) tingling, ( - ) new weaknesses Behavioral/Psych: ( - ) mood change, ( - ) new changes  All other systems were reviewed with the patient and are negative.  PHYSICAL EXAMINATION: ECOG PERFORMANCE STATUS: 1 - Symptomatic but completely ambulatory  There were no vitals filed for this visit.  There were no vitals filed for this visit.   GENERAL: Well-appearing elderly Caucasian male, alert, no distress and comfortable SKIN: skin color, texture, turgor  are normal, no rashes or significant lesions EYES: conjunctiva are pink and non-injected, sclera clear LUNGS: clear to auscultation and percussion with normal breathing effort HEART: regular rate & rhythm and no murmurs and no lower extremity edema Musculoskeletal: no cyanosis of digits and no clubbing  PSYCH: alert & oriented x 3, fluent speech NEURO: no focal motor/sensory deficits  LABORATORY DATA:  I have reviewed the data as listed    Latest Ref Rng & Units 10/24/2022    8:40 AM 10/03/2022    8:50 AM 09/13/2022    8:11 AM  CBC  WBC 4.0 - 10.5 K/uL 7.1  5.3  4.5   Hemoglobin 13.0 - 17.0 g/dL 10.9  12.2  12.2   Hematocrit 39.0 - 52.0 % 31.1  34.6  34.5   Platelets 150 - 400 K/uL 255  203  224        Latest Ref Rng & Units 10/24/2022    8:40 AM 10/03/2022    8:50 AM 09/13/2022    8:11 AM  CMP  Glucose 70 - 99 mg/dL 177  150  174   BUN 8 - 23 mg/dL 20  18  18   $ Creatinine 0.61 - 1.24 mg/dL 0.79  0.72  0.72   Sodium 135 - 145 mmol/L 137  138  137   Potassium 3.5 - 5.1 mmol/L 4.3  4.2  4.0   Chloride 98 - 111 mmol/L 106  106  104   CO2 22 - 32 mmol/L 27  29  29   $ Calcium 8.9 - 10.3 mg/dL 7.3  7.9  8.1   Total Protein 6.5 - 8.1 g/dL 5.2  5.4  5.3   Total Bilirubin 0.3 - 1.2 mg/dL 0.3  0.3  0.3   Alkaline Phos 38 - 126 U/L 66  53  52   AST 15 - 41 U/L 22  19  18   $ ALT 0 - 44 U/L 21  16  16     $ RADIOGRAPHIC STUDIES: No results found.  ASSESSMENT & PLAN Danny Wood 72 y.o. male with medical history significant for static urothelial cancer who  presents for a follow up visit.   # Stage IV Urothelial Cancer -- Patient has extensively pretreated disease.   --Today he presents for cycle 2 day 1 of Taxotere. -- Labs today show white blood cell count 7.1, hemoglobin 10.9, MCV 86.4, and platelets of 255.  AST and ALT are within normal limits with a creatinine of 0.79 -- Plan to return to clinic in 3 weeks time for cycle 3 of chemotherapy.  # Pain Control -- Patient is  currently following with palliative care. -- Continue Dilaudid 2 mg every 4 hours as needed -- Long-acting pain medication with MS Contin 30 mg every 12 hours  #Supportive Care -- chemotherapy education complete -- port placed -- zofran 7m q8H PRN and compazine 115mPO q6H for nausea -- EMLA cream for port   No orders of the defined types were placed in this encounter.   All questions were answered. The patient knows to call the clinic with any problems, questions or concerns.  A total of more than 30 minutes were spent on this encounter with face-to-face time and non-face-to-face time, including preparing to see the patient, ordering tests and/or medications, counseling the patient and coordination of care as outlined above.   JoLedell PeoplesMD Department of Hematology/Oncology [facility] WeMem[facility] Medical Centerhone: [phone]: [phone]: [email or web address]  10/24/2022 3:58 PM

## 2022-10-26 ENCOUNTER — Inpatient Hospital Stay: Payer: Medicare Other

## 2022-10-26 NOTE — Progress Notes (Signed)
LVM for patient at 1335 today after he did not show up for injection at 1315. Advised him to arrive by 1400 if he did intend to come. Attempted to reach spouse in contact info but she did not answer either. Also tried calling back at 1350, no answer and did not leave a second message.

## 2022-10-30 ENCOUNTER — Telehealth: Payer: Self-pay

## 2022-10-30 ENCOUNTER — Other Ambulatory Visit: Payer: Self-pay | Admitting: Nurse Practitioner

## 2022-10-30 ENCOUNTER — Telehealth: Payer: Self-pay | Admitting: Hematology and Oncology

## 2022-10-30 ENCOUNTER — Other Ambulatory Visit: Payer: Self-pay

## 2022-10-30 DIAGNOSIS — Z515 Encounter for palliative care: Secondary | ICD-10-CM

## 2022-10-30 DIAGNOSIS — C679 Malignant neoplasm of bladder, unspecified: Secondary | ICD-10-CM

## 2022-10-30 DIAGNOSIS — G893 Neoplasm related pain (acute) (chronic): Secondary | ICD-10-CM

## 2022-10-30 MED ORDER — HYDROMORPHONE HCL 2 MG PO TABS: 2.0000 mg | ORAL_TABLET | ORAL | 0 refills | Status: DC | PRN

## 2022-10-30 NOTE — Telephone Encounter (Signed)
Telephone call to patient. Patient has been sick the last 6 days Chills, malaise, dizziness and lack of appetite. He did not take his temperature until today. It was 99.4. Patient scheduled to see Danny Kraft PA on 10/31/22. Patient understands to call EMS for any urgent needs prior to this appointment.

## 2022-10-30 NOTE — Telephone Encounter (Signed)
Called patient to reschedule missed injection appointment from 2/24. Left voicemail with contact information for patient to call bacf to reschedule. Will f/u before EOD.

## 2022-10-31 ENCOUNTER — Inpatient Hospital Stay (HOSPITAL_BASED_OUTPATIENT_CLINIC_OR_DEPARTMENT_OTHER): Payer: Medicare Other | Admitting: Physician Assistant

## 2022-10-31 ENCOUNTER — Telehealth: Payer: Self-pay

## 2022-10-31 ENCOUNTER — Inpatient Hospital Stay: Payer: Medicare Other

## 2022-10-31 ENCOUNTER — Other Ambulatory Visit: Payer: Self-pay

## 2022-10-31 VITALS — BP 100/57 | HR 61 | Temp 98.0°F | Resp 16 | Ht 74.0 in | Wt 162.5 lb

## 2022-10-31 DIAGNOSIS — E860 Dehydration: Secondary | ICD-10-CM | POA: Diagnosis not present

## 2022-10-31 DIAGNOSIS — C670 Malignant neoplasm of trigone of bladder: Secondary | ICD-10-CM | POA: Diagnosis not present

## 2022-10-31 DIAGNOSIS — C679 Malignant neoplasm of bladder, unspecified: Secondary | ICD-10-CM

## 2022-10-31 DIAGNOSIS — Z95828 Presence of other vascular implants and grafts: Secondary | ICD-10-CM

## 2022-10-31 DIAGNOSIS — Z5111 Encounter for antineoplastic chemotherapy: Secondary | ICD-10-CM | POA: Diagnosis not present

## 2022-10-31 DIAGNOSIS — C772 Secondary and unspecified malignant neoplasm of intra-abdominal lymph nodes: Secondary | ICD-10-CM

## 2022-10-31 LAB — CMP (CANCER CENTER ONLY)
ALT: 20 U/L (ref 0–44)
AST: 20 U/L (ref 15–41)
Albumin: 3 g/dL — ABNORMAL LOW (ref 3.5–5.0)
Alkaline Phosphatase: 46 U/L (ref 38–126)
Anion gap: 5 (ref 5–15)
BUN: 26 mg/dL — ABNORMAL HIGH (ref 8–23)
CO2: 30 mmol/L (ref 22–32)
Calcium: 8 mg/dL — ABNORMAL LOW (ref 8.9–10.3)
Chloride: 98 mmol/L (ref 98–111)
Creatinine: 0.79 mg/dL (ref 0.61–1.24)
GFR, Estimated: >60 mL/min (ref >60)
Glucose, Bld: 142 mg/dL — ABNORMAL HIGH (ref 70–99)
Potassium: 4.3 mmol/L (ref 3.5–5.1)
Sodium: 133 mmol/L — ABNORMAL LOW (ref 135–145)
Total Bilirubin: 0.4 mg/dL (ref 0.3–1.2)
Total Protein: 5.1 g/dL — ABNORMAL LOW (ref 6.5–8.1)

## 2022-10-31 LAB — CBC WITH DIFFERENTIAL (CANCER CENTER ONLY)
Abs Immature Granulocytes: 0 10*3/uL (ref 0.00–0.07)
Basophils Absolute: 0 10*3/uL (ref 0.0–0.1)
Basophils Relative: 0 %
Eosinophils Absolute: 0 10*3/uL (ref 0.0–0.5)
Eosinophils Relative: 0 %
HCT: 26.4 % — ABNORMAL LOW (ref 39.0–52.0)
Hemoglobin: 9.3 g/dL — ABNORMAL LOW (ref 13.0–17.0)
Lymphocytes Relative: 32 %
Lymphs Abs: 0.3 10*3/uL — ABNORMAL LOW (ref 0.7–4.0)
MCH: 30.1 pg (ref 26.0–34.0)
MCHC: 35.2 g/dL (ref 30.0–36.0)
MCV: 85.4 fL (ref 80.0–100.0)
Monocytes Absolute: 0.1 10*3/uL (ref 0.1–1.0)
Monocytes Relative: 14 %
Neutro Abs: 0.4 10*3/uL — CL (ref 1.7–7.7)
Neutrophils Relative %: 54 %
Platelet Count: 170 10*3/uL (ref 150–400)
RBC: 3.09 MIL/uL — ABNORMAL LOW (ref 4.22–5.81)
RDW: 14.3 % (ref 11.5–15.5)
Smear Review: NORMAL
WBC Count: 0.8 10*3/uL — CL (ref 4.0–10.5)
nRBC: 0 % (ref 0.0–0.2)

## 2022-10-31 LAB — SAMPLE TO BLOOD BANK

## 2022-10-31 MED ORDER — SODIUM CHLORIDE 0.9% FLUSH
10.0000 mL | Freq: Once | INTRAVENOUS | Status: AC | PRN
  Administered 2022-10-31: 10 mL

## 2022-10-31 MED ORDER — HEPARIN SOD (PORK) LOCK FLUSH 100 UNIT/ML IV SOLN
500.0000 [IU] | Freq: Once | INTRAVENOUS | Status: AC | PRN
  Administered 2022-10-31: 500 [IU]

## 2022-10-31 MED ORDER — SODIUM CHLORIDE 0.9% FLUSH
10.0000 mL | Freq: Once | INTRAVENOUS | Status: AC
  Administered 2022-10-31: 10 mL

## 2022-10-31 MED ORDER — PEGFILGRASTIM-PBBK 6 MG/0.6ML ~~LOC~~ SOSY
6.0000 mg | PREFILLED_SYRINGE | Freq: Once | SUBCUTANEOUS | Status: AC
  Administered 2022-10-31: 6 mg via SUBCUTANEOUS
  Filled 2022-10-31: qty 0.6

## 2022-10-31 MED ORDER — SODIUM CHLORIDE 0.9 % IV SOLN: Freq: Once | INTRAVENOUS | Status: AC

## 2022-10-31 NOTE — Telephone Encounter (Signed)
CRITICAL VALUE STICKER  CRITICAL VALUE:   WBC  0.8  RECEIVER (on-site recipient of call):   Rondel Baton, LPN  [place] NOTIFIED: 10/31/22    11:09  MESSENGER (representative from lab):  Lelan Pons  MD NOTIFIED: Sherol Dade,  TIME OF NOTIFICATION:   11:10  RESPONSE:     ANC  is 0.4 but lab will have to re ck and will call back if there are changes

## 2022-10-31 NOTE — Progress Notes (Signed)
Symptom Management Consult note [place]    Patient Care Team: [name], MD as PCP - General (Family Medicine)    Name / MRN / DOBAllison Wood  [id number]  August 22, 1951   Date of visit: 10/31/2022   Chief Complaint/Reason for visit: chills, generalized weakness   Current Therapy: Taxotere with pegfilgrastim-pbbk   Last treatment:  Day 1   Cycle 2 on 10/24/22   ASSESSMENT & PLAN: Patient is a 72 y.o. male  with oncologic history of stage IV High Grade Urothelial Carcinoma followed by Dr. Lorenso Courier.  I have viewed most recent oncology note and lab work.    ## Stage IV High Grade Urothelial Carcinoma - Recently started Taxotere due to disease progression  - Next appointment with oncologist is 11/14/22 - Patient reported slightly worsening neuropathy, still able to perform ADLs.   #Pancytopenia - CBC today with WBC 0.8, HgB 9.3, ANC 0.4. Abnormalities likely related to treatment. - Patient missed his G-CSF injection 02/24 because he was feeling poorly. He received injection today. - Discussed neutropenic precautions at length with patient. - Blood cultures collected today as he reported new significant chills in setting of pancytopenia. No respiratory or urinary symptoms, so additional work up is not neccessary at this time.   #Hypocalcemia -CMP shows this is improved today at 8.0 compared to 7.3 x 1 week ago.  - Patient is taking 2 TUMS nightly and will continue to do so. - No other significant electrolyte derangement.  #Symptom management: Chills, fatigue, generalized weakness - Afebrile, nontoxic-appearing.  Blood pressure is soft at 98 /58 - No focal weakness.  Clinically patient appears dehydrated.  No signs of mucositis.  He was given a liter of IV fluids.  On reassessment patient admitted to feeling better. - Continues to have abdominal pain which is chronic and unchanged today.  Nontender abdominal exam. - Offered patient emotional  support while discussing symptom management during treatment.   Discussed plan with oncologist who agrees. Strict ED precautions discussed should symptoms worsen.   Heme/Onc History: Oncology History  Cancer of trigone of urinary bladder ([place])  06/01/2014 Initial Diagnosis   Bladder cancer ([place])   02/17/2018 - 07/24/2018 Chemotherapy   The patient had palonosetron (ALOXI) injection 0.25 mg, 0.25 mg, Intravenous,  Once, 8 of 8 cycles Administration: 0.25 mg (02/17/2018), 0.25 mg (03/10/2018), 0.25 mg (03/31/2018), 0.25 mg (04/20/2018), 0.25 mg (05/13/2018), 0.25 mg (06/03/2018), 0.25 mg (06/24/2018), 0.25 mg (07/22/2018) pegfilgrastim (NEULASTA) injection 6 mg, 6 mg, Subcutaneous, Once, 1 of 1 cycle Administration: 6 mg (07/24/2018) pegfilgrastim-cbqv (UDENYCA) injection 6 mg, 6 mg, Subcutaneous, Once, 1 of 1 cycle Administration: 6 mg (06/26/2018) CARBOplatin (PARAPLATIN) 510 mg in sodium chloride 0.9 % 250 mL chemo infusion, 510 mg (84.5 % of original dose 598.5 mg), Intravenous,  Once, 8 of 8 cycles Dose modification:   (original dose 598.5 mg, Cycle 1), 506 mg (original dose 598.5 mg, Cycle 2),   (original dose 598.5 mg, Cycle 3, Reason: Provider Judgment), 550 mg (original dose 598.5 mg, Cycle 5) Administration: 510 mg (02/17/2018), 510 mg (03/10/2018), 590 mg (03/31/2018), 550 mg (04/20/2018), 550 mg (05/13/2018), 550 mg (06/03/2018), 550 mg (06/24/2018), 550 mg (07/22/2018) gemcitabine (GEMZAR) 2,204 mg in sodium chloride 0.9 % 250 mL chemo infusion, 1,000 mg/m2 = 2,204 mg, Intravenous,  Once, 8 of 8 cycles Administration: 2,204 mg (02/17/2018), 2,204 mg (02/24/2018), 2,204 mg (03/10/2018), 2,204 mg (03/31/2018), 2,204 mg (04/20/2018), 2,204 mg (05/13/2018), 2,204 mg (06/03/2018), 2,204 mg (06/24/2018), 2,204  mg (07/22/2018)  for chemotherapy treatment.    04/01/2019 - 08/06/2019 Chemotherapy   The patient had pembrolizumab (KEYTRUDA) 200 mg in sodium chloride 0.9 % 50 mL chemo infusion, 200 mg, Intravenous,  Once, 7 of 9 cycles Administration: 200 mg (04/01/2019), 200 mg (04/23/2019), 200 mg (05/14/2019), 200 mg (06/04/2019), 200 mg (06/25/2019), 200 mg (07/16/2019), 200 mg (08/06/2019)  for chemotherapy treatment.    02/11/2020 - 12/20/2021 Chemotherapy   Patient is on Treatment Plan : UROTHELIAL LOCALLY ADVANCED / METASTATIC Enfortumab q28d     02/20/2021 Cancer Staging   Staging form: Urinary Bladder, AJCC 7th Edition - Pathologic: Stage IV (T2, N0, M1) - Signed by Wyatt Portela, MD on 02/20/2021   03/12/2022 - 04/02/2022 Chemotherapy   Patient is on Treatment Plan : BLADDER Sacituzumab govitecan-hziy [name]) q21d     03/12/2022 - 08/23/2022 Chemotherapy   Patient is on Treatment Plan : BLADDER Sacituzumab govitecan-hziy [name]) D1,8 q21d     10/03/2022 -  Chemotherapy   Patient is on Treatment Plan : BREAST Docetaxel (75) q21d         Interval history-: Danny Wood is a 72 y.o. male with oncologic history as above presenting to [facility] today with chief complaint of chills, generalized weakness, fatigue x 1 week. Patient presents unaccompanied to clinic.  Patient states his symptoms have been progressively worsening.  He was scheduled to receive his pegfilgrastim injection on 2/24 however he was feeling too poorly to come into clinic.  Patient admits to having continued abdominal and leg pain which has been ongoing and is unchanged today.  His pain is well-controlled with his current pain regimen.  He does not feel that he needs to see palliative care sooner than his neck scheduled visit.  Patient states he has a hard time eating because everything tastes acidic.  He has recently started drinking "Naturade" which is a plant protein based shake.  He can also tolerate things like fruit cups and ice cream.  His fluid intake over the last few days was less than usual.  He denies nausea or vomiting.  He does admit to having significant chills for the last several days.  He has been having to sleep  under 2 comforters which is new.  He checked his temperature and has not had any fever.  He continues to have normal urine output and his bag.  He notes urine color is dark the first few hours after receiving chemotherapy then returns back to normal. He continues to have 1 episode of diarrhea daily, usually at 3:45 AM. Patient also admits that the numbness in his fingers and toes has slightly worsened after most recent chemo.  He still able to perform his ADLs.  He also noticed that his teeth started chipping when eating hard foods such as biting into an apple.  Denies any dental pain or abscess. No over-the-counter medications taken for symptoms prior to arrival.    ROS  All other systems are reviewed and are negative for acute change except as noted in the HPI.    No Known Allergies   Past Medical History:  Diagnosis Date   At risk for sleep apnea    STOP-BANG= 4       SENT TO PCP 05-16-2014   bladder ca dx'd 05/17/14   met dz 12/2017   Bladder disorder    Hematuria    History of renal cell carcinoma    2003  S/P  PARTIAL RIGHT NEPHRECTOMY   Presence of surgical  incision    lumbar diskectomy 05-11-2014-  bandage present   Urgency of urination      Past Surgical History:  Procedure Laterality Date   CYSTOSCOPY N/A 09/09/2014   Procedure: CYSTOSCOPY WITH INDOCYANINE GREEN DYE INJECTION AND URETHRAL DILATION;  Surgeon: Alexis Frock, MD;  Location: [facility];  Service: Urology;  Laterality: N/A;   CYSTOSCOPY WITH BIOPSY N/A 05/17/2014   Procedure: CYSTO WITH BLADDER BIOPSY;  Surgeon: Malka So, MD;  Location: [facility];  Service: Urology;  Laterality: N/A;   EVALUATION UNDER ANESTHESIA WITH HEMORRHOIDECTOMY  07/07/2012   Procedure: EXAM UNDER ANESTHESIA WITH HEMORRHOIDECTOMY;  Surgeon: Joyice Faster. Cornett, MD;  Location: [place];  Service: General;  Laterality: N/A;   IR IMAGING GUIDED PORT INSERTION  02/20/2018   LUMBAR MICRODISCECTOMY  05-11-2014   L4 -- L5 (partial)    PARTIAL NEPHRECTOMY Right 2003   ROBOT ASSISTED LAPAROSCOPIC COMPLETE CYSTECT ILEAL CONDUIT N/A 09/09/2014   Procedure: ROBOTIC ASSISTED LAPAROSCOPIC COMPLETE CYSTOPROSTATECTOMY,  ILEAL CONDUIT;  Surgeon: Alexis Frock, MD;  Location: [facility];  Service: Urology;  Laterality: N/A;    Social History   Socioeconomic History   Marital status: Married    Spouse name: Not on file   Number of children: Not on file   Years of education: Not on file   Highest education level: Not on file  Occupational History   Not on file  Tobacco Use   Smoking status: Former    Packs/day: 0.50    Years: 25.00    Total pack years: 12.50    Types: Cigarettes    Quit date: 06/02/2004    Years since quitting: 18.4   Smokeless tobacco: Never  Substance and Sexual Activity   Alcohol use: Yes    Alcohol/week: 2.0 standard drinks of alcohol    Types: 2 Standard drinks or equivalent per week   Drug use: No   Sexual activity: Not on file  Other Topics Concern   Not on file  Social History Narrative   Not on file   Social Determinants of Health   Financial Resource Strain: Not on file  Food Insecurity: No Food Insecurity (05/08/2022)   Hunger Vital Sign    Worried About Running Out of Food in the Last Year: Never true    Ran Out of Food in the Last Year: Never true  Transportation Needs: Not on file  Physical Activity: Not on file  Stress: Not on file  Social Connections: Not on file  Intimate Partner Violence: Not on file    Family History  Problem Relation Age of Onset   Cancer Father        unk type   Colon cancer Maternal Uncle    Diabetes Neg Hx      Current Outpatient Medications:    Clotrimazole 1 % LOTN, Apply 1 Application topically 2 (two) times daily as needed., Disp: 30 mL, Rfl: 0   diphenoxylate-atropine (LOMOTIL) 2.5-0.025 MG tablet, 1 to 2 PO QID prn diarrhea, Disp: 60 tablet, Rfl: 3   furosemide (LASIX) 20 MG tablet, Take 2 tablets (40 mg total) by mouth daily. (Patient not  taking: Reported on 04/24/2022), Disp: 90 tablet, Rfl: 1   HYDROmorphone (DILAUDID) 2 MG tablet, Take 1 tablet (2 mg total) by mouth every 4 (four) hours as needed for severe pain., Disp: 60 tablet, Rfl: 0   loperamide (IMODIUM) 2 MG capsule, Take 1 capsule (2 mg total) by mouth as needed for diarrhea or loose stools., Disp:  60 capsule, Rfl: 1   morphine (MS CONTIN) 30 MG 12 hr tablet, Take 1 tablet (30 mg total) by mouth every 8 (eight) hours., Disp: 90 tablet, Rfl: 0   nystatin (MYCOSTATIN/NYSTOP) powder, Apply 1 Application topically 3 (three) times daily., Disp: 30 g, Rfl: 0   ondansetron (ZOFRAN) 8 MG tablet, Take 1 tablet (8 mg total) by mouth every 8 (eight) hours as needed for nausea or vomiting., Disp: 30 tablet, Rfl: 0  PHYSICAL EXAM: ECOG FS:1 - Symptomatic but completely ambulatory    Vitals:   10/31/22 1105 10/31/22 1308  BP: (!) 98/58 (!) 100/57  Pulse: 71 61  Resp: 18 16  Temp: 97.8 F (36.6 C) 98 F (36.7 C)  TempSrc: Temporal Oral  SpO2: 100% 100%  Weight: 162 lb 8 oz (73.7 kg)   Height: '6\' 2"'$  (1.88 m)    Physical Exam Vitals and nursing note reviewed.  Constitutional:      Appearance: He is well-developed. He is not ill-appearing or toxic-appearing.  HENT:     Head: Normocephalic.     Nose: Nose normal.     Mouth/Throat:     Mouth: Mucous membranes are dry.     Comments: Overall poor dentition. Multiple caries. No dental abscess or gross infection.  No sores or erythema. Eyes:     Conjunctiva/sclera: Conjunctivae normal.  Neck:     Vascular: No JVD.  Cardiovascular:     Rate and Rhythm: Normal rate and regular rhythm.     Pulses: Normal pulses.     Heart sounds: Normal heart sounds.  Pulmonary:     Effort: Pulmonary effort is normal.     Breath sounds: Normal breath sounds.  Abdominal:     General: There is no distension.     Palpations: Abdomen is soft.     Tenderness: There is no abdominal tenderness.     Comments: Ileal conduit. Normal colored  urine in bag  Musculoskeletal:     Cervical back: Normal range of motion.     Comments: RLE. Chronic and unchanged per patient  Skin:    General: Skin is warm and dry.  Neurological:     Mental Status: He is oriented to person, place, and time.        LABORATORY DATA: I have reviewed the data as listed    Latest Ref Rng & Units 10/31/2022   10:49 AM 10/24/2022    8:40 AM 10/03/2022    8:50 AM  CBC  WBC 4.0 - 10.5 K/uL 0.8  7.1  5.3   Hemoglobin 13.0 - 17.0 g/dL 9.3  10.9  12.2   Hematocrit 39.0 - 52.0 % 26.4  31.1  34.6   Platelets 150 - 400 K/uL 170  255  203         Latest Ref Rng & Units 10/31/2022   10:49 AM 10/24/2022    8:40 AM 10/03/2022    8:50 AM  CMP  Glucose 70 - 99 mg/dL 142  177  150   BUN 8 - 23 mg/dL '26  20  18   '$ Creatinine 0.61 - 1.24 mg/dL 0.79  0.79  0.72   Sodium 135 - 145 mmol/L 133  137  138   Potassium 3.5 - 5.1 mmol/L 4.3  4.3  4.2   Chloride 98 - 111 mmol/L 98  106  106   CO2 22 - 32 mmol/L '30  27  29   '$ Calcium 8.9 - 10.3 mg/dL 8.0  7.3  7.9  Total Protein 6.5 - 8.1 g/dL 5.1  5.2  5.4   Total Bilirubin 0.3 - 1.2 mg/dL 0.4  0.3  0.3   Alkaline Phos 38 - 126 U/L 46  66  53   AST 15 - 41 U/L '20  22  19   '$ ALT 0 - 44 U/L '20  21  16        '$ RADIOGRAPHIC STUDIES (from last 24 hours if applicable) I have personally reviewed the radiological images as listed and agreed with the findings in the report. No results found.      Visit Diagnosis: 1. Dehydration   2. Cancer of trigone of urinary bladder ([facility])   3. Bladder cancer metastasized to intra-abdominal lymph nodes ([place])   4. Port-A-Cath in place       All questions were answered. The patient knows to call the clinic with any problems, questions or concerns. No barriers to learning was detected.  I have spent a total of 20 minutes minutes of face-to-face and non-face-to-face time, preparing to see the patient, obtaining and/or reviewing separately obtained history, performing a medically  appropriate examination, counseling and educating the patient, ordering tests, documenting clinical information in the electronic health record, and care coordination (communications with other health care professionals or caregivers).    Thank you for allowing me to participate in the care of this patient.    Barrie Folk, PA-C Department of Hematology/Oncology [facility] at [facility] Phone: [phone]  [phone]    10/31/2022 2:08 PM

## 2022-11-01 ENCOUNTER — Telehealth: Payer: Self-pay

## 2022-11-01 NOTE — Telephone Encounter (Signed)
Spoke with pt via telephone regarding pt's call to Alda Lea, NP in Palliative Care.  Pt stated he requested a refill on Percocet and not Dilaudid.  Informed pt that he's been on Dilaudid since January 2024.  Pt stated he wants Percocet.  Informed pt that unfortunately, he cannot have both Percocet and Dilaudid d/t pt picked up his prescription from his pharmacy for Dilaudid.  Pt asked if he could return the Dilaudid to his local pharmacy would [facility] then prescribe Percocet.  Informed pt that Lexine Baton is currently out of the office but will further discuss his pain management on Monday 11/04/2022.  Instructed pt to not return the Dilaudid to his pharmacy d/t pt will be without pain medication until he speaks with Lexine Baton.  Pt verbalized understanding and had no further questions or concerns.

## 2022-11-04 ENCOUNTER — Other Ambulatory Visit: Payer: Self-pay | Admitting: Nurse Practitioner

## 2022-11-04 DIAGNOSIS — Z515 Encounter for palliative care: Secondary | ICD-10-CM

## 2022-11-04 DIAGNOSIS — G893 Neoplasm related pain (acute) (chronic): Secondary | ICD-10-CM

## 2022-11-04 DIAGNOSIS — C679 Malignant neoplasm of bladder, unspecified: Secondary | ICD-10-CM

## 2022-11-04 MED ORDER — OXYCODONE-ACETAMINOPHEN 10-325 MG PO TABS: 1.0000 | ORAL_TABLET | ORAL | 0 refills | Status: DC | PRN

## 2022-11-05 LAB — CULTURE, BLOOD (SINGLE): Culture: NO GROWTH

## 2022-11-13 MED FILL — Dexamethasone Sodium Phosphate Inj 100 MG/10ML: INTRAMUSCULAR | Qty: 1 | Status: AC

## 2022-11-14 ENCOUNTER — Inpatient Hospital Stay: Payer: Medicare Other

## 2022-11-14 ENCOUNTER — Inpatient Hospital Stay: Payer: Medicare Other | Attending: Oncology | Admitting: Hematology and Oncology

## 2022-11-14 VITALS — BP 102/55 | HR 62 | Temp 98.0°F | Resp 18

## 2022-11-14 DIAGNOSIS — Z5111 Encounter for antineoplastic chemotherapy: Secondary | ICD-10-CM | POA: Diagnosis not present

## 2022-11-14 DIAGNOSIS — G8929 Other chronic pain: Secondary | ICD-10-CM | POA: Diagnosis not present

## 2022-11-14 DIAGNOSIS — C670 Malignant neoplasm of trigone of bladder: Secondary | ICD-10-CM | POA: Diagnosis present

## 2022-11-14 DIAGNOSIS — Z885 Allergy status to narcotic agent status: Secondary | ICD-10-CM | POA: Diagnosis not present

## 2022-11-14 DIAGNOSIS — Z9079 Acquired absence of other genital organ(s): Secondary | ICD-10-CM | POA: Insufficient documentation

## 2022-11-14 DIAGNOSIS — Z87891 Personal history of nicotine dependence: Secondary | ICD-10-CM | POA: Diagnosis not present

## 2022-11-14 DIAGNOSIS — Z95828 Presence of other vascular implants and grafts: Secondary | ICD-10-CM

## 2022-11-14 DIAGNOSIS — Z85528 Personal history of other malignant neoplasm of kidney: Secondary | ICD-10-CM | POA: Insufficient documentation

## 2022-11-14 DIAGNOSIS — C679 Malignant neoplasm of bladder, unspecified: Secondary | ICD-10-CM

## 2022-11-14 DIAGNOSIS — Z79899 Other long term (current) drug therapy: Secondary | ICD-10-CM | POA: Insufficient documentation

## 2022-11-14 LAB — PHOSPHORUS: Phosphorus: 4.2 mg/dL (ref 2.5–4.6)

## 2022-11-14 LAB — CMP (CANCER CENTER ONLY)
ALT: 18 U/L (ref 0–44)
AST: 18 U/L (ref 15–41)
Albumin: 3 g/dL — ABNORMAL LOW (ref 3.5–5.0)
Alkaline Phosphatase: 104 U/L (ref 38–126)
Anion gap: 5 (ref 5–15)
BUN: 22 mg/dL (ref 8–23)
CO2: 27 mmol/L (ref 22–32)
Calcium: 7.4 mg/dL — ABNORMAL LOW (ref 8.9–10.3)
Chloride: 106 mmol/L (ref 98–111)
Creatinine: 0.94 mg/dL (ref 0.61–1.24)
GFR, Estimated: >60 mL/min (ref >60)
Glucose, Bld: 140 mg/dL — ABNORMAL HIGH (ref 70–99)
Potassium: 4.7 mmol/L (ref 3.5–5.1)
Sodium: 138 mmol/L (ref 135–145)
Total Bilirubin: 0.2 mg/dL — ABNORMAL LOW (ref 0.3–1.2)
Total Protein: 5.6 g/dL — ABNORMAL LOW (ref 6.5–8.1)

## 2022-11-14 LAB — CBC WITH DIFFERENTIAL (CANCER CENTER ONLY)
Abs Immature Granulocytes: 0.09 10*3/uL — ABNORMAL HIGH (ref 0.00–0.07)
Basophils Absolute: 0.1 10*3/uL (ref 0.0–0.1)
Basophils Relative: 0 %
Eosinophils Absolute: 0 10*3/uL (ref 0.0–0.5)
Eosinophils Relative: 0 %
HCT: 29.8 % — ABNORMAL LOW (ref 39.0–52.0)
Hemoglobin: 9.9 g/dL — ABNORMAL LOW (ref 13.0–17.0)
Immature Granulocytes: 1 %
Lymphocytes Relative: 5 %
Lymphs Abs: 0.6 10*3/uL — ABNORMAL LOW (ref 0.7–4.0)
MCH: 29.2 pg (ref 26.0–34.0)
MCHC: 33.2 g/dL (ref 30.0–36.0)
MCV: 87.9 fL (ref 80.0–100.0)
Monocytes Absolute: 0.7 10*3/uL (ref 0.1–1.0)
Monocytes Relative: 6 %
Neutro Abs: 9.9 10*3/uL — ABNORMAL HIGH (ref 1.7–7.7)
Neutrophils Relative %: 88 %
Platelet Count: 324 10*3/uL (ref 150–400)
RBC: 3.39 MIL/uL — ABNORMAL LOW (ref 4.22–5.81)
RDW: 14.8 % (ref 11.5–15.5)
WBC Count: 11.3 10*3/uL — ABNORMAL HIGH (ref 4.0–10.5)
nRBC: 0 % (ref 0.0–0.2)

## 2022-11-14 LAB — MAGNESIUM: Magnesium: 1 mg/dL — ABNORMAL LOW (ref 1.7–2.4)

## 2022-11-14 MED ORDER — SODIUM CHLORIDE 0.9 % IV SOLN: Freq: Once | INTRAVENOUS | Status: AC

## 2022-11-14 MED ORDER — SODIUM CHLORIDE 0.9% FLUSH
10.0000 mL | Freq: Once | INTRAVENOUS | Status: AC
  Administered 2022-11-14: 10 mL

## 2022-11-14 MED ORDER — MAGNESIUM SULFATE 2 GM/50ML IV SOLN
2.0000 g | Freq: Once | INTRAVENOUS | Status: AC
  Administered 2022-11-14: 2 g via INTRAVENOUS
  Filled 2022-11-14: qty 50

## 2022-11-14 MED ORDER — SODIUM CHLORIDE 0.9 % IV SOLN
10.0000 mg | Freq: Once | INTRAVENOUS | Status: AC
  Administered 2022-11-14: 10 mg via INTRAVENOUS
  Filled 2022-11-14: qty 10

## 2022-11-14 MED ORDER — SODIUM CHLORIDE 0.9 % IV SOLN
60.0000 mg/m2 | Freq: Once | INTRAVENOUS | Status: AC
  Administered 2022-11-14: 118 mg via INTRAVENOUS
  Filled 2022-11-14: qty 11.8

## 2022-11-14 NOTE — Progress Notes (Signed)
[place] [phone]   [phone]  PROGRESS NOTE  Patient Care Team: [name], MD as PCP - General (Family Medicine)  Hematological/Oncological History # Stage IV High Grade Urothelial Carcinoma 05/17/2014: patient underwent TURBT  06/17/2014: Neoadjuvant systemic chemotherapy in the form of gemcitabine and cisplatin  09/09/2014: robotic cystoprostatectomy and bilateral lymphadenectomy. The final pathology revealed T2N0 without any lymph node involvement.  02/17/2018: Carboplatin and gemcitabine.  He completed 8 cycles of therapy in December 2019.  04/01/2019:  Pembrolizumab 200 mg every 3 weeks. Therapy stopped because of of patient preference, excellent clinical response and treatment holiday.  He developed progression of disease in June 2021.  02/11/2020: Enfortumab vedotin. He has been receiving monthly maintenance therapy up till March 2023.  He developed disease progression at that time.  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 Therapy discontinued in July 2023 for progression of disease.  08/2022: Palliative radiation therapy to right iliac and periaortic lymph node.  He completed 10 fractions for 30 Gy  03/12/2022: Sacituzumab govitecan continued x 9 cycles. D/c due to progression 09/13/2022: last visit with Dr. Alen Blew.  10/03/2022: Transition care to Dr. Lorenso Courier. Cycle 1 Day 1 of Taxotere at 60 mg/m every 3 weeks  10/24/2022: Cycle 2 Day 1 of Taxotere at 60 mg/m  11/14/2022: Cycle 3 Day 1 of Taxotere at 60 mg/m   Interval History:  Danny Wood 72 y.o. male with medical history significant for static urothelial cancer who presents for a follow up visit. The patient's last visit was on 10/24/2022. In the interim since the last visit he has continued on Taxotere.  On exam today Danny Wood reports he has been "feeling miserable".  He reports that his appetite has  been poor.  He notes that he is typically only able to eat about half a meal per day.  He reports it is typically breakfast.  He notes that he eats about three fourths of it.  Dinner he notes he is quite picky.  He is trying his best to try to intake calories and came across a product with 1600 to 1800 cal in the form of a chocolate shake.  He reports that it is "not too much volume".  He notes he is try to do his best to keep up with hydration though he does have an acidic taste in his mouth.  He is not having any nausea or vomiting but does have some occasional diarrhea.  He notes that he does have his chronic leg pain which is giving him trouble and occasionally it feels like his leg is about to "split open".  Overall he is willing and able to proceed with Taxotere treatment today.  A full 10 point ROS was otherwise negative.  MEDICAL HISTORY:  Past Medical History:  Diagnosis Date   At risk for sleep apnea    STOP-BANG= 4       SENT TO PCP 05-16-2014   bladder ca dx'd 05/17/14   met dz 12/2017   Bladder disorder    Hematuria    History of renal cell carcinoma    2003  S/P  PARTIAL RIGHT NEPHRECTOMY   Presence of surgical incision    lumbar diskectomy 05-11-2014-  bandage present   Urgency of urination     SURGICAL HISTORY: Past Surgical History:  Procedure Laterality Date   CYSTOSCOPY N/A 09/09/2014   Procedure: CYSTOSCOPY WITH INDOCYANINE GREEN DYE INJECTION AND  URETHRAL DILATION;  Surgeon: Alexis Frock, MD;  Location: [facility];  Service: Urology;  Laterality: N/A;   CYSTOSCOPY WITH BIOPSY N/A 05/17/2014   Procedure: CYSTO WITH BLADDER BIOPSY;  Surgeon: Malka So, MD;  [facility];  Service: Urology;  Laterality: N/A;   EVALUATION UNDER ANESTHESIA WITH HEMORRHOIDECTOMY  07/07/2012   Procedure: EXAM UNDER ANESTHESIA WITH HEMORRHOIDECTOMY;  Surgeon: Joyice Faster. Cornett, MD;  Location: [place];  Service: General;  Laterality: N/A;   IR IMAGING GUIDED PORT INSERTION   02/20/2018   LUMBAR MICRODISCECTOMY  05-11-2014   L4 -- L5 (partial)   PARTIAL NEPHRECTOMY Right 2003   ROBOT ASSISTED LAPAROSCOPIC COMPLETE CYSTECT ILEAL CONDUIT N/A 09/09/2014   Procedure: ROBOTIC ASSISTED LAPAROSCOPIC COMPLETE CYSTOPROSTATECTOMY,  ILEAL CONDUIT;  Surgeon: Alexis Frock, MD;  Location: [facility];  Service: Urology;  Laterality: N/A;    SOCIAL HISTORY: Social History   Socioeconomic History   Marital status: Married    Spouse name: Not on file   Number of children: Not on file   Years of education: Not on file   Highest education level: Not on file  Occupational History   Not on file  Tobacco Use   Smoking status: Former    Packs/day: 0.50    Years: 25.00    Additional pack years: 0.00    Total pack years: 12.50    Types: Cigarettes    Quit date: 06/02/2004    Years since quitting: 18.4   Smokeless tobacco: Never  Substance and Sexual Activity   Alcohol use: Yes    Alcohol/week: 2.0 standard drinks of alcohol    Types: 2 Standard drinks or equivalent per week   Drug use: No   Sexual activity: Not on file  Other Topics Concern   Not on file  Social History Narrative   Not on file   Social Determinants of Health   Financial Resource Strain: Not on file  Food Insecurity: No Food Insecurity (05/08/2022)   Hunger Vital Sign    Worried About Running Out of Food in the Last Year: Never true    Ran Out of Food in the Last Year: Never true  Transportation Needs: Not on file  Physical Activity: Not on file  Stress: Not on file  Social Connections: Not on file  Intimate Partner Violence: Not on file    FAMILY HISTORY: Family History  Problem Relation Age of Onset   Cancer Father        unk type   Colon cancer Maternal Uncle    Diabetes Neg Hx     ALLERGIES:  is allergic to dilaudid [hydromorphone].  MEDICATIONS:  Current Outpatient Medications  Medication Sig Dispense Refill   Clotrimazole 1 % LOTN Apply 1 Application topically 2 (two) times daily as  needed. 30 mL 0   diphenoxylate-atropine (LOMOTIL) 2.5-0.025 MG tablet 1 to 2 PO QID prn diarrhea 60 tablet 3   loperamide (IMODIUM) 2 MG capsule Take 1 capsule (2 mg total) by mouth as needed for diarrhea or loose stools. 60 capsule 1   morphine (MS CONTIN) 30 MG 12 hr tablet Take 1 tablet (30 mg total) by mouth every 8 (eight) hours. 90 tablet 0   nystatin (MYCOSTATIN/NYSTOP) powder Apply 1 Application topically 3 (three) times daily. 30 g 0   ondansetron (ZOFRAN) 8 MG tablet Take 1 tablet (8 mg total) by mouth every 8 (eight) hours as needed for nausea or vomiting. 30 tablet 0   oxyCODONE-acetaminophen (PERCOCET) 10-325 MG tablet  Take 1 tablet by mouth every 4 (four) hours as needed for pain. 90 tablet 0   furosemide (LASIX) 20 MG tablet Take 2 tablets (40 mg total) by mouth daily. (Patient not taking: Reported on 04/24/2022) 90 tablet 1   No current facility-administered medications for this visit.    REVIEW OF SYSTEMS:   Constitutional: ( - ) fevers, ( - )  chills , ( - ) night sweats Eyes: ( - ) blurriness of vision, ( - ) double vision, ( - ) watery eyes Ears, nose, mouth, throat, and face: ( - ) mucositis, ( - ) sore throat Respiratory: ( - ) cough, ( - ) dyspnea, ( - ) wheezes Cardiovascular: ( - ) palpitation, ( - ) chest discomfort, ( - ) lower extremity swelling Gastrointestinal:  ( - ) nausea, ( - ) heartburn, ( - ) change in bowel habits Skin: ( - ) abnormal skin rashes Lymphatics: ( - ) new lymphadenopathy, ( - ) easy bruising Neurological: ( - ) numbness, ( - ) tingling, ( - ) new weaknesses Behavioral/Psych: ( - ) mood change, ( - ) new changes  All other systems were reviewed with the patient and are negative.  PHYSICAL EXAMINATION: ECOG PERFORMANCE STATUS: 1 - Symptomatic but completely ambulatory  There were no vitals filed for this visit.  There were no vitals filed for this visit.   GENERAL: Well-appearing elderly Caucasian male, alert, no distress and  comfortable SKIN: skin color, texture, turgor are normal, no rashes or significant lesions EYES: conjunctiva are pink and non-injected, sclera clear LUNGS: clear to auscultation and percussion with normal breathing effort HEART: regular rate & rhythm and no murmurs and no lower extremity edema Musculoskeletal: no cyanosis of digits and no clubbing  PSYCH: alert & oriented x 3, fluent speech NEURO: no focal motor/sensory deficits  LABORATORY DATA:  I have reviewed the data as listed    Latest Ref Rng & Units 11/14/2022    9:09 AM 10/31/2022   10:49 AM 10/24/2022    8:40 AM  CBC  WBC 4.0 - 10.5 K/uL 11.3  0.8  7.1   Hemoglobin 13.0 - 17.0 g/dL 9.9  9.3  10.9   Hematocrit 39.0 - 52.0 % 29.8  26.4  31.1   Platelets 150 - 400 K/uL 324  170  255        Latest Ref Rng & Units 11/14/2022    9:09 AM 10/31/2022   10:49 AM 10/24/2022    8:40 AM  CMP  Glucose 70 - 99 mg/dL 140  142  177   BUN 8 - 23 mg/dL 22  26  20    Creatinine 0.61 - 1.24 mg/dL 0.94  0.79  0.79   Sodium 135 - 145 mmol/L 138  133  137   Potassium 3.5 - 5.1 mmol/L 4.7  4.3  4.3   Chloride 98 - 111 mmol/L 106  98  106   CO2 22 - 32 mmol/L 27  30  27    Calcium 8.9 - 10.3 mg/dL 7.4  8.0  7.3   Total Protein 6.5 - 8.1 g/dL 5.6  5.1  5.2   Total Bilirubin 0.3 - 1.2 mg/dL 0.2  0.4  0.3   Alkaline Phos 38 - 126 U/L 104  46  66   AST 15 - 41 U/L 18  20  22    ALT 0 - 44 U/L 18  20  21      RADIOGRAPHIC STUDIES: No results found.  ASSESSMENT &  PLAN Danny Wood 72 y.o. male with medical history significant for static urothelial cancer who presents for a follow up visit.   # Stage IV Urothelial Cancer -- Patient has extensively pretreated disease as noted above.  --Today he presents for cycle 3 day 1 of Taxotere. -- Labs today show white blood cell count 11.3, hemoglobin 9.9, MCV 87.9, and platelets of 324.  AST and ALT are within normal limits with a creatinine of 0.94 -- Plan to return to clinic in 3 weeks time for  cycle 3 of chemotherapy.  # Pain Control -- Patient is currently following with palliative care. -- Continue Dilaudid 2 mg every 4 hours as needed -- Long-acting pain medication with MS Contin 30 mg every 12 hours  #Supportive Care -- chemotherapy education complete -- port placed -- zofran 8mg  q8H PRN and compazine 10mg  PO q6H for nausea -- EMLA cream for port   No orders of the defined types were placed in this encounter.   All questions were answered. The patient knows to call the clinic with any problems, questions or concerns.  A total of more than 30 minutes were spent on this encounter with face-to-face time and non-face-to-face time, including preparing to see the patient, ordering tests and/or medications, counseling the patient and coordination of care as outlined above.   Ledell Peoples, MD Department of Hematology/Oncology [place] [facility] Phone: [phone] Pager: [phone] Email: [email or web address]  11/17/2022 5:35 PM

## 2022-11-15 NOTE — Progress Notes (Unsigned)
[place]  Telephone:(336) [phone] Fax:(336) [phone]   Name: Taja Gabel Bayona Date: 11/15/2022 MRN: [id number]  DOB: 04-27-1951  Patient Care Team: [name], MD as PCP - General (Family Medicine)   I connected with Tana Conch Bui on 11/21/22 at  2:00 PM EDT by phone and verified that I am speaking with the correct person using two identifiers.   I discussed the limitations, risks, security and privacy concerns of performing an evaluation and management service by telemedicine and the availability of in-person appointments. I also discussed with the patient that there may be a patient responsible charge related to this service. The patient expressed understanding and agreed to proceed.   Other persons participating in the visit and their role in the encounter: N/A   Patient's location: Home  Provider's location: [facility]   INTERVAL HISTORY: LIJAH SARABIA is a 72 y.o. male with oncologic medical history including bladder cancer diagnosed in 2015.  He developed stage IV high-grade urothelial carcinoma with pelvic adenopathy in 2019 despite chemotherapy treatments. Recent treatment change in July 2023 due to further progression with chemoradiation. Actively being treated with Sacituzumab.  Palliative ask to see for symptom management and goals of care.   SOCIAL HISTORY:     reports that he quit smoking about 18 years ago. His smoking use included cigarettes. He has a 12.50 pack-year smoking history. He has never used smokeless tobacco. He reports current alcohol use of about 2.0 standard drinks of alcohol per week. He reports that he does not use drugs.  ADVANCE DIRECTIVES:    CODE STATUS:   PAST MEDICAL HISTORY: Past Medical History:  Diagnosis Date   At risk for sleep apnea    STOP-BANG= 4       SENT TO PCP 05-16-2014   bladder ca dx'd 05/17/14   met dz 12/2017   Bladder disorder    Hematuria    History of renal  cell carcinoma    2003  S/P  PARTIAL RIGHT NEPHRECTOMY   Presence of surgical incision    lumbar diskectomy 05-11-2014-  bandage present   Urgency of urination     ALLERGIES:  is allergic to dilaudid [hydromorphone].  MEDICATIONS:  Current Outpatient Medications  Medication Sig Dispense Refill   Clotrimazole 1 % LOTN Apply 1 Application topically 2 (two) times daily as needed. 30 mL 0   diphenoxylate-atropine (LOMOTIL) 2.5-0.025 MG tablet 1 to 2 PO QID prn diarrhea 60 tablet 3   furosemide (LASIX) 20 MG tablet Take 2 tablets (40 mg total) by mouth daily. (Patient not taking: Reported on 04/24/2022) 90 tablet 1   loperamide (IMODIUM) 2 MG capsule Take 1 capsule (2 mg total) by mouth as needed for diarrhea or loose stools. 60 capsule 1   morphine (MS CONTIN) 30 MG 12 hr tablet Take 1 tablet (30 mg total) by mouth every 8 (eight) hours. 90 tablet 0   nystatin (MYCOSTATIN/NYSTOP) powder Apply 1 Application topically 3 (three) times daily. 30 g 0   ondansetron (ZOFRAN) 8 MG tablet Take 1 tablet (8 mg total) by mouth every 8 (eight) hours as needed for nausea or vomiting. 30 tablet 0   oxyCODONE-acetaminophen (PERCOCET) 10-325 MG tablet Take 1 tablet by mouth every 4 (four) hours as needed for pain. 90 tablet 0   No current facility-administered medications for this visit.    VITAL SIGNS: There were no vitals taken for this visit. There were no vitals filed for this visit.  Estimated body mass index is 20.86 kg/m as calculated from the following:   Height as of 10/31/22: 6\' 2"  (1.88 m).   Weight as of 10/31/22: 162 lb 8 oz (73.7 kg).   PERFORMANCE STATUS (ECOG) : 1 - Symptomatic but completely ambulatory    IMPRESSION: I connected with Mr. Pickler by phone today. He is taking things one day at a time. Shares he had a pretty rough fall on yesterday evening while in the kitchen. Reports his body just "gave out". Denies LOC, hitting head, or other injury. Was able to get right back up.  Ongoing fatigue. Has not drive to do anything.   Neoplasm related pain (abdomen) Aviyon reports his pain is well controlled on current regimen. Denies any unwanted side-effects.    We discussed at length his current pain regimen which consist of MS Contin 30 mg every 12 hours and Percocet every 4 hours as needed for breakthrough pain. He is not requiring around the clock. Mr. Korinek was previously on hydromorphone however felt Percocet worked better for him. We have made changed and since then he feels pain is controlled appropriately.    No changes to regimen. Will continue to closely monitor and support. Patient knows to contact office with any needs.    Diarrhea Improved with regimen. Reports bowel movement 1-2 times daily  Decreased appetite/weight loss  Aikam reports appetite continues to be minimal. No desire to eat. Reports feelings of early satiety. He is trying to push fluids and food however at times finds this difficult. Endorses some possible weight loss.   I discussed use of appetite stimulant. He verbalized agreement. Will start on megace twice daily. Education provided. We will continue to closely monitor.   Goals of care  2/15: We discussed Her current illness and what it means in the larger context of Her on-going co-morbidities. Natural disease trajectory and expectations were discussed. Mr. Cesario is realistic in his understanding of his cancer progression and trajectory. Most important to him at this time is to continue to treat the treatable allowing him every opportunity to continue to thrive as well as aggressively managing his symptoms to improve his quality of life.   I discussed the importance of continued conversation with family and their medical providers regarding overall plan of care and treatment options, ensuring decisions are within the context of the patients values and GOCs.  PLAN:  Lomotil 2 tablets 4 times daily as needed Imodium as  needed MS Contin 30 mg every 12 hours.  Tolerating well and pain improved.  Hydromorphone discontinued. Patient preferred to restart on Percocet.  2 mg every 4 hours as needed for breakthrough pain. Not requiring daily.  Started on oxycodone 10/325mg  1 tablet every 4-6 hours as needed.  Education provided on nutrition and importance of hydration. Pain contract completed Ondansetron 8 mg every 6 hours as needed for nausea Megace 20 mg twice daily  Ongoing goals of care and symptom management as needed I will plan to see patient back in 3-4 weeks in collaboration to other oncology appointments.  Patient knows to contact our office if needed sooner.   Patient expressed understanding and was in agreement with this plan. He also understands that He can call the clinic at any time with any questions, concerns, or complaints.     Any controlled substances utilized were prescribed in the context of palliative care. PDMP has been reviewed.   Time Total: 40 min   Visit consisted of counseling and education dealing with  the complex and emotionally intense issues of symptom management and palliative care in the setting of serious and potentially life-threatening illness.Greater than 50%  of this time was spent counseling and coordinating care related to the above assessment and plan.  Alda Lea, AGPCNP-BC  Palliative Medicine Team/[place]

## 2022-11-16 ENCOUNTER — Inpatient Hospital Stay: Payer: Medicare Other

## 2022-11-16 VITALS — BP 127/60 | HR 101 | Temp 97.7°F | Resp 15

## 2022-11-16 DIAGNOSIS — C670 Malignant neoplasm of trigone of bladder: Secondary | ICD-10-CM

## 2022-11-16 DIAGNOSIS — Z5111 Encounter for antineoplastic chemotherapy: Secondary | ICD-10-CM | POA: Diagnosis not present

## 2022-11-16 MED ORDER — PEGFILGRASTIM-PBBK 6 MG/0.6ML ~~LOC~~ SOSY
6.0000 mg | PREFILLED_SYRINGE | Freq: Once | SUBCUTANEOUS | Status: AC
  Administered 2022-11-16: 6 mg via SUBCUTANEOUS
  Filled 2022-11-16: qty 0.6

## 2022-11-17 ENCOUNTER — Encounter: Payer: Self-pay | Admitting: Hematology and Oncology

## 2022-11-17 ENCOUNTER — Encounter: Payer: Self-pay | Admitting: Oncology

## 2022-11-21 ENCOUNTER — Inpatient Hospital Stay (HOSPITAL_BASED_OUTPATIENT_CLINIC_OR_DEPARTMENT_OTHER): Payer: Medicare Other | Admitting: Nurse Practitioner

## 2022-11-21 ENCOUNTER — Encounter: Payer: Self-pay | Admitting: Nurse Practitioner

## 2022-11-21 DIAGNOSIS — R530 Neoplastic (malignant) related fatigue: Secondary | ICD-10-CM

## 2022-11-21 DIAGNOSIS — Z515 Encounter for palliative care: Secondary | ICD-10-CM | POA: Diagnosis not present

## 2022-11-21 DIAGNOSIS — C679 Malignant neoplasm of bladder, unspecified: Secondary | ICD-10-CM | POA: Diagnosis not present

## 2022-11-21 DIAGNOSIS — C772 Secondary and unspecified malignant neoplasm of intra-abdominal lymph nodes: Secondary | ICD-10-CM

## 2022-11-21 DIAGNOSIS — G893 Neoplasm related pain (acute) (chronic): Secondary | ICD-10-CM | POA: Diagnosis not present

## 2022-11-21 DIAGNOSIS — R630 Anorexia: Secondary | ICD-10-CM | POA: Diagnosis not present

## 2022-11-21 MED ORDER — OXYCODONE-ACETAMINOPHEN 10-325 MG PO TABS: 1.0000 | ORAL_TABLET | ORAL | 0 refills | Status: DC | PRN

## 2022-11-21 MED ORDER — MORPHINE SULFATE ER 30 MG PO TBCR: 30.0000 mg | EXTENDED_RELEASE_TABLET | Freq: Two times a day (BID) | ORAL | 0 refills | Status: DC

## 2022-11-21 MED ORDER — MEGESTROL ACETATE 20 MG PO TABS: 20.0000 mg | ORAL_TABLET | Freq: Two times a day (BID) | ORAL | 0 refills | Status: DC

## 2022-12-04 MED FILL — Dexamethasone Sodium Phosphate Inj 100 MG/10ML: INTRAMUSCULAR | Qty: 1 | Status: AC

## 2022-12-04 NOTE — Progress Notes (Unsigned)
[place]  [phone] [phone]) [phone]   Name: Danny Wood Date: 12/04/2022 MRN: [id number]  DOB: June 17, 1951  Patient Care Team: [name], MD as PCP - General (Family Medicine)   INTERVAL HISTORY: Danny Wood is a 72 y.o. male with oncologic medical history including bladder cancer diagnosed in 2015.  He developed stage IV high-grade urothelial carcinoma with pelvic adenopathy in 2019 despite chemotherapy treatments. Recent treatment change in July 2023 due to further progression with chemoradiation. Actively being treated with Sacituzumab.  Palliative ask to see for symptom management and goals of care.   SOCIAL HISTORY:     reports that he quit smoking about 18 years ago. His smoking use included cigarettes. He has a 12.50 pack-year smoking history. He has never used smokeless tobacco. He reports current alcohol use of about 2.0 standard drinks of alcohol per week. He reports that he does not use drugs.  ADVANCE DIRECTIVES:    CODE STATUS:   PAST MEDICAL HISTORY: Past Medical History:  Diagnosis Date   At risk for sleep apnea    STOP-BANG= 4       SENT TO PCP 05-16-2014   bladder ca dx'd 05/17/14   met dz 12/2017   Bladder disorder    Hematuria    History of renal cell carcinoma    2003  S/P  PARTIAL RIGHT NEPHRECTOMY   Presence of surgical incision    lumbar diskectomy 05-11-2014-  bandage present   Urgency of urination     ALLERGIES:  is allergic to dilaudid [hydromorphone].  MEDICATIONS:  Current Outpatient Medications  Medication Sig Dispense Refill   Clotrimazole 1 % LOTN Apply 1 Application topically 2 (two) times daily as needed. 30 mL 0   diphenoxylate-atropine (LOMOTIL) 2.5-0.025 MG tablet 1 to 2 PO QID prn diarrhea 60 tablet 3   furosemide (LASIX) 20 MG tablet Take 2 tablets (40 mg total) by mouth daily. (Patient not taking: Reported on 04/24/2022) 90 tablet 1   loperamide  (IMODIUM) 2 MG capsule Take 1 capsule (2 mg total) by mouth as needed for diarrhea or loose stools. 60 capsule 1   megestrol (MEGACE) 20 MG tablet Take 1 tablet (20 mg total) by mouth 2 (two) times daily. 30 tablet 0   morphine (MS CONTIN) 30 MG 12 hr tablet Take 1 tablet (30 mg total) by mouth every 12 (twelve) hours. 60 tablet 0   nystatin (MYCOSTATIN/NYSTOP) powder Apply 1 Application topically 3 (three) times daily. 30 g 0   ondansetron (ZOFRAN) 8 MG tablet Take 1 tablet (8 mg total) by mouth every 8 (eight) hours as needed for nausea or vomiting. 30 tablet 0   oxyCODONE-acetaminophen (PERCOCET) 10-325 MG tablet Take 1 tablet by mouth every 4 (four) hours as needed for pain. 90 tablet 0   No current facility-administered medications for this visit.    VITAL SIGNS: There were no vitals taken for this visit. There were no vitals filed for this visit.  Estimated body mass index is 20.86 kg/m as calculated from the following:   Height as of 10/31/22: 6\' 2"  (1.88 m).   Weight as of 10/31/22: 162 lb 8 oz (73.7 kg).   PERFORMANCE STATUS (ECOG) : 1 - Symptomatic but completely ambulatory  Assessment NAD RRR Normal breathing pattern AAO x4  IMPRESSION: Danny Wood presents to clinic for symptom management follow-up. No acute distress noted. States he is trying to be as active as possible however this is limited  due to pain and fatigue. Denies nausea, vomiting, constipation.   Neoplasm related pain (abdomen) Danny Wood reports ongoing pain. He feels some improvement with medication however some days are better than others. When he is taking Percocet pain tends to increase around 4-5 hours. He does not feel as though regimen is as effective as previously was.   He mentions a friend who is getting treated here for her cancer and is on oxycodone 30mg  sharing since taking medications at this dose her pain is well controlled and quality of life is better. I advised each person is different and  he is also currently taking oxycodone 10/325 mg every 4 hours.  Given his current dose consideration for increasing to oxycodone 30 mg is not realistic or obtainable at this time.  Education provided on use of medication based on patient's individual pain level and need.   We discussed at length his current pain regimen which consist of MS Contin 30 mg every 12 hours and Percocet every 4 hours as needed for breakthrough pain.  He was initially not requiring Percocet around-the-clock however this has changed over the past week.  He was previously placed on hydromorphone however he felt that his Percocet worked better and I agreed to restart at that time.    Danny Wood understands we will continue to closely monitor and make adjustments to medications as needed.     Diarrhea Controlled  Decreased appetite/weight loss  Reports his appetite fluctuates.  Some days are better than others.  He is drinking homemade shakes/smoothies daily.  We discussed ways to increase his protein intake.  Advised to add protein powder to his homemade smoothies for additional support.  His current weight is 159 pounds down from 162 pounds on 2/29.  Goals of care 2/15: We discussed Her current illness and what it means in the larger context of Her on-going co-morbidities. Natural disease trajectory and expectations were discussed. Danny Wood is realistic in his understanding of his cancer progression and trajectory. Most important to him at this time is to continue to treat the treatable allowing him every opportunity to continue to thrive as well as aggressively managing his symptoms to improve his quality of life.   I discussed the importance of continued conversation with family and their medical providers regarding overall plan of care and treatment options, ensuring decisions are within the context of the patients values and GOCs.  PLAN:  Lomotil 2 tablets 4 times daily as needed Imodium as needed MS  Contin 30 mg every 12 hours.  Tolerating well and pain improved.  Hydromorphone discontinued. Patient preferred to restart on Percocet.  Oxycodone 15 mg every 4-6 hours as needed for breakthrough pain Education provided on nutrition and importance of hydration. Pain contract completed Ondansetron 8 mg every 6 hours as needed for nausea Megace 20 mg twice daily  Ongoing goals of care and symptom management as needed I will plan to see patient back in 3-4 weeks in collaboration to other oncology appointments.  Patient knows to contact our office if needed sooner.   Patient expressed understanding and was in agreement with this plan. He also understands that He can call the clinic at any time with any questions, concerns, or complaints.   Any controlled substances utilized were prescribed in the context of palliative care. PDMP has been reviewed.    Visit consisted of counseling and education dealing with the complex and emotionally intense issues of symptom management and palliative care in the setting of serious and  potentially life-threatening illness.Greater than 50%  of this time was spent counseling and coordinating care related to the above assessment and plan.  Alda Lea, AGPCNP-BC  Palliative Medicine Team/[facility]  *Please note that this is a verbal dictation therefore any spelling or grammatical errors are due to the "[employer]" system interpretation.

## 2022-12-04 NOTE — Progress Notes (Unsigned)
[place] [phone]) [phone]   Fax:(336) [phone]  PROGRESS NOTE  Patient Care Team: [name], MD as PCP - General (Family Medicine)  Hematological/Oncological History # Stage IV High Grade Urothelial Carcinoma 05/17/2014: patient underwent TURBT  06/17/2014: Neoadjuvant systemic chemotherapy in the form of gemcitabine and cisplatin  09/09/2014: robotic cystoprostatectomy and bilateral lymphadenectomy. The final pathology revealed T2N0 without any lymph node involvement.  02/17/2018: Carboplatin and gemcitabine.  He completed 8 cycles of therapy in December 2019.  04/01/2019:  Pembrolizumab 200 mg every 3 weeks. Therapy stopped because of of patient preference, excellent clinical response and treatment holiday.  He developed progression of disease in June 2021.  02/11/2020: Enfortumab vedotin. He has been receiving monthly maintenance therapy up till March 2023.  He developed disease progression at that time.  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 Therapy discontinued in July 2023 for progression of disease.  08/2022: Palliative radiation therapy to right iliac and periaortic lymph node.  He completed 10 fractions for 30 Gy  03/12/2022: Sacituzumab govitecan continued x 9 cycles. D/c due to progression 09/13/2022: last visit with Dr. Alen Blew.  10/03/2022: Transition care to Dr. Lorenso Courier. Cycle 1 Day 1 of Taxotere at 60 mg/m every 3 weeks  10/24/2022: Cycle 2 Day 1 of Taxotere at 60 mg/m  11/14/2022: Cycle 3 Day 1 of Taxotere at 60 mg/m  12/04/2022: Cycle 4 Day 1 of Taxotere at 60 mg/m HELD due to patient request.   Interval History:  Danny Wood 72 y.o. male with medical history significant for static urothelial cancer who presents for a follow up visit. The patient's last visit was on 11/14/2022. In the interim since the last visit he has continued on Taxotere.  On exam today Danny Wood  reports he is feeling worn and wiped out and would like a chemo holiday.  He reports that he would like to delay chemotherapy today and skip the cycle and consider restarting at his next cycle on 12/26/2022.  He reports that he has been doing his best to try to stay hydrated and is always "sipping on a glass of water".  His appetite remains poor as he was having difficulty swallowing.  Softer foods tend to do better such as scrambled eggs.  He also reports his wife recently made an prime rib and he was "slipping the juice".  He notes that he feels like his right leg is quite tense and painful and occasionally gives out.  He has been overall weak and unsteady.  He continues taking Tums to help build up his calcium levels and continues taking approximately 5 oxycodone a day in addition to his long-acting morphine.  A full 10 point ROS was otherwise negative.  Per the patient's request we will hold Taxotere therapy today.  MEDICAL HISTORY:  Past Medical History:  Diagnosis Date   At risk for sleep apnea    STOP-BANG= 4       SENT TO PCP 05-16-2014   bladder ca dx'd 05/17/14   met dz 12/2017   Bladder disorder    Hematuria    History of renal cell carcinoma    2003  S/P  PARTIAL RIGHT NEPHRECTOMY   Presence of surgical incision    lumbar diskectomy 05-11-2014-  bandage present   Urgency of urination     SURGICAL HISTORY: Past Surgical History:  Procedure Laterality Date   CYSTOSCOPY N/A 09/09/2014   Procedure: CYSTOSCOPY WITH INDOCYANINE  GREEN DYE INJECTION AND URETHRAL DILATION;  Surgeon: Alexis Frock, MD;  Location: [facility];  Service: Urology;  Laterality: N/A;   CYSTOSCOPY WITH BIOPSY N/A 05/17/2014   Procedure: CYSTO WITH BLADDER BIOPSY;  Surgeon: Malka So, MD;  Location: [facility];  Service: Urology;  Laterality: N/A;   EVALUATION UNDER ANESTHESIA WITH HEMORRHOIDECTOMY  07/07/2012   Procedure: EXAM UNDER ANESTHESIA WITH HEMORRHOIDECTOMY;  Surgeon: Joyice Faster. Cornett, MD;   Location: [facility];  Service: General;  Laterality: N/A;   IR IMAGING GUIDED PORT INSERTION  02/20/2018   LUMBAR MICRODISCECTOMY  05-11-2014   L4 -- L5 (partial)   PARTIAL NEPHRECTOMY Right 2003   ROBOT ASSISTED LAPAROSCOPIC COMPLETE CYSTECT ILEAL CONDUIT N/A 09/09/2014   Procedure: ROBOTIC ASSISTED LAPAROSCOPIC COMPLETE CYSTOPROSTATECTOMY,  ILEAL CONDUIT;  Surgeon: Alexis Frock, MD;  Location: [facility];  Service: Urology;  Laterality: N/A;    SOCIAL HISTORY: Social History   Socioeconomic History   Marital status: Married    Spouse name: Not on file   Number of children: Not on file   Years of education: Not on file   Highest education level: Not on file  Occupational History   Not on file  Tobacco Use   Smoking status: Former    Packs/day: 0.50    Years: 25.00    Additional pack years: 0.00    Total pack years: 12.50    Types: Cigarettes    Quit date: 06/02/2004    Years since quitting: 18.5   Smokeless tobacco: Never  Substance and Sexual Activity   Alcohol use: Yes    Alcohol/week: 2.0 standard drinks of alcohol    Types: 2 Standard drinks or equivalent per week   Drug use: No   Sexual activity: Not on file  Other Topics Concern   Not on file  Social History Narrative   Not on file   Social Determinants of Health   Financial Resource Strain: Not on file  Food Insecurity: No Food Insecurity (05/08/2022)   Hunger Vital Sign    Worried About Running Out of Food in the Last Year: Never true    Ran Out of Food in the Last Year: Never true  Transportation Needs: Not on file  Physical Activity: Not on file  Stress: Not on file  Social Connections: Not on file  Intimate Partner Violence: Not on file    FAMILY HISTORY: Family History  Problem Relation Age of Onset   Cancer Father        unk type   Colon cancer Maternal Uncle    Diabetes Neg Hx     ALLERGIES:  is allergic to dilaudid [hydromorphone].  MEDICATIONS:  Current Outpatient Medications  Medication Sig  Dispense Refill   Clotrimazole 1 % LOTN Apply 1 Application topically 2 (two) times daily as needed. 30 mL 0   diphenoxylate-atropine (LOMOTIL) 2.5-0.025 MG tablet 1 to 2 PO QID prn diarrhea 60 tablet 3   furosemide (LASIX) 20 MG tablet Take 2 tablets (40 mg total) by mouth daily. (Patient not taking: Reported on 04/24/2022) 90 tablet 1   loperamide (IMODIUM) 2 MG capsule Take 1 capsule (2 mg total) by mouth as needed for diarrhea or loose stools. 60 capsule 1   megestrol (MEGACE) 20 MG tablet Take 1 tablet (20 mg total) by mouth 2 (two) times daily. 30 tablet 0   morphine (MS CONTIN) 30 MG 12 hr tablet Take 1 tablet (30 mg total) by mouth every 12 (twelve) hours. 60 tablet 0  nystatin (MYCOSTATIN/NYSTOP) powder Apply 1 Application topically 3 (three) times daily. 30 g 0   ondansetron (ZOFRAN) 8 MG tablet Take 1 tablet (8 mg total) by mouth every 8 (eight) hours as needed for nausea or vomiting. 30 tablet 0   [START ON 12/06/2022] oxyCODONE (ROXICODONE) 15 MG immediate release tablet Take 1 tablet (15 mg total) by mouth every 6 (six) hours as needed for pain. 60 tablet 0   No current facility-administered medications for this visit.    REVIEW OF SYSTEMS:   Constitutional: ( - ) fevers, ( - )  chills , ( - ) night sweats Eyes: ( - ) blurriness of vision, ( - ) double vision, ( - ) watery eyes Ears, nose, mouth, throat, and face: ( - ) mucositis, ( - ) sore throat Respiratory: ( - ) cough, ( - ) dyspnea, ( - ) wheezes Cardiovascular: ( - ) palpitation, ( - ) chest discomfort, ( - ) lower extremity swelling Gastrointestinal:  ( - ) nausea, ( - ) heartburn, ( - ) change in bowel habits Skin: ( - ) abnormal skin rashes Lymphatics: ( - ) new lymphadenopathy, ( - ) easy bruising Neurological: ( - ) numbness, ( - ) tingling, ( - ) new weaknesses Behavioral/Psych: ( - ) mood change, ( - ) new changes  All other systems were reviewed with the patient and are negative.  PHYSICAL EXAMINATION: ECOG  PERFORMANCE STATUS: 1 - Symptomatic but completely ambulatory  Vitals:   12/05/22 1021  BP: 104/64  Pulse: 78  Resp: 15  Temp: 98.5 F (36.9 C)  SpO2: 99%    Filed Weights   12/05/22 1021  Weight: 159 lb 11.2 oz (72.4 kg)     GENERAL: Well-appearing elderly Caucasian male, alert, no distress and comfortable SKIN: skin color, texture, turgor are normal, no rashes or significant lesions EYES: conjunctiva are pink and non-injected, sclera clear LUNGS: clear to auscultation and percussion with normal breathing effort HEART: regular rate & rhythm and no murmurs and no lower extremity edema Musculoskeletal: no cyanosis of digits and no clubbing  PSYCH: alert & oriented x 3, fluent speech NEURO: no focal motor/sensory deficits  LABORATORY DATA:  I have reviewed the data as listed    Latest Ref Rng & Units 12/05/2022    9:58 AM 11/14/2022    9:09 AM 10/31/2022   10:49 AM  CBC  WBC 4.0 - 10.5 K/uL 10.6  11.3  0.8   Hemoglobin 13.0 - 17.0 g/dL 9.9  9.9  9.3   Hematocrit 39.0 - 52.0 % 29.4  29.8  26.4   Platelets 150 - 400 K/uL 301  324  170        Latest Ref Rng & Units 12/05/2022    9:58 AM 11/14/2022    9:09 AM 10/31/2022   10:49 AM  CMP  Glucose 70 - 99 mg/dL 166  140  142   BUN 8 - 23 mg/dL 21  22  26    Creatinine 0.61 - 1.24 mg/dL 0.84  0.94  0.79   Sodium 135 - 145 mmol/L 136  138  133   Potassium 3.5 - 5.1 mmol/L 4.1  4.7  4.3   Chloride 98 - 111 mmol/L 103  106  98   CO2 22 - 32 mmol/L 26  27  30    Calcium 8.9 - 10.3 mg/dL 8.2  7.4  8.0   Total Protein 6.5 - 8.1 g/dL 5.7  5.6  5.1   Total Bilirubin  0.3 - 1.2 mg/dL 0.3  0.2  0.4   Alkaline Phos 38 - 126 U/L 74  104  46   AST 15 - 41 U/L 18  18  20    ALT 0 - 44 U/L 17  18  20      RADIOGRAPHIC STUDIES: No results found.  ASSESSMENT & PLAN Danny Wood 72 y.o. male with medical history significant for static urothelial cancer who presents for a follow up visit.   # Stage IV Urothelial Cancer -- Patient  has extensively pretreated disease as noted above.  --Today he presents for cycle 4 day 1 of Taxotere, though he asked to HOLD chemotherapy due to patient request for chemo holiday. --last CT scan in Jan 2024 showed progression of disease. CT scan currently due to gauge response on Taxotere.  -- Labs today show white blood cell count 10.6, hemoglobin 9.9, MCV 89.6, and platelets of 301.  Creatinine and LFTs within normal limits. -- Plan to return to clinic in 3 weeks time for cycle 4 of chemotherapy.  # Hypomagnesemia # Hypocalcemia -- Calcium today was 8.2.  Patient is taking Tums p.o. to help bolster these levels. -- Magnesium is 1.0, recommend continued p.o. supplementation of his magnesium levels.  # Pain Control -- Patient is currently following with palliative care. -- Continue oxycodone 15 mg IR for as needed pain control. -- Long-acting pain medication with MS Contin 30 mg every 12 hours  #Supportive Care -- chemotherapy education complete -- port placed -- zofran 8mg  q8H PRN and compazine 10mg  PO q6H for nausea -- EMLA cream for port   Orders Placed This Encounter  Procedures   CT CHEST ABDOMEN PELVIS W CONTRAST    Standing Status:   Future    Standing Expiration Date:   12/05/2023    Order Specific Question:   If indicated for the ordered procedure, I authorize the administration of contrast media per Radiology protocol    Answer:   Yes    Order Specific Question:   Does the patient have a contrast media/X-ray dye allergy?    Answer:   No    Order Specific Question:   Preferred imaging location?    Answer:   [facility]    Order Specific Question:   Is Oral Contrast requested for this exam?    Answer:   Yes, Per Radiology protocol    All questions were answered. The patient knows to call the clinic with any problems, questions or concerns.  A total of more than 30 minutes were spent on this encounter with face-to-face time and non-face-to-face time, including  preparing to see the patient, ordering tests and/or medications, counseling the patient and coordination of care as outlined above.   Ledell Peoples, MD Department of Hematology/Oncology [place] [facility] Phone: [phone] Pager: [phone] Email: [email or web address]  12/05/2022 3:30 PM

## 2022-12-05 ENCOUNTER — Inpatient Hospital Stay (HOSPITAL_BASED_OUTPATIENT_CLINIC_OR_DEPARTMENT_OTHER): Payer: Medicare Other | Admitting: Nurse Practitioner

## 2022-12-05 ENCOUNTER — Encounter: Payer: Self-pay | Admitting: Nurse Practitioner

## 2022-12-05 ENCOUNTER — Inpatient Hospital Stay: Payer: Medicare Other | Attending: Oncology | Admitting: Hematology and Oncology

## 2022-12-05 ENCOUNTER — Inpatient Hospital Stay: Payer: Medicare Other

## 2022-12-05 VITALS — BP 104/64 | HR 78 | Temp 98.5°F | Resp 15 | Wt 159.7 lb

## 2022-12-05 DIAGNOSIS — Z5111 Encounter for antineoplastic chemotherapy: Secondary | ICD-10-CM | POA: Diagnosis present

## 2022-12-05 DIAGNOSIS — Z885 Allergy status to narcotic agent status: Secondary | ICD-10-CM | POA: Insufficient documentation

## 2022-12-05 DIAGNOSIS — Z515 Encounter for palliative care: Secondary | ICD-10-CM | POA: Diagnosis not present

## 2022-12-05 DIAGNOSIS — C61 Malignant neoplasm of prostate: Secondary | ICD-10-CM

## 2022-12-05 DIAGNOSIS — G893 Neoplasm related pain (acute) (chronic): Secondary | ICD-10-CM | POA: Diagnosis not present

## 2022-12-05 DIAGNOSIS — R530 Neoplastic (malignant) related fatigue: Secondary | ICD-10-CM

## 2022-12-05 DIAGNOSIS — Z809 Family history of malignant neoplasm, unspecified: Secondary | ICD-10-CM | POA: Diagnosis not present

## 2022-12-05 DIAGNOSIS — Z87891 Personal history of nicotine dependence: Secondary | ICD-10-CM | POA: Diagnosis not present

## 2022-12-05 DIAGNOSIS — Z5189 Encounter for other specified aftercare: Secondary | ICD-10-CM | POA: Diagnosis not present

## 2022-12-05 DIAGNOSIS — R531 Weakness: Secondary | ICD-10-CM | POA: Diagnosis not present

## 2022-12-05 DIAGNOSIS — Z9079 Acquired absence of other genital organ(s): Secondary | ICD-10-CM | POA: Insufficient documentation

## 2022-12-05 DIAGNOSIS — Z79899 Other long term (current) drug therapy: Secondary | ICD-10-CM | POA: Diagnosis not present

## 2022-12-05 DIAGNOSIS — Z95828 Presence of other vascular implants and grafts: Secondary | ICD-10-CM

## 2022-12-05 DIAGNOSIS — R42 Dizziness and giddiness: Secondary | ICD-10-CM | POA: Insufficient documentation

## 2022-12-05 DIAGNOSIS — Z79633 Long term (current) use of mitotic inhibitor: Secondary | ICD-10-CM | POA: Diagnosis not present

## 2022-12-05 DIAGNOSIS — C679 Malignant neoplasm of bladder, unspecified: Secondary | ICD-10-CM | POA: Insufficient documentation

## 2022-12-05 DIAGNOSIS — Z800 Family history of malignant neoplasm of digestive organs: Secondary | ICD-10-CM | POA: Insufficient documentation

## 2022-12-05 DIAGNOSIS — M7989 Other specified soft tissue disorders: Secondary | ICD-10-CM | POA: Diagnosis not present

## 2022-12-05 DIAGNOSIS — R590 Localized enlarged lymph nodes: Secondary | ICD-10-CM | POA: Insufficient documentation

## 2022-12-05 DIAGNOSIS — M79604 Pain in right leg: Secondary | ICD-10-CM | POA: Diagnosis not present

## 2022-12-05 DIAGNOSIS — C670 Malignant neoplasm of trigone of bladder: Secondary | ICD-10-CM | POA: Diagnosis not present

## 2022-12-05 DIAGNOSIS — R197 Diarrhea, unspecified: Secondary | ICD-10-CM | POA: Diagnosis not present

## 2022-12-05 DIAGNOSIS — Z85528 Personal history of other malignant neoplasm of kidney: Secondary | ICD-10-CM | POA: Insufficient documentation

## 2022-12-05 DIAGNOSIS — C772 Secondary and unspecified malignant neoplasm of intra-abdominal lymph nodes: Secondary | ICD-10-CM

## 2022-12-05 LAB — CMP (CANCER CENTER ONLY)
ALT: 17 U/L (ref 0–44)
AST: 18 U/L (ref 15–41)
Albumin: 3.1 g/dL — ABNORMAL LOW (ref 3.5–5.0)
Alkaline Phosphatase: 74 U/L (ref 38–126)
Anion gap: 7 (ref 5–15)
BUN: 21 mg/dL (ref 8–23)
CO2: 26 mmol/L (ref 22–32)
Calcium: 8.2 mg/dL — ABNORMAL LOW (ref 8.9–10.3)
Chloride: 103 mmol/L (ref 98–111)
Creatinine: 0.84 mg/dL (ref 0.61–1.24)
GFR, Estimated: >60 mL/min (ref >60)
Glucose, Bld: 166 mg/dL — ABNORMAL HIGH (ref 70–99)
Potassium: 4.1 mmol/L (ref 3.5–5.1)
Sodium: 136 mmol/L (ref 135–145)
Total Bilirubin: 0.3 mg/dL (ref 0.3–1.2)
Total Protein: 5.7 g/dL — ABNORMAL LOW (ref 6.5–8.1)

## 2022-12-05 LAB — CBC WITH DIFFERENTIAL (CANCER CENTER ONLY)
Abs Immature Granulocytes: 0.05 10*3/uL (ref 0.00–0.07)
Basophils Absolute: 0.1 10*3/uL (ref 0.0–0.1)
Basophils Relative: 1 %
Eosinophils Absolute: 0 10*3/uL (ref 0.0–0.5)
Eosinophils Relative: 0 %
HCT: 29.4 % — ABNORMAL LOW (ref 39.0–52.0)
Hemoglobin: 9.9 g/dL — ABNORMAL LOW (ref 13.0–17.0)
Immature Granulocytes: 1 %
Lymphocytes Relative: 5 %
Lymphs Abs: 0.6 10*3/uL — ABNORMAL LOW (ref 0.7–4.0)
MCH: 30.2 pg (ref 26.0–34.0)
MCHC: 33.7 g/dL (ref 30.0–36.0)
MCV: 89.6 fL (ref 80.0–100.0)
Monocytes Absolute: 0.7 10*3/uL (ref 0.1–1.0)
Monocytes Relative: 7 %
Neutro Abs: 9.2 10*3/uL — ABNORMAL HIGH (ref 1.7–7.7)
Neutrophils Relative %: 86 %
Platelet Count: 301 10*3/uL (ref 150–400)
RBC: 3.28 MIL/uL — ABNORMAL LOW (ref 4.22–5.81)
RDW: 15.5 % (ref 11.5–15.5)
WBC Count: 10.6 10*3/uL — ABNORMAL HIGH (ref 4.0–10.5)
nRBC: 0 % (ref 0.0–0.2)

## 2022-12-05 LAB — MAGNESIUM: Magnesium: 1 mg/dL — ABNORMAL LOW (ref 1.7–2.4)

## 2022-12-05 LAB — PHOSPHORUS: Phosphorus: 3.8 mg/dL (ref 2.5–4.6)

## 2022-12-05 MED ORDER — SODIUM CHLORIDE 0.9% FLUSH
10.0000 mL | Freq: Once | INTRAVENOUS | Status: AC
  Administered 2022-12-05: 10 mL

## 2022-12-05 MED ORDER — OXYCODONE HCL 15 MG PO TABS: 15.0000 mg | ORAL_TABLET | Freq: Four times a day (QID) | ORAL | 0 refills | Status: DC | PRN

## 2022-12-07 ENCOUNTER — Inpatient Hospital Stay: Payer: Medicare Other

## 2022-12-23 ENCOUNTER — Other Ambulatory Visit: Payer: Self-pay | Admitting: Nurse Practitioner

## 2022-12-23 ENCOUNTER — Telehealth: Payer: Self-pay | Admitting: *Deleted

## 2022-12-23 DIAGNOSIS — G893 Neoplasm related pain (acute) (chronic): Secondary | ICD-10-CM

## 2022-12-23 DIAGNOSIS — Z515 Encounter for palliative care: Secondary | ICD-10-CM

## 2022-12-23 DIAGNOSIS — C679 Malignant neoplasm of bladder, unspecified: Secondary | ICD-10-CM

## 2022-12-23 MED ORDER — OXYCODONE HCL 15 MG PO TABS
15.0000 mg | ORAL_TABLET | Freq: Four times a day (QID) | ORAL | 0 refills | Status: DC | PRN
Start: 2022-12-23 — End: 2023-01-07

## 2022-12-23 MED ORDER — MORPHINE SULFATE ER 30 MG PO TBCR
30.0000 mg | EXTENDED_RELEASE_TABLET | Freq: Two times a day (BID) | ORAL | 0 refills | Status: DC
Start: 2022-12-23 — End: 2023-01-30

## 2022-12-23 NOTE — Telephone Encounter (Signed)
Pt called to get refills on his pain meds.

## 2022-12-25 MED FILL — Dexamethasone Sodium Phosphate Inj 100 MG/10ML: INTRAMUSCULAR | Qty: 1 | Status: AC

## 2022-12-25 NOTE — Progress Notes (Unsigned)
Palliative Medicine [facility]  [phone] [phone]   Name: Danny Wood Date: 12/25/2022 MRN: [id number]  DOB: May 29, 1951  Patient Care Team: Tally Joe, MD as PCP - General (Family Medicine)   INTERVAL HISTORY: Danny Wood is a 72 y.o. male with oncologic medical history including bladder cancer diagnosed in 2015.  He developed stage IV high-grade urothelial carcinoma with pelvic adenopathy in 2019 despite chemotherapy treatments. Recent treatment change in July 2023 due to further progression with chemoradiation. Actively being treated with Sacituzumab.  Palliative ask to see for symptom management and goals of care.   SOCIAL HISTORY:    Danny Wood reports that he quit smoking about 18 years ago. His smoking use included cigarettes. He has a 12.50 pack-year smoking history. He has never used smokeless tobacco. He reports current alcohol use of about 2.0 standard drinks of alcohol per week. He reports that he does not use drugs.  ADVANCE DIRECTIVES: none on file   CODE STATUS: FULL CODE  PAST MEDICAL HISTORY: Past Medical History:  Diagnosis Date   At risk for sleep apnea    STOP-BANG= 4       SENT TO PCP 05-16-2014   bladder ca dx'd 05/17/14   met dz 12/2017   Bladder disorder    Hematuria    History of renal cell carcinoma    2003  S/P  PARTIAL RIGHT NEPHRECTOMY   Presence of surgical incision    lumbar diskectomy 05-11-2014-  bandage present   Urgency of urination     ALLERGIES:  is allergic to dilaudid [hydromorphone].  MEDICATIONS:  Current Outpatient Medications  Medication Sig Dispense Refill   Clotrimazole 1 % LOTN Apply 1 Application topically 2 (two) times daily as needed. 30 mL 0   diphenoxylate-atropine (LOMOTIL) 2.5-0.025 MG tablet 1 to 2 PO QID prn diarrhea 60 tablet 3   furosemide (LASIX) 20 MG tablet Take 2 tablets (40 mg total) by mouth daily. (Patient not taking: Reported on  04/24/2022) 90 tablet 1   loperamide (IMODIUM) 2 MG capsule Take 1 capsule (2 mg total) by mouth as needed for diarrhea or loose stools. 60 capsule 1   magnesium oxide (MAG-OX) 400 (240 Mg) MG tablet Take 1 tablet (400 mg total) by mouth daily. 90 tablet 1   megestrol (MEGACE) 20 MG tablet Take 1 tablet (20 mg total) by mouth 2 (two) times daily. 30 tablet 0   morphine (MS CONTIN) 30 MG 12 hr tablet Take 1 tablet (30 mg total) by mouth every 12 (twelve) hours. 60 tablet 0   nystatin (MYCOSTATIN/NYSTOP) powder Apply 1 Application topically 3 (three) times daily. 30 g 0   ondansetron (ZOFRAN) 8 MG tablet Take 1 tablet (8 mg total) by mouth every 8 (eight) hours as needed for nausea or vomiting. 30 tablet 0   oxyCODONE (ROXICODONE) 15 MG immediate release tablet Take 1 tablet (15 mg total) by mouth every 6 (six) hours as needed for pain. 60 tablet 0   No current facility-administered medications for this visit.   Facility-Administered Medications Ordered in Other Visits  Medication Dose Route Frequency Provider Last Rate Last Admin   heparin lock flush 100 unit/mL  500 Units Intracatheter Once PRN Ulysees Barns IV, MD       magnesium sulfate IVPB 4 g 100 mL  4 g Intravenous Once Ulysees Barns IV, MD 50 mL/hr at 12/26/22 1318 4 g at 12/26/22 1318   sodium chloride flush (NS)  0.9 % injection 10 mL  10 mL Intracatheter PRN Jaci Standard, MD        VITAL SIGNS: There were no vitals taken for this visit. There were no vitals filed for this visit.  Estimated body mass index is 20.5 kg/m as calculated from the following:   Height as of 10/31/22: 6\' 2"  (1.88 m).   Weight as of 12/05/22: 72.4 kg.   PERFORMANCE STATUS (ECOG) : 1 - Symptomatic but completely ambulatory  Physical Exam General: NAD Cardiovascular: regular rate and rhythm Pulmonary: clear ant fields Abdomen: soft, nontender, + bowel sounds Extremities: no joint deformities, right leg (mid-thigh to foot) swollen > left Skin: no  rashes Neurological: alert and oriented x 4  IMPRESSION: Danny Wood presents to this visit alone. No acute distress noted. Trying to remain as active as possible however speaks to some limitations due to fatigue and pain. Taking things one day at a time. Denies nausea, vomiting, or constipation.    Neoplasm related pain (abdomen) Danny Wood endorses ongoing pain. The worst pain is to the right leg and he states sometimes he "feels like it will bust open." Significant swelling noted to right leg as compared with left. Reports that he has tried to elevate the extremity but ends up in an awkward position trying to do so. Has experienced recent cramping to calves, and while transient, is very painful and occurs sporadically. Reports episodes last 10-15 seconds and then resolves. He reports having found one comfortable, "sanctuary spot." This is lying on his left side with his right leg lifted and crossed over the left.  Patient is taking MS Contin 30 mg BID. Oxycodone 15 mg available every 4-6 hours as needed for breakthrough pain. He is taking the Oxycodone 3-4 times per day. Feels that it is doing little to help pain despite recent increase. Danny Wood speaks of a friend who had cancer and did better while taking Dilaudid. Education provided on use of medication based on patient's situation and need. We attempted dilaudid in the past however patient did not feel it warranted any improvement in his symptoms only increased his drowsiness and requested to discontinue use returning to oxycodone.   Danny Wood is offered opioid rotation by [facility] NP and declines medication changes at this time. He understands we will continue to closely monitor and make adjustments to medications as needed.      Diarrhea/constipation Reports that diarrhea is controlled. Typically spends about one hour in the bathroom. Has one stool and, afterwards, does not feel that he has fully evacuated bowels. Remains  on toilet over the course of an hour until additional stool is expelled. Describes stools as soft and, at times, loose. Reports scheduling shower time around toileting for both comfort and cleanliness.   Decreased appetite/weight loss  Appetite varies from day to day and Danny Wood reports eating about half of what he normally would at a meal. He has begun incorporating Naturade Weight Gain protein powder into his homemade smoothies with good result. Weight has increased from 72.4 kg (4/4) to 74.3 kg (today). Danny Wood is commended for his effort to increase is calories and encouraged to continue the good work.   We discussed the importance of continued conversation with family and their medical providers regarding overall plan of care and treatment options, ensuring decisions are within the context of the patients values and GOCs.  PLAN:  No changes to current pain medication regimen. MS Contin 30 mg every 12 hours.  Oxycodone 15 mg every 4-6 hours as needed for breakthrough pain. Continue protein supplement drinks as able. Has experienced good weight gain. Ongoing goals of care and symptom management as needed. I will plan to see patient back in 3-4 weeks in collaboration to other oncology appointments. Patient knows to contact our office if needed sooner.   Patient expressed understanding and was in agreement with this plan. He also understands that he can call the clinic at any time with any questions, concerns, or complaints.   Any controlled substances utilized were prescribed in the context of palliative care. PDMP has been reviewed.    Visit consisted of counseling and education dealing with the complex and emotionally intense issues of symptom management and palliative care in the setting of serious and potentially life-threatening illness.Greater than 50%  of this time was spent counseling and coordinating care related to the above assessment and plan.  Signed  by: Katy Apo, RN MSN [facility] / NP Student   Willette Alma, AGPCNP-BC  Palliative Medicine Team/[facility]  *Please note that this is a verbal dictation therefore any spelling or grammatical errors are due to the "Dragon [employer] One" system interpretation.

## 2022-12-26 ENCOUNTER — Inpatient Hospital Stay: Payer: Medicare Other

## 2022-12-26 ENCOUNTER — Inpatient Hospital Stay: Payer: Medicare Other | Admitting: Dietician

## 2022-12-26 ENCOUNTER — Other Ambulatory Visit: Payer: Self-pay

## 2022-12-26 ENCOUNTER — Encounter: Payer: Self-pay | Admitting: Nurse Practitioner

## 2022-12-26 ENCOUNTER — Inpatient Hospital Stay (HOSPITAL_BASED_OUTPATIENT_CLINIC_OR_DEPARTMENT_OTHER): Payer: Medicare Other | Admitting: Hematology and Oncology

## 2022-12-26 ENCOUNTER — Inpatient Hospital Stay (HOSPITAL_BASED_OUTPATIENT_CLINIC_OR_DEPARTMENT_OTHER): Payer: Medicare Other | Admitting: Nurse Practitioner

## 2022-12-26 ENCOUNTER — Other Ambulatory Visit: Payer: Self-pay | Admitting: *Deleted

## 2022-12-26 VITALS — BP 114/58 | HR 64 | Temp 98.0°F | Resp 16

## 2022-12-26 VITALS — BP 103/56 | HR 66 | Temp 97.2°F | Resp 14 | Wt 163.9 lb

## 2022-12-26 DIAGNOSIS — G893 Neoplasm related pain (acute) (chronic): Secondary | ICD-10-CM

## 2022-12-26 DIAGNOSIS — R530 Neoplastic (malignant) related fatigue: Secondary | ICD-10-CM

## 2022-12-26 DIAGNOSIS — C679 Malignant neoplasm of bladder, unspecified: Secondary | ICD-10-CM

## 2022-12-26 DIAGNOSIS — C772 Secondary and unspecified malignant neoplasm of intra-abdominal lymph nodes: Secondary | ICD-10-CM

## 2022-12-26 DIAGNOSIS — C670 Malignant neoplasm of trigone of bladder: Secondary | ICD-10-CM

## 2022-12-26 DIAGNOSIS — Z515 Encounter for palliative care: Secondary | ICD-10-CM | POA: Diagnosis not present

## 2022-12-26 DIAGNOSIS — Z95828 Presence of other vascular implants and grafts: Secondary | ICD-10-CM

## 2022-12-26 DIAGNOSIS — Z5111 Encounter for antineoplastic chemotherapy: Secondary | ICD-10-CM | POA: Diagnosis not present

## 2022-12-26 LAB — CBC WITH DIFFERENTIAL (CANCER CENTER ONLY)
Abs Immature Granulocytes: 0.01 10*3/uL (ref 0.00–0.07)
Basophils Absolute: 0 10*3/uL (ref 0.0–0.1)
Basophils Relative: 1 %
Eosinophils Absolute: 0.1 10*3/uL (ref 0.0–0.5)
Eosinophils Relative: 1 %
HCT: 31.9 % — ABNORMAL LOW (ref 39.0–52.0)
Hemoglobin: 10.7 g/dL — ABNORMAL LOW (ref 13.0–17.0)
Immature Granulocytes: 0 %
Lymphocytes Relative: 8 %
Lymphs Abs: 0.5 10*3/uL — ABNORMAL LOW (ref 0.7–4.0)
MCH: 29.9 pg (ref 26.0–34.0)
MCHC: 33.5 g/dL (ref 30.0–36.0)
MCV: 89.1 fL (ref 80.0–100.0)
Monocytes Absolute: 0.5 10*3/uL (ref 0.1–1.0)
Monocytes Relative: 7 %
Neutro Abs: 5.3 10*3/uL (ref 1.7–7.7)
Neutrophils Relative %: 83 %
Platelet Count: 231 10*3/uL (ref 150–400)
RBC: 3.58 MIL/uL — ABNORMAL LOW (ref 4.22–5.81)
RDW: 14.2 % (ref 11.5–15.5)
WBC Count: 6.5 10*3/uL (ref 4.0–10.5)
nRBC: 0 % (ref 0.0–0.2)

## 2022-12-26 LAB — CMP (CANCER CENTER ONLY)
ALT: 13 U/L (ref 0–44)
AST: 18 U/L (ref 15–41)
Albumin: 2.9 g/dL — ABNORMAL LOW (ref 3.5–5.0)
Alkaline Phosphatase: 55 U/L (ref 38–126)
Anion gap: 3 — ABNORMAL LOW (ref 5–15)
BUN: 19 mg/dL (ref 8–23)
CO2: 30 mmol/L (ref 22–32)
Calcium: 8.4 mg/dL — ABNORMAL LOW (ref 8.9–10.3)
Chloride: 103 mmol/L (ref 98–111)
Creatinine: 0.84 mg/dL (ref 0.61–1.24)
GFR, Estimated: >60 mL/min (ref >60)
Glucose, Bld: 180 mg/dL — ABNORMAL HIGH (ref 70–99)
Potassium: 4 mmol/L (ref 3.5–5.1)
Sodium: 136 mmol/L (ref 135–145)
Total Bilirubin: 0.3 mg/dL (ref 0.3–1.2)
Total Protein: 5.4 g/dL — ABNORMAL LOW (ref 6.5–8.1)

## 2022-12-26 LAB — MAGNESIUM: Magnesium: 1 mg/dL — ABNORMAL LOW (ref 1.7–2.4)

## 2022-12-26 MED ORDER — SODIUM CHLORIDE 0.9 % IV SOLN: Freq: Once | INTRAVENOUS | Status: AC

## 2022-12-26 MED ORDER — MAGNESIUM SULFATE 4 GM/100ML IV SOLN
4.0000 g | Freq: Once | INTRAVENOUS | Status: AC
  Administered 2022-12-26: 4 g via INTRAVENOUS
  Filled 2022-12-26: qty 100

## 2022-12-26 MED ORDER — SODIUM CHLORIDE 0.9 % IV SOLN
60.0000 mg/m2 | Freq: Once | INTRAVENOUS | Status: AC
  Administered 2022-12-26: 118 mg via INTRAVENOUS
  Filled 2022-12-26: qty 11.8

## 2022-12-26 MED ORDER — SODIUM CHLORIDE 0.9% FLUSH
10.0000 mL | INTRAVENOUS | Status: DC | PRN
  Administered 2022-12-26: 10 mL

## 2022-12-26 MED ORDER — HEPARIN SOD (PORK) LOCK FLUSH 100 UNIT/ML IV SOLN
500.0000 [IU] | Freq: Once | INTRAVENOUS | Status: AC | PRN
  Administered 2022-12-26: 500 [IU]

## 2022-12-26 MED ORDER — MAGNESIUM OXIDE -MG SUPPLEMENT 400 (240 MG) MG PO TABS: 400.0000 mg | ORAL_TABLET | Freq: Every day | ORAL | 1 refills | Status: DC

## 2022-12-26 MED ORDER — SODIUM CHLORIDE 0.9 % IV SOLN
10.0000 mg | Freq: Once | INTRAVENOUS | Status: AC
  Administered 2022-12-26: 10 mg via INTRAVENOUS
  Filled 2022-12-26: qty 10

## 2022-12-26 NOTE — Progress Notes (Signed)
Nutrition Follow-up:  Patient with progression of bladder cancer. He is receiving taxotere q21d. Per pt request, chemotherapy held on 4/4.   Met with patient in infusion. He reports overall doing okay. States he is tired. Patient reports appetite is about the same. He has not been taking appetite stimulant (megace). Patient forgot about this. Patient continues to frequent the chinese buffet as well as the [place] buffet for Sunday brunch. Patient reports using naturade weight gainer powder. States it "has saved his life" Patient is drinking this once a day. It is very filling.   Medications: megace, mag-ox, roxicodone, morphine, lomotil  Labs: glucose 180, albumin 2.9  Anthropometrics: Wt 163 lb 14.4 oz today increased   4/4 - 159 lb 11.2 oz  2/29 - 162 lb 8 oz   NUTRITION DIAGNOSIS: Unintended weight loss improving    INTERVENTION:  Continue strategies for increasing calories and protein with small frequent meals/snacks Continue daily protein drink  Patient will start taking appetite stimulant     MONITORING, EVALUATION, GOAL: weight trends, intake   NEXT VISIT: To be scheduled as needed

## 2022-12-26 NOTE — Patient Instructions (Addendum)
[facility]  Discharge Instructions: Thank you for choosing [facility] to provide your oncology and hematology care.   If you have a lab appointment with the Cancer Center, please go directly to the Cancer Center and check in at the registration area.   Wear comfortable clothing and clothing appropriate for easy access to any Portacath or PICC line.   We strive to give you quality time with your provider. You may need to reschedule your appointment if you arrive late (15 or more minutes).  Arriving late affects you and other patients whose appointments are after yours.  Also, if you miss three or more appointments without notifying the office, you may be dismissed from the clinic at the provider's discretion.      For prescription refill requests, have your pharmacy contact our office and allow 72 hours for refills to be completed.    Today you received the following chemotherapy and/or immunotherapy agents: Taxotere.      To help prevent nausea and vomiting after your treatment, we encourage you to take your nausea medication as directed.  BELOW ARE SYMPTOMS THAT SHOULD BE REPORTED IMMEDIATELY: *FEVER GREATER THAN 100.4 F (38 C) OR HIGHER *CHILLS OR SWEATING *NAUSEA AND VOMITING THAT IS NOT CONTROLLED WITH YOUR NAUSEA MEDICATION *UNUSUAL SHORTNESS OF BREATH *UNUSUAL BRUISING OR BLEEDING *URINARY PROBLEMS (pain or burning when urinating, or frequent urination) *BOWEL PROBLEMS (unusual diarrhea, constipation, pain near the anus) TENDERNESS IN MOUTH AND THROAT WITH OR WITHOUT PRESENCE OF ULCERS (sore throat, sores in mouth, or a toothache) UNUSUAL RASH, SWELLING OR PAIN  UNUSUAL VAGINAL DISCHARGE OR ITCHING   Items with * indicate a potential emergency and should be followed up as soon as possible or go to the Emergency Department if any problems should occur.  Please show the CHEMOTHERAPY ALERT CARD or IMMUNOTHERAPY ALERT CARD at  check-in to the Emergency Department and triage nurse.  Should you have questions after your visit or need to cancel or reschedule your appointment, please contact [facility]  Dept: [phone]  and follow the prompts.  Office hours are 8:00 a.m. to 4:30 p.m. Monday - Friday. Please note that voicemails left after 4:00 p.m. may not be returned until the following business day.  We are closed weekends and major holidays. You have access to a nurse at all times for urgent questions. Please call the main number to the clinic Dept: [phone] and follow the prompts.   For any non-urgent questions, you may also contact your provider using MyChart. We now offer e-Visits for anyone 28 and older to request care online for non-urgent symptoms. For details visit [email or web address].   Also download the MyChart app! Go to the app store, search "MyChart", open the app, select [facility], and log in with your MyChart username and password.  Hypomagnesemia Hypomagnesemia is a condition in which the level of magnesium in the blood is too low. Magnesium is a mineral that is found in many foods. It is used in many different processes in the body. Hypomagnesemia can affect every organ in the body. In severe cases, it can cause life-threatening problems. What are the causes? This condition may be caused by: Not getting enough magnesium in your diet or not having enough healthy foods to eat (malnutrition). Problems with magnesium absorption in the intestines. Dehydration. Excessive use of alcohol. Vomiting. Severe or long-term (chronic) diarrhea. Some medicines, including medicines that make you urinate more  often (diuretics). Certain diseases, such as kidney disease, diabetes, celiac disease, and overactive thyroid. What are the signs or symptoms? Symptoms of this condition include: Loss of appetite, nausea, and vomiting. Involuntary shaking or trembling of a body  part (tremor). Muscle weakness or tingling in the arms and legs. Sudden tightening of muscles (muscle spasms). Confusion. Psychiatric issues, such as: Depression and irritability. Psychosis. A feeling of fluttering of the heart (palpitations). Seizures. These symptoms are more severe if magnesium levels drop suddenly. How is this diagnosed? This condition may be diagnosed based on: Your symptoms and medical history. A physical exam. Blood and urine tests. How is this treated? Treatment depends on the cause and the severity of the condition. It may be treated by: Taking a magnesium supplement. This can be taken in pill form. If the condition is severe, magnesium is usually given through an IV. Making changes to your diet. You may be directed to eat foods that have a lot of magnesium, such as green leafy vegetables, peas, beans, and nuts. Not drinking alcohol. If you are struggling not to drink, ask your health care provider for help. Follow these instructions at home: Eating and drinking     Make sure that your diet includes foods with magnesium. Foods that have a lot of magnesium in them include: Green leafy vegetables, such as spinach and broccoli. Beans and peas. Nuts and seeds, such as almonds and sunflower seeds. Whole grains, such as whole grain bread and fortified cereals. Drink fluids that contain salts and minerals (electrolytes), such as sports drinks, when you are active. Do not drink alcohol. General instructions Take over-the-counter and prescription medicines only as told by your health care provider. Take magnesium supplements as directed if your health care provider tells you to take them. Have your magnesium levels monitored as told by your health care provider. Keep all follow-up visits. This is important. Contact a health care provider if: You get worse instead of better. Your symptoms return. Get help right away if: You develop severe muscle weakness. You  have trouble breathing. You feel that your heart is racing. These symptoms may represent a serious problem that is an emergency. Do not wait to see if the symptoms will go away. Get medical help right away. Call your local emergency services (911 in the [place] Do not drive yourself to the hospital. Summary Hypomagnesemia is a condition in which the level of magnesium in the blood is too low. Hypomagnesemia can affect every organ in the body. Treatment may include eating more foods that contain magnesium, taking magnesium supplements, and not drinking alcohol. Have your magnesium levels monitored as told by your health care provider. This information is not intended to replace advice given to you by your health care provider. Make sure you discuss any questions you have with your health care provider. Document Revised: 01/16/2021 Document Reviewed: 01/16/2021 Elsevier Patient Education  [place].

## 2022-12-26 NOTE — Progress Notes (Signed)
[facility] [phone]   [phone]  PROGRESS NOTE  Patient Care Team: Tally Joe, MD as PCP - General (Family Medicine)  Hematological/Oncological History # Stage IV High Grade Urothelial Carcinoma 05/17/2014: patient underwent TURBT  06/17/2014: Neoadjuvant systemic chemotherapy in the form of gemcitabine and cisplatin  09/09/2014: robotic cystoprostatectomy and bilateral lymphadenectomy. The final pathology revealed T2N0 without any lymph node involvement.  02/17/2018: Carboplatin and gemcitabine.  He completed 8 cycles of therapy in December 2019.  04/01/2019:  Pembrolizumab 200 mg every 3 weeks. Therapy stopped because of of patient preference, excellent clinical response and treatment holiday.  He developed progression of disease in June 2021.  02/11/2020: Enfortumab vedotin. He has been receiving monthly maintenance therapy up till March 2023.  He developed disease progression at that time.  [id number] analysis showed an ATM mutation as well as ERBB2 mutation  02/11/2020: started Olaparib 300 mg BID. Therapy discontinued in July 2023 for progression of disease.  08/2022: Palliative radiation therapy to right iliac and periaortic lymph node.  He completed 10 fractions for 30 Gy  03/12/2022: Sacituzumab govitecan continued x 9 cycles. D/c due to progression 09/13/2022: last visit with Dr. Clelia Croft.  10/03/2022: Transition care to Dr. Leonides Schanz. Cycle 1 Day 1 of Taxotere at 60 mg/m every 3 weeks  10/24/2022: Cycle 2 Day 1 of Taxotere at 60 mg/m  11/14/2022: Cycle 3 Day 1 of Taxotere at 60 mg/m  12/04/2022: Cycle 4 Day 1 of Taxotere at 60 mg/m HELD due to patient request.  12/26/2022: Cycle 4 Day 1 of Taxotere at 60 mg/m  Interval History:  Danny Wood 72 y.o. male with medical history significant for static urothelial cancer who presents for a follow up visit. The patient's last visit was on 12/05/2022. In the interim since the last visit he has held  his last cycle of Taxotere.   On exam today Danny Wood reports he is "not sure if the chemo holiday helped".  He reports his right leg is still swollen and uncomfortable.  He feels like he got "whacked with a baseball bat".  He also has issues of feeling "freezing cold".  He notes in the morning he wakes up to eat his breakfast take his pills and then goes back to bed for about 2 to 3 hours.  He has gained about 3 to 4 pounds interim since the last visit he is surprised he is gained weight because his appetite is been so poor.  He is not having any nausea, vomiting, or diarrhea.  He does not have any dizziness or shortness of breath but does have some occasional lightheadedness particular when he stands up quickly.  He notes that he is willing and able to proceed with chemotherapy treatment today.  He notes that he continues to have severe abdominal pain.  He continues to take his MS Contin 30 mg twice daily as well as oxycodone 3-4 times per day as needed.  He will be meeting with palliative care again today.  A full 10 point ROS was otherwise negative.  MEDICAL HISTORY:  Past Medical History:  Diagnosis Date   At risk for sleep apnea    STOP-BANG= 4       SENT TO PCP 05-16-2014   bladder ca dx'd 05/17/14   met dz 12/2017   Bladder disorder    Hematuria    History of renal cell carcinoma    2003  S/P  PARTIAL RIGHT NEPHRECTOMY   Presence of surgical incision  lumbar diskectomy 05-11-2014-  bandage present   Urgency of urination     SURGICAL HISTORY: Past Surgical History:  Procedure Laterality Date   CYSTOSCOPY N/A 09/09/2014   Procedure: CYSTOSCOPY WITH INDOCYANINE GREEN DYE INJECTION AND URETHRAL DILATION;  Surgeon: Sebastian Ache, MD;  Location: [facility];  Service: Urology;  Laterality: N/A;   CYSTOSCOPY WITH BIOPSY N/A 05/17/2014   Procedure: CYSTO WITH BLADDER BIOPSY;  Surgeon: Anner Crete, MD;  Location: [facility];  Service: Urology;  Laterality: N/A;   EVALUATION  UNDER ANESTHESIA WITH HEMORRHOIDECTOMY  07/07/2012   Procedure: EXAM UNDER ANESTHESIA WITH HEMORRHOIDECTOMY;  Surgeon: Clovis Pu. Cornett, MD;  Location: [facility];  Service: General;  Laterality: N/A;   IR IMAGING GUIDED PORT INSERTION  02/20/2018   LUMBAR MICRODISCECTOMY  05-11-2014   L4 -- L5 (partial)   PARTIAL NEPHRECTOMY Right 2003   ROBOT ASSISTED LAPAROSCOPIC COMPLETE CYSTECT ILEAL CONDUIT N/A 09/09/2014   Procedure: ROBOTIC ASSISTED LAPAROSCOPIC COMPLETE CYSTOPROSTATECTOMY,  ILEAL CONDUIT;  Surgeon: Sebastian Ache, MD;  Location: [facility];  Service: Urology;  Laterality: N/A;    SOCIAL HISTORY: Social History   Socioeconomic History   Marital status: Married    Spouse name: Not on file   Number of children: Not on file   Years of education: Not on file   Highest education level: Not on file  Occupational History   Not on file  Tobacco Use   Smoking status: Former    Packs/day: 0.50    Years: 25.00    Additional pack years: 0.00    Total pack years: 12.50    Types: Cigarettes    Quit date: 06/02/2004    Years since quitting: 18.5   Smokeless tobacco: Never  Substance and Sexual Activity   Alcohol use: Yes    Alcohol/week: 2.0 standard drinks of alcohol    Types: 2 Standard drinks or equivalent per week   Drug use: No   Sexual activity: Not on file  Other Topics Concern   Not on file  Social History Narrative   Not on file   Social Determinants of Health   Financial Resource Strain: Not on file  Food Insecurity: No Food Insecurity (05/08/2022)   Hunger Vital Sign    Worried About Running Out of Food in the Last Year: Never true    Ran Out of Food in the Last Year: Never true  Transportation Needs: Not on file  Physical Activity: Not on file  Stress: Not on file  Social Connections: Not on file  Intimate Partner Violence: Not on file    FAMILY HISTORY: Family History  Problem Relation Age of Onset   Cancer Father        unk type   Colon cancer Maternal Uncle     Diabetes Neg Hx     ALLERGIES:  is allergic to dilaudid [hydromorphone].  MEDICATIONS:  Current Outpatient Medications  Medication Sig Dispense Refill   magnesium oxide (MAG-OX) 400 (240 Mg) MG tablet Take 1 tablet (400 mg total) by mouth daily. 90 tablet 1   Clotrimazole 1 % LOTN Apply 1 Application topically 2 (two) times daily as needed. 30 mL 0   diphenoxylate-atropine (LOMOTIL) 2.5-0.025 MG tablet 1 to 2 PO QID prn diarrhea 60 tablet 3   furosemide (LASIX) 20 MG tablet Take 2 tablets (40 mg total) by mouth daily. (Patient not taking: Reported on 04/24/2022) 90 tablet 1   loperamide (IMODIUM) 2 MG capsule Take 1 capsule (2 mg total) by mouth  as needed for diarrhea or loose stools. 60 capsule 1   megestrol (MEGACE) 20 MG tablet Take 1 tablet (20 mg total) by mouth 2 (two) times daily. 30 tablet 0   morphine (MS CONTIN) 30 MG 12 hr tablet Take 1 tablet (30 mg total) by mouth every 12 (twelve) hours. 60 tablet 0   nystatin (MYCOSTATIN/NYSTOP) powder Apply 1 Application topically 3 (three) times daily. 30 g 0   ondansetron (ZOFRAN) 8 MG tablet Take 1 tablet (8 mg total) by mouth every 8 (eight) hours as needed for nausea or vomiting. 30 tablet 0   oxyCODONE (ROXICODONE) 15 MG immediate release tablet Take 1 tablet (15 mg total) by mouth every 6 (six) hours as needed for pain. 60 tablet 0   No current facility-administered medications for this visit.   Facility-Administered Medications Ordered in Other Visits  Medication Dose Route Frequency Provider Last Rate Last Admin   heparin lock flush 100 unit/mL  500 Units Intracatheter Once PRN Jaci Standard, MD       magnesium sulfate IVPB 4 g 100 mL  4 g Intravenous Once Ulysees Barns IV, MD 50 mL/hr at 12/26/22 1318 4 g at 12/26/22 1318   sodium chloride flush (NS) 0.9 % injection 10 mL  10 mL Intracatheter PRN Jaci Standard, MD        REVIEW OF SYSTEMS:   Constitutional: ( - ) fevers, ( - )  chills , ( - ) night sweats Eyes: ( - )  blurriness of vision, ( - ) double vision, ( - ) watery eyes Ears, nose, mouth, throat, and face: ( - ) mucositis, ( - ) sore throat Respiratory: ( - ) cough, ( - ) dyspnea, ( - ) wheezes Cardiovascular: ( - ) palpitation, ( - ) chest discomfort, ( - ) lower extremity swelling Gastrointestinal:  ( - ) nausea, ( - ) heartburn, ( - ) change in bowel habits Skin: ( - ) abnormal skin rashes Lymphatics: ( - ) new lymphadenopathy, ( - ) easy bruising Neurological: ( - ) numbness, ( - ) tingling, ( - ) new weaknesses Behavioral/Psych: ( - ) mood change, ( - ) new changes  All other systems were reviewed with the patient and are negative.  PHYSICAL EXAMINATION: ECOG PERFORMANCE STATUS: 1 - Symptomatic but completely ambulatory  Vitals:   12/26/22 0950  BP: (!) 103/56  Pulse: 66  Resp: 14  Temp: (!) 97.2 F (36.2 C)  SpO2: 99%   Filed Weights   12/26/22 0950  Weight: 163 lb 14.4 oz (74.3 kg)    GENERAL: Well-appearing elderly Caucasian male, alert, no distress and comfortable SKIN: skin color, texture, turgor are normal, no rashes or significant lesions EYES: conjunctiva are pink and non-injected, sclera clear LUNGS: clear to auscultation and percussion with normal breathing effort HEART: regular rate & rhythm and no murmurs and no lower extremity edema Musculoskeletal: no cyanosis of digits and no clubbing  PSYCH: alert & oriented x 3, fluent speech NEURO: no focal motor/sensory deficits  LABORATORY DATA:  I have reviewed the data as listed    Latest Ref Rng & Units 12/26/2022    9:15 AM 12/05/2022    9:58 AM 11/14/2022    9:09 AM  CBC  WBC 4.0 - 10.5 K/uL 6.5  10.6  11.3   Hemoglobin 13.0 - 17.0 g/dL 16.1  9.9  9.9   Hematocrit 39.0 - 52.0 % 31.9  29.4  29.8   Platelets 150 -  400 K/uL 231  301  324        Latest Ref Rng & Units 12/26/2022    9:15 AM 12/05/2022    9:58 AM 11/14/2022    9:09 AM  CMP  Glucose 70 - 99 mg/dL 454  098  119   BUN 8 - 23 mg/dL Creatinine 0.61 - 1.24 mg/dL 1.47  8.29  5.62   Sodium 135 - 145 mmol/L 136  136  138   Potassium 3.5 - 5.1 mmol/L 4.0  4.1  4.7   Chloride 98 - 111 mmol/L 103  103  106   CO2 22 - 32 mmol/L Calcium 8.9 - 10.3 mg/dL 8.4  8.2  7.4   Total Protein 6.5 - 8.1 g/dL 5.4  5.7  5.6   Total Bilirubin 0.3 - 1.2 mg/dL 0.3  0.3  0.2   Alkaline Phos 38 - 126 U/L 55  74  104   AST 15 - 41 U/L ALT 0 - 44 U/L RADIOGRAPHIC STUDIES: No results found.  ASSESSMENT & PLAN Danny Wood 72 y.o. male with medical history significant for static urothelial cancer who presents for a follow up visit.   # Stage IV Urothelial Cancer -- Patient has extensively pretreated disease as noted above.  --Today he presents for cycle 4 day 1 of Taxotere.  He is ready to proceed after holding on his last treatment 3 weeks ago. --last CT scan in Jan 2024 showed progression of disease. CT scan currently due to gauge response on Taxotere.  CT scan planned for 12/31/2022 -- Labs today show white blood cell count 6.5, hemoglobin 10.7, MCV 89.1, and platelets of 231.  Creatinine and LFTs within normal limits. -- Plan to return to clinic in 3 weeks time for cycle 5 of chemotherapy.  # Hypomagnesemia # Hypocalcemia -- Calcium today was 8.4.  Patient is taking Tums p.o. to help bolster these levels. -- Magnesium is 1.0, recommend continued p.o. supplementation of his magnesium levels. -- Continue magnesium oxide 400 mg p.o. daily.  Will provide 4 mg of IV magnesium today.  # Pain Control -- Patient is currently following with palliative care. -- Continue oxycodone 15 mg IR for as needed pain control.  Using about 3 to 4/day. -- Long-acting pain medication with MS Contin 30 mg every 12 hours  #Supportive Care -- chemotherapy education complete -- port placed -- zofran  q8H PRN and compazine  PO q6H for nausea -- EMLA cream for port   Orders Placed This Encounter   Procedures   Magnesium    Standing Status:   Future    Number of Occurrences:   1    Standing Expiration Date:   12/26/2023   CBC with Differential (Cancer Center Only)    Standing Status:   Future    Standing Expiration Date:   02/27/2024   CMP (Cancer Center only)    Standing Status:   Future    Standing Expiration Date:   02/27/2024    All questions were answered. The patient knows to call the clinic with any problems, questions or concerns.  A total of more than 30 minutes were spent on this encounter with face-to-face time and non-face-to-face time, including preparing to see the patient, ordering tests and/or medications, counseling the patient and coordination of care as outlined  above.   Ulysees Barns, MD Department of Hematology/Oncology [facility] Phone: [phone] Pager: [phone] Email: [email or web address]  12/26/2022 1:23 PM

## 2022-12-28 ENCOUNTER — Inpatient Hospital Stay: Payer: Medicare Other

## 2022-12-28 VITALS — BP 83/72 | HR 85 | Temp 97.5°F | Resp 18

## 2022-12-28 DIAGNOSIS — C670 Malignant neoplasm of trigone of bladder: Secondary | ICD-10-CM

## 2022-12-28 DIAGNOSIS — Z5111 Encounter for antineoplastic chemotherapy: Secondary | ICD-10-CM | POA: Diagnosis not present

## 2022-12-28 MED ORDER — PEGFILGRASTIM-PBBK 6 MG/0.6ML ~~LOC~~ SOSY
6.0000 mg | PREFILLED_SYRINGE | Freq: Once | SUBCUTANEOUS | Status: AC
  Administered 2022-12-28: 6 mg via SUBCUTANEOUS
  Filled 2022-12-28: qty 0.6

## 2022-12-31 ENCOUNTER — Ambulatory Visit (HOSPITAL_COMMUNITY): Admission: RE | Admit: 2022-12-31 | Payer: Medicare Other | Source: Ambulatory Visit

## 2022-12-31 ENCOUNTER — Telehealth: Payer: Self-pay | Admitting: Hematology and Oncology

## 2022-12-31 NOTE — Telephone Encounter (Signed)
reached out to patient to add injection per wq, left voicemail.

## 2023-01-07 ENCOUNTER — Other Ambulatory Visit: Payer: Self-pay | Admitting: Nurse Practitioner

## 2023-01-07 DIAGNOSIS — G893 Neoplasm related pain (acute) (chronic): Secondary | ICD-10-CM

## 2023-01-07 DIAGNOSIS — C679 Malignant neoplasm of bladder, unspecified: Secondary | ICD-10-CM

## 2023-01-07 DIAGNOSIS — C772 Secondary and unspecified malignant neoplasm of intra-abdominal lymph nodes: Secondary | ICD-10-CM

## 2023-01-07 DIAGNOSIS — Z515 Encounter for palliative care: Secondary | ICD-10-CM

## 2023-01-07 MED ORDER — OXYCODONE HCL 15 MG PO TABS
15.0000 mg | ORAL_TABLET | Freq: Four times a day (QID) | ORAL | 0 refills | Status: DC | PRN
Start: 2023-01-07 — End: 2023-01-28

## 2023-01-08 ENCOUNTER — Telehealth: Payer: Self-pay | Admitting: *Deleted

## 2023-01-08 NOTE — Telephone Encounter (Signed)
Received call from pt requesting a refill of his Oxycodone. Advised that this was refilled yesterday per [facility] Pickenpack,NP Pt voiced understanding.

## 2023-01-14 MED FILL — Dexamethasone Sodium Phosphate Inj 100 MG/10ML: INTRAMUSCULAR | Qty: 1 | Status: AC

## 2023-01-15 ENCOUNTER — Other Ambulatory Visit: Payer: Self-pay

## 2023-01-15 ENCOUNTER — Encounter: Payer: Self-pay | Admitting: Nurse Practitioner

## 2023-01-15 ENCOUNTER — Inpatient Hospital Stay: Payer: Medicare Other | Attending: Oncology

## 2023-01-15 ENCOUNTER — Inpatient Hospital Stay (HOSPITAL_BASED_OUTPATIENT_CLINIC_OR_DEPARTMENT_OTHER): Payer: Medicare Other | Admitting: Nurse Practitioner

## 2023-01-15 ENCOUNTER — Inpatient Hospital Stay: Payer: Medicare Other

## 2023-01-15 ENCOUNTER — Inpatient Hospital Stay (HOSPITAL_BASED_OUTPATIENT_CLINIC_OR_DEPARTMENT_OTHER): Payer: Medicare Other | Admitting: Physician Assistant

## 2023-01-15 VITALS — Ht 74.0 in | Wt 166.2 lb

## 2023-01-15 VITALS — BP 103/52 | HR 60 | Temp 98.3°F | Resp 18

## 2023-01-15 VITALS — BP 106/56 | HR 56 | Temp 98.1°F | Resp 11

## 2023-01-15 DIAGNOSIS — Z85528 Personal history of other malignant neoplasm of kidney: Secondary | ICD-10-CM | POA: Diagnosis not present

## 2023-01-15 DIAGNOSIS — Z515 Encounter for palliative care: Secondary | ICD-10-CM | POA: Diagnosis not present

## 2023-01-15 DIAGNOSIS — Z79899 Other long term (current) drug therapy: Secondary | ICD-10-CM | POA: Diagnosis not present

## 2023-01-15 DIAGNOSIS — C679 Malignant neoplasm of bladder, unspecified: Secondary | ICD-10-CM | POA: Insufficient documentation

## 2023-01-15 DIAGNOSIS — I890 Lymphedema, not elsewhere classified: Secondary | ICD-10-CM | POA: Insufficient documentation

## 2023-01-15 DIAGNOSIS — G893 Neoplasm related pain (acute) (chronic): Secondary | ICD-10-CM | POA: Diagnosis not present

## 2023-01-15 DIAGNOSIS — Z800 Family history of malignant neoplasm of digestive organs: Secondary | ICD-10-CM | POA: Insufficient documentation

## 2023-01-15 DIAGNOSIS — S60221A Contusion of right hand, initial encounter: Secondary | ICD-10-CM | POA: Diagnosis not present

## 2023-01-15 DIAGNOSIS — Z87891 Personal history of nicotine dependence: Secondary | ICD-10-CM | POA: Diagnosis not present

## 2023-01-15 DIAGNOSIS — Z5111 Encounter for antineoplastic chemotherapy: Secondary | ICD-10-CM

## 2023-01-15 DIAGNOSIS — C772 Secondary and unspecified malignant neoplasm of intra-abdominal lymph nodes: Secondary | ICD-10-CM

## 2023-01-15 DIAGNOSIS — C670 Malignant neoplasm of trigone of bladder: Secondary | ICD-10-CM

## 2023-01-15 DIAGNOSIS — Z885 Allergy status to narcotic agent status: Secondary | ICD-10-CM | POA: Insufficient documentation

## 2023-01-15 DIAGNOSIS — S60222A Contusion of left hand, initial encounter: Secondary | ICD-10-CM | POA: Insufficient documentation

## 2023-01-15 DIAGNOSIS — M79604 Pain in right leg: Secondary | ICD-10-CM

## 2023-01-15 DIAGNOSIS — R630 Anorexia: Secondary | ICD-10-CM | POA: Diagnosis not present

## 2023-01-15 DIAGNOSIS — Z9079 Acquired absence of other genital organ(s): Secondary | ICD-10-CM | POA: Diagnosis not present

## 2023-01-15 DIAGNOSIS — R42 Dizziness and giddiness: Secondary | ICD-10-CM

## 2023-01-15 DIAGNOSIS — Z95828 Presence of other vascular implants and grafts: Secondary | ICD-10-CM

## 2023-01-15 DIAGNOSIS — R530 Neoplastic (malignant) related fatigue: Secondary | ICD-10-CM

## 2023-01-15 DIAGNOSIS — Z809 Family history of malignant neoplasm, unspecified: Secondary | ICD-10-CM | POA: Diagnosis not present

## 2023-01-15 LAB — CBC WITH DIFFERENTIAL (CANCER CENTER ONLY)
Abs Immature Granulocytes: 0.05 10*3/uL (ref 0.00–0.07)
Basophils Absolute: 0 10*3/uL (ref 0.0–0.1)
Basophils Relative: 0 %
Eosinophils Absolute: 0 10*3/uL (ref 0.0–0.5)
Eosinophils Relative: 0 %
HCT: 32.2 % — ABNORMAL LOW (ref 39.0–52.0)
Hemoglobin: 10.7 g/dL — ABNORMAL LOW (ref 13.0–17.0)
Immature Granulocytes: 1 %
Lymphocytes Relative: 4 %
Lymphs Abs: 0.5 10*3/uL — ABNORMAL LOW (ref 0.7–4.0)
MCH: 29.5 pg (ref 26.0–34.0)
MCHC: 33.2 g/dL (ref 30.0–36.0)
MCV: 88.7 fL (ref 80.0–100.0)
Monocytes Absolute: 0.6 10*3/uL (ref 0.1–1.0)
Monocytes Relative: 6 %
Neutro Abs: 9.9 10*3/uL — ABNORMAL HIGH (ref 1.7–7.7)
Neutrophils Relative %: 89 %
Platelet Count: 289 10*3/uL (ref 150–400)
RBC: 3.63 MIL/uL — ABNORMAL LOW (ref 4.22–5.81)
RDW: 14.3 % (ref 11.5–15.5)
WBC Count: 11.1 10*3/uL — ABNORMAL HIGH (ref 4.0–10.5)
nRBC: 0 % (ref 0.0–0.2)

## 2023-01-15 LAB — CMP (CANCER CENTER ONLY)
ALT: 13 U/L (ref 0–44)
AST: 17 U/L (ref 15–41)
Albumin: 2.7 g/dL — ABNORMAL LOW (ref 3.5–5.0)
Alkaline Phosphatase: 63 U/L (ref 38–126)
Anion gap: 4 — ABNORMAL LOW (ref 5–15)
BUN: 17 mg/dL (ref 8–23)
CO2: 26 mmol/L (ref 22–32)
Calcium: 7.3 mg/dL — ABNORMAL LOW (ref 8.9–10.3)
Chloride: 107 mmol/L (ref 98–111)
Creatinine: 0.76 mg/dL (ref 0.61–1.24)
GFR, Estimated: >60 mL/min (ref >60)
Glucose, Bld: 154 mg/dL — ABNORMAL HIGH (ref 70–99)
Potassium: 4 mmol/L (ref 3.5–5.1)
Sodium: 137 mmol/L (ref 135–145)
Total Bilirubin: 0.2 mg/dL — ABNORMAL LOW (ref 0.3–1.2)
Total Protein: 5.1 g/dL — ABNORMAL LOW (ref 6.5–8.1)

## 2023-01-15 MED ORDER — SODIUM CHLORIDE 0.9% FLUSH: 10.0000 mL | INTRAVENOUS | Status: DC | PRN

## 2023-01-15 MED ORDER — HEPARIN SOD (PORK) LOCK FLUSH 100 UNIT/ML IV SOLN
500.0000 [IU] | Freq: Once | INTRAVENOUS | Status: AC | PRN
  Administered 2023-01-15: 500 [IU]

## 2023-01-15 MED ORDER — GABAPENTIN 300 MG PO CAPS: ORAL_CAPSULE | ORAL | 0 refills | Status: DC

## 2023-01-15 MED ORDER — SODIUM CHLORIDE 0.9% FLUSH
10.0000 mL | Freq: Once | INTRAVENOUS | Status: AC | PRN
  Administered 2023-01-15: 10 mL

## 2023-01-15 MED ORDER — SODIUM CHLORIDE 0.9 % IV SOLN
10.0000 mg | Freq: Once | INTRAVENOUS | Status: DC
  Filled 2023-01-15: qty 1

## 2023-01-15 MED ORDER — SODIUM CHLORIDE 0.9 % IV SOLN: Freq: Once | INTRAVENOUS | Status: AC

## 2023-01-15 MED ORDER — HYDROMORPHONE HCL 4 MG PO TABS: 4.0000 mg | ORAL_TABLET | Freq: Four times a day (QID) | ORAL | 0 refills | Status: DC | PRN

## 2023-01-15 MED ORDER — SODIUM CHLORIDE 0.9 % IV SOLN: 60.0000 mg/m2 | Freq: Once | INTRAVENOUS | Status: DC

## 2023-01-15 NOTE — Patient Instructions (Signed)

## 2023-01-15 NOTE — Progress Notes (Signed)
Patient here for port flush and labs. Says he doesn't want to do chemo today and would like to get fluids; provider notified. 98.3 oral, 18, 60, 100% RA, 103/52.

## 2023-01-15 NOTE — Progress Notes (Signed)
Palliative Medicine [facility]  Telephone:(336) [phone] Fax:(336) [phone]   Name: Danny Wood Date: 01/15/2023 MRN: [id number]  DOB: 11/03/1950  Patient Care Team: Tally Joe, MD as PCP - General (Family Medicine)   INTERVAL HISTORY: Danny Wood is a 72 y.o. male with oncologic medical history including bladder cancer diagnosed in 2015.  He developed stage IV high-grade urothelial carcinoma with pelvic adenopathy in 2019 despite chemotherapy treatments. Recent treatment change in July 2023 due to further progression with chemoradiation. Actively being treated with Sacituzumab.  Palliative ask to see for symptom management and goals of care.   SOCIAL HISTORY:    [name] reports that he quit smoking about 18 years ago. His smoking use included cigarettes. He has a 12.50 pack-year smoking history. He has never used smokeless tobacco. He reports current alcohol use of about 2.0 standard drinks of alcohol per week. He reports that he does not use drugs.  ADVANCE DIRECTIVES: none on file   CODE STATUS: FULL CODE  PAST MEDICAL HISTORY: Past Medical History:  Diagnosis Date   At risk for sleep apnea    STOP-BANG= 4       SENT TO PCP 05-16-2014   bladder ca dx'd 05/17/14   met dz 12/2017   Bladder disorder    Hematuria    History of renal cell carcinoma    2003  S/P  PARTIAL RIGHT NEPHRECTOMY   Presence of surgical incision    lumbar diskectomy 05-11-2014-  bandage present   Urgency of urination     ALLERGIES:  is allergic to dilaudid [hydromorphone].  MEDICATIONS:  Current Outpatient Medications  Medication Sig Dispense Refill   Clotrimazole 1 % LOTN Apply 1 Application topically 2 (two) times daily as needed. 30 mL 0   diphenoxylate-atropine (LOMOTIL) 2.5-0.025 MG tablet 1 to 2 PO QID prn diarrhea 60 tablet 3   furosemide (LASIX) 20 MG tablet Take 2 tablets (40 mg total) by mouth daily. (Patient not taking: Reported on  04/24/2022) 90 tablet 1   loperamide (IMODIUM) 2 MG capsule Take 1 capsule (2 mg total) by mouth as needed for diarrhea or loose stools. 60 capsule 1   magnesium oxide (MAG-OX) 400 (240 Mg) MG tablet Take 1 tablet (400 mg total) by mouth daily. 90 tablet 1   megestrol (MEGACE) 20 MG tablet Take 1 tablet (20 mg total) by mouth 2 (two) times daily. 30 tablet 0   morphine (MS CONTIN) 30 MG 12 hr tablet Take 1 tablet (30 mg total) by mouth every 12 (twelve) hours. 60 tablet 0   nystatin (MYCOSTATIN/NYSTOP) powder Apply 1 Application topically 3 (three) times daily. 30 g 0   ondansetron (ZOFRAN) 8 MG tablet Take 1 tablet (8 mg total) by mouth every 8 (eight) hours as needed for nausea or vomiting. 30 tablet 0   oxyCODONE (ROXICODONE) 15 MG immediate release tablet Take 1 tablet (15 mg total) by mouth every 6 (six) hours as needed for pain. 60 tablet 0   No current facility-administered medications for this visit.    VITAL SIGNS: There were no vitals taken for this visit. There were no vitals filed for this visit.  Estimated body mass index is 21.04 kg/m as calculated from the following:   Height as of 10/31/22: 6\' 2"  (1.88 m).   Weight as of 12/26/22: 163 lb 14.4 oz (74.3 kg).   PERFORMANCE STATUS (ECOG) : 1 - Symptomatic but completely ambulatory  Physical Exam General: weak appearing  Cardiovascular: regular rate and rhythm Pulmonary: normal breathing pattern  Abdomen: soft, nontender, + bowel sounds Extremities: no joint deformities, right leg (mid-thigh to foot) swollen  Skin: no rashes Neurological: alert and oriented x 4  IMPRESSION:  I saw Danny Wood during his infusion. He is resting comfortably in recliner. Shares ongoing fatigue and ongoing right leg swelling. Appetite fluctuates some days better than others. Is spending more time in bed due to feeling so poorly.   Neoplasm related pain (abdomen) Mr. Round endorses ongoing pain. Complains of abdomen and right leg  pain. Now with increased swelling to that extremity. Describes pain as stabbing and burning. Nothing provides relief. Has tried to reposition, use of heat in addition to pain medication. He has been scheduled per Oncology for doppler to rule out DVT.    We reviewed current regimen. He is taking MS Contin 30 mg BID. Oxycodone 15 mg available every 4-6 hours as needed for breakthrough pain. He is taking the Oxycodone 3-4 times per day. States it is not as effective as it previously was. Pain seems to be more severe that weeks prior. Education provided on use of hydromorphone versus oxycodone. We attempted hydromorphone previously however was not in conjunction with long-acting. He is open to retrying. He has previous bottle of hydromorphone available. He now has in his possession and will restart. Knows to hold on use of oxycodone and should not be taking both. Education provided on use of gabapentin three times daily. He was previously using however with minimal relief. Agreeable to restart.   He understands we will continue to closely monitor and make adjustments to medications as needed.      Diarrhea/constipation Controlled.    Decreased appetite/weight loss  Some days are better than others. Is trying to eat when hungry and increase protein intake. Current weight 166lbs up from 159lbs on 4/4. Also will consider lower extremity swelling.   We discussed the importance of continued conversation with family and their medical providers regarding overall plan of care and treatment options, ensuring decisions are within the context of the patients values and GOCs.  PLAN:  MS Contin 30 mg every 12 hours.   Oxycodone 15 mg every 4-6 hours as needed for breakthrough pain. On hold.  Start hydromorphone 2mg  every 6 hours as needed.  Continue protein supplement drinks as able. Has experienced good weight gain. Ongoing goals of care and symptom management as needed. I will plan to see patient back in 3-4  weeks in collaboration to other oncology appointments. Will follow-up with telephone visit in 1 week.  Patient knows to contact our office if needed sooner.   Patient expressed understanding and was in agreement with this plan. He also understands that he can call the clinic at any time with any questions, concerns, or complaints.   Any controlled substances utilized were prescribed in the context of palliative care. PDMP has been reviewed.    Visit consisted of counseling and education dealing with the complex and emotionally intense issues of symptom management and palliative care in the setting of serious and potentially life-threatening illness.Greater than 50%  of this time was spent counseling and coordinating care related to the above assessment and plan.    Willette Alma, AGPCNP-BC  Palliative Medicine Team/[facility]  *Please note that this is a verbal dictation therefore any spelling or grammatical errors are due to the "[employer]agon [employer] One" system interpretation.

## 2023-01-16 ENCOUNTER — Ambulatory Visit (HOSPITAL_COMMUNITY)
Admission: RE | Admit: 2023-01-16 | Discharge: 2023-01-16 | Disposition: A | Discharge status: Home / Self Care | Payer: Medicare Other | Source: Ambulatory Visit | Attending: Physician Assistant | Admitting: Physician Assistant

## 2023-01-16 ENCOUNTER — Telehealth: Payer: Self-pay

## 2023-01-16 DIAGNOSIS — M79604 Pain in right leg: Secondary | ICD-10-CM | POA: Diagnosis not present

## 2023-01-16 NOTE — Progress Notes (Signed)
Right lower extremity venous duplex has been completed. Preliminary results can be found in CV Proc through chart review.  Results were given to [facility] at [place] office.  01/16/23 10:04 AM Olen Cordial RVT

## 2023-01-16 NOTE — Telephone Encounter (Signed)
Pt advised doppler of rt lower extremity was negative for DVT

## 2023-01-17 ENCOUNTER — Inpatient Hospital Stay: Payer: Medicare Other

## 2023-01-17 NOTE — Progress Notes (Unsigned)
Palliative Medicine [facility]  Telephone:([phone] [phone]   Name: Danny Wood Date: 01/17/2023 MRN: [id number]  DOB: 07-Aug-1951  Patient Care Team: Tally Joe, MD as PCP - General (Family Medicine)   INTERVAL HISTORY: Danny Wood is a 72 y.o. male with oncologic medical history including bladder cancer diagnosed in 2015.  He developed stage IV high-grade urothelial carcinoma with pelvic adenopathy in 2019 despite chemotherapy treatments. Recent treatment change in July 2023 due to further progression with chemoradiation. Actively being treated with Sacituzumab.  Palliative ask to see for symptom management and goals of care.   SOCIAL HISTORY:    Danny Wood reports that he quit smoking about 18 years ago. His smoking use included cigarettes. He has a 12.50 pack-year smoking history. He has never used smokeless tobacco. He reports current alcohol use of about 2.0 standard drinks of alcohol per week. He reports that he does not use drugs.  ADVANCE DIRECTIVES: none on file   CODE STATUS: FULL CODE  PAST MEDICAL HISTORY: Past Medical History:  Diagnosis Date   At risk for sleep apnea    STOP-BANG= 4       SENT TO PCP 05-16-2014   bladder ca dx'd 05/17/14   met dz 12/2017   Bladder disorder    Hematuria    History of renal cell carcinoma    2003  S/P  PARTIAL RIGHT NEPHRECTOMY   Presence of surgical incision    lumbar diskectomy 05-11-2014-  bandage present   Urgency of urination     ALLERGIES:  is allergic to dilaudid [hydromorphone].  MEDICATIONS:  Current Outpatient Medications  Medication Sig Dispense Refill   Clotrimazole 1 % LOTN Apply 1 Application topically 2 (two) times daily as needed. 30 mL 0   diphenoxylate-atropine (LOMOTIL) 2.5-0.025 MG tablet 1 to 2 PO QID prn diarrhea 60 tablet 3   furosemide (LASIX) 20 MG tablet Take 2 tablets (40 mg total) by mouth daily. (Patient not taking: Reported on  04/24/2022) 90 tablet 1   gabapentin (NEURONTIN) 300 MG capsule Take 1 capsule (300mg ) by mouth in the morning and afternoon, take 2 caspules (600mg ) at bedtime 60 capsule 0   HYDROmorphone (DILAUDID) 4 MG tablet Take 1 tablet (4 mg total) by mouth every 6 (six) hours as needed for severe pain. 30 tablet 0   loperamide (IMODIUM) 2 MG capsule Take 1 capsule (2 mg total) by mouth as needed for diarrhea or loose stools. 60 capsule 1   magnesium oxide (MAG-OX) 400 (240 Mg) MG tablet Take 1 tablet (400 mg total) by mouth daily. 90 tablet 1   megestrol (MEGACE) 20 MG tablet Take 1 tablet (20 mg total) by mouth 2 (two) times daily. 30 tablet 0   morphine (MS CONTIN) 30 MG 12 hr tablet Take 1 tablet (30 mg total) by mouth every 12 (twelve) hours. 60 tablet 0   nystatin (MYCOSTATIN/NYSTOP) powder Apply 1 Application topically 3 (three) times daily. 30 g 0   ondansetron (ZOFRAN) 8 MG tablet Take 1 tablet (8 mg total) by mouth every 8 (eight) hours as needed for nausea or vomiting. 30 tablet 0   oxyCODONE (ROXICODONE) 15 MG immediate release tablet Take 1 tablet (15 mg total) by mouth every 6 (six) hours as needed for pain. 60 tablet 0   No current facility-administered medications for this visit.    VITAL SIGNS: There were no vitals taken for this visit. There were no vitals filed for this visit.  Estimated body mass index is 21.34 kg/m as calculated from the following:   Height as of 01/15/23: 6\' 2"  (1.88 m).   Weight as of 01/15/23: 166 lb 3.2 oz (75.4 kg).   PERFORMANCE STATUS (ECOG) : 1 - Symptomatic but completely ambulatory  Physical Exam General: weak appearing  Cardiovascular: regular rate and rhythm Pulmonary: normal breathing pattern  Abdomen: soft, nontender, + bowel sounds Extremities: no joint deformities, right leg (mid-thigh to foot) swollen  Skin: no rashes Neurological: alert and oriented x 4  IMPRESSION:    Neoplasm related pain (abdomen) Danny Wood endorses ongoing  pain. Complains of abdomen and right leg pain. Now with increased swelling to that extremity. Describes pain as stabbing and burning. Nothing provides relief. Has tried to reposition, use of heat in addition to pain medication. He has been scheduled per Oncology for doppler to rule out DVT.    We reviewed current regimen. He is taking MS Contin 30 mg BID. Oxycodone 15 mg available every 4-6 hours as needed for breakthrough pain. He is taking the Oxycodone 3-4 times per day. States it is not as effective as it previously was. Pain seems to be more severe that weeks prior. Education provided on use of hydromorphone versus oxycodone. We attempted hydromorphone previously however was not in conjunction with long-acting. He is open to retrying. He has previous bottle of hydromorphone available. He now has in his possession and will restart. Knows to hold on use of oxycodone and should not be taking both. Education provided on use of gabapentin three times daily. He was previously using however with minimal relief. Agreeable to restart.   He understands we will continue to closely monitor and make adjustments to medications as needed.      Diarrhea/constipation Controlled.    Decreased appetite/weight loss    We discussed the importance of continued conversation with family and their medical providers regarding overall plan of care and treatment options, ensuring decisions are within the context of the patients values and GOCs.  PLAN:  MS Contin 30 mg every 12 hours.   Oxycodone 15 mg every 4-6 hours as needed for breakthrough pain. On hold.  Start hydromorphone 2mg  every 6 hours as needed.  Continue protein supplement drinks as able. Has experienced good weight gain. Ongoing goals of care and symptom management as needed. I will plan to see patient back in 3-4 weeks in collaboration to other oncology appointments. Will follow-up with telephone visit in 1 week.  Patient knows to contact our office  if needed sooner.   Patient expressed understanding and was in agreement with this plan. He also understands that he can call the clinic at any time with any questions, concerns, or complaints.   Any controlled substances utilized were prescribed in the context of palliative care. PDMP has been reviewed.    Visit consisted of counseling and education dealing with the complex and emotionally intense issues of symptom management and palliative care in the setting of serious and potentially life-threatening illness.Greater than 50%  of this time was spent counseling and coordinating care related to the above assessment and plan.    Willette Alma, AGPCNP-BC  Palliative Medicine Team/[facility]  *Please note that this is a verbal dictation therefore any spelling or grammatical errors are due to the "Dragon Medical One" system interpretation.

## 2023-01-19 ENCOUNTER — Encounter: Payer: Self-pay | Admitting: Oncology

## 2023-01-19 ENCOUNTER — Encounter: Payer: Self-pay | Admitting: Hematology and Oncology

## 2023-01-19 NOTE — Progress Notes (Signed)
[name] Telephone:([phone]) [phone]   Fax:(336) [phone]  PROGRESS NOTE  Patient Care Team: Tally Joe, MD as PCP - General (Family Medicine)  Hematological/Oncological History # Stage IV High Grade Urothelial Carcinoma 05/17/2014: patient underwent TURBT  06/17/2014: Neoadjuvant systemic chemotherapy in the form of gemcitabine and cisplatin  09/09/2014: robotic cystoprostatectomy and bilateral lymphadenectomy. The final pathology revealed T2N0 without any lymph node involvement.  02/17/2018: Carboplatin and gemcitabine.  He completed 8 cycles of therapy in December 2019.  04/01/2019:  Pembrolizumab 200 mg every 3 weeks. Therapy stopped because of of patient preference, excellent clinical response and treatment holiday.  He developed progression of disease in June 2021.  02/11/2020: Enfortumab vedotin. He has been receiving monthly maintenance therapy up till March 2023.  He developed disease progression at that time.  [id number] analysis showed an ATM mutation as well as ERBB2 mutation  02/11/2020: started Olaparib 300 mg BID. Therapy discontinued in July 2023 for progression of disease.  08/2022: Palliative radiation therapy to right iliac and periaortic lymph node.  He completed 10 fractions for 30 Gy  03/12/2022: Sacituzumab govitecan continued x 9 cycles. D/c due to progression 09/13/2022: last visit with Dr. Clelia Croft.  10/03/2022: Transition care to Dr. Leonides Schanz. Cycle 1 Day 1 of Taxotere at 60 mg/m every 3 weeks  10/24/2022: Cycle 2 Day 1 of Taxotere at 60 mg/m  11/14/2022: Cycle 3 Day 1 of Taxotere at 60 mg/m  12/04/2022: Cycle 4 Day 1 of Taxotere at 60 mg/m HELD due to patient request.  12/26/2022: Cycle 4 Day 1 of Taxotere at 60 mg/m 01/15/2023: Cycle 5 Day 1 of Taxotere at 60 mg/m2 HELD due to patient's request/poor tolerance.   Interval History:  [name] 72 y.o. male with medical history significant for static urothelial cancer who presents for a follow up  visit. The patient's last visit was on 12/26/2022. In the interim since the last visit he completed cycle 4 of taxotere.   On exam today [name] reports he is not feeling well today. He reports worsening fatigue which interferes with his ability to complete his ADLs. He has a poor appetite without weight loss. He reports dizziness and poor balance for the last 1-2 months. He adds that his left eye is very blurry. He denies nausea, vomiting. He continues to have ongoing abdominal pain and worsening right upper thigh pain that is more swollen. He is current on MS contin 30 mg BID and oxycodone 15 mg q 4-6 hours for breakthrough pain. His bowel habits are unchanged without recurrent episodes of diarrhea or constipation. He does have some mild bruising involving his hands. He denies any signs of overt bleeding including hematochezia, melena or hematuria. He denies any urinary symptoms. He does have shortness of breath with exertion. He denies fevers, chills, sweats, chest pain or cough. He has no other complaints. Rest of the ROS is below.    MEDICAL HISTORY:  Past Medical History:  Diagnosis Date   At risk for sleep apnea    STOP-BANG= 4       SENT TO PCP 05-16-2014   bladder ca dx'd 05/17/14   met dz 12/2017   Bladder disorder    Hematuria    History of renal cell carcinoma    2003  S/P  PARTIAL RIGHT NEPHRECTOMY   Presence of surgical incision    lumbar diskectomy 05-11-2014-  bandage present   Urgency of urination     SURGICAL HISTORY: Past Surgical History:  Procedure Laterality Date  CYSTOSCOPY N/A 09/09/2014   Procedure: CYSTOSCOPY WITH INDOCYANINE GREEN DYE INJECTION AND URETHRAL DILATION;  Surgeon: Sebastian Ache, MD;  Location: [facility];  Service: Urology;  Laterality: N/A;   CYSTOSCOPY WITH BIOPSY N/A 05/17/2014   Procedure: CYSTO WITH BLADDER BIOPSY;  Surgeon: Anner Crete, MD;  Location: [facility];  Service: Urology;  Laterality: N/A;   EVALUATION UNDER  ANESTHESIA WITH HEMORRHOIDECTOMY  07/07/2012   Procedure: EXAM UNDER ANESTHESIA WITH HEMORRHOIDECTOMY;  Surgeon: Clovis Pu. Cornett, MD;  Location: [facility];  Service: General;  Laterality: N/A;   IR IMAGING GUIDED PORT INSERTION  02/20/2018   LUMBAR MICRODISCECTOMY  05-11-2014   L4 -- L5 (partial)   PARTIAL NEPHRECTOMY Right 2003   ROBOT ASSISTED LAPAROSCOPIC COMPLETE CYSTECT ILEAL CONDUIT N/A 09/09/2014   Procedure: ROBOTIC ASSISTED LAPAROSCOPIC COMPLETE CYSTOPROSTATECTOMY,  ILEAL CONDUIT;  Surgeon: Sebastian Ache, MD;  Location: [facility];  Service: Urology;  Laterality: N/A;    SOCIAL HISTORY: Social History   Socioeconomic History   Marital status: Married    Spouse name: Not on file   Number of children: Not on file   Years of education: Not on file   Highest education level: Not on file  Occupational History   Not on file  Tobacco Use   Smoking status: Former    Packs/day: 0.50    Years: 25.00    Additional pack years: 0.00    Total pack years: 12.50    Types: Cigarettes    Quit date: 06/02/2004    Years since quitting: 18.6   Smokeless tobacco: Never  Substance and Sexual Activity   Alcohol use: Yes    Alcohol/week: 2.0 standard drinks of alcohol    Types: 2 Standard drinks or equivalent per week   Drug use: No   Sexual activity: Not on file  Other Topics Concern   Not on file  Social History Narrative   Not on file   Social Determinants of Health   Financial Resource Strain: Not on file  Food Insecurity: No Food Insecurity (05/08/2022)   Hunger Vital Sign    Worried About Running Out of Food in the Last Year: Never true    Ran Out of Food in the Last Year: Never true  Transportation Needs: Not on file  Physical Activity: Not on file  Stress: Not on file  Social Connections: Not on file  Intimate Partner Violence: Not on file    FAMILY HISTORY: Family History  Problem Relation Age of Onset   Cancer Father        unk type   Colon cancer Maternal Uncle     Diabetes Neg Hx     ALLERGIES:  is allergic to dilaudid [hydromorphone].  MEDICATIONS:  Current Outpatient Medications  Medication Sig Dispense Refill   Clotrimazole 1 % LOTN Apply 1 Application topically 2 (two) times daily as needed. 30 mL 0   diphenoxylate-atropine (LOMOTIL) 2.5-0.025 MG tablet 1 to 2 PO QID prn diarrhea 60 tablet 3   loperamide (IMODIUM) 2 MG capsule Take 1 capsule (2 mg total) by mouth as needed for diarrhea or loose stools. 60 capsule 1   magnesium oxide (MAG-OX) 400 (240 Mg) MG tablet Take 1 tablet (400 mg total) by mouth daily. 90 tablet 1   megestrol (MEGACE) 20 MG tablet Take 1 tablet (20 mg total) by mouth 2 (two) times daily. 30 tablet 0   morphine (MS CONTIN) 30 MG 12 hr tablet Take 1 tablet (30 mg total) by mouth every  12 (twelve) hours. 60 tablet 0   nystatin (MYCOSTATIN/NYSTOP) powder Apply 1 Application topically 3 (three) times daily. 30 g 0   ondansetron (ZOFRAN) 8 MG tablet Take 1 tablet (8 mg total) by mouth every 8 (eight) hours as needed for nausea or vomiting. 30 tablet 0   oxyCODONE (ROXICODONE) 15 MG immediate release tablet Take 1 tablet (15 mg total) by mouth every 6 (six) hours as needed for pain. 60 tablet 0   furosemide (LASIX) 20 MG tablet Take 2 tablets (40 mg total) by mouth daily. (Patient not taking: Reported on 04/24/2022) 90 tablet 1   gabapentin (NEURONTIN) 300 MG capsule Take 1 capsule (300mg ) by mouth in the morning and afternoon, take 2 caspules (600mg ) at bedtime 60 capsule 0   HYDROmorphone (DILAUDID) 4 MG tablet Take 1 tablet (4 mg total) by mouth every 6 (six) hours as needed for severe pain. 30 tablet 0   No current facility-administered medications for this visit.    REVIEW OF SYSTEMS:   Constitutional: ( - ) fevers, ( - )  chills , ( - ) night sweats Eyes: ( - ) blurriness of vision, ( - ) double vision, ( - ) watery eyes Ears, nose, mouth, throat, and face: ( - ) mucositis, ( - ) sore throat Respiratory: ( - ) cough, ( - )  dyspnea, ( - ) wheezes Cardiovascular: ( - ) palpitation, ( - ) chest discomfort, ( - ) lower extremity swelling Gastrointestinal:  ( - ) nausea, ( - ) heartburn, ( - ) change in bowel habits Skin: ( - ) abnormal skin rashes Lymphatics: ( - ) new lymphadenopathy, ( - ) easy bruising Neurological: ( - ) numbness, ( - ) tingling, ( - ) new weaknesses Behavioral/Psych: ( - ) mood change, ( - ) new changes  All other systems were reviewed with the patient and are negative.  PHYSICAL EXAMINATION: ECOG PERFORMANCE STATUS: 1 - Symptomatic but completely ambulatory  There were no vitals filed for this visit.  Filed Weights   01/15/23 0951  Weight: 166 lb 3.2 oz (75.4 kg)    GENERAL: Well-appearing elderly Caucasian male, alert, no distress and comfortable SKIN: skin color, texture, turgor are normal, no rashes or significant lesions EYES: conjunctiva are pink and non-injected, sclera clear LUNGS: clear to auscultation and percussion with normal breathing effort HEART: regular rate & rhythm and no murmurs. Right upper leg extremity edema that is tender to palpation and erythematous. Musculoskeletal: no cyanosis of digits and no clubbing  PSYCH: alert & oriented x 3, fluent speech NEURO: no focal motor/sensory deficits  LABORATORY DATA:  I have reviewed the data as listed    Latest Ref Rng & Units 01/15/2023    9:34 AM 12/26/2022    9:15 AM 12/05/2022    9:58 AM  CBC  WBC 4.0 - 10.5 K/uL 11.1  6.5  10.6   Hemoglobin 13.0 - 17.0 g/dL 40.9  81.1  9.9   Hematocrit 39.0 - 52.0 % 32.2  31.9  29.4   Platelets 150 - 400 K/uL 289  231  301        Latest Ref Rng & Units 01/15/2023    9:34 AM 12/26/2022    9:15 AM 12/05/2022    9:58 AM  CMP  Glucose 70 - 99 mg/dL 914  782  956   BUN 8 - 23 mg/dL 17  19  21    Creatinine 0.61 - 1.24 mg/dL 2.13  0.86  5.78  Sodium 135 - 145 mmol/L 137  136  136   Potassium 3.5 - 5.1 mmol/L 4.0  4.0  4.1   Chloride 98 - 111 mmol/L 107  103  103   CO2 22 - 32  mmol/L 26  30  26    Calcium 8.9 - 10.3 mg/dL 7.3  8.4  8.2   Total Protein 6.5 - 8.1 g/dL 5.1  5.4  5.7   Total Bilirubin 0.3 - 1.2 mg/dL 0.2  0.3  0.3   Alkaline Phos 38 - 126 U/L 63  55  74   AST 15 - 41 U/L 17  18  18    ALT 0 - 44 U/L 13  13  17      RADIOGRAPHIC STUDIES: VAS [place] LOWER EXTREMITY VENOUS (DVT)  Result Date: 01/16/2023  Lower Venous DVT Study Patient Name:  CORELL ORLOFF  Date of Exam:   01/16/2023 Medical Rec #: [id number]              Accession #:    [id number] Date of Birth: [birth year]             Patient Gender: M Patient Age:   72 years Exam Location:  [facility] Procedure:      VAS [place] LOWER EXTREMITY VENOUS (DVT) Referring Phys: Georga Kaufmann --------------------------------------------------------------------------------  Indications: Swelling.  Risk Factors: Cancer. Limitations: Poor ultrasound/tissue interface and patient pain tolerance. Comparison Study: No prior studies. Performing Technologist: Chanda Busing RVT  Examination Guidelines: A complete evaluation includes B-mode imaging, spectral Doppler, color Doppler, and power Doppler as needed of all accessible portions of each vessel. Bilateral testing is considered an integral part of a complete examination. Limited examinations for reoccurring indications may be performed as noted. The reflux portion of the exam is performed with the patient in reverse Trendelenburg.  +---------+---------------+---------+-----------+----------+--------------+ RIGHT    CompressibilityPhasicitySpontaneityPropertiesThrombus Aging +---------+---------------+---------+-----------+----------+--------------+ CFV      Full           Yes      Yes                                 +---------+---------------+---------+-----------+----------+--------------+ SFJ      Full                                                        +---------+---------------+---------+-----------+----------+--------------+ FV Prox  Full                                                         +---------+---------------+---------+-----------+----------+--------------+ FV Mid   Full           Yes      Yes                                 +---------+---------------+---------+-----------+----------+--------------+ FV Distal               Yes      Yes                                 +---------+---------------+---------+-----------+----------+--------------+  PFV      Full                                                        +---------+---------------+---------+-----------+----------+--------------+ POP      Full           Yes      Yes                                 +---------+---------------+---------+-----------+----------+--------------+ PTV      Full                                                        +---------+---------------+---------+-----------+----------+--------------+ PERO     Full                                                        +---------+---------------+---------+-----------+----------+--------------+   +----+---------------+---------+-----------+----------+--------------+ LEFTCompressibilityPhasicitySpontaneityPropertiesThrombus Aging +----+---------------+---------+-----------+----------+--------------+ CFV Full           Yes      Yes                                 +----+---------------+---------+-----------+----------+--------------+    Summary: RIGHT: - There is no evidence of deep vein thrombosis in the lower extremity. However, portions of this examination were limited- see technologist comments above.  - No cystic structure found in the popliteal fossa.  LEFT: - No evidence of common femoral vein obstruction.  *See table(s) above for measurements and observations. Electronically signed by Heath Lark on 01/16/2023 at 4:55:01 PM.    Final     ASSESSMENT & PLAN Danny Wood 72 y.o. male with medical history significant for static urothelial cancer who  presents for a follow up visit.   # Stage IV Urothelial Cancer -- Patient has extensively pretreated disease as noted above.  --Today he presents for cycle 5 day 1 of Taxotere.  --last CT scan in Jan 2024 showed progression of disease. Next CT scan planned for 01/21/2023 -- Labs today show white blood cell count 11.1, hemoglobin 10.7, MCV 88.7, and platelets of 289.  Creatinine and LFTs within normal limits. -- HOLD treatment today due to poor appetite, worsening fatigue. Will arrange for 1 L of IV NS fluids today.  -- Plan to return to clinic in 1 week time for re-evaluation of cycle 5 of chemotherapy.  #Dizziness #Balance issues #Right eye blurriness: --Will obtain MRI brain to further evaluate and rule out intracranial metastases  #Right thigh edema: --Chronic in nature and last noted on CT right femur from 12/06/2022. "Chronic fluctuating subcutaneous edema in the low anterior abdominal wall and right thigh" --will obtain doppler US to rule out DVT today  # Hypomagnesemia # Hypocalcemia -- Calcium today was 7.3 today.  Patient is taking Tums p.o. to help bolster these levels. -- Magnesium was 1.0 on 12/26/2022, recommend continued p.o. supplementation of his  magnesium levels. -- Continue magnesium oxide 400 mg p.o. daily.    # Pain Control -- Patient is currently following with palliative care. -- Continue oxycodone 15 mg IR for as needed pain control.  Using about 3 to 4/day. -- Long-acting pain medication with MS Contin 30 mg every 12 hours  #Supportive Care -- chemotherapy education complete -- port placed -- zofran 8mg  q8H PRN and compazine 10mg  PO q6H for nausea -- EMLA cream for port   Orders Placed This Encounter  Procedures   MR Brain W Wo Contrast    Standing Status:   Future    Standing Expiration Date:   01/15/2024    Order Specific Question:   If indicated for the ordered procedure, I authorize the administration of contrast media per Radiology protocol     Answer:   Yes    Order Specific Question:   What is the patient's sedation requirement?    Answer:   No Sedation    Order Specific Question:   Does the patient have a pacemaker or implanted devices?    Answer:   No    Order Specific Question:   Use SRS Protocol?    Answer:   Yes    Order Specific Question:   Preferred imaging location?    Answer:   [facility] (table limit - 550 lbs)    All questions were answered. The patient knows to call the clinic with any problems, questions or concerns.  I have spent a total of 30 minutes minutes of face-to-face and non-face-to-face time, preparing to see the patient,performing a medically appropriate examination, counseling and educating the patient, ordering tests/procedures,documenting clinical information in the electronic health record, and care coordination.   Georga Kaufmann PA-C Dept of Hematology and Oncology [facility] Phone: [phone]   01/19/2023 12:44 PM

## 2023-01-20 ENCOUNTER — Encounter: Payer: Self-pay | Admitting: Nurse Practitioner

## 2023-01-20 ENCOUNTER — Inpatient Hospital Stay (HOSPITAL_BASED_OUTPATIENT_CLINIC_OR_DEPARTMENT_OTHER): Payer: Medicare Other | Admitting: Nurse Practitioner

## 2023-01-20 DIAGNOSIS — Z515 Encounter for palliative care: Secondary | ICD-10-CM

## 2023-01-20 DIAGNOSIS — R530 Neoplastic (malignant) related fatigue: Secondary | ICD-10-CM

## 2023-01-20 DIAGNOSIS — G893 Neoplasm related pain (acute) (chronic): Secondary | ICD-10-CM | POA: Diagnosis not present

## 2023-01-20 DIAGNOSIS — C679 Malignant neoplasm of bladder, unspecified: Secondary | ICD-10-CM | POA: Diagnosis not present

## 2023-01-20 DIAGNOSIS — C772 Secondary and unspecified malignant neoplasm of intra-abdominal lymph nodes: Secondary | ICD-10-CM

## 2023-01-21 ENCOUNTER — Ambulatory Visit (HOSPITAL_COMMUNITY)
Admission: RE | Admit: 2023-01-21 | Discharge: 2023-01-21 | Disposition: A | Discharge status: Home / Self Care | Payer: Medicare Other | Source: Ambulatory Visit | Attending: Hematology and Oncology | Admitting: Hematology and Oncology

## 2023-01-21 ENCOUNTER — Encounter: Payer: Self-pay | Admitting: Oncology

## 2023-01-21 ENCOUNTER — Encounter: Payer: Self-pay | Admitting: Hematology and Oncology

## 2023-01-21 DIAGNOSIS — R530 Neoplastic (malignant) related fatigue: Secondary | ICD-10-CM | POA: Insufficient documentation

## 2023-01-21 DIAGNOSIS — C61 Malignant neoplasm of prostate: Secondary | ICD-10-CM | POA: Diagnosis present

## 2023-01-21 MED ORDER — IOHEXOL 9 MG/ML PO SOLN
ORAL | Status: AC
  Filled 2023-01-21: qty 1000

## 2023-01-21 MED ORDER — HEPARIN SOD (PORK) LOCK FLUSH 100 UNIT/ML IV SOLN
INTRAVENOUS | Status: AC
  Filled 2023-01-21: qty 5

## 2023-01-21 MED ORDER — HEPARIN SOD (PORK) LOCK FLUSH 100 UNIT/ML IV SOLN
500.0000 [IU] | Freq: Once | INTRAVENOUS | Status: AC
  Administered 2023-01-21: 500 [IU] via INTRAVENOUS

## 2023-01-21 MED ORDER — IOHEXOL 300 MG/ML  SOLN
100.0000 mL | Freq: Once | INTRAMUSCULAR | Status: AC | PRN
  Administered 2023-01-21: 100 mL via INTRAVENOUS

## 2023-01-22 ENCOUNTER — Encounter (HOSPITAL_COMMUNITY): Payer: Self-pay

## 2023-01-22 ENCOUNTER — Ambulatory Visit (HOSPITAL_COMMUNITY)
Admission: RE | Admit: 2023-01-22 | Discharge: 2023-01-22 | Disposition: A | Discharge status: Home / Self Care | Payer: Medicare Other | Source: Ambulatory Visit | Attending: Physician Assistant | Admitting: Physician Assistant

## 2023-01-22 DIAGNOSIS — R42 Dizziness and giddiness: Secondary | ICD-10-CM | POA: Insufficient documentation

## 2023-01-22 DIAGNOSIS — C670 Malignant neoplasm of trigone of bladder: Secondary | ICD-10-CM | POA: Diagnosis present

## 2023-01-22 MED ORDER — HEPARIN SOD (PORK) LOCK FLUSH 100 UNIT/ML IV SOLN
500.0000 [IU] | INTRAVENOUS | Status: AC | PRN
  Administered 2023-01-22: 500 [IU]
  Filled 2023-01-22: qty 5

## 2023-01-22 MED ORDER — GADOBUTROL 1 MMOL/ML IV SOLN
7.0000 mL | Freq: Once | INTRAVENOUS | Status: AC | PRN
  Administered 2023-01-22: 7 mL via INTRAVENOUS

## 2023-01-23 ENCOUNTER — Telehealth: Payer: Self-pay

## 2023-01-23 ENCOUNTER — Telehealth: Payer: Self-pay | Admitting: Hematology and Oncology

## 2023-01-23 ENCOUNTER — Other Ambulatory Visit (HOSPITAL_COMMUNITY): Payer: Self-pay | Admitting: Physician Assistant

## 2023-01-23 DIAGNOSIS — C670 Malignant neoplasm of trigone of bladder: Secondary | ICD-10-CM

## 2023-01-23 MED FILL — Dexamethasone Sodium Phosphate Inj 100 MG/10ML: INTRAMUSCULAR | Qty: 1 | Status: AC

## 2023-01-23 NOTE — Telephone Encounter (Signed)
Pt advised with VU 

## 2023-01-23 NOTE — Telephone Encounter (Signed)
-----   Message from Briant Cedar, PA-C sent at 01/22/2023  5:03 PM EDT ----- Please notify patient that MRI brain was negative for metastases ----- Message ----- From: Interface, Rad Results In Sent: 01/22/2023   4:46 PM EDT To: Briant Cedar, PA-C

## 2023-01-24 ENCOUNTER — Inpatient Hospital Stay: Payer: Medicare Other | Admitting: Physician Assistant

## 2023-01-24 ENCOUNTER — Inpatient Hospital Stay: Payer: Medicare Other

## 2023-01-28 ENCOUNTER — Inpatient Hospital Stay: Payer: Medicare Other

## 2023-01-28 ENCOUNTER — Other Ambulatory Visit: Payer: Self-pay

## 2023-01-28 DIAGNOSIS — G893 Neoplasm related pain (acute) (chronic): Secondary | ICD-10-CM

## 2023-01-28 DIAGNOSIS — C679 Malignant neoplasm of bladder, unspecified: Secondary | ICD-10-CM

## 2023-01-28 DIAGNOSIS — Z515 Encounter for palliative care: Secondary | ICD-10-CM

## 2023-01-28 MED ORDER — OXYCODONE HCL 15 MG PO TABS
15.0000 mg | ORAL_TABLET | Freq: Four times a day (QID) | ORAL | 0 refills | Status: DC | PRN
Start: 2023-01-28 — End: 2023-02-10

## 2023-01-28 NOTE — Telephone Encounter (Signed)
Called back Danny Wood to clarify his pain medication refill request.  He is currently doubling up on his immediate release Oxycodone and also taking the MS Contin as prescribed.  This morning his pain was 10/10 on the pain scale and it is occurring in his right leg and entire stomach area.  He states it is miserable and unbearable.  He said he can ease the pain by taking a warm shower and letting the leg get warm water on it and then by also getting in a certain position in the bed.  He said when he is able to get in a certain position, it relieves the pain just enough to get a full breath of air.  He knows that amputation is not the way to go but he needs the pain to stop some way or another as it is unbearable. He would like a call back on what he should do and he needs a refill on his pain medication. Lorayne Marek, RN

## 2023-01-30 ENCOUNTER — Encounter: Payer: Self-pay | Admitting: Nurse Practitioner

## 2023-01-30 ENCOUNTER — Other Ambulatory Visit: Payer: Self-pay | Admitting: *Deleted

## 2023-01-30 ENCOUNTER — Inpatient Hospital Stay (HOSPITAL_BASED_OUTPATIENT_CLINIC_OR_DEPARTMENT_OTHER): Payer: Medicare Other | Admitting: Nurse Practitioner

## 2023-01-30 ENCOUNTER — Other Ambulatory Visit: Payer: Self-pay

## 2023-01-30 ENCOUNTER — Inpatient Hospital Stay (HOSPITAL_BASED_OUTPATIENT_CLINIC_OR_DEPARTMENT_OTHER): Payer: Medicare Other | Admitting: Hematology and Oncology

## 2023-01-30 ENCOUNTER — Inpatient Hospital Stay: Payer: Medicare Other

## 2023-01-30 DIAGNOSIS — R530 Neoplastic (malignant) related fatigue: Secondary | ICD-10-CM

## 2023-01-30 DIAGNOSIS — Z515 Encounter for palliative care: Secondary | ICD-10-CM

## 2023-01-30 DIAGNOSIS — C679 Malignant neoplasm of bladder, unspecified: Secondary | ICD-10-CM

## 2023-01-30 DIAGNOSIS — R634 Abnormal weight loss: Secondary | ICD-10-CM

## 2023-01-30 DIAGNOSIS — Z7189 Other specified counseling: Secondary | ICD-10-CM

## 2023-01-30 DIAGNOSIS — C670 Malignant neoplasm of trigone of bladder: Secondary | ICD-10-CM

## 2023-01-30 DIAGNOSIS — G893 Neoplasm related pain (acute) (chronic): Secondary | ICD-10-CM | POA: Diagnosis not present

## 2023-01-30 DIAGNOSIS — R630 Anorexia: Secondary | ICD-10-CM

## 2023-01-30 DIAGNOSIS — C772 Secondary and unspecified malignant neoplasm of intra-abdominal lymph nodes: Secondary | ICD-10-CM

## 2023-01-30 DIAGNOSIS — Z95828 Presence of other vascular implants and grafts: Secondary | ICD-10-CM

## 2023-01-30 LAB — CBC WITH DIFFERENTIAL (CANCER CENTER ONLY)
Abs Immature Granulocytes: 0.02 10*3/uL (ref 0.00–0.07)
Basophils Absolute: 0 10*3/uL (ref 0.0–0.1)
Basophils Relative: 1 %
Eosinophils Absolute: 0.1 10*3/uL (ref 0.0–0.5)
Eosinophils Relative: 1 %
HCT: 32 % — ABNORMAL LOW (ref 39.0–52.0)
Hemoglobin: 10.6 g/dL — ABNORMAL LOW (ref 13.0–17.0)
Immature Granulocytes: 0 %
Lymphocytes Relative: 8 %
Lymphs Abs: 0.6 10*3/uL — ABNORMAL LOW (ref 0.7–4.0)
MCH: 29.2 pg (ref 26.0–34.0)
MCHC: 33.1 g/dL (ref 30.0–36.0)
MCV: 88.2 fL (ref 80.0–100.0)
Monocytes Absolute: 0.5 10*3/uL (ref 0.1–1.0)
Monocytes Relative: 7 %
Neutro Abs: 5.8 10*3/uL (ref 1.7–7.7)
Neutrophils Relative %: 83 %
Platelet Count: 288 10*3/uL (ref 150–400)
RBC: 3.63 MIL/uL — ABNORMAL LOW (ref 4.22–5.81)
RDW: 13.8 % (ref 11.5–15.5)
WBC Count: 7 10*3/uL (ref 4.0–10.5)
nRBC: 0 % (ref 0.0–0.2)

## 2023-01-30 LAB — CMP (CANCER CENTER ONLY)
ALT: 11 U/L (ref 0–44)
AST: 16 U/L (ref 15–41)
Albumin: 2.7 g/dL — ABNORMAL LOW (ref 3.5–5.0)
Alkaline Phosphatase: 57 U/L (ref 38–126)
Anion gap: 5 (ref 5–15)
BUN: 16 mg/dL (ref 8–23)
CO2: 28 mmol/L (ref 22–32)
Calcium: 7.7 mg/dL — ABNORMAL LOW (ref 8.9–10.3)
Chloride: 104 mmol/L (ref 98–111)
Creatinine: 0.84 mg/dL (ref 0.61–1.24)
GFR, Estimated: >60 mL/min (ref >60)
Glucose, Bld: 163 mg/dL — ABNORMAL HIGH (ref 70–99)
Potassium: 4.3 mmol/L (ref 3.5–5.1)
Sodium: 137 mmol/L (ref 135–145)
Total Bilirubin: 0.2 mg/dL — ABNORMAL LOW (ref 0.3–1.2)
Total Protein: 5.3 g/dL — ABNORMAL LOW (ref 6.5–8.1)

## 2023-01-30 MED ORDER — HYDROMORPHONE HCL 4 MG PO TABS
4.0000 mg | ORAL_TABLET | Freq: Four times a day (QID) | ORAL | 0 refills | Status: DC | PRN
Start: 2023-01-30 — End: 2023-02-10

## 2023-01-30 MED ORDER — METHADONE HCL 5 MG PO TABS
5.0000 mg | ORAL_TABLET | Freq: Three times a day (TID) | ORAL | 0 refills | Status: DC
Start: 2023-01-30 — End: 2023-02-10

## 2023-01-30 MED ORDER — HEPARIN SOD (PORK) LOCK FLUSH 100 UNIT/ML IV SOLN
500.0000 [IU] | Freq: Once | INTRAVENOUS | Status: AC | PRN
  Administered 2023-01-30: 500 [IU]

## 2023-01-30 MED ORDER — SODIUM CHLORIDE 0.9% FLUSH
10.0000 mL | Freq: Once | INTRAVENOUS | Status: AC
  Administered 2023-01-30: 10 mL

## 2023-01-30 MED FILL — Dexamethasone Sodium Phosphate Inj 100 MG/10ML: INTRAMUSCULAR | Qty: 1 | Status: AC

## 2023-01-30 NOTE — Progress Notes (Signed)
Palliative Medicine [facility]  [phone] [phone]   Name: Danny Wood Date: 01/30/2023 MRN: [id number]  DOB: [birth year]  Patient Care Team: Tally Joe, MD as PCP - General (Family Medicine)   INTERVAL HISTORY: Danny Wood is a 72 y.o. male with oncologic medical history including bladder cancer diagnosed in 2015.  He developed stage IV high-grade urothelial carcinoma with pelvic adenopathy in 2019 despite chemotherapy treatments. Recent treatment change in July 2023 due to further progression with chemoradiation. Actively being treated with Sacituzumab.  Palliative ask to see for symptom management and goals of care.   SOCIAL HISTORY:    Danny Wood reports that he quit smoking about 18 years ago. His smoking use included cigarettes. He has a 12.50 pack-year smoking history. He has never used smokeless tobacco. He reports current alcohol use of about 2.0 standard drinks of alcohol per week. He reports that he does not use drugs.  ADVANCE DIRECTIVES: none on file   CODE STATUS: FULL CODE  PAST MEDICAL HISTORY: Past Medical History:  Diagnosis Date   At risk for sleep apnea    STOP-BANG= 4       SENT TO PCP 05-16-2014   bladder ca dx'd 05/17/14   met dz 12/2017   Bladder disorder    Hematuria    History of renal cell carcinoma    2003  S/P  PARTIAL RIGHT NEPHRECTOMY   Presence of surgical incision    lumbar diskectomy 05-11-2014-  bandage present   Urgency of urination     ALLERGIES:  is allergic to dilaudid [hydromorphone].  MEDICATIONS:  Current Outpatient Medications  Medication Sig Dispense Refill   Clotrimazole 1 % LOTN Apply 1 Application topically 2 (two) times daily as needed. 30 mL 0   diphenoxylate-atropine (LOMOTIL) 2.5-0.025 MG tablet 1 to 2 PO QID prn diarrhea 60 tablet 3   furosemide (LASIX) 20 MG tablet Take 2 tablets (40 mg total) by mouth daily. (Patient not taking: Reported on  04/24/2022) 90 tablet 1   gabapentin (NEURONTIN) 300 MG capsule Take 1 capsule (300mg ) by mouth in the morning and afternoon, take 2 caspules (600mg ) at bedtime 60 capsule 0   HYDROmorphone (DILAUDID) 4 MG tablet Take 1 tablet (4 mg total) by mouth every 6 (six) hours as needed for severe pain. 30 tablet 0   loperamide (IMODIUM) 2 MG capsule Take 1 capsule (2 mg total) by mouth as needed for diarrhea or loose stools. 60 capsule 1   magnesium oxide (MAG-OX) 400 (240 Mg) MG tablet Take 1 tablet (400 mg total) by mouth daily. 90 tablet 1   megestrol (MEGACE) 20 MG tablet Take 1 tablet (20 mg total) by mouth 2 (two) times daily. 30 tablet 0   morphine (MS CONTIN) 30 MG 12 hr tablet Take 1 tablet (30 mg total) by mouth every 12 (twelve) hours. 60 tablet 0   nystatin (MYCOSTATIN/NYSTOP) powder Apply 1 Application topically 3 (three) times daily. 30 g 0   ondansetron (ZOFRAN) 8 MG tablet Take 1 tablet (8 mg total) by mouth every 8 (eight) hours as needed for nausea or vomiting. 30 tablet 0   oxyCODONE (ROXICODONE) 15 MG immediate release tablet Take 1 tablet (15 mg total) by mouth every 6 (six) hours as needed for pain. 60 tablet 0   No current facility-administered medications for this visit.    VITAL SIGNS: There were no vitals taken for this visit. There were no vitals filed for this visit.  Estimated body mass index is 22.96 kg/m as calculated from the following:   Height as of 01/15/23: 6\' 2"  (1.88 m).   Weight as of an earlier encounter on 01/30/23: 178 lb 12.8 oz (81.1 kg).   PERFORMANCE STATUS (ECOG) : 1 - Symptomatic but completely ambulatory  Assessment Weak appearing RRR Normal breathing pattern Right leg lymphedema AAO x4, tearful   IMPRESSION:  Danny Wood presents to clinic today for symptom management follow-up. No family present. Endorses fatigue, weakness, pain, and leg swelling. Denies nausea, vomiting, constipation, or diarrhea. Appetite is fair.   Neoplasm related  pain (abdomen) Danny Wood reports his pain continues to be severe. Some improvement with start of hydromorphone several weeks ago. Unfortunately he ran out of his MS Contin and pain has been intense over the past week. We discussed importance of calling prior to running out to prevent gap in medication.   Extensive discussion regarding pain regimen. He feels even when taking MS Contin with breakthrough that his pain continues to be 8-9 out of 10. We discussed increasing MS Contin dosing versus Fentanyl patch. Danny Wood states previous use of Fentanyl patch with minimal to no relief. Education provided on methadone including frequency, efficacy, and potential side effects. He verbalized understanding and would like to try with hopes of better relief. He is aware he may continue with breakthrough medications.   All questions answered and support provided. He understands we will continue to closely monitor and make adjustments to medications as needed.      Diarrhea/constipation Controlled.   3. Goals of Care  We discussed at length patient's current illness and disease trajectory. Danny Wood is emotional with understanding of progression. Emotional support provided. I created space and opportunity to allow him to express his thoughts and feelings. He is emotional speaking of his wife.   Danny Wood is realistic in his understanding however he is not ready to discuss comfort focused care only including hospice. He wishes to continue taking life one day at a time while aggressively managing his symptoms. Knows that symptom burden will continue. Speaks to plans of following up with lymphedema clinic.   We discussed the importance of continued conversation with family and their medical providers regarding overall plan of care and treatment options, ensuring decisions are within the context of the patients values and GOCs.  PLAN:  Methadone 5mg  every 8 hours. Discontinue MS Contin 30 mg  every 12 hours (60 MME) Transitioning to methadone allowing for 25% cross tolerance in divided dose of three. 4.8mg  including (oxycodone=90 MME). Education provided on use of medication.  Hydromorphone 2mg  every 6 hours as needed.  Ongoing goals of care and symptom management as needed. Extensive discussion. Patient is not ready to consider hospice and/or comfort focused care at this time. Goals clearly expressed to treat the treatable, manage symptoms aggressively allowing him every opportunity to thrive for as long as possible.  I will plan to see patient back in 2-3 weeks in collaboration to other oncology appointments. Will follow-up with telephone visit in 1 week.  Patient knows to contact our office if needed sooner.   Patient expressed understanding and was in agreement with this plan. He also understands that he can call the clinic at any time with any questions, concerns, or complaints.   Any controlled substances utilized were prescribed in the context of palliative care. PDMP has been reviewed.    Visit consisted of counseling and education dealing with the complex and emotionally intense issues of symptom management  and palliative care in the setting of serious and potentially life-threatening illness.Greater than 50%  of this time was spent counseling and coordinating care related to the above assessment and plan.    Danny Wood, AGPCNP-BC  Palliative Medicine Team/[facility]  *Please note that this is a verbal dictation therefore any spelling or grammatical errors are due to the "[employer]" system interpretation.
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
## 2023-01-31 ENCOUNTER — Ambulatory Visit: Payer: Medicare Other
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
## 2023-02-03 ENCOUNTER — Ambulatory Visit: Payer: Medicare Other

## 2023-02-03 ENCOUNTER — Ambulatory Visit: Payer: Medicare Other | Attending: Hematology and Oncology | Admitting: Rehabilitation

## 2023-02-03 ENCOUNTER — Encounter: Payer: Self-pay | Admitting: Rehabilitation

## 2023-02-03 ENCOUNTER — Other Ambulatory Visit: Payer: Self-pay

## 2023-02-03 ENCOUNTER — Encounter: Payer: Self-pay | Admitting: Oncology

## 2023-02-03 ENCOUNTER — Encounter: Payer: Self-pay | Admitting: Hematology and Oncology

## 2023-02-03 DIAGNOSIS — C670 Malignant neoplasm of trigone of bladder: Secondary | ICD-10-CM

## 2023-02-03 DIAGNOSIS — C772 Secondary and unspecified malignant neoplasm of intra-abdominal lymph nodes: Secondary | ICD-10-CM | POA: Insufficient documentation

## 2023-02-03 DIAGNOSIS — C679 Malignant neoplasm of bladder, unspecified: Secondary | ICD-10-CM | POA: Diagnosis not present

## 2023-02-03 DIAGNOSIS — I890 Lymphedema, not elsewhere classified: Secondary | ICD-10-CM

## 2023-02-03 MED ORDER — GABAPENTIN 300 MG PO CAPS: ORAL_CAPSULE | ORAL | 0 refills | Status: DC

## 2023-02-04 NOTE — Progress Notes (Deleted)
Palliative Medicine [facility]  [phone] [phone]   Name: Danny Wood Date: 02/04/2023 MRN: [id number]  DOB: March 14, 1951  Patient Care Team: Tally Joe, MD as PCP - General (Family Medicine)   I connected with Danny Wood on 02/04/23 at  2:00 PM EDT by phone and verified that I am speaking with the correct person using two identifiers.   I discussed the limitations, risks, security and privacy concerns of performing an evaluation and management service by telemedicine and the availability of in-person appointments. I also discussed with the patient that there may be a patient responsible charge related to this service. The patient expressed understanding and agreed to proceed.   Other persons participating in the visit and their role in the encounter: n/a   Patient's location: home  Provider's location: [facility]   Chief Complaint: f/u of symptom management   INTERVAL HISTORY: Danny Wood is a 72 y.o. male with oncologic medical history including bladder cancer diagnosed in 2015.  He developed stage IV high-grade urothelial carcinoma with pelvic adenopathy in 2019 despite chemotherapy treatments. Recent treatment change in July 2023 due to further progression with chemoradiation. Actively being treated with Sacituzumab.  Palliative ask to see for symptom management and goals of care.   SOCIAL HISTORY:    Danny Wood reports that he quit smoking about 18 years ago. His smoking use included cigarettes. He has a 12.50 pack-year smoking history. He has never used smokeless tobacco. He reports current alcohol use of about 2.0 standard drinks of alcohol per week. He reports that he does not use drugs.  ADVANCE DIRECTIVES: none on file   CODE STATUS: FULL CODE  PAST MEDICAL HISTORY: Past Medical History:  Diagnosis Date   At risk for sleep apnea    STOP-BANG= 4       SENT TO PCP 05-16-2014   bladder ca dx'd  05/17/14   met dz 12/2017   Bladder disorder    Hematuria    History of renal cell carcinoma    2003  S/P  PARTIAL RIGHT NEPHRECTOMY   Presence of surgical incision    lumbar diskectomy 05-11-2014-  bandage present   Urgency of urination     ALLERGIES:  is allergic to dilaudid [hydromorphone].  MEDICATIONS:  Current Outpatient Medications  Medication Sig Dispense Refill   Clotrimazole 1 % LOTN Apply 1 Application topically 2 (two) times daily as needed. 30 mL 0   diphenoxylate-atropine (LOMOTIL) 2.5-0.025 MG tablet 1 to 2 PO QID prn diarrhea 60 tablet 3   furosemide (LASIX) 20 MG tablet Take 2 tablets (40 mg total) by mouth daily. (Patient not taking: Reported on 04/24/2022) 90 tablet 1   gabapentin (NEURONTIN) 300 MG capsule Take 1 capsule (300mg ) by mouth in the morning and afternoon, take 2 caspules (600mg ) at bedtime 60 capsule 0   HYDROmorphone (DILAUDID) 4 MG tablet Take 1 tablet (4 mg total) by mouth every 6 (six) hours as needed for severe pain. 45 tablet 0   loperamide (IMODIUM) 2 MG capsule Take 1 capsule (2 mg total) by mouth as needed for diarrhea or loose stools. 60 capsule 1   magnesium oxide (MAG-OX) 400 (240 Mg) MG tablet Take 1 tablet (400 mg total) by mouth daily. 90 tablet 1   megestrol (MEGACE) 20 MG tablet Take 1 tablet (20 mg total) by mouth 2 (two) times daily. 30 tablet 0   methadone (DOLOPHINE) 5 MG tablet Take 1 tablet (5 mg total)  by mouth every 8 (eight) hours. 45 tablet 0   nystatin (MYCOSTATIN/NYSTOP) powder Apply 1 Application topically 3 (three) times daily. 30 g 0   ondansetron (ZOFRAN) 8 MG tablet Take 1 tablet (8 mg total) by mouth every 8 (eight) hours as needed for nausea or vomiting. 30 tablet 0   oxyCODONE (ROXICODONE) 15 MG immediate release tablet Take 1 tablet (15 mg total) by mouth every 6 (six) hours as needed for pain. 60 tablet 0   No current facility-administered medications for this visit.    VITAL SIGNS: There were no vitals taken for  this visit. There were no vitals filed for this visit.  Estimated body mass index is 22.96 kg/m as calculated from the following:   Height as of 01/15/23: 6\' 2"  (1.88 m).   Weight as of 01/30/23: 178 lb 12.8 oz (81.1 kg).   PERFORMANCE STATUS (ECOG) : 1 - Symptomatic but completely ambulatory  Assessment Weak appearing RRR Normal breathing pattern Right leg lymphedema AAO x4, tearful   IMPRESSION:   Neoplasm related pain (abdomen) Danny Wood reports his pain continues to be severe. Some improvement with start of hydromorphone several weeks ago. Unfortunately he ran out of his MS Contin and pain has been intense over the past week. We discussed importance of calling prior to running out to prevent gap in medication.   Extensive discussion regarding pain regimen. He feels even when taking MS Contin with breakthrough that his pain continues to be 8-9 out of 10. We discussed increasing MS Contin dosing versus Fentanyl patch. Danny Wood states previous use of Fentanyl patch with minimal to no relief. Education provided on methadone including frequency, efficacy, and potential side effects. He verbalized understanding and would like to try with hopes of better relief. He is aware he may continue with breakthrough medications.   All questions answered and support provided. He understands we will continue to closely monitor and make adjustments to medications as needed.      Diarrhea/constipation Controlled.   3. Goals of Care   01/30/23- We discussed at length patient's current illness and disease trajectory. Danny Wood is emotional with understanding of progression. Emotional support provided. I created space and opportunity to allow him to express his thoughts and feelings. He is emotional speaking of his wife.   [name] is realistic in his understanding however he is not ready to discuss comfort focused care only including hospice. He wishes to continue taking life one day  at a time while aggressively managing his symptoms. Knows that symptom burden will continue. Speaks to plans of following up with lymphedema clinic.   We discussed the importance of continued conversation with family and their medical providers regarding overall plan of care and treatment options, ensuring decisions are within the context of the patients values and GOCs.  PLAN:  Methadone 5mg  every 8 hours. Discontinue MS Contin 30 mg every 12 hours (60 MME) Transitioning to methadone allowing for 25% cross tolerance in divided dose of three. 4.8mg  including (oxycodone=90 MME). Education provided on use of medication.  Hydromorphone 2mg  every 6 hours as needed.  Ongoing goals of care and symptom management as needed. Extensive discussion. Patient is not ready to consider hospice and/or comfort focused care at this time. Goals clearly expressed to treat the treatable, manage symptoms aggressively allowing him every opportunity to thrive for as long as possible.  I will plan to see patient back in 2-3 weeks in collaboration to other oncology appointments. Will follow-up with telephone visit in  1 week.  Patient knows to contact our office if needed sooner.   Patient expressed understanding and was in agreement with this plan. He also understands that he can call the clinic at any time with any questions, concerns, or complaints.   Any controlled substances utilized were prescribed in the context of palliative care. PDMP has been reviewed.    Visit consisted of counseling and education dealing with the complex and emotionally intense issues of symptom management and palliative care in the setting of serious and potentially life-threatening illness.Greater than 50%  of this time was spent counseling and coordinating care related to the above assessment and plan.    Willette Alma, AGPCNP-BC  Palliative Medicine Team/[facility]  *Please note that this is a verbal dictation  therefore any spelling or grammatical errors are due to the "[employer] One" system interpretation.

## 2023-02-05 ENCOUNTER — Inpatient Hospital Stay: Payer: Medicare Other | Admitting: Nurse Practitioner

## 2023-02-06 ENCOUNTER — Ambulatory Visit: Payer: Medicare Other | Admitting: Hematology and Oncology

## 2023-02-06 ENCOUNTER — Ambulatory Visit: Payer: Medicare Other

## 2023-02-06 ENCOUNTER — Other Ambulatory Visit: Payer: Medicare Other

## 2023-02-06 ENCOUNTER — Inpatient Hospital Stay: Payer: Medicare Other | Attending: Oncology | Admitting: Nurse Practitioner

## 2023-02-06 DIAGNOSIS — C772 Secondary and unspecified malignant neoplasm of intra-abdominal lymph nodes: Secondary | ICD-10-CM

## 2023-02-06 DIAGNOSIS — C679 Malignant neoplasm of bladder, unspecified: Secondary | ICD-10-CM | POA: Diagnosis not present

## 2023-02-06 NOTE — Progress Notes (Deleted)
Palliative Medicine [facility]  Telephone:(336) [phone] Fax:(336) [phone]   Name: Danny Wood Date: 02/06/2023 MRN: [id number]  DOB: June 04, 1951  Patient Care Team: Tally Joe, MD as PCP - General (Family Medicine)   I connected with Danny Wood on 02/06/23 at  2:45 PM EDT by phone and verified that I am speaking with the correct person using two identifiers.   I discussed the limitations, risks, security and privacy concerns of performing an evaluation and management service by telemedicine and the availability of in-person appointments. I also discussed with the patient that there may be a patient responsible charge related to this service. The patient expressed understanding and agreed to proceed.   Other persons participating in the visit and their role in the encounter: n/a   Patient's location: home  Provider's location: [facility]   Chief Complaint: f/u of symptom management   INTERVAL HISTORY: Danny Wood is a 72 y.o. male with oncologic medical history including bladder cancer diagnosed in 2015.  He developed stage IV high-grade urothelial carcinoma with pelvic adenopathy in 2019 despite chemotherapy treatments. Recent treatment change in July 2023 due to further progression with chemoradiation. Actively being treated with Sacituzumab.  Palliative ask to see for symptom management and goals of care.   SOCIAL HISTORY:    Danny Wood reports that he quit smoking about 18 years ago. His smoking use included cigarettes. He has a 12.50 pack-year smoking history. He has never used smokeless tobacco. He reports current alcohol use of about 2.0 standard drinks of alcohol per week. He reports that he does not use drugs.  ADVANCE DIRECTIVES: none on file   CODE STATUS: FULL CODE  PAST MEDICAL HISTORY: Past Medical History:  Diagnosis Date   At risk for sleep apnea    STOP-BANG= 4       SENT TO PCP 05-16-2014   bladder ca dx'd  05/17/14   met dz 12/2017   Bladder disorder    Hematuria    History of renal cell carcinoma    2003  S/P  PARTIAL RIGHT NEPHRECTOMY   Presence of surgical incision    lumbar diskectomy 05-11-2014-  bandage present   Urgency of urination     ALLERGIES:  is allergic to dilaudid [hydromorphone].  MEDICATIONS:  Current Outpatient Medications  Medication Sig Dispense Refill   Clotrimazole 1 % LOTN Apply 1 Application topically 2 (two) times daily as needed. 30 mL 0   diphenoxylate-atropine (LOMOTIL) 2.5-0.025 MG tablet 1 to 2 PO QID prn diarrhea 60 tablet 3   furosemide (LASIX) 20 MG tablet Take 2 tablets (40 mg total) by mouth daily. (Patient not taking: Reported on 04/24/2022) 90 tablet 1   gabapentin (NEURONTIN) 300 MG capsule Take 1 capsule (300mg ) by mouth in the morning and afternoon, take 2 caspules (600mg ) at bedtime 60 capsule 0   HYDROmorphone (DILAUDID) 4 MG tablet Take 1 tablet (4 mg total) by mouth every 6 (six) hours as needed for severe pain. 45 tablet 0   loperamide (IMODIUM) 2 MG capsule Take 1 capsule (2 mg total) by mouth as needed for diarrhea or loose stools. 60 capsule 1   magnesium oxide (MAG-OX) 400 (240 Mg) MG tablet Take 1 tablet (400 mg total) by mouth daily. 90 tablet 1   megestrol (MEGACE) 20 MG tablet Take 1 tablet (20 mg total) by mouth 2 (two) times daily. 30 tablet 0   methadone (DOLOPHINE) 5 MG tablet Take 1 tablet (5 mg total)  by mouth every 8 (eight) hours. 45 tablet 0   nystatin (MYCOSTATIN/NYSTOP) powder Apply 1 Application topically 3 (three) times daily. 30 g 0   ondansetron (ZOFRAN) 8 MG tablet Take 1 tablet (8 mg total) by mouth every 8 (eight) hours as needed for nausea or vomiting. 30 tablet 0   oxyCODONE (ROXICODONE) 15 MG immediate release tablet Take 1 tablet (15 mg total) by mouth every 6 (six) hours as needed for pain. 60 tablet 0   No current facility-administered medications for this visit.    VITAL SIGNS: There were no vitals taken for  this visit. There were no vitals filed for this visit.  Estimated body mass index is 22.96 kg/m as calculated from the following:   Height as of 01/15/23: 6\' 2"  (1.88 m).   Weight as of 01/30/23: 178 lb 12.8 oz (81.1 kg).   PERFORMANCE STATUS (ECOG) : 1 - Symptomatic but completely ambulatory  Assessment Weak appearing RRR Normal breathing pattern Right leg lymphedema AAO x4, tearful   IMPRESSION:   Neoplasm related pain (abdomen) Danny Wood reports his pain continues to be severe. Some improvement with start of hydromorphone several weeks ago. Unfortunately he ran out of his MS Contin and pain has been intense over the past week. We discussed importance of calling prior to running out to prevent gap in medication.   Extensive discussion regarding pain regimen. He feels even when taking MS Contin with breakthrough that his pain continues to be 8-9 out of 10. We discussed increasing MS Contin dosing versus Fentanyl patch. Danny Wood states previous use of Fentanyl patch with minimal to no relief. Education provided on methadone including frequency, efficacy, and potential side effects. He verbalized understanding and would like to try with hopes of better relief. He is aware he may continue with breakthrough medications.   All questions answered and support provided. He understands we will continue to closely monitor and make adjustments to medications as needed.      Diarrhea/constipation Controlled.   3. Goals of Care   01/30/23- We discussed at length patient's current illness and disease trajectory. Danny Wood is emotional with understanding of progression. Emotional support provided. I created space and opportunity to allow him to express his thoughts and feelings. He is emotional speaking of his wife.   Danny Wood is realistic in his understanding however he is not ready to discuss comfort focused care only including hospice. He wishes to continue taking life one day  at a time while aggressively managing his symptoms. Knows that symptom burden will continue. Speaks to plans of following up with lymphedema clinic.   We discussed the importance of continued conversation with family and their medical providers regarding overall plan of care and treatment options, ensuring decisions are within the context of the patients values and GOCs.  PLAN:  Methadone 5mg  every 8 hours. Discontinue MS Contin 30 mg every 12 hours (60 MME) Transitioning to methadone allowing for 25% cross tolerance in divided dose of three. 4.8mg  including (oxycodone=90 MME). Education provided on use of medication.  Hydromorphone 2mg  every 6 hours as needed.  Ongoing goals of care and symptom management as needed. Extensive discussion. Patient is not ready to consider hospice and/or comfort focused care at this time. Goals clearly expressed to treat the treatable, manage symptoms aggressively allowing him every opportunity to thrive for as long as possible.  I will plan to see patient back in 2-3 weeks in collaboration to other oncology appointments. Will follow-up with telephone visit in  1 week.  Patient knows to contact our office if needed sooner.   Patient expressed understanding and was in agreement with this plan. He also understands that he can call the clinic at any time with any questions, concerns, or complaints.   Any controlled substances utilized were prescribed in the context of palliative care. PDMP has been reviewed.    Visit consisted of counseling and education dealing with the complex and emotionally intense issues of symptom management and palliative care in the setting of serious and potentially life-threatening illness.Greater than 50%  of this time was spent counseling and coordinating care related to the above assessment and plan.    Willette Alma, AGPCNP-BC  Palliative Medicine Team/[facility]  *Please note that this is a verbal dictation  therefore any spelling or grammatical errors are due to the "Dragon Medical One" system interpretation.

## 2023-02-07 ENCOUNTER — Encounter: Payer: Self-pay | Admitting: Oncology

## 2023-02-07 ENCOUNTER — Encounter: Payer: Self-pay | Admitting: Nurse Practitioner

## 2023-02-07 ENCOUNTER — Telehealth: Payer: Self-pay | Admitting: *Deleted

## 2023-02-07 ENCOUNTER — Encounter: Payer: Self-pay | Admitting: Hematology and Oncology

## 2023-02-07 NOTE — Progress Notes (Signed)
Attempted to contact patient for phone visit multiple times. Unable to reach. Voicemail left.

## 2023-02-07 NOTE — Telephone Encounter (Signed)
Received vm message from pt asking to speak with Nolon Bussing, NP  TCT patient to advise that Lowella Bandy is not in the office today. No answer to call. Lvm message for pt to call back at his convenience to speak with Dr. Derek Mound nurse. Call back # [phone]

## 2023-02-08 ENCOUNTER — Ambulatory Visit: Payer: Medicare Other

## 2023-02-10 ENCOUNTER — Inpatient Hospital Stay (HOSPITAL_BASED_OUTPATIENT_CLINIC_OR_DEPARTMENT_OTHER): Payer: Medicare Other | Admitting: Nurse Practitioner

## 2023-02-10 ENCOUNTER — Other Ambulatory Visit: Payer: Self-pay

## 2023-02-10 ENCOUNTER — Encounter: Payer: Self-pay | Admitting: Nurse Practitioner

## 2023-02-10 DIAGNOSIS — C679 Malignant neoplasm of bladder, unspecified: Secondary | ICD-10-CM

## 2023-02-10 DIAGNOSIS — Z515 Encounter for palliative care: Secondary | ICD-10-CM | POA: Diagnosis not present

## 2023-02-10 DIAGNOSIS — C772 Secondary and unspecified malignant neoplasm of intra-abdominal lymph nodes: Secondary | ICD-10-CM

## 2023-02-10 DIAGNOSIS — G893 Neoplasm related pain (acute) (chronic): Secondary | ICD-10-CM

## 2023-02-10 DIAGNOSIS — Z7189 Other specified counseling: Secondary | ICD-10-CM | POA: Diagnosis not present

## 2023-02-10 MED ORDER — OXYCODONE HCL 15 MG PO TABS
15.0000 mg | ORAL_TABLET | ORAL | 0 refills | Status: DC | PRN
Start: 2023-02-10 — End: 2023-03-14

## 2023-02-10 MED ORDER — METHADONE HCL 10 MG PO TABS
10.0000 mg | ORAL_TABLET | Freq: Three times a day (TID) | ORAL | 0 refills | Status: DC
Start: 2023-02-10 — End: 2023-03-14

## 2023-02-10 NOTE — Progress Notes (Signed)
Palliative Medicine [facility]  Telephone:(336) [phone] Fax:(336) [phone]   Name: Danny Wood Date: 02/10/2023 MRN: [id number]  DOB: 1951-03-12  Patient Care Team: Tally Joe, MD as PCP - General (Family Medicine)   I connected with Danny Wood on 02/10/23 at 12:15 PM EDT by via phone and verified that I am speaking with the correct person using two identifiers.   I discussed the limitations, risks, security and privacy concerns of performing an evaluation and management service by telemedicine and the availability of in-person appointments. I also discussed with the patient that there may be a patient responsible charge related to this service. The patient expressed understanding and agreed to proceed.   Other persons participating in the visit and their role in the encounter: N/A   Patient's location: Home   Provider's location: [facility]   Chief Complaint: Symptom Management    INTERVAL HISTORY: Danny Wood is a 72 y.o. male with oncologic medical history including bladder cancer diagnosed in 2015.  He developed stage IV high-grade urothelial carcinoma with pelvic adenopathy in 2019 despite chemotherapy treatments. Recent treatment change in July 2023 due to further progression with chemoradiation. Actively being treated with Sacituzumab.  Palliative ask to see for symptom management and goals of care.   SOCIAL HISTORY:    Danny Wood reports that he quit smoking about 18 years ago. His smoking use included cigarettes. He has a 12.50 pack-year smoking history. He has never used smokeless tobacco. He reports current alcohol use of about 2.0 standard drinks of alcohol per week. He reports that he does not use drugs.  ADVANCE DIRECTIVES: none on file   CODE STATUS: FULL CODE  PAST MEDICAL HISTORY: Past Medical History:  Diagnosis Date   At risk for sleep apnea    STOP-BANG= 4       SENT TO PCP 05-16-2014    bladder ca dx'd 05/17/14   met dz 12/2017   Bladder disorder    Hematuria    History of renal cell carcinoma    2003  S/P  PARTIAL RIGHT NEPHRECTOMY   Presence of surgical incision    lumbar diskectomy 05-11-2014-  bandage present   Urgency of urination     ALLERGIES:  is allergic to dilaudid [hydromorphone].  MEDICATIONS:  Current Outpatient Medications  Medication Sig Dispense Refill   Clotrimazole 1 % LOTN Apply 1 Application topically 2 (two) times daily as needed. 30 mL 0   diphenoxylate-atropine (LOMOTIL) 2.5-0.025 MG tablet 1 to 2 PO QID prn diarrhea 60 tablet 3   furosemide (LASIX) 20 MG tablet Take 2 tablets (40 mg total) by mouth daily. (Patient not taking: Reported on 04/24/2022) 90 tablet 1   gabapentin (NEURONTIN) 300 MG capsule Take 1 capsule (300mg ) by mouth in the morning and afternoon, take 2 caspules (600mg ) at bedtime 60 capsule 0   loperamide (IMODIUM) 2 MG capsule Take 1 capsule (2 mg total) by mouth as needed for diarrhea or loose stools. 60 capsule 1   magnesium oxide (MAG-OX) 400 (240 Mg) MG tablet Take 1 tablet (400 mg total) by mouth daily. 90 tablet 1   megestrol (MEGACE) 20 MG tablet Take 1 tablet (20 mg total) by mouth 2 (two) times daily. 30 tablet 0   methadone (DOLOPHINE) 10 MG tablet Take 1 tablet (10 mg total) by mouth every 8 (eight) hours. 90 tablet 0   nystatin (MYCOSTATIN/NYSTOP) powder Apply 1 Application topically 3 (three) times daily. 30 g 0  ondansetron (ZOFRAN) 8 MG tablet Take 1 tablet (8 mg total) by mouth every 8 (eight) hours as needed for nausea or vomiting. 30 tablet 0   oxyCODONE (ROXICODONE) 15 MG immediate release tablet Take 1 tablet (15 mg total) by mouth every 4 (four) hours as needed for pain. 90 tablet 0   No current facility-administered medications for this visit.    VITAL SIGNS: There were no vitals taken for this visit. There were no vitals filed for this visit.  Estimated body mass index is 22.96 kg/m as calculated from  the following:   Height as of 01/15/23: 6\' 2"  (1.88 m).   Weight as of 01/30/23: 178 lb 12.8 oz (81.1 kg).   PERFORMANCE STATUS (ECOG) : 1 - Symptomatic but completely ambulatory    IMPRESSION:  I connected by phone with Danny Wood for follow-up on symptom management. He reports ongoing pain and discomfort due to significant leg lymphedema. Denies nausea, vomiting, constipation, or diarrhea. Endorses ongoing fatigue. States he can barely walk due to his pain. Is asking about follow-up with PT for boot to assist with ambulation and pressure. [place], RN communicated with Delice Bison, PT and confirmed Danny Wood has been ordered however delivery may take several weeks.   Neoplasm related pain (abdomen) Danny Wood reports his pain is somewhat better but is still constant. Some improvement with use of methadone. Unfortunately he has run out. Education provided on the importance of maintaining contact and keeping scheduled appointments. He verbalized understanding.   Extensive discussion regarding pain regimen. Sladen is tolerating methadone without difficulty. Pain continues to be constant however with some noticeable difference since starting. Will increase to 10mg . He feels oxycodone works better than hydromorphone. We will again discontinue use and restart. Advise for safety we will not continue to switch back and forth between medications. He verbalized understanding.   All questions answered and support provided. He understands we will continue to closely monitor and make adjustments to medications as needed.      Diarrhea/constipation Controlled.   3. Goals of Care  Danny Wood continues to express he is not accepting of hospice of end-of-life. He wishes to take things one day at a time.   5/30: We discussed at length patient's current illness and disease trajectory. Danny Wood is emotional with understanding of progression. Emotional support provided. I created space and opportunity  to allow him to express his thoughts and feelings. He is emotional speaking of his wife.   Danny Wood is realistic in his understanding however he is not ready to discuss comfort focused care only including hospice. He wishes to continue taking life one day at a time while aggressively managing his symptoms. Knows that symptom burden will continue. Speaks to plans of following up with lymphedema clinic.   We discussed the importance of continued conversation with family and their medical providers regarding overall plan of care and treatment options, ensuring decisions are within the context of the patients values and GOCs.  PLAN:  Methadone 10mg  every 8 hours.  Hydromorphone discontinued patient feels better relief with oxycodone Restart oxycodone 15mg  every 4 hours as needed for breakthrough pain.  Per Diannia Ruder, PT boot has been ordered.  Ongoing goals of care and symptom management as needed. Extensive discussion. Patient is not ready to consider hospice and/or comfort focused care at this time. Goals clearly expressed to treat the treatable, manage symptoms aggressively allowing him every opportunity to thrive for as long as possible.  I will plan to see patient back in 2-3  weeks in collaboration to other oncology appointments.  Patient knows to contact our office if needed sooner.   Patient expressed understanding and was in agreement with this plan. He also understands that he can call the clinic at any time with any questions, concerns, or complaints.   Any controlled substances utilized were prescribed in the context of palliative care. PDMP has been reviewed.    Visit consisted of counseling and education dealing with the complex and emotionally intense issues of symptom management and palliative care in the setting of serious and potentially life-threatening illness.Greater than 50%  of this time was spent counseling and coordinating care related to the above assessment and plan.    Willette Alma, AGPCNP-BC  Palliative Medicine Team/[facility]  *Please note that this is a verbal dictation therefore any spelling or grammatical errors are due to the "Dragon Medical One" system interpretation.

## 2023-02-11 ENCOUNTER — Telehealth: Payer: Self-pay | Admitting: Rehabilitation

## 2023-02-11 NOTE — Telephone Encounter (Signed)
Returned call regarding lymphedema pump update.  Pt was told that they received all of his information as of 02/10/23 and they will start processing ASAP.

## 2023-02-13 ENCOUNTER — Ambulatory Visit: Payer: Medicare Other | Admitting: Hematology and Oncology

## 2023-02-13 ENCOUNTER — Other Ambulatory Visit: Payer: Medicare Other

## 2023-02-14 ENCOUNTER — Ambulatory Visit: Payer: Medicare Other

## 2023-02-14 ENCOUNTER — Telehealth: Payer: Self-pay

## 2023-02-14 NOTE — Telephone Encounter (Signed)
Pt wife called reporting that the patient was in excruciating pain, his leg was swollen to "3x its normal and is hard as a rock" per the wife. RN informed pt wife to call 911 or to go to the ED for pain control, pt wife verbalized understanding and agreement with plan. Pt wife to hang up and call 911, no further needs at this time.

## 2023-02-17 ENCOUNTER — Ambulatory Visit: Payer: Medicare Other

## 2023-02-17 ENCOUNTER — Telehealth: Payer: Self-pay

## 2023-02-17 DIAGNOSIS — C679 Malignant neoplasm of bladder, unspecified: Secondary | ICD-10-CM

## 2023-02-17 DIAGNOSIS — Z7189 Other specified counseling: Secondary | ICD-10-CM

## 2023-02-17 DIAGNOSIS — C670 Malignant neoplasm of trigone of bladder: Secondary | ICD-10-CM

## 2023-02-17 DIAGNOSIS — R530 Neoplastic (malignant) related fatigue: Secondary | ICD-10-CM

## 2023-02-17 NOTE — Telephone Encounter (Signed)
Pt wife called requesting a hospice referral. Referral placed for [facility] hospice services per Dr.Dorsey. this RN spoke with Dr.Dorsey who is in agreement with a referral to hospice. This RN spoke with pt wife Danny Wood who reports that she and her husband, the patient, are requesting hospice services, RN educated on the differences between palliative and hospice care and hospice expectations, [name] verbalized understadning. All questions answered, no further needs at this time. Pt or wife to call with any questions or concerns.

## 2023-02-20 ENCOUNTER — Other Ambulatory Visit: Payer: Medicare Other

## 2023-02-20 ENCOUNTER — Ambulatory Visit: Payer: Medicare Other | Admitting: Hematology and Oncology

## 2023-02-20 ENCOUNTER — Ambulatory Visit: Payer: Medicare Other

## 2023-02-22 ENCOUNTER — Ambulatory Visit: Payer: Medicare Other

## 2023-02-27 ENCOUNTER — Other Ambulatory Visit: Payer: Medicare Other

## 2023-02-27 ENCOUNTER — Ambulatory Visit: Payer: Medicare Other | Admitting: Hematology and Oncology

## 2023-02-27 ENCOUNTER — Ambulatory Visit: Payer: Medicare Other

## 2023-03-01 ENCOUNTER — Ambulatory Visit: Payer: Medicare Other

## 2023-03-07 ENCOUNTER — Other Ambulatory Visit: Payer: Medicare Other

## 2023-03-07 ENCOUNTER — Encounter: Payer: Medicare Other | Admitting: Dietician

## 2023-03-07 ENCOUNTER — Ambulatory Visit: Payer: Medicare Other | Admitting: Hematology and Oncology

## 2023-03-07 ENCOUNTER — Ambulatory Visit: Payer: Medicare Other

## 2023-03-10 ENCOUNTER — Ambulatory Visit: Payer: Medicare Other

## 2023-03-13 ENCOUNTER — Ambulatory Visit: Payer: Medicare Other | Admitting: Hematology and Oncology

## 2023-03-13 ENCOUNTER — Other Ambulatory Visit: Payer: Medicare Other

## 2023-03-13 ENCOUNTER — Encounter: Payer: Medicare Other | Admitting: Dietician

## 2023-03-13 ENCOUNTER — Ambulatory Visit: Payer: Medicare Other

## 2023-03-15 ENCOUNTER — Ambulatory Visit: Payer: Medicare Other

## 2023-04-03 DEATH — deceased

## 2023-07-11 IMAGING — CT CT CHEST-ABD-PELV W/ CM
3 of 8 series · 12 of 46 positions shown, 13 images · IV contrast (APPLIED)
Comparison: Previous imaging from December 05, 2020.

CLINICAL DATA: History of bladder neoplasm. Post cysto
prostatectomy in a 69-year-old male.

EXAM:
CT CHEST, ABDOMEN, AND PELVIS WITH CONTRAST
TECHNIQUE: Multidetector CT imaging of the chest, abdomen and pelvis was
performed following the standard protocol during bolus
administration of intravenous contrast.
CONTRAST:  80mL OMNIPAQUE IOHEXOL 350 MG/ML SOLN

[Series 2: axial post · axial · 0.78mm/px · z∈[-764,-194]mm · 7 of 152 slices shown, 8 images]
[im 19/152  soft-tissue]
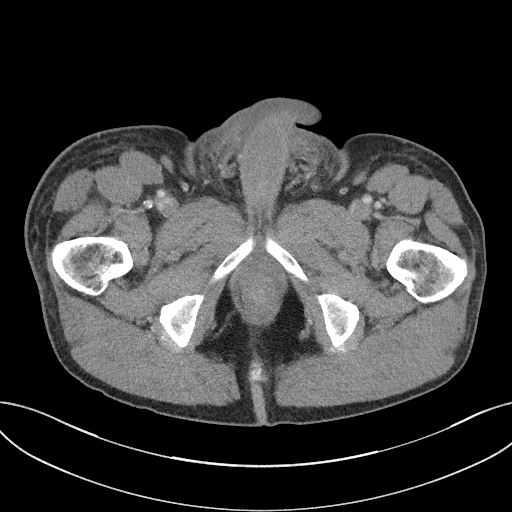
[im 19/152  bone]
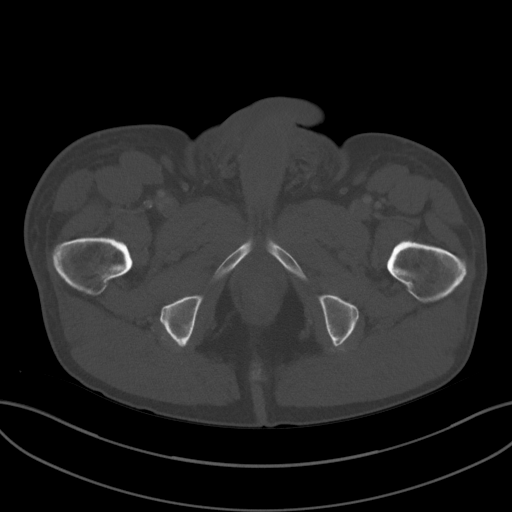
[im 38/152  soft-tissue]
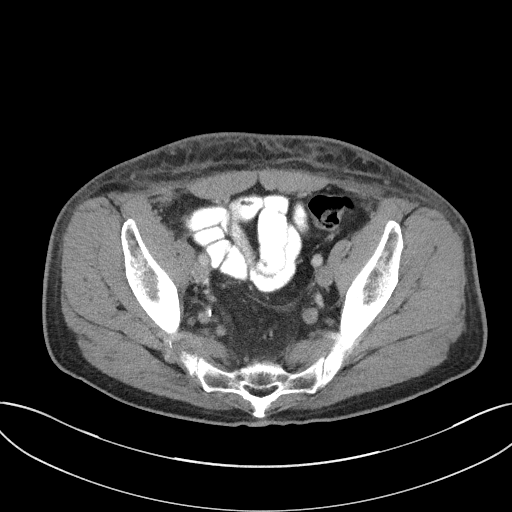
[im 57/152  soft-tissue]
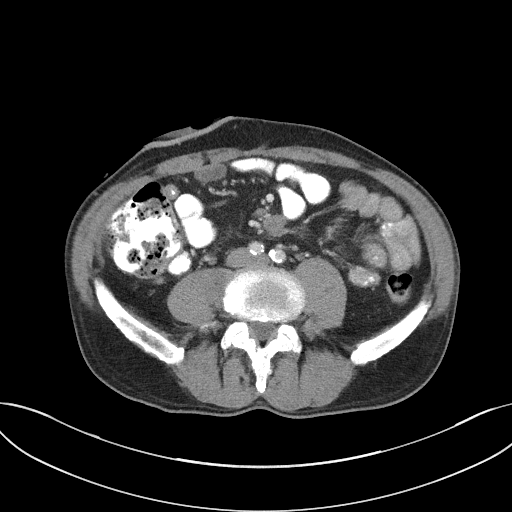
[im 76/152  soft-tissue]
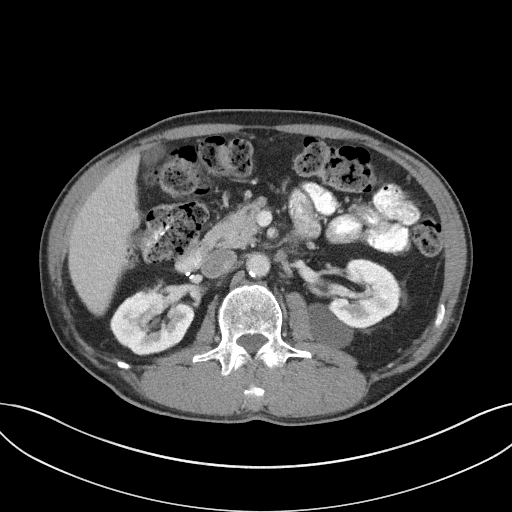
[im 95/152  soft-tissue]
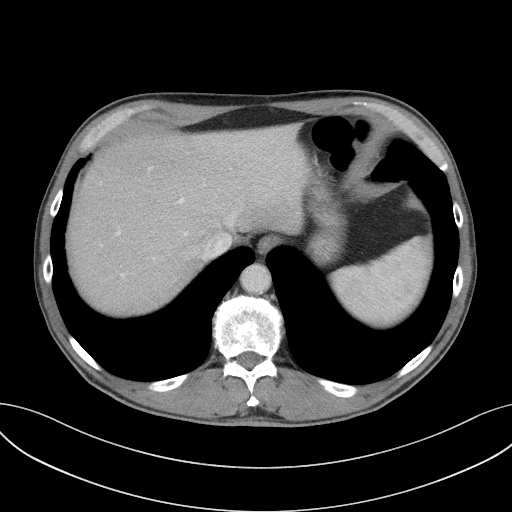
[im 114/152  soft-tissue]
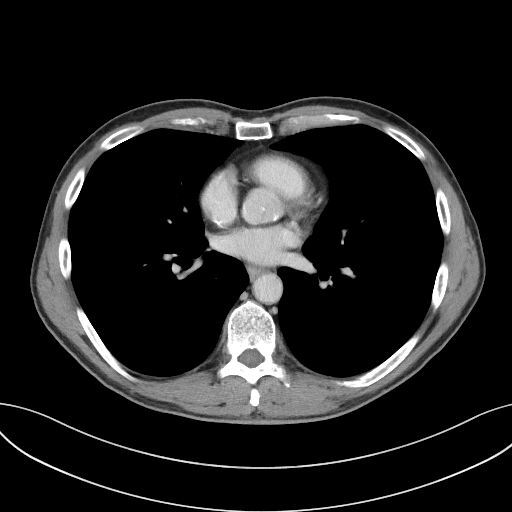
[im 133/152  soft-tissue]
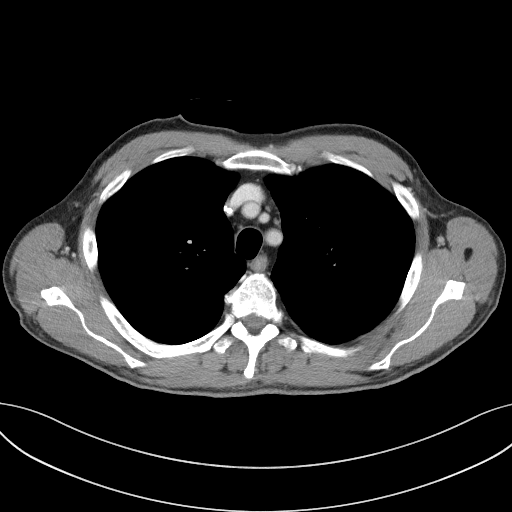

[Series 5: coronal post · coronal · 0.78mm/px · 3 of 99 slices shown]
[im 25/99  soft-tissue]
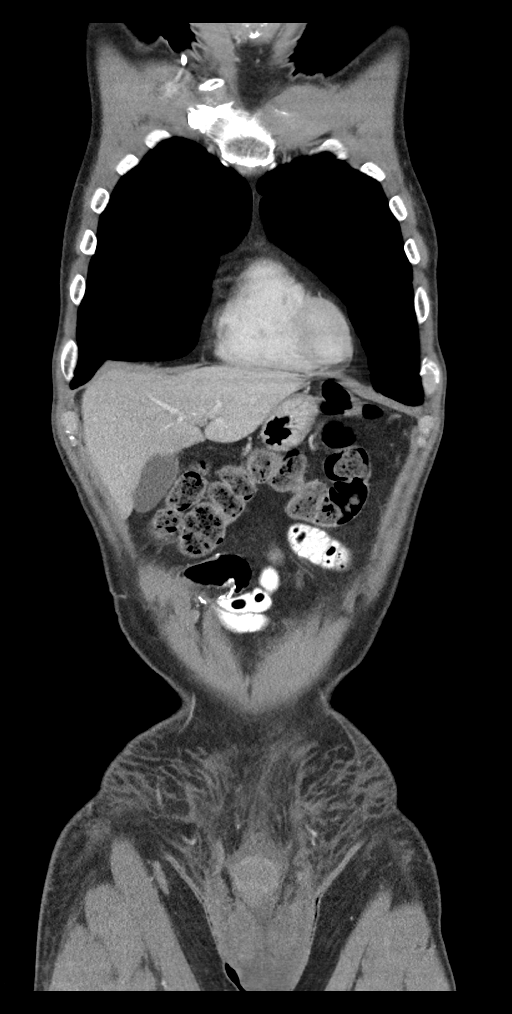
[im 50/99  soft-tissue]
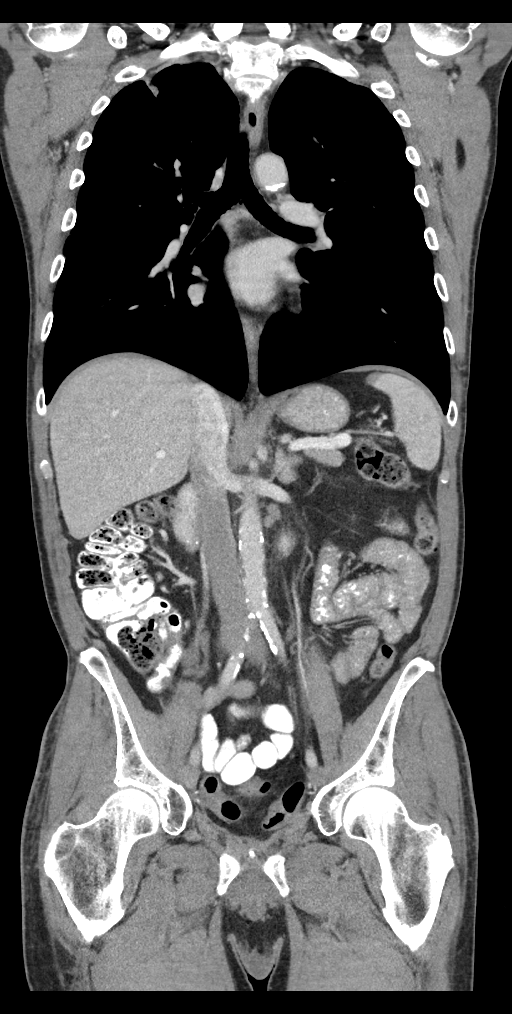
[im 74/99  soft-tissue]
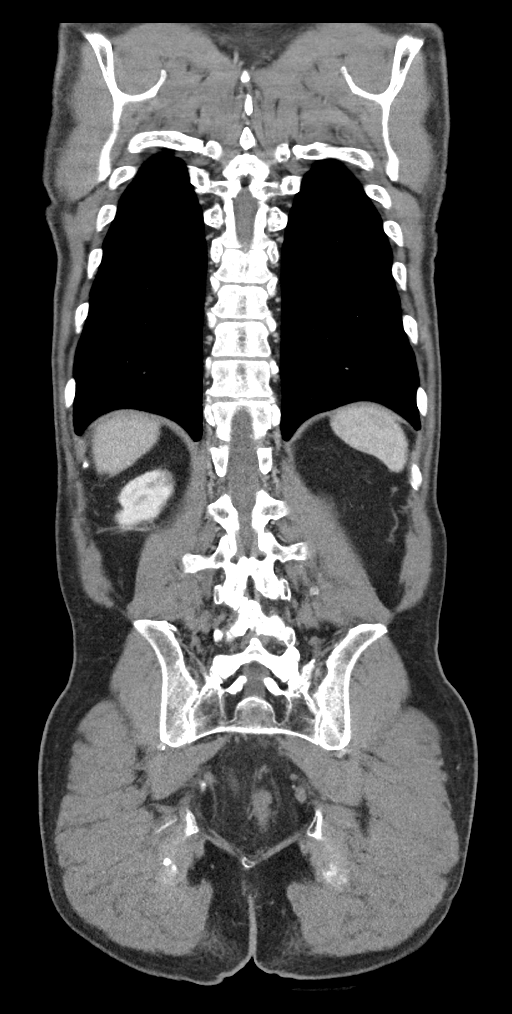

[Series 8: axial delay · axial · delayed · 0.78mm/px · z∈[-799,-704]mm · 2 of 115 slices shown]
[im 20/115  soft-tissue]
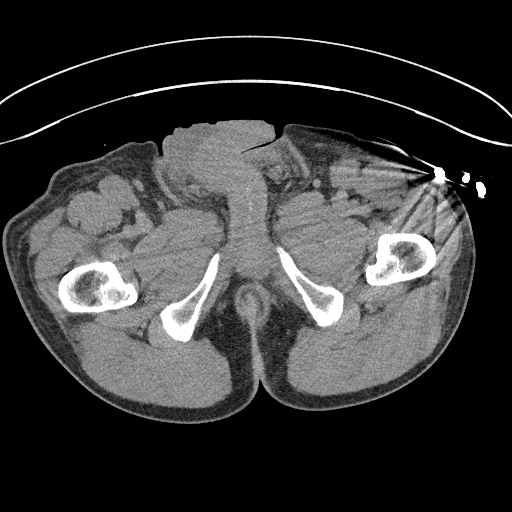
[im 39/115  soft-tissue]
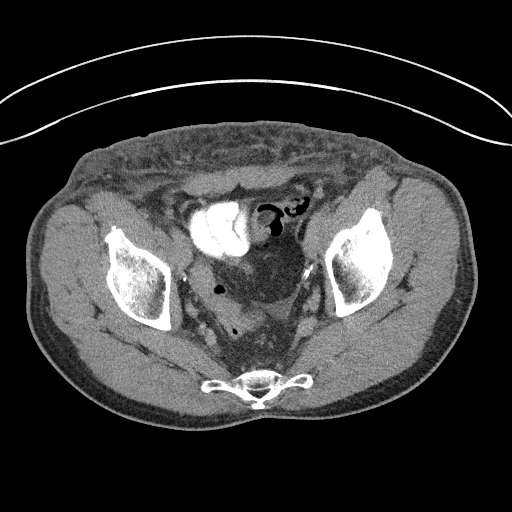

[12 of 46 positions shown; findings below may reference images not displayed]

FINDINGS: CT CHEST FINDINGS

Cardiovascular: RIGHT IJ Port-A-Cath terminates at the caval to
atrial junction. Heart size is normal without substantial
pericardial effusion. Calcified and noncalcified aortic
atherosclerosis without aneurysmal dilation. Central pulmonary
vasculature is normal caliber.

Mediastinum/Nodes: No thoracic inlet, axillary, mediastinal or hilar
adenopathy. Esophagus grossly normal.

Lungs/Pleura: No effusion. No consolidative changes. Airways are
patent.

RIGHT apical nodular area (image [DATE]) this is not changed compared
to imaging dating back to 6568.

Cystic area in the RIGHT upper lobe is no longer visible now with a
14 x 6 mm RIGHT upper lobe nodule in its place this represents a
change since [place]. Other areas of nodular scarring are stable.
There is no effusion. There is no consolidation.

LEFT upper lobe nodularity is unchanged, largest area of nodularity
at 4 mm, favored to represent parenchymal scarring.

Musculoskeletal: See below for full musculoskeletal details.

CT ABDOMEN PELVIS FINDINGS

Hepatobiliary: No focal, suspicious hepatic lesion. No
pericholecystic stranding. No biliary duct dilation. Portal vein is
patent.

Pancreas: Normal, without mass, inflammation or ductal dilatation.

Spleen: Spleen normal size and contour.

Adrenals/Urinary Tract: Adrenal glands are normal.

Cyst arising from the posterior LEFT kidney is unchanged. No
hydronephrosis. Renal cortical scarring on the RIGHT. No suspicious
renal lesion.

Stomach/Bowel: No acute gastrointestinal process. Appendix is
normal. Postoperative changes related to creation of ileal conduit
as before.

Vascular/Lymphatic: Soft tissue at the root of the small bowel
mesentery the slow increase over time. New area of nodularity (image
96/2) 16 x 15 mm not present in Sunday May, 2020.

Area that was present in Sunday May, 2020 measures 2.2 by 1.7 cm
(image 89/2) previously approximately 16 by 13 mm in 6568.
Increasing conspicuity of central mesenteric soft tissue but with
stable appearance of soft tissue immediately adjacent to the celiac
(image 69/2. As compared to imaging from [facility] the areas at the root
of the mesentery which had diminished potentially in size since 6568
are larger, dominant area at 1.1 cm on the most recent prior
currently exceeds 2 cm as outlined above.

No retroperitoneal adenopathy aside from soft tissue about the
celiac which again is similar to the most recent and more remote
priors.

No pelvic adenopathy. Aortic atherosclerosis both calcified and
noncalcified. Smooth contour of the IVC. Portal vein is patent.

Reproductive: Post cysto prostatectomy.

Other: Small volume ascites in the pelvis similar to previous
imaging.

Diffuse edema about the anterior abdominal wall and lower pelvis
extending into the penis and scrotum. No soft tissue gas. Visualized
portions for comparison on previous in imaging not substantially
changed in the short interval perhaps slow increase over time

Musculoskeletal: No acute or destructive bone process. Spinal
degenerative changes.

Excretory phase: No filling defect in the upper tracts. No secondary
signs of stricture.
IMPRESSION: Cystic area in the RIGHT upper lobe is no longer visible now with a
14 x 6 mm RIGHT upper lobe spiculated nodule in its place, this
represents a change since Monday December, 2019. Findings raise the question
of developing bronchogenic neoplasm in this location. PET imaging
may be helpful for further assessment. This would be an atypical
location for metastatic process. Other areas of parenchymal
nodularity are similar.

Waxing and waning soft tissue at the root of the small bowel
mesentery is increased in size compared to more recent studies. This
could also be further assessed with PET imaging but is suspicious
for worsening of disease.

Stable appearance of soft tissue adjacent to the celiac axis.

Diffuse edema about the anterior abdominal wall and lower pelvis
extending into the penis and scrotum. Visualized portions for
comparison on previous in imaging not substantially changed in the
short interval perhaps slow increase over time. More likely
lymphedema given chronicity but would correlate with any signs of
pain or infection.

Small volume ascites in the pelvis similar to previous imaging.

Aortic Atherosclerosis ([id number].H).
